# Patient Record
Sex: Male | Born: 1935 | Race: White | Hispanic: No | Marital: Married | Smoking: Former smoker
Health system: Southern US, Community
[De-identification: ages and names within clinical notes are randomized; demographics above are authoritative.]

## PROBLEM LIST (undated history)

## (undated) DIAGNOSIS — N200 Calculus of kidney: Secondary | ICD-10-CM

## (undated) DIAGNOSIS — Z950 Presence of cardiac pacemaker: Secondary | ICD-10-CM

## (undated) DIAGNOSIS — C61 Malignant neoplasm of prostate: Secondary | ICD-10-CM

## (undated) DIAGNOSIS — I2699 Other pulmonary embolism without acute cor pulmonale: Secondary | ICD-10-CM

## (undated) DIAGNOSIS — R000 Tachycardia, unspecified: Secondary | ICD-10-CM

## (undated) DIAGNOSIS — C419 Malignant neoplasm of bone and articular cartilage, unspecified: Secondary | ICD-10-CM

## (undated) DIAGNOSIS — I10 Essential (primary) hypertension: Secondary | ICD-10-CM

## (undated) DIAGNOSIS — K5790 Diverticulosis of intestine, part unspecified, without perforation or abscess without bleeding: Secondary | ICD-10-CM

## (undated) DIAGNOSIS — K2100 Gastro-esophageal reflux disease with esophagitis, without bleeding: Secondary | ICD-10-CM

## (undated) DIAGNOSIS — I4891 Unspecified atrial fibrillation: Secondary | ICD-10-CM

## (undated) DIAGNOSIS — I480 Paroxysmal atrial fibrillation: Secondary | ICD-10-CM

## (undated) DIAGNOSIS — J309 Allergic rhinitis, unspecified: Secondary | ICD-10-CM

## (undated) DIAGNOSIS — C4490 Unspecified malignant neoplasm of skin, unspecified: Secondary | ICD-10-CM

## (undated) DIAGNOSIS — J449 Chronic obstructive pulmonary disease, unspecified: Secondary | ICD-10-CM

## (undated) DIAGNOSIS — I5030 Unspecified diastolic (congestive) heart failure: Secondary | ICD-10-CM

## (undated) DIAGNOSIS — I714 Abdominal aortic aneurysm, without rupture, unspecified: Secondary | ICD-10-CM

## (undated) DIAGNOSIS — I495 Sick sinus syndrome: Secondary | ICD-10-CM

## (undated) DIAGNOSIS — N400 Enlarged prostate without lower urinary tract symptoms: Secondary | ICD-10-CM

## (undated) DIAGNOSIS — N183 Chronic kidney disease, stage 3 unspecified: Secondary | ICD-10-CM

## (undated) DIAGNOSIS — D131 Benign neoplasm of stomach: Secondary | ICD-10-CM

## (undated) DIAGNOSIS — I2510 Atherosclerotic heart disease of native coronary artery without angina pectoris: Secondary | ICD-10-CM

## (undated) DIAGNOSIS — R001 Bradycardia, unspecified: Secondary | ICD-10-CM

## (undated) DIAGNOSIS — K635 Polyp of colon: Secondary | ICD-10-CM

## (undated) DIAGNOSIS — G4730 Sleep apnea, unspecified: Secondary | ICD-10-CM

## (undated) DIAGNOSIS — K2970 Gastritis, unspecified, without bleeding: Secondary | ICD-10-CM

## (undated) DIAGNOSIS — E7800 Pure hypercholesterolemia, unspecified: Secondary | ICD-10-CM

## (undated) DIAGNOSIS — N189 Chronic kidney disease, unspecified: Secondary | ICD-10-CM

## (undated) DIAGNOSIS — G459 Transient cerebral ischemic attack, unspecified: Secondary | ICD-10-CM

## (undated) DIAGNOSIS — K2270 Barrett's esophagus without dysplasia: Secondary | ICD-10-CM

## (undated) DIAGNOSIS — K219 Gastro-esophageal reflux disease without esophagitis: Secondary | ICD-10-CM

## (undated) HISTORY — DX: Chronic kidney disease, stage 3 unspecified: N18.30

## (undated) HISTORY — DX: Bradycardia, unspecified: R00.1

## (undated) HISTORY — DX: Benign prostatic hyperplasia without lower urinary tract symptoms: N40.0

## (undated) HISTORY — DX: Presence of cardiac pacemaker: Z95.0

## (undated) HISTORY — DX: Abdominal aortic aneurysm, without rupture, unspecified: I71.40

## (undated) HISTORY — DX: Other pulmonary embolism without acute cor pulmonale: I26.99

## (undated) HISTORY — DX: Tachycardia, unspecified: R00.0

## (undated) HISTORY — DX: Paroxysmal atrial fibrillation: I48.0

## (undated) HISTORY — DX: Unspecified diastolic (congestive) heart failure: I50.30

## (undated) HISTORY — DX: Sick sinus syndrome: I49.5

## (undated) HISTORY — PX: CORONARY ANGIOPLASTY: SHX604

## (undated) HISTORY — PX: PROSTATE BIOPSY: SHX241

## (undated) HISTORY — PX: OTHER SURGICAL HISTORY: SHX169

## (undated) HISTORY — PX: CARDIAC CATHETERIZATION: SHX172

---

## 2001-03-21 ENCOUNTER — Inpatient Hospital Stay (HOSPITAL_COMMUNITY): Admission: AD | Admit: 2001-03-21 | Discharge: 2001-03-22 | Payer: Self-pay | Admitting: Cardiology

## 2005-05-18 ENCOUNTER — Other Ambulatory Visit: Payer: Self-pay

## 2005-05-18 ENCOUNTER — Observation Stay: Payer: Self-pay | Admitting: Cardiovascular Disease

## 2005-08-29 ENCOUNTER — Ambulatory Visit: Payer: Self-pay | Admitting: Gastroenterology

## 2006-07-02 ENCOUNTER — Ambulatory Visit: Payer: Self-pay | Admitting: Otolaryngology

## 2009-11-02 ENCOUNTER — Ambulatory Visit: Payer: Self-pay | Admitting: Family Medicine

## 2009-11-10 ENCOUNTER — Ambulatory Visit: Payer: Self-pay | Admitting: Cardiovascular Disease

## 2009-12-02 ENCOUNTER — Ambulatory Visit: Payer: Self-pay | Admitting: Cardiovascular Disease

## 2009-12-21 ENCOUNTER — Ambulatory Visit: Payer: Self-pay | Admitting: Surgery

## 2009-12-24 ENCOUNTER — Observation Stay: Payer: Self-pay | Admitting: Internal Medicine

## 2010-07-08 ENCOUNTER — Ambulatory Visit: Payer: Self-pay | Admitting: Internal Medicine

## 2011-03-07 HISTORY — PX: EYE SURGERY: SHX253

## 2011-05-17 ENCOUNTER — Ambulatory Visit: Payer: Self-pay | Admitting: Ophthalmology

## 2011-05-30 ENCOUNTER — Ambulatory Visit: Payer: Self-pay | Admitting: Ophthalmology

## 2011-07-04 ENCOUNTER — Ambulatory Visit: Payer: Self-pay | Admitting: Ophthalmology

## 2011-12-05 ENCOUNTER — Emergency Department: Payer: Self-pay | Admitting: Emergency Medicine

## 2011-12-05 LAB — COMPREHENSIVE METABOLIC PANEL
Albumin: 3.9 g/dL (ref 3.4–5.0)
Alkaline Phosphatase: 55 U/L (ref 50–136)
Anion Gap: 12 (ref 7–16)
Bilirubin,Total: 0.4 mg/dL (ref 0.2–1.0)
Calcium, Total: 9 mg/dL (ref 8.5–10.1)
Chloride: 108 mmol/L — ABNORMAL HIGH (ref 98–107)
Creatinine: 1.18 mg/dL (ref 0.60–1.30)
EGFR (African American): >60
EGFR (Non-African Amer.): >60
Glucose: 106 mg/dL — ABNORMAL HIGH (ref 65–99)
Osmolality: 285 (ref 275–301)
Potassium: 3.9 mmol/L (ref 3.5–5.1)
Sodium: 142 mmol/L (ref 136–145)

## 2011-12-05 LAB — URINALYSIS, COMPLETE
Bacteria: NONE SEEN
Glucose,UR: NEGATIVE mg/dL (ref 0–75)
Leukocyte Esterase: NEGATIVE
Nitrite: NEGATIVE
Protein: NEGATIVE
RBC,UR: 1 /HPF (ref 0–5)
Specific Gravity: 1.009 (ref 1.003–1.030)
WBC UR: NONE SEEN /HPF (ref 0–5)

## 2011-12-05 LAB — CBC
HCT: 39.3 % — ABNORMAL LOW (ref 40.0–52.0)
Platelet: 193 10*3/uL (ref 150–440)
RDW: 13.4 % (ref 11.5–14.5)
WBC: 11.2 10*3/uL — ABNORMAL HIGH (ref 3.8–10.6)

## 2012-07-10 ENCOUNTER — Ambulatory Visit: Payer: Self-pay | Admitting: Family Medicine

## 2013-09-02 DIAGNOSIS — Z85828 Personal history of other malignant neoplasm of skin: Secondary | ICD-10-CM | POA: Insufficient documentation

## 2013-09-15 ENCOUNTER — Inpatient Hospital Stay: Payer: Self-pay | Admitting: Family Medicine

## 2013-09-15 LAB — PRO B NATRIURETIC PEPTIDE: B-Type Natriuretic Peptide: 36 pg/mL (ref 0–450)

## 2013-09-15 LAB — COMPREHENSIVE METABOLIC PANEL
ALK PHOS: 44 U/L — AB
ANION GAP: 10 (ref 7–16)
Albumin: 3.5 g/dL (ref 3.4–5.0)
BUN: 18 mg/dL (ref 7–18)
Bilirubin,Total: 0.5 mg/dL (ref 0.2–1.0)
CALCIUM: 8.9 mg/dL (ref 8.5–10.1)
CHLORIDE: 108 mmol/L — AB (ref 98–107)
CO2: 23 mmol/L (ref 21–32)
Creatinine: 1.31 mg/dL — ABNORMAL HIGH (ref 0.60–1.30)
EGFR (African American): >60
EGFR (Non-African Amer.): 53 — ABNORMAL LOW
GLUCOSE: 136 mg/dL — AB (ref 65–99)
Osmolality: 285 (ref 275–301)
Potassium: 3.9 mmol/L (ref 3.5–5.1)
SGOT(AST): 37 U/L (ref 15–37)
SGPT (ALT): 28 U/L (ref 12–78)
Sodium: 141 mmol/L (ref 136–145)
TOTAL PROTEIN: 6.7 g/dL (ref 6.4–8.2)

## 2013-09-15 LAB — CBC
HCT: 39.3 % — ABNORMAL LOW (ref 40.0–52.0)
HGB: 13.2 g/dL (ref 13.0–18.0)
MCH: 29.4 pg (ref 26.0–34.0)
MCHC: 33.5 g/dL (ref 32.0–36.0)
MCV: 88 fL (ref 80–100)
PLATELETS: 173 10*3/uL (ref 150–440)
RBC: 4.48 10*6/uL (ref 4.40–5.90)
RDW: 13.6 % (ref 11.5–14.5)
WBC: 5.5 10*3/uL (ref 3.8–10.6)

## 2013-09-15 LAB — TROPONIN I
Troponin-I: <0.02 ng/mL
Troponin-I: <0.02 ng/mL
Troponin-I: <0.02 ng/mL

## 2013-09-15 LAB — APTT: Activated PTT: 35.4 secs (ref 23.6–35.9)

## 2013-09-15 LAB — LIPASE, BLOOD: Lipase: 241 U/L (ref 73–393)

## 2013-09-16 LAB — CBC WITH DIFFERENTIAL/PLATELET
Basophil #: 0 10*3/uL (ref 0.0–0.1)
Basophil %: 0.7 %
Eosinophil #: 0.2 10*3/uL (ref 0.0–0.7)
Eosinophil %: 2.9 %
HCT: 37 % — ABNORMAL LOW (ref 40.0–52.0)
HGB: 12.4 g/dL — ABNORMAL LOW (ref 13.0–18.0)
LYMPHS PCT: 22.6 %
Lymphocyte #: 1.2 10*3/uL (ref 1.0–3.6)
MCH: 29.5 pg (ref 26.0–34.0)
MCHC: 33.5 g/dL (ref 32.0–36.0)
MCV: 88 fL (ref 80–100)
Monocyte #: 0.6 x10 3/mm (ref 0.2–1.0)
Monocyte %: 11 %
NEUTROS ABS: 3.3 10*3/uL (ref 1.4–6.5)
NEUTROS PCT: 62.8 %
PLATELETS: 150 10*3/uL (ref 150–440)
RBC: 4.21 10*6/uL — AB (ref 4.40–5.90)
RDW: 13.8 % (ref 11.5–14.5)
WBC: 5.3 10*3/uL (ref 3.8–10.6)

## 2013-09-16 LAB — BASIC METABOLIC PANEL
Anion Gap: 7 (ref 7–16)
BUN: 20 mg/dL — ABNORMAL HIGH (ref 7–18)
CHLORIDE: 109 mmol/L — AB (ref 98–107)
Calcium, Total: 8.3 mg/dL — ABNORMAL LOW (ref 8.5–10.1)
Co2: 24 mmol/L (ref 21–32)
Creatinine: 1.28 mg/dL (ref 0.60–1.30)
EGFR (African American): >60
EGFR (Non-African Amer.): 54 — ABNORMAL LOW
GLUCOSE: 101 mg/dL — AB (ref 65–99)
Osmolality: 282 (ref 275–301)
Potassium: 4.2 mmol/L (ref 3.5–5.1)
Sodium: 140 mmol/L (ref 136–145)

## 2013-09-16 LAB — LIPID PANEL
Cholesterol: 103 mg/dL (ref 0–200)
HDL: 30 mg/dL — AB (ref 40–60)
Ldl Cholesterol, Calc: 54 mg/dL (ref 0–100)
Triglycerides: 95 mg/dL (ref 0–200)
VLDL CHOLESTEROL, CALC: 19 mg/dL (ref 5–40)

## 2013-10-02 DIAGNOSIS — Z955 Presence of coronary angioplasty implant and graft: Secondary | ICD-10-CM | POA: Insufficient documentation

## 2013-10-10 ENCOUNTER — Emergency Department: Payer: Self-pay | Admitting: Emergency Medicine

## 2013-10-21 ENCOUNTER — Ambulatory Visit: Payer: Self-pay | Admitting: Gastroenterology

## 2013-10-21 DIAGNOSIS — K2270 Barrett's esophagus without dysplasia: Secondary | ICD-10-CM

## 2013-10-21 HISTORY — DX: Barrett's esophagus without dysplasia: K22.70

## 2013-10-22 LAB — PATHOLOGY REPORT

## 2014-02-12 DIAGNOSIS — J00 Acute nasopharyngitis [common cold]: Secondary | ICD-10-CM | POA: Insufficient documentation

## 2014-06-18 DIAGNOSIS — Z7189 Other specified counseling: Secondary | ICD-10-CM | POA: Insufficient documentation

## 2014-06-27 NOTE — Consult Note (Signed)
PATIENT NAME:  Nathaniel Thompson, Nathaniel Thompson MR#:  [id number] DATE OF BIRTH:  1935/05/16  DATE OF CONSULTATION:  09/15/2013.  CONSULTING PHYSICIAN:  Dionisio David, MD  INDICATION FOR CONSULTATION: Chest pain.   HISTORY OF PRESENT ILLNESS: This is a 79 year old white male with a past medical history of PCI and stenting at [facility] in 2004.  He presented with all of a sudden severe pressure-type chest pain this morning associated with shortness of breath and diaphoresis. He was given IV nitroglycerin and sublingual nitroglycerin. The chest pain has resolved. He is feeling much better. He is currently on 5 mcg of IV nitroglycerin. He felt there was also some belching and some epigastric pain, along with feeling like he had gas, but all these symptoms have resolved right now. There was concern because he had had PCI and stenting in 2004. Currently looks quite stable.   PAST MEDICAL HISTORY: As mentioned, he has history of PCI and stenting, history of hypertension and history of hyperlipidemia and GI reflux.   SOCIAL HISTORY: Unremarkable.   FAMILY HISTORY: Positive for coronary artery disease.   MEDICATIONS: Simvastatin 20 mg, Protonix once a day, clopidogrel 75 mg once a day, aspirin 81 mg once a day.   PHYSICAL EXAMINATION:  GENERAL: He is alert, oriented x3, in no acute distress right now.  VITAL SIGNS: Blood pressure is 117/65, respirations 18, pulse 53, temperature 98.4.  HEENT: No carotid bruit. No significant JVD.  LUNGS: Good air entry. No rales or rhonchi.  HEART: Regular rate and rhythm, bradycardic. No audible murmur.  ABDOMEN: Soft, nontender, positive bowel sounds.  EXTREMITIES: No pedal edema.  NEUROLOGIC: The patient appears to be intact.   EKG shows sinus rhythm with sinus bradycardia, 56 beats per minute, nonspecific ST-T changes.   LABORATORY DATA: First troponin is 0.02. BUN 1.31, creatinine 1.31, BUN 18, potassium is normal.   ASSESSMENT AND PLAN: The patient has  atypical chest pain. He was given a GI cocktail along with Protonix, myocardial infarction has been ruled out with troponin being negative. EKG unremarkable. Advise discontinuation of IV nitroglycerin, transfer to telemetry, put him on 1 inch of Nitro Paste q.6 hours, cycle the cardiac enzymes 3 times.  Right now there is no indication to do cardiac catheterization. Advise changing Protonix to twice a day. We will see how he is doing in the morning. He may need an outpatient stress test.   Thank you very much for the referral    ____________________________ Dionisio David, MD sak:lt D: 09/15/2013 12:56:54 ET T: 09/15/2013 14:23:31 ET JOB#: [id number]  cc: Dionisio David, MD, <Dictator> Dionisio David MD ELECTRONICALLY SIGNED 09/17/2013 9:22

## 2014-06-27 NOTE — Discharge Summary (Signed)
PATIENT NAME:  Nathaniel Thompson, Nathaniel Thompson MR#:  [id number] DATE OF BIRTH:  23-Aug-1935  DATE OF ADMISSION:  09/15/2013 DATE OF DISCHARGE:  09/16/2013  DISCHARGE DIAGNOSES:  1.  Atypical chest pain.  2.  History of coronary artery disease.  3.  Hypertension.   DISCHARGE MEDICATIONS:  1.  Aspirin 81 mg p.o. daily.  2.  Vytorin 10/20 one tab p.o. daily.  3.  Amlodipine 5 mg p.o. daily.  4.  Benazepril 40 mg p.o. daily.  5.  Flonase 50 mcg 2 sprays each nostril daily as needed.  6.  Multivitamin 1 tab p.o. daily.  7.  Plavix 75 mg p.o. daily.  8.  Nitroglycerin 0.4 mg sublingual q. 5 minutes as needed for chest pain.  9.  Cephalexin 500 mg p.o. b.i.d. as directed. 10.  Pantoprazole 40 mg p.o. b.i.d.  11.  The patient may also take Zantac 150 mg p.o. p.r.n. daily as needed for breakthrough reflux.   CONSULTS:  Dr. Humphrey Rolls, Cardiology.   PROCEDURES: None.   PERTINENT LABS AND STUDIES PRIOR TO DISCHARGE: Sodium 140, potassium 4.2, creatinine 1.28, glucose of 101, LDL 54. Troponins were negative x 3. White blood cell count 5.3, hemoglobin 12.4, and platelets 150,000.    Chest x-ray showed no acute processes.   BRIEF HOSPITAL COURSE:  1.  Atypical chest pain: The patient initially came in complaining of chest discomfort with history of coronary artery disease and PCI done in 2004. His cardiac enzymes were negative. His EKG showed no acute changes. His symptoms improved with a GI cocktail and nitroglycerin.  This is less likely cardiac in nature, but more likely GI-related.  Possibly control reflux versus esophageal vasospasms.  I am going to recommend that he get established with Dr. Rayann Heman at [facility] per outpatient therapy and evaluation. He also has follow-up with Dr. Humphrey Rolls tomorrow a 8:30 for a Cardiolite stress test, given his history of coronary artery disease.   2.  Other chronic issues are stable at this time. Continue his home regimen. He is coming n.p.o. after midnight for the procedure tomorrow.    DISPOSITION: He is stable condition to be discharged to home. Follow-up with Dr. Humphrey Rolls tomorrow as planned, follow up with Dr. Netty Starring within 10 days, and will need a referral to Dr. Rayann Heman as an outpatient possibly later this week.    ____________________________ Dion Body, MD kl:ts D: 09/16/2013 10:28:14 ET T: 09/16/2013 11:17:29 ET JOB#: [id number]  cc: Dion Body, MD, <Dictator> Dion Body MD ELECTRONICALLY SIGNED 09/28/2013 8:06

## 2014-06-27 NOTE — H&P (Signed)
PATIENT NAME:  Nathaniel Thompson, Nathaniel Thompson MR#:  [id number] DATE OF BIRTH:  1935/07/21  DATE OF ADMISSION:  09/15/2013  PRIMARY CARE PHYSICIAN:  Dion Body, MD  CARDIOLOGIST: Dionisio David, MD  CHIEF COMPLAINT: Chest pain.   HISTORY OF PRESENT ILLNESS: This is a 79 year old man who woke up in the morning hurting in his chest. He did belch quite a bit. It got worse and worse, a pressure feeling, 7 out of 10 in intensity, radiating to his back. No radiation to the neck or arm. He was sweating and clammy, also short of breath and nauseous. EMS gave him aspirin and nitroglycerin. In the ER, he received morphine and nitroglycerin patch and then was started on a nitroglycerin drip which relieved his pain. Currently, he had no pain. Hospitalist services were contacted for further evaluation.   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   PAST SURGICAL HISTORY: Skin cancer on the nose and arm removed, cataract surgery.   ALLERGIES: MEDICATION FOR ALLERGIES.   MEDICATIONS: Include aspirin 81 mg daily, Norvasc 5 mg daily, benazepril 40 mg daily, cephalexin 500 mg twice a day, Plavix 75 mg daily, Flonase 50 mcg/inhalations 2 sprays nasally once a day, multivitamin 1 tablet daily, nitroglycerin p.r.n., Protonix 40 mg daily, Vytorin 10/40, he does use an albuterol inhaler.   SOCIAL HISTORY: No smoking. No alcohol. No drug use. 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   FAMILY HISTORY: Father died of multiple myeloma. Mother died of complications. She did have lung cancer, status post lobectomy and aneurysm surgery. Brother died of leukemia. A sister has CLL. Brother living with a tumor on the kidney, lung cancer and chronic obstructive pulmonary disease.   REVIEW OF SYSTEMS:  CONSTITUTIONAL: Positive for sweats. No fever. No chills.  Fatigues easily.  EYES: He wears reading glasses.  EARS, NOSE, MOUTH AND THROAT: Decreased hearing and wears hearing aids. No sore throat. No difficulty swallowing.  CARDIOVASCULAR: Positive for chest pain. No palpitations.  RESPIRATORY: Positive for shortness of breath. Positive for cough. No sputum. No hemoptysis.  GASTROINTESTINAL: Positive for nausea. No vomiting. No abdominal pain. No diarrhea. No constipation. No bright red blood per rectum. No melena.  GENITOURINARY: No burning on urination. No hematuria.  MUSCULOSKELETAL: Positive for joint pain.  INTEGUMENTARY: Positive for itching on the left shoulder blade occasionally.  PSYCHIATRIC: No anxiety or depression.  ENDOCRINE: No thyroid problems.  HEMATOLOGIC AND LYMPHATIC: No anemia, no easy bruising or bleeding.   PHYSICAL EXAMINATION: VITAL SIGNS: On presentation, temperature of 98.5, pulse 56, respirations 20, blood pressure 119/69, pulse oximetry 100% on room air.  GENERAL: No respiratory distress.  EYES: Conjunctivae and lids normal. Pupils equal, round and reactive to light. Extraocular muscles intact. No nystagmus.  EARS, NOSE, MOUTH AND THROAT: Tympanic membranes: No erythema. Nasal mucosa: No erythema. Throat: No erythema, no exudate seen. Lips and gums: No lesions.  NECK: No JVD. No bruits. No lymphadenopathy. No thyromegaly. No thyroid nodules palpated.  RESPIRATORY:  Lungs clear to auscultation. No use of accessory muscles to breathe. No rhonchi, rales or wheeze heard.  CARDIOVASCULAR: S1, S2 normal. No gallops, rubs or murmurs heard. Carotid upstroke 2+ bilaterally. No bruits. Dorsalis pedis pulses 2+ bilaterally. No edema of the lower extremity.  ABDOMEN: Soft, nontender. No organomegaly/splenomegaly. Normoactive bowel sounds. No masses felt.  LYMPHATIC: No lymph nodes in  the neck.  MUSCULOSKELETAL: No clubbing, edema or cyanosis.  SKIN: No rashes or ulcers seen.  NEUROLOGIC: Cranial nerves II through XII grossly intact.  Deep tendon reflexes 2+ bilateral lower extremities.  PSYCHIATRIC: The patient is oriented to person, place and time.   LABORATORY, DIAGNOSTIC AND RADIOLOGICAL DATA:  Troponin negative. White blood cell count 5.5, hemoglobin and hematocrit 13.2 and 39.3, platelet count 173. Glucose 136, BUN 18, creatinine 1.31, sodium 141, potassium 3.9, chloride 108, CO2 23, calcium 8.9. Liver function tests normal range. BNP 36, lipase 241. Chest x-ray no acute abnormalities.   ASSESSMENT AND PLAN: 1.  Unstable angina with chest pain relieved with nitroglycerin drip. Since the patient is on a nitroglycerin drip, I will have to admit to the Critical Care Unit. I will get serial cardiac enzymes to rule out myocardial infarction, put on telemetry. ER physician spoke with Dr. Humphrey Rolls for consideration for cardiac catheterization.  I did give the patient the gastrointestinal cocktail and continued Protonix just in case this pain is gastrointestinal in nature. No beta blocker with relative bradycardia. The patient is already on Vytorin, aspirin and Plavix.  2.  Hypertension. Blood pressure is stable on the nitroglycerin drip. Will hold Norvasc and benazepril at this point in time.  3.  Gastroesophageal reflux disease, on Protonix.  4.  Recent skin cancer surgery, on cephalexin, will continue.   TIME SPENT ON DISCHARGE: 55 minutes. The patient is a DO NOT RESUSCITATE.    ____________________________ Tana Conch. Leslye Peer, MD rjw:cs D: 09/15/2013 15:33:25 ET T: 09/15/2013 15:52:13 ET JOB#: [id number]  cc: Tana Conch. Leslye Peer, MD, <Dictator> Marisue Brooklyn MD ELECTRONICALLY SIGNED 09/18/2013 12:40

## 2014-06-28 NOTE — Op Note (Signed)
PATIENT NAME:  Nathaniel Thompson, Nathaniel Thompson MR#:  [id number] DATE OF BIRTH:  [birth year]  DATE OF PROCEDURE:  05/30/2011  PREOPERATIVE DIAGNOSIS: Visually significant cataract of the left eye.   POSTOPERATIVE DIAGNOSIS: Visually significant cataract of the left eye.   OPERATIVE PROCEDURE: Cataract extraction by phacoemulsification with implant of intraocular lens to left eye.   SURGEON: Birder Robson, MD.   ANESTHESIA:  1. Managed anesthesia care.  2. Topical tetracaine drops followed by 2% Xylocaine jelly applied in the preoperative holding area.   COMPLICATIONS: None.   TECHNIQUE:  Stop and chop.   DESCRIPTION OF PROCEDURE: The patient was examined and consented in the preoperative holding area where the aforementioned topical anesthesia was applied to the left eye and then brought back to the Operating Room where the left eye was prepped and draped in the usual sterile ophthalmic fashion and a lid speculum was placed. A paracentesis was created with the side port blade and the anterior chamber was filled with viscoelastic. A near clear corneal incision was performed with the steel keratome. A continuous curvilinear capsulorrhexis was performed with a cystotome followed by the capsulorrhexis forceps. Hydrodissection and hydrodelineation were carried out with BSS on a blunt cannula. The lens was removed in a stop and chop technique and the remaining cortical material was removed with the irrigation-aspiration handpiece. The capsular bag was inflated with viscoelastic and the Tecnis ZCB00 21.5-diopter lens, serial number [id number] was placed in the capsular bag without complication. The remaining viscoelastic was removed from the eye with the irrigation-aspiration handpiece. The wounds were hydrated. The anterior chamber was flushed with Miostat and the eye was inflated to physiologic pressure. The wounds were found to be water tight. The eye was dressed with Vigamox. The patient was given protective  glasses to wear throughout the day and a shield with which to sleep tonight. The patient was also given drops with which to begin a drop regimen today and will follow-up with me in one day.  ____________________________ Livingston Diones. Breionna Punt, MD wlp:slb D: 05/30/2011 12:25:45 ET T: 05/30/2011 13:11:34 ET JOB#: [id number]  cc: Irish Piech L. Sheyanne Munley, MD, <Dictator> Livingston Diones Lynix Bonine MD ELECTRONICALLY SIGNED 06/09/2011 20:56

## 2014-06-28 NOTE — Op Note (Signed)
PATIENT NAME:  Nathaniel Thompson, Nathaniel Thompson MR#:  [id number] DATE OF BIRTH:  Oct 09, 1935  DATE OF PROCEDURE:  07/04/2011  PREOPERATIVE DIAGNOSIS: Visually significant cataract of the right eye.   POSTOPERATIVE DIAGNOSIS: Visually significant cataract of the right eye.   OPERATIVE PROCEDURE: Cataract extraction by phacoemulsification with implant of intraocular lens to right eye.   SURGEON: Birder Robson, MD.   ANESTHESIA:  1. Managed anesthesia care.  2. Topical tetracaine drops followed by 2% Xylocaine jelly applied in the preoperative holding area.   COMPLICATIONS: None.   TECHNIQUE:  Stop and chop.   DESCRIPTION OF PROCEDURE: The patient was examined and consented in the preoperative holding area where the aforementioned topical anesthesia was applied to the right eye and then brought back to the Operating Room where the right eye was prepped and draped in the usual sterile ophthalmic fashion and a lid speculum was placed. A paracentesis was created with the side port blade and the anterior chamber was filled with viscoelastic. A near clear corneal incision was performed with the steel keratome. A continuous curvilinear capsulorrhexis was performed with a cystotome followed by the capsulorrhexis forceps. Hydrodissection and hydrodelineation were carried out with BSS on a blunt cannula. The lens was removed in a stop and chop technique and the remaining cortical material was removed with the irrigation-aspiration handpiece. The capsular bag was inflated with viscoelastic and the Technis ZCB00 22.5-diopter lens, serial number [id number] was placed in the capsular bag without complication. The remaining viscoelastic was removed from the eye with the irrigation-aspiration handpiece. The wounds were hydrated. The anterior chamber was flushed with Miostat and the eye was inflated to physiologic pressure. The wounds were found to be water tight. The eye was dressed with Vigamox. The patient was given protective  glasses to wear throughout the day and a shield with which to sleep tonight. The patient was also given drops with which to begin a drop regimen today and will follow-up with me in one day.   ____________________________ Livingston Diones. Jones Viviani, MD wlp:cms D: 07/04/2011 12:23:45 ET T: 07/04/2011 12:45:06 ET JOB#: [id number]  cc: Loyal Rudy L. Tagen Milby, MD, <Dictator> Livingston Diones Islah Eve MD ELECTRONICALLY SIGNED 07/11/2011 13:34

## 2015-03-11 DIAGNOSIS — I340 Nonrheumatic mitral (valve) insufficiency: Secondary | ICD-10-CM | POA: Diagnosis not present

## 2015-03-11 DIAGNOSIS — I2510 Atherosclerotic heart disease of native coronary artery without angina pectoris: Secondary | ICD-10-CM | POA: Diagnosis not present

## 2015-03-11 DIAGNOSIS — E785 Hyperlipidemia, unspecified: Secondary | ICD-10-CM | POA: Diagnosis not present

## 2015-03-11 DIAGNOSIS — G4730 Sleep apnea, unspecified: Secondary | ICD-10-CM | POA: Diagnosis not present

## 2015-03-11 DIAGNOSIS — I10 Essential (primary) hypertension: Secondary | ICD-10-CM | POA: Diagnosis not present

## 2015-04-14 DIAGNOSIS — G4733 Obstructive sleep apnea (adult) (pediatric): Secondary | ICD-10-CM | POA: Diagnosis not present

## 2015-04-26 DIAGNOSIS — H26492 Other secondary cataract, left eye: Secondary | ICD-10-CM | POA: Diagnosis not present

## 2015-05-13 DIAGNOSIS — H26491 Other secondary cataract, right eye: Secondary | ICD-10-CM | POA: Diagnosis not present

## 2015-06-15 DIAGNOSIS — I10 Essential (primary) hypertension: Secondary | ICD-10-CM | POA: Diagnosis not present

## 2015-06-15 DIAGNOSIS — Z125 Encounter for screening for malignant neoplasm of prostate: Secondary | ICD-10-CM | POA: Diagnosis not present

## 2015-06-15 DIAGNOSIS — E7800 Pure hypercholesterolemia, unspecified: Secondary | ICD-10-CM | POA: Diagnosis not present

## 2015-06-15 DIAGNOSIS — R7303 Prediabetes: Secondary | ICD-10-CM | POA: Diagnosis not present

## 2015-06-22 ENCOUNTER — Other Ambulatory Visit: Payer: Self-pay | Admitting: Family Medicine

## 2015-06-22 DIAGNOSIS — Z87891 Personal history of nicotine dependence: Secondary | ICD-10-CM | POA: Diagnosis not present

## 2015-06-22 DIAGNOSIS — Z23 Encounter for immunization: Secondary | ICD-10-CM | POA: Diagnosis not present

## 2015-06-22 DIAGNOSIS — Z0000 Encounter for general adult medical examination without abnormal findings: Secondary | ICD-10-CM | POA: Diagnosis not present

## 2015-06-22 DIAGNOSIS — E7800 Pure hypercholesterolemia, unspecified: Secondary | ICD-10-CM | POA: Diagnosis not present

## 2015-06-22 DIAGNOSIS — Z136 Encounter for screening for cardiovascular disorders: Secondary | ICD-10-CM

## 2015-06-24 ENCOUNTER — Ambulatory Visit
Admission: RE | Admit: 2015-06-24 | Discharge: 2015-06-24 | Disposition: A | Discharge status: Home / Self Care | Payer: Medicare HMO | Source: Ambulatory Visit | Attending: Family Medicine | Admitting: Family Medicine

## 2015-06-24 DIAGNOSIS — R1907 Generalized intra-abdominal and pelvic swelling, mass and lump: Secondary | ICD-10-CM | POA: Diagnosis not present

## 2015-06-24 DIAGNOSIS — Z136 Encounter for screening for cardiovascular disorders: Secondary | ICD-10-CM | POA: Diagnosis not present

## 2015-07-13 DIAGNOSIS — Z1211 Encounter for screening for malignant neoplasm of colon: Secondary | ICD-10-CM | POA: Diagnosis not present

## 2015-07-13 DIAGNOSIS — K2270 Barrett's esophagus without dysplasia: Secondary | ICD-10-CM | POA: Diagnosis not present

## 2015-07-21 DIAGNOSIS — L570 Actinic keratosis: Secondary | ICD-10-CM | POA: Diagnosis not present

## 2015-07-21 DIAGNOSIS — Z1283 Encounter for screening for malignant neoplasm of skin: Secondary | ICD-10-CM | POA: Diagnosis not present

## 2015-07-21 DIAGNOSIS — Z08 Encounter for follow-up examination after completed treatment for malignant neoplasm: Secondary | ICD-10-CM | POA: Diagnosis not present

## 2015-07-21 DIAGNOSIS — Z85828 Personal history of other malignant neoplasm of skin: Secondary | ICD-10-CM | POA: Diagnosis not present

## 2015-08-26 DIAGNOSIS — L03031 Cellulitis of right toe: Secondary | ICD-10-CM | POA: Diagnosis not present

## 2015-09-09 DIAGNOSIS — L03031 Cellulitis of right toe: Secondary | ICD-10-CM | POA: Diagnosis not present

## 2015-09-10 ENCOUNTER — Encounter: Payer: Self-pay | Admitting: *Deleted

## 2015-09-13 ENCOUNTER — Ambulatory Visit: Payer: Medicare HMO | Admitting: Anesthesiology

## 2015-09-13 ENCOUNTER — Encounter
Admission: RE | Disposition: A | Discharge status: Home / Self Care | Payer: Self-pay | Source: Ambulatory Visit | Attending: Gastroenterology

## 2015-09-13 ENCOUNTER — Ambulatory Visit
Admission: RE | Admit: 2015-09-13 | Discharge: 2015-09-13 | Disposition: A | Discharge status: Home / Self Care | Payer: Medicare HMO | Source: Ambulatory Visit | Attending: Gastroenterology | Admitting: Gastroenterology

## 2015-09-13 DIAGNOSIS — Z955 Presence of coronary angioplasty implant and graft: Secondary | ICD-10-CM | POA: Diagnosis not present

## 2015-09-13 DIAGNOSIS — Z1211 Encounter for screening for malignant neoplasm of colon: Secondary | ICD-10-CM | POA: Diagnosis not present

## 2015-09-13 DIAGNOSIS — I129 Hypertensive chronic kidney disease with stage 1 through stage 4 chronic kidney disease, or unspecified chronic kidney disease: Secondary | ICD-10-CM | POA: Diagnosis not present

## 2015-09-13 DIAGNOSIS — K5730 Diverticulosis of large intestine without perforation or abscess without bleeding: Secondary | ICD-10-CM | POA: Insufficient documentation

## 2015-09-13 DIAGNOSIS — K219 Gastro-esophageal reflux disease without esophagitis: Secondary | ICD-10-CM | POA: Diagnosis not present

## 2015-09-13 DIAGNOSIS — Z7902 Long term (current) use of antithrombotics/antiplatelets: Secondary | ICD-10-CM | POA: Insufficient documentation

## 2015-09-13 DIAGNOSIS — K635 Polyp of colon: Secondary | ICD-10-CM | POA: Insufficient documentation

## 2015-09-13 DIAGNOSIS — Z7982 Long term (current) use of aspirin: Secondary | ICD-10-CM | POA: Diagnosis not present

## 2015-09-13 DIAGNOSIS — E7800 Pure hypercholesterolemia, unspecified: Secondary | ICD-10-CM | POA: Insufficient documentation

## 2015-09-13 DIAGNOSIS — Z79899 Other long term (current) drug therapy: Secondary | ICD-10-CM | POA: Insufficient documentation

## 2015-09-13 DIAGNOSIS — N189 Chronic kidney disease, unspecified: Secondary | ICD-10-CM | POA: Insufficient documentation

## 2015-09-13 DIAGNOSIS — K2270 Barrett's esophagus without dysplasia: Secondary | ICD-10-CM | POA: Insufficient documentation

## 2015-09-13 DIAGNOSIS — Z8673 Personal history of transient ischemic attack (TIA), and cerebral infarction without residual deficits: Secondary | ICD-10-CM | POA: Insufficient documentation

## 2015-09-13 DIAGNOSIS — J449 Chronic obstructive pulmonary disease, unspecified: Secondary | ICD-10-CM | POA: Diagnosis not present

## 2015-09-13 DIAGNOSIS — I2510 Atherosclerotic heart disease of native coronary artery without angina pectoris: Secondary | ICD-10-CM | POA: Diagnosis not present

## 2015-09-13 DIAGNOSIS — Z7951 Long term (current) use of inhaled steroids: Secondary | ICD-10-CM | POA: Diagnosis not present

## 2015-09-13 DIAGNOSIS — E1122 Type 2 diabetes mellitus with diabetic chronic kidney disease: Secondary | ICD-10-CM | POA: Insufficient documentation

## 2015-09-13 DIAGNOSIS — K640 First degree hemorrhoids: Secondary | ICD-10-CM | POA: Insufficient documentation

## 2015-09-13 DIAGNOSIS — K5790 Diverticulosis of intestine, part unspecified, without perforation or abscess without bleeding: Secondary | ICD-10-CM | POA: Diagnosis not present

## 2015-09-13 DIAGNOSIS — G4730 Sleep apnea, unspecified: Secondary | ICD-10-CM | POA: Insufficient documentation

## 2015-09-13 DIAGNOSIS — K210 Gastro-esophageal reflux disease with esophagitis: Secondary | ICD-10-CM | POA: Diagnosis not present

## 2015-09-13 DIAGNOSIS — K449 Diaphragmatic hernia without obstruction or gangrene: Secondary | ICD-10-CM | POA: Diagnosis not present

## 2015-09-13 DIAGNOSIS — K648 Other hemorrhoids: Secondary | ICD-10-CM | POA: Diagnosis not present

## 2015-09-13 DIAGNOSIS — J309 Allergic rhinitis, unspecified: Secondary | ICD-10-CM | POA: Diagnosis not present

## 2015-09-13 DIAGNOSIS — I10 Essential (primary) hypertension: Secondary | ICD-10-CM | POA: Diagnosis not present

## 2015-09-13 DIAGNOSIS — Z85828 Personal history of other malignant neoplasm of skin: Secondary | ICD-10-CM | POA: Diagnosis not present

## 2015-09-13 DIAGNOSIS — D127 Benign neoplasm of rectosigmoid junction: Secondary | ICD-10-CM | POA: Diagnosis not present

## 2015-09-13 HISTORY — DX: Atherosclerotic heart disease of native coronary artery without angina pectoris: I25.10

## 2015-09-13 HISTORY — DX: Gastro-esophageal reflux disease without esophagitis: K21.9

## 2015-09-13 HISTORY — DX: Barrett's esophagus without dysplasia: K22.70

## 2015-09-13 HISTORY — DX: Sleep apnea, unspecified: G47.30

## 2015-09-13 HISTORY — DX: Allergic rhinitis, unspecified: J30.9

## 2015-09-13 HISTORY — DX: Transient cerebral ischemic attack, unspecified: G45.9

## 2015-09-13 HISTORY — PX: COLONOSCOPY WITH PROPOFOL: SHX5780

## 2015-09-13 HISTORY — PX: ESOPHAGOGASTRODUODENOSCOPY (EGD) WITH PROPOFOL: SHX5813

## 2015-09-13 HISTORY — DX: Pure hypercholesterolemia, unspecified: E78.00

## 2015-09-13 HISTORY — DX: Chronic obstructive pulmonary disease, unspecified: J44.9

## 2015-09-13 HISTORY — DX: Essential (primary) hypertension: I10

## 2015-09-13 HISTORY — DX: Chronic kidney disease, unspecified: N18.9

## 2015-09-13 SURGERY — COLONOSCOPY WITH PROPOFOL
Anesthesia: General

## 2015-09-13 MED ORDER — PROPOFOL 10 MG/ML IV BOLUS
INTRAVENOUS | Status: DC | PRN
  Administered 2015-09-13: 50 mg via INTRAVENOUS

## 2015-09-13 MED ORDER — KETAMINE HCL 50 MG/ML IJ SOLN
INTRAMUSCULAR | Status: DC | PRN
  Administered 2015-09-13: 15 mg via INTRAMUSCULAR

## 2015-09-13 MED ORDER — SODIUM CHLORIDE 0.9 % IV SOLN: INTRAVENOUS | Status: DC

## 2015-09-13 MED ORDER — PHENYLEPHRINE HCL 10 MG/ML IJ SOLN
INTRAMUSCULAR | Status: DC | PRN
  Administered 2015-09-13: 200 ug via INTRAVENOUS

## 2015-09-13 MED ORDER — PROPOFOL 500 MG/50ML IV EMUL
INTRAVENOUS | Status: DC | PRN
  Administered 2015-09-13: 140 ug/kg/min via INTRAVENOUS

## 2015-09-13 MED ORDER — GLYCOPYRROLATE 0.2 MG/ML IJ SOLN
INTRAMUSCULAR | Status: DC | PRN
  Administered 2015-09-13: .2 mg via INTRAVENOUS

## 2015-09-13 MED ORDER — EPHEDRINE SULFATE 50 MG/ML IJ SOLN
INTRAMUSCULAR | Status: DC | PRN
  Administered 2015-09-13: 10 mg via INTRAVENOUS

## 2015-09-13 MED ORDER — LIDOCAINE HCL (CARDIAC) 20 MG/ML IV SOLN
INTRAVENOUS | Status: DC | PRN
  Administered 2015-09-13: 60 mg via INTRAVENOUS

## 2015-09-13 MED ORDER — SODIUM CHLORIDE 0.9 % IV SOLN
INTRAVENOUS | Status: DC
  Administered 2015-09-13: 1000 mL via INTRAVENOUS

## 2015-09-13 MED ORDER — MIDAZOLAM HCL 2 MG/2ML IJ SOLN
INTRAMUSCULAR | Status: DC | PRN
  Administered 2015-09-13: 1 mg via INTRAVENOUS

## 2015-09-13 NOTE — H&P (Signed)
Outpatient short stay form Pre-procedure 09/13/2015 7:50 AM Lollie Sails MD  Primary Physician: Dr Dion Body  Reason for visit:  egd and colonoscopy  History of present illness:  Patient is a 80 yo male with a history of barretts esophagus, presenting for a egd and colonoscopy.  He tolerated his prep well, he has held plavix for 6 days and aspirin (81) for one day.       Current facility-administered medications:  .  0.9 %  sodium chloride infusion, , Intravenous, Continuous, Lollie Sails, MD, Last Rate: 20 mL/hr at 09/13/15 0701, 1,000 mL at 09/13/15 0701 .  0.9 %  sodium chloride infusion, , Intravenous, Continuous, Lollie Sails, MD  Facility-Administered Medications Ordered in Other Encounters:  .  glycopyrrolate (ROBINUL) injection, , Intravenous, Anesthesia Intra-op, Silvana Newness, CRNA, 0.2 mg at 09/13/15 [id number]  Prescriptions prior to admission  Medication Sig Dispense Refill Last Dose  . albuterol-ipratropium (COMBIVENT) 18-103 MCG/ACT inhaler Inhale 1 puff into the lungs every 4 (four) hours as needed for wheezing or shortness of breath.     [name] amLODipine (NORVASC) 5 MG tablet Take 5 mg by mouth daily.   09/13/2015 at 0600  . aspirin 81 MG tablet Take 81 mg by mouth daily.     . clopidogrel (PLAVIX) 75 MG tablet Take 75 mg by mouth daily.   09/07/2015 at Unknown time  . fluticasone (FLONASE) 50 MCG/ACT nasal spray Place 2 sprays into both nostrils daily.     . isosorbide mononitrate (IMDUR) 30 MG 24 hr tablet Take 30 mg by mouth daily.   09/13/2015 at 0600  . losartan (COZAAR) 50 MG tablet Take 50 mg by mouth daily.     [name] lovastatin (MEVACOR) 40 MG tablet Take 80 mg by mouth at bedtime.     . Multiple Vitamin (MULTIVITAMIN) tablet Take 1 tablet by mouth daily.     . nitroGLYCERIN (NITROSTAT) 0.4 MG SL tablet Place 0.4 mg under the tongue every 5 (five) minutes as needed for chest pain.     . pantoprazole (PROTONIX) 40 MG tablet Take 40 mg by mouth daily.         Allergies  Allergen Reactions  . Atorvastatin      Past Medical History  Diagnosis Date  . Allergic rhinitis   . Barrett's esophagus 10/21/13  . Diabetes mellitus without complication ([place])   . COPD (chronic obstructive pulmonary disease) ([place])   . Hypertension   . GERD (gastroesophageal reflux disease)   . Coronary artery disease   . Chronic kidney disease   . Hypercholesteremia   . Cancer ([place])     SKIN  . Sleep apnea   . TIA (transient ischemic attack)     Review of systems:      Physical Exam    Heart and lungs: rrr without rmg    HEENT: [place] at     Other:     Pertinant exam for procedure: soft, nontender, nondistended bowel sounds positive normoactive.     Planned proceedures: egd, colonoscopy and indicated proceedures. I have discussed the risks benefits and complications of procedures to include not limited to bleeding, infection, perforation and the risk of sedation and the patient wishes to proceed.  Lollie Sails, MD  7:53 AM 09/13/2015    Lollie Sails, MD Gastroenterology 09/13/2015  7:50 AM

## 2015-09-13 NOTE — Op Note (Signed)
[facility]astroenterology Patient Name: Nathaniel Thompson Procedure Date: 09/13/2015 7:36 AM MRN: [id number] Account #: [id number] Date of Birth: 01/25/1936 Admit Type: Outpatient Age: 80 Room: [facility] ENDO ROOM 1 Gender: Male Note Status: Finalized Procedure:            Colonoscopy Indications:          Screening for colorectal malignant neoplasm Providers:            Lollie Sails, MD Referring MD:         Dion Body (Referring MD) Medicines:            Monitored Anesthesia Care Complications:        No immediate complications. Procedure:            Pre-Anesthesia Assessment:                       - ASA Grade Assessment: III - A patient with severe                        systemic disease.                       After obtaining informed consent, the colonoscope was                        passed under direct vision. Throughout the procedure,                        the patient's blood pressure, pulse, and oxygen                        saturations were monitored continuously. The                        Colonoscope was introduced through the anus and                        advanced to the the cecum, identified by appendiceal                        orifice and ileocecal valve. The colonoscopy was                        performed with moderate difficulty. Successful                        completion of the procedure was aided by changing the                        patient to a prone position and using manual pressure.                        The patient tolerated the procedure well. The quality                        of the bowel preparation was good. Findings:      A 2 mm polyp was found in the recto-sigmoid colon. The polyp was       sessile. The polyp was removed with a cold biopsy forceps. Resection and       retrieval were complete.  Multiple medium-mouthed diverticula were found in the sigmoid colon and       descending colon.      Non-bleeding  internal hemorrhoids were found during retroflexion and       during anoscopy. The hemorrhoids were medium-sized and Grade I (internal       hemorrhoids that do not prolapse).      The exam was otherwise normal throughout the examined colon.      The digital rectal exam findings include non-thrombosed internal       hemorrhoids. Impression:           - One 2 mm polyp at the recto-sigmoid colon, removed                        with a cold biopsy forceps. Resected and retrieved.                       - Diverticulosis in the sigmoid colon and in the                        descending colon.                       - Non-bleeding internal hemorrhoids.                       - Non-thrombosed internal hemorrhoids found on digital                        rectal exam. Recommendation:       - Discharge patient to home.                       - Use Analpram HC Cream 2.5%: Apply externally TID for                        1 week. Procedure Code(s):    --- Professional ---                       [id number], Colonoscopy, flexible; with biopsy, single or                        multiple Diagnosis Code(s):    --- Professional ---                       Z12.11, Encounter for screening for malignant neoplasm                        of colon                       D12.7, Benign neoplasm of rectosigmoid junction                       K64.0, First degree hemorrhoids                       K57.30, Diverticulosis of large intestine without                        perforation or abscess without bleeding CPT copyright 2016 American Medical Association. All rights reserved. The codes documented in this report are preliminary  and upon coder review may  be revised to meet current compliance requirements. Lollie Sails, MD 09/13/2015 8:45:28 AM This report has been signed electronically. Number of Addenda: 0 Note Initiated On: 09/13/2015 7:36 AM Scope Withdrawal Time: 0 hours 7 minutes 7 seconds  Total Procedure Duration: 0  hours 24 minutes 51 seconds       [facility]

## 2015-09-13 NOTE — Anesthesia Preprocedure Evaluation (Signed)
Anesthesia Evaluation  Patient identified by MRN, date of birth, ID band Patient awake    Reviewed: Allergy & Precautions, H&P , NPO status , Patient's Chart, lab work & pertinent test results, reviewed documented beta blocker date and time   Airway Mallampati: II   Neck ROM: full    Dental  (+) Upper Dentures, Lower Dentures   Pulmonary neg pulmonary ROS, sleep apnea , COPD, former smoker,    Pulmonary exam normal        Cardiovascular Exercise Tolerance: Good hypertension, On Medications + CAD and + Cardiac Stents  negative cardio ROS Normal cardiovascular exam Rhythm:regular     Neuro/Psych TIAnegative neurological ROS  negative psych ROS   GI/Hepatic negative GI ROS, Neg liver ROS, GERD  Medicated,  Endo/Other  negative endocrine ROSdiabetes  Renal/GU Renal diseasenegative Renal ROS  negative genitourinary   Musculoskeletal   Abdominal   Peds  Hematology negative hematology ROS (+)   Anesthesia Other Findings Past Medical History:   Allergic rhinitis                                            Barrett's esophagus                             10/21/13      Diabetes mellitus without complication (HCC)                 COPD (chronic obstructive pulmonary disease) (*              Hypertension                                                 GERD (gastroesophageal reflux disease)                       Coronary artery disease                                      Chronic kidney disease                                       Hypercholesteremia                                           Cancer ([place])                                                   Comment:SKIN   Sleep apnea                                                  TIA (transient ischemic  attack)                            Past Surgical History:   CARDIAC CATHETERIZATION                                       EYE SURGERY                                      2013            Comment:CATARACT EXTRACTION BMI    Body Mass Index   30.13 kg/m 2     Reproductive/Obstetrics                             Anesthesia Physical Anesthesia Plan  ASA: III  Anesthesia Plan: General   Post-op Pain Management:    Induction:   Airway Management Planned:   Additional Equipment:   Intra-op Plan:   Post-operative Plan:   Informed Consent: I have reviewed the patients History and Physical, chart, labs and discussed the procedure including the risks, benefits and alternatives for the proposed anesthesia with the patient or authorized representative who has indicated his/her understanding and acceptance.   Dental Advisory Given  Plan Discussed with: CRNA  Anesthesia Plan Comments:         Anesthesia Quick Evaluation

## 2015-09-13 NOTE — Transfer of Care (Signed)
Immediate Anesthesia Transfer of Care Note  Patient: Nathaniel Thompson  Procedure(s) Performed: Procedure(s): COLONOSCOPY WITH PROPOFOL (N/A) ESOPHAGOGASTRODUODENOSCOPY (EGD) WITH PROPOFOL (N/A)  Patient Location: PACU  Anesthesia Type:General  Level of Consciousness: awake, alert , oriented and patient cooperative  Airway & Oxygen Therapy: Patient Spontanous Breathing and Patient connected to nasal cannula oxygen  Post-op Assessment: Report given to RN, Post -op Vital signs reviewed and stable and Patient moving all extremities X 4  Post vital signs: Reviewed and stable  Last Vitals:  Filed Vitals:   09/13/15 0652  BP: 140/57  Pulse: 57  Temp: 35.7 C  Resp: 20    Last Pain: There were no vitals filed for this visit.       Complications: No apparent anesthesia complications

## 2015-09-13 NOTE — Op Note (Signed)
[facility] Gastroenterology Patient Name: Nathaniel Thompson Procedure Date: 09/13/2015 7:37 AM MRN: [id number] Account #: [id number] Date of Birth: 12-30-1935 Admit Type: Outpatient Age: 80 Room: [facility] ENDO ROOM 1 Gender: Male Note Status: Finalized Procedure:            Upper GI endoscopy Indications:          Follow-up of Barrett's esophagus Providers:            Lollie Sails, MD Referring MD:         Dion Body (Referring MD) Medicines:            Monitored Anesthesia Care Complications:        No immediate complications. Procedure:            Pre-Anesthesia Assessment:                       - ASA Grade Assessment: III - A patient with severe                        systemic disease.                       After obtaining informed consent, the endoscope was                        passed under direct vision. Throughout the procedure,                        the patient's blood pressure, pulse, and oxygen                        saturations were monitored continuously. The Endoscope                        was introduced through the mouth, and advanced to the                        third part of duodenum. The upper GI endoscopy was                        accomplished without difficulty. The patient tolerated                        the procedure well. Findings:      There were esophageal mucosal changes consistent with long-segment       Barrett's esophagus present in the lower third of the esophagus. The       maximum longitudinal extent of these mucosal changes was 5 cm in length.       Mucosa was biopsied with a cold forceps for histology in 4 quadrants at       intervals of 2 cm at 34, 36 and 38 cm from the incisors.      The exam of the esophagus was otherwise normal.      The entire examined stomach was normal. There was difficulty maintaining       insufflation due to eructation and evaluation of the fundus was       difficult/minimal.      A small  hiatal hernia was found. The Z-line was a variable distance from       incisors; the hiatal hernia was sliding.  The examined duodenum was normal. Impression:           - Esophageal mucosal changes consistent with                        long-segment Barrett's esophagus. Biopsied. Recommendation:       - Perform a colonoscopy today.                       - Continue present medications. Procedure Code(s):    --- Professional ---                       [phone], Esophagogastroduodenoscopy, flexible, transoral;                        with biopsy, single or multiple Diagnosis Code(s):    --- Professional ---                       K22.70, Barrett's esophagus without dysplasia CPT copyright 2016 American Medical Association. All rights reserved. The codes documented in this report are preliminary and upon coder review may  be revised to meet current compliance requirements. Lollie Sails, MD 09/13/2015 8:15:00 AM This report has been signed electronically. Number of Addenda: 0 Note Initiated On: 09/13/2015 7:37 AM      [facility]

## 2015-09-14 ENCOUNTER — Encounter: Payer: Self-pay | Admitting: Gastroenterology

## 2015-09-14 NOTE — Progress Notes (Signed)
Pt. Had burning on urination . Pt instructed to call md. If it continued today.

## 2015-09-14 NOTE — Progress Notes (Signed)
Add[place], pt. Has no temp or other c/o.[name]

## 2015-09-15 LAB — SURGICAL PATHOLOGY

## 2015-09-16 DIAGNOSIS — K219 Gastro-esophageal reflux disease without esophagitis: Secondary | ICD-10-CM | POA: Diagnosis not present

## 2015-09-16 DIAGNOSIS — I739 Peripheral vascular disease, unspecified: Secondary | ICD-10-CM | POA: Diagnosis not present

## 2015-09-16 DIAGNOSIS — G4730 Sleep apnea, unspecified: Secondary | ICD-10-CM | POA: Diagnosis not present

## 2015-09-16 DIAGNOSIS — E785 Hyperlipidemia, unspecified: Secondary | ICD-10-CM | POA: Diagnosis not present

## 2015-09-16 DIAGNOSIS — I2510 Atherosclerotic heart disease of native coronary artery without angina pectoris: Secondary | ICD-10-CM | POA: Diagnosis not present

## 2015-09-16 DIAGNOSIS — R55 Syncope and collapse: Secondary | ICD-10-CM | POA: Diagnosis not present

## 2015-09-16 DIAGNOSIS — I10 Essential (primary) hypertension: Secondary | ICD-10-CM | POA: Diagnosis not present

## 2015-09-20 NOTE — Anesthesia Postprocedure Evaluation (Signed)
Anesthesia Post Note  Patient: GIULIANO BUCKMASTER  Procedure(s) Performed: Procedure(s) (LRB): COLONOSCOPY WITH PROPOFOL (N/A) ESOPHAGOGASTRODUODENOSCOPY (EGD) WITH PROPOFOL (N/A)  Patient location during evaluation: PACU Anesthesia Type: General Level of consciousness: awake and alert Pain management: pain level controlled Vital Signs Assessment: post-procedure vital signs reviewed and stable Respiratory status: spontaneous breathing, nonlabored ventilation, respiratory function stable and patient connected to nasal cannula oxygen Cardiovascular status: blood pressure returned to baseline and stable Postop Assessment: no signs of nausea or vomiting Anesthetic complications: no    Last Vitals:  Filed Vitals:   09/13/15 0913 09/13/15 0923  BP: 105/64 123/69  Pulse: 75 64  Temp:    Resp: 16 16    Last Pain:  Filed Vitals:   09/14/15 0750  PainSc: 0-No pain                 Molli Barrows

## 2015-09-21 DIAGNOSIS — I739 Peripheral vascular disease, unspecified: Secondary | ICD-10-CM | POA: Diagnosis not present

## 2015-09-24 DIAGNOSIS — K219 Gastro-esophageal reflux disease without esophagitis: Secondary | ICD-10-CM | POA: Diagnosis not present

## 2015-09-24 DIAGNOSIS — I2510 Atherosclerotic heart disease of native coronary artery without angina pectoris: Secondary | ICD-10-CM | POA: Diagnosis not present

## 2015-09-24 DIAGNOSIS — I10 Essential (primary) hypertension: Secondary | ICD-10-CM | POA: Diagnosis not present

## 2015-09-24 DIAGNOSIS — I7389 Other specified peripheral vascular diseases: Secondary | ICD-10-CM | POA: Diagnosis not present

## 2015-09-24 DIAGNOSIS — E785 Hyperlipidemia, unspecified: Secondary | ICD-10-CM | POA: Diagnosis not present

## 2015-09-24 DIAGNOSIS — G4730 Sleep apnea, unspecified: Secondary | ICD-10-CM | POA: Diagnosis not present

## 2015-09-28 DIAGNOSIS — R972 Elevated prostate specific antigen [PSA]: Secondary | ICD-10-CM | POA: Diagnosis not present

## 2015-09-28 DIAGNOSIS — N403 Nodular prostate with lower urinary tract symptoms: Secondary | ICD-10-CM | POA: Diagnosis not present

## 2015-09-28 DIAGNOSIS — N5201 Erectile dysfunction due to arterial insufficiency: Secondary | ICD-10-CM | POA: Diagnosis not present

## 2015-10-21 DIAGNOSIS — R972 Elevated prostate specific antigen [PSA]: Secondary | ICD-10-CM | POA: Diagnosis not present

## 2015-10-28 DIAGNOSIS — C61 Malignant neoplasm of prostate: Secondary | ICD-10-CM | POA: Diagnosis not present

## 2015-11-09 DIAGNOSIS — R972 Elevated prostate specific antigen [PSA]: Secondary | ICD-10-CM | POA: Diagnosis not present

## 2015-11-09 DIAGNOSIS — C61 Malignant neoplasm of prostate: Secondary | ICD-10-CM | POA: Diagnosis not present

## 2015-11-10 ENCOUNTER — Encounter: Payer: Self-pay | Admitting: Radiation Oncology

## 2015-11-10 NOTE — Progress Notes (Signed)
GU Location of Tumor / Histology: prostatic adenocarcinoma  If Prostate Cancer, Gleason Score is (4 + 3) and PSA is (7.85)  Nathaniel Thompson was referred by Dr. Morton Amy to Dr. Karsten Ro for evaluation of an elevated PSA  Biopsies of prostate (if applicable) revealed:   Past/Anticipated interventions by urology, if any: biopsy and referral to Dr. Tammi Klippel  Past/Anticipated interventions by medical oncology, if any: no  Weight changes, if any: no  Bowel/Bladder complaints, if any: Reports occasional dysuria. Reports infrequent urinary leakage. Reports an intermittent urine stream. Reports ED has been present for eight years (which he would like to resolved). Reports urinary frequency and urgency. Reports nocturia x 1.  Nausea/Vomiting, if any: no  Pain issues, if any:  no  SAFETY ISSUES:  Prior radiation? no  Pacemaker/ICD? no  Possible current pregnancy? no  Is the patient on methotrexate? no  Current Complaints / other details:  80 year old male. Prostate volume 74 cc. Strong family history of cancer. IPSS 15.

## 2015-11-15 ENCOUNTER — Ambulatory Visit
Admission: RE | Admit: 2015-11-15 | Discharge: 2015-11-15 | Disposition: A | Discharge status: Home / Self Care | Payer: Medicare HMO | Source: Ambulatory Visit | Attending: Radiation Oncology | Admitting: Radiation Oncology

## 2015-11-15 ENCOUNTER — Encounter: Payer: Self-pay | Admitting: Radiation Oncology

## 2015-11-15 VITALS — BP 122/67 | HR 53 | Resp 16 | Ht 70.0 in | Wt 211.1 lb

## 2015-11-15 DIAGNOSIS — E7800 Pure hypercholesterolemia, unspecified: Secondary | ICD-10-CM | POA: Insufficient documentation

## 2015-11-15 DIAGNOSIS — C61 Malignant neoplasm of prostate: Secondary | ICD-10-CM | POA: Insufficient documentation

## 2015-11-15 DIAGNOSIS — Z8051 Family history of malignant neoplasm of kidney: Secondary | ICD-10-CM | POA: Insufficient documentation

## 2015-11-15 DIAGNOSIS — N189 Chronic kidney disease, unspecified: Secondary | ICD-10-CM | POA: Diagnosis not present

## 2015-11-15 DIAGNOSIS — Z8673 Personal history of transient ischemic attack (TIA), and cerebral infarction without residual deficits: Secondary | ICD-10-CM | POA: Diagnosis not present

## 2015-11-15 DIAGNOSIS — I2510 Atherosclerotic heart disease of native coronary artery without angina pectoris: Secondary | ICD-10-CM | POA: Insufficient documentation

## 2015-11-15 DIAGNOSIS — J449 Chronic obstructive pulmonary disease, unspecified: Secondary | ICD-10-CM | POA: Diagnosis not present

## 2015-11-15 DIAGNOSIS — I129 Hypertensive chronic kidney disease with stage 1 through stage 4 chronic kidney disease, or unspecified chronic kidney disease: Secondary | ICD-10-CM | POA: Insufficient documentation

## 2015-11-15 DIAGNOSIS — K219 Gastro-esophageal reflux disease without esophagitis: Secondary | ICD-10-CM | POA: Insufficient documentation

## 2015-11-15 DIAGNOSIS — Z79899 Other long term (current) drug therapy: Secondary | ICD-10-CM | POA: Diagnosis not present

## 2015-11-15 DIAGNOSIS — Z806 Family history of leukemia: Secondary | ICD-10-CM | POA: Insufficient documentation

## 2015-11-15 DIAGNOSIS — Z800 Family history of malignant neoplasm of digestive organs: Secondary | ICD-10-CM | POA: Insufficient documentation

## 2015-11-15 DIAGNOSIS — Z888 Allergy status to other drugs, medicaments and biological substances status: Secondary | ICD-10-CM | POA: Insufficient documentation

## 2015-11-15 DIAGNOSIS — Z87891 Personal history of nicotine dependence: Secondary | ICD-10-CM | POA: Diagnosis not present

## 2015-11-15 DIAGNOSIS — Z807 Family history of other malignant neoplasms of lymphoid, hematopoietic and related tissues: Secondary | ICD-10-CM | POA: Diagnosis not present

## 2015-11-15 DIAGNOSIS — Z801 Family history of malignant neoplasm of trachea, bronchus and lung: Secondary | ICD-10-CM | POA: Diagnosis not present

## 2015-11-15 DIAGNOSIS — Z85828 Personal history of other malignant neoplasm of skin: Secondary | ICD-10-CM | POA: Insufficient documentation

## 2015-11-15 DIAGNOSIS — G4730 Sleep apnea, unspecified: Secondary | ICD-10-CM | POA: Insufficient documentation

## 2015-11-15 DIAGNOSIS — Z7902 Long term (current) use of antithrombotics/antiplatelets: Secondary | ICD-10-CM | POA: Diagnosis not present

## 2015-11-15 DIAGNOSIS — Z7951 Long term (current) use of inhaled steroids: Secondary | ICD-10-CM | POA: Diagnosis not present

## 2015-11-15 DIAGNOSIS — Z809 Family history of malignant neoplasm, unspecified: Secondary | ICD-10-CM | POA: Diagnosis not present

## 2015-11-15 DIAGNOSIS — Z7982 Long term (current) use of aspirin: Secondary | ICD-10-CM | POA: Diagnosis not present

## 2015-11-15 HISTORY — DX: Unspecified malignant neoplasm of skin, unspecified: C44.90

## 2015-11-15 HISTORY — DX: Malignant neoplasm of prostate: C61

## 2015-11-15 NOTE — Progress Notes (Signed)
See progress note under physician encounter. 

## 2015-11-15 NOTE — Progress Notes (Signed)
Radiation Oncology         [phone] ________________________________  Initial Outpatient Consultation  Name: Nathaniel Thompson MRN: [id number]  Date: 11/15/2015  DOB: [birth year]  [id number], Lucianne Muss, MD  Kathie Rhodes, MD   REFERRING PHYSICIAN: Kathie Rhodes, MD  DIAGNOSIS: The encounter diagnosis was Malignant neoplasm of prostate [facility]).    ICD-9-CM ICD-10-CM   1. Malignant neoplasm of prostate ([place]) 38 C61    80 y.o. gentleman with T2a adenocarcinoma of the prostate Gleason Score 4+3 and PSA 7.85  HISTORY OF PRESENT ILLNESS: Nathaniel Thompson is a 80 y.o. male seen at the request of Dr. Karsten Ro for a new diagnosis of prostate cancer. He was noted to have an elevated PSA of 4.94 in October 2016 by his primary care physician, Dr. Morton Amy.  Accordingly, he was referred for evaluation with Dr. Karsten Ro. His PSA has been followed and continued to elevate to 6.63 in April 2017, and to 7.85 in July 2017. On 09/28/15,  digital rectal examination was performed at that time revealing a 10 mm left apex prostate nodule.  The patient proceeded to transrectal ultrasound with 12 biopsies of the prostate on 10/21/15.  The prostate volume measured 78 cc.  Out of 12 core biopsies, 6 were positive.  The maximum Gleason score was 4+3, and this was seen in the left mid lateral and left apex lateral. Gleason score 3+4 was seen in the left mid, left apex, right apex, and right apex lateral.    The patient reviewed the biopsy results with his urologist and he has kindly been referred today for discussion of potential radiation treatment options. The patient's wife was also present during the encounter.   PREVIOUS RADIATION THERAPY: No  PAST MEDICAL HISTORY:  Past Medical History:  Diagnosis Date  . Allergic rhinitis   . Barrett's esophagus 10/21/13  . Chronic kidney disease    stones  . COPD (chronic obstructive pulmonary disease) (HCC)    mild COPD. former smoker  . Coronary artery disease   .  GERD (gastroesophageal reflux disease)   . Hypercholesteremia   . Hypertension   . Prostate cancer ([place])   . Skin cancer    left cheek/lesion excised  . Sleep apnea   . TIA (transient ischemic attack)       PAST SURGICAL HISTORY: Past Surgical History:  Procedure Laterality Date  . CARDIAC CATHETERIZATION    . COLONOSCOPY WITH PROPOFOL N/A 09/13/2015   Procedure: COLONOSCOPY WITH PROPOFOL;  Surgeon: Lollie Sails, MD;  Location: [facility] ENDOSCOPY;  Service: Endoscopy;  Laterality: N/A;  . ESOPHAGOGASTRODUODENOSCOPY (EGD) WITH PROPOFOL N/A 09/13/2015   Procedure: ESOPHAGOGASTRODUODENOSCOPY (EGD) WITH PROPOFOL;  Surgeon: Lollie Sails, MD;  Location: [facility] ENDOSCOPY;  Service: Endoscopy;  Laterality: N/A;  . EYE SURGERY  2013   CATARACT EXTRACTION  . PROSTATE BIOPSY      FAMILY HISTORY:  Family History  Problem Relation Age of Onset  . Cancer Mother     gastric and lung  . Cancer Father     multiple myeloma  . Stroke Father   . Cancer Sister     leukemia  . Cancer Brother     leukemia  . Cancer Brother     kidney    SOCIAL HISTORY:  Social History   Social History  . Marital status: Married    Spouse name: N/A  . Number of children: N/A  . Years of education: N/A   Occupational History  . Not on file.   Social  History Main Topics  . Smoking status: Former Smoker    Packs/day: 1.00    Years: 35.00    Types: Cigarettes    Quit date: 03/06/1986  . Smokeless tobacco: Former Systems developer    Types: Chew  . Alcohol use No  . Drug use: No  . Sexual activity: Not Currently   Other Topics Concern  . Not on file   Social History Narrative  . No narrative on file    ALLERGIES: Atorvastatin  MEDICATIONS:  Current Outpatient Prescriptions  Medication Sig Dispense Refill  . amLODipine (NORVASC) 5 MG tablet Take 5 mg by mouth daily.    [name] aspirin 81 MG tablet Take 81 mg by mouth daily.    . clopidogrel (PLAVIX) 75 MG tablet Take 75 mg by mouth daily.    .  fluticasone (FLONASE) 50 MCG/ACT nasal spray Place 2 sprays into both nostrils daily.    . Ipratropium-Albuterol (COMBIVENT) 20-100 MCG/ACT AERS respimat Inhale into the lungs.    . isosorbide mononitrate (IMDUR) 30 MG 24 hr tablet Take 30 mg by mouth daily.    [name] losartan (COZAAR) 50 MG tablet Take 50 mg by mouth daily.    [name] lovastatin (MEVACOR) 40 MG tablet Take 80 mg by mouth at bedtime.    . Multiple Vitamin (MULTI-VITAMINS) TABS Take by mouth.    . nitroGLYCERIN (NITROSTAT) 0.4 MG SL tablet Place 0.4 mg under the tongue every 5 (five) minutes as needed for chest pain.    . pantoprazole (PROTONIX) 40 MG tablet Take 40 mg by mouth daily.     No current facility-administered medications for this encounter.     REVIEW OF SYSTEMS:  On review of systems, the patient reports that he is doing well overall. He denies any chest pain, shortness of breath, cough, fevers, chills, night sweats, unintended weight changes. he denies any bowel disturbances, and denies abdominal pain, nausea or vomiting. IPSS survey was completed and the patient's score was 15, indicating moderate urinary symptoms. Symptoms include occasional dysuria, urinary leakage, intermittent urine stream, urinary frequency and urgency, nocturia, and erectile dysfunction. He denies any new musculoskeletal or joint aches or pains.  A complete review of systems is obtained and is otherwise negative.    PHYSICAL EXAM:  height is '5\' 10"'  (1.778 m) and weight is 211 lb 1.6 oz (95.8 kg). His blood pressure is 122/67 and his pulse is 53 (abnormal). His respiration is 16 and oxygen saturation is 100%.   Pain Scale 0/10 In general this is a well appearing Caucasian male in no acute distress. He is alert and oriented x4 and appropriate throughout the examination. HEENT reveals that the patient is normocephalic, atraumatic. EOMs are intact. PERRLA. Skin is intact without any evidence of gross lesions. Cardiovascular exam reveals a regular rate and  rhythm, no clicks rubs or murmurs are auscultated. Chest is clear to auscultation bilaterally. Lymphatic assessment is performed and does not reveal any adenopathy in the cervical, supraclavicular, axillary, or inguinal chains. Abdomen has active bowel sounds in all quadrants and is intact. The abdomen is soft, non tender, non distended. Lower extremities are negative for pretibial pitting edema, deep calf tenderness, cyanosis or clubbing.   KPS = 100  100 - Normal; no complaints; no evidence of disease. 90   - Able to carry on normal activity; minor signs or symptoms of disease. 80   - Normal activity with effort; some signs or symptoms of disease. 72   - Cares for self; unable to carry  on normal activity or to do active work. 60   - Requires occasional assistance, but is able to care for most of his personal needs. 50   - Requires considerable assistance and frequent medical care. 35   - Disabled; requires special care and assistance. 28   - Severely disabled; hospital admission is indicated although death not imminent. 72   - Very sick; hospital admission necessary; active supportive treatment necessary. 10   - Moribund; fatal processes progressing rapidly. 0     - Dead  Karnofsky DA, Abelmann [place], Craver LS and Burchenal [facility] [phone]) The use of the nitrogen mustards in the palliative treatment of carcinoma: with particular reference to bronchogenic carcinoma Cancer 1 634-56  LABORATORY DATA:  Lab Results  Component Value Date   WBC 5.3 09/16/2013   HGB 12.4 (L) 09/16/2013   HCT 37.0 (L) 09/16/2013   MCV 88 09/16/2013   PLT 150 09/16/2013   Lab Results  Component Value Date   NA 140 09/16/2013   K 4.2 09/16/2013   CL 109 (H) 09/16/2013   CO2 24 09/16/2013   Lab Results  Component Value Date   ALT 28 09/15/2013   AST 37 09/15/2013   ALKPHOS 44 (L) 09/15/2013   BILITOT 0.5 09/15/2013     RADIOGRAPHY: No results found.    IMPRESSION/PLAN: 1. 80 y.o. gentleman with  intermediate risk, T2a adenocarcinoma of the prostate with a Gleason's score of 4+3, and PSA of 7.85.Taking the stage, Gleason's score, and PSA into account, the chance of extraprostatic extension is approximately 55.1%. Accordingly he is eligible for a variety of potential treatment options including 8 weeks of IMRT vs. 5 weeks IMRT with seed boost depending on the size of the prostate. At his current size of 78cc, the patient would require some downsizing with androgen depravation therapy as well. Dr. Tammi Klippel also discussed the logistics and delivery of treatment and reviewed the short and long term effects of each option. The patient is interested in meeting with Dr. Colin Rhein as he resides in [place]. We will coordinate this referral. 2. Possible genetic predisposition to malignancy. The patient has a strong family history of malignancy, and we will refer him to genetic counseling to discuss testing.    The above documentation reflects my direct findings during this shared patient visit. Please see the separate note by Dr. Tammi Klippel on this date for the remainder of the patient's plan of care.   Carola Rhine, PAC  This document serves as a record of services personally performed by Shona Simpson, PA-C and Tyler Pita, MD. It was created on their behalf by Darcus Austin, a trained medical scribe. The creation of this record is based on the scribe's personal observations and the providers' statements to them. This document has been checked and approved by the attending provider.

## 2015-11-24 ENCOUNTER — Ambulatory Visit
Admission: RE | Admit: 2015-11-24 | Discharge: 2015-11-24 | Disposition: A | Discharge status: Home / Self Care | Payer: Medicare HMO | Source: Ambulatory Visit | Attending: Radiation Oncology | Admitting: Radiation Oncology

## 2015-11-24 ENCOUNTER — Encounter: Payer: Self-pay | Admitting: Radiation Oncology

## 2015-11-24 VITALS — BP 140/70 | HR 62 | Temp 97.6°F | Wt 214.0 lb

## 2015-11-24 DIAGNOSIS — K2270 Barrett's esophagus without dysplasia: Secondary | ICD-10-CM | POA: Insufficient documentation

## 2015-11-24 DIAGNOSIS — Z87891 Personal history of nicotine dependence: Secondary | ICD-10-CM | POA: Insufficient documentation

## 2015-11-24 DIAGNOSIS — N189 Chronic kidney disease, unspecified: Secondary | ICD-10-CM | POA: Insufficient documentation

## 2015-11-24 DIAGNOSIS — Z809 Family history of malignant neoplasm, unspecified: Secondary | ICD-10-CM | POA: Diagnosis not present

## 2015-11-24 DIAGNOSIS — R350 Frequency of micturition: Secondary | ICD-10-CM | POA: Insufficient documentation

## 2015-11-24 DIAGNOSIS — Z7982 Long term (current) use of aspirin: Secondary | ICD-10-CM | POA: Diagnosis not present

## 2015-11-24 DIAGNOSIS — E7800 Pure hypercholesterolemia, unspecified: Secondary | ICD-10-CM | POA: Insufficient documentation

## 2015-11-24 DIAGNOSIS — Z8673 Personal history of transient ischemic attack (TIA), and cerebral infarction without residual deficits: Secondary | ICD-10-CM | POA: Insufficient documentation

## 2015-11-24 DIAGNOSIS — G4730 Sleep apnea, unspecified: Secondary | ICD-10-CM | POA: Diagnosis not present

## 2015-11-24 DIAGNOSIS — Z85828 Personal history of other malignant neoplasm of skin: Secondary | ICD-10-CM | POA: Insufficient documentation

## 2015-11-24 DIAGNOSIS — Z79899 Other long term (current) drug therapy: Secondary | ICD-10-CM | POA: Diagnosis not present

## 2015-11-24 DIAGNOSIS — J449 Chronic obstructive pulmonary disease, unspecified: Secondary | ICD-10-CM | POA: Insufficient documentation

## 2015-11-24 DIAGNOSIS — C61 Malignant neoplasm of prostate: Secondary | ICD-10-CM

## 2015-11-24 DIAGNOSIS — R3915 Urgency of urination: Secondary | ICD-10-CM | POA: Insufficient documentation

## 2015-11-24 DIAGNOSIS — K219 Gastro-esophageal reflux disease without esophagitis: Secondary | ICD-10-CM | POA: Insufficient documentation

## 2015-11-24 DIAGNOSIS — I2510 Atherosclerotic heart disease of native coronary artery without angina pectoris: Secondary | ICD-10-CM | POA: Insufficient documentation

## 2015-11-24 DIAGNOSIS — I129 Hypertensive chronic kidney disease with stage 1 through stage 4 chronic kidney disease, or unspecified chronic kidney disease: Secondary | ICD-10-CM | POA: Insufficient documentation

## 2015-11-24 NOTE — Consult Note (Signed)
NEW PATIENT EVALUATION  Name: Nathaniel Thompson  MRN: [id number]  Date:   11/24/2015     DOB: [birth year]   This 80 y.o. 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  REFERRING PHYSICIAN: Dion Body, MD  CHIEF COMPLAINT:  Chief Complaint  Patient presents with  . Prostate Cancer    Initial evaluation for radiation treatments    DIAGNOSIS: The encounter diagnosis was Malignant neoplasm of prostate ([place]).   PREVIOUS INVESTIGATIONS:  Pathology reports reviewed CT scan of pelvis reviewed Clinical notes reviewed  HPI: Patient is a 80 year old male seen by urology when his PMD noticed a rising PSA from 5 in 2016 most recently 27.85. Digital rectal exam revealed asymmetry with a nodularity in the apex of the left lateral lobe. He was seen for transrectal ultrasound-guided biopsy showing 6 of 12 cores positive for adenocarcinoma mostly Gleason 7 (3+4) although 2 cores positive for 4+3). Prostate measured 78 cc in volume. He was seen by radiation oncology and recommendation for external beam treatment was made. Patient does have some urgency and frequency of urination nocturia 1. Patient is well known to our department having treated his wife some 20 years prior for lung cancer.  PLANNED TREATMENT REGIMEN: Image guided I MRT radiation therapy  PAST MEDICAL HISTORY:  has a past medical history of Allergic rhinitis; Barrett's esophagus (10/21/13); Chronic kidney disease; COPD (chronic obstructive pulmonary disease) ([place]); Coronary artery disease; GERD (gastroesophageal reflux disease); Hypercholesteremia; Hypertension; Prostate cancer ([place]); Skin cancer; Sleep apnea; and TIA (transient ischemic attack).    PAST SURGICAL HISTORY:  Past Surgical History:  Procedure Laterality Date  . CARDIAC CATHETERIZATION    . COLONOSCOPY WITH PROPOFOL N/A 09/13/2015   Procedure:  COLONOSCOPY WITH PROPOFOL;  Surgeon: Lollie Sails, MD;  Location: [facility] ENDOSCOPY;  Service: Endoscopy;  Laterality: N/A;  . ESOPHAGOGASTRODUODENOSCOPY (EGD) WITH PROPOFOL N/A 09/13/2015   Procedure: ESOPHAGOGASTRODUODENOSCOPY (EGD) WITH PROPOFOL;  Surgeon: Lollie Sails, MD;  Location: [facility] ENDOSCOPY;  Service: Endoscopy;  Laterality: N/A;  . EYE SURGERY  2013   CATARACT EXTRACTION  . PROSTATE BIOPSY      FAMILY HISTORY: family history includes Cancer in his brother, brother, daughter, father, mother, other, and sister; Stroke in his father.  SOCIAL HISTORY:  reports that he quit smoking about 29 years ago. His smoking use included Cigarettes. He has a 35.00 pack-year smoking history. He has quit using smokeless tobacco. His smokeless tobacco use included Chew. He reports that he does not drink alcohol or use drugs.  ALLERGIES: Atorvastatin  MEDICATIONS:  Current Outpatient Prescriptions  Medication Sig Dispense Refill  . amLODipine (NORVASC) 5 MG tablet Take 5 mg by mouth daily.    [name] aspirin 81 MG tablet Take 81 mg by mouth daily.    . clopidogrel (PLAVIX) 75 MG tablet Take 75 mg by mouth daily.    . fluticasone (FLONASE) 50 MCG/ACT nasal spray Place 2 sprays into both nostrils daily.    . Ipratropium-Albuterol (COMBIVENT) 20-100 MCG/ACT AERS respimat Inhale into the lungs.    . isosorbide mononitrate (IMDUR) 30 MG 24 hr tablet Take 30 mg by mouth daily.    [name] losartan (COZAAR) 50 MG tablet Take 50 mg by mouth daily.    [name] lovastatin (MEVACOR) 40 MG tablet Take 80 mg by mouth at bedtime.    . Multiple Vitamin (MULTI-VITAMINS) TABS Take by mouth.    [name]  nitroGLYCERIN (NITROSTAT) 0.4 MG SL tablet Place 0.4 mg under the tongue every 5 (five) minutes as needed for chest pain.    . pantoprazole (PROTONIX) 40 MG tablet Take 40 mg by mouth daily.     No current facility-administered medications for this encounter.     ECOG PERFORMANCE STATUS:  0 - Asymptomatic  REVIEW OF SYSTEMS:   Patient denies any weight loss, fatigue, weakness, fever, chills or night sweats. Patient denies any loss of vision, blurred vision. Patient denies any ringing  of the ears or hearing loss. No irregular heartbeat. Patient denies heart murmur or history of fainting. Patient denies any chest pain or pain radiating to her upper extremities. Patient denies any shortness of breath, difficulty breathing at night, cough or hemoptysis. Patient denies any swelling in the lower legs. Patient denies any nausea vomiting, vomiting of blood, or coffee ground material in the vomitus. Patient denies any stomach pain. Patient states has had normal bowel movements no significant constipation or diarrhea. Patient denies any dysuria, hematuria or significant nocturia. Patient denies any problems walking, swelling in the joints or loss of balance. Patient denies any skin changes, loss of hair or loss of weight. Patient denies any excessive worrying or anxiety or significant depression. Patient denies any problems with insomnia. Patient denies excessive thirst, polyuria, polydipsia. Patient denies any swollen glands, patient denies easy bruising or easy bleeding. Patient denies any recent infections, allergies or URI. Patient "s visual fields have not changed significantly in recent time.    PHYSICAL EXAM: BP 140/70   Pulse 62   Temp 97.6 F (36.4 C)   Wt 213 lb 15.3 oz (97 kg)   BMI 30.70 kg/m  On rectal exam rectal sphincter tone is good there is asymmetry of the prostate with a left lateral lobe having specific nodularity to it. Sulcus is preserved bilaterally. No other rectal abdomen is identified. Well-developed well-nourished patient in NAD. HEENT reveals PERLA, EOMI, discs not visualized.  Oral cavity is clear. No oral mucosal lesions are identified. Neck is clear without evidence of cervical or supraclavicular adenopathy. Lungs are clear to A&P. Cardiac examination is essentially unremarkable with regular rate and  rhythm without murmur rub or thrill. Abdomen is benign with no organomegaly or masses noted. Motor sensory and DTR levels are equal and symmetric in the upper and lower extremities. Cranial nerves II through XII are grossly intact. Proprioception is intact. No peripheral adenopathy or edema is identified. No motor or sensory levels are noted. Crude visual fields are within normal range.  LABORATORY DATA: Pathology reports reviewed    RADIOLOGY RESULTS: No current films for review. No bone scan indicated as PSA is below 10   IMPRESSION: Stage IIa adenocarcinoma the prostate in 80 year old male Gleason 7 (4+3) presenting the PSA of 7.8  PLAN: I have run the [facility] nomogram on the patient showing probability of extracapsular extension the 70% range and lymph node involvement and around the 15% range. Based on his overall age do not feel adding pelvic lymph node radiation prophylactically is advisable and would concentrate on his prostate and surrounding tissue treating up to 8000 cGy over 8 weeks using image guided I MRT radiation therapy. Also patient would not be a candidate for I-125 interstitial implant based on the volume of his prostate over 60 cc. Risks and benefits of external beam radiation including increased lower urinary tract symptoms, diarrhea, fatigue, alteration of blood counts and possibility of increasing erectile dysfunction all were discussed in detail with the  patient and his wife. I have asked urology to place fiduciary markers in the prostate for daily image guided treatment. I've personally scheduled the patient for simulation shortly after markers are placed.  I would like to take this opportunity to thank you for allowing me to participate in the care of your patient.Armstead Peaks., MD

## 2015-12-13 DIAGNOSIS — G4730 Sleep apnea, unspecified: Secondary | ICD-10-CM | POA: Diagnosis not present

## 2015-12-13 DIAGNOSIS — E785 Hyperlipidemia, unspecified: Secondary | ICD-10-CM | POA: Diagnosis not present

## 2015-12-13 DIAGNOSIS — I2510 Atherosclerotic heart disease of native coronary artery without angina pectoris: Secondary | ICD-10-CM | POA: Diagnosis not present

## 2015-12-13 DIAGNOSIS — I10 Essential (primary) hypertension: Secondary | ICD-10-CM | POA: Diagnosis not present

## 2015-12-13 DIAGNOSIS — K210 Gastro-esophageal reflux disease with esophagitis: Secondary | ICD-10-CM | POA: Diagnosis not present

## 2015-12-16 ENCOUNTER — Ambulatory Visit: Payer: Medicare HMO

## 2015-12-16 DIAGNOSIS — R079 Chest pain, unspecified: Secondary | ICD-10-CM | POA: Diagnosis not present

## 2015-12-20 DIAGNOSIS — E785 Hyperlipidemia, unspecified: Secondary | ICD-10-CM | POA: Diagnosis not present

## 2015-12-20 DIAGNOSIS — I2510 Atherosclerotic heart disease of native coronary artery without angina pectoris: Secondary | ICD-10-CM | POA: Diagnosis not present

## 2015-12-20 DIAGNOSIS — I10 Essential (primary) hypertension: Secondary | ICD-10-CM | POA: Diagnosis not present

## 2015-12-20 DIAGNOSIS — K219 Gastro-esophageal reflux disease without esophagitis: Secondary | ICD-10-CM | POA: Diagnosis not present

## 2015-12-22 DIAGNOSIS — C61 Malignant neoplasm of prostate: Secondary | ICD-10-CM | POA: Diagnosis not present

## 2015-12-23 DIAGNOSIS — K22710 Barrett's esophagus with low grade dysplasia: Secondary | ICD-10-CM | POA: Diagnosis not present

## 2015-12-23 DIAGNOSIS — G4733 Obstructive sleep apnea (adult) (pediatric): Secondary | ICD-10-CM | POA: Diagnosis not present

## 2015-12-27 ENCOUNTER — Encounter: Payer: Self-pay | Admitting: *Deleted

## 2015-12-27 ENCOUNTER — Ambulatory Visit
Admission: RE | Admit: 2015-12-27 | Discharge: 2015-12-27 | Disposition: A | Discharge status: Home / Self Care | Payer: Medicare HMO | Source: Ambulatory Visit | Attending: Radiation Oncology | Admitting: Radiation Oncology

## 2015-12-27 DIAGNOSIS — R350 Frequency of micturition: Secondary | ICD-10-CM | POA: Diagnosis not present

## 2015-12-27 DIAGNOSIS — K2270 Barrett's esophagus without dysplasia: Secondary | ICD-10-CM | POA: Diagnosis not present

## 2015-12-27 DIAGNOSIS — K219 Gastro-esophageal reflux disease without esophagitis: Secondary | ICD-10-CM | POA: Diagnosis not present

## 2015-12-27 DIAGNOSIS — E7800 Pure hypercholesterolemia, unspecified: Secondary | ICD-10-CM | POA: Diagnosis not present

## 2015-12-27 DIAGNOSIS — I129 Hypertensive chronic kidney disease with stage 1 through stage 4 chronic kidney disease, or unspecified chronic kidney disease: Secondary | ICD-10-CM | POA: Diagnosis not present

## 2015-12-27 DIAGNOSIS — C61 Malignant neoplasm of prostate: Secondary | ICD-10-CM | POA: Diagnosis not present

## 2015-12-27 DIAGNOSIS — J449 Chronic obstructive pulmonary disease, unspecified: Secondary | ICD-10-CM | POA: Diagnosis not present

## 2015-12-27 DIAGNOSIS — I2510 Atherosclerotic heart disease of native coronary artery without angina pectoris: Secondary | ICD-10-CM | POA: Diagnosis not present

## 2015-12-27 DIAGNOSIS — Z87891 Personal history of nicotine dependence: Secondary | ICD-10-CM | POA: Diagnosis not present

## 2015-12-27 DIAGNOSIS — R3915 Urgency of urination: Secondary | ICD-10-CM | POA: Diagnosis not present

## 2015-12-27 DIAGNOSIS — N189 Chronic kidney disease, unspecified: Secondary | ICD-10-CM | POA: Diagnosis not present

## 2015-12-28 ENCOUNTER — Ambulatory Visit: Payer: Medicare HMO | Admitting: Certified Registered"

## 2015-12-28 ENCOUNTER — Encounter: Payer: Self-pay | Admitting: *Deleted

## 2015-12-28 ENCOUNTER — Ambulatory Visit
Admission: RE | Admit: 2015-12-28 | Discharge: 2015-12-28 | Disposition: A | Discharge status: Home / Self Care | Payer: Medicare HMO | Source: Ambulatory Visit | Attending: Gastroenterology | Admitting: Gastroenterology

## 2015-12-28 ENCOUNTER — Encounter
Admission: RE | Disposition: A | Discharge status: Home / Self Care | Payer: Self-pay | Source: Ambulatory Visit | Attending: Gastroenterology

## 2015-12-28 DIAGNOSIS — Z7902 Long term (current) use of antithrombotics/antiplatelets: Secondary | ICD-10-CM | POA: Diagnosis not present

## 2015-12-28 DIAGNOSIS — K2950 Unspecified chronic gastritis without bleeding: Secondary | ICD-10-CM | POA: Insufficient documentation

## 2015-12-28 DIAGNOSIS — K22710 Barrett's esophagus with low grade dysplasia: Secondary | ICD-10-CM | POA: Diagnosis not present

## 2015-12-28 DIAGNOSIS — I10 Essential (primary) hypertension: Secondary | ICD-10-CM | POA: Diagnosis not present

## 2015-12-28 DIAGNOSIS — K219 Gastro-esophageal reflux disease without esophagitis: Secondary | ICD-10-CM | POA: Insufficient documentation

## 2015-12-28 DIAGNOSIS — K2270 Barrett's esophagus without dysplasia: Secondary | ICD-10-CM | POA: Insufficient documentation

## 2015-12-28 DIAGNOSIS — E7800 Pure hypercholesterolemia, unspecified: Secondary | ICD-10-CM | POA: Diagnosis not present

## 2015-12-28 DIAGNOSIS — K22719 Barrett's esophagus with dysplasia, unspecified: Secondary | ICD-10-CM | POA: Diagnosis not present

## 2015-12-28 DIAGNOSIS — Z8673 Personal history of transient ischemic attack (TIA), and cerebral infarction without residual deficits: Secondary | ICD-10-CM | POA: Insufficient documentation

## 2015-12-28 DIAGNOSIS — I2510 Atherosclerotic heart disease of native coronary artery without angina pectoris: Secondary | ICD-10-CM | POA: Diagnosis not present

## 2015-12-28 DIAGNOSIS — Z79899 Other long term (current) drug therapy: Secondary | ICD-10-CM | POA: Insufficient documentation

## 2015-12-28 DIAGNOSIS — K449 Diaphragmatic hernia without obstruction or gangrene: Secondary | ICD-10-CM | POA: Insufficient documentation

## 2015-12-28 DIAGNOSIS — E119 Type 2 diabetes mellitus without complications: Secondary | ICD-10-CM | POA: Insufficient documentation

## 2015-12-28 DIAGNOSIS — K3189 Other diseases of stomach and duodenum: Secondary | ICD-10-CM | POA: Diagnosis not present

## 2015-12-28 DIAGNOSIS — Z7982 Long term (current) use of aspirin: Secondary | ICD-10-CM | POA: Insufficient documentation

## 2015-12-28 DIAGNOSIS — Z87891 Personal history of nicotine dependence: Secondary | ICD-10-CM | POA: Diagnosis not present

## 2015-12-28 DIAGNOSIS — K2960 Other gastritis without bleeding: Secondary | ICD-10-CM | POA: Diagnosis not present

## 2015-12-28 DIAGNOSIS — J449 Chronic obstructive pulmonary disease, unspecified: Secondary | ICD-10-CM | POA: Diagnosis not present

## 2015-12-28 DIAGNOSIS — Z8546 Personal history of malignant neoplasm of prostate: Secondary | ICD-10-CM | POA: Diagnosis not present

## 2015-12-28 DIAGNOSIS — K317 Polyp of stomach and duodenum: Secondary | ICD-10-CM | POA: Diagnosis not present

## 2015-12-28 DIAGNOSIS — K2970 Gastritis, unspecified, without bleeding: Secondary | ICD-10-CM | POA: Diagnosis not present

## 2015-12-28 HISTORY — PX: ESOPHAGOGASTRODUODENOSCOPY (EGD) WITH PROPOFOL: SHX5813

## 2015-12-28 SURGERY — ESOPHAGOGASTRODUODENOSCOPY (EGD) WITH PROPOFOL
Anesthesia: General

## 2015-12-28 MED ORDER — SODIUM CHLORIDE 0.9 % IV SOLN
INTRAVENOUS | Status: DC
  Administered 2015-12-28: 09:00:00 via INTRAVENOUS

## 2015-12-28 MED ORDER — PROPOFOL 10 MG/ML IV BOLUS
INTRAVENOUS | Status: DC | PRN
  Administered 2015-12-28 (×2): 50 mg via INTRAVENOUS

## 2015-12-28 MED ORDER — GLYCOPYRROLATE 0.2 MG/ML IJ SOLN
INTRAMUSCULAR | Status: DC | PRN
  Administered 2015-12-28: 0.2 mg via INTRAVENOUS

## 2015-12-28 MED ORDER — LIDOCAINE 2% (20 MG/ML) 5 ML SYRINGE
INTRAMUSCULAR | Status: DC | PRN
  Administered 2015-12-28: 50 mg via INTRAVENOUS

## 2015-12-28 MED ORDER — PROPOFOL 500 MG/50ML IV EMUL
INTRAVENOUS | Status: DC | PRN
  Administered 2015-12-28: 100 ug/kg/min via INTRAVENOUS

## 2015-12-28 MED ORDER — ONDANSETRON HCL 4 MG/2ML IJ SOLN: 4.0000 mg | Freq: Once | INTRAMUSCULAR | Status: DC | PRN

## 2015-12-28 MED ORDER — SODIUM CHLORIDE 0.9 % IV SOLN: INTRAVENOUS | Status: DC

## 2015-12-28 MED ORDER — FENTANYL CITRATE (PF) 100 MCG/2ML IJ SOLN: 25.0000 ug | INTRAMUSCULAR | Status: DC | PRN

## 2015-12-28 NOTE — H&P (Signed)
Outpatient short stay form Pre-procedure 12/28/2015 9:36 AM  Nathaniel Sails MD  Primary Physician: Dr Meriel Flavors  Reason for visit:  EGD  History of present illness:  Patient is a 80 year old male presenting today for EGD to follow-up on Barrett's esophagus. His last EGD was about 5-6 months ago. At that time there was some evidence of low-grade dysplasia in biopsies. He is presenting today for repeat check. She does take Plavix which he has held for 5 days he also takes a mini dose 81 mg aspirin which she has held since his last dose yesterday.    Current Facility-Administered Medications:  .  0.9 %  sodium chloride infusion, , Intravenous, Continuous, Nathaniel Sails, MD, Last Rate: 20 mL/hr at 12/28/15 0919 .  0.9 %  sodium chloride infusion, , Intravenous, Continuous, Nathaniel Sails, MD .  fentaNYL (SUBLIMAZE) injection 25 mcg, 25 mcg, Intravenous, Q5 min PRN, Alvin Critchley, MD .  ondansetron [facility]) injection 4 mg, 4 mg, Intravenous, Once PRN, Alvin Critchley, MD  Facility-Administered Medications Ordered in Other Encounters:  .  glycopyrrolate (ROBINUL) injection, , Intravenous, Anesthesia Intra-op, Rolla Plate, CRNA, 0.2 mg at 12/28/15 0932  Prescriptions Prior to Admission  Medication Sig Dispense Refill Last Dose  . amLODipine (NORVASC) 5 MG tablet Take 5 mg by mouth daily.   12/27/2015 at Unknown time  . aspirin 81 MG tablet Take 81 mg by mouth daily.   12/27/2015 at Unknown time  . Ipratropium-Albuterol (COMBIVENT) 20-100 MCG/ACT AERS respimat Inhale into the lungs.   Past Week at Unknown time  . isosorbide mononitrate (IMDUR) 30 MG 24 hr tablet Take 30 mg by mouth daily.   12/27/2015 at Unknown time  . losartan (COZAAR) 50 MG tablet Take 50 mg by mouth daily.   12/27/2015 at Unknown time  . lovastatin (MEVACOR) 40 MG tablet Take 80 mg by mouth at bedtime.   12/27/2015 at Unknown time  . Multiple Vitamin (MULTI-VITAMINS) TABS Take by mouth.   Past Week at  Unknown time  . pantoprazole (PROTONIX) 40 MG tablet Take 40 mg by mouth daily.   12/28/2015 at 0550  . clopidogrel (PLAVIX) 75 MG tablet Take 75 mg by mouth daily.   12/23/2015  . fluticasone (FLONASE) 50 MCG/ACT nasal spray Place 2 sprays into both nostrils daily.   Taking  . nitroGLYCERIN (NITROSTAT) 0.4 MG SL tablet Place 0.4 mg under the tongue every 5 (five) minutes as needed for chest pain.   Taking     Allergies  Allergen Reactions  . Atorvastatin Other (See Comments)    Achy joints     Past Medical History:  Diagnosis Date  . Allergic rhinitis   . Barrett's esophagus 10/21/13  . Chronic kidney disease    stones  . COPD (chronic obstructive pulmonary disease) ([facility])    mild COPD. former smoker  . Coronary artery disease   . GERD (gastroesophageal reflux disease)   . Hypercholesteremia   . Hypertension   . Prostate cancer ([place])   . Skin cancer    left cheek/lesion excised  . Sleep apnea   . TIA (transient ischemic attack)     Review of systems:      Physical Exam    Heart and lungs: Regular rate and rhythm without rub or gallop, lungs are bilaterally clear.    HEENT: Normocephalic atraumatic eyes are anicteric    Other:     Pertinant exam for procedure: Soft nontender nondistended bowel sounds positive normoactive.    Planned  proceedures: EGD and indicated procedures. I have discussed the risks benefits and complications of procedures to include not limited to bleeding, infection, perforation and the risk of sedation and the patient wishes to proceed.    Nathaniel Sails, MD Gastroenterology 12/28/2015  9:36 AM

## 2015-12-28 NOTE — Anesthesia Postprocedure Evaluation (Signed)
Anesthesia Post Note  Patient: Nathaniel Thompson  Procedure(s) Performed: Procedure(s) (LRB): ESOPHAGOGASTRODUODENOSCOPY (EGD) WITH PROPOFOL (N/A)  Patient location during evaluation: PACU Anesthesia Type: General Level of consciousness: awake and alert and oriented Pain management: pain level controlled Vital Signs Assessment: post-procedure vital signs reviewed and stable Respiratory status: spontaneous breathing Cardiovascular status: blood pressure returned to baseline Anesthetic complications: no    Last Vitals:  Vitals:   12/28/15 1030 12/28/15 1040  BP: 139/75 (!) 122/109  Pulse: 61 (!) 56  Resp: 18 16  Temp:      Last Pain:  Vitals:   12/28/15 1010  TempSrc: Tympanic                 Adeola Dennen

## 2015-12-28 NOTE — Op Note (Signed)
[facility] Gastroenterology Patient Name: Nathaniel Thompson Procedure Date: 12/28/2015 9:32 AM MRN: [id number] Account #: [id number] Date of Birth: [birth year] Admit Type: Outpatient Age: 80 Room: [facility] ENDO ROOM 3 Gender: Male Note Status: Finalized Procedure:            Upper GI endoscopy Indications:          Barrett's low grade dysplasia Providers:            Lollie Sails, MD Referring MD:         Dion Body (Referring MD) Medicines:            Monitored Anesthesia Care Complications:        No immediate complications. Procedure:            Pre-Anesthesia Assessment:                       - ASA Grade Assessment: III - A patient with severe                        systemic disease.                       After obtaining informed consent, the endoscope was                        passed under direct vision. Throughout the procedure,                        the patient's blood pressure, pulse, and oxygen                        saturations were monitored continuously. The Endoscope                        was introduced through the mouth, and advanced to the                        third part of duodenum. The upper GI endoscopy was                        accomplished without difficulty. The patient tolerated                        the procedure well. Findings:      There were esophageal mucosal changes secondary to established       long-segment Barrett's disease present in the lower third of the       esophagus. The maximum longitudinal extent of these mucosal changes was       5 cm in length. Mucosa was biopsied with a cold forceps for histology in       4 quadrants at 33, 34, 35 and 37 cm from the incisors. A total of 4       specimen bottles were sent to pathology.      A small hiatal hernia was present.      The exam of the esophagus was otherwise normal.      Localized mild inflammation characterized by erythema and granularity       was found in the  prepyloric region of the stomach. Biopsies were taken       with a cold forceps for histology.  Biopsies were taken with a cold forceps in the gastric body and in the       gastric antrum for histology.      A single 3 mm sessile polyp with no bleeding and no stigmata of recent       bleeding was found on the greater curvature of the gastric body.       Biopsies were taken with a cold forceps for histology.      The examined duodenum was normal. Impression:           - Esophageal mucosal changes secondary to established                        long-segment Barrett's disease. Biopsied.                       - Small hiatal hernia. Recommendation:       - Discharge patient to home.                       - Await pathology results.                       - Telephone GI clinic for pathology results in 1 week. Procedure Code(s):    --- Professional ---                       [phone], Esophagogastroduodenoscopy, flexible, transoral;                        with biopsy, single or multiple Diagnosis Code(s):    --- Professional ---                       K44.9, Diaphragmatic hernia without obstruction or                        gangrene                       K22.710, Barrett's esophagus with low grade dysplasia CPT copyright 2016 American Medical Association. All rights reserved. The codes documented in this report are preliminary and upon coder review may  be revised to meet current compliance requirements. Lollie Sails, MD 12/28/2015 10:13:50 AM This report has been signed electronically. Number of Addenda: 0 Note Initiated On: 12/28/2015 9:32 AM      [facility]

## 2015-12-28 NOTE — Transfer of Care (Signed)
Immediate Anesthesia Transfer of Care Note  Patient: Nathaniel Thompson  Procedure(s) Performed: Procedure(s): ESOPHAGOGASTRODUODENOSCOPY (EGD) WITH PROPOFOL (N/A)  Patient Location: Endoscopy Unit  Anesthesia Type:General  Level of Consciousness: awake  Airway & Oxygen Therapy: Patient Spontanous Breathing and Patient connected to nasal cannula oxygen  Post-op Assessment: Report given to RN and Post -op Vital signs reviewed and stable  Post vital signs: Reviewed  Last Vitals:  Vitals:   12/28/15 0907 12/28/15 1010  BP: (!) 181/81 114/63  Pulse: (!) 55 (!) 57  Resp: 16 16  Temp: 36.6 C (!) 35.9 C    Last Pain:  Vitals:   12/28/15 0907  TempSrc: Oral         Complications: No apparent anesthesia complications

## 2015-12-28 NOTE — Anesthesia Preprocedure Evaluation (Signed)
Anesthesia Evaluation  Patient identified by MRN, date of birth, ID band Patient awake    Reviewed: Allergy & Precautions, H&P , NPO status , Patient's Chart, lab work & pertinent test results, reviewed documented beta blocker date and time   Airway Mallampati: II   Neck ROM: full    Dental  (+) Upper Dentures, Lower Dentures   Pulmonary neg pulmonary ROS, sleep apnea , COPD, former smoker,    Pulmonary exam normal        Cardiovascular Exercise Tolerance: Good hypertension, On Medications + CAD and + Cardiac Stents  negative cardio ROS Normal cardiovascular exam Rhythm:regular     Neuro/Psych TIAnegative neurological ROS  negative psych ROS   GI/Hepatic negative GI ROS, Neg liver ROS, GERD  Medicated,  Endo/Other  negative endocrine ROSdiabetes, Well Controlled, Type 2  Renal/GU Renal InsufficiencyRenal diseasenegative Renal ROS  negative genitourinary   Musculoskeletal negative musculoskeletal ROS (+)   Abdominal   Peds negative pediatric ROS (+)  Hematology negative hematology ROS (+)   Anesthesia Other Findings Past Medical History:   Allergic rhinitis                                            Barrett's esophagus                             10/21/13      Diabetes mellitus without complication (H[facility])                 COPD (chronic obstructive pulmonary disease) (*              Hypertension                                                 GERD (gastroesophageal reflux disease)                       Coronary artery disease                                      Chronic kidney disease                                       Hypercholesteremia                                           Cancer ([place])                                                   Comment:SKIN   Sleep apnea  TIA (transient ischemic attack)                            Past Surgical History:   CARDIAC  CATHETERIZATION                                       EYE SURGERY                                      2013           Comment:CATARACT EXTRACTION BMI    Body Mass Index   30.13 kg/m 2     Reproductive/Obstetrics                             Anesthesia Physical  Anesthesia Plan  ASA: III  Anesthesia Plan: General   Post-op Pain Management:    Induction:   Airway Management Planned: Nasal Cannula  Additional Equipment:   Intra-op Plan:   Post-operative Plan:   Informed Consent: I have reviewed the patients History and Physical, chart, labs and discussed the procedure including the risks, benefits and alternatives for the proposed anesthesia with the patient or authorized representative who has indicated his/her understanding and acceptance.   Dental Advisory Given and Dental advisory given  Plan Discussed with: CRNA and Surgeon  Anesthesia Plan Comments:         Anesthesia Quick Evaluation

## 2015-12-29 ENCOUNTER — Encounter: Payer: Self-pay | Admitting: Gastroenterology

## 2015-12-30 ENCOUNTER — Other Ambulatory Visit: Payer: Self-pay | Admitting: *Deleted

## 2015-12-30 DIAGNOSIS — C61 Malignant neoplasm of prostate: Secondary | ICD-10-CM

## 2015-12-31 LAB — SURGICAL PATHOLOGY

## 2016-01-03 DIAGNOSIS — R3915 Urgency of urination: Secondary | ICD-10-CM | POA: Diagnosis not present

## 2016-01-03 DIAGNOSIS — R350 Frequency of micturition: Secondary | ICD-10-CM | POA: Diagnosis not present

## 2016-01-03 DIAGNOSIS — I129 Hypertensive chronic kidney disease with stage 1 through stage 4 chronic kidney disease, or unspecified chronic kidney disease: Secondary | ICD-10-CM | POA: Diagnosis not present

## 2016-01-03 DIAGNOSIS — Z87891 Personal history of nicotine dependence: Secondary | ICD-10-CM | POA: Diagnosis not present

## 2016-01-03 DIAGNOSIS — K219 Gastro-esophageal reflux disease without esophagitis: Secondary | ICD-10-CM | POA: Diagnosis not present

## 2016-01-03 DIAGNOSIS — C61 Malignant neoplasm of prostate: Secondary | ICD-10-CM | POA: Diagnosis not present

## 2016-01-03 DIAGNOSIS — E7800 Pure hypercholesterolemia, unspecified: Secondary | ICD-10-CM | POA: Diagnosis not present

## 2016-01-03 DIAGNOSIS — I2510 Atherosclerotic heart disease of native coronary artery without angina pectoris: Secondary | ICD-10-CM | POA: Diagnosis not present

## 2016-01-03 DIAGNOSIS — J449 Chronic obstructive pulmonary disease, unspecified: Secondary | ICD-10-CM | POA: Diagnosis not present

## 2016-01-03 DIAGNOSIS — N189 Chronic kidney disease, unspecified: Secondary | ICD-10-CM | POA: Diagnosis not present

## 2016-01-03 DIAGNOSIS — K2270 Barrett's esophagus without dysplasia: Secondary | ICD-10-CM | POA: Diagnosis not present

## 2016-01-04 DIAGNOSIS — I129 Hypertensive chronic kidney disease with stage 1 through stage 4 chronic kidney disease, or unspecified chronic kidney disease: Secondary | ICD-10-CM | POA: Diagnosis not present

## 2016-01-04 DIAGNOSIS — K219 Gastro-esophageal reflux disease without esophagitis: Secondary | ICD-10-CM | POA: Diagnosis not present

## 2016-01-04 DIAGNOSIS — J449 Chronic obstructive pulmonary disease, unspecified: Secondary | ICD-10-CM | POA: Diagnosis not present

## 2016-01-04 DIAGNOSIS — Z87891 Personal history of nicotine dependence: Secondary | ICD-10-CM | POA: Diagnosis not present

## 2016-01-04 DIAGNOSIS — E7800 Pure hypercholesterolemia, unspecified: Secondary | ICD-10-CM | POA: Diagnosis not present

## 2016-01-04 DIAGNOSIS — N189 Chronic kidney disease, unspecified: Secondary | ICD-10-CM | POA: Diagnosis not present

## 2016-01-04 DIAGNOSIS — K2270 Barrett's esophagus without dysplasia: Secondary | ICD-10-CM | POA: Diagnosis not present

## 2016-01-04 DIAGNOSIS — R350 Frequency of micturition: Secondary | ICD-10-CM | POA: Diagnosis not present

## 2016-01-04 DIAGNOSIS — R3915 Urgency of urination: Secondary | ICD-10-CM | POA: Diagnosis not present

## 2016-01-04 DIAGNOSIS — I2510 Atherosclerotic heart disease of native coronary artery without angina pectoris: Secondary | ICD-10-CM | POA: Diagnosis not present

## 2016-01-04 DIAGNOSIS — C61 Malignant neoplasm of prostate: Secondary | ICD-10-CM | POA: Diagnosis not present

## 2016-01-05 ENCOUNTER — Ambulatory Visit
Admission: RE | Admit: 2016-01-05 | Discharge: 2016-01-05 | Disposition: A | Discharge status: Home / Self Care | Payer: Medicare HMO | Source: Ambulatory Visit | Attending: Radiation Oncology | Admitting: Radiation Oncology

## 2016-01-05 ENCOUNTER — Encounter: Payer: Self-pay | Admitting: Medical Oncology

## 2016-01-06 ENCOUNTER — Ambulatory Visit: Payer: Medicare HMO

## 2016-01-06 ENCOUNTER — Ambulatory Visit
Admission: RE | Admit: 2016-01-06 | Discharge: 2016-01-06 | Disposition: A | Discharge status: Home / Self Care | Payer: Medicare HMO | Source: Ambulatory Visit | Attending: Radiation Oncology | Admitting: Radiation Oncology

## 2016-01-06 DIAGNOSIS — C61 Malignant neoplasm of prostate: Secondary | ICD-10-CM | POA: Diagnosis not present

## 2016-01-06 DIAGNOSIS — E7800 Pure hypercholesterolemia, unspecified: Secondary | ICD-10-CM | POA: Diagnosis not present

## 2016-01-06 DIAGNOSIS — K219 Gastro-esophageal reflux disease without esophagitis: Secondary | ICD-10-CM | POA: Diagnosis not present

## 2016-01-06 DIAGNOSIS — R3915 Urgency of urination: Secondary | ICD-10-CM | POA: Diagnosis not present

## 2016-01-06 DIAGNOSIS — N189 Chronic kidney disease, unspecified: Secondary | ICD-10-CM | POA: Diagnosis not present

## 2016-01-06 DIAGNOSIS — I129 Hypertensive chronic kidney disease with stage 1 through stage 4 chronic kidney disease, or unspecified chronic kidney disease: Secondary | ICD-10-CM | POA: Diagnosis not present

## 2016-01-06 DIAGNOSIS — J449 Chronic obstructive pulmonary disease, unspecified: Secondary | ICD-10-CM | POA: Diagnosis not present

## 2016-01-06 DIAGNOSIS — R350 Frequency of micturition: Secondary | ICD-10-CM | POA: Diagnosis not present

## 2016-01-06 DIAGNOSIS — I2510 Atherosclerotic heart disease of native coronary artery without angina pectoris: Secondary | ICD-10-CM | POA: Diagnosis not present

## 2016-01-06 DIAGNOSIS — K2270 Barrett's esophagus without dysplasia: Secondary | ICD-10-CM | POA: Diagnosis not present

## 2016-01-07 ENCOUNTER — Ambulatory Visit
Admission: RE | Admit: 2016-01-07 | Discharge: 2016-01-07 | Disposition: A | Discharge status: Home / Self Care | Payer: Medicare HMO | Source: Ambulatory Visit | Attending: Radiation Oncology | Admitting: Radiation Oncology

## 2016-01-07 DIAGNOSIS — R350 Frequency of micturition: Secondary | ICD-10-CM | POA: Diagnosis not present

## 2016-01-07 DIAGNOSIS — C61 Malignant neoplasm of prostate: Secondary | ICD-10-CM | POA: Diagnosis not present

## 2016-01-07 DIAGNOSIS — N189 Chronic kidney disease, unspecified: Secondary | ICD-10-CM | POA: Diagnosis not present

## 2016-01-07 DIAGNOSIS — E7800 Pure hypercholesterolemia, unspecified: Secondary | ICD-10-CM | POA: Diagnosis not present

## 2016-01-07 DIAGNOSIS — R3915 Urgency of urination: Secondary | ICD-10-CM | POA: Diagnosis not present

## 2016-01-07 DIAGNOSIS — I2510 Atherosclerotic heart disease of native coronary artery without angina pectoris: Secondary | ICD-10-CM | POA: Diagnosis not present

## 2016-01-07 DIAGNOSIS — I129 Hypertensive chronic kidney disease with stage 1 through stage 4 chronic kidney disease, or unspecified chronic kidney disease: Secondary | ICD-10-CM | POA: Diagnosis not present

## 2016-01-07 DIAGNOSIS — J449 Chronic obstructive pulmonary disease, unspecified: Secondary | ICD-10-CM | POA: Diagnosis not present

## 2016-01-07 DIAGNOSIS — K219 Gastro-esophageal reflux disease without esophagitis: Secondary | ICD-10-CM | POA: Diagnosis not present

## 2016-01-07 DIAGNOSIS — K2270 Barrett's esophagus without dysplasia: Secondary | ICD-10-CM | POA: Diagnosis not present

## 2016-01-10 ENCOUNTER — Ambulatory Visit
Admission: RE | Admit: 2016-01-10 | Discharge: 2016-01-10 | Disposition: A | Discharge status: Home / Self Care | Payer: Medicare HMO | Source: Ambulatory Visit | Attending: Radiation Oncology | Admitting: Radiation Oncology

## 2016-01-10 DIAGNOSIS — E7800 Pure hypercholesterolemia, unspecified: Secondary | ICD-10-CM | POA: Diagnosis not present

## 2016-01-10 DIAGNOSIS — K2270 Barrett's esophagus without dysplasia: Secondary | ICD-10-CM | POA: Diagnosis not present

## 2016-01-10 DIAGNOSIS — J449 Chronic obstructive pulmonary disease, unspecified: Secondary | ICD-10-CM | POA: Diagnosis not present

## 2016-01-10 DIAGNOSIS — I129 Hypertensive chronic kidney disease with stage 1 through stage 4 chronic kidney disease, or unspecified chronic kidney disease: Secondary | ICD-10-CM | POA: Diagnosis not present

## 2016-01-10 DIAGNOSIS — I2510 Atherosclerotic heart disease of native coronary artery without angina pectoris: Secondary | ICD-10-CM | POA: Diagnosis not present

## 2016-01-10 DIAGNOSIS — R3915 Urgency of urination: Secondary | ICD-10-CM | POA: Diagnosis not present

## 2016-01-10 DIAGNOSIS — C61 Malignant neoplasm of prostate: Secondary | ICD-10-CM | POA: Diagnosis not present

## 2016-01-10 DIAGNOSIS — K219 Gastro-esophageal reflux disease without esophagitis: Secondary | ICD-10-CM | POA: Diagnosis not present

## 2016-01-10 DIAGNOSIS — N189 Chronic kidney disease, unspecified: Secondary | ICD-10-CM | POA: Diagnosis not present

## 2016-01-10 DIAGNOSIS — R350 Frequency of micturition: Secondary | ICD-10-CM | POA: Diagnosis not present

## 2016-01-11 ENCOUNTER — Ambulatory Visit
Admission: RE | Admit: 2016-01-11 | Discharge: 2016-01-11 | Disposition: A | Discharge status: Home / Self Care | Payer: Medicare HMO | Source: Ambulatory Visit | Attending: Radiation Oncology | Admitting: Radiation Oncology

## 2016-01-11 DIAGNOSIS — K219 Gastro-esophageal reflux disease without esophagitis: Secondary | ICD-10-CM | POA: Diagnosis not present

## 2016-01-11 DIAGNOSIS — R350 Frequency of micturition: Secondary | ICD-10-CM | POA: Diagnosis not present

## 2016-01-11 DIAGNOSIS — J449 Chronic obstructive pulmonary disease, unspecified: Secondary | ICD-10-CM | POA: Diagnosis not present

## 2016-01-11 DIAGNOSIS — I129 Hypertensive chronic kidney disease with stage 1 through stage 4 chronic kidney disease, or unspecified chronic kidney disease: Secondary | ICD-10-CM | POA: Diagnosis not present

## 2016-01-11 DIAGNOSIS — E7800 Pure hypercholesterolemia, unspecified: Secondary | ICD-10-CM | POA: Diagnosis not present

## 2016-01-11 DIAGNOSIS — C61 Malignant neoplasm of prostate: Secondary | ICD-10-CM | POA: Diagnosis not present

## 2016-01-11 DIAGNOSIS — N189 Chronic kidney disease, unspecified: Secondary | ICD-10-CM | POA: Diagnosis not present

## 2016-01-11 DIAGNOSIS — K2270 Barrett's esophagus without dysplasia: Secondary | ICD-10-CM | POA: Diagnosis not present

## 2016-01-11 DIAGNOSIS — I2510 Atherosclerotic heart disease of native coronary artery without angina pectoris: Secondary | ICD-10-CM | POA: Diagnosis not present

## 2016-01-11 DIAGNOSIS — R3915 Urgency of urination: Secondary | ICD-10-CM | POA: Diagnosis not present

## 2016-01-11 NOTE — Progress Notes (Signed)
Nathaniel Thompson saw Dr. Tammi Klippel 9/11 but due to residing in [place] he was consulted with Dr. Donella Stade and is now receiving treatments at [facility].

## 2016-01-12 ENCOUNTER — Ambulatory Visit
Admission: RE | Admit: 2016-01-12 | Discharge: 2016-01-12 | Disposition: A | Discharge status: Home / Self Care | Payer: Medicare HMO | Source: Ambulatory Visit | Attending: Radiation Oncology | Admitting: Radiation Oncology

## 2016-01-12 DIAGNOSIS — K219 Gastro-esophageal reflux disease without esophagitis: Secondary | ICD-10-CM | POA: Diagnosis not present

## 2016-01-12 DIAGNOSIS — R3915 Urgency of urination: Secondary | ICD-10-CM | POA: Diagnosis not present

## 2016-01-12 DIAGNOSIS — N189 Chronic kidney disease, unspecified: Secondary | ICD-10-CM | POA: Diagnosis not present

## 2016-01-12 DIAGNOSIS — Z87891 Personal history of nicotine dependence: Secondary | ICD-10-CM | POA: Diagnosis not present

## 2016-01-12 DIAGNOSIS — I2510 Atherosclerotic heart disease of native coronary artery without angina pectoris: Secondary | ICD-10-CM | POA: Diagnosis not present

## 2016-01-12 DIAGNOSIS — J449 Chronic obstructive pulmonary disease, unspecified: Secondary | ICD-10-CM | POA: Diagnosis not present

## 2016-01-12 DIAGNOSIS — R350 Frequency of micturition: Secondary | ICD-10-CM | POA: Diagnosis not present

## 2016-01-12 DIAGNOSIS — C61 Malignant neoplasm of prostate: Secondary | ICD-10-CM | POA: Diagnosis not present

## 2016-01-12 DIAGNOSIS — E7800 Pure hypercholesterolemia, unspecified: Secondary | ICD-10-CM | POA: Diagnosis not present

## 2016-01-12 DIAGNOSIS — I129 Hypertensive chronic kidney disease with stage 1 through stage 4 chronic kidney disease, or unspecified chronic kidney disease: Secondary | ICD-10-CM | POA: Diagnosis not present

## 2016-01-12 DIAGNOSIS — K2270 Barrett's esophagus without dysplasia: Secondary | ICD-10-CM | POA: Diagnosis not present

## 2016-01-13 ENCOUNTER — Ambulatory Visit
Admission: RE | Admit: 2016-01-13 | Discharge: 2016-01-13 | Disposition: A | Discharge status: Home / Self Care | Payer: Medicare HMO | Source: Ambulatory Visit | Attending: Radiation Oncology | Admitting: Radiation Oncology

## 2016-01-13 DIAGNOSIS — C61 Malignant neoplasm of prostate: Secondary | ICD-10-CM | POA: Diagnosis not present

## 2016-01-13 DIAGNOSIS — Z87891 Personal history of nicotine dependence: Secondary | ICD-10-CM | POA: Diagnosis not present

## 2016-01-13 DIAGNOSIS — K2270 Barrett's esophagus without dysplasia: Secondary | ICD-10-CM | POA: Diagnosis not present

## 2016-01-13 DIAGNOSIS — J449 Chronic obstructive pulmonary disease, unspecified: Secondary | ICD-10-CM | POA: Diagnosis not present

## 2016-01-13 DIAGNOSIS — R3915 Urgency of urination: Secondary | ICD-10-CM | POA: Diagnosis not present

## 2016-01-13 DIAGNOSIS — R350 Frequency of micturition: Secondary | ICD-10-CM | POA: Diagnosis not present

## 2016-01-13 DIAGNOSIS — K219 Gastro-esophageal reflux disease without esophagitis: Secondary | ICD-10-CM | POA: Diagnosis not present

## 2016-01-13 DIAGNOSIS — N189 Chronic kidney disease, unspecified: Secondary | ICD-10-CM | POA: Diagnosis not present

## 2016-01-13 DIAGNOSIS — I129 Hypertensive chronic kidney disease with stage 1 through stage 4 chronic kidney disease, or unspecified chronic kidney disease: Secondary | ICD-10-CM | POA: Diagnosis not present

## 2016-01-13 DIAGNOSIS — I2510 Atherosclerotic heart disease of native coronary artery without angina pectoris: Secondary | ICD-10-CM | POA: Diagnosis not present

## 2016-01-13 DIAGNOSIS — E7800 Pure hypercholesterolemia, unspecified: Secondary | ICD-10-CM | POA: Diagnosis not present

## 2016-01-14 ENCOUNTER — Ambulatory Visit: Payer: Medicare HMO

## 2016-01-17 ENCOUNTER — Ambulatory Visit
Admission: RE | Admit: 2016-01-17 | Discharge: 2016-01-17 | Disposition: A | Discharge status: Home / Self Care | Payer: Medicare HMO | Source: Ambulatory Visit | Attending: Radiation Oncology | Admitting: Radiation Oncology

## 2016-01-17 DIAGNOSIS — I129 Hypertensive chronic kidney disease with stage 1 through stage 4 chronic kidney disease, or unspecified chronic kidney disease: Secondary | ICD-10-CM | POA: Diagnosis not present

## 2016-01-17 DIAGNOSIS — I2510 Atherosclerotic heart disease of native coronary artery without angina pectoris: Secondary | ICD-10-CM | POA: Diagnosis not present

## 2016-01-17 DIAGNOSIS — R3915 Urgency of urination: Secondary | ICD-10-CM | POA: Diagnosis not present

## 2016-01-17 DIAGNOSIS — J449 Chronic obstructive pulmonary disease, unspecified: Secondary | ICD-10-CM | POA: Diagnosis not present

## 2016-01-17 DIAGNOSIS — K2270 Barrett's esophagus without dysplasia: Secondary | ICD-10-CM | POA: Diagnosis not present

## 2016-01-17 DIAGNOSIS — C61 Malignant neoplasm of prostate: Secondary | ICD-10-CM | POA: Diagnosis not present

## 2016-01-17 DIAGNOSIS — I10 Essential (primary) hypertension: Secondary | ICD-10-CM | POA: Diagnosis not present

## 2016-01-17 DIAGNOSIS — R350 Frequency of micturition: Secondary | ICD-10-CM | POA: Diagnosis not present

## 2016-01-17 DIAGNOSIS — N189 Chronic kidney disease, unspecified: Secondary | ICD-10-CM | POA: Diagnosis not present

## 2016-01-17 DIAGNOSIS — K219 Gastro-esophageal reflux disease without esophagitis: Secondary | ICD-10-CM | POA: Diagnosis not present

## 2016-01-17 DIAGNOSIS — E7800 Pure hypercholesterolemia, unspecified: Secondary | ICD-10-CM | POA: Diagnosis not present

## 2016-01-18 ENCOUNTER — Ambulatory Visit
Admission: RE | Admit: 2016-01-18 | Discharge: 2016-01-18 | Disposition: A | Discharge status: Home / Self Care | Payer: Medicare HMO | Source: Ambulatory Visit | Attending: Radiation Oncology | Admitting: Radiation Oncology

## 2016-01-18 DIAGNOSIS — I2510 Atherosclerotic heart disease of native coronary artery without angina pectoris: Secondary | ICD-10-CM | POA: Diagnosis not present

## 2016-01-18 DIAGNOSIS — C61 Malignant neoplasm of prostate: Secondary | ICD-10-CM | POA: Diagnosis not present

## 2016-01-18 DIAGNOSIS — R3915 Urgency of urination: Secondary | ICD-10-CM | POA: Diagnosis not present

## 2016-01-18 DIAGNOSIS — I129 Hypertensive chronic kidney disease with stage 1 through stage 4 chronic kidney disease, or unspecified chronic kidney disease: Secondary | ICD-10-CM | POA: Diagnosis not present

## 2016-01-18 DIAGNOSIS — E7800 Pure hypercholesterolemia, unspecified: Secondary | ICD-10-CM | POA: Diagnosis not present

## 2016-01-18 DIAGNOSIS — R350 Frequency of micturition: Secondary | ICD-10-CM | POA: Diagnosis not present

## 2016-01-18 DIAGNOSIS — K2270 Barrett's esophagus without dysplasia: Secondary | ICD-10-CM | POA: Diagnosis not present

## 2016-01-18 DIAGNOSIS — N189 Chronic kidney disease, unspecified: Secondary | ICD-10-CM | POA: Diagnosis not present

## 2016-01-18 DIAGNOSIS — J449 Chronic obstructive pulmonary disease, unspecified: Secondary | ICD-10-CM | POA: Diagnosis not present

## 2016-01-18 DIAGNOSIS — K219 Gastro-esophageal reflux disease without esophagitis: Secondary | ICD-10-CM | POA: Diagnosis not present

## 2016-01-19 ENCOUNTER — Ambulatory Visit
Admission: RE | Admit: 2016-01-19 | Discharge: 2016-01-19 | Disposition: A | Discharge status: Home / Self Care | Payer: Medicare HMO | Source: Ambulatory Visit | Attending: Radiation Oncology | Admitting: Radiation Oncology

## 2016-01-19 DIAGNOSIS — K219 Gastro-esophageal reflux disease without esophagitis: Secondary | ICD-10-CM | POA: Diagnosis not present

## 2016-01-19 DIAGNOSIS — I129 Hypertensive chronic kidney disease with stage 1 through stage 4 chronic kidney disease, or unspecified chronic kidney disease: Secondary | ICD-10-CM | POA: Diagnosis not present

## 2016-01-19 DIAGNOSIS — E7800 Pure hypercholesterolemia, unspecified: Secondary | ICD-10-CM | POA: Diagnosis not present

## 2016-01-19 DIAGNOSIS — Z87891 Personal history of nicotine dependence: Secondary | ICD-10-CM | POA: Diagnosis not present

## 2016-01-19 DIAGNOSIS — R350 Frequency of micturition: Secondary | ICD-10-CM | POA: Diagnosis not present

## 2016-01-19 DIAGNOSIS — N189 Chronic kidney disease, unspecified: Secondary | ICD-10-CM | POA: Diagnosis not present

## 2016-01-19 DIAGNOSIS — I2510 Atherosclerotic heart disease of native coronary artery without angina pectoris: Secondary | ICD-10-CM | POA: Diagnosis not present

## 2016-01-19 DIAGNOSIS — R3915 Urgency of urination: Secondary | ICD-10-CM | POA: Diagnosis not present

## 2016-01-19 DIAGNOSIS — K2270 Barrett's esophagus without dysplasia: Secondary | ICD-10-CM | POA: Diagnosis not present

## 2016-01-19 DIAGNOSIS — J449 Chronic obstructive pulmonary disease, unspecified: Secondary | ICD-10-CM | POA: Diagnosis not present

## 2016-01-19 DIAGNOSIS — C61 Malignant neoplasm of prostate: Secondary | ICD-10-CM | POA: Diagnosis not present

## 2016-01-20 ENCOUNTER — Ambulatory Visit
Admission: RE | Admit: 2016-01-20 | Discharge: 2016-01-20 | Disposition: A | Discharge status: Home / Self Care | Payer: Medicare HMO | Source: Ambulatory Visit | Attending: Radiation Oncology | Admitting: Radiation Oncology

## 2016-01-20 ENCOUNTER — Inpatient Hospital Stay: Payer: Medicare HMO | Attending: Radiation Oncology

## 2016-01-20 DIAGNOSIS — K219 Gastro-esophageal reflux disease without esophagitis: Secondary | ICD-10-CM | POA: Diagnosis not present

## 2016-01-20 DIAGNOSIS — N189 Chronic kidney disease, unspecified: Secondary | ICD-10-CM | POA: Diagnosis not present

## 2016-01-20 DIAGNOSIS — E7800 Pure hypercholesterolemia, unspecified: Secondary | ICD-10-CM | POA: Diagnosis not present

## 2016-01-20 DIAGNOSIS — J449 Chronic obstructive pulmonary disease, unspecified: Secondary | ICD-10-CM | POA: Diagnosis not present

## 2016-01-20 DIAGNOSIS — I2510 Atherosclerotic heart disease of native coronary artery without angina pectoris: Secondary | ICD-10-CM | POA: Diagnosis not present

## 2016-01-20 DIAGNOSIS — I129 Hypertensive chronic kidney disease with stage 1 through stage 4 chronic kidney disease, or unspecified chronic kidney disease: Secondary | ICD-10-CM | POA: Diagnosis not present

## 2016-01-20 DIAGNOSIS — R350 Frequency of micturition: Secondary | ICD-10-CM | POA: Diagnosis not present

## 2016-01-20 DIAGNOSIS — C61 Malignant neoplasm of prostate: Secondary | ICD-10-CM | POA: Insufficient documentation

## 2016-01-20 DIAGNOSIS — K2270 Barrett's esophagus without dysplasia: Secondary | ICD-10-CM | POA: Diagnosis not present

## 2016-01-20 DIAGNOSIS — R3915 Urgency of urination: Secondary | ICD-10-CM | POA: Diagnosis not present

## 2016-01-20 DIAGNOSIS — Z87891 Personal history of nicotine dependence: Secondary | ICD-10-CM | POA: Diagnosis not present

## 2016-01-20 LAB — CBC
HCT: 39.7 % — ABNORMAL LOW (ref 40.0–52.0)
HEMOGLOBIN: 13.4 g/dL (ref 13.0–18.0)
MCH: 29.3 pg (ref 26.0–34.0)
MCHC: 33.9 g/dL (ref 32.0–36.0)
MCV: 86.5 fL (ref 80.0–100.0)
PLATELETS: 154 10*3/uL (ref 150–440)
RBC: 4.58 MIL/uL (ref 4.40–5.90)
RDW: 13.6 % (ref 11.5–14.5)
WBC: 4.7 10*3/uL (ref 3.8–10.6)

## 2016-01-21 ENCOUNTER — Ambulatory Visit
Admission: RE | Admit: 2016-01-21 | Discharge: 2016-01-21 | Disposition: A | Discharge status: Home / Self Care | Payer: Medicare HMO | Source: Ambulatory Visit | Attending: Radiation Oncology | Admitting: Radiation Oncology

## 2016-01-21 DIAGNOSIS — I2510 Atherosclerotic heart disease of native coronary artery without angina pectoris: Secondary | ICD-10-CM | POA: Diagnosis not present

## 2016-01-21 DIAGNOSIS — C61 Malignant neoplasm of prostate: Secondary | ICD-10-CM | POA: Diagnosis not present

## 2016-01-21 DIAGNOSIS — Z87891 Personal history of nicotine dependence: Secondary | ICD-10-CM | POA: Diagnosis not present

## 2016-01-21 DIAGNOSIS — K2270 Barrett's esophagus without dysplasia: Secondary | ICD-10-CM | POA: Diagnosis not present

## 2016-01-21 DIAGNOSIS — K219 Gastro-esophageal reflux disease without esophagitis: Secondary | ICD-10-CM | POA: Diagnosis not present

## 2016-01-21 DIAGNOSIS — J449 Chronic obstructive pulmonary disease, unspecified: Secondary | ICD-10-CM | POA: Diagnosis not present

## 2016-01-21 DIAGNOSIS — I129 Hypertensive chronic kidney disease with stage 1 through stage 4 chronic kidney disease, or unspecified chronic kidney disease: Secondary | ICD-10-CM | POA: Diagnosis not present

## 2016-01-21 DIAGNOSIS — R3915 Urgency of urination: Secondary | ICD-10-CM | POA: Diagnosis not present

## 2016-01-21 DIAGNOSIS — E7800 Pure hypercholesterolemia, unspecified: Secondary | ICD-10-CM | POA: Diagnosis not present

## 2016-01-21 DIAGNOSIS — N189 Chronic kidney disease, unspecified: Secondary | ICD-10-CM | POA: Diagnosis not present

## 2016-01-21 DIAGNOSIS — R350 Frequency of micturition: Secondary | ICD-10-CM | POA: Diagnosis not present

## 2016-01-24 ENCOUNTER — Ambulatory Visit
Admission: RE | Admit: 2016-01-24 | Discharge: 2016-01-24 | Disposition: A | Discharge status: Home / Self Care | Payer: Medicare HMO | Source: Ambulatory Visit | Attending: Radiation Oncology | Admitting: Radiation Oncology

## 2016-01-24 DIAGNOSIS — I129 Hypertensive chronic kidney disease with stage 1 through stage 4 chronic kidney disease, or unspecified chronic kidney disease: Secondary | ICD-10-CM | POA: Diagnosis not present

## 2016-01-24 DIAGNOSIS — E7800 Pure hypercholesterolemia, unspecified: Secondary | ICD-10-CM | POA: Diagnosis not present

## 2016-01-24 DIAGNOSIS — K2270 Barrett's esophagus without dysplasia: Secondary | ICD-10-CM | POA: Diagnosis not present

## 2016-01-24 DIAGNOSIS — N189 Chronic kidney disease, unspecified: Secondary | ICD-10-CM | POA: Diagnosis not present

## 2016-01-24 DIAGNOSIS — C61 Malignant neoplasm of prostate: Secondary | ICD-10-CM | POA: Diagnosis not present

## 2016-01-24 DIAGNOSIS — K219 Gastro-esophageal reflux disease without esophagitis: Secondary | ICD-10-CM | POA: Diagnosis not present

## 2016-01-24 DIAGNOSIS — I2510 Atherosclerotic heart disease of native coronary artery without angina pectoris: Secondary | ICD-10-CM | POA: Diagnosis not present

## 2016-01-24 DIAGNOSIS — R350 Frequency of micturition: Secondary | ICD-10-CM | POA: Diagnosis not present

## 2016-01-24 DIAGNOSIS — J449 Chronic obstructive pulmonary disease, unspecified: Secondary | ICD-10-CM | POA: Diagnosis not present

## 2016-01-24 DIAGNOSIS — R3915 Urgency of urination: Secondary | ICD-10-CM | POA: Diagnosis not present

## 2016-01-25 ENCOUNTER — Ambulatory Visit
Admission: RE | Admit: 2016-01-25 | Discharge: 2016-01-25 | Disposition: A | Discharge status: Home / Self Care | Payer: Medicare HMO | Source: Ambulatory Visit | Attending: Radiation Oncology | Admitting: Radiation Oncology

## 2016-01-25 DIAGNOSIS — E7800 Pure hypercholesterolemia, unspecified: Secondary | ICD-10-CM | POA: Diagnosis not present

## 2016-01-25 DIAGNOSIS — J449 Chronic obstructive pulmonary disease, unspecified: Secondary | ICD-10-CM | POA: Diagnosis not present

## 2016-01-25 DIAGNOSIS — K2270 Barrett's esophagus without dysplasia: Secondary | ICD-10-CM | POA: Diagnosis not present

## 2016-01-25 DIAGNOSIS — N189 Chronic kidney disease, unspecified: Secondary | ICD-10-CM | POA: Diagnosis not present

## 2016-01-25 DIAGNOSIS — R350 Frequency of micturition: Secondary | ICD-10-CM | POA: Diagnosis not present

## 2016-01-25 DIAGNOSIS — I2510 Atherosclerotic heart disease of native coronary artery without angina pectoris: Secondary | ICD-10-CM | POA: Diagnosis not present

## 2016-01-25 DIAGNOSIS — C61 Malignant neoplasm of prostate: Secondary | ICD-10-CM | POA: Diagnosis not present

## 2016-01-25 DIAGNOSIS — K219 Gastro-esophageal reflux disease without esophagitis: Secondary | ICD-10-CM | POA: Diagnosis not present

## 2016-01-25 DIAGNOSIS — I129 Hypertensive chronic kidney disease with stage 1 through stage 4 chronic kidney disease, or unspecified chronic kidney disease: Secondary | ICD-10-CM | POA: Diagnosis not present

## 2016-01-25 DIAGNOSIS — R3915 Urgency of urination: Secondary | ICD-10-CM | POA: Diagnosis not present

## 2016-01-26 ENCOUNTER — Ambulatory Visit
Admission: RE | Admit: 2016-01-26 | Discharge: 2016-01-26 | Disposition: A | Discharge status: Home / Self Care | Payer: Medicare HMO | Source: Ambulatory Visit | Attending: Radiation Oncology | Admitting: Radiation Oncology

## 2016-01-26 DIAGNOSIS — K2270 Barrett's esophagus without dysplasia: Secondary | ICD-10-CM | POA: Diagnosis not present

## 2016-01-26 DIAGNOSIS — I129 Hypertensive chronic kidney disease with stage 1 through stage 4 chronic kidney disease, or unspecified chronic kidney disease: Secondary | ICD-10-CM | POA: Diagnosis not present

## 2016-01-26 DIAGNOSIS — R350 Frequency of micturition: Secondary | ICD-10-CM | POA: Diagnosis not present

## 2016-01-26 DIAGNOSIS — R3915 Urgency of urination: Secondary | ICD-10-CM | POA: Diagnosis not present

## 2016-01-26 DIAGNOSIS — I2510 Atherosclerotic heart disease of native coronary artery without angina pectoris: Secondary | ICD-10-CM | POA: Diagnosis not present

## 2016-01-26 DIAGNOSIS — J449 Chronic obstructive pulmonary disease, unspecified: Secondary | ICD-10-CM | POA: Diagnosis not present

## 2016-01-26 DIAGNOSIS — E7800 Pure hypercholesterolemia, unspecified: Secondary | ICD-10-CM | POA: Diagnosis not present

## 2016-01-26 DIAGNOSIS — K219 Gastro-esophageal reflux disease without esophagitis: Secondary | ICD-10-CM | POA: Diagnosis not present

## 2016-01-26 DIAGNOSIS — C61 Malignant neoplasm of prostate: Secondary | ICD-10-CM | POA: Diagnosis not present

## 2016-01-26 DIAGNOSIS — N189 Chronic kidney disease, unspecified: Secondary | ICD-10-CM | POA: Diagnosis not present

## 2016-01-31 ENCOUNTER — Ambulatory Visit
Admission: RE | Admit: 2016-01-31 | Discharge: 2016-01-31 | Disposition: A | Discharge status: Home / Self Care | Payer: Medicare HMO | Source: Ambulatory Visit | Attending: Radiation Oncology | Admitting: Radiation Oncology

## 2016-01-31 DIAGNOSIS — K219 Gastro-esophageal reflux disease without esophagitis: Secondary | ICD-10-CM | POA: Diagnosis not present

## 2016-01-31 DIAGNOSIS — I2510 Atherosclerotic heart disease of native coronary artery without angina pectoris: Secondary | ICD-10-CM | POA: Diagnosis not present

## 2016-01-31 DIAGNOSIS — Z87891 Personal history of nicotine dependence: Secondary | ICD-10-CM | POA: Diagnosis not present

## 2016-01-31 DIAGNOSIS — J449 Chronic obstructive pulmonary disease, unspecified: Secondary | ICD-10-CM | POA: Diagnosis not present

## 2016-01-31 DIAGNOSIS — R350 Frequency of micturition: Secondary | ICD-10-CM | POA: Diagnosis not present

## 2016-01-31 DIAGNOSIS — E7800 Pure hypercholesterolemia, unspecified: Secondary | ICD-10-CM | POA: Diagnosis not present

## 2016-01-31 DIAGNOSIS — I129 Hypertensive chronic kidney disease with stage 1 through stage 4 chronic kidney disease, or unspecified chronic kidney disease: Secondary | ICD-10-CM | POA: Diagnosis not present

## 2016-01-31 DIAGNOSIS — N189 Chronic kidney disease, unspecified: Secondary | ICD-10-CM | POA: Diagnosis not present

## 2016-01-31 DIAGNOSIS — C61 Malignant neoplasm of prostate: Secondary | ICD-10-CM | POA: Diagnosis not present

## 2016-01-31 DIAGNOSIS — K2270 Barrett's esophagus without dysplasia: Secondary | ICD-10-CM | POA: Diagnosis not present

## 2016-01-31 DIAGNOSIS — R3915 Urgency of urination: Secondary | ICD-10-CM | POA: Diagnosis not present

## 2016-02-01 ENCOUNTER — Ambulatory Visit
Admission: RE | Admit: 2016-02-01 | Discharge: 2016-02-01 | Disposition: A | Discharge status: Home / Self Care | Payer: Medicare HMO | Source: Ambulatory Visit | Attending: Radiation Oncology | Admitting: Radiation Oncology

## 2016-02-01 DIAGNOSIS — C61 Malignant neoplasm of prostate: Secondary | ICD-10-CM | POA: Diagnosis not present

## 2016-02-01 DIAGNOSIS — K219 Gastro-esophageal reflux disease without esophagitis: Secondary | ICD-10-CM | POA: Diagnosis not present

## 2016-02-01 DIAGNOSIS — J449 Chronic obstructive pulmonary disease, unspecified: Secondary | ICD-10-CM | POA: Diagnosis not present

## 2016-02-01 DIAGNOSIS — R350 Frequency of micturition: Secondary | ICD-10-CM | POA: Diagnosis not present

## 2016-02-01 DIAGNOSIS — I129 Hypertensive chronic kidney disease with stage 1 through stage 4 chronic kidney disease, or unspecified chronic kidney disease: Secondary | ICD-10-CM | POA: Diagnosis not present

## 2016-02-01 DIAGNOSIS — K2270 Barrett's esophagus without dysplasia: Secondary | ICD-10-CM | POA: Diagnosis not present

## 2016-02-01 DIAGNOSIS — N189 Chronic kidney disease, unspecified: Secondary | ICD-10-CM | POA: Diagnosis not present

## 2016-02-01 DIAGNOSIS — E7800 Pure hypercholesterolemia, unspecified: Secondary | ICD-10-CM | POA: Diagnosis not present

## 2016-02-01 DIAGNOSIS — Z87891 Personal history of nicotine dependence: Secondary | ICD-10-CM | POA: Diagnosis not present

## 2016-02-01 DIAGNOSIS — I2510 Atherosclerotic heart disease of native coronary artery without angina pectoris: Secondary | ICD-10-CM | POA: Diagnosis not present

## 2016-02-01 DIAGNOSIS — R3915 Urgency of urination: Secondary | ICD-10-CM | POA: Diagnosis not present

## 2016-02-02 ENCOUNTER — Ambulatory Visit
Admission: RE | Admit: 2016-02-02 | Discharge: 2016-02-02 | Disposition: A | Discharge status: Home / Self Care | Payer: Medicare HMO | Source: Ambulatory Visit | Attending: Radiation Oncology | Admitting: Radiation Oncology

## 2016-02-02 DIAGNOSIS — C61 Malignant neoplasm of prostate: Secondary | ICD-10-CM | POA: Diagnosis not present

## 2016-02-02 DIAGNOSIS — K219 Gastro-esophageal reflux disease without esophagitis: Secondary | ICD-10-CM | POA: Diagnosis not present

## 2016-02-02 DIAGNOSIS — Z87891 Personal history of nicotine dependence: Secondary | ICD-10-CM | POA: Diagnosis not present

## 2016-02-02 DIAGNOSIS — R350 Frequency of micturition: Secondary | ICD-10-CM | POA: Diagnosis not present

## 2016-02-02 DIAGNOSIS — N189 Chronic kidney disease, unspecified: Secondary | ICD-10-CM | POA: Diagnosis not present

## 2016-02-02 DIAGNOSIS — J449 Chronic obstructive pulmonary disease, unspecified: Secondary | ICD-10-CM | POA: Diagnosis not present

## 2016-02-02 DIAGNOSIS — I129 Hypertensive chronic kidney disease with stage 1 through stage 4 chronic kidney disease, or unspecified chronic kidney disease: Secondary | ICD-10-CM | POA: Diagnosis not present

## 2016-02-02 DIAGNOSIS — E7800 Pure hypercholesterolemia, unspecified: Secondary | ICD-10-CM | POA: Diagnosis not present

## 2016-02-02 DIAGNOSIS — K2270 Barrett's esophagus without dysplasia: Secondary | ICD-10-CM | POA: Diagnosis not present

## 2016-02-02 DIAGNOSIS — I2510 Atherosclerotic heart disease of native coronary artery without angina pectoris: Secondary | ICD-10-CM | POA: Diagnosis not present

## 2016-02-02 DIAGNOSIS — R3915 Urgency of urination: Secondary | ICD-10-CM | POA: Diagnosis not present

## 2016-02-03 ENCOUNTER — Inpatient Hospital Stay: Payer: Medicare HMO

## 2016-02-03 ENCOUNTER — Ambulatory Visit
Admission: RE | Admit: 2016-02-03 | Discharge: 2016-02-03 | Disposition: A | Discharge status: Home / Self Care | Payer: Medicare HMO | Source: Ambulatory Visit | Attending: Radiation Oncology | Admitting: Radiation Oncology

## 2016-02-03 DIAGNOSIS — I129 Hypertensive chronic kidney disease with stage 1 through stage 4 chronic kidney disease, or unspecified chronic kidney disease: Secondary | ICD-10-CM | POA: Diagnosis not present

## 2016-02-03 DIAGNOSIS — R350 Frequency of micturition: Secondary | ICD-10-CM | POA: Diagnosis not present

## 2016-02-03 DIAGNOSIS — C61 Malignant neoplasm of prostate: Secondary | ICD-10-CM | POA: Diagnosis not present

## 2016-02-03 DIAGNOSIS — K2270 Barrett's esophagus without dysplasia: Secondary | ICD-10-CM | POA: Diagnosis not present

## 2016-02-03 DIAGNOSIS — I2510 Atherosclerotic heart disease of native coronary artery without angina pectoris: Secondary | ICD-10-CM | POA: Diagnosis not present

## 2016-02-03 DIAGNOSIS — K219 Gastro-esophageal reflux disease without esophagitis: Secondary | ICD-10-CM | POA: Diagnosis not present

## 2016-02-03 DIAGNOSIS — Z87891 Personal history of nicotine dependence: Secondary | ICD-10-CM | POA: Diagnosis not present

## 2016-02-03 DIAGNOSIS — J449 Chronic obstructive pulmonary disease, unspecified: Secondary | ICD-10-CM | POA: Diagnosis not present

## 2016-02-03 DIAGNOSIS — E7800 Pure hypercholesterolemia, unspecified: Secondary | ICD-10-CM | POA: Diagnosis not present

## 2016-02-03 DIAGNOSIS — R3915 Urgency of urination: Secondary | ICD-10-CM | POA: Diagnosis not present

## 2016-02-03 DIAGNOSIS — N189 Chronic kidney disease, unspecified: Secondary | ICD-10-CM | POA: Diagnosis not present

## 2016-02-03 LAB — CBC
HEMATOCRIT: 39.4 % — AB (ref 40.0–52.0)
HEMOGLOBIN: 13.5 g/dL (ref 13.0–18.0)
MCH: 29.6 pg (ref 26.0–34.0)
MCHC: 34.3 g/dL (ref 32.0–36.0)
MCV: 86.3 fL (ref 80.0–100.0)
Platelets: 159 10*3/uL (ref 150–440)
RBC: 4.57 MIL/uL (ref 4.40–5.90)
RDW: 13.7 % (ref 11.5–14.5)
WBC: 4.2 10*3/uL (ref 3.8–10.6)

## 2016-02-04 ENCOUNTER — Ambulatory Visit
Admission: RE | Admit: 2016-02-04 | Discharge: 2016-02-04 | Disposition: A | Discharge status: Home / Self Care | Payer: Medicare HMO | Source: Ambulatory Visit | Attending: Radiation Oncology | Admitting: Radiation Oncology

## 2016-02-04 DIAGNOSIS — R3915 Urgency of urination: Secondary | ICD-10-CM | POA: Diagnosis not present

## 2016-02-04 DIAGNOSIS — I2510 Atherosclerotic heart disease of native coronary artery without angina pectoris: Secondary | ICD-10-CM | POA: Diagnosis not present

## 2016-02-04 DIAGNOSIS — K2270 Barrett's esophagus without dysplasia: Secondary | ICD-10-CM | POA: Diagnosis not present

## 2016-02-04 DIAGNOSIS — R350 Frequency of micturition: Secondary | ICD-10-CM | POA: Diagnosis not present

## 2016-02-04 DIAGNOSIS — E7800 Pure hypercholesterolemia, unspecified: Secondary | ICD-10-CM | POA: Diagnosis not present

## 2016-02-04 DIAGNOSIS — C61 Malignant neoplasm of prostate: Secondary | ICD-10-CM | POA: Diagnosis not present

## 2016-02-04 DIAGNOSIS — Z87891 Personal history of nicotine dependence: Secondary | ICD-10-CM | POA: Diagnosis not present

## 2016-02-04 DIAGNOSIS — K219 Gastro-esophageal reflux disease without esophagitis: Secondary | ICD-10-CM | POA: Diagnosis not present

## 2016-02-04 DIAGNOSIS — I129 Hypertensive chronic kidney disease with stage 1 through stage 4 chronic kidney disease, or unspecified chronic kidney disease: Secondary | ICD-10-CM | POA: Diagnosis not present

## 2016-02-04 DIAGNOSIS — J449 Chronic obstructive pulmonary disease, unspecified: Secondary | ICD-10-CM | POA: Diagnosis not present

## 2016-02-04 DIAGNOSIS — N189 Chronic kidney disease, unspecified: Secondary | ICD-10-CM | POA: Diagnosis not present

## 2016-02-07 ENCOUNTER — Ambulatory Visit
Admission: RE | Admit: 2016-02-07 | Discharge: 2016-02-07 | Disposition: A | Discharge status: Home / Self Care | Payer: Medicare HMO | Source: Ambulatory Visit | Attending: Radiation Oncology | Admitting: Radiation Oncology

## 2016-02-07 DIAGNOSIS — I129 Hypertensive chronic kidney disease with stage 1 through stage 4 chronic kidney disease, or unspecified chronic kidney disease: Secondary | ICD-10-CM | POA: Diagnosis not present

## 2016-02-07 DIAGNOSIS — J449 Chronic obstructive pulmonary disease, unspecified: Secondary | ICD-10-CM | POA: Diagnosis not present

## 2016-02-07 DIAGNOSIS — K2270 Barrett's esophagus without dysplasia: Secondary | ICD-10-CM | POA: Diagnosis not present

## 2016-02-07 DIAGNOSIS — K219 Gastro-esophageal reflux disease without esophagitis: Secondary | ICD-10-CM | POA: Diagnosis not present

## 2016-02-07 DIAGNOSIS — C61 Malignant neoplasm of prostate: Secondary | ICD-10-CM | POA: Diagnosis not present

## 2016-02-07 DIAGNOSIS — R350 Frequency of micturition: Secondary | ICD-10-CM | POA: Diagnosis not present

## 2016-02-07 DIAGNOSIS — I2510 Atherosclerotic heart disease of native coronary artery without angina pectoris: Secondary | ICD-10-CM | POA: Diagnosis not present

## 2016-02-07 DIAGNOSIS — R3915 Urgency of urination: Secondary | ICD-10-CM | POA: Diagnosis not present

## 2016-02-07 DIAGNOSIS — N189 Chronic kidney disease, unspecified: Secondary | ICD-10-CM | POA: Diagnosis not present

## 2016-02-07 DIAGNOSIS — Z87891 Personal history of nicotine dependence: Secondary | ICD-10-CM | POA: Diagnosis not present

## 2016-02-07 DIAGNOSIS — E7800 Pure hypercholesterolemia, unspecified: Secondary | ICD-10-CM | POA: Diagnosis not present

## 2016-02-08 ENCOUNTER — Ambulatory Visit
Admission: RE | Admit: 2016-02-08 | Discharge: 2016-02-08 | Disposition: A | Discharge status: Home / Self Care | Payer: Medicare HMO | Source: Ambulatory Visit | Attending: Radiation Oncology | Admitting: Radiation Oncology

## 2016-02-08 DIAGNOSIS — J449 Chronic obstructive pulmonary disease, unspecified: Secondary | ICD-10-CM | POA: Diagnosis not present

## 2016-02-08 DIAGNOSIS — I2510 Atherosclerotic heart disease of native coronary artery without angina pectoris: Secondary | ICD-10-CM | POA: Diagnosis not present

## 2016-02-08 DIAGNOSIS — K2270 Barrett's esophagus without dysplasia: Secondary | ICD-10-CM | POA: Diagnosis not present

## 2016-02-08 DIAGNOSIS — C61 Malignant neoplasm of prostate: Secondary | ICD-10-CM | POA: Diagnosis not present

## 2016-02-08 DIAGNOSIS — N189 Chronic kidney disease, unspecified: Secondary | ICD-10-CM | POA: Diagnosis not present

## 2016-02-08 DIAGNOSIS — E7800 Pure hypercholesterolemia, unspecified: Secondary | ICD-10-CM | POA: Diagnosis not present

## 2016-02-08 DIAGNOSIS — R350 Frequency of micturition: Secondary | ICD-10-CM | POA: Diagnosis not present

## 2016-02-08 DIAGNOSIS — Z87891 Personal history of nicotine dependence: Secondary | ICD-10-CM | POA: Diagnosis not present

## 2016-02-08 DIAGNOSIS — R3915 Urgency of urination: Secondary | ICD-10-CM | POA: Diagnosis not present

## 2016-02-08 DIAGNOSIS — K219 Gastro-esophageal reflux disease without esophagitis: Secondary | ICD-10-CM | POA: Diagnosis not present

## 2016-02-08 DIAGNOSIS — I129 Hypertensive chronic kidney disease with stage 1 through stage 4 chronic kidney disease, or unspecified chronic kidney disease: Secondary | ICD-10-CM | POA: Diagnosis not present

## 2016-02-09 ENCOUNTER — Ambulatory Visit
Admission: RE | Admit: 2016-02-09 | Discharge: 2016-02-09 | Disposition: A | Discharge status: Home / Self Care | Payer: Medicare HMO | Source: Ambulatory Visit | Attending: Radiation Oncology | Admitting: Radiation Oncology

## 2016-02-09 DIAGNOSIS — I2510 Atherosclerotic heart disease of native coronary artery without angina pectoris: Secondary | ICD-10-CM | POA: Diagnosis not present

## 2016-02-09 DIAGNOSIS — C61 Malignant neoplasm of prostate: Secondary | ICD-10-CM | POA: Diagnosis not present

## 2016-02-09 DIAGNOSIS — I129 Hypertensive chronic kidney disease with stage 1 through stage 4 chronic kidney disease, or unspecified chronic kidney disease: Secondary | ICD-10-CM | POA: Diagnosis not present

## 2016-02-09 DIAGNOSIS — N189 Chronic kidney disease, unspecified: Secondary | ICD-10-CM | POA: Diagnosis not present

## 2016-02-09 DIAGNOSIS — R350 Frequency of micturition: Secondary | ICD-10-CM | POA: Diagnosis not present

## 2016-02-09 DIAGNOSIS — K2270 Barrett's esophagus without dysplasia: Secondary | ICD-10-CM | POA: Diagnosis not present

## 2016-02-09 DIAGNOSIS — E7800 Pure hypercholesterolemia, unspecified: Secondary | ICD-10-CM | POA: Diagnosis not present

## 2016-02-09 DIAGNOSIS — R3915 Urgency of urination: Secondary | ICD-10-CM | POA: Diagnosis not present

## 2016-02-09 DIAGNOSIS — K219 Gastro-esophageal reflux disease without esophagitis: Secondary | ICD-10-CM | POA: Diagnosis not present

## 2016-02-09 DIAGNOSIS — J449 Chronic obstructive pulmonary disease, unspecified: Secondary | ICD-10-CM | POA: Diagnosis not present

## 2016-02-09 DIAGNOSIS — Z87891 Personal history of nicotine dependence: Secondary | ICD-10-CM | POA: Diagnosis not present

## 2016-02-10 ENCOUNTER — Ambulatory Visit
Admission: RE | Admit: 2016-02-10 | Discharge: 2016-02-10 | Disposition: A | Discharge status: Home / Self Care | Payer: Medicare HMO | Source: Ambulatory Visit | Attending: Radiation Oncology | Admitting: Radiation Oncology

## 2016-02-10 DIAGNOSIS — I129 Hypertensive chronic kidney disease with stage 1 through stage 4 chronic kidney disease, or unspecified chronic kidney disease: Secondary | ICD-10-CM | POA: Diagnosis not present

## 2016-02-10 DIAGNOSIS — N189 Chronic kidney disease, unspecified: Secondary | ICD-10-CM | POA: Diagnosis not present

## 2016-02-10 DIAGNOSIS — E7800 Pure hypercholesterolemia, unspecified: Secondary | ICD-10-CM | POA: Diagnosis not present

## 2016-02-10 DIAGNOSIS — I2510 Atherosclerotic heart disease of native coronary artery without angina pectoris: Secondary | ICD-10-CM | POA: Diagnosis not present

## 2016-02-10 DIAGNOSIS — K2270 Barrett's esophagus without dysplasia: Secondary | ICD-10-CM | POA: Diagnosis not present

## 2016-02-10 DIAGNOSIS — Z87891 Personal history of nicotine dependence: Secondary | ICD-10-CM | POA: Diagnosis not present

## 2016-02-10 DIAGNOSIS — K219 Gastro-esophageal reflux disease without esophagitis: Secondary | ICD-10-CM | POA: Diagnosis not present

## 2016-02-10 DIAGNOSIS — J449 Chronic obstructive pulmonary disease, unspecified: Secondary | ICD-10-CM | POA: Diagnosis not present

## 2016-02-10 DIAGNOSIS — R350 Frequency of micturition: Secondary | ICD-10-CM | POA: Diagnosis not present

## 2016-02-10 DIAGNOSIS — C61 Malignant neoplasm of prostate: Secondary | ICD-10-CM | POA: Diagnosis not present

## 2016-02-10 DIAGNOSIS — R3915 Urgency of urination: Secondary | ICD-10-CM | POA: Diagnosis not present

## 2016-02-11 ENCOUNTER — Ambulatory Visit
Admission: RE | Admit: 2016-02-11 | Discharge: 2016-02-11 | Disposition: A | Discharge status: Home / Self Care | Payer: Medicare HMO | Source: Ambulatory Visit | Attending: Radiation Oncology | Admitting: Radiation Oncology

## 2016-02-11 DIAGNOSIS — Z87891 Personal history of nicotine dependence: Secondary | ICD-10-CM | POA: Diagnosis not present

## 2016-02-11 DIAGNOSIS — R3915 Urgency of urination: Secondary | ICD-10-CM | POA: Diagnosis not present

## 2016-02-11 DIAGNOSIS — K219 Gastro-esophageal reflux disease without esophagitis: Secondary | ICD-10-CM | POA: Diagnosis not present

## 2016-02-11 DIAGNOSIS — K2270 Barrett's esophagus without dysplasia: Secondary | ICD-10-CM | POA: Diagnosis not present

## 2016-02-11 DIAGNOSIS — R350 Frequency of micturition: Secondary | ICD-10-CM | POA: Diagnosis not present

## 2016-02-11 DIAGNOSIS — N189 Chronic kidney disease, unspecified: Secondary | ICD-10-CM | POA: Diagnosis not present

## 2016-02-11 DIAGNOSIS — I2510 Atherosclerotic heart disease of native coronary artery without angina pectoris: Secondary | ICD-10-CM | POA: Diagnosis not present

## 2016-02-11 DIAGNOSIS — C61 Malignant neoplasm of prostate: Secondary | ICD-10-CM | POA: Diagnosis not present

## 2016-02-11 DIAGNOSIS — J449 Chronic obstructive pulmonary disease, unspecified: Secondary | ICD-10-CM | POA: Diagnosis not present

## 2016-02-11 DIAGNOSIS — I129 Hypertensive chronic kidney disease with stage 1 through stage 4 chronic kidney disease, or unspecified chronic kidney disease: Secondary | ICD-10-CM | POA: Diagnosis not present

## 2016-02-11 DIAGNOSIS — E7800 Pure hypercholesterolemia, unspecified: Secondary | ICD-10-CM | POA: Diagnosis not present

## 2016-02-14 ENCOUNTER — Ambulatory Visit
Admission: RE | Admit: 2016-02-14 | Discharge: 2016-02-14 | Disposition: A | Discharge status: Home / Self Care | Payer: Medicare HMO | Source: Ambulatory Visit | Attending: Radiation Oncology | Admitting: Radiation Oncology

## 2016-02-14 DIAGNOSIS — J449 Chronic obstructive pulmonary disease, unspecified: Secondary | ICD-10-CM | POA: Diagnosis not present

## 2016-02-14 DIAGNOSIS — K219 Gastro-esophageal reflux disease without esophagitis: Secondary | ICD-10-CM | POA: Diagnosis not present

## 2016-02-14 DIAGNOSIS — E7800 Pure hypercholesterolemia, unspecified: Secondary | ICD-10-CM | POA: Diagnosis not present

## 2016-02-14 DIAGNOSIS — R350 Frequency of micturition: Secondary | ICD-10-CM | POA: Diagnosis not present

## 2016-02-14 DIAGNOSIS — K2270 Barrett's esophagus without dysplasia: Secondary | ICD-10-CM | POA: Diagnosis not present

## 2016-02-14 DIAGNOSIS — I2510 Atherosclerotic heart disease of native coronary artery without angina pectoris: Secondary | ICD-10-CM | POA: Diagnosis not present

## 2016-02-14 DIAGNOSIS — N189 Chronic kidney disease, unspecified: Secondary | ICD-10-CM | POA: Diagnosis not present

## 2016-02-14 DIAGNOSIS — Z87891 Personal history of nicotine dependence: Secondary | ICD-10-CM | POA: Diagnosis not present

## 2016-02-14 DIAGNOSIS — I129 Hypertensive chronic kidney disease with stage 1 through stage 4 chronic kidney disease, or unspecified chronic kidney disease: Secondary | ICD-10-CM | POA: Diagnosis not present

## 2016-02-14 DIAGNOSIS — R3915 Urgency of urination: Secondary | ICD-10-CM | POA: Diagnosis not present

## 2016-02-14 DIAGNOSIS — C61 Malignant neoplasm of prostate: Secondary | ICD-10-CM | POA: Diagnosis not present

## 2016-02-15 ENCOUNTER — Ambulatory Visit
Admission: RE | Admit: 2016-02-15 | Discharge: 2016-02-15 | Disposition: A | Discharge status: Home / Self Care | Payer: Medicare HMO | Source: Ambulatory Visit | Attending: Radiation Oncology | Admitting: Radiation Oncology

## 2016-02-15 DIAGNOSIS — I2510 Atherosclerotic heart disease of native coronary artery without angina pectoris: Secondary | ICD-10-CM | POA: Diagnosis not present

## 2016-02-15 DIAGNOSIS — J449 Chronic obstructive pulmonary disease, unspecified: Secondary | ICD-10-CM | POA: Diagnosis not present

## 2016-02-15 DIAGNOSIS — N189 Chronic kidney disease, unspecified: Secondary | ICD-10-CM | POA: Diagnosis not present

## 2016-02-15 DIAGNOSIS — R350 Frequency of micturition: Secondary | ICD-10-CM | POA: Diagnosis not present

## 2016-02-15 DIAGNOSIS — K219 Gastro-esophageal reflux disease without esophagitis: Secondary | ICD-10-CM | POA: Diagnosis not present

## 2016-02-15 DIAGNOSIS — I129 Hypertensive chronic kidney disease with stage 1 through stage 4 chronic kidney disease, or unspecified chronic kidney disease: Secondary | ICD-10-CM | POA: Diagnosis not present

## 2016-02-15 DIAGNOSIS — R3915 Urgency of urination: Secondary | ICD-10-CM | POA: Diagnosis not present

## 2016-02-15 DIAGNOSIS — K2270 Barrett's esophagus without dysplasia: Secondary | ICD-10-CM | POA: Diagnosis not present

## 2016-02-15 DIAGNOSIS — Z87891 Personal history of nicotine dependence: Secondary | ICD-10-CM | POA: Diagnosis not present

## 2016-02-15 DIAGNOSIS — C61 Malignant neoplasm of prostate: Secondary | ICD-10-CM | POA: Diagnosis not present

## 2016-02-15 DIAGNOSIS — E7800 Pure hypercholesterolemia, unspecified: Secondary | ICD-10-CM | POA: Diagnosis not present

## 2016-02-16 ENCOUNTER — Ambulatory Visit
Admission: RE | Admit: 2016-02-16 | Discharge: 2016-02-16 | Disposition: A | Discharge status: Home / Self Care | Payer: Medicare HMO | Source: Ambulatory Visit | Attending: Radiation Oncology | Admitting: Radiation Oncology

## 2016-02-16 DIAGNOSIS — K2270 Barrett's esophagus without dysplasia: Secondary | ICD-10-CM | POA: Diagnosis not present

## 2016-02-16 DIAGNOSIS — Z87891 Personal history of nicotine dependence: Secondary | ICD-10-CM | POA: Diagnosis not present

## 2016-02-16 DIAGNOSIS — E7800 Pure hypercholesterolemia, unspecified: Secondary | ICD-10-CM | POA: Diagnosis not present

## 2016-02-16 DIAGNOSIS — I2510 Atherosclerotic heart disease of native coronary artery without angina pectoris: Secondary | ICD-10-CM | POA: Diagnosis not present

## 2016-02-16 DIAGNOSIS — N189 Chronic kidney disease, unspecified: Secondary | ICD-10-CM | POA: Diagnosis not present

## 2016-02-16 DIAGNOSIS — R3915 Urgency of urination: Secondary | ICD-10-CM | POA: Diagnosis not present

## 2016-02-16 DIAGNOSIS — C61 Malignant neoplasm of prostate: Secondary | ICD-10-CM | POA: Diagnosis not present

## 2016-02-16 DIAGNOSIS — I129 Hypertensive chronic kidney disease with stage 1 through stage 4 chronic kidney disease, or unspecified chronic kidney disease: Secondary | ICD-10-CM | POA: Diagnosis not present

## 2016-02-16 DIAGNOSIS — R350 Frequency of micturition: Secondary | ICD-10-CM | POA: Diagnosis not present

## 2016-02-16 DIAGNOSIS — K219 Gastro-esophageal reflux disease without esophagitis: Secondary | ICD-10-CM | POA: Diagnosis not present

## 2016-02-16 DIAGNOSIS — J449 Chronic obstructive pulmonary disease, unspecified: Secondary | ICD-10-CM | POA: Diagnosis not present

## 2016-02-17 ENCOUNTER — Inpatient Hospital Stay: Payer: Medicare HMO | Attending: Radiation Oncology

## 2016-02-17 ENCOUNTER — Ambulatory Visit
Admission: RE | Admit: 2016-02-17 | Discharge: 2016-02-17 | Disposition: A | Discharge status: Home / Self Care | Payer: Medicare HMO | Source: Ambulatory Visit | Attending: Radiation Oncology | Admitting: Radiation Oncology

## 2016-02-17 DIAGNOSIS — I2510 Atherosclerotic heart disease of native coronary artery without angina pectoris: Secondary | ICD-10-CM | POA: Diagnosis not present

## 2016-02-17 DIAGNOSIS — Z510 Encounter for antineoplastic radiation therapy: Secondary | ICD-10-CM | POA: Diagnosis not present

## 2016-02-17 DIAGNOSIS — K219 Gastro-esophageal reflux disease without esophagitis: Secondary | ICD-10-CM | POA: Diagnosis not present

## 2016-02-17 DIAGNOSIS — R3915 Urgency of urination: Secondary | ICD-10-CM | POA: Diagnosis not present

## 2016-02-17 DIAGNOSIS — N189 Chronic kidney disease, unspecified: Secondary | ICD-10-CM | POA: Diagnosis not present

## 2016-02-17 DIAGNOSIS — J449 Chronic obstructive pulmonary disease, unspecified: Secondary | ICD-10-CM | POA: Diagnosis not present

## 2016-02-17 DIAGNOSIS — K2270 Barrett's esophagus without dysplasia: Secondary | ICD-10-CM | POA: Diagnosis not present

## 2016-02-17 DIAGNOSIS — Z87891 Personal history of nicotine dependence: Secondary | ICD-10-CM | POA: Diagnosis not present

## 2016-02-17 DIAGNOSIS — C61 Malignant neoplasm of prostate: Secondary | ICD-10-CM | POA: Diagnosis not present

## 2016-02-17 DIAGNOSIS — R350 Frequency of micturition: Secondary | ICD-10-CM | POA: Diagnosis not present

## 2016-02-17 DIAGNOSIS — I129 Hypertensive chronic kidney disease with stage 1 through stage 4 chronic kidney disease, or unspecified chronic kidney disease: Secondary | ICD-10-CM | POA: Diagnosis not present

## 2016-02-17 DIAGNOSIS — E7800 Pure hypercholesterolemia, unspecified: Secondary | ICD-10-CM | POA: Diagnosis not present

## 2016-02-17 LAB — CBC
HEMATOCRIT: 38.9 % — AB (ref 40.0–52.0)
HEMOGLOBIN: 13.2 g/dL (ref 13.0–18.0)
MCH: 29.4 pg (ref 26.0–34.0)
MCHC: 33.9 g/dL (ref 32.0–36.0)
MCV: 86.7 fL (ref 80.0–100.0)
Platelets: 156 10*3/uL (ref 150–440)
RBC: 4.49 MIL/uL (ref 4.40–5.90)
RDW: 14 % (ref 11.5–14.5)
WBC: 4.2 10*3/uL (ref 3.8–10.6)

## 2016-02-18 ENCOUNTER — Ambulatory Visit
Admission: RE | Admit: 2016-02-18 | Discharge: 2016-02-18 | Disposition: A | Discharge status: Home / Self Care | Payer: Medicare HMO | Source: Ambulatory Visit | Attending: Radiation Oncology | Admitting: Radiation Oncology

## 2016-02-18 DIAGNOSIS — E7800 Pure hypercholesterolemia, unspecified: Secondary | ICD-10-CM | POA: Diagnosis not present

## 2016-02-18 DIAGNOSIS — Z87891 Personal history of nicotine dependence: Secondary | ICD-10-CM | POA: Diagnosis not present

## 2016-02-18 DIAGNOSIS — K2270 Barrett's esophagus without dysplasia: Secondary | ICD-10-CM | POA: Diagnosis not present

## 2016-02-18 DIAGNOSIS — I129 Hypertensive chronic kidney disease with stage 1 through stage 4 chronic kidney disease, or unspecified chronic kidney disease: Secondary | ICD-10-CM | POA: Diagnosis not present

## 2016-02-18 DIAGNOSIS — I2510 Atherosclerotic heart disease of native coronary artery without angina pectoris: Secondary | ICD-10-CM | POA: Diagnosis not present

## 2016-02-18 DIAGNOSIS — R3915 Urgency of urination: Secondary | ICD-10-CM | POA: Diagnosis not present

## 2016-02-18 DIAGNOSIS — J449 Chronic obstructive pulmonary disease, unspecified: Secondary | ICD-10-CM | POA: Diagnosis not present

## 2016-02-18 DIAGNOSIS — C61 Malignant neoplasm of prostate: Secondary | ICD-10-CM | POA: Diagnosis not present

## 2016-02-18 DIAGNOSIS — K219 Gastro-esophageal reflux disease without esophagitis: Secondary | ICD-10-CM | POA: Diagnosis not present

## 2016-02-18 DIAGNOSIS — R350 Frequency of micturition: Secondary | ICD-10-CM | POA: Diagnosis not present

## 2016-02-18 DIAGNOSIS — N189 Chronic kidney disease, unspecified: Secondary | ICD-10-CM | POA: Diagnosis not present

## 2016-02-21 ENCOUNTER — Ambulatory Visit
Admission: RE | Admit: 2016-02-21 | Discharge: 2016-02-21 | Disposition: A | Discharge status: Home / Self Care | Payer: Medicare HMO | Source: Ambulatory Visit | Attending: Radiation Oncology | Admitting: Radiation Oncology

## 2016-02-21 DIAGNOSIS — Z87891 Personal history of nicotine dependence: Secondary | ICD-10-CM | POA: Diagnosis not present

## 2016-02-21 DIAGNOSIS — R3915 Urgency of urination: Secondary | ICD-10-CM | POA: Diagnosis not present

## 2016-02-21 DIAGNOSIS — I2510 Atherosclerotic heart disease of native coronary artery without angina pectoris: Secondary | ICD-10-CM | POA: Diagnosis not present

## 2016-02-21 DIAGNOSIS — J449 Chronic obstructive pulmonary disease, unspecified: Secondary | ICD-10-CM | POA: Diagnosis not present

## 2016-02-21 DIAGNOSIS — R350 Frequency of micturition: Secondary | ICD-10-CM | POA: Diagnosis not present

## 2016-02-21 DIAGNOSIS — K2270 Barrett's esophagus without dysplasia: Secondary | ICD-10-CM | POA: Diagnosis not present

## 2016-02-21 DIAGNOSIS — N189 Chronic kidney disease, unspecified: Secondary | ICD-10-CM | POA: Diagnosis not present

## 2016-02-21 DIAGNOSIS — C61 Malignant neoplasm of prostate: Secondary | ICD-10-CM | POA: Diagnosis not present

## 2016-02-21 DIAGNOSIS — I129 Hypertensive chronic kidney disease with stage 1 through stage 4 chronic kidney disease, or unspecified chronic kidney disease: Secondary | ICD-10-CM | POA: Diagnosis not present

## 2016-02-21 DIAGNOSIS — K219 Gastro-esophageal reflux disease without esophagitis: Secondary | ICD-10-CM | POA: Diagnosis not present

## 2016-02-21 DIAGNOSIS — E7800 Pure hypercholesterolemia, unspecified: Secondary | ICD-10-CM | POA: Diagnosis not present

## 2016-02-22 ENCOUNTER — Ambulatory Visit
Admission: RE | Admit: 2016-02-22 | Discharge: 2016-02-22 | Disposition: A | Discharge status: Home / Self Care | Payer: Medicare HMO | Source: Ambulatory Visit | Attending: Radiation Oncology | Admitting: Radiation Oncology

## 2016-02-22 DIAGNOSIS — Z510 Encounter for antineoplastic radiation therapy: Secondary | ICD-10-CM | POA: Diagnosis not present

## 2016-02-22 DIAGNOSIS — C61 Malignant neoplasm of prostate: Secondary | ICD-10-CM | POA: Diagnosis not present

## 2016-02-22 DIAGNOSIS — Z87891 Personal history of nicotine dependence: Secondary | ICD-10-CM | POA: Diagnosis not present

## 2016-02-23 ENCOUNTER — Ambulatory Visit
Admission: RE | Admit: 2016-02-23 | Discharge: 2016-02-23 | Disposition: A | Discharge status: Home / Self Care | Payer: Medicare HMO | Source: Ambulatory Visit | Attending: Radiation Oncology | Admitting: Radiation Oncology

## 2016-02-23 DIAGNOSIS — C61 Malignant neoplasm of prostate: Secondary | ICD-10-CM | POA: Diagnosis not present

## 2016-02-23 DIAGNOSIS — Z87891 Personal history of nicotine dependence: Secondary | ICD-10-CM | POA: Diagnosis not present

## 2016-02-23 DIAGNOSIS — Z510 Encounter for antineoplastic radiation therapy: Secondary | ICD-10-CM | POA: Diagnosis not present

## 2016-02-24 ENCOUNTER — Ambulatory Visit
Admission: RE | Admit: 2016-02-24 | Discharge: 2016-02-24 | Disposition: A | Discharge status: Home / Self Care | Payer: Medicare HMO | Source: Ambulatory Visit | Attending: Radiation Oncology | Admitting: Radiation Oncology

## 2016-02-24 DIAGNOSIS — Z87891 Personal history of nicotine dependence: Secondary | ICD-10-CM | POA: Diagnosis not present

## 2016-02-24 DIAGNOSIS — Z510 Encounter for antineoplastic radiation therapy: Secondary | ICD-10-CM | POA: Diagnosis not present

## 2016-02-24 DIAGNOSIS — C61 Malignant neoplasm of prostate: Secondary | ICD-10-CM | POA: Diagnosis not present

## 2016-02-25 ENCOUNTER — Ambulatory Visit
Admission: RE | Admit: 2016-02-25 | Discharge: 2016-02-25 | Disposition: A | Discharge status: Home / Self Care | Payer: Medicare HMO | Source: Ambulatory Visit | Attending: Radiation Oncology | Admitting: Radiation Oncology

## 2016-02-25 DIAGNOSIS — Z510 Encounter for antineoplastic radiation therapy: Secondary | ICD-10-CM | POA: Diagnosis not present

## 2016-02-25 DIAGNOSIS — Z87891 Personal history of nicotine dependence: Secondary | ICD-10-CM | POA: Diagnosis not present

## 2016-02-25 DIAGNOSIS — C61 Malignant neoplasm of prostate: Secondary | ICD-10-CM | POA: Diagnosis not present

## 2016-02-29 ENCOUNTER — Ambulatory Visit
Admission: RE | Admit: 2016-02-29 | Discharge: 2016-02-29 | Disposition: A | Discharge status: Home / Self Care | Payer: Medicare HMO | Source: Ambulatory Visit | Attending: Radiation Oncology | Admitting: Radiation Oncology

## 2016-02-29 DIAGNOSIS — Z510 Encounter for antineoplastic radiation therapy: Secondary | ICD-10-CM | POA: Diagnosis not present

## 2016-02-29 DIAGNOSIS — Z87891 Personal history of nicotine dependence: Secondary | ICD-10-CM | POA: Diagnosis not present

## 2016-02-29 DIAGNOSIS — C61 Malignant neoplasm of prostate: Secondary | ICD-10-CM | POA: Diagnosis not present

## 2016-03-01 ENCOUNTER — Ambulatory Visit
Admission: RE | Admit: 2016-03-01 | Discharge: 2016-03-01 | Disposition: A | Discharge status: Home / Self Care | Payer: Medicare HMO | Source: Ambulatory Visit | Attending: Radiation Oncology | Admitting: Radiation Oncology

## 2016-03-01 DIAGNOSIS — Z510 Encounter for antineoplastic radiation therapy: Secondary | ICD-10-CM | POA: Diagnosis not present

## 2016-03-01 DIAGNOSIS — C61 Malignant neoplasm of prostate: Secondary | ICD-10-CM | POA: Diagnosis not present

## 2016-03-01 DIAGNOSIS — Z87891 Personal history of nicotine dependence: Secondary | ICD-10-CM | POA: Diagnosis not present

## 2016-03-02 ENCOUNTER — Ambulatory Visit
Admission: RE | Admit: 2016-03-02 | Discharge: 2016-03-02 | Disposition: A | Discharge status: Home / Self Care | Payer: Medicare HMO | Source: Ambulatory Visit | Attending: Radiation Oncology | Admitting: Radiation Oncology

## 2016-03-02 DIAGNOSIS — C61 Malignant neoplasm of prostate: Secondary | ICD-10-CM | POA: Diagnosis not present

## 2016-03-02 DIAGNOSIS — Z87891 Personal history of nicotine dependence: Secondary | ICD-10-CM | POA: Diagnosis not present

## 2016-03-02 DIAGNOSIS — Z510 Encounter for antineoplastic radiation therapy: Secondary | ICD-10-CM | POA: Diagnosis not present

## 2016-03-03 ENCOUNTER — Ambulatory Visit
Admission: RE | Admit: 2016-03-03 | Discharge: 2016-03-03 | Disposition: A | Discharge status: Home / Self Care | Payer: Medicare HMO | Source: Ambulatory Visit | Attending: Radiation Oncology | Admitting: Radiation Oncology

## 2016-03-03 DIAGNOSIS — Z87891 Personal history of nicotine dependence: Secondary | ICD-10-CM | POA: Diagnosis not present

## 2016-03-03 DIAGNOSIS — C61 Malignant neoplasm of prostate: Secondary | ICD-10-CM | POA: Diagnosis not present

## 2016-03-03 DIAGNOSIS — Z510 Encounter for antineoplastic radiation therapy: Secondary | ICD-10-CM | POA: Diagnosis not present

## 2016-03-07 ENCOUNTER — Ambulatory Visit: Payer: Medicare HMO

## 2016-03-07 DIAGNOSIS — C61 Malignant neoplasm of prostate: Secondary | ICD-10-CM | POA: Diagnosis not present

## 2016-03-07 DIAGNOSIS — Z87891 Personal history of nicotine dependence: Secondary | ICD-10-CM | POA: Diagnosis not present

## 2016-03-08 ENCOUNTER — Ambulatory Visit: Payer: Medicare HMO

## 2016-03-08 DIAGNOSIS — Z87891 Personal history of nicotine dependence: Secondary | ICD-10-CM | POA: Diagnosis not present

## 2016-03-08 DIAGNOSIS — C61 Malignant neoplasm of prostate: Secondary | ICD-10-CM | POA: Diagnosis not present

## 2016-03-10 DIAGNOSIS — C61 Malignant neoplasm of prostate: Secondary | ICD-10-CM | POA: Diagnosis not present

## 2016-03-16 DIAGNOSIS — R3915 Urgency of urination: Secondary | ICD-10-CM | POA: Diagnosis not present

## 2016-03-16 DIAGNOSIS — C61 Malignant neoplasm of prostate: Secondary | ICD-10-CM | POA: Diagnosis not present

## 2016-04-05 DIAGNOSIS — K22719 Barrett's esophagus with dysplasia, unspecified: Secondary | ICD-10-CM | POA: Diagnosis not present

## 2016-04-06 DIAGNOSIS — L03032 Cellulitis of left toe: Secondary | ICD-10-CM | POA: Diagnosis not present

## 2016-04-07 DIAGNOSIS — Z8546 Personal history of malignant neoplasm of prostate: Secondary | ICD-10-CM | POA: Diagnosis not present

## 2016-04-07 DIAGNOSIS — G4730 Sleep apnea, unspecified: Secondary | ICD-10-CM | POA: Diagnosis not present

## 2016-04-07 DIAGNOSIS — I10 Essential (primary) hypertension: Secondary | ICD-10-CM | POA: Diagnosis not present

## 2016-04-07 DIAGNOSIS — Z6830 Body mass index (BMI) 30.0-30.9, adult: Secondary | ICD-10-CM | POA: Diagnosis not present

## 2016-04-07 DIAGNOSIS — E785 Hyperlipidemia, unspecified: Secondary | ICD-10-CM | POA: Diagnosis not present

## 2016-04-07 DIAGNOSIS — K219 Gastro-esophageal reflux disease without esophagitis: Secondary | ICD-10-CM | POA: Diagnosis not present

## 2016-04-07 DIAGNOSIS — J449 Chronic obstructive pulmonary disease, unspecified: Secondary | ICD-10-CM | POA: Diagnosis not present

## 2016-04-07 DIAGNOSIS — E669 Obesity, unspecified: Secondary | ICD-10-CM | POA: Diagnosis not present

## 2016-04-07 DIAGNOSIS — Z955 Presence of coronary angioplasty implant and graft: Secondary | ICD-10-CM | POA: Diagnosis not present

## 2016-04-07 DIAGNOSIS — K22719 Barrett's esophagus with dysplasia, unspecified: Secondary | ICD-10-CM | POA: Diagnosis not present

## 2016-04-07 DIAGNOSIS — Z87891 Personal history of nicotine dependence: Secondary | ICD-10-CM | POA: Diagnosis not present

## 2016-04-07 DIAGNOSIS — Z0000 Encounter for general adult medical examination without abnormal findings: Secondary | ICD-10-CM | POA: Diagnosis not present

## 2016-04-07 DIAGNOSIS — I2510 Atherosclerotic heart disease of native coronary artery without angina pectoris: Secondary | ICD-10-CM | POA: Diagnosis not present

## 2016-04-10 ENCOUNTER — Ambulatory Visit: Payer: Medicare HMO | Admitting: Radiation Oncology

## 2016-04-13 ENCOUNTER — Encounter: Payer: Self-pay | Admitting: Radiation Oncology

## 2016-04-13 ENCOUNTER — Ambulatory Visit
Admission: RE | Admit: 2016-04-13 | Discharge: 2016-04-13 | Disposition: A | Discharge status: Home / Self Care | Payer: Medicare HMO | Source: Ambulatory Visit | Attending: Radiation Oncology | Admitting: Radiation Oncology

## 2016-04-13 ENCOUNTER — Other Ambulatory Visit: Payer: Self-pay | Admitting: *Deleted

## 2016-04-13 VITALS — BP 150/83 | HR 57 | Temp 95.0°F | Resp 20 | Wt 209.9 lb

## 2016-04-13 DIAGNOSIS — C61 Malignant neoplasm of prostate: Secondary | ICD-10-CM

## 2016-04-13 DIAGNOSIS — Z923 Personal history of irradiation: Secondary | ICD-10-CM | POA: Insufficient documentation

## 2016-04-13 NOTE — Progress Notes (Signed)
Radiation Oncology Follow up Note  Name: Nathaniel Thompson   Date:   04/13/2016 MRN:  [id number] DOB: 13-Dec-1935    This 81 y.o. male presents to the clinic today for one-month follow-up status post I MRT radiation therapy for stage IIa adenocarcinoma the prostate.  REFERRING PROVIDER: Dion Body, MD  HPI: Patient is a 81 year old male now out 1 month having completed IM RT radiation therapy for stage IIa adenocarcinoma the prostate Gleason 7 (4+3) presenting the PSA of 7.8. He is seen today in routine follow-up is doing well. He specifically denies diarrhea dysuria or any other GI/GU complaints..  COMPLICATIONS OF TREATMENT: none  FOLLOW UP COMPLIANCE: keeps appointments   PHYSICAL EXAM:  BP (!) 150/83   Pulse (!) 57   Temp (!) 95 F (35 C)   Resp 20   Wt 209 lb 14.1 oz (95.2 kg)   BMI 30.11 kg/m  On rectal exam rectal sphincter tone is good. Prostate is smooth contracted without evidence of nodularity or mass. Sulcus is preserved bilaterally. No discrete nodularity is identified. No other rectal abnormalities are noted. Well-developed well-nourished patient in NAD. HEENT reveals PERLA, EOMI, discs not visualized.  Oral cavity is clear. No oral mucosal lesions are identified. Neck is clear without evidence of cervical or supraclavicular adenopathy. Lungs are clear to A&P. Cardiac examination is essentially unremarkable with regular rate and rhythm without murmur rub or thrill. Abdomen is benign with no organomegaly or masses noted. Motor sensory and DTR levels are equal and symmetric in the upper and lower extremities. Cranial nerves II through XII are grossly intact. Proprioception is intact. No peripheral adenopathy or edema is identified. No motor or sensory levels are noted. Crude visual fields are within normal range.  RADIOLOGY RESULTS: No current films for review  PLAN: At the present time he is doing well 1 month out with very little side effects or complaints. I've asked  to see him back in 4 months for follow-up at which time we'll obtain a PSA if not already performed. Patient comprehend my treatment plan well at his follow-up schedule. Patient knows to call with any concerns.  I would like to take this opportunity to thank you for allowing me to participate in the care of your patient.Armstead Peaks., MD

## 2016-04-20 DIAGNOSIS — L03032 Cellulitis of left toe: Secondary | ICD-10-CM | POA: Diagnosis not present

## 2016-05-09 DIAGNOSIS — I2510 Atherosclerotic heart disease of native coronary artery without angina pectoris: Secondary | ICD-10-CM | POA: Diagnosis not present

## 2016-05-09 DIAGNOSIS — E785 Hyperlipidemia, unspecified: Secondary | ICD-10-CM | POA: Diagnosis not present

## 2016-05-09 DIAGNOSIS — I10 Essential (primary) hypertension: Secondary | ICD-10-CM | POA: Diagnosis not present

## 2016-05-09 DIAGNOSIS — K210 Gastro-esophageal reflux disease with esophagitis: Secondary | ICD-10-CM | POA: Diagnosis not present

## 2016-05-09 DIAGNOSIS — Z9861 Coronary angioplasty status: Secondary | ICD-10-CM | POA: Diagnosis not present

## 2016-05-09 DIAGNOSIS — G4730 Sleep apnea, unspecified: Secondary | ICD-10-CM | POA: Diagnosis not present

## 2016-06-15 ENCOUNTER — Encounter: Payer: Self-pay | Admitting: *Deleted

## 2016-06-16 ENCOUNTER — Encounter
Admission: RE | Disposition: A | Discharge status: Home / Self Care | Payer: Self-pay | Source: Ambulatory Visit | Attending: Gastroenterology

## 2016-06-16 ENCOUNTER — Ambulatory Visit: Payer: Medicare HMO | Admitting: Anesthesiology

## 2016-06-16 ENCOUNTER — Ambulatory Visit
Admission: RE | Admit: 2016-06-16 | Discharge: 2016-06-16 | Disposition: A | Discharge status: Home / Self Care | Payer: Medicare HMO | Source: Ambulatory Visit | Attending: Gastroenterology | Admitting: Gastroenterology

## 2016-06-16 ENCOUNTER — Encounter: Payer: Self-pay | Admitting: *Deleted

## 2016-06-16 DIAGNOSIS — I110 Hypertensive heart disease with heart failure: Secondary | ICD-10-CM | POA: Diagnosis not present

## 2016-06-16 DIAGNOSIS — Z8673 Personal history of transient ischemic attack (TIA), and cerebral infarction without residual deficits: Secondary | ICD-10-CM | POA: Insufficient documentation

## 2016-06-16 DIAGNOSIS — Z87891 Personal history of nicotine dependence: Secondary | ICD-10-CM | POA: Diagnosis not present

## 2016-06-16 DIAGNOSIS — J449 Chronic obstructive pulmonary disease, unspecified: Secondary | ICD-10-CM | POA: Diagnosis not present

## 2016-06-16 DIAGNOSIS — K219 Gastro-esophageal reflux disease without esophagitis: Secondary | ICD-10-CM | POA: Diagnosis not present

## 2016-06-16 DIAGNOSIS — Z7982 Long term (current) use of aspirin: Secondary | ICD-10-CM | POA: Insufficient documentation

## 2016-06-16 DIAGNOSIS — Z8546 Personal history of malignant neoplasm of prostate: Secondary | ICD-10-CM | POA: Diagnosis not present

## 2016-06-16 DIAGNOSIS — Z792 Long term (current) use of antibiotics: Secondary | ICD-10-CM | POA: Insufficient documentation

## 2016-06-16 DIAGNOSIS — Z955 Presence of coronary angioplasty implant and graft: Secondary | ICD-10-CM | POA: Diagnosis not present

## 2016-06-16 DIAGNOSIS — K449 Diaphragmatic hernia without obstruction or gangrene: Secondary | ICD-10-CM | POA: Diagnosis not present

## 2016-06-16 DIAGNOSIS — I509 Heart failure, unspecified: Secondary | ICD-10-CM | POA: Diagnosis not present

## 2016-06-16 DIAGNOSIS — E7800 Pure hypercholesterolemia, unspecified: Secondary | ICD-10-CM | POA: Insufficient documentation

## 2016-06-16 DIAGNOSIS — I2510 Atherosclerotic heart disease of native coronary artery without angina pectoris: Secondary | ICD-10-CM | POA: Diagnosis not present

## 2016-06-16 DIAGNOSIS — K2970 Gastritis, unspecified, without bleeding: Secondary | ICD-10-CM | POA: Diagnosis not present

## 2016-06-16 DIAGNOSIS — Z85828 Personal history of other malignant neoplasm of skin: Secondary | ICD-10-CM | POA: Insufficient documentation

## 2016-06-16 DIAGNOSIS — Z7902 Long term (current) use of antithrombotics/antiplatelets: Secondary | ICD-10-CM | POA: Insufficient documentation

## 2016-06-16 DIAGNOSIS — K22710 Barrett's esophagus with low grade dysplasia: Secondary | ICD-10-CM | POA: Diagnosis present

## 2016-06-16 DIAGNOSIS — Z79899 Other long term (current) drug therapy: Secondary | ICD-10-CM | POA: Diagnosis not present

## 2016-06-16 DIAGNOSIS — K2270 Barrett's esophagus without dysplasia: Secondary | ICD-10-CM | POA: Diagnosis not present

## 2016-06-16 DIAGNOSIS — E119 Type 2 diabetes mellitus without complications: Secondary | ICD-10-CM | POA: Diagnosis not present

## 2016-06-16 HISTORY — PX: ESOPHAGOGASTRODUODENOSCOPY (EGD) WITH PROPOFOL: SHX5813

## 2016-06-16 HISTORY — DX: Diverticulosis of intestine, part unspecified, without perforation or abscess without bleeding: K57.90

## 2016-06-16 HISTORY — DX: Gastritis, unspecified, without bleeding: K29.70

## 2016-06-16 SURGERY — ESOPHAGOGASTRODUODENOSCOPY (EGD) WITH PROPOFOL
Anesthesia: General

## 2016-06-16 MED ORDER — MIDAZOLAM HCL 2 MG/2ML IJ SOLN
INTRAMUSCULAR | Status: AC
  Filled 2016-06-16: qty 2

## 2016-06-16 MED ORDER — EPHEDRINE SULFATE 50 MG/ML IJ SOLN
INTRAMUSCULAR | Status: DC | PRN
  Administered 2016-06-16: 15 mg via INTRAVENOUS

## 2016-06-16 MED ORDER — SODIUM CHLORIDE 0.9 % IV SOLN
INTRAVENOUS | Status: DC
  Administered 2016-06-16: 1000 mL via INTRAVENOUS

## 2016-06-16 MED ORDER — FENTANYL CITRATE (PF) 100 MCG/2ML IJ SOLN
INTRAMUSCULAR | Status: AC
  Filled 2016-06-16: qty 2

## 2016-06-16 MED ORDER — IPRATROPIUM-ALBUTEROL 0.5-2.5 (3) MG/3ML IN SOLN
3.0000 mL | Freq: Once | RESPIRATORY_TRACT | Status: AC
  Administered 2016-06-16: 3 mL via RESPIRATORY_TRACT

## 2016-06-16 MED ORDER — IPRATROPIUM-ALBUTEROL 0.5-2.5 (3) MG/3ML IN SOLN
RESPIRATORY_TRACT | Status: AC
  Filled 2016-06-16: qty 3

## 2016-06-16 MED ORDER — PROPOFOL 500 MG/50ML IV EMUL
INTRAVENOUS | Status: AC
  Filled 2016-06-16: qty 50

## 2016-06-16 MED ORDER — PROPOFOL 500 MG/50ML IV EMUL
INTRAVENOUS | Status: DC | PRN
  Administered 2016-06-16: 150 ug/kg/min via INTRAVENOUS

## 2016-06-16 MED ORDER — PROPOFOL 10 MG/ML IV BOLUS
INTRAVENOUS | Status: DC | PRN
  Administered 2016-06-16: 40 mg via INTRAVENOUS

## 2016-06-16 MED ORDER — SODIUM CHLORIDE 0.9 % IV SOLN: INTRAVENOUS | Status: DC

## 2016-06-16 MED ORDER — FENTANYL CITRATE (PF) 100 MCG/2ML IJ SOLN
INTRAMUSCULAR | Status: DC | PRN
  Administered 2016-06-16: 50 ug via INTRAVENOUS

## 2016-06-16 NOTE — Anesthesia Post-op Follow-up Note (Cosign Needed)
Anesthesia QCDR form completed.        

## 2016-06-16 NOTE — Transfer of Care (Signed)
Immediate Anesthesia Transfer of Care Note  Patient: Nathaniel Thompson  Procedure(s) Performed: Procedure(s): ESOPHAGOGASTRODUODENOSCOPY (EGD) WITH PROPOFOL (N/A)  Patient Location: PACU  Anesthesia Type:General  Level of Consciousness: sedated  Airway & Oxygen Therapy: Patient Spontanous Breathing and Patient connected to nasal cannula oxygen  Post-op Assessment: Report given to RN and Post -op Vital signs reviewed and stable  Post vital signs: Reviewed and stable  Last Vitals:  Vitals:   06/16/16 0857 06/16/16 1016  BP: 132/69 (!) 103/57  Pulse: (!) 49 (!) 55  Resp: 18 18  Temp: (!) 36 C (!) 36 C    Last Pain:  Vitals:   06/16/16 1016  TempSrc: Tympanic         Complications: No apparent anesthesia complications

## 2016-06-16 NOTE — H&P (Signed)
Outpatient short stay form Pre-procedure 06/16/2016 9:23 AM Lollie Sails MD  Primary Physician: Dr Dion Body  Reason for visit:  EGD  History of present illness:  Patient is a 81 year old male presenting today for follow-up on a history of Barrett's esophagus. He has had 2 sets of biopsies showing indeterminate for low grade dysplasia, negative for high-grade dysplasia. He is been placed on higher dose acid suppression and is returning today for follow-up.    Current Facility-Administered Medications:  .  0.9 %  sodium chloride infusion, , Intravenous, Continuous, Lollie Sails, MD, Last Rate: 20 mL/hr at 06/16/16 0913, 1,000 mL at 06/16/16 0913 .  0.9 %  sodium chloride infusion, , Intravenous, Continuous, Lollie Sails, MD .  ipratropium-albuterol (DUONEB) 0.5-2.5 (3) MG/3ML nebulizer solution, , , ,   Prescriptions Prior to Admission  Medication Sig Dispense Refill Last Dose  . clopidogrel (PLAVIX) 75 MG tablet Take 75 mg by mouth daily.   06/09/2016 at Unknown time  . amLODipine (NORVASC) 5 MG tablet Take 5 mg by mouth daily.   12/27/2015 at Unknown time  . aspirin 81 MG tablet Take 81 mg by mouth daily.   Taking  . cephALEXin (KEFLEX) 500 MG capsule    Taking  . fluticasone (FLONASE) 50 MCG/ACT nasal spray Place 2 sprays into both nostrils daily.   Taking  . Ipratropium-Albuterol (COMBIVENT) 20-100 MCG/ACT AERS respimat Inhale into the lungs.   Taking  . isosorbide mononitrate (IMDUR) 30 MG 24 hr tablet Take 30 mg by mouth daily.   Taking  . losartan (COZAAR) 50 MG tablet Take 50 mg by mouth daily.   Taking  . lovastatin (MEVACOR) 40 MG tablet Take 80 mg by mouth at bedtime.   Taking  . Multiple Vitamin (MULTI-VITAMINS) TABS Take by mouth.   Taking  . nitroGLYCERIN (NITROSTAT) 0.4 MG SL tablet Place 0.4 mg under the tongue every 5 (five) minutes as needed for chest pain.   Taking  . pantoprazole (PROTONIX) 40 MG tablet Take 40 mg by mouth daily.   Taking  .  ranitidine (ZANTAC) 150 MG tablet    Taking     Allergies  Allergen Reactions  . Atorvastatin Other (See Comments)    Achy joints     Past Medical History:  Diagnosis Date  . Allergic rhinitis   . Barrett's esophagus 10/21/13  . Chronic kidney disease    stones  . COPD (chronic obstructive pulmonary disease) (HCC)    mild COPD. former smoker  . Coronary artery disease   . Diabetes mellitus without complication ([place])   . Diverticulosis   . Gastritis   . GERD (gastroesophageal reflux disease)   . Hypercholesteremia   . Hypertension   . Prostate cancer ([place])   . Skin cancer    left cheek/lesion excised  . Sleep apnea   . TIA (transient ischemic attack)   . TIA (transient ischemic attack)     Review of systems:      Physical Exam    Heart and lungs:     HEENT: Normocephalic atraumatic eyes are anicteric    Other:     Pertinant exam for procedure: Soft nontender nondistended bowel sounds positive normoactive    Planned proceedures: EGD and indicated procedures. Patient also takes Plavix and has held that for a week. I have discussed the risks benefits and complications of procedures to include not limited to bleeding, infection, perforation and the risk of sedation and the patient wishes to proceed.  Lollie Sails, MD Gastroenterology 06/16/2016  9:23 AM

## 2016-06-16 NOTE — Anesthesia Preprocedure Evaluation (Signed)
Anesthesia Evaluation  Patient identified by MRN, date of birth, ID band Patient awake    Reviewed: Allergy & Precautions, H&P , NPO status , Patient's Chart, lab work & pertinent test results  History of Anesthesia Complications Negative for: history of anesthetic complications  Airway Mallampati: III  TM Distance: >3 FB Neck ROM: full    Dental  (+) Poor Dentition, Upper Dentures, Missing, Caps, Chipped   Pulmonary neg shortness of breath, sleep apnea , COPD, former smoker,    Pulmonary exam normal breath sounds clear to auscultation       Cardiovascular Exercise Tolerance: Good hypertension, (-) angina+ CAD, + Cardiac Stents and +CHF  (-) Past MI and (-) DOE Normal cardiovascular exam Rhythm:regular Rate:Normal     Neuro/Psych TIAnegative psych ROS   GI/Hepatic Neg liver ROS, GERD  ,  Endo/Other  diabetes, Type 2  Renal/GU Renal disease  negative genitourinary   Musculoskeletal   Abdominal   Peds  Hematology negative hematology ROS (+)   Anesthesia Other Findings Past Medical History: No date: Allergic rhinitis 10/21/13: Barrett's esophagus No date: Chronic kidney disease     Comment: stones No date: COPD (chronic obstructive pulmonary disease) (*     Comment: mild COPD. former smoker No date: Coronary artery disease No date: Diabetes mellitus without complication (HCC) No date: Diverticulosis No date: Gastritis No date: GERD (gastroesophageal reflux disease) No date: Hypercholesteremia No date: Hypertension No date: Prostate cancer ([place]) No date: Skin cancer     Comment: left cheek/lesion excised No date: Sleep apnea No date: TIA (transient ischemic attack) No date: TIA (transient ischemic attack)  Past Surgical History: No date: CARDIAC CATHETERIZATION 09/13/2015: COLONOSCOPY WITH PROPOFOL N/A     Comment: Procedure: COLONOSCOPY WITH PROPOFOL;                Surgeon: Lollie Sails, MD;   Location: [facility]              ENDOSCOPY;  Service: Endoscopy;  Laterality:               N/A; No date: CORONARY ANGIOPLASTY 09/13/2015: ESOPHAGOGASTRODUODENOSCOPY (EGD) WITH PROPOFOL N/A     Comment: Procedure: ESOPHAGOGASTRODUODENOSCOPY (EGD)               WITH PROPOFOL;  Surgeon: Lollie Sails, MD;              Location: [facility] ENDOSCOPY;  Service: Endoscopy;               Laterality: N/A; 12/28/2015: ESOPHAGOGASTRODUODENOSCOPY (EGD) WITH PROPOFOL N/A     Comment: Procedure: ESOPHAGOGASTRODUODENOSCOPY (EGD)               WITH PROPOFOL;  Surgeon: Lollie Sails, MD;              Location: [facility] ENDOSCOPY;  Service: Endoscopy;               Laterality: N/A; 2013: EYE SURGERY     Comment: CATARACT EXTRACTION No date: heart cath stent No date: PROSTATE BIOPSY No date: stents     Comment: multiple  BMI    Body Mass Index:  29.84 kg/m      Reproductive/Obstetrics negative OB ROS                             Anesthesia Physical Anesthesia Plan  ASA: III  Anesthesia Plan: General   Post-op Pain Management:    Induction:  Airway Management Planned:   Additional Equipment:   Intra-op Plan:   Post-operative Plan:   Informed Consent: I have reviewed the patients History and Physical, chart, labs and discussed the procedure including the risks, benefits and alternatives for the proposed anesthesia with the patient or authorized representative who has indicated his/her understanding and acceptance.   Dental Advisory Given  Plan Discussed with: Anesthesiologist, CRNA and Surgeon  Anesthesia Plan Comments:         Anesthesia Quick Evaluation

## 2016-06-16 NOTE — Anesthesia Postprocedure Evaluation (Signed)
Anesthesia Post Note  Patient: RACIEL CAFFREY  Procedure(s) Performed: Procedure(s) (LRB): ESOPHAGOGASTRODUODENOSCOPY (EGD) WITH PROPOFOL (N/A)  Patient location during evaluation: Endoscopy Anesthesia Type: General Level of consciousness: awake and alert Pain management: pain level controlled Vital Signs Assessment: post-procedure vital signs reviewed and stable Respiratory status: spontaneous breathing, nonlabored ventilation, respiratory function stable and patient connected to nasal cannula oxygen Cardiovascular status: blood pressure returned to baseline and stable Postop Assessment: no signs of nausea or vomiting Anesthetic complications: no     Last Vitals:  Vitals:   06/16/16 1036 06/16/16 1046  BP: (!) 91/57 129/74  Pulse: (!) 57 (!) 53  Resp: 18 (!) 21  Temp:      Last Pain:  Vitals:   06/16/16 1016  TempSrc: Tympanic                 Precious Haws Ryosuke Ericksen

## 2016-06-16 NOTE — Op Note (Signed)
[facility] Gastroenterology Patient Name: Nathaniel Thompson Procedure Date: 06/16/2016 9:37 AM MRN: [id number] Account #: [id number] Date of Birth: 04-Dec-1935 Admit Type: Outpatient Age: 81 Room: [facility] ENDO ROOM 1 Gender: Male Note Status: Finalized Procedure:            Upper GI endoscopy Indications:          Follow-up of Barrett's esophagus, Barrett's low grade                        dysplasia Providers:            Lollie Sails, MD Referring MD:         Dion Body (Referring MD) Medicines:            Monitored Anesthesia Care Complications:        No immediate complications. Procedure:            Pre-Anesthesia Assessment:                       - ASA Grade Assessment: III - A patient with severe                        systemic disease.                       After obtaining informed consent, the endoscope was                        passed under direct vision. Throughout the procedure,                        the patient's blood pressure, pulse, and oxygen                        saturations were monitored continuously. The Endoscope                        was introduced through the mouth, and advanced to the                        third part of duodenum. The upper GI endoscopy was                        accomplished without difficulty. The patient tolerated                        the procedure well. Findings:      There were esophageal mucosal changes secondary to established       long-segment Barrett's disease present in the lower third of the       esophagus. The maximum longitudinal extent of these mucosal changes was       5 cm in length. Mucosa was biopsied with a cold forceps for histology in       4 quadrants at intervals of 2 cm at 33, 35 and 37 cm from the incisors.       A total of 3 specimen bottles were sent to pathology. No other       abnormality seen.      Patchy minimal inflammation characterized by erythema was found in the   gastric antrum.      The cardia and gastric  fundus were normal on retroflexion.      A small hiatal hernia was found. The Z-line was a variable distance from       incisors; the hiatal hernia was sliding.      The examined duodenum was normal. Impression:           - Esophageal mucosal changes secondary to established                        long-segment Barrett's disease. Biopsied.                       - Gastritis.                       - Small hiatal hernia.                       - Normal examined duodenum. Recommendation:       - Await pathology results.                       - Continue present medications. Procedure Code(s):    --- Professional ---                       [phone], Esophagogastroduodenoscopy, flexible, transoral;                        with biopsy, single or multiple Diagnosis Code(s):    --- Professional ---                       K29.70, Gastritis, unspecified, without bleeding                       K44.9, Diaphragmatic hernia without obstruction or                        gangrene                       K22.710, Barrett's esophagus with low grade dysplasia CPT copyright 2016 American Medical Association. All rights reserved. The codes documented in this report are preliminary and upon coder review may  be revised to meet current compliance requirements. Lollie Sails, MD 06/16/2016 10:10:05 AM This report has been signed electronically. Number of Addenda: 0 Note Initiated On: 06/16/2016 9:37 AM      [facility]

## 2016-06-19 ENCOUNTER — Encounter: Payer: Self-pay | Admitting: Gastroenterology

## 2016-06-19 LAB — SURGICAL PATHOLOGY

## 2016-07-05 DIAGNOSIS — K2270 Barrett's esophagus without dysplasia: Secondary | ICD-10-CM | POA: Diagnosis not present

## 2016-07-12 ENCOUNTER — Ambulatory Visit (INDEPENDENT_AMBULATORY_CARE_PROVIDER_SITE_OTHER): Payer: Medicare HMO | Admitting: Internal Medicine

## 2016-07-12 ENCOUNTER — Telehealth: Payer: Self-pay | Admitting: *Deleted

## 2016-07-12 ENCOUNTER — Encounter: Payer: Self-pay | Admitting: Internal Medicine

## 2016-07-12 VITALS — BP 138/72 | HR 59 | Temp 97.4°F | Ht 68.5 in | Wt 212.4 lb

## 2016-07-12 DIAGNOSIS — E7800 Pure hypercholesterolemia, unspecified: Secondary | ICD-10-CM

## 2016-07-12 DIAGNOSIS — I2510 Atherosclerotic heart disease of native coronary artery without angina pectoris: Secondary | ICD-10-CM

## 2016-07-12 DIAGNOSIS — R739 Hyperglycemia, unspecified: Secondary | ICD-10-CM | POA: Diagnosis not present

## 2016-07-12 DIAGNOSIS — I10 Essential (primary) hypertension: Secondary | ICD-10-CM

## 2016-07-12 DIAGNOSIS — G4730 Sleep apnea, unspecified: Secondary | ICD-10-CM | POA: Diagnosis not present

## 2016-07-12 DIAGNOSIS — I25119 Atherosclerotic heart disease of native coronary artery with unspecified angina pectoris: Secondary | ICD-10-CM | POA: Insufficient documentation

## 2016-07-12 DIAGNOSIS — J449 Chronic obstructive pulmonary disease, unspecified: Secondary | ICD-10-CM

## 2016-07-12 DIAGNOSIS — Z8673 Personal history of transient ischemic attack (TIA), and cerebral infarction without residual deficits: Secondary | ICD-10-CM

## 2016-07-12 DIAGNOSIS — K2270 Barrett's esophagus without dysplasia: Secondary | ICD-10-CM | POA: Diagnosis not present

## 2016-07-12 DIAGNOSIS — C61 Malignant neoplasm of prostate: Secondary | ICD-10-CM

## 2016-07-12 DIAGNOSIS — N189 Chronic kidney disease, unspecified: Secondary | ICD-10-CM

## 2016-07-12 DIAGNOSIS — Z23 Encounter for immunization: Secondary | ICD-10-CM | POA: Diagnosis not present

## 2016-07-12 NOTE — Telephone Encounter (Signed)
Orders placed for labs

## 2016-07-12 NOTE — Telephone Encounter (Signed)
Pt has lab appt tomorrow. Please place "future" orders

## 2016-07-12 NOTE — Progress Notes (Signed)
Patient ID: Nathaniel Thompson, male   DOB: 09-Mar-1935, 81 y.o.   MRN: [id number]   Subjective:    Patient ID: Nathaniel Thompson, male    DOB: 1936-03-03, 81 y.o.   MRN: [id number]  HPI  Patient here to establish care.  He is accompanied by his wife.  History obtained from both of them.  He has been seeing Dr Richarda Overlie at [facility] and followed at [facility]. Has a history of TIA.  No residual problems.  Has known sleep apnea.  Uses CPAP regularly.  Diagnosed with prostate cancer.  Completed XRT 03/2016.  Followed by urology.  Sees Dr Gustavo Lah for history of Barrett's and GERD.  Has occasional heartburn.  On protonix.  Has f/u 01/2017.  Carries a diagnosis of COPD.  Uses combivent prn.  Breathing stable. Has CKD and history of kidney stones.  Has not had issues with this for a good while.  Has known CAD.  Had two stents placed 03/2001.  States developed a clot in his stent in 2004.  Followed by Dr Chancy Milroy q 6 months.  Doing well.  Last stress test 2 years ago.  No chest pain.  No sob.  States had recent echo and carotid ultrasound - ok.  Unable to take lipitor.  Caused aching.  States has borderline diabetes.  Discussed diet and exercise.     Past Medical History:  Diagnosis Date  . Allergic rhinitis   . Barrett's esophagus 10/21/13  . Chronic kidney disease    stones  . COPD (chronic obstructive pulmonary disease) (HCC)    mild COPD. former smoker  . Coronary artery disease   . Diabetes mellitus without complication ([place])   . Diverticulosis   . Gastritis   . GERD (gastroesophageal reflux disease)   . Hypercholesteremia   . Hypertension   . Prostate cancer ([place])   . Skin cancer    left cheek/lesion excised  . Sleep apnea   . TIA (transient ischemic attack)   . TIA (transient ischemic attack)    Past Surgical History:  Procedure Laterality Date  . CARDIAC CATHETERIZATION    . COLONOSCOPY WITH PROPOFOL N/A 09/13/2015   Procedure: COLONOSCOPY WITH PROPOFOL;  Surgeon: Lollie Sails, MD;  Location[facility] ENDOSCOPY;  Service: Endoscopy;  Laterality: N/A;  . CORONARY ANGIOPLASTY    . ESOPHAGOGASTRODUODENOSCOPY (EGD) WITH PROPOFOL N/A 09/13/2015   Procedure: ESOPHAGOGASTRODUODENOSCOPY (EGD) WITH PROPOFOL;  Surgeon: Lollie Sails, MD;  Location: [facility] ENDOSCOPY;  Service: Endoscopy;  Laterality: N/A;  . ESOPHAGOGASTRODUODENOSCOPY (EGD) WITH PROPOFOL N/A 12/28/2015   Procedure: ESOPHAGOGASTRODUODENOSCOPY (EGD) WITH PROPOFOL;  Surgeon: Lollie Sails, MD;  Location: [facility] ENDOSCOPY;  Service: Endoscopy;  Laterality: N/A;  . ESOPHAGOGASTRODUODENOSCOPY (EGD) WITH PROPOFOL N/A 06/16/2016   Procedure: ESOPHAGOGASTRODUODENOSCOPY (EGD) WITH PROPOFOL;  Surgeon: Lollie Sails, MD;  Location: [facility] ENDOSCOPY;  Service: Endoscopy;  Laterality: N/A;  . EYE SURGERY  2013   CATARACT EXTRACTION  . heart cath stent    . PROSTATE BIOPSY    . stents     multiple   Family History  Problem Relation Age of Onset  . Cancer Mother        gastric and lung  . Cancer Father        multiple myeloma  . Stroke Father   . Cancer Sister        leukemia  . Cancer Brother        leukemia  . Cancer Brother        kidney  .  Cancer Daughter        Uterine  . Cancer Other        [place] (Sister's Son): Prostate   Social History   Social History  . Marital status: Married    Spouse name: N/A  . Number of children: N/A  . Years of education: N/A   Social History Main Topics  . Smoking status: Former Smoker    Packs/day: 1.00    Years: 35.00    Types: Cigarettes    Quit date: 03/06/1986  . Smokeless tobacco: Former Systems developer    Types: Chew  . Alcohol use No  . Drug use: No  . Sexual activity: Not Currently   Other Topics Concern  . None   Social History Narrative  . None    Outpatient Encounter Prescriptions as of 07/12/2016  Medication Sig  . aspirin 81 MG tablet Take 81 mg by mouth daily.  . clopidogrel (PLAVIX) 75 MG tablet Take 75 mg by mouth daily.  . fluticasone (FLONASE) 50 MCG/ACT nasal  spray Place 2 sprays into both nostrils daily.  . Ipratropium-Albuterol (COMBIVENT) 20-100 MCG/ACT AERS respimat Inhale into the lungs.  . isosorbide mononitrate (IMDUR) 30 MG 24 hr tablet Take 30 mg by mouth daily.  [name] losartan (COZAAR) 50 MG tablet Take 50 mg by mouth daily.  [name] lovastatin (MEVACOR) 40 MG tablet Take 80 mg by mouth at bedtime.  . Multiple Vitamin (MULTI-VITAMINS) TABS Take by mouth.  . nitroGLYCERIN (NITROSTAT) 0.4 MG SL tablet Place 0.4 mg under the tongue every 5 (five) minutes as needed for chest pain.  . pantoprazole (PROTONIX) 40 MG tablet Take 40 mg by mouth 2 (two) times daily.   . ranitidine (ZANTAC) 150 MG tablet   . [DISCONTINUED] amLODipine (NORVASC) 5 MG tablet Take 5 mg by mouth daily.  . [DISCONTINUED] cephALEXin (KEFLEX) 500 MG capsule    No facility-administered encounter medications on file as of 07/12/2016.     Review of Systems  Constitutional: Negative for appetite change and unexpected weight change.  HENT: Negative for congestion and sinus pressure.   Eyes: Negative for pain and discharge.  Respiratory: Negative for cough, chest tightness and shortness of breath.   Cardiovascular: Negative for chest pain, palpitations and leg swelling.  Gastrointestinal: Negative for abdominal pain, diarrhea, nausea and vomiting.  Genitourinary: Negative for difficulty urinating and dysuria.  Musculoskeletal: Negative for back pain and joint swelling.  Skin: Negative for color change and rash.  Neurological: Negative for dizziness, light-headedness and headaches.  Psychiatric/Behavioral: Negative for agitation and dysphoric mood.       Objective:    Physical Exam  Constitutional: He appears well-developed and well-nourished. No distress.  HENT:  Nose: Nose normal.  Mouth/Throat: Oropharynx is clear and moist.  Eyes: Conjunctivae are normal. Right eye exhibits no discharge. Left eye exhibits no discharge.  Neck: Neck supple. No thyromegaly present.    Cardiovascular: Normal rate and regular rhythm.   Pulmonary/Chest: Effort normal and breath sounds normal. No respiratory distress.  Abdominal: Soft. Bowel sounds are normal. There is no tenderness.  Musculoskeletal: He exhibits no edema or tenderness.  Lymphadenopathy:    He has no cervical adenopathy.  Skin: No rash noted. No erythema.  Psychiatric: He has a normal mood and affect. His behavior is normal.    BP 138/72   Pulse (!) 59   Temp 97.4 F (36.3 C) (Oral)   Ht 5' 8.5" (1.74 m)   Wt 212 lb 6.4 oz (96.3 kg)   SpO2  97%   BMI 31.83 kg/m  Wt Readings from Last 3 Encounters:  07/12/16 212 lb 6.4 oz (96.3 kg)  06/16/16 208 lb (94.3 kg)  04/13/16 209 lb 14.1 oz (95.2 kg)     Lab Results  Component Value Date   WBC 4.2 02/17/2016   HGB 13.2 02/17/2016   HCT 38.9 (L) 02/17/2016   PLT 156 02/17/2016   GLUCOSE 106 (H) 07/13/2016   CHOL 114 07/13/2016   TRIG 94.0 07/13/2016   HDL 32.60 (L) 07/13/2016   LDLCALC 63 07/13/2016   ALT 11 07/13/2016   AST 16 07/13/2016   NA 140 07/13/2016   K 4.4 07/13/2016   CL 107 07/13/2016   CREATININE 1.27 07/13/2016   BUN 17 07/13/2016   CO2 28 07/13/2016   TSH 1.76 07/13/2016       Assessment & Plan:   Problem List Items Addressed This Visit    Barrett's esophagus    Followed by Dr Gustavo Lah. Occasional heartburn.  On protonix.        CAD (coronary artery disease)    Has known CAD s/p stent placement as outlined.  Followed by Dr Chancy Milroy.  Currently doing well.  Continue risk factor modification.        CKD (chronic kidney disease)    Stable.  Follow renal function.        COPD (chronic obstructive pulmonary disease) (HCC)    Uses combivent prn.  Breathing stable.        History of TIA (transient ischemic attack)    On aspirin and plavix.  Doing well.  Continue risk factor modification.        Hypercholesterolemia    On lovastatin.  lipitor caused aching.  Low cholesterol diet and exercise.  Follow lipid panel and  liver function tests.        Hyperglycemia    Low carb diet and exercise.  Follow met b and a1c.        Hypertension, essential    Blood pressure as outlined.  States has been under good control.  Same medication regimen.  Follow pressures.  Follow metabolic panel.        Malignant neoplasm of prostate ([place])    Just completed XRT 03/2016.  Followed by Dr Karsten Ro - with [facility].        Sleep apnea    Uses CPAP regularly.         Other Visit Diagnoses    Need for hepatitis vaccination    -  Primary   Need for vaccination       Relevant Orders   Pneumococcal conjugate vaccine 13-valent (Completed)      I spent 45 minutes with the patient and more than 50% of the time was spent in consultation regarding the above.  Time spent obtaining information about his past history and current follow up with outside physicians.  Also time spent obtaining information regarding any current clinical concerns and plan for f/u with treatment.      Einar Pheasant, MD

## 2016-07-13 ENCOUNTER — Other Ambulatory Visit (INDEPENDENT_AMBULATORY_CARE_PROVIDER_SITE_OTHER): Payer: Medicare HMO

## 2016-07-13 DIAGNOSIS — I10 Essential (primary) hypertension: Secondary | ICD-10-CM | POA: Diagnosis not present

## 2016-07-13 DIAGNOSIS — E7800 Pure hypercholesterolemia, unspecified: Secondary | ICD-10-CM | POA: Diagnosis not present

## 2016-07-13 LAB — HEPATIC FUNCTION PANEL
ALBUMIN: 3.9 g/dL (ref 3.5–5.2)
ALT: 11 U/L (ref 0–53)
AST: 16 U/L (ref 0–37)
Alkaline Phosphatase: 48 U/L (ref 39–117)
BILIRUBIN TOTAL: 0.8 mg/dL (ref 0.2–1.2)
Bilirubin, Direct: 0.2 mg/dL (ref 0.0–0.3)
Total Protein: 6.4 g/dL (ref 6.0–8.3)

## 2016-07-13 LAB — BASIC METABOLIC PANEL
BUN: 17 mg/dL (ref 6–23)
CHLORIDE: 107 meq/L (ref 96–112)
CO2: 28 meq/L (ref 19–32)
CREATININE: 1.27 mg/dL (ref 0.40–1.50)
Calcium: 9.3 mg/dL (ref 8.4–10.5)
GFR: 58.2 mL/min — ABNORMAL LOW (ref >60.00)
Glucose, Bld: 106 mg/dL — ABNORMAL HIGH (ref 70–99)
POTASSIUM: 4.4 meq/L (ref 3.5–5.1)
SODIUM: 140 meq/L (ref 135–145)

## 2016-07-13 LAB — LIPID PANEL
Cholesterol: 114 mg/dL (ref 0–200)
HDL: 32.6 mg/dL — ABNORMAL LOW (ref >39.00)
LDL Cholesterol: 63 mg/dL (ref 0–99)
NONHDL: 81.83
Total CHOL/HDL Ratio: 4
Triglycerides: 94 mg/dL (ref 0.0–149.0)
VLDL: 18.8 mg/dL (ref 0.0–40.0)

## 2016-07-13 LAB — TSH: TSH: 1.76 u[IU]/mL (ref 0.35–4.50)

## 2016-07-17 ENCOUNTER — Telehealth: Payer: Self-pay | Admitting: *Deleted

## 2016-07-17 NOTE — Telephone Encounter (Signed)
Patient requested lab results  Pt contact [phone]

## 2016-07-17 NOTE — Telephone Encounter (Signed)
Patient informed. 

## 2016-07-19 ENCOUNTER — Telehealth: Payer: Self-pay

## 2016-07-19 MED ORDER — AMLODIPINE BESYLATE 5 MG PO TABS
5.0000 mg | ORAL_TABLET | Freq: Every day | ORAL | 1 refills | Status: DC
Start: 2016-07-19 — End: 2016-09-19

## 2016-07-19 NOTE — Telephone Encounter (Signed)
ok'd rx for amlodipine #90 with one refill.

## 2016-07-19 NOTE — Telephone Encounter (Signed)
Fax received for refill on amlodipine 5 mg #90.  Never given script by you established care on 07-12-16.  Has F/u 01/10/17 Labs 07-13-16

## 2016-07-24 ENCOUNTER — Encounter: Payer: Self-pay | Admitting: Internal Medicine

## 2016-07-24 DIAGNOSIS — Z8673 Personal history of transient ischemic attack (TIA), and cerebral infarction without residual deficits: Secondary | ICD-10-CM | POA: Insufficient documentation

## 2016-07-24 DIAGNOSIS — J449 Chronic obstructive pulmonary disease, unspecified: Secondary | ICD-10-CM | POA: Insufficient documentation

## 2016-07-24 DIAGNOSIS — K2270 Barrett's esophagus without dysplasia: Secondary | ICD-10-CM | POA: Insufficient documentation

## 2016-07-24 DIAGNOSIS — R739 Hyperglycemia, unspecified: Secondary | ICD-10-CM | POA: Insufficient documentation

## 2016-07-24 DIAGNOSIS — G4730 Sleep apnea, unspecified: Secondary | ICD-10-CM | POA: Insufficient documentation

## 2016-07-24 DIAGNOSIS — N183 Chronic kidney disease, stage 3 unspecified: Secondary | ICD-10-CM | POA: Insufficient documentation

## 2016-07-24 DIAGNOSIS — N189 Chronic kidney disease, unspecified: Secondary | ICD-10-CM | POA: Insufficient documentation

## 2016-07-24 NOTE — Assessment & Plan Note (Signed)
On lovastatin.  lipitor caused aching.  Low cholesterol diet and exercise.  Follow lipid panel and liver function tests.   

## 2016-07-24 NOTE — Assessment & Plan Note (Signed)
Blood pressure as outlined.  States has been under good control.  Same medication regimen.  Follow pressures.  Follow metabolic panel.

## 2016-07-24 NOTE — Assessment & Plan Note (Signed)
Followed by Dr Gustavo Lah. Occasional heartburn.  On protonix.

## 2016-07-24 NOTE — Assessment & Plan Note (Signed)
On aspirin and plavix.  Doing well.  Continue risk factor modification.   

## 2016-07-24 NOTE — Assessment & Plan Note (Signed)
Uses CPAP regularly °

## 2016-07-24 NOTE — Assessment & Plan Note (Signed)
Just completed XRT 03/2016.  Followed by Dr Karsten Ro - with [facility].

## 2016-07-24 NOTE — Assessment & Plan Note (Signed)
Uses combivent prn.  Breathing stable.

## 2016-07-24 NOTE — Assessment & Plan Note (Signed)
Stable.  Follow renal function.

## 2016-07-24 NOTE — Assessment & Plan Note (Signed)
Has known CAD s/p stent placement as outlined.  Followed by Dr Chancy Milroy.  Currently doing well.  Continue risk factor modification.

## 2016-07-24 NOTE — Assessment & Plan Note (Signed)
Low carb diet and exercise.  Follow met b and a1c.   

## 2016-07-25 DIAGNOSIS — C44319 Basal cell carcinoma of skin of other parts of face: Secondary | ICD-10-CM | POA: Diagnosis not present

## 2016-07-25 DIAGNOSIS — D485 Neoplasm of uncertain behavior of skin: Secondary | ICD-10-CM | POA: Diagnosis not present

## 2016-07-25 DIAGNOSIS — Z872 Personal history of diseases of the skin and subcutaneous tissue: Secondary | ICD-10-CM | POA: Diagnosis not present

## 2016-07-25 DIAGNOSIS — L570 Actinic keratosis: Secondary | ICD-10-CM | POA: Diagnosis not present

## 2016-07-25 DIAGNOSIS — L821 Other seborrheic keratosis: Secondary | ICD-10-CM | POA: Diagnosis not present

## 2016-07-25 DIAGNOSIS — Z85828 Personal history of other malignant neoplasm of skin: Secondary | ICD-10-CM | POA: Diagnosis not present

## 2016-07-26 ENCOUNTER — Ambulatory Visit: Payer: Medicare HMO | Admitting: Internal Medicine

## 2016-08-03 ENCOUNTER — Inpatient Hospital Stay: Payer: Medicare HMO | Attending: Radiation Oncology

## 2016-08-03 DIAGNOSIS — Z923 Personal history of irradiation: Secondary | ICD-10-CM | POA: Insufficient documentation

## 2016-08-03 DIAGNOSIS — C61 Malignant neoplasm of prostate: Secondary | ICD-10-CM | POA: Diagnosis not present

## 2016-08-03 LAB — PSA: PSA: 5.47 ng/mL — AB (ref 0.00–4.00)

## 2016-08-04 ENCOUNTER — Telehealth: Payer: Self-pay | Admitting: Internal Medicine

## 2016-08-04 MED ORDER — CLOPIDOGREL BISULFATE 75 MG PO TABS: 75.0000 mg | ORAL_TABLET | Freq: Every day | ORAL | 0 refills | Status: DC

## 2016-08-04 MED ORDER — ISOSORBIDE MONONITRATE ER 30 MG PO TB24: 30.0000 mg | ORAL_TABLET | Freq: Every day | ORAL | 0 refills | Status: DC

## 2016-08-04 NOTE — Telephone Encounter (Signed)
Looks like patent established care is this medication that you will start to write for him?

## 2016-08-04 NOTE — Telephone Encounter (Signed)
Pt called requesting refills on his isosorbide mononitrate (IMDUR) 30 MG 24 hr tablet, and clopidogrel (PLAVIX) 75 MG tablet, . He needs a 90 day supply. Please advise, thank you!  [place], [place].  Call pt @ [phone]

## 2016-08-04 NOTE — Telephone Encounter (Signed)
I do not mind refilling the medications, but he sees cardiology regularly.  Have they been refilling the medication?  If so, then have them continue.  If not, ok to refill x 3

## 2016-08-04 NOTE — Telephone Encounter (Signed)
I have called patient states old pcp would give 90 day supply. I have called in both with #90 no refills.

## 2016-08-06 ENCOUNTER — Encounter: Payer: Self-pay | Admitting: Internal Medicine

## 2016-08-06 DIAGNOSIS — I779 Disorder of arteries and arterioles, unspecified: Secondary | ICD-10-CM | POA: Insufficient documentation

## 2016-08-06 DIAGNOSIS — I739 Peripheral vascular disease, unspecified: Secondary | ICD-10-CM

## 2016-08-07 ENCOUNTER — Ambulatory Visit
Admission: RE | Admit: 2016-08-07 | Discharge: 2016-08-07 | Disposition: A | Discharge status: Home / Self Care | Payer: Medicare HMO | Source: Ambulatory Visit | Attending: Radiation Oncology | Admitting: Radiation Oncology

## 2016-08-07 ENCOUNTER — Encounter: Payer: Self-pay | Admitting: Radiation Oncology

## 2016-08-07 VITALS — BP 142/74 | HR 58 | Temp 94.9°F | Resp 18 | Wt 206.6 lb

## 2016-08-07 DIAGNOSIS — Z923 Personal history of irradiation: Secondary | ICD-10-CM | POA: Insufficient documentation

## 2016-08-07 DIAGNOSIS — C61 Malignant neoplasm of prostate: Secondary | ICD-10-CM

## 2016-08-07 DIAGNOSIS — Z87891 Personal history of nicotine dependence: Secondary | ICD-10-CM | POA: Diagnosis not present

## 2016-08-07 NOTE — Progress Notes (Signed)
Radiation Oncology Follow up Note  Name: Nathaniel Thompson   Date:   08/07/2016 MRN:  [id number] DOB: [birth year]    This 80 y.o. male presents to the clinic today for 5 month follow-up status post I MRT radiation therapy for stage IIa adenocarcinoma the prostate.  REFERRING PROVIDER: Dion Body, MD  HPI: Patient is a 81 year old male now out 5 months having completed IM RT radiation therapy to his prostate and pelvic nodes for Gleason 7 (4+3) adenocarcinoma the prostate presenting the PSA of 7.8. He is seen today in routine follow-up is doing well specifically denies diarrhea dysuria or any other GI/GU complaints.. His most recent PSA was 5.47 performed at the end of May.  COMPLICATIONS OF TREATMENT: none  FOLLOW UP COMPLIANCE: keeps appointments   PHYSICAL EXAM:  BP (!) 142/74   Pulse (!) 58   Temp (!) 94.9 F (34.9 C)   Resp 18   Wt 206 lb 9.1 oz (93.7 kg)   BMI 30.95 kg/m  On rectal exam rectal sphincter tone is good. Prostate is smooth contracted without evidence of nodularity or mass. Sulcus is preserved bilaterally. No discrete nodularity is identified. No other rectal abnormalities are noted. Well-developed well-nourished patient in NAD. HEENT reveals PERLA, EOMI, discs not visualized.  Oral cavity is clear. No oral mucosal lesions are identified. Neck is clear without evidence of cervical or supraclavicular adenopathy. Lungs are clear to A&P. Cardiac examination is essentially unremarkable with regular rate and rhythm without murmur rub or thrill. Abdomen is benign with no organomegaly or masses noted. Motor sensory and DTR levels are equal and symmetric in the upper and lower extremities. Cranial nerves II through XII are grossly intact. Proprioception is intact. No peripheral adenopathy or edema is identified. No motor or sensory levels are noted. Crude visual fields are within normal range.  RADIOLOGY RESULTS: No current films for review  PLAN: Present time I am not that  pleased with his PSA response. It has decreased although as above 5. I will repeat his PSA in 6 months and then may start pulse Lupron therapy. I believe I did not start him on Lupron therapy initially since he is has had cardiac catheterization in the past and was expecting to see an overall improvement in his PSA level. Patient will return in 6 months and have a TSH test prior to that. At that time may make an option to start Lupron therapy.  I would like to take this opportunity to thank you for allowing me to participate in the care of your patient.Armstead Peaks., MD

## 2016-08-10 ENCOUNTER — Ambulatory Visit: Payer: Medicare HMO | Admitting: Radiation Oncology

## 2016-08-16 DIAGNOSIS — C44319 Basal cell carcinoma of skin of other parts of face: Secondary | ICD-10-CM | POA: Diagnosis not present

## 2016-09-07 DIAGNOSIS — M19012 Primary osteoarthritis, left shoulder: Secondary | ICD-10-CM | POA: Diagnosis not present

## 2016-09-07 DIAGNOSIS — M542 Cervicalgia: Secondary | ICD-10-CM | POA: Diagnosis not present

## 2016-09-07 DIAGNOSIS — M25512 Pain in left shoulder: Secondary | ICD-10-CM | POA: Diagnosis not present

## 2016-09-12 DIAGNOSIS — C61 Malignant neoplasm of prostate: Secondary | ICD-10-CM | POA: Diagnosis not present

## 2016-09-19 ENCOUNTER — Telehealth: Payer: Self-pay | Admitting: Internal Medicine

## 2016-09-19 MED ORDER — AMLODIPINE BESYLATE 5 MG PO TABS: 5.0000 mg | ORAL_TABLET | Freq: Every day | ORAL | 1 refills | Status: DC

## 2016-09-19 MED ORDER — LOVASTATIN 40 MG PO TABS: 80.0000 mg | ORAL_TABLET | Freq: Every day | ORAL | 1 refills | Status: DC

## 2016-09-19 NOTE — Telephone Encounter (Signed)
Medications sent to [facility].

## 2016-09-19 NOTE — Telephone Encounter (Signed)
Refill called into [facility]    lovastatin (MEVACOR) 40 MG tablet /90 day supply  amLODipine (NORVASC) 5 MG tablet/90 day supply

## 2016-09-20 DIAGNOSIS — Z8546 Personal history of malignant neoplasm of prostate: Secondary | ICD-10-CM | POA: Diagnosis not present

## 2016-10-12 DIAGNOSIS — Z9861 Coronary angioplasty status: Secondary | ICD-10-CM | POA: Diagnosis not present

## 2016-10-12 DIAGNOSIS — I2510 Atherosclerotic heart disease of native coronary artery without angina pectoris: Secondary | ICD-10-CM | POA: Diagnosis not present

## 2016-10-12 DIAGNOSIS — G4730 Sleep apnea, unspecified: Secondary | ICD-10-CM | POA: Diagnosis not present

## 2016-10-12 DIAGNOSIS — E785 Hyperlipidemia, unspecified: Secondary | ICD-10-CM | POA: Diagnosis not present

## 2016-10-12 DIAGNOSIS — K210 Gastro-esophageal reflux disease with esophagitis: Secondary | ICD-10-CM | POA: Diagnosis not present

## 2016-10-12 DIAGNOSIS — I10 Essential (primary) hypertension: Secondary | ICD-10-CM | POA: Diagnosis not present

## 2016-10-30 ENCOUNTER — Other Ambulatory Visit: Payer: Self-pay | Admitting: Internal Medicine

## 2016-11-20 DIAGNOSIS — H903 Sensorineural hearing loss, bilateral: Secondary | ICD-10-CM | POA: Diagnosis not present

## 2016-11-21 ENCOUNTER — Other Ambulatory Visit
Admission: RE | Admit: 2016-11-21 | Discharge: 2016-11-21 | Disposition: A | Discharge status: Home / Self Care | Payer: Medicare HMO | Source: Ambulatory Visit | Attending: Podiatry | Admitting: Podiatry

## 2016-11-21 DIAGNOSIS — M6740 Ganglion, unspecified site: Secondary | ICD-10-CM | POA: Diagnosis not present

## 2016-11-21 DIAGNOSIS — M19071 Primary osteoarthritis, right ankle and foot: Secondary | ICD-10-CM | POA: Diagnosis not present

## 2016-11-21 DIAGNOSIS — M67471 Ganglion, right ankle and foot: Secondary | ICD-10-CM | POA: Diagnosis not present

## 2016-11-21 DIAGNOSIS — M25571 Pain in right ankle and joints of right foot: Secondary | ICD-10-CM | POA: Diagnosis not present

## 2016-11-21 LAB — SYNOVIAL CELL COUNT + DIFF, W/ CRYSTALS
Crystals, Fluid: NONE SEEN
Eosinophils-Synovial: 0 %
Lymphocytes-Synovial Fld: 57 %
Monocyte-Macrophage-Synovial Fluid: 40 %
Neutrophil, Synovial: 3 %
Other Cells-SYN: 0
WBC, Synovial: 196 /mm3 (ref 0–200)

## 2016-11-22 ENCOUNTER — Telehealth: Payer: Self-pay | Admitting: Radiation Oncology

## 2016-11-22 ENCOUNTER — Telehealth: Payer: Self-pay | Admitting: Internal Medicine

## 2016-11-22 NOTE — Telephone Encounter (Signed)
Per patient, DO NOT SCHEDULE ANY FURTHER APPTS here at [facility] or Rad/Onc.  Patient stated that his Urologist told him that he didn't need to return to [facility] for dollow up care and that his Urologist will follow him up with care. Lab appt for 01/29/17 cancelled per patient request. Patient transferred to Rad/Onc to cancel Rad/Onc appt. 11/22/16 MF

## 2016-11-22 NOTE — Telephone Encounter (Signed)
What his wife told me was that his insurance stated he did not need a new sleep study.  Need to clarify if just needs rx for machine.  If so, then I need a copy of his last sleep study from Dr Chancy Milroy.  If needs new sleep study, can order.

## 2016-11-22 NOTE — Telephone Encounter (Signed)
Patient's sleep study was done in 2012 . Patient needing referral for sleep study to obtain new CPAP machine,.

## 2016-11-22 NOTE — Telephone Encounter (Signed)
Patient has not had sleep study done in over 5 yrs. In order to get new cpap he will need updated sleep study.

## 2016-11-22 NOTE — Telephone Encounter (Signed)
Left message to return call to our office.  

## 2016-11-22 NOTE — Telephone Encounter (Signed)
Called patient states that was told he only needs sleep study every 66yrs. He will bring copy of report for you to review to the office tomorrow.

## 2016-11-23 ENCOUNTER — Telehealth: Payer: Self-pay | Admitting: Internal Medicine

## 2016-11-23 NOTE — Telephone Encounter (Signed)
Pt came in to drop off his sleep study results. It's in Yellow folder up front. Thank you!

## 2016-11-23 NOTE — Telephone Encounter (Signed)
Put in yellow folder.

## 2016-11-24 NOTE — Telephone Encounter (Signed)
Reviewed sleep study results.  The sleep study I reviewed recommended CPAP titration.   Need CPAP titration results as well.  Need cpap titration to be able to write the prescription for new CPAP machine.  Need proof of settings currently on.  Was performed through Dr Marella Bile office (Dr Early Chars).  Will probably need to call his office for information.

## 2016-11-24 NOTE — Telephone Encounter (Signed)
New message started

## 2016-11-26 LAB — BODY FLUID CULTURE: Culture: NO GROWTH

## 2016-11-28 ENCOUNTER — Ambulatory Visit (INDEPENDENT_AMBULATORY_CARE_PROVIDER_SITE_OTHER): Payer: Medicare HMO

## 2016-11-28 DIAGNOSIS — Z23 Encounter for immunization: Secondary | ICD-10-CM | POA: Diagnosis not present

## 2016-11-28 NOTE — Progress Notes (Signed)
Patient received flu shot, tolerated well.  

## 2016-11-28 NOTE — Telephone Encounter (Signed)
Called l/m at office to get call back so I can get notes.

## 2016-11-29 NOTE — Telephone Encounter (Signed)
Called office told to fax cover sheet with information on what was needed to office at [phone]. I have done and holding with other notes in my yellow folder.

## 2016-12-11 NOTE — Telephone Encounter (Signed)
Pt lvm on referral line requesting update for his sleep study.pt cb [phone]

## 2016-12-12 DIAGNOSIS — M6740 Ganglion, unspecified site: Secondary | ICD-10-CM | POA: Diagnosis not present

## 2016-12-12 DIAGNOSIS — M19071 Primary osteoarthritis, right ankle and foot: Secondary | ICD-10-CM | POA: Diagnosis not present

## 2016-12-12 NOTE — Telephone Encounter (Signed)
Patient called back states he had done at [facility]. We will need to call and see if we can get results from them.

## 2016-12-13 NOTE — Telephone Encounter (Signed)
Results in blue folder for review.

## 2016-12-14 NOTE — Telephone Encounter (Signed)
rx written for CPAP - 13 cm H20.  rx placed in box along with copy of last sleep study.  Per previous message, needed rx for new machine.

## 2016-12-14 NOTE — Telephone Encounter (Signed)
Spoke to patient would like orders and script [facility] to [facility].

## 2016-12-18 NOTE — Telephone Encounter (Signed)
[facility] faxed to [facility]

## 2016-12-21 ENCOUNTER — Other Ambulatory Visit: Payer: Self-pay | Admitting: Internal Medicine

## 2016-12-24 ENCOUNTER — Emergency Department
Admission: EM | Admit: 2016-12-24 | Discharge: 2016-12-24 | Disposition: A | Discharge status: Home / Self Care | Payer: Medicare HMO | Attending: Emergency Medicine | Admitting: Emergency Medicine

## 2016-12-24 ENCOUNTER — Encounter: Payer: Self-pay | Admitting: *Deleted

## 2016-12-24 DIAGNOSIS — I259 Chronic ischemic heart disease, unspecified: Secondary | ICD-10-CM | POA: Diagnosis not present

## 2016-12-24 DIAGNOSIS — Z85828 Personal history of other malignant neoplasm of skin: Secondary | ICD-10-CM | POA: Insufficient documentation

## 2016-12-24 DIAGNOSIS — K649 Unspecified hemorrhoids: Secondary | ICD-10-CM | POA: Diagnosis not present

## 2016-12-24 DIAGNOSIS — N189 Chronic kidney disease, unspecified: Secondary | ICD-10-CM | POA: Insufficient documentation

## 2016-12-24 DIAGNOSIS — Z7982 Long term (current) use of aspirin: Secondary | ICD-10-CM | POA: Diagnosis not present

## 2016-12-24 DIAGNOSIS — Z9861 Coronary angioplasty status: Secondary | ICD-10-CM | POA: Insufficient documentation

## 2016-12-24 DIAGNOSIS — Z87891 Personal history of nicotine dependence: Secondary | ICD-10-CM | POA: Insufficient documentation

## 2016-12-24 DIAGNOSIS — K644 Residual hemorrhoidal skin tags: Secondary | ICD-10-CM

## 2016-12-24 DIAGNOSIS — Z79899 Other long term (current) drug therapy: Secondary | ICD-10-CM | POA: Insufficient documentation

## 2016-12-24 DIAGNOSIS — E119 Type 2 diabetes mellitus without complications: Secondary | ICD-10-CM | POA: Insufficient documentation

## 2016-12-24 DIAGNOSIS — K645 Perianal venous thrombosis: Secondary | ICD-10-CM | POA: Insufficient documentation

## 2016-12-24 DIAGNOSIS — K625 Hemorrhage of anus and rectum: Secondary | ICD-10-CM | POA: Diagnosis present

## 2016-12-24 DIAGNOSIS — Z7902 Long term (current) use of antithrombotics/antiplatelets: Secondary | ICD-10-CM | POA: Insufficient documentation

## 2016-12-24 DIAGNOSIS — I129 Hypertensive chronic kidney disease with stage 1 through stage 4 chronic kidney disease, or unspecified chronic kidney disease: Secondary | ICD-10-CM | POA: Diagnosis not present

## 2016-12-24 LAB — COMPREHENSIVE METABOLIC PANEL
ALT: 20 U/L (ref 17–63)
AST: 34 U/L (ref 15–41)
Albumin: 4 g/dL (ref 3.5–5.0)
Alkaline Phosphatase: 48 U/L (ref 38–126)
Anion gap: 9 (ref 5–15)
BUN: 12 mg/dL (ref 6–20)
CHLORIDE: 107 mmol/L (ref 101–111)
CO2: 23 mmol/L (ref 22–32)
CREATININE: 1.23 mg/dL (ref 0.61–1.24)
Calcium: 9.2 mg/dL (ref 8.9–10.3)
GFR calc Af Amer: >60 mL/min (ref >60)
GFR calc non Af Amer: 54 mL/min — ABNORMAL LOW (ref >60)
GLUCOSE: 130 mg/dL — AB (ref 65–99)
POTASSIUM: 3.9 mmol/L (ref 3.5–5.1)
Sodium: 139 mmol/L (ref 135–145)
Total Bilirubin: 0.8 mg/dL (ref 0.3–1.2)
Total Protein: 7.1 g/dL (ref 6.5–8.1)

## 2016-12-24 LAB — CBC
HEMATOCRIT: 42.9 % (ref 40.0–52.0)
Hemoglobin: 14.4 g/dL (ref 13.0–18.0)
MCH: 30.2 pg (ref 26.0–34.0)
MCHC: 33.6 g/dL (ref 32.0–36.0)
MCV: 89.9 fL (ref 80.0–100.0)
Platelets: 193 10*3/uL (ref 150–440)
RBC: 4.78 MIL/uL (ref 4.40–5.90)
RDW: 13.7 % (ref 11.5–14.5)
WBC: 6.2 10*3/uL (ref 3.8–10.6)

## 2016-12-24 MED ORDER — LIDOCAINE HCL 2 % EX GEL: 1.0000 "application " | Freq: Once | CUTANEOUS | Status: DC

## 2016-12-24 MED ORDER — LIDOCAINE HCL 2 % EX GEL
CUTANEOUS | Status: AC
  Filled 2016-12-24: qty 10

## 2016-12-24 MED ORDER — LIDOCAINE 5 % EX OINT: 1.0000 "application " | TOPICAL_OINTMENT | Freq: Three times a day (TID) | CUTANEOUS | 0 refills | Status: DC | PRN

## 2016-12-24 MED ORDER — LIDOCAINE HCL 2 % EX GEL
1.0000 "application " | Freq: Once | CUTANEOUS | Status: AC
  Administered 2016-12-24: 1 via TOPICAL

## 2016-12-24 NOTE — ED Provider Notes (Signed)
[facility] Emergency Department Provider Note   ____________________________________________   First MD Initiated Contact with Patient 12/24/16 1746     (approximate)  I have reviewed the triage vital signs and the nursing notes.   HISTORY  Chief Complaint Hemorrhoids    HPI Nathaniel Thompson is a 81 y.o. male U for evaluation of bleeding hemorrhoids  Patient reports since Thursday he has been experiencing small amount of drainage and seeing small amounts of blood in his underpants and having discomfort around his rectum where he has had hemorrhoids.  He reports they continue to be tender, is worse when he walks, also discomfort when he sits.  He seen small amounts of bleeding, reports it was more drops of blood in the bleeding was worse day or 2 ago.  He comes for evaluation hoping that he has some type of treatment to relieve his discomfort.  No fevers or chills.  Takes aspirin and Plavix  Past Medical History:  Diagnosis Date  . Allergic rhinitis   . Barrett's esophagus 10/21/13  . Chronic kidney disease    stones  . COPD (chronic obstructive pulmonary disease) (HCC)    mild COPD. former smoker  . Coronary artery disease   . Diabetes mellitus without complication ([place])   . Diverticulosis   . Gastritis   . GERD (gastroesophageal reflux disease)   . Hypercholesteremia   . Hypertension   . Prostate cancer ([place])   . Skin cancer    left cheek/lesion excised  . Sleep apnea   . TIA (transient ischemic attack)   . TIA (transient ischemic attack)     Patient Active Problem List   Diagnosis Date Noted  . Carotid artery disease ([place]) 08/06/2016  . History of TIA (transient ischemic attack) 07/24/2016  . Sleep apnea 07/24/2016  . COPD (chronic obstructive pulmonary disease) ([place]) 07/24/2016  . CKD (chronic kidney disease) 07/24/2016  . Barrett's esophagus 07/24/2016  . Hyperglycemia 07/24/2016  . Hypercholesterolemia 07/12/2016  . Hypertension,  essential 07/12/2016  . CAD (coronary artery disease) 07/12/2016  . Malignant neoplasm of prostate ([place]) 11/15/2015  . Family history of cancer 11/15/2015    Past Surgical History:  Procedure Laterality Date  . CARDIAC CATHETERIZATION    . COLONOSCOPY WITH PROPOFOL N/A 09/13/2015   Procedure: COLONOSCOPY WITH PROPOFOL;  Surgeon: Lollie Sails, MD;  Location: [facility] ENDOSCOPY;  Service: Endoscopy;  Laterality: N/A;  . CORONARY ANGIOPLASTY    . ESOPHAGOGASTRODUODENOSCOPY (EGD) WITH PROPOFOL N/A 09/13/2015   Procedure: ESOPHAGOGASTRODUODENOSCOPY (EGD) WITH PROPOFOL;  Surgeon: Lollie Sails, MD;  Location: [facility] ENDOSCOPY;  Service: Endoscopy;  Laterality: N/A;  . ESOPHAGOGASTRODUODENOSCOPY (EGD) WITH PROPOFOL N/A 12/28/2015   Procedure: ESOPHAGOGASTRODUODENOSCOPY (EGD) WITH PROPOFOL;  Surgeon: Lollie Sails, MD;  Location: [facility] ENDOSCOPY;  Service: Endoscopy;  Laterality: N/A;  . ESOPHAGOGASTRODUODENOSCOPY (EGD) WITH PROPOFOL N/A 06/16/2016   Procedure: ESOPHAGOGASTRODUODENOSCOPY (EGD) WITH PROPOFOL;  Surgeon: Lollie Sails, MD;  Location: [facility] ENDOSCOPY;  Service: Endoscopy;  Laterality: N/A;  . EYE SURGERY  2013   CATARACT EXTRACTION  . heart cath stent    . PROSTATE BIOPSY    . stents     multiple    Prior to Admission medications   Medication Sig Start Date End Date Taking? Authorizing Provider  amLODipine (NORVASC) 5 MG tablet Take 1 tablet (5 mg total) by mouth daily. 09/19/16   Einar Pheasant, MD  aspirin 81 MG tablet Take 81 mg by mouth daily.    [provider]  clopidogrel (PLAVIX) 75 MG tablet Take 1 tablet (75 mg total) by mouth daily. 08/04/16   Einar Pheasant, MD  fluticasone (FLONASE) 50 MCG/ACT nasal spray Place 2 sprays into both nostrils daily.    [provider]  Ipratropium-Albuterol (COMBIVENT) 20-100 MCG/ACT AERS respimat Inhale into the lungs. 06/22/15   [provider]  isosorbide mononitrate (IMDUR) 30 MG 24 hr tablet TAKE 1  TABLET BY MOUTH ONCE DAILY 10/30/16   Einar Pheasant, MD  lidocaine (XYLOCAINE) 5 % ointment Apply 1 application topically 3 (three) times daily as needed. Apply a small amount to hemorrhoids up to 3 times daily for discomfort 12/24/16   Delman Kitten, MD  losartan (COZAAR) 50 MG tablet Take 50 mg by mouth daily.    [provider]  lovastatin (MEVACOR) 40 MG tablet TAKE 2 TABLETS BY MOUTH AT BEDTIME 12/21/16   Einar Pheasant, MD  Multiple Vitamin (MULTI-VITAMINS) TABS Take by mouth.    [provider]  nitroGLYCERIN (NITROSTAT) 0.4 MG SL tablet Place 0.4 mg under the tongue every 5 (five) minutes as needed for chest pain.    [provider]  pantoprazole (PROTONIX) 40 MG tablet Take 40 mg by mouth 2 (two) times daily.     [provider]  ranitidine (ZANTAC) 150 MG tablet  04/05/16   [provider]    Allergies Atorvastatin  Family History  Problem Relation Age of Onset  . Cancer Mother        gastric and lung  . Cancer Father        multiple myeloma  . Stroke Father   . Cancer Sister        leukemia  . Cancer Brother        leukemia  . Cancer Brother        kidney  . Cancer Daughter        Uterine  . Cancer Other        [place] (Sister's Son): Prostate    Social History Social History  Substance Use Topics  . Smoking status: Former Smoker    Packs/day: 1.00    Years: 35.00    Types: Cigarettes    Quit date: 03/06/1986  . Smokeless tobacco: Former Systems developer    Types: Chew  . Alcohol use No    Review of Systems Constitutional: No fever/chills Cardiovascular: Denies chest pain. Respiratory: Denies shortness of breath. Gastrointestinal: No abdominal pain.  No nausea, no vomiting.  No diarrhea.  Occasionally constipated.  See HPI.  Denies any rash or pain along the buttock except right around the rectum where he has "hemorrhoids".  He has not had any black or bloody stools but does note some blood around the bottom after  wiping. Genitourinary: Negative for dysuria. Musculoskeletal: Negative for back pain. Skin: Negative for rash. Neurological: Negative for headaches.    ____________________________________________   PHYSICAL EXAM:  VITAL SIGNS: ED Triage Vitals  Enc Vitals Group     BP 12/24/16 1450 138/78     Pulse Rate 12/24/16 1450 86     Resp 12/24/16 1450 18     Temp 12/24/16 1450 (!) 97.5 F (36.4 C)     Temp Source 12/24/16 1450 Oral     SpO2 12/24/16 1450 98 %     Weight 12/24/16 1451 207 lb (93.9 kg)     Height 12/24/16 1451 '5\' 10"'  (1.778 m)     Head Circumference --      Peak Flow --      Pain  Score 12/24/16 1449 2     Pain Loc --      Pain Edu? --      Excl. in [place]? --     Constitutional: Alert and oriented. Well appearing and in no acute distress. Eyes: Conjunctivae are normal. Head: Atraumatic. Nose: No congestion/rhinnorhea. Mouth/Throat: Mucous membranes are moist. Neck: No stridor.   Cardiovascular: Normal rate, regular rhythm. Respiratory: Normal respiratory effort.  Gastrointestinal: Soft and nontender. No distention. Rectal exam: External exam demonstrates left side with a notable relatively nontender hemorrhoid about 2 cm in size, the right side of the anus demonstrates a thrombosed external hemorrhoid that is tender, has a small amount of adherent clot upon it and no active bleeding at present.  It is quite tender to touch and about 1/2 cm in size.  There is no purulent drainage or surrounding erythema or evidence of induration or abscess about the area.  The perineum is normal. Musculoskeletal: No lower extremity tenderness nor edema. Neurologic:  Normal speech and language. No gross focal neurologic deficits are appreciated.  Skin:  Skin is warm, dry and intact. No rash noted. Psychiatric: Mood and affect are normal. Speech and behavior are normal.  The patient is wife both very pleasant.  ____________________________________________   LABS (all labs ordered  are listed, but only abnormal results are displayed)  Labs Reviewed  COMPREHENSIVE METABOLIC PANEL - Abnormal; Notable for the following:       Result Value   Glucose, Bld 130 (*)    GFR calc non Af Amer 54 (*)    All other components within normal limits  CBC  POC OCCULT BLOOD, ED   ____________________________________________  EKG   ____________________________________________  RADIOLOGY   ____________________________________________   PROCEDURES  Procedure(s) performed: None  Procedures  Critical Care performed: No  ____________________________________________   INITIAL IMPRESSION / ASSESSMENT AND PLAN / ED COURSE  Pertinent labs & imaging results that were available during my care of the patient were reviewed by me and considered in my medical decision making (see chart for details).  Hemoglobin normal.  He is on aspirin and Plavix, apparent hemorrhoid, likely the one on the right causing his problems as it has some clot on it is no longer bleeding.  Discussed with Dr. Adora Fridge of general surgery, as the patient is on aspirin Plavix he recommends against any active procedure or treatment at this time, but does advised application of lidocaine jelly as needed and sitz baths.  Dr. Adora Fridge would be happy to see the patient and advised that he may be seen in the clinic on Tuesday or Wednesday of this week.  Patient is stable.  No signs or symptoms to suggest GI bleeding, apparent hemorrhoid has stopped bleeding at this time.  Return precautions and treatment recommendations and follow-up discussed with the patient who is agreeable with the plan.       ____________________________________________   FINAL CLINICAL IMPRESSION(S) / ED DIAGNOSES  Final diagnoses:  External hemorrhoid, bleeding      NEW MEDICATIONS STARTED DURING THIS VISIT:  Discharge Medication List as of 12/24/2016  6:16 PM    START taking these medications   Details  lidocaine (XYLOCAINE)  5 % ointment Apply 1 application topically 3 (three) times daily as needed. Apply a small amount to hemorrhoids up to 3 times daily for discomfort, Starting Sun 12/24/2016, Print         Note:  This document was prepared using Dragon voice recognition software and may include unintentional dictation errors.  Delman Kitten, MD 12/24/16 [phone]

## 2016-12-24 NOTE — Discharge Instructions (Signed)
°  Please return to the emergency room right away if you are to develop heavy or steady bleeding, a fever, severe nausea, begin vomiting any dark or bloody fluid, you develop any dark or bloody stools, feel dehydrated, or other new concerns or symptoms arise.

## 2016-12-24 NOTE — ED Triage Notes (Signed)
Patient report having bleeding hemorrhoids for two days.  Patient states hemorrhoids are bleeding freely and has "pus". Patient reports being on Plavix and aspirin due to having cardiac stents and a TIA.

## 2016-12-26 DIAGNOSIS — K645 Perianal venous thrombosis: Secondary | ICD-10-CM | POA: Diagnosis not present

## 2017-01-03 DIAGNOSIS — K648 Other hemorrhoids: Secondary | ICD-10-CM | POA: Diagnosis not present

## 2017-01-03 DIAGNOSIS — K645 Perianal venous thrombosis: Secondary | ICD-10-CM | POA: Diagnosis not present

## 2017-01-10 ENCOUNTER — Encounter: Payer: Self-pay | Admitting: Internal Medicine

## 2017-01-10 ENCOUNTER — Ambulatory Visit: Payer: Medicare HMO | Admitting: Internal Medicine

## 2017-01-10 VITALS — BP 130/64 | HR 60 | Temp 98.6°F | Resp 14 | Wt 207.8 lb

## 2017-01-10 DIAGNOSIS — E7800 Pure hypercholesterolemia, unspecified: Secondary | ICD-10-CM | POA: Diagnosis not present

## 2017-01-10 DIAGNOSIS — N189 Chronic kidney disease, unspecified: Secondary | ICD-10-CM

## 2017-01-10 DIAGNOSIS — I2510 Atherosclerotic heart disease of native coronary artery without angina pectoris: Secondary | ICD-10-CM | POA: Diagnosis not present

## 2017-01-10 DIAGNOSIS — R059 Cough, unspecified: Secondary | ICD-10-CM

## 2017-01-10 DIAGNOSIS — R05 Cough: Secondary | ICD-10-CM

## 2017-01-10 DIAGNOSIS — C61 Malignant neoplasm of prostate: Secondary | ICD-10-CM

## 2017-01-10 DIAGNOSIS — R739 Hyperglycemia, unspecified: Secondary | ICD-10-CM | POA: Diagnosis not present

## 2017-01-10 DIAGNOSIS — I10 Essential (primary) hypertension: Secondary | ICD-10-CM | POA: Diagnosis not present

## 2017-01-10 DIAGNOSIS — K649 Unspecified hemorrhoids: Secondary | ICD-10-CM | POA: Diagnosis not present

## 2017-01-10 DIAGNOSIS — K2270 Barrett's esophagus without dysplasia: Secondary | ICD-10-CM

## 2017-01-10 DIAGNOSIS — K22719 Barrett's esophagus with dysplasia, unspecified: Secondary | ICD-10-CM | POA: Diagnosis not present

## 2017-01-10 DIAGNOSIS — G4730 Sleep apnea, unspecified: Secondary | ICD-10-CM

## 2017-01-10 DIAGNOSIS — Z8673 Personal history of transient ischemic attack (TIA), and cerebral infarction without residual deficits: Secondary | ICD-10-CM | POA: Diagnosis not present

## 2017-01-10 DIAGNOSIS — J449 Chronic obstructive pulmonary disease, unspecified: Secondary | ICD-10-CM

## 2017-01-10 MED ORDER — IPRATROPIUM-ALBUTEROL 20-100 MCG/ACT IN AERS: INHALATION_SPRAY | RESPIRATORY_TRACT | 3 refills | Status: DC

## 2017-01-10 NOTE — Progress Notes (Signed)
Patient ID: Nathaniel Thompson, male   DOB: March 27, 1935, 81 y.o.   MRN: [id number]   Subjective:    Patient ID: Nathaniel Thompson, male    DOB: 1935-09-24, 81 y.o.   MRN: [id number]  HPI  Patient here for a scheduled follow up. States he has been doing relatively well.  Does report some increased congestion over the last few weeks.  Is better.  Still with some drainage.  No sinus pressure.  Some cough.  Occasionally productive.  No sob.  No chest pain or tightness.  No acid reflux.  No abdominal pain.  Bowels moving.  He recently saw Dr Tamala Julian for hemorrhoid - thrombosed.  Better now.  Has f/u planned over the next month.     Past Medical History:  Diagnosis Date  . Allergic rhinitis   . Barrett's esophagus 10/21/13  . Chronic kidney disease    stones  . COPD (chronic obstructive pulmonary disease) (HCC)    mild COPD. former smoker  . Coronary artery disease   . Diabetes mellitus without complication ([place])   . Diverticulosis   . Gastritis   . GERD (gastroesophageal reflux disease)   . Hypercholesteremia   . Hypertension   . Prostate cancer ([place])   . Skin cancer    left cheek/lesion excised  . Sleep apnea   . TIA (transient ischemic attack)   . TIA (transient ischemic attack)    Past Surgical History:  Procedure Laterality Date  . CARDIAC CATHETERIZATION    . CORONARY ANGIOPLASTY    . EYE SURGERY  2013   CATARACT EXTRACTION  . heart cath stent    . PROSTATE BIOPSY    . stents     multiple   Family History  Problem Relation Age of Onset  . Cancer Mother        gastric and lung  . Cancer Father        multiple myeloma  . Stroke Father   . Cancer Sister        leukemia  . Cancer Brother        leukemia  . Cancer Brother        kidney  . Cancer Daughter        Uterine  . Cancer Other        [place]ephes (Sister's Son): Prostate   Social History   Socioeconomic History  . Marital status: Married    Spouse name: None  . Number of children: None  . Years of education: None    . Highest education level: None  Social Needs  . Financial resource strain: None  . Food insecurity - worry: None  . Food insecurity - inability: None  . Transportation needs - medical: None  . Transportation needs - non-medical: None  Occupational History  . None  Tobacco Use  . Smoking status: Former Smoker    Packs/day: 1.00    Years: 35.00    Pack years: 35.00    Types: Cigarettes    Last attempt to quit: 03/06/1986    Years since quitting: 30.8  . Smokeless tobacco: Former Systems developer    Types: Chew  Substance and Sexual Activity  . Alcohol use: No  . Drug use: No  . Sexual activity: Not Currently  Other Topics Concern  . None  Social History Narrative  . None    Outpatient Encounter Medications as of 01/10/2017  Medication Sig  . aspirin 81 MG tablet Take 81 mg by mouth daily.  . clopidogrel (PLAVIX)  75 MG tablet Take 1 tablet (75 mg total) by mouth daily.  . fluticasone (FLONASE) 50 MCG/ACT nasal spray Place 2 sprays into both nostrils daily.  . Ipratropium-Albuterol (COMBIVENT) 20-100 MCG/ACT AERS respimat 2 puffs qid prn  . isosorbide mononitrate (IMDUR) 30 MG 24 hr tablet TAKE 1 TABLET BY MOUTH ONCE DAILY  . losartan (COZAAR) 50 MG tablet Take 50 mg by mouth daily.  [name] lovastatin (MEVACOR) 40 MG tablet TAKE 2 TABLETS BY MOUTH AT BEDTIME  . Multiple Vitamin (MULTI-VITAMINS) TABS Take by mouth.  . nitroGLYCERIN (NITROSTAT) 0.4 MG SL tablet Place 0.4 mg under the tongue every 5 (five) minutes as needed for chest pain.  . pantoprazole (PROTONIX) 40 MG tablet Take 40 mg by mouth 2 (two) times daily.   . ranitidine (ZANTAC) 150 MG tablet   . [DISCONTINUED] Ipratropium-Albuterol (COMBIVENT) 20-100 MCG/ACT AERS respimat Inhale into the lungs.  . [DISCONTINUED] amLODipine (NORVASC) 5 MG tablet Take 1 tablet (5 mg total) by mouth daily.  . [DISCONTINUED] lidocaine (XYLOCAINE) 5 % ointment Apply 1 application topically 3 (three) times daily as needed. Apply a small amount to  hemorrhoids up to 3 times daily for discomfort   No facility-administered encounter medications on file as of 01/10/2017.     Review of Systems  Constitutional: Negative for appetite change and unexpected weight change.  HENT: Positive for congestion and postnasal drip. Negative for sinus pressure.   Respiratory: Positive for cough. Negative for chest tightness and shortness of breath.   Cardiovascular: Negative for chest pain, palpitations and leg swelling.  Gastrointestinal: Negative for abdominal pain, diarrhea, nausea and vomiting.  Genitourinary: Negative for difficulty urinating and dysuria.  Musculoskeletal: Negative for joint swelling and myalgias.  Skin: Negative for color change and rash.  Neurological: Negative for dizziness, light-headedness and headaches.  Psychiatric/Behavioral: Negative for agitation and dysphoric mood.       Objective:    Physical Exam  Constitutional: He appears well-developed and well-nourished. No distress.  HENT:  Nose: Nose normal.  Mouth/Throat: Oropharynx is clear and moist.  Neck: Neck supple. No thyromegaly present.  Cardiovascular: Normal rate and regular rhythm.  Pulmonary/Chest: Effort normal and breath sounds normal. No respiratory distress.  Abdominal: Soft. Bowel sounds are normal. There is no tenderness.  Musculoskeletal: He exhibits no edema or tenderness.  Lymphadenopathy:    He has no cervical adenopathy.  Skin: No rash noted. No erythema.  Psychiatric: He has a normal mood and affect. His behavior is normal.    BP 130/64 (BP Location: Left Arm, Patient Position: Sitting, Cuff Size: Normal)   Pulse 60   Temp 98.6 F (37 C) (Oral)   Resp 14   Wt 207 lb 12.8 oz (94.3 kg)   SpO2 98%   BMI 29.82 kg/m  Wt Readings from Last 3 Encounters:  01/10/17 207 lb 12.8 oz (94.3 kg)  12/24/16 207 lb (93.9 kg)  08/07/16 206 lb 9.1 oz (93.7 kg)     Lab Results  Component Value Date   WBC 6.2 12/24/2016   HGB 14.4 12/24/2016    HCT 42.9 12/24/2016   PLT 193 12/24/2016   GLUCOSE 102 (H) 01/12/2017   CHOL 141 01/12/2017   TRIG 77.0 01/12/2017   HDL 36.30 (L) 01/12/2017   LDLCALC 89 01/12/2017   ALT 14 01/12/2017   AST 19 01/12/2017   NA 139 01/12/2017   K 4.6 01/12/2017   CL 105 01/12/2017   CREATININE 1.23 01/12/2017   BUN 18 01/12/2017   CO2 28  01/12/2017   TSH 1.76 07/13/2016   PSA 5.47 (H) 08/03/2016   HGBA1C 5.6 01/12/2017       Assessment & Plan:   Problem List Items Addressed This Visit    Barrett's esophagus    On protonix.  Followed by GI - Dr Gustavo Lah.        CAD (coronary artery disease)    Has known CAD.  S/p stent placement.  Followed by Dr Chancy Milroy.  Continue risk factor modification.       CKD (chronic kidney disease)    Stable.  Avoid antiinflammatories.  Follow metabolic panel.        COPD (chronic obstructive pulmonary disease) (HCC)    Breathing stable.  Uses combivent prn.        Relevant Medications   Ipratropium-Albuterol (COMBIVENT) 20-100 MCG/ACT AERS respimat   History of TIA (transient ischemic attack)    On aspirin and plavix.  Doing well.  Continue risk factor modification.        Hypercholesterolemia    On lovastatin.  lipitor caused aching.  Low cholesterol diet and exercise.  Follow lipid panel and liver function tests.        Hyperglycemia    Low carb diet and exercise.  Follow met b and a1c.        Hypertension, essential    Blood pressure under good control.  Continue same medication regimen.  Follow pressures.  Follow metabolic panel.        Malignant neoplasm of prostate ([place])    Completed XRT 03/2016.  Followed by Dr Karsten Ro - with [facility].        Sleep apnea    Uses CPAP regularly.         Other Visit Diagnoses    Hemorrhoids, unspecified hemorrhoid type    -  Primary   saw Dr Tamala Julian.  better.  has f/u planned within the month.     Cough       Increased drainage and cough.  Hold abx.  Robitussin DM as directed.  Saline nasal spray and  steroid nasal spray as directed.  Follow.         Einar Pheasant, MD

## 2017-01-10 NOTE — Patient Instructions (Signed)
Robitussin DM twice a day as needed for cough and congestion  Saline nasal spray - flush nose at least 2-3x/day  flonase nasal spray - 2 sprays each nostril one time per day.  Do this in the evening.

## 2017-01-11 ENCOUNTER — Telehealth: Payer: Self-pay | Admitting: Radiology

## 2017-01-11 ENCOUNTER — Other Ambulatory Visit: Payer: Self-pay | Admitting: Internal Medicine

## 2017-01-11 DIAGNOSIS — I10 Essential (primary) hypertension: Secondary | ICD-10-CM

## 2017-01-11 DIAGNOSIS — R739 Hyperglycemia, unspecified: Secondary | ICD-10-CM

## 2017-01-11 DIAGNOSIS — E7800 Pure hypercholesterolemia, unspecified: Secondary | ICD-10-CM

## 2017-01-11 NOTE — Progress Notes (Signed)
Orders placed for labs

## 2017-01-11 NOTE — Telephone Encounter (Signed)
Pt coming in for labs tomorrow, please place future orders. Thank you.  

## 2017-01-11 NOTE — Telephone Encounter (Signed)
Orders placed for labs

## 2017-01-12 ENCOUNTER — Other Ambulatory Visit (INDEPENDENT_AMBULATORY_CARE_PROVIDER_SITE_OTHER): Payer: Medicare HMO

## 2017-01-12 DIAGNOSIS — I10 Essential (primary) hypertension: Secondary | ICD-10-CM | POA: Diagnosis not present

## 2017-01-12 DIAGNOSIS — E7800 Pure hypercholesterolemia, unspecified: Secondary | ICD-10-CM | POA: Diagnosis not present

## 2017-01-12 DIAGNOSIS — R739 Hyperglycemia, unspecified: Secondary | ICD-10-CM | POA: Diagnosis not present

## 2017-01-12 LAB — LIPID PANEL
CHOL/HDL RATIO: 4
Cholesterol: 141 mg/dL (ref 0–200)
HDL: 36.3 mg/dL — ABNORMAL LOW (ref >39.00)
LDL Cholesterol: 89 mg/dL (ref 0–99)
NONHDL: 104.56
TRIGLYCERIDES: 77 mg/dL (ref 0.0–149.0)
VLDL: 15.4 mg/dL (ref 0.0–40.0)

## 2017-01-12 LAB — BASIC METABOLIC PANEL
BUN: 18 mg/dL (ref 6–23)
CALCIUM: 9.8 mg/dL (ref 8.4–10.5)
CO2: 28 meq/L (ref 19–32)
CREATININE: 1.23 mg/dL (ref 0.40–1.50)
Chloride: 105 mEq/L (ref 96–112)
GFR: 60.31 mL/min (ref >60.00)
Glucose, Bld: 102 mg/dL — ABNORMAL HIGH (ref 70–99)
Potassium: 4.6 mEq/L (ref 3.5–5.1)
Sodium: 139 mEq/L (ref 135–145)

## 2017-01-12 LAB — HEPATIC FUNCTION PANEL
ALK PHOS: 53 U/L (ref 39–117)
ALT: 14 U/L (ref 0–53)
AST: 19 U/L (ref 0–37)
Albumin: 4.1 g/dL (ref 3.5–5.2)
BILIRUBIN DIRECT: 0.2 mg/dL (ref 0.0–0.3)
TOTAL PROTEIN: 7 g/dL (ref 6.0–8.3)
Total Bilirubin: 0.9 mg/dL (ref 0.2–1.2)

## 2017-01-12 LAB — HEMOGLOBIN A1C: Hgb A1c MFr Bld: 5.6 % (ref 4.6–6.5)

## 2017-01-13 ENCOUNTER — Encounter: Payer: Self-pay | Admitting: Internal Medicine

## 2017-01-13 NOTE — Assessment & Plan Note (Signed)
Low carb diet and exercise.  Follow met b and a1c.

## 2017-01-13 NOTE — Assessment & Plan Note (Signed)
Breathing stable.  Uses combivent prn.

## 2017-01-13 NOTE — Assessment & Plan Note (Signed)
On protonix.  Followed by GI - Dr Gustavo Lah.

## 2017-01-13 NOTE — Assessment & Plan Note (Signed)
On aspirin and plavix.  Doing well.  Continue risk factor modification.

## 2017-01-13 NOTE — Assessment & Plan Note (Signed)
Blood pressure under good control.  Continue same medication regimen.  Follow pressures.  Follow metabolic panel.   

## 2017-01-13 NOTE — Assessment & Plan Note (Signed)
Stable.  Avoid antiinflammatories.  Follow metabolic panel.

## 2017-01-13 NOTE — Assessment & Plan Note (Signed)
On lovastatin.  lipitor caused aching.  Low cholesterol diet and exercise.  Follow lipid panel and liver function tests.

## 2017-01-13 NOTE — Assessment & Plan Note (Signed)
Has known CAD.  S/p stent placement.  Followed by Dr Chancy Milroy.  Continue risk factor modification.

## 2017-01-13 NOTE — Assessment & Plan Note (Signed)
Completed XRT 03/2016.  Followed by Dr Karsten Ro - with [facility].

## 2017-01-13 NOTE — Assessment & Plan Note (Signed)
Uses CPAP regularly °

## 2017-01-16 ENCOUNTER — Ambulatory Visit: Payer: Medicare HMO | Admitting: Internal Medicine

## 2017-01-29 ENCOUNTER — Other Ambulatory Visit: Payer: Medicare HMO

## 2017-02-01 ENCOUNTER — Other Ambulatory Visit: Payer: Self-pay | Admitting: Internal Medicine

## 2017-02-02 NOTE — Telephone Encounter (Signed)
Sam with [facility] states pt is out of the medication

## 2017-02-05 ENCOUNTER — Ambulatory Visit: Payer: Medicare HMO | Admitting: Radiation Oncology

## 2017-02-05 DIAGNOSIS — K648 Other hemorrhoids: Secondary | ICD-10-CM | POA: Diagnosis not present

## 2017-02-28 DIAGNOSIS — C61 Malignant neoplasm of prostate: Secondary | ICD-10-CM | POA: Diagnosis not present

## 2017-03-07 ENCOUNTER — Other Ambulatory Visit (HOSPITAL_COMMUNITY): Payer: Self-pay | Admitting: Urology

## 2017-03-07 DIAGNOSIS — C61 Malignant neoplasm of prostate: Secondary | ICD-10-CM | POA: Diagnosis not present

## 2017-03-07 DIAGNOSIS — R9721 Rising PSA following treatment for malignant neoplasm of prostate: Secondary | ICD-10-CM | POA: Diagnosis not present

## 2017-03-13 DIAGNOSIS — Z872 Personal history of diseases of the skin and subcutaneous tissue: Secondary | ICD-10-CM | POA: Diagnosis not present

## 2017-03-13 DIAGNOSIS — Z85828 Personal history of other malignant neoplasm of skin: Secondary | ICD-10-CM | POA: Diagnosis not present

## 2017-03-13 DIAGNOSIS — L578 Other skin changes due to chronic exposure to nonionizing radiation: Secondary | ICD-10-CM | POA: Diagnosis not present

## 2017-03-13 DIAGNOSIS — L570 Actinic keratosis: Secondary | ICD-10-CM | POA: Diagnosis not present

## 2017-03-13 DIAGNOSIS — Z1283 Encounter for screening for malignant neoplasm of skin: Secondary | ICD-10-CM | POA: Diagnosis not present

## 2017-03-19 ENCOUNTER — Encounter (HOSPITAL_COMMUNITY): Payer: Medicare HMO

## 2017-03-22 ENCOUNTER — Encounter (HOSPITAL_COMMUNITY)
Admission: RE | Admit: 2017-03-22 | Discharge: 2017-03-22 | Disposition: A | Discharge status: Home / Self Care | Payer: Medicare HMO | Source: Ambulatory Visit | Attending: Urology | Admitting: Urology

## 2017-03-22 DIAGNOSIS — C61 Malignant neoplasm of prostate: Secondary | ICD-10-CM

## 2017-03-22 MED ORDER — TECHNETIUM TC 99M MEDRONATE IV KIT
21.7000 | PACK | Freq: Once | INTRAVENOUS | Status: AC | PRN
  Administered 2017-03-22: 21.7 via INTRAVENOUS

## 2017-03-27 DIAGNOSIS — N281 Cyst of kidney, acquired: Secondary | ICD-10-CM | POA: Diagnosis not present

## 2017-03-27 DIAGNOSIS — R9721 Rising PSA following treatment for malignant neoplasm of prostate: Secondary | ICD-10-CM | POA: Diagnosis not present

## 2017-03-27 DIAGNOSIS — C7951 Secondary malignant neoplasm of bone: Secondary | ICD-10-CM | POA: Diagnosis not present

## 2017-03-27 DIAGNOSIS — C61 Malignant neoplasm of prostate: Secondary | ICD-10-CM | POA: Diagnosis not present

## 2017-03-27 DIAGNOSIS — N200 Calculus of kidney: Secondary | ICD-10-CM | POA: Diagnosis not present

## 2017-03-30 DIAGNOSIS — D492 Neoplasm of unspecified behavior of bone, soft tissue, and skin: Secondary | ICD-10-CM | POA: Diagnosis not present

## 2017-03-30 DIAGNOSIS — M67471 Ganglion, right ankle and foot: Secondary | ICD-10-CM | POA: Diagnosis not present

## 2017-04-04 ENCOUNTER — Other Ambulatory Visit: Payer: Self-pay | Admitting: Family Medicine

## 2017-04-04 DIAGNOSIS — C61 Malignant neoplasm of prostate: Secondary | ICD-10-CM

## 2017-04-09 ENCOUNTER — Ambulatory Visit: Payer: Medicare HMO | Admitting: Family Medicine

## 2017-04-09 ENCOUNTER — Encounter: Payer: Self-pay | Admitting: Family Medicine

## 2017-04-09 DIAGNOSIS — L539 Erythematous condition, unspecified: Secondary | ICD-10-CM

## 2017-04-09 MED ORDER — AMOXICILLIN-POT CLAVULANATE 875-125 MG PO TABS: 1.0000 | ORAL_TABLET | Freq: Two times a day (BID) | ORAL | 0 refills | Status: DC

## 2017-04-09 NOTE — Progress Notes (Signed)
Sx started about 2 days ago.  He has a knot in the R lateral nostril.  Sore to the touch.  No fevers.  No vomiting, no diarrhea. No ear pain. No ST.  Rare cough.  Still using inhaler as needed, occ use recently.  No L sided nasal pain.  No bleeding.  Some facial pressure noted.    Meds, vitals, and allergies reviewed.   ROS: Per HPI unless specifically indicated in ROS section   GEN: nad, alert and oriented HEENT: mucous membranes moist, tm w/o erythema, nasal exam w/o erythema on the L but R nostril with lateral erythema w/o abscess, is ttp locally externally w/o external erythema, clear discharge noted,  OP with cobblestoning NECK: supple w/o LA CV: rrr.   PULM: ctab, no inc wob EXT: no edema

## 2017-04-09 NOTE — Patient Instructions (Signed)
Start augmentin and update me as needed. Blow your nose only gently.  Take care.  Glad to see you.

## 2017-04-10 DIAGNOSIS — L539 Erythematous condition, unspecified: Secondary | ICD-10-CM | POA: Insufficient documentation

## 2017-04-10 NOTE — Assessment & Plan Note (Signed)
No abscess seen.  Given exam, would start augmentin and update me as needed. Blow nose only gently. Okay for outpatient f/u.

## 2017-04-11 ENCOUNTER — Inpatient Hospital Stay: Payer: Medicare HMO | Attending: Internal Medicine | Admitting: Internal Medicine

## 2017-04-11 VITALS — BP 154/73 | HR 62 | Temp 97.5°F | Resp 16 | Wt 210.6 lb

## 2017-04-11 DIAGNOSIS — R3915 Urgency of urination: Secondary | ICD-10-CM | POA: Diagnosis not present

## 2017-04-11 DIAGNOSIS — C7951 Secondary malignant neoplasm of bone: Secondary | ICD-10-CM

## 2017-04-11 DIAGNOSIS — C61 Malignant neoplasm of prostate: Secondary | ICD-10-CM | POA: Diagnosis not present

## 2017-04-11 DIAGNOSIS — R3912 Poor urinary stream: Secondary | ICD-10-CM

## 2017-04-11 MED ORDER — SILODOSIN 4 MG PO CAPS: 4.0000 mg | ORAL_CAPSULE | Freq: Every day | ORAL | 3 refills | Status: DC

## 2017-04-11 NOTE — Progress Notes (Signed)
[name] CONSULT NOTE  Patient Care Team: Einar Pheasant, MD as PCP - General (Internal Medicine)  CHIEF COMPLAINTS/PURPOSE OF CONSULTATION:  Prostate cancer  #  Oncology History   Nov 2017- completed IM RT radiation therapy to his prostate and pelvic nodes for Gleason 7 (4+3) adenocarcinoma the prostate presenting the PSA of 7.8.  # JAN 2019- METASTATIC PROSTATE CA to Bone [low volume met on bone scan; CT- NED]; PSA ~50  # [name], [place]]     Malignant neoplasm of prostate ([place])     HISTORY OF PRESENTING ILLNESS:  Nathaniel Thompson 82 y.o.  male originally diagnosed with prostate cancer Gleason 4+3-status post radiation November 2017-is here for opinion regarding his elevated PSA/new found metastases to the bone.   Patient states that after radiation which he finished November 2017-sometime in May 2018 noted to have elevated PSA.  However further follow-up of PSA-showed elevation up to 50.  Patient appropriately got bone scan-that showed multiple lesions in the pelvis/vertebral bodies.  CT of the abdomen pelvis did not show any visceral disease.  Patient has been started on ADT.  He is here to discuss further treatment options/accompanied by his daughter.  Patient denies having any bone pain.  Denies any weight loss or loss of appetite.  Appetite is good.  Denies any headaches.  No chest pain or shortness of breath or cough.  Denies any hot flashes.  Patient does complain of weak stream; urgency in urination.  And having to wake up at night for urination.  Denies any burning pain.  Denies any back pain or fevers.  ROS: A complete 10 point review of system is done which is negative except mentioned above in history of present illness  MEDICAL HISTORY:  Past Medical History:  Diagnosis Date  . Allergic rhinitis   . Barrett's esophagus 10/21/13  . Chronic kidney disease    stones  . COPD (chronic obstructive pulmonary disease) (HCC)    mild COPD. former  smoker  . Coronary artery disease   . Diabetes mellitus without complication ([place])   . Diverticulosis   . Gastritis   . GERD (gastroesophageal reflux disease)   . Hypercholesteremia   . Hypertension   . Prostate cancer ([place])   . Prostatism   . Skin cancer    left cheek/lesion excised  . Sleep apnea   . TIA (transient ischemic attack)   . TIA (transient ischemic attack)     SURGICAL HISTORY: Past Surgical History:  Procedure Laterality Date  . CARDIAC CATHETERIZATION    . COLONOSCOPY WITH PROPOFOL N/A 09/13/2015   Procedure: COLONOSCOPY WITH PROPOFOL;  Surgeon: Lollie Sails, MD;  Location: [facility] ENDOSCOPY;  Service: Endoscopy;  Laterality: N/A;  . CORONARY ANGIOPLASTY    . ESOPHAGOGASTRODUODENOSCOPY (EGD) WITH PROPOFOL N/A 09/13/2015   Procedure: ESOPHAGOGASTRODUODENOSCOPY (EGD) WITH PROPOFOL;  Surgeon: Lollie Sails, MD;  Location: [facility] ENDOSCOPY;  Service: Endoscopy;  Laterality: N/A;  . ESOPHAGOGASTRODUODENOSCOPY (EGD) WITH PROPOFOL N/A 12/28/2015   Procedure: ESOPHAGOGASTRODUODENOSCOPY (EGD) WITH PROPOFOL;  Surgeon: Lollie Sails, MD;  Location[facility] ENDOSCOPY;  Service: Endoscopy;  Laterality: N/A;  . ESOPHAGOGASTRODUODENOSCOPY (EGD) WITH PROPOFOL N/A 06/16/2016   Procedure: ESOPHAGOGASTRODUODENOSCOPY (EGD) WITH PROPOFOL;  Surgeon: Lollie Sails, MD;  Location: [facility] ENDOSCOPY;  Service: Endoscopy;  Laterality: N/A;  . EYE SURGERY  2013   CATARACT EXTRACTION  . heart cath stent    . PROSTATE BIOPSY    . stents     multiple    SOCIAL  HISTORY: [place]; [place];  Used to smoke;  quit 80s; no alcohol.  2 biological girls.  Social History   Socioeconomic History  . Marital status: Married    Spouse name: Not on file  . Number of children: Not on file  . Years of education: Not on file  . Highest education level: Not on file  Social Needs  . Financial resource strain: Not on file  . Food insecurity - worry: Not on file  . Food  insecurity - inability: Not on file  . Transportation needs - medical: Not on file  . Transportation needs - non-medical: Not on file  Occupational History  . Not on file  Tobacco Use  . Smoking status: Former Smoker    Packs/day: 1.00    Years: 35.00    Pack years: 35.00    Types: Cigarettes    Last attempt to quit: 03/06/1986    Years since quitting: 31.1  . Smokeless tobacco: Former Systems developer    Types: Chew  Substance and Sexual Activity  . Alcohol use: No  . Drug use: No  . Sexual activity: Not Currently  Other Topics Concern  . Not on file  Social History Narrative  . Not on file    FAMILY HISTORY: dad- MM; mother- lung cancer; oldest bro-th- kidney ca; brother- leukemia; sister- leukemia/ mild.  Family History  Problem Relation Age of Onset  . Cancer Mother        gastric and lung  . Cancer Father        multiple myeloma  . Stroke Father   . Cancer Sister        leukemia  . Cancer Brother        leukemia  . Cancer Brother        kidney  . Cancer Daughter        Uterine  . Cancer Other        [place] Son): Prostate    ALLERGIES:  is allergic to atorvastatin.  MEDICATIONS:  Current Outpatient Medications  Medication Sig Dispense Refill  . amoxicillin-clavulanate (AUGMENTIN) 875-125 MG tablet Take 1 tablet by mouth 2 (two) times daily. 20 tablet 0  . aspirin 81 MG tablet Take 81 mg by mouth daily.    . calcium citrate (CALCITRATE - DOSED IN MG ELEMENTAL CALCIUM) 950 MG tablet Take 200 mg of elemental calcium by mouth daily.    . cholecalciferol (VITAMIN D) 1000 units tablet Take 2,000 Units by mouth daily.    . clopidogrel (PLAVIX) 75 MG tablet Take 1 tablet (75 mg total) by mouth daily. 90 tablet 0  . fexofenadine (ALLEGRA) 180 MG tablet Take 180 mg by mouth daily as needed for allergies or rhinitis.    . fluticasone (FLONASE) 50 MCG/ACT nasal spray Place 2 sprays into both nostrils daily.    . Ipratropium-Albuterol (COMBIVENT) 20-100 MCG/ACT AERS  respimat 2 puffs qid prn 1 Inhaler 3  . isosorbide mononitrate (IMDUR) 30 MG 24 hr tablet TAKE 1 TABLET BY MOUTH ONCE DAILY 90 tablet 2  . losartan (COZAAR) 50 MG tablet Take 50 mg by mouth daily.    [name] lovastatin (MEVACOR) 40 MG tablet TAKE 2 TABLETS BY MOUTH AT BEDTIME 180 tablet 1  . Multiple Vitamin (MULTI-VITAMINS) TABS Take by mouth.    . nitroGLYCERIN (NITROSTAT) 0.4 MG SL tablet Place 0.4 mg under the tongue every 5 (five) minutes as needed for chest pain.    . pantoprazole (PROTONIX) 40 MG tablet Take  40 mg by mouth 2 (two) times daily.     . ranitidine (ZANTAC) 150 MG tablet     . silodosin (RAPAFLO) 4 MG CAPS capsule Take 1 capsule (4 mg total) by mouth daily with breakfast. 30 capsule 3   No current facility-administered medications for this visit.       [name]  PHYSICAL EXAMINATION: ECOG PERFORMANCE STATUS: 0 - Asymptomatic  Vitals:   04/11/17 1502  BP: (!) 154/73  Pulse: 62  Resp: 16  Temp: (!) 97.5 F (36.4 C)   Filed Weights   04/11/17 1502  Weight: 210 lb 9.6 oz (95.5 kg)    GENERAL: Well-nourished well-developed; Alert, no distress and comfortable.   With his wife/family.  EYES: no pallor or icterus OROPHARYNX: no thrush or ulceration; good dentition  NECK: supple, no masses felt LYMPH:  no palpable lymphadenopathy in the cervical, axillary or inguinal regions LUNGS: clear to auscultation and  No wheeze or crackles HEART/CVS: regular rate & rhythm and no murmurs; No lower extremity edema ABDOMEN: abdomen soft, non-tender and normal bowel sounds Musculoskeletal:no cyanosis of digits and no clubbing  PSYCH: alert & oriented x 3 with fluent speech NEURO: no focal motor/sensory deficits SKIN:  no rashes or significant lesions  LABORATORY DATA:  I have reviewed the data as listed Lab Results  Component Value Date   WBC 6.2 12/24/2016   HGB 14.4 12/24/2016   HCT 42.9 12/24/2016   MCV 89.9 12/24/2016   PLT 193 12/24/2016   Recent Labs    07/13/16 0802  12/24/16 1520 01/12/17 1020  NA 140 139 139  K 4.4 3.9 4.6  CL 107 107 105  CO2 _0 GLUCOSE 106* 130* 102*  BUN _1 CREATININE 1.27 1.23 1.23  CALCIUM 9.3 9.2 9.8  GFRNONAA  --  54*  --   GFRAA  --  >60  --   PROT 6.4 7.1 7.0  ALBUMIN 3.9 4.0 4.1  AST 16 34 19  ALT _2 ALKPHOS 48 48 53  BILITOT 0.8 0.8 0.9  BILIDIR 0.2  --  0.2    RADIOGRAPHIC STUDIES: I have personally reviewed the radiological images as listed and agreed with the findings in the report. Nm Bone Scan Whole Body  Result Date: 03/22/2017 CLINICAL DATA:  82 year old male with prostate cancer. EXAM: NUCLEAR MEDICINE WHOLE BODY BONE SCAN TECHNIQUE: Whole body anterior and posterior images were obtained approximately 3 hours after intravenous injection of radiopharmaceutical. RADIOPHARMACEUTICALS:  21.7 mCi Technetium-76mMDP IV COMPARISON:  CT Abdomen and Pelvis 03/07/2017 FINDINGS: Expected radiotracer activity in both kidneys and the urinary bladder. Asymmetric increased radiotracer activity about the left shoulder, the left knee, both elbows, the right wrist, and both feet (worse on the right) appears to be degenerative in nature. Rounded foci of abnormal radiotracer activity in the bilateral sacrum, the left acetabulum, and in a lower lumbar level (approximately L3) are highly suspicious for osseous metastatic disease. A linear level of radiotracer activity in the midthoracic spine, approximately the T7 vertebra, is indeterminate and might be related to an osteoporotic compression fracture. The remaining axial skeleton activity is within normal limits. IMPRESSION: 1. Abnormal rounded foci of radiotracer activity in the lumbar spine, the bilateral sacrum, and the left acetabulum are highly suspicious for metastatic prostate cancer. 2. Indeterminate focus of radiotracer activity in a midthoracic vertebra, probably T7. 3. Superimposed degenerative radiotracer activity in the appendicular skeleton.  Electronically Signed   By: HHerminio HeadsD.  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  I reviewed the staging and pathology with the patient and family in detail.  Stage IV; unfortunately not curable.  # I agree with current ADT therapy-Firmagon /followed by Lupron as per urology.  I had a long discussion with the patient and family regarding the natural history of prostate cancer with eventual progression including castrate resistant phase.  # I discussed that patient might benefit from addition of Zytiga to the ADT in an slowing down the progression-based on 2 clinical trials-stampede and latitude. Given the low volume metastatic disease in the bone/absence of visible disease on the CT scan-I would not recommend systemic therapy with docetaxel.   #Prostatism symptoms-recommend Rapaflo 4 mg a day at nighttime.  The potential side effects of orthostatic hypotension was discussed  #Follow-up with me the first week of April/after visit with Dr. Karsten Ro.   # I reviewed the blood work- with the patient in detail; also reviewed the imaging independently [as summarized above]; and with the patient in detail.   Thank you Dr.Otlin for allowing me to participate in the care of your pleasant patient. Please do not hesitate to contact me with questions or concerns in the interim.  Cc; Dr.Ottlin; Scott.   All questions were answered. The patient knows to call the clinic with any problems, questions or concerns.    Cammie Sickle, MD 04/17/2017 1:47 PM

## 2017-04-11 NOTE — Assessment & Plan Note (Addendum)
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  I reviewed the staging and pathology with the patient and family in detail.  Stage IV; unfortunately not curable.  # I agree with current ADT therapy-Firmagon /followed by Lupron as per urology.  I had a long discussion with the patient and family regarding the natural history of prostate cancer with eventual progression including castrate resistant phase.  # I discussed that patient might benefit from addition of Zytiga to the ADT in an slowing down the progression-based on 2 clinical trials-stampede and latitude. Given the low volume metastatic disease in the bone/absence of visible disease on the CT scan-I would not recommend systemic therapy with docetaxel.   #Prostatism symptoms-recommend Rapaflo 4 mg a day at nighttime.  The potential side effects of orthostatic hypotension was discussed  #Follow-up with me the first week of April/after visit with Dr. Karsten Ro.   # I reviewed the blood work- with the patient in detail; also reviewed the imaging independently [as summarized above]; and with the patient in detail.   Thank you Dr.Otlin for allowing me to participate in the care of your pleasant patient. Please do not hesitate to contact me with questions or concerns in the interim.  Cc; Dr.Ottlin; Scott.

## 2017-04-12 ENCOUNTER — Encounter: Payer: Self-pay | Admitting: *Deleted

## 2017-04-13 DIAGNOSIS — G4730 Sleep apnea, unspecified: Secondary | ICD-10-CM | POA: Diagnosis not present

## 2017-04-13 DIAGNOSIS — I10 Essential (primary) hypertension: Secondary | ICD-10-CM | POA: Diagnosis not present

## 2017-04-13 DIAGNOSIS — I340 Nonrheumatic mitral (valve) insufficiency: Secondary | ICD-10-CM | POA: Diagnosis not present

## 2017-04-13 DIAGNOSIS — E785 Hyperlipidemia, unspecified: Secondary | ICD-10-CM | POA: Diagnosis not present

## 2017-04-13 DIAGNOSIS — I2510 Atherosclerotic heart disease of native coronary artery without angina pectoris: Secondary | ICD-10-CM | POA: Diagnosis not present

## 2017-04-20 ENCOUNTER — Encounter: Payer: Self-pay | Admitting: Student

## 2017-04-23 ENCOUNTER — Encounter: Admission: RE | Payer: Self-pay | Source: Ambulatory Visit

## 2017-04-23 ENCOUNTER — Ambulatory Visit: Admission: RE | Admit: 2017-04-23 | Payer: Medicare HMO | Source: Ambulatory Visit | Admitting: Gastroenterology

## 2017-04-23 HISTORY — DX: Calculus of kidney: N20.0

## 2017-04-23 HISTORY — DX: Gastro-esophageal reflux disease with esophagitis, without bleeding: K21.00

## 2017-04-23 HISTORY — DX: Gastro-esophageal reflux disease with esophagitis: K21.0

## 2017-04-23 HISTORY — DX: Polyp of colon: K63.5

## 2017-04-23 HISTORY — DX: Benign neoplasm of stomach: D13.1

## 2017-04-23 SURGERY — ESOPHAGOGASTRODUODENOSCOPY (EGD) WITH PROPOFOL
Anesthesia: General

## 2017-04-26 DIAGNOSIS — Z5111 Encounter for antineoplastic chemotherapy: Secondary | ICD-10-CM | POA: Diagnosis not present

## 2017-04-26 DIAGNOSIS — C61 Malignant neoplasm of prostate: Secondary | ICD-10-CM | POA: Diagnosis not present

## 2017-05-09 DIAGNOSIS — K22719 Barrett's esophagus with dysplasia, unspecified: Secondary | ICD-10-CM | POA: Diagnosis not present

## 2017-05-10 DIAGNOSIS — M6740 Ganglion, unspecified site: Secondary | ICD-10-CM | POA: Diagnosis not present

## 2017-05-11 DIAGNOSIS — K210 Gastro-esophageal reflux disease with esophagitis: Secondary | ICD-10-CM | POA: Diagnosis not present

## 2017-05-11 DIAGNOSIS — I2510 Atherosclerotic heart disease of native coronary artery without angina pectoris: Secondary | ICD-10-CM | POA: Diagnosis not present

## 2017-05-11 DIAGNOSIS — E785 Hyperlipidemia, unspecified: Secondary | ICD-10-CM | POA: Diagnosis not present

## 2017-05-11 DIAGNOSIS — G4730 Sleep apnea, unspecified: Secondary | ICD-10-CM | POA: Diagnosis not present

## 2017-05-11 DIAGNOSIS — Z9861 Coronary angioplasty status: Secondary | ICD-10-CM | POA: Diagnosis not present

## 2017-05-11 DIAGNOSIS — I10 Essential (primary) hypertension: Secondary | ICD-10-CM | POA: Diagnosis not present

## 2017-05-14 ENCOUNTER — Other Ambulatory Visit: Payer: Self-pay | Admitting: Podiatry

## 2017-05-14 ENCOUNTER — Encounter
Admission: RE | Admit: 2017-05-14 | Discharge: 2017-05-14 | Disposition: A | Discharge status: Home / Self Care | Payer: Medicare HMO | Source: Ambulatory Visit | Attending: Podiatry | Admitting: Podiatry

## 2017-05-14 ENCOUNTER — Other Ambulatory Visit: Payer: Self-pay

## 2017-05-14 DIAGNOSIS — Z01818 Encounter for other preprocedural examination: Secondary | ICD-10-CM | POA: Diagnosis not present

## 2017-05-14 DIAGNOSIS — J449 Chronic obstructive pulmonary disease, unspecified: Secondary | ICD-10-CM | POA: Insufficient documentation

## 2017-05-14 HISTORY — DX: Malignant neoplasm of bone and articular cartilage, unspecified: C41.9

## 2017-05-14 LAB — BASIC METABOLIC PANEL
Anion gap: 9 (ref 5–15)
BUN: 20 mg/dL (ref 6–20)
CHLORIDE: 106 mmol/L (ref 101–111)
CO2: 25 mmol/L (ref 22–32)
CREATININE: 1.3 mg/dL — AB (ref 0.61–1.24)
Calcium: 8.9 mg/dL (ref 8.9–10.3)
GFR calc Af Amer: 59 mL/min — ABNORMAL LOW (ref >60)
GFR calc non Af Amer: 51 mL/min — ABNORMAL LOW (ref >60)
Glucose, Bld: 103 mg/dL — ABNORMAL HIGH (ref 65–99)
POTASSIUM: 3.6 mmol/L (ref 3.5–5.1)
Sodium: 140 mmol/L (ref 135–145)

## 2017-05-14 LAB — CBC
HEMATOCRIT: 39 % — AB (ref 40.0–52.0)
Hemoglobin: 13.4 g/dL (ref 13.0–18.0)
MCH: 29.8 pg (ref 26.0–34.0)
MCHC: 34.3 g/dL (ref 32.0–36.0)
MCV: 87.1 fL (ref 80.0–100.0)
PLATELETS: 178 10*3/uL (ref 150–440)
RBC: 4.48 MIL/uL (ref 4.40–5.90)
RDW: 13.9 % (ref 11.5–14.5)
WBC: 5 10*3/uL (ref 3.8–10.6)

## 2017-05-14 NOTE — Patient Instructions (Signed)
Your procedure is scheduled on: Friday 05/18/17 Report to [place]. To find out your arrival time please call [phone] between 1PM - 3PM on Thursday 05/17/17.  Remember: Instructions that are not followed completely may result in serious medical risk, up to and including death, or upon the discretion of your surgeon and anesthesiologist your surgery may need to be rescheduled.     _X__ 1. Do not eat food after midnight the night before your procedure.                 No gum chewing or hard candies. You may drink clear liquids up to 2 hours                 before you are scheduled to arrive for your surgery- DO not drink clear                 liquids within 2 hours of the start of your surgery.                 Clear Liquids include:  water, apple juice without pulp, clear carbohydrate                 drink such as Clearfast or Gatorade, Black Coffee or Tea (Do not add                 anything to coffee or tea).  __X__2.  On the morning of surgery brush your teeth with toothpaste and water, you                 may rinse your mouth with mouthwash if you wish.  Do not swallow any              toothpaste of mouthwash.     _X__ 3.  No Alcohol for 24 hours before or after surgery.   _X__ 4.  Do Not Smoke or use e-cigarettes For 24 Hours Prior to Your Surgery.                 Do not use any chewable tobacco products for at least 6 hours prior to                 surgery.  ____  5.  Bring all medications with you on the day of surgery if instructed.   __X__  6.  Notify your doctor if there is any change in your medical condition      (cold, fever, infections).     Do not wear jewelry, make-up, hairpins, clips or nail polish. Do not wear lotions, powders, or perfumes.  Do not shave 48 hours prior to surgery. Men may shave face and neck. Do not bring valuables to the hospital.    [facility] is not responsible for any belongings or  valuables.  Contacts, dentures/partials or body piercings may not be worn into surgery. Bring a case for your contacts, glasses or hearing aids, a denture cup will be supplied. Leave your suitcase in the car. After surgery it may be brought to your room. For patients admitted to the hospital, discharge time is determined by your treatment team.   Patients discharged the day of surgery will not be allowed to drive home.   Please read over the following fact sheets that you were given:   MRSA Information  __X__ Take these medicines the morning of surgery with A SIP OF WATER:  1. Isosorbide mononitrate (imdur)  2. Pantoprazole (protonix)  3.   4.  5.  6.  ____ Fleet Enema (as directed)   __X__ Use CHG Soap/SAGE wipes as directed  __x__ Use inhalers on the day of surgery and bring to hospital  ____ Stop metformin/Janumet/Farxiga 2 days prior to surgery    ____ Take 1/2 of usual insulin dose the night before surgery. No insulin the morning          of surgery.   __X__ Stop Blood Thinners Coumadin/Plavix/Xarelto/Pleta/Pradaxa/Eliquis/Effient/Aspirin  (AS INSTRUCTED BY CARDIOLOGIST)  Or contact your Surgeon, Cardiologist or Medical Doctor regarding  ability to stop your blood thinners  __X__ Stop Anti-inflammatories 7 days before surgery such as Advil, Ibuprofen, Motrin,  BC or Goodies Powder, Naprosyn, Naproxen, Aleve, Aspirin    __X__ Stopall herbal supplements, fish oil or vitamin E until after surgery.  (OK TO CONTINUE CALCIUM VITAMIN D AND MULTIVITAMIN)  ____ Bring C-Pap to the hospital.

## 2017-05-17 MED ORDER — CEFAZOLIN SODIUM-DEXTROSE 2-4 GM/100ML-% IV SOLN
2.0000 g | INTRAVENOUS | Status: AC
  Administered 2017-05-18: 2 g via INTRAVENOUS

## 2017-05-18 ENCOUNTER — Ambulatory Visit: Payer: Medicare HMO | Admitting: Certified Registered"

## 2017-05-18 ENCOUNTER — Ambulatory Visit
Admission: RE | Admit: 2017-05-18 | Discharge: 2017-05-18 | Disposition: A | Discharge status: Home / Self Care | Payer: Medicare HMO | Source: Ambulatory Visit | Attending: Podiatry | Admitting: Podiatry

## 2017-05-18 ENCOUNTER — Other Ambulatory Visit: Payer: Self-pay

## 2017-05-18 ENCOUNTER — Encounter: Payer: Self-pay | Admitting: *Deleted

## 2017-05-18 ENCOUNTER — Encounter
Admission: RE | Disposition: A | Discharge status: Home / Self Care | Payer: Self-pay | Source: Ambulatory Visit | Attending: Podiatry

## 2017-05-18 DIAGNOSIS — Z955 Presence of coronary angioplasty implant and graft: Secondary | ICD-10-CM | POA: Insufficient documentation

## 2017-05-18 DIAGNOSIS — I10 Essential (primary) hypertension: Secondary | ICD-10-CM | POA: Insufficient documentation

## 2017-05-18 DIAGNOSIS — E119 Type 2 diabetes mellitus without complications: Secondary | ICD-10-CM | POA: Diagnosis not present

## 2017-05-18 DIAGNOSIS — Z87891 Personal history of nicotine dependence: Secondary | ICD-10-CM | POA: Diagnosis not present

## 2017-05-18 DIAGNOSIS — J449 Chronic obstructive pulmonary disease, unspecified: Secondary | ICD-10-CM | POA: Insufficient documentation

## 2017-05-18 DIAGNOSIS — Z8673 Personal history of transient ischemic attack (TIA), and cerebral infarction without residual deficits: Secondary | ICD-10-CM | POA: Diagnosis not present

## 2017-05-18 DIAGNOSIS — M67471 Ganglion, right ankle and foot: Secondary | ICD-10-CM | POA: Diagnosis not present

## 2017-05-18 DIAGNOSIS — I129 Hypertensive chronic kidney disease with stage 1 through stage 4 chronic kidney disease, or unspecified chronic kidney disease: Secondary | ICD-10-CM | POA: Diagnosis not present

## 2017-05-18 DIAGNOSIS — Z7982 Long term (current) use of aspirin: Secondary | ICD-10-CM | POA: Insufficient documentation

## 2017-05-18 DIAGNOSIS — Z888 Allergy status to other drugs, medicaments and biological substances status: Secondary | ICD-10-CM | POA: Insufficient documentation

## 2017-05-18 DIAGNOSIS — E7800 Pure hypercholesterolemia, unspecified: Secondary | ICD-10-CM | POA: Insufficient documentation

## 2017-05-18 DIAGNOSIS — Z85830 Personal history of malignant neoplasm of bone: Secondary | ICD-10-CM | POA: Insufficient documentation

## 2017-05-18 DIAGNOSIS — I2510 Atherosclerotic heart disease of native coronary artery without angina pectoris: Secondary | ICD-10-CM | POA: Insufficient documentation

## 2017-05-18 DIAGNOSIS — Z7902 Long term (current) use of antithrombotics/antiplatelets: Secondary | ICD-10-CM | POA: Insufficient documentation

## 2017-05-18 DIAGNOSIS — Z79899 Other long term (current) drug therapy: Secondary | ICD-10-CM | POA: Diagnosis not present

## 2017-05-18 DIAGNOSIS — Z8546 Personal history of malignant neoplasm of prostate: Secondary | ICD-10-CM | POA: Insufficient documentation

## 2017-05-18 DIAGNOSIS — K219 Gastro-esophageal reflux disease without esophagitis: Secondary | ICD-10-CM | POA: Insufficient documentation

## 2017-05-18 DIAGNOSIS — Z86010 Personal history of colonic polyps: Secondary | ICD-10-CM | POA: Insufficient documentation

## 2017-05-18 DIAGNOSIS — M6740 Ganglion, unspecified site: Secondary | ICD-10-CM

## 2017-05-18 DIAGNOSIS — Z8249 Family history of ischemic heart disease and other diseases of the circulatory system: Secondary | ICD-10-CM | POA: Insufficient documentation

## 2017-05-18 DIAGNOSIS — E1122 Type 2 diabetes mellitus with diabetic chronic kidney disease: Secondary | ICD-10-CM | POA: Diagnosis not present

## 2017-05-18 DIAGNOSIS — N189 Chronic kidney disease, unspecified: Secondary | ICD-10-CM | POA: Diagnosis not present

## 2017-05-18 DIAGNOSIS — Z85828 Personal history of other malignant neoplasm of skin: Secondary | ICD-10-CM | POA: Diagnosis not present

## 2017-05-18 HISTORY — PX: GANGLION CYST EXCISION: SHX1691

## 2017-05-18 LAB — GLUCOSE, CAPILLARY: Glucose-Capillary: 125 mg/dL — ABNORMAL HIGH (ref 65–99)

## 2017-05-18 SURGERY — EXCISION, GANGLION CYST, FOOT
Anesthesia: General | Site: Foot | Laterality: Right | Wound class: Clean

## 2017-05-18 MED ORDER — DEXAMETHASONE SODIUM PHOSPHATE 10 MG/ML IJ SOLN
INTRAMUSCULAR | Status: DC | PRN
  Administered 2017-05-18: 10 mg via INTRAVENOUS

## 2017-05-18 MED ORDER — SEVOFLURANE IN SOLN
RESPIRATORY_TRACT | Status: AC
  Filled 2017-05-18: qty 250

## 2017-05-18 MED ORDER — PROPOFOL 10 MG/ML IV BOLUS
INTRAVENOUS | Status: AC
  Filled 2017-05-18: qty 20

## 2017-05-18 MED ORDER — HYDROCODONE-ACETAMINOPHEN 5-325 MG PO TABS: 1.0000 | ORAL_TABLET | Freq: Four times a day (QID) | ORAL | 0 refills | Status: DC | PRN

## 2017-05-18 MED ORDER — LACTATED RINGERS IV SOLN
INTRAVENOUS | Status: DC | PRN
  Administered 2017-05-18: 07:00:00 via INTRAVENOUS

## 2017-05-18 MED ORDER — LACTATED RINGERS IV SOLN: INTRAVENOUS | Status: DC

## 2017-05-18 MED ORDER — GLYCOPYRROLATE 0.2 MG/ML IJ SOLN
INTRAMUSCULAR | Status: AC
  Filled 2017-05-18: qty 1

## 2017-05-18 MED ORDER — BUPIVACAINE HCL (PF) 0.5 % IJ SOLN
INTRAMUSCULAR | Status: DC | PRN
  Administered 2017-05-18: 10 mL

## 2017-05-18 MED ORDER — POVIDONE-IODINE 7.5 % EX SOLN
Freq: Once | CUTANEOUS | Status: DC
  Filled 2017-05-18: qty 118

## 2017-05-18 MED ORDER — LIDOCAINE HCL (CARDIAC) 20 MG/ML IV SOLN
INTRAVENOUS | Status: DC | PRN
  Administered 2017-05-18: 60 mg via INTRAVENOUS

## 2017-05-18 MED ORDER — HYDROMORPHONE HCL 1 MG/ML IJ SOLN
INTRAMUSCULAR | Status: DC | PRN
  Administered 2017-05-18 (×2): 0.5 mg via INTRAVENOUS
  Administered 2017-05-18: 1 mg via INTRAVENOUS

## 2017-05-18 MED ORDER — GLYCOPYRROLATE 0.2 MG/ML IJ SOLN
INTRAMUSCULAR | Status: DC | PRN
  Administered 2017-05-18: 0.1 mg via INTRAVENOUS

## 2017-05-18 MED ORDER — ONDANSETRON HCL 4 MG/2ML IJ SOLN
INTRAMUSCULAR | Status: DC | PRN
  Administered 2017-05-18: 4 mg via INTRAVENOUS

## 2017-05-18 MED ORDER — FENTANYL CITRATE (PF) 100 MCG/2ML IJ SOLN: 25.0000 ug | INTRAMUSCULAR | Status: DC | PRN

## 2017-05-18 MED ORDER — CEFAZOLIN SODIUM-DEXTROSE 2-4 GM/100ML-% IV SOLN
INTRAVENOUS | Status: AC
  Filled 2017-05-18: qty 100

## 2017-05-18 MED ORDER — HYDROMORPHONE HCL 1 MG/ML IJ SOLN
INTRAMUSCULAR | Status: AC
  Filled 2017-05-18: qty 1

## 2017-05-18 MED ORDER — PHENYLEPHRINE HCL 10 MG/ML IJ SOLN
INTRAMUSCULAR | Status: DC | PRN
  Administered 2017-05-18 (×5): 100 ug via INTRAVENOUS

## 2017-05-18 MED ORDER — ONDANSETRON HCL 4 MG/2ML IJ SOLN
INTRAMUSCULAR | Status: AC
  Filled 2017-05-18: qty 2

## 2017-05-18 MED ORDER — LIDOCAINE-EPINEPHRINE 1 %-1:100000 IJ SOLN
INTRAMUSCULAR | Status: AC
Start: 2017-05-18 — End: ?
  Filled 2017-05-18: qty 1

## 2017-05-18 MED ORDER — OXYCODONE HCL 5 MG PO TABS: 5.0000 mg | ORAL_TABLET | Freq: Once | ORAL | Status: DC | PRN

## 2017-05-18 MED ORDER — ACETAMINOPHEN 10 MG/ML IV SOLN
INTRAVENOUS | Status: DC | PRN
  Administered 2017-05-18: 1000 mg via INTRAVENOUS

## 2017-05-18 MED ORDER — MEPERIDINE HCL 50 MG/ML IJ SOLN: 6.2500 mg | INTRAMUSCULAR | Status: DC | PRN

## 2017-05-18 MED ORDER — EPHEDRINE SULFATE 50 MG/ML IJ SOLN
INTRAMUSCULAR | Status: DC | PRN
  Administered 2017-05-18: 5 mg via INTRAVENOUS
  Administered 2017-05-18 (×3): 10 mg via INTRAVENOUS
  Administered 2017-05-18: 5 mg via INTRAVENOUS

## 2017-05-18 MED ORDER — LIDOCAINE-EPINEPHRINE 1 %-1:100000 IJ SOLN
INTRAMUSCULAR | Status: DC | PRN
Start: 2017-05-18 — End: 2017-05-18
  Administered 2017-05-18: 10 mL

## 2017-05-18 MED ORDER — LIDOCAINE HCL (PF) 2 % IJ SOLN
INTRAMUSCULAR | Status: AC
  Filled 2017-05-18: qty 10

## 2017-05-18 MED ORDER — PROMETHAZINE HCL 25 MG/ML IJ SOLN: 6.2500 mg | INTRAMUSCULAR | Status: DC | PRN

## 2017-05-18 MED ORDER — DEXAMETHASONE SODIUM PHOSPHATE 10 MG/ML IJ SOLN
INTRAMUSCULAR | Status: AC
  Filled 2017-05-18: qty 1

## 2017-05-18 MED ORDER — EPHEDRINE SULFATE 50 MG/ML IJ SOLN
INTRAMUSCULAR | Status: AC
  Filled 2017-05-18: qty 1

## 2017-05-18 MED ORDER — BUPIVACAINE HCL (PF) 0.5 % IJ SOLN
INTRAMUSCULAR | Status: AC
  Filled 2017-05-18: qty 30

## 2017-05-18 MED ORDER — PROPOFOL 10 MG/ML IV BOLUS
INTRAVENOUS | Status: AC
  Filled 2017-05-18: qty 40

## 2017-05-18 MED ORDER — ACETAMINOPHEN NICU IV SYRINGE 10 MG/ML
INTRAVENOUS | Status: AC
Start: 2017-05-18 — End: ?
  Filled 2017-05-18: qty 1

## 2017-05-18 MED ORDER — PROPOFOL 10 MG/ML IV BOLUS
INTRAVENOUS | Status: DC | PRN
  Administered 2017-05-18: 120 mg via INTRAVENOUS

## 2017-05-18 MED ORDER — OXYCODONE HCL 5 MG/5ML PO SOLN: 5.0000 mg | Freq: Once | ORAL | Status: DC | PRN

## 2017-05-18 SURGICAL SUPPLY — 41 items
BAG COUNTER SPONGE EZ (MISCELLANEOUS) ×2 IMPLANT
BAG SPNG 4X4 CLR HAZ (MISCELLANEOUS) ×1
BANDAGE ELASTIC 4 LF NS (GAUZE/BANDAGES/DRESSINGS) ×2 IMPLANT
BLADE SURG SZ20 CARB STEEL (BLADE) ×1 IMPLANT
BNDG CMPR 75X21 PLY HI ABS (MISCELLANEOUS) ×1
BNDG CMPR MED 5X4 ELC HKLP NS (GAUZE/BANDAGES/DRESSINGS) ×1
BNDG COHESIVE 4X5 TAN STRL (GAUZE/BANDAGES/DRESSINGS) ×1 IMPLANT
BNDG CONFORM 3 STRL LF (GAUZE/BANDAGES/DRESSINGS) ×2 IMPLANT
BNDG ESMARK 4X12 TAN STRL LF (GAUZE/BANDAGES/DRESSINGS) ×2 IMPLANT
BNDG GAUZE 4.5X4.1 6PLY STRL (MISCELLANEOUS) ×2 IMPLANT
CANISTER SUCT 1200ML W/VALVE (MISCELLANEOUS) ×2 IMPLANT
CUFF TOURN 30 STER DUAL PORT (MISCELLANEOUS) ×1 IMPLANT
DURAPREP 26ML APPLICATOR (WOUND CARE) ×2 IMPLANT
ELECT REM PT RETURN 9FT ADLT (ELECTROSURGICAL) ×2
ELECTRODE REM PT RTRN 9FT ADLT (ELECTROSURGICAL) ×1 IMPLANT
GAUZE PETRO XEROFOAM 1X8 (MISCELLANEOUS) ×2 IMPLANT
GAUZE SPONGE 4X4 12PLY STRL (GAUZE/BANDAGES/DRESSINGS) ×2 IMPLANT
GAUZE STRETCH 2X75IN STRL (MISCELLANEOUS) ×2 IMPLANT
GLOVE BIO SURGEON STRL SZ7.5 (GLOVE) ×5 IMPLANT
GLOVE INDICATOR 8.0 STRL GRN (GLOVE) ×5 IMPLANT
GOWN STRL REUS W/ TWL LRG LVL3 (GOWN DISPOSABLE) ×2 IMPLANT
GOWN STRL REUS W/TWL LRG LVL3 (GOWN DISPOSABLE) ×4
LABEL OR SOLS (LABEL) ×2 IMPLANT
NDL FILTER BLUNT 18X1 1/2 (NEEDLE) ×1 IMPLANT
NDL HYPO 25X1 1.5 SAFETY (NEEDLE) ×2 IMPLANT
NEEDLE FILTER BLUNT 18X 1/2SAF (NEEDLE)
NEEDLE FILTER BLUNT 18X1 1/2 (NEEDLE) IMPLANT
NEEDLE HYPO 25X1 1.5 SAFETY (NEEDLE) IMPLANT
NS IRRIG 500ML POUR BTL (IV SOLUTION) ×2 IMPLANT
PACK EXTREMITY ARMC (MISCELLANEOUS) ×2 IMPLANT
PENCIL ELECTRO HAND CTR (MISCELLANEOUS) ×1 IMPLANT
SPONGE KITTNER 5P (MISCELLANEOUS) ×1 IMPLANT
STOCKINETTE M/LG 89821 (MISCELLANEOUS) ×2 IMPLANT
STRIP CLOSURE SKIN 1/4X4 (GAUZE/BANDAGES/DRESSINGS) ×2 IMPLANT
SUT MNCRL+ 5-0 UNDYED PC-3 (SUTURE) ×1 IMPLANT
SUT MONOCRYL 5-0 (SUTURE) ×1
SUT VIC AB 4-0 FS2 27 (SUTURE) ×2 IMPLANT
SUT VIC AB 4-0 SH 27 (SUTURE) ×2
SUT VIC AB 4-0 SH 27XANBCTRL (SUTURE) IMPLANT
SWABSTK COMLB BENZOIN TINCTURE (MISCELLANEOUS) ×2 IMPLANT
SYR 10ML LL (SYRINGE) ×2 IMPLANT

## 2017-05-18 NOTE — Progress Notes (Signed)
Ready for discharge  States no more vomiting

## 2017-05-18 NOTE — Discharge Instructions (Signed)
[facility] °[facility] ° °POST OPERATIVE INSTRUCTIONS FOR DR. TROXLER AND DR. Scott Vanderveer °[facility] ° ° °1. Take your medication as prescribed.  Pain medication should be taken only as needed. ° °2. Keep the dressing clean, dry and intact. ° °3. Keep your foot elevated above the heart level for the first 48 hours. ° °4. Walking to the bathroom and brief periods of walking are acceptable, unless we have instructed you to be non-weight bearing. ° °5. Always wear your post-op shoe when walking.  Always use your crutches if you are to be non-weight bearing. ° °6. Do not take a shower. Baths are permissible as long as the foot is kept out of the water.  ° °7. Every hour you are awake:  °- Bend your knee 15 times. °- Flex foot 15 times °- Massage calf 15 times ° °8. Call [facility] ([phone]) if any of the following problems occur: °- You develop a temperature or fever. °- The bandage becomes saturated with blood. °- Medication does not stop your pain. °- Injury of the foot occurs. °- Any symptoms of infection including redness, odor, or red streaks running from wound. °-  ° °

## 2017-05-18 NOTE — Anesthesia Post-op Follow-up Note (Signed)
Anesthesia QCDR form completed.        

## 2017-05-18 NOTE — Progress Notes (Signed)
Up to chair   Right foot elevated on pillows   Circulation positive to right foot   Can wiggle toes

## 2017-05-18 NOTE — H&P (Signed)
HISTORY AND PHYSICAL INTERVAL NOTE:  05/18/2017  7:32 AM  Nathaniel Thompson  has presented today for surgery, with the diagnosis of Ganglion cyst - Right ankle.  The various methods of treatment have been discussed with the patient.  No guarantees were given.  After consideration of risks, benefits and other options for treatment, the patient has consented to surgery.  I have reviewed the patients' chart and labs.    Patient Vitals for the past 24 hrs:  BP Temp Temp src Pulse Resp SpO2 Height Weight  05/18/17 0611 (!) 156/93 97.7 F (36.5 C) Tympanic 70 (!) 98 98 % 5\' 9"  (1.753 m) 92.5 kg (204 lb)    A history and physical examination was performed in my office.  The patient was reexamined.  There have been no changes to this history and physical examination.  Samara Deist A

## 2017-05-18 NOTE — Progress Notes (Signed)
Vomited x1  Clear liquid

## 2017-05-18 NOTE — Anesthesia Postprocedure Evaluation (Signed)
Anesthesia Post Note  Patient: Nathaniel Thompson  Procedure(s) Performed: REMOVAL GANGLION CYST ANKLE (Right Foot)  Patient location during evaluation: PACU Anesthesia Type: General Level of consciousness: awake and alert and oriented Pain management: pain level controlled Vital Signs Assessment: post-procedure vital signs reviewed and stable Respiratory status: spontaneous breathing, nonlabored ventilation and respiratory function stable Cardiovascular status: blood pressure returned to baseline and stable Postop Assessment: no signs of nausea or vomiting Anesthetic complications: no     Last Vitals:  Vitals:   05/18/17 1016 05/18/17 1027  BP: 134/85 123/75  Pulse: 77 71  Resp: 15 18  Temp:    SpO2: (!) 86% 96%    Last Pain:  Vitals:   05/18/17 0611  TempSrc: Tympanic                 Lavere Shinsky

## 2017-05-18 NOTE — Progress Notes (Signed)
Remains very sleepy   No new orders from dr penwarden

## 2017-05-18 NOTE — Anesthesia Procedure Notes (Signed)
Procedure Name: LMA Insertion Date/Time: 05/18/2017 7:39 AM Performed by: Nile Riggs, CRNA Pre-anesthesia Checklist: Patient identified, Emergency Drugs available, Suction available, Patient being monitored and Timeout performed Patient Re-evaluated:Patient Re-evaluated prior to induction Oxygen Delivery Method: Circle system utilized Preoxygenation: Pre-oxygenation with 100% oxygen Induction Type: IV induction Ventilation: Mask ventilation without difficulty LMA: LMA inserted LMA Size: 4.5 Tube type: Oral Number of attempts: 1 Placement Confirmation: positive ETCO2,  CO2 detector and breath sounds checked- equal and bilateral Tube secured with: Tape Dental Injury: Teeth and Oropharynx as per pre-operative assessment

## 2017-05-18 NOTE — Anesthesia Preprocedure Evaluation (Signed)
Anesthesia Evaluation  Patient identified by MRN, date of birth, ID band Patient awake    Reviewed: Allergy & Precautions, NPO status , Patient's Chart, lab work & pertinent test results  History of Anesthesia Complications Negative for: history of anesthetic complications  Airway Mallampati: II  TM Distance: >3 FB Neck ROM: Full    Dental  (+) Upper Dentures   Pulmonary neg sleep apnea, COPD,  COPD inhaler, former smoker,    breath sounds clear to auscultation- rhonchi (-) wheezing      Cardiovascular hypertension, Pt. on medications + CAD and + Cardiac Stents (last stent 2004, PTCA OM1, RCA stents 2003 and 2004)   Rhythm:Regular Rate:Normal - Systolic murmurs and - Diastolic murmurs    Neuro/Psych TIAnegative psych ROS   GI/Hepatic Neg liver ROS, GERD  ,  Endo/Other  diabetes  Renal/GU Renal InsufficiencyRenal disease (hx of nephrolithiasis)     Musculoskeletal negative musculoskeletal ROS (+)   Abdominal (+) + obese,   Peds  Hematology negative hematology ROS (+)   Anesthesia Other Findings Past Medical History: No date: Allergic rhinitis 10/21/13: Barrett's esophagus No date: Bone cancer ([place]) No date: Chronic kidney disease     Comment:  stones No date: Colon polyp, hyperplastic No date: COPD (chronic obstructive pulmonary disease) (HCC)     Comment:  mild COPD. former smoker No date: Coronary artery disease No date: Diabetes mellitus without complication (HCC) No date: Diverticulosis No date: Fundic gland polyps of stomach, benign No date: Gastritis No date: GERD (gastroesophageal reflux disease) No date: Hypercholesteremia No date: Hypertension No date: Prostate cancer ([place]) No date: Prostatism No date: Reflux esophagitis No date: Renal stones No date: Skin cancer     Comment:  left cheek/lesion excised No date: Skin cancer No date: Sleep apnea No date: TIA (transient ischemic attack) No  date: TIA (transient ischemic attack)   Reproductive/Obstetrics                            Anesthesia Physical Anesthesia Plan  ASA: III  Anesthesia Plan: General   Post-op Pain Management:    Induction: Intravenous  PONV Risk Score and Plan: 1 and Ondansetron  Airway Management Planned: LMA  Additional Equipment:   Intra-op Plan:   Post-operative Plan:   Informed Consent: I have reviewed the patients History and Physical, chart, labs and discussed the procedure including the risks, benefits and alternatives for the proposed anesthesia with the patient or authorized representative who has indicated his/her understanding and acceptance.   Dental advisory given  Plan Discussed with: CRNA and Anesthesiologist  Anesthesia Plan Comments:         Anesthesia Quick Evaluation

## 2017-05-18 NOTE — Transfer of Care (Signed)
Immediate Anesthesia Transfer of Care Note  Patient: Nathaniel Thompson  Procedure(s) Performed: REMOVAL GANGLION CYST ANKLE (Right Foot)  Patient Location: PACU  Anesthesia Type:General  Level of Consciousness: awake, alert , oriented and patient cooperative  Airway & Oxygen Therapy: Patient Spontanous Breathing and Patient connected to face mask oxygen  Post-op Assessment: Report given to RN, Post -op Vital signs reviewed and stable and Patient moving all extremities X 4  Post vital signs: Reviewed and stable  Last Vitals:  Vitals:   05/18/17 0611  BP: (!) 156/93  Pulse: 70  Resp: (!) 98  Temp: 36.5 C  SpO2: 98%    Last Pain:  Vitals:   05/18/17 0611  TempSrc: Tympanic         Complications: No apparent anesthesia complications

## 2017-05-18 NOTE — Op Note (Signed)
Operative note   Surgeon:Melyssa Signor Lawyer: None    Preop diagnosis: Anterior right ankle ganglionic cyst    Postop diagnosis: Same    Procedure: Excision cyst anterior right ankle    EBL: Minimal    Anesthesia:local and general    Hemostasis: Thigh tourniquet inflated to 250 mmHg for approximately 90 minutes    Specimen: Cyst wall right anterior ankle    Complications: None    Operative indications:Nathaniel Thompson is an 82 y.o. that presents today for surgical intervention.  The risks/benefits/alternatives/complications have been discussed and consent has been given.    Procedure:  Patient was brought into the OR and placed on the operating table in thesupine position. After anesthesia was obtained theright lower extremity was prepped and draped in usual sterile fashion.  After inflation of the tourniquet and sterile prep and drape attention was directed to the anterior aspect of the ankle where the large cystic lesion was noted overlying the anterolateral ankle.  A approximate 8 cm incision was then performed from above the ankle just lateral to the midline to the lateral aspect of the distal fibula.  Sharp and blunt dissection carried down through the subcu tissue.  At this time the cyst was noted.  The peroneal nerve was branching directly over top of the cystic lesion.  This was bluntly dissected and retracted throughout the procedure.  Blunt dissection carried down to the anterior ankle joint and lateral aspect overlying the fibula.  This was taken back proximal.  The cystic lesion was coming from within the tendon sheath of the extensor tendons.  At this point the cyst wall was then excised off of the tendon sheath.  This was taken down to the distal aspect of the ankle.  A small stump was noted on the very distal aspect and this was transected.  This will be sent for pathologic examination.  The cyst wall itself was noted to be approximately 7 cm x 4 cm.  Area was then  flushed with copious amounts of irrigation.  Bleeders were Bovie cauterized as appropriate.  The tendon sheath had been excised and was repaired with a 4-0 Vicryl.  Subcutaneous tissue was repaired with 4-0 Vicryl and the skin repaired with a 5-0 Monocryl undyed.  A 50-50 mixture of 0.5% bupivacaine and 1% lidocaine with epinephrine was on Friday along the incision site.  A bulky sterile dressing was applied.    Patient tolerated the procedure and anesthesia well.  Was transported from the OR to the PACU with all vital signs stable and vascular status intact. To be discharged per routine protocol.  Will follow up in approximately 1 week in the outpatient clinic.

## 2017-05-21 LAB — SURGICAL PATHOLOGY

## 2017-05-24 ENCOUNTER — Ambulatory Visit: Payer: Self-pay | Admitting: *Deleted

## 2017-05-24 ENCOUNTER — Telehealth: Payer: Self-pay | Admitting: Internal Medicine

## 2017-05-24 NOTE — Telephone Encounter (Signed)
Called in asking if it was ok to take Robaxin with Tylenol.   (Having muscle spasms in his neck that he has had problems with before).  Per [employer] they are compatible.   I advised the pt if his muscles spasms in his neck were not better in a day or two to call us back.   If one tablet does not help he can take another one per the prescription label on the bottle he read to me.  He verbalized understanding of these instructions.

## 2017-05-28 DIAGNOSIS — C61 Malignant neoplasm of prostate: Secondary | ICD-10-CM | POA: Diagnosis not present

## 2017-05-28 DIAGNOSIS — Z5111 Encounter for antineoplastic chemotherapy: Secondary | ICD-10-CM | POA: Diagnosis not present

## 2017-06-06 ENCOUNTER — Other Ambulatory Visit: Payer: Self-pay

## 2017-06-06 ENCOUNTER — Inpatient Hospital Stay: Payer: Medicare HMO

## 2017-06-06 ENCOUNTER — Encounter: Payer: Self-pay | Admitting: Internal Medicine

## 2017-06-06 ENCOUNTER — Inpatient Hospital Stay: Payer: Medicare HMO | Attending: Internal Medicine | Admitting: Internal Medicine

## 2017-06-06 VITALS — BP 126/75 | HR 71 | Temp 97.5°F | Resp 20 | Ht 69.0 in | Wt 206.4 lb

## 2017-06-06 DIAGNOSIS — Z806 Family history of leukemia: Secondary | ICD-10-CM | POA: Diagnosis not present

## 2017-06-06 DIAGNOSIS — C7951 Secondary malignant neoplasm of bone: Secondary | ICD-10-CM | POA: Diagnosis not present

## 2017-06-06 DIAGNOSIS — Z87891 Personal history of nicotine dependence: Secondary | ICD-10-CM | POA: Diagnosis not present

## 2017-06-06 DIAGNOSIS — Z801 Family history of malignant neoplasm of trachea, bronchus and lung: Secondary | ICD-10-CM | POA: Diagnosis not present

## 2017-06-06 DIAGNOSIS — E119 Type 2 diabetes mellitus without complications: Secondary | ICD-10-CM | POA: Insufficient documentation

## 2017-06-06 DIAGNOSIS — C61 Malignant neoplasm of prostate: Secondary | ICD-10-CM | POA: Diagnosis not present

## 2017-06-06 DIAGNOSIS — Z8546 Personal history of malignant neoplasm of prostate: Secondary | ICD-10-CM | POA: Diagnosis not present

## 2017-06-06 DIAGNOSIS — Z8049 Family history of malignant neoplasm of other genital organs: Secondary | ICD-10-CM | POA: Insufficient documentation

## 2017-06-06 DIAGNOSIS — Z8051 Family history of malignant neoplasm of kidney: Secondary | ICD-10-CM | POA: Insufficient documentation

## 2017-06-06 DIAGNOSIS — R42 Dizziness and giddiness: Secondary | ICD-10-CM | POA: Insufficient documentation

## 2017-06-06 DIAGNOSIS — I10 Essential (primary) hypertension: Secondary | ICD-10-CM | POA: Diagnosis not present

## 2017-06-06 DIAGNOSIS — R55 Syncope and collapse: Secondary | ICD-10-CM | POA: Diagnosis not present

## 2017-06-06 DIAGNOSIS — R232 Flushing: Secondary | ICD-10-CM | POA: Diagnosis not present

## 2017-06-06 LAB — CBC WITH DIFFERENTIAL/PLATELET
BASOS PCT: 1 %
Basophils Absolute: 0 10*3/uL (ref 0–0.1)
Eosinophils Absolute: 0.2 10*3/uL (ref 0–0.7)
Eosinophils Relative: 4 %
HEMATOCRIT: 38 % — AB (ref 40.0–52.0)
HEMOGLOBIN: 13.2 g/dL (ref 13.0–18.0)
LYMPHS ABS: 1.4 10*3/uL (ref 1.0–3.6)
Lymphocytes Relative: 25 %
MCH: 30.3 pg (ref 26.0–34.0)
MCHC: 34.7 g/dL (ref 32.0–36.0)
MCV: 87.5 fL (ref 80.0–100.0)
MONOS PCT: 12 %
Monocytes Absolute: 0.7 10*3/uL (ref 0.2–1.0)
NEUTROS ABS: 3.3 10*3/uL (ref 1.4–6.5)
NEUTROS PCT: 58 %
Platelets: 213 10*3/uL (ref 150–440)
RBC: 4.35 MIL/uL — ABNORMAL LOW (ref 4.40–5.90)
RDW: 14 % (ref 11.5–14.5)
WBC: 5.7 10*3/uL (ref 3.8–10.6)

## 2017-06-06 LAB — PSA: Prostatic Specific Antigen: 1.03 ng/mL (ref 0.00–4.00)

## 2017-06-06 MED ORDER — PREDNISONE 5 MG PO TABS: 5.0000 mg | ORAL_TABLET | Freq: Two times a day (BID) | ORAL | 3 refills | Status: DC

## 2017-06-06 MED ORDER — ABIRATERONE ACETATE 250 MG PO TABS
1000.0000 mg | ORAL_TABLET | Freq: Every day | ORAL | 4 refills | Status: DC
Start: 2017-06-06 — End: 2017-06-13

## 2017-06-06 NOTE — Progress Notes (Signed)
[place] CONSULT NOTE  Patient Care Team: Einar Pheasant, MD as PCP - General (Internal Medicine)  CHIEF COMPLAINTS/PURPOSE OF CONSULTATION:  Prostate cancer  #  Oncology History   Nov 2017- completed IM RT radiation therapy to his prostate and pelvic nodes for Gleason 7 (4+3) adenocarcinoma the prostate presenting the PSA of 7.8.  # JAN 2019- METASTATIC PROSTATE CA to Bone [low volume met on bone scan; CT- NED]; PSA ~50; March 25th 2019- Lupron 45 mg IM [Urology q 19M]  # [name], [place]]     Malignant neoplasm of prostate ([place])     HISTORY OF PRESENTING ILLNESS:  Nathaniel Thompson 82 y.o.  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  Patient denies having any bone pain.  Denies any weight loss or loss of appetite.  Appetite is good.  Denies any headaches.  No chest pain or shortness of breath or cough.  Denies any hot flashes.  Denies any back pain or fevers.  ROS: A complete 10 point review of system is done which is negative except mentioned above in history of present illness  MEDICAL HISTORY:  Past Medical History:  Diagnosis Date  . Allergic rhinitis   . Barrett's esophagus 10/21/13  . Bone cancer ([place])   . Chronic kidney disease    stones  . Colon polyp, hyperplastic   . COPD (chronic obstructive pulmonary disease) (H[facility])    mild COPD. former smoker  . Coronary artery disease   . Diabetes mellitus without complication ([place])   . Diverticulosis   . Fundic gland polyps of stomach, benign   . Gastritis   . GERD (gastroesophageal reflux disease)   . Hypercholesteremia   . Hypertension   . Prostate cancer ([place])   . Prostatism   . Reflux esophagitis   . Renal stones   . Skin cancer    left cheek/lesion excised  . Skin cancer   . Sleep apnea   . TIA (transient ischemic attack)   . TIA (transient ischemic attack)     SURGICAL HISTORY: Past Surgical History:  Procedure Laterality Date  .  CARDIAC CATHETERIZATION    . COLONOSCOPY WITH PROPOFOL N/A 09/13/2015   Procedure: COLONOSCOPY WITH PROPOFOL;  Surgeon: Lollie Sails, MD;  Location[facility] ENDOSCOPY;  Service: Endoscopy;  Laterality: N/A;  . CORONARY ANGIOPLASTY    . ESOPHAGOGASTRODUODENOSCOPY (EGD) WITH PROPOFOL N/A 09/13/2015   Procedure: ESOPHAGOGASTRODUODENOSCOPY (EGD) WITH PROPOFOL;  Surgeon: Lollie Sails, MD;  Location: [facility] ENDOSCOPY;  Service: Endoscopy;  Laterality: N/A;  . ESOPHAGOGASTRODUODENOSCOPY (EGD) WITH PROPOFOL N/A 12/28/2015   Procedure: ESOPHAGOGASTRODUODENOSCOPY (EGD) WITH PROPOFOL;  Surgeon: Lollie Sails, MD;  Location: [facility] ENDOSCOPY;  Service: Endoscopy;  Laterality: N/A;  . ESOPHAGOGASTRODUODENOSCOPY (EGD) WITH PROPOFOL N/A 06/16/2016   Procedure: ESOPHAGOGASTRODUODENOSCOPY (EGD) WITH PROPOFOL;  Surgeon: Lollie Sails, MD;  Location: [facility] ENDOSCOPY;  Service: Endoscopy;  Laterality: N/A;  . EYE SURGERY  2013   CATARACT EXTRACTION  . GANGLION CYST EXCISION Right 05/18/2017   Procedure: REMOVAL GANGLION CYST ANKLE;  Surgeon: Samara Deist, DPM;  Location: [facility] ORS;  Service: Podiatry;  Laterality: Right;  . heart cath stent    . PROSTATE BIOPSY    . stents     multiple    SOCIAL HISTORY: [place]; ;  Used to smoke;  quit 80s; no alcohol.  2 biological girls.  Social History   Socioeconomic History  . Marital status: Married    Spouse  name: Not on file  . Number of children: Not on file  . Years of education: Not on file  . Highest education level: Not on file  Occupational History  . Not on file  Social Needs  . Financial resource strain: Not on file  . Food insecurity:    Worry: Not on file    Inability: Not on file  . Transportation needs:    Medical: Not on file    Non-medical: Not on file  Tobacco Use  . Smoking status: Former Smoker    Packs/day: 1.00    Years: 35.00    Pack years: 35.00    Types: Cigarettes    Last attempt to  quit: 03/06/1986    Years since quitting: 31.2  . Smokeless tobacco: Former Systems developer    Types: Chew  Substance and Sexual Activity  . Alcohol use: No  . Drug use: No  . Sexual activity: Not Currently  Lifestyle  . Physical activity:    Days per week: Not on file    Minutes per session: Not on file  . Stress: Not on file  Relationships  . Social connections:    Talks on phone: Not on file    Gets together: Not on file    Attends religious service: Not on file    Active member of club or organization: Not on file    Attends meetings of clubs or organizations: Not on file    Relationship status: Not on file  . Intimate partner violence:    Fear of current or ex partner: Not on file    Emotionally abused: Not on file    Physically abused: Not on file    Forced sexual activity: Not on file  Other Topics Concern  . Not on file  Social History Narrative  . Not on file    FAMILY HISTORY: dad- MM; mother- lung cancer; oldest bro-th- kidney ca; brother- leukemia; sister- leukemia/ mild.  Family History  Problem Relation Age of Onset  . Cancer Mother        gastric and lung  . Cancer Father        multiple myeloma  . Stroke Father   . Cancer Sister        leukemia  . Cancer Brother        leukemia  . Cancer Brother        kidney  . Cancer Daughter        Uterine  . Cancer Other        [place] Son): Prostate    ALLERGIES:  is allergic to atorvastatin.  MEDICATIONS:  Current Outpatient Medications  Medication Sig Dispense Refill  . aspirin 81 MG tablet Take 81 mg by mouth daily.    . cephALEXin (KEFLEX) 500 MG capsule Take 1 capsule by mouth 3 (three) times daily.    [name] abiraterone acetate (ZYTIGA) 250 MG tablet Take 4 tablets (1,000 mg total) by mouth daily. Take on an empty stomach 1 hour before or 2 hours after a meal 120 tablet 4  . calcium citrate-vitamin D (CITRACAL+D) 315-200 MG-UNIT tablet Take 1 tablet by mouth daily.    . cholecalciferol (VITAMIN D) 1000  units tablet Take 1,000 Units by mouth daily.     . clopidogrel (PLAVIX) 75 MG tablet Take 1 tablet (75 mg total) by mouth daily. 90 tablet 0  . Degarelix Acetate (FIRMAGON [place]) Inject into the skin.    . fexofenadine (ALLEGRA) 180 MG tablet Take 180 mg  by mouth daily as needed for allergies or rhinitis.    . fluticasone (FLONASE) 50 MCG/ACT nasal spray Place 2 sprays into both nostrils daily as needed.     [name] HYDROcodone-acetaminophen (NORCO) 5-325 MG tablet Take 1 tablet by mouth every 6 (six) hours as needed for moderate pain. 30 tablet 0  . Ipratropium-Albuterol (COMBIVENT) 20-100 MCG/ACT AERS respimat 2 puffs qid prn 1 Inhaler 3  . isosorbide mononitrate (IMDUR) 30 MG 24 hr tablet TAKE 1 TABLET BY MOUTH ONCE DAILY 90 tablet 2  . losartan (COZAAR) 50 MG tablet Take 50 mg by mouth daily.    [name] lovastatin (MEVACOR) 40 MG tablet TAKE 2 TABLETS BY MOUTH AT BEDTIME 180 tablet 1  . Multiple Vitamin (MULTI-VITAMINS) TABS Take 1 tablet by mouth daily.     . nitroGLYCERIN (NITROSTAT) 0.4 MG SL tablet Place 0.4 mg under the tongue every 5 (five) minutes as needed for chest pain.    . pantoprazole (PROTONIX) 40 MG tablet Take 40 mg by mouth 2 (two) times daily.     . predniSONE (DELTASONE) 5 MG tablet Take 1 tablet (5 mg total) by mouth 2 (two) times daily with a meal. 60 tablet 3  . ranitidine (ZANTAC) 150 MG tablet Take 150 mg by mouth daily.     . silodosin (RAPAFLO) 4 MG CAPS capsule Take 1 capsule (4 mg total) by mouth daily with breakfast. (Patient not taking: Reported on 05/11/2017) 30 capsule 3   No current facility-administered medications for this visit.       [name]  PHYSICAL EXAMINATION: ECOG PERFORMANCE STATUS: 0 - Asymptomatic  Vitals:   06/06/17 1430  BP: 126/75  Pulse: 71  Resp: 20  Temp: (!) 97.5 F (36.4 C)   Filed Weights   06/06/17 1439  Weight: 206 lb 6.4 oz (93.6 kg)    GENERAL: Well-nourished well-developed; Alert, no distress and comfortable.   With his wife EYES: no  pallor or icterus OROPHARYNX: no thrush or ulceration; good dentition  NECK: supple, no masses felt LYMPH:  no palpable lymphadenopathy in the cervical, axillary or inguinal regions LUNGS: clear to auscultation and  No wheeze or crackles HEART/CVS: regular rate & rhythm and no murmurs; No lower extremity edema ABDOMEN: abdomen soft, non-tender and normal bowel sounds Musculoskeletal:no cyanosis of digits and no clubbing  PSYCH: alert & oriented x 3 with fluent speech NEURO: no focal motor/sensory deficits SKIN:  no rashes or significant lesions  LABORATORY DATA:  I have reviewed the data as listed Lab Results  Component Value Date   WBC 5.7 06/06/2017   HGB 13.2 06/06/2017   HCT 38.0 (L) 06/06/2017   MCV 87.5 06/06/2017   PLT 213 06/06/2017   Recent Labs    07/13/16 0802 12/24/16 1520 01/12/17 1020 05/14/17 1458  NA 140 139 139 140  K 4.4 3.9 4.6 3.6  CL 107 107 105 106  CO2 _0 GLUCOSE 106* 130* 102* 103*  BUN _1 CREATININE 1.27 1.23 1.23 1.30*  CALCIUM 9.3 9.2 9.8 8.9  GFRNONAA  --  54*  --  51*  GFRAA  --  >60  --  59*  PROT 6.4 7.1 7.0  --   ALBUMIN 3.9 4.0 4.1  --   AST 16 34 19  --   ALT _2 --   ALKPHOS 48 48 53  --   BILITOT 0.8 0.8 0.9  --   BILIDIR 0.2  --  0.2  --  RADIOGRAPHIC STUDIES: I have personally reviewed the radiological images as listed and agreed with the findings in the report. No results found.  ASSESSMENT & PLAN:   Malignant neoplasm of prostate ([place]) #Castrate sensitive-metastatic prostate cancer to the bone. Stage IV; currently on ADT [urology].   # I again discussed patient might benefit from addition of Zytiga to the ADT in an slowing down the progression-based on 2 clinical trials-stampede and latitude. Given the low volume metastatic disease in the bone/absence of visible disease on the CT scan-I would not recommend systemic therapy with docetaxel.   #I again reviewed the the potential side effects of  Zytiga including but not limited to fluid retention/hypokalemia/and also need to use steroids.  Patient seemed reluctant; I left a message for patient's urologist to discuss my recommendations  # Hot flashes- sec to Lupron; discussed regarding use of Ditropan patient declines.  # Discussed with oral pharmacist-we will initiate a prescription.  # follow up in 2 months/labs/ PSA; zytiga. Call- [phone] cell   Addendum patient's PSA- 1.03.  Patient informed of the results.   Cc; Dr.Ottlin; [place].   All questions were answered. The patient knows to call the clinic with any problems, questions or concerns.    Cammie Sickle, MD 06/14/2017 8:49 AM

## 2017-06-06 NOTE — Progress Notes (Signed)
Patient reports receiving a lupron injection approx. a week ago at [facility].

## 2017-06-06 NOTE — Assessment & Plan Note (Addendum)
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 Stage IV; currently on ADT [urology].   # I again discussed patient might benefit from addition of Zytiga to the ADT in an slowing down the progression-based on 2 clinical trials-[place] and [place]. Given the low volume metastatic disease in the bone/absence of visible disease on the CT scan-I would not recommend systemic therapy with docetaxel.   #I again reviewed the the potential side effects of Zytiga including but not limited to fluid retention/hypokalemia/and also need to use steroids.  Patient seemed reluctant; I left a message for patient's urologist to discuss my recommendations  # Hot flashes- sec to Lupron; discussed regarding use of Ditropan patient declines.  # Discussed with oral pharmacist-we will initiate a prescription.  # follow up in 2 months/labs/ PSA; zytiga. Call- [phone] cell   Addendum patient's PSA- 1.03.  Patient informed of the results.   Cc; Dr.Ottlin; [place].

## 2017-06-07 ENCOUNTER — Telehealth: Payer: Self-pay | Admitting: Pharmacy Technician

## 2017-06-07 NOTE — Telephone Encounter (Signed)
Oral Oncology Patient Advocate Encounter  Received notification from [facility] that prior authorization for Nathaniel Thompson is required.  PA submitted on CoverMyMeds Key [id number]   Status is pending  Oral Oncology Clinic will continue to follow.  Nathaniel Thompson. [name], Keller Patient [place] [phone] 06/07/2017 10:30 AM

## 2017-06-13 ENCOUNTER — Telehealth: Payer: Self-pay | Admitting: Pharmacist

## 2017-06-13 DIAGNOSIS — R0609 Other forms of dyspnea: Secondary | ICD-10-CM | POA: Diagnosis not present

## 2017-06-13 DIAGNOSIS — E785 Hyperlipidemia, unspecified: Secondary | ICD-10-CM | POA: Diagnosis not present

## 2017-06-13 DIAGNOSIS — R079 Chest pain, unspecified: Secondary | ICD-10-CM | POA: Diagnosis not present

## 2017-06-13 DIAGNOSIS — R55 Syncope and collapse: Secondary | ICD-10-CM | POA: Diagnosis not present

## 2017-06-13 DIAGNOSIS — C61 Malignant neoplasm of prostate: Secondary | ICD-10-CM

## 2017-06-13 DIAGNOSIS — Z87891 Personal history of nicotine dependence: Secondary | ICD-10-CM | POA: Diagnosis not present

## 2017-06-13 DIAGNOSIS — R0789 Other chest pain: Secondary | ICD-10-CM | POA: Diagnosis not present

## 2017-06-13 DIAGNOSIS — I2510 Atherosclerotic heart disease of native coronary artery without angina pectoris: Secondary | ICD-10-CM | POA: Diagnosis not present

## 2017-06-13 DIAGNOSIS — I10 Essential (primary) hypertension: Secondary | ICD-10-CM | POA: Diagnosis not present

## 2017-06-13 DIAGNOSIS — Z955 Presence of coronary angioplasty implant and graft: Secondary | ICD-10-CM | POA: Diagnosis not present

## 2017-06-13 DIAGNOSIS — R001 Bradycardia, unspecified: Secondary | ICD-10-CM | POA: Diagnosis not present

## 2017-06-13 DIAGNOSIS — E041 Nontoxic single thyroid nodule: Secondary | ICD-10-CM | POA: Diagnosis not present

## 2017-06-13 DIAGNOSIS — R0602 Shortness of breath: Secondary | ICD-10-CM | POA: Diagnosis not present

## 2017-06-13 MED ORDER — ABIRATERONE ACETATE 250 MG PO TABS: 1000.0000 mg | ORAL_TABLET | Freq: Every day | ORAL | 4 refills | Status: DC

## 2017-06-13 NOTE — Telephone Encounter (Signed)
Oral Oncology Pharmacist Encounter  Received new prescription for Zytiga (abiraterone) for the treatment of metastatic prostate cancer castration sensitive in conjunction with prednisone, planned duration until disease progression or unacceptable drug toxicity.  BP from 06/06/17 assessed, BP wnl. CMP is ordered to be checked at Mr. Timson next office visit. Prescription dose and frequency assessed.   Current medication list in Epic reviewed, no DDIs with Zytiga identified.  Prescription has been e-scribed to the [facility] for benefits analysis and approval.  Oral Oncology Clinic will continue to follow for insurance authorization, copayment issues, initial counseling and start date.  Darl Pikes, PharmD, BCPS Hematology/Oncology Clinical Pharmacist [facility] [phone]  06/13/2017 6:01 PM

## 2017-06-14 DIAGNOSIS — I10 Essential (primary) hypertension: Secondary | ICD-10-CM | POA: Diagnosis not present

## 2017-06-14 DIAGNOSIS — C61 Malignant neoplasm of prostate: Secondary | ICD-10-CM | POA: Diagnosis not present

## 2017-06-14 DIAGNOSIS — R001 Bradycardia, unspecified: Secondary | ICD-10-CM | POA: Diagnosis not present

## 2017-06-14 DIAGNOSIS — R55 Syncope and collapse: Secondary | ICD-10-CM | POA: Diagnosis not present

## 2017-06-14 DIAGNOSIS — I371 Nonrheumatic pulmonary valve insufficiency: Secondary | ICD-10-CM | POA: Diagnosis not present

## 2017-06-14 DIAGNOSIS — R0602 Shortness of breath: Secondary | ICD-10-CM | POA: Diagnosis not present

## 2017-06-14 DIAGNOSIS — R079 Chest pain, unspecified: Secondary | ICD-10-CM | POA: Diagnosis not present

## 2017-06-14 DIAGNOSIS — E785 Hyperlipidemia, unspecified: Secondary | ICD-10-CM | POA: Diagnosis not present

## 2017-06-14 DIAGNOSIS — I519 Heart disease, unspecified: Secondary | ICD-10-CM | POA: Diagnosis not present

## 2017-06-14 DIAGNOSIS — E041 Nontoxic single thyroid nodule: Secondary | ICD-10-CM | POA: Diagnosis not present

## 2017-06-14 DIAGNOSIS — R0609 Other forms of dyspnea: Secondary | ICD-10-CM | POA: Diagnosis not present

## 2017-06-14 DIAGNOSIS — I2510 Atherosclerotic heart disease of native coronary artery without angina pectoris: Secondary | ICD-10-CM | POA: Diagnosis not present

## 2017-06-14 DIAGNOSIS — Z87891 Personal history of nicotine dependence: Secondary | ICD-10-CM | POA: Diagnosis not present

## 2017-06-14 DIAGNOSIS — Z955 Presence of coronary angioplasty implant and graft: Secondary | ICD-10-CM | POA: Diagnosis not present

## 2017-06-14 MED ORDER — PREDNISONE 5 MG PO TABS: 5.0000 mg | ORAL_TABLET | Freq: Every day | ORAL | 3 refills | Status: DC

## 2017-06-14 NOTE — Telephone Encounter (Signed)
Oral Chemotherapy Pharmacist Encounter   Zytiga PA approved 03/04/2017 -03/05/2018  Referral number: [id number]  I will place a copy of the approval letter to be scanned into patient's chart.   Darl Pikes, PharmD, BCPS Hematology/Oncology Clinical Pharmacist [facility] [phone]  06/14/2017 11:30 AM

## 2017-06-14 NOTE — Telephone Encounter (Signed)
Oral Chemotherapy Pharmacist Encounter   Called patient to inquire about financial information for a grant application. Spoke to his wife who gave me the needed information. During the time frame the prostate grant closed and I could not long apply for the grant.  Nathaniel Thompson also informed me during the call that the patient that Nathaniel Thompson was in the hospital in [place]. He passed out when they were out of town visiting friends and they doctors in [place] currently think it was heart related. Nathaniel Thompson wanted me to share that information with Dr. Rogue Bussing.   Darl Pikes, PharmD, BCPS Hematology/Oncology Clinical Pharmacist [facility] [phone]  06/14/2017 11:12 AM

## 2017-06-15 DIAGNOSIS — R0602 Shortness of breath: Secondary | ICD-10-CM | POA: Diagnosis not present

## 2017-06-15 DIAGNOSIS — I2510 Atherosclerotic heart disease of native coronary artery without angina pectoris: Secondary | ICD-10-CM | POA: Diagnosis not present

## 2017-06-15 DIAGNOSIS — E785 Hyperlipidemia, unspecified: Secondary | ICD-10-CM | POA: Diagnosis not present

## 2017-06-15 DIAGNOSIS — I10 Essential (primary) hypertension: Secondary | ICD-10-CM | POA: Diagnosis not present

## 2017-06-15 DIAGNOSIS — R55 Syncope and collapse: Secondary | ICD-10-CM | POA: Diagnosis not present

## 2017-06-15 DIAGNOSIS — R072 Precordial pain: Secondary | ICD-10-CM | POA: Diagnosis not present

## 2017-06-15 DIAGNOSIS — G4730 Sleep apnea, unspecified: Secondary | ICD-10-CM | POA: Diagnosis not present

## 2017-06-15 DIAGNOSIS — K210 Gastro-esophageal reflux disease with esophagitis: Secondary | ICD-10-CM | POA: Diagnosis not present

## 2017-06-18 DIAGNOSIS — R072 Precordial pain: Secondary | ICD-10-CM | POA: Diagnosis not present

## 2017-06-18 DIAGNOSIS — K210 Gastro-esophageal reflux disease with esophagitis: Secondary | ICD-10-CM | POA: Diagnosis not present

## 2017-06-18 DIAGNOSIS — I2510 Atherosclerotic heart disease of native coronary artery without angina pectoris: Secondary | ICD-10-CM | POA: Diagnosis not present

## 2017-06-18 DIAGNOSIS — R0602 Shortness of breath: Secondary | ICD-10-CM | POA: Diagnosis not present

## 2017-06-18 DIAGNOSIS — E785 Hyperlipidemia, unspecified: Secondary | ICD-10-CM | POA: Diagnosis not present

## 2017-06-18 DIAGNOSIS — R55 Syncope and collapse: Secondary | ICD-10-CM | POA: Diagnosis not present

## 2017-06-18 DIAGNOSIS — I10 Essential (primary) hypertension: Secondary | ICD-10-CM | POA: Diagnosis not present

## 2017-06-18 DIAGNOSIS — G4730 Sleep apnea, unspecified: Secondary | ICD-10-CM | POA: Diagnosis not present

## 2017-06-18 MED ORDER — PREDNISONE 5 MG PO TABS: 5.0000 mg | ORAL_TABLET | Freq: Every day | ORAL | 3 refills | Status: DC

## 2017-06-18 MED FILL — ZYTIGA 250 MG TABLET: 250 | 30 days supply | Qty: 120 | Fill #0

## 2017-06-18 NOTE — Telephone Encounter (Signed)
Oral Chemotherapy Pharmacist Encounter  Successfully enrolled patient for copayment assistance funds from [place] from the prostate cancer fund.  Award amount: $4,250 Effective dates: 06/18/17 - 06/19/18 ID: [id number] Patient Responsibility: $0.00 Rx BIN: [id number] Rx PCN: [id number] Group #: [id number]  Billing information will be shared with [place]. I will place a copy of the award letter to be scanned into patient's chart.  Darl Pikes, PharmD, BCPS Hematology/Oncology Clinical Pharmacist [facility] [phone]  06/18/2017 10:44 AM

## 2017-06-18 NOTE — Telephone Encounter (Signed)
Oral Chemotherapy Pharmacist Encounter   Spoke with patient's wife and informed her of the foundation assistance through [place] for his Zytiga. We are going to proceed with filling the Zytiga through [facility], but they know not to start until they are seen in clinic by Dr. Rogue Bussing.   His wife stated that they are home from [place] now and Mr. Gopaul has a lot of follow-up scheduled with his cardiologist this week.   Additionally, Mrs. Karasik prefers to pick up the prednisone Rx locally so the prescription was rerouted to [facility].   Darl Pikes, PharmD, BCPS Hematology/Oncology Clinical Pharmacist [facility] [phone]  06/18/2017 12:45 PM

## 2017-06-18 NOTE — Telephone Encounter (Signed)
Oral Chemotherapy Pharmacist Encounter   Attempted to call patient to inform him of copay foundation award and to set him up for Zytiga/prednisone shipment. No answer, LVM.   Darl Pikes, PharmD, BCPS Hematology/Oncology Clinical Pharmacist [facility] [phone]  06/18/2017 10:53 AM

## 2017-06-19 ENCOUNTER — Telehealth: Payer: Self-pay | Admitting: Pharmacist

## 2017-06-19 NOTE — Telephone Encounter (Signed)
Oral Chemotherapy Pharmacist Encounter  Successfully enrolled patient for copayment assistance funds from [facility] [place] ([place]) from the metastatic prostate fund.  Award amount: $6,500 Effective dates: 12/21/16 - 06/20/18  Cardholder [id number] BIN: [id number] PCN: [id number] Group: [id number]  Billing information will be shared with [place]. I will place a copy of the award letter to be scanned into patient's chart.  Darl Pikes, PharmD, BCPS Hematology/Oncology Clinical Pharmacist [facility] [phone]  06/20/2017 8:32 AM

## 2017-06-20 DIAGNOSIS — E0510 Thyrotoxicosis with toxic single thyroid nodule without thyrotoxic crisis or storm: Secondary | ICD-10-CM | POA: Diagnosis not present

## 2017-06-20 DIAGNOSIS — E0511 Thyrotoxicosis with toxic single thyroid nodule with thyrotoxic crisis or storm: Secondary | ICD-10-CM | POA: Diagnosis not present

## 2017-06-22 NOTE — Telephone Encounter (Signed)
Oral Chemotherapy Pharmacist Encounter   Physician Diagnosis Verification Form received and approved by PAF.    Darl Pikes, PharmD, BCPS Hematology/Oncology Clinical Pharmacist [facility] [phone]  06/22/2017 1:47 PM

## 2017-06-25 DIAGNOSIS — I739 Peripheral vascular disease, unspecified: Secondary | ICD-10-CM | POA: Diagnosis not present

## 2017-06-25 DIAGNOSIS — Z87891 Personal history of nicotine dependence: Secondary | ICD-10-CM | POA: Diagnosis not present

## 2017-06-25 DIAGNOSIS — I10 Essential (primary) hypertension: Secondary | ICD-10-CM | POA: Diagnosis not present

## 2017-06-25 DIAGNOSIS — G4730 Sleep apnea, unspecified: Secondary | ICD-10-CM | POA: Diagnosis not present

## 2017-06-25 DIAGNOSIS — I2510 Atherosclerotic heart disease of native coronary artery without angina pectoris: Secondary | ICD-10-CM | POA: Diagnosis not present

## 2017-06-25 DIAGNOSIS — K219 Gastro-esophageal reflux disease without esophagitis: Secondary | ICD-10-CM | POA: Diagnosis not present

## 2017-06-25 DIAGNOSIS — E785 Hyperlipidemia, unspecified: Secondary | ICD-10-CM | POA: Diagnosis not present

## 2017-06-26 DIAGNOSIS — I739 Peripheral vascular disease, unspecified: Secondary | ICD-10-CM | POA: Diagnosis not present

## 2017-06-26 DIAGNOSIS — E785 Hyperlipidemia, unspecified: Secondary | ICD-10-CM | POA: Diagnosis not present

## 2017-06-26 DIAGNOSIS — Z87891 Personal history of nicotine dependence: Secondary | ICD-10-CM | POA: Diagnosis not present

## 2017-06-26 DIAGNOSIS — G4730 Sleep apnea, unspecified: Secondary | ICD-10-CM | POA: Diagnosis not present

## 2017-06-26 DIAGNOSIS — K219 Gastro-esophageal reflux disease without esophagitis: Secondary | ICD-10-CM | POA: Diagnosis not present

## 2017-06-26 DIAGNOSIS — I10 Essential (primary) hypertension: Secondary | ICD-10-CM | POA: Diagnosis not present

## 2017-06-26 DIAGNOSIS — R55 Syncope and collapse: Secondary | ICD-10-CM | POA: Diagnosis not present

## 2017-06-26 DIAGNOSIS — I2510 Atherosclerotic heart disease of native coronary artery without angina pectoris: Secondary | ICD-10-CM | POA: Diagnosis not present

## 2017-06-28 ENCOUNTER — Inpatient Hospital Stay (HOSPITAL_BASED_OUTPATIENT_CLINIC_OR_DEPARTMENT_OTHER): Payer: Medicare HMO | Admitting: Internal Medicine

## 2017-06-28 ENCOUNTER — Other Ambulatory Visit: Payer: Self-pay

## 2017-06-28 VITALS — BP 178/91 | HR 63 | Temp 97.1°F | Resp 20 | Ht 69.0 in | Wt 205.0 lb

## 2017-06-28 DIAGNOSIS — R55 Syncope and collapse: Secondary | ICD-10-CM | POA: Diagnosis not present

## 2017-06-28 DIAGNOSIS — C7951 Secondary malignant neoplasm of bone: Secondary | ICD-10-CM | POA: Diagnosis not present

## 2017-06-28 DIAGNOSIS — Z87891 Personal history of nicotine dependence: Secondary | ICD-10-CM | POA: Diagnosis not present

## 2017-06-28 DIAGNOSIS — C61 Malignant neoplasm of prostate: Secondary | ICD-10-CM | POA: Diagnosis not present

## 2017-06-28 DIAGNOSIS — E119 Type 2 diabetes mellitus without complications: Secondary | ICD-10-CM | POA: Diagnosis not present

## 2017-06-28 DIAGNOSIS — Z8049 Family history of malignant neoplasm of other genital organs: Secondary | ICD-10-CM

## 2017-06-28 DIAGNOSIS — Z806 Family history of leukemia: Secondary | ICD-10-CM

## 2017-06-28 DIAGNOSIS — Z801 Family history of malignant neoplasm of trachea, bronchus and lung: Secondary | ICD-10-CM | POA: Diagnosis not present

## 2017-06-28 DIAGNOSIS — I10 Essential (primary) hypertension: Secondary | ICD-10-CM

## 2017-06-28 DIAGNOSIS — Z85828 Personal history of other malignant neoplasm of skin: Secondary | ICD-10-CM

## 2017-06-28 DIAGNOSIS — Z8546 Personal history of malignant neoplasm of prostate: Secondary | ICD-10-CM

## 2017-06-28 DIAGNOSIS — Z8051 Family history of malignant neoplasm of kidney: Secondary | ICD-10-CM | POA: Diagnosis not present

## 2017-06-28 DIAGNOSIS — R232 Flushing: Secondary | ICD-10-CM | POA: Diagnosis not present

## 2017-06-28 DIAGNOSIS — R42 Dizziness and giddiness: Secondary | ICD-10-CM | POA: Diagnosis not present

## 2017-06-28 NOTE — Progress Notes (Signed)
Approximately 2 weeks ago from today - patient had a syncope episode when he was out of state on vacation. 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 His sytsolic bp in [place] was over 200 at the time of syncope per pt's wife. Patient denies any chest pain or shortness of breath. Patient also reports an high calcium level in the hospital. He is on calcium/vitd and would like to know if this supplement should be held.

## 2017-06-28 NOTE — Progress Notes (Signed)
[facility] CONSULT NOTE  Patient Care Team: Einar Pheasant, MD as PCP - General (Internal Medicine)  CHIEF COMPLAINTS/PURPOSE OF CONSULTATION:  Prostate cancer  #  Oncology History   Nov 2017- completed IM RT radiation therapy to his prostate and pelvic nodes for Gleason 7 (4+3) adenocarcinoma the prostate presenting the PSA of 7.8.  # JAN 2019- METASTATIC PROSTATE CA to Bone [low volume met on bone scan; CT- NED]; PSA ~50; March 25th 2019- Lupron 45 mg IM [Urology q 34M]  # [name], [place]]     Malignant neoplasm of prostate ([place])     HISTORY OF PRESENTING ILLNESS:  Nathaniel Thompson 82 y.o.  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  In the interim patient was admitted to hospital in [place] for lightheadedness/presyncope.  Work-up that has been negative.  He has been evaluated cardiology here awaiting further work-up.  He continues to have intermittent hot flashes.  Not any worse.  ROS: A complete 10 point review of system is done which is negative except mentioned above in history of present illness  MEDICAL HISTORY:  Past Medical History:  Diagnosis Date  . Allergic rhinitis   . Barrett's esophagus 10/21/13  . Bone cancer ([place])   . Chronic kidney disease    stones  . Colon polyp, hyperplastic   . COPD (chronic obstructive pulmonary disease) (HCC)    mild COPD. former smoker  . Coronary artery disease   . Diabetes mellitus without complication ([place])   . Diverticulosis   . Fundic gland polyps of stomach, benign   . Gastritis   . GERD (gastroesophageal reflux disease)   . Hypercholesteremia   . Hypertension   . Prostate cancer ([place])   . Prostatism   . Reflux esophagitis   . Renal stones   . Skin cancer    left cheek/lesion excised  . Skin cancer   . Sleep apnea   . TIA (transient ischemic attack)   . TIA (transient ischemic attack)     SURGICAL HISTORY: Past Surgical History:   Procedure Laterality Date  . CARDIAC CATHETERIZATION    . COLONOSCOPY WITH PROPOFOL N/A 09/13/2015   Procedure: COLONOSCOPY WITH PROPOFOL;  Surgeon: Lollie Sails, MD;  Location: [facility] ENDOSCOPY;  Service: Endoscopy;  Laterality: N/A;  . CORONARY ANGIOPLASTY    . ESOPHAGOGASTRODUODENOSCOPY (EGD) WITH PROPOFOL N/A 09/13/2015   Procedure: ESOPHAGOGASTRODUODENOSCOPY (EGD) WITH PROPOFOL;  Surgeon: Lollie Sails, MD;  Location: [facility] ENDOSCOPY;  Service: Endoscopy;  Laterality: N/A;  . ESOPHAGOGASTRODUODENOSCOPY (EGD) WITH PROPOFOL N/A 12/28/2015   Procedure: ESOPHAGOGASTRODUODENOSCOPY (EGD) WITH PROPOFOL;  Surgeon: Lollie Sails, MD;  Location[facility] ENDOSCOPY;  Service: Endoscopy;  Laterality: N/A;  . ESOPHAGOGASTRODUODENOSCOPY (EGD) WITH PROPOFOL N/A 06/16/2016   Procedure: ESOPHAGOGASTRODUODENOSCOPY (EGD) WITH PROPOFOL;  Surgeon: Lollie Sails, MD;  Location: [facility] ENDOSCOPY;  Service: Endoscopy;  Laterality: N/A;  . EYE SURGERY  2013   CATARACT EXTRACTION  . GANGLION CYST EXCISION Right 05/18/2017   Procedure: REMOVAL GANGLION CYST ANKLE;  Surgeon: Samara Deist, DPM;  Location: [facility] ORS;  Service: Podiatry;  Laterality: Right;  . heart cath stent    . PROSTATE BIOPSY    . stents     multiple    SOCIAL HISTORY: [place]; [place];  Used to smoke;  quit 80s; no alcohol.  2 biological girls.  Social History   Socioeconomic History  . Marital status: Married    Spouse name: Not on file  .  Number of children: Not on file  . Years of education: Not on file  . Highest education level: Not on file  Occupational History  . Not on file  Social Needs  . Financial resource strain: Not hard at all  . Food insecurity:    Worry: Never true    Inability: Never true  . Transportation needs:    Medical: No    Non-medical: No  Tobacco Use  . Smoking status: Former Smoker    Packs/day: 1.00    Years: 35.00    Pack years: 35.00    Types: Cigarettes     Last attempt to quit: 03/06/1986    Years since quitting: 31.3  . Smokeless tobacco: Former Systems developer    Types: Chew  Substance and Sexual Activity  . Alcohol use: No  . Drug use: No  . Sexual activity: Not Currently  Lifestyle  . Physical activity:    Days per week: Not on file    Minutes per session: Not on file  . Stress: Not on file  Relationships  . Social connections:    Talks on phone: Not on file    Gets together: Not on file    Attends religious service: Not on file    Active member of club or organization: Not on file    Attends meetings of clubs or organizations: Not on file    Relationship status: Not on file  . Intimate partner violence:    Fear of current or ex partner: No    Emotionally abused: No    Physically abused: No    Forced sexual activity: No  Other Topics Concern  . Not on file  Social History Narrative  . Not on file    FAMILY HISTORY: dad- MM; mother- lung cancer; oldest bro-th- kidney ca; brother- leukemia; sister- leukemia/ mild.  Family History  Problem Relation Age of Onset  . Cancer Mother        gastric and lung  . Cancer Father        multiple myeloma  . Stroke Father   . Cancer Sister        leukemia  . Cancer Brother        leukemia  . Cancer Brother        kidney  . Cancer Daughter        Uterine  . Cancer Other        [place] Son): Prostate    ALLERGIES:  is allergic to atorvastatin.  MEDICATIONS:  Current Outpatient Medications  Medication Sig Dispense Refill  . aspirin 81 MG tablet Take 81 mg by mouth daily.    . calcium citrate-vitamin D (CITRACAL+D) 315-200 MG-UNIT tablet Take 1 tablet by mouth daily.    . cholecalciferol (VITAMIN D) 1000 units tablet Take 1,000 Units by mouth daily.     . clopidogrel (PLAVIX) 75 MG tablet Take 1 tablet (75 mg total) by mouth daily. 90 tablet 0  . Degarelix Acetate (FIRMAGON [place]) Inject into the skin.    . fexofenadine (ALLEGRA) 180 MG tablet Take 180 mg by mouth daily as needed  for allergies or rhinitis.    . fluticasone (FLONASE) 50 MCG/ACT nasal spray Place 2 sprays into both nostrils daily.    . Ipratropium-Albuterol (COMBIVENT) 20-100 MCG/ACT AERS respimat 2 puffs qid prn 1 Inhaler 3  . isosorbide mononitrate (IMDUR) 30 MG 24 hr tablet TAKE 1 TABLET BY MOUTH ONCE DAILY 90 tablet 2  . losartan (COZAAR) 50 MG tablet Take  50 mg by mouth daily.    [name] lovastatin (MEVACOR) 40 MG tablet TAKE 2 TABLETS BY MOUTH AT BEDTIME 180 tablet 1  . Multiple Vitamin (MULTI-VITAMINS) TABS Take 1 tablet by mouth daily.     . pantoprazole (PROTONIX) 40 MG tablet Take 40 mg by mouth 2 (two) times daily.     . ranitidine (ZANTAC) 150 MG tablet Take 150 mg by mouth daily.     [name] abiraterone acetate (ZYTIGA) 250 MG tablet Take 4 tablets (1,000 mg total) by mouth daily. Take on an empty stomach 1 hour before or 2 hours after a meal (Patient not taking: Reported on 06/28/2017) 120 tablet 4  . metoprolol succinate (TOPROL-XL) 25 MG 24 hr tablet Take 25 mg by mouth daily.    . nitroGLYCERIN (NITROSTAT) 0.4 MG SL tablet Place 0.4 mg under the tongue every 5 (five) minutes as needed for chest pain.    . Polyethylene Glycol 3350 (MIRALAX PO) Take 1 packet by mouth.     No current facility-administered medications for this visit.       [name]  PHYSICAL EXAMINATION: ECOG PERFORMANCE STATUS: 0 - Asymptomatic  Vitals:   06/28/17 0915  BP: (!) 178/91  Pulse: 63  Resp: 20  Temp: (!) 97.1 F (36.2 C)   Filed Weights   06/28/17 0952  Weight: 205 lb (93 kg)    GENERAL: Well-nourished well-developed; Alert, no distress and comfortable.   With his wife EYES: no pallor or icterus OROPHARYNX: no thrush or ulceration; good dentition  NECK: supple, no masses felt LYMPH:  no palpable lymphadenopathy in the cervical, axillary or inguinal regions LUNGS: clear to auscultation and  No wheeze or crackles HEART/CVS: regular rate & rhythm and no murmurs; No lower extremity edema ABDOMEN: abdomen soft,  non-tender and normal bowel sounds Musculoskeletal:no cyanosis of digits and no clubbing  PSYCH: alert & oriented x 3 with fluent speech NEURO: no focal motor/sensory deficits SKIN:  no rashes or significant lesions  LABORATORY DATA:  I have reviewed the data as listed Lab Results  Component Value Date   WBC 5.7 06/06/2017   HGB 13.2 06/06/2017   HCT 38.0 (L) 06/06/2017   MCV 87.5 06/06/2017   PLT 213 06/06/2017   Recent Labs    12/24/16 1520 01/12/17 1020 05/14/17 1458 07/09/17 0801 07/10/17 0819  NA 139 139 140  --  142  K 3.9 4.6 3.6  --  4.4  CL 107 105 106  --  108  CO2 '23 28 25  ' --  21  GLUCOSE 130* 102* 103*  --  97  BUN '12 18 20  ' --  19  CREATININE 1.23 1.23 1.30*  --  1.17  CALCIUM 9.2 9.8 8.9  --  9.2  GFRNONAA 54*  --  51*  --   --   GFRAA >60  --  59*  --   --   PROT 7.1 7.0  --  6.4  --   ALBUMIN 4.0 4.1  --  3.7  --   AST 34 19  --  19  --   ALT 20 14  --  15  --   ALKPHOS 48 53  --  42  --   BILITOT 0.8 0.9  --  0.5  --   BILIDIR  --  0.2  --  0.1  --     RADIOGRAPHIC STUDIES: I have personally reviewed the radiological images as listed and agreed with the findings in the report. No results  found.  ASSESSMENT & PLAN:   Malignant neoplasm of prostate ([place]) #Castrate sensitive-metastatic prostate cancer to the bone. Stage IV; currently on ADT [urology].   #Again discussed the role of adding Zytiga to ADT-with improvement of progression free survival.  Patient reluctant to given his recent health concerns/cardiac issues [see discussion below].  Wants to hold off Zytiga at this time.  #Syncope lightheadedness-awaiting work-up with cardiology Dr. Yancey Flemings.  #Intermittent hot flashes-on Lupron stable; declines any pharmacotherapy  #Elevated blood pressures-currently improved.   # follow up in 1 m/hold Zytiga at this time-given patient's reluctance   Cc; Dr.Ottlin; [place].   All questions were answered. The patient knows to call the clinic with any  problems, questions or concerns.    Cammie Sickle, MD 07/15/2017 8:04 PM

## 2017-06-28 NOTE — Assessment & Plan Note (Addendum)
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 Stage IV; currently on ADT [urology].   #Again discussed the role of adding Zytiga to ADT-with improvement of progression free survival.  Patient reluctant to given his recent health concerns/cardiac issues [see discussion below].  Wants to hold off Zytiga at this time.  #Syncope lightheadedness-awaiting work-up with cardiology Dr. Yancey Flemings.  #Intermittent hot flashes-on Lupron stable; declines any pharmacotherapy  #Elevated blood pressures-currently improved.   # follow up in 1 m/hold Zytiga at this time-given patient's reluctance   Cc; Dr.Ottlin; [place].

## 2017-06-28 NOTE — Patient Instructions (Signed)
Please continue to hold the zytiga given current health concerns.

## 2017-06-29 DIAGNOSIS — R55 Syncope and collapse: Secondary | ICD-10-CM | POA: Diagnosis not present

## 2017-07-03 ENCOUNTER — Other Ambulatory Visit: Payer: Self-pay | Admitting: Internal Medicine

## 2017-07-03 ENCOUNTER — Telehealth: Payer: Self-pay | Admitting: Radiology

## 2017-07-03 DIAGNOSIS — E7800 Pure hypercholesterolemia, unspecified: Secondary | ICD-10-CM

## 2017-07-03 DIAGNOSIS — R739 Hyperglycemia, unspecified: Secondary | ICD-10-CM

## 2017-07-03 NOTE — Telephone Encounter (Signed)
Pt coming in for labs next Monday, please place future orders. Thank you.  

## 2017-07-03 NOTE — Telephone Encounter (Signed)
Order placed for f/u labs.  

## 2017-07-03 NOTE — Progress Notes (Signed)
Order placed for f/u labs.  

## 2017-07-05 DIAGNOSIS — E785 Hyperlipidemia, unspecified: Secondary | ICD-10-CM | POA: Diagnosis not present

## 2017-07-05 DIAGNOSIS — I10 Essential (primary) hypertension: Secondary | ICD-10-CM | POA: Diagnosis not present

## 2017-07-05 DIAGNOSIS — G4730 Sleep apnea, unspecified: Secondary | ICD-10-CM | POA: Diagnosis not present

## 2017-07-05 DIAGNOSIS — I2510 Atherosclerotic heart disease of native coronary artery without angina pectoris: Secondary | ICD-10-CM | POA: Diagnosis not present

## 2017-07-06 DIAGNOSIS — R55 Syncope and collapse: Secondary | ICD-10-CM | POA: Diagnosis not present

## 2017-07-09 ENCOUNTER — Other Ambulatory Visit (INDEPENDENT_AMBULATORY_CARE_PROVIDER_SITE_OTHER): Payer: Medicare HMO

## 2017-07-09 ENCOUNTER — Encounter: Payer: Self-pay | Admitting: Internal Medicine

## 2017-07-09 ENCOUNTER — Other Ambulatory Visit: Payer: Self-pay | Admitting: Internal Medicine

## 2017-07-09 DIAGNOSIS — E7800 Pure hypercholesterolemia, unspecified: Secondary | ICD-10-CM | POA: Diagnosis not present

## 2017-07-09 DIAGNOSIS — N189 Chronic kidney disease, unspecified: Secondary | ICD-10-CM

## 2017-07-09 DIAGNOSIS — R739 Hyperglycemia, unspecified: Secondary | ICD-10-CM

## 2017-07-09 LAB — HEMOGLOBIN A1C: HEMOGLOBIN A1C: 5.7 % (ref 4.6–6.5)

## 2017-07-09 LAB — LIPID PANEL
CHOL/HDL RATIO: 4
Cholesterol: 137 mg/dL (ref 0–200)
HDL: 33.3 mg/dL — ABNORMAL LOW (ref >39.00)
LDL Cholesterol: 85 mg/dL (ref 0–99)
NONHDL: 103.21
Triglycerides: 92 mg/dL (ref 0.0–149.0)
VLDL: 18.4 mg/dL (ref 0.0–40.0)

## 2017-07-09 LAB — HEPATIC FUNCTION PANEL
ALBUMIN: 3.7 g/dL (ref 3.5–5.2)
ALT: 15 U/L (ref 0–53)
AST: 19 U/L (ref 0–37)
Alkaline Phosphatase: 42 U/L (ref 39–117)
BILIRUBIN TOTAL: 0.5 mg/dL (ref 0.2–1.2)
Bilirubin, Direct: 0.1 mg/dL (ref 0.0–0.3)
Total Protein: 6.4 g/dL (ref 6.0–8.3)

## 2017-07-09 NOTE — Progress Notes (Signed)
Order placed for add on - met b.

## 2017-07-10 ENCOUNTER — Other Ambulatory Visit (INDEPENDENT_AMBULATORY_CARE_PROVIDER_SITE_OTHER): Payer: Medicare HMO

## 2017-07-10 ENCOUNTER — Encounter: Payer: Self-pay | Admitting: Internal Medicine

## 2017-07-10 DIAGNOSIS — N189 Chronic kidney disease, unspecified: Secondary | ICD-10-CM | POA: Diagnosis not present

## 2017-07-10 LAB — BASIC METABOLIC PANEL
BUN: 19 mg/dL (ref 6–23)
CALCIUM: 9.2 mg/dL (ref 8.4–10.5)
CO2: 21 mEq/L (ref 19–32)
Chloride: 108 mEq/L (ref 96–112)
Creatinine, Ser: 1.17 mg/dL (ref 0.40–1.50)
GFR: 63.82 mL/min (ref >60.00)
GLUCOSE: 97 mg/dL (ref 70–99)
POTASSIUM: 4.4 meq/L (ref 3.5–5.1)
SODIUM: 142 meq/L (ref 135–145)

## 2017-07-11 ENCOUNTER — Ambulatory Visit (INDEPENDENT_AMBULATORY_CARE_PROVIDER_SITE_OTHER): Payer: Medicare HMO

## 2017-07-11 ENCOUNTER — Encounter: Payer: Self-pay | Admitting: Internal Medicine

## 2017-07-11 ENCOUNTER — Ambulatory Visit (INDEPENDENT_AMBULATORY_CARE_PROVIDER_SITE_OTHER): Payer: Medicare HMO | Admitting: Internal Medicine

## 2017-07-11 VITALS — BP 148/70 | HR 62 | Temp 98.4°F | Resp 15 | Ht 67.5 in | Wt 210.4 lb

## 2017-07-11 DIAGNOSIS — N189 Chronic kidney disease, unspecified: Secondary | ICD-10-CM | POA: Diagnosis not present

## 2017-07-11 DIAGNOSIS — Z0000 Encounter for general adult medical examination without abnormal findings: Secondary | ICD-10-CM

## 2017-07-11 DIAGNOSIS — R739 Hyperglycemia, unspecified: Secondary | ICD-10-CM | POA: Diagnosis not present

## 2017-07-11 DIAGNOSIS — I779 Disorder of arteries and arterioles, unspecified: Secondary | ICD-10-CM

## 2017-07-11 DIAGNOSIS — J449 Chronic obstructive pulmonary disease, unspecified: Secondary | ICD-10-CM

## 2017-07-11 DIAGNOSIS — E7800 Pure hypercholesterolemia, unspecified: Secondary | ICD-10-CM | POA: Diagnosis not present

## 2017-07-11 DIAGNOSIS — I10 Essential (primary) hypertension: Secondary | ICD-10-CM | POA: Diagnosis not present

## 2017-07-11 DIAGNOSIS — I2510 Atherosclerotic heart disease of native coronary artery without angina pectoris: Secondary | ICD-10-CM | POA: Diagnosis not present

## 2017-07-11 DIAGNOSIS — I739 Peripheral vascular disease, unspecified: Secondary | ICD-10-CM

## 2017-07-11 NOTE — Patient Instructions (Addendum)
  Nathaniel Thompson , Thank you for taking time to come for your Medicare Wellness Visit. I appreciate your ongoing commitment to your health goals. Please review the following plan we discussed and let me know if I can assist you in the future.   These are the goals we discussed: Goals    . Healthy Lifestyle     Low sodium diet; healthy diet Exercise; stationary bike or walk  Stay hydrated       This is a list of the screening recommended for you and due dates:  Health Maintenance  Topic Date Due  . Tetanus Vaccine  12/03/1954  . Flu Shot  10/04/2017  . Pneumonia vaccines  Completed

## 2017-07-11 NOTE — Progress Notes (Addendum)
Subjective:   Nathaniel Thompson is a 82 y.o. male who presents for Medicare Annual/Initialpreventive examination.  Review of Systems:  No ROS.  Medicare Wellness Visit. Additional risk factors are reflected in the social history.  Cardiac Risk Factors include: advanced age (>64mn, >>32women);male gender;hypertension     Objective:    Vitals: BP (!) 148/70 (BP Location: Left Arm, Patient Position: Sitting, Cuff Size: Normal)   Pulse 62   Temp 98.4 F (36.9 C) (Oral)   Resp 15   Ht 5' 7.5" (1.715 m)   Wt 210 lb 6.4 oz (95.4 kg)   SpO2 96%   BMI 32.47 kg/m   Body mass index is 32.47 kg/m.  Advanced Directives 07/11/2017 06/28/2017 06/06/2017 05/18/2017 05/14/2017 04/11/2017 12/24/2016  Does Patient Have a Medical Advance Directive? Yes Yes Yes Yes Yes Yes Yes  Type of AParamedicof AJeff[place]Living will Living will;Healthcare Power of Attorney Living will;Healthcare Power of [place]Living will HA[place] VillageLiving will Living will;Healthcare Power of [place] Park[place] will  Does patient want to make changes to medical advance directive? No - Patient declined No - Patient declined No - Patient declined No - Patient declined No - Patient declined No - Patient declined -  Copy of HHertfordin Chart? No - copy requested No - copy requested No - copy requested No - copy requested No - copy requested No - copy requested -  Would patient like information on creating a medical advance directive? - - - - - - -    Tobacco Social History   Tobacco Use  Smoking Status Former Smoker  . Packs/day: 1.00  . Years: 35.00  . Pack years: 35.00  . Types: Cigarettes  . Last attempt to quit: 03/06/1986  . Years since quitting: 31.3  Smokeless Tobacco Former USystems developer . Types: Chew     Counseling given: Not Answered   Clinical Intake:  Pre-visit preparation completed: Yes  Pain :  No/denies pain     Nutritional Status: BMI > 30  Obese Diabetes: Yes(Followed by PCP)  How often do you need to have someone help you when you read instructions, pamphlets, or other written materials from your doctor or pharmacy?: 1 - Never  Interpreter Needed?: No     Past Medical History:  Diagnosis Date  . Allergic rhinitis   . Barrett's esophagus 10/21/13  . Bone cancer ([place]   . Chronic kidney disease    stones  . Colon polyp, hyperplastic   . COPD (chronic obstructive pulmonary disease) (HCC)    mild COPD. former smoker  . Coronary artery disease   . Diabetes mellitus without complication ([place]   . Diverticulosis   . Fundic gland polyps of stomach, benign   . Gastritis   . GERD (gastroesophageal reflux disease)   . Hypercholesteremia   . Hypertension   . Prostate cancer ([place]   . Prostatism   . Reflux esophagitis   . Renal stones   . Skin cancer    left cheek/lesion excised  . Skin cancer   . Sleep apnea   . TIA (transient ischemic attack)   . TIA (transient ischemic attack)    Past Surgical History:  Procedure Laterality Date  . CARDIAC CATHETERIZATION    . COLONOSCOPY WITH PROPOFOL N/A 09/13/2015   Procedure: COLONOSCOPY WITH PROPOFOL;  Surgeon: MLollie Sails MD;  [facility];  Service: Endoscopy;  Laterality: N/A;  . CORONARY  ANGIOPLASTY    . ESOPHAGOGASTRODUODENOSCOPY (EGD) WITH PROPOFOL N/A 09/13/2015   Procedure: ESOPHAGOGASTRODUODENOSCOPY (EGD) WITH PROPOFOL;  Surgeon: Lollie Sails, MD;  Location: [facility] ENDOSCOPY;  Service: Endoscopy;  Laterality: N/A;  . ESOPHAGOGASTRODUODENOSCOPY (EGD) WITH PROPOFOL N/A 12/28/2015   Procedure: ESOPHAGOGASTRODUODENOSCOPY (EGD) WITH PROPOFOL;  Surgeon: Lollie Sails, MD;  Location: [facility] ENDOSCOPY;  Service: Endoscopy;  Laterality: N/A;  . ESOPHAGOGASTRODUODENOSCOPY (EGD) WITH PROPOFOL N/A 06/16/2016   Procedure: ESOPHAGOGASTRODUODENOSCOPY (EGD) WITH PROPOFOL;  Surgeon: Lollie Sails, MD;   Location: [facility] ENDOSCOPY;  Service: Endoscopy;  Laterality: N/A;  . EYE SURGERY  2013   CATARACT EXTRACTION  . GANGLION CYST EXCISION Right 05/18/2017   Procedure: REMOVAL GANGLION CYST ANKLE;  Surgeon: Samara Deist, DPM;  Location: [facility] ORS;  Service: Podiatry;  Laterality: Right;  . heart cath stent    . PROSTATE BIOPSY    . stents     multiple   Family History  Problem Relation Age of Onset  . Cancer Mother        gastric and lung  . Cancer Father        multiple myeloma  . Stroke Father   . Cancer Sister        leukemia  . Cancer Brother        leukemia  . Cancer Brother        kidney  . Cancer Daughter        Uterine  . Cancer Other        [place] (Sister's Son): Prostate   Social History   Socioeconomic History  . Marital status: Married    Spouse name: Not on file  . Number of children: Not on file  . Years of education: Not on file  . Highest education level: Not on file  Occupational History  . Not on file  Social Needs  . Financial resource strain: Not hard at all  . Food insecurity:    Worry: Never true    Inability: Never true  . Transportation needs:    Medical: No    Non-medical: No  Tobacco Use  . Smoking status: Former Smoker    Packs/day: 1.00    Years: 35.00    Pack years: 35.00    Types: Cigarettes    Last attempt to quit: 03/06/1986    Years since quitting: 31.3  . Smokeless tobacco: Former Systems developer    Types: Chew  Substance and Sexual Activity  . Alcohol use: No  . Drug use: No  . Sexual activity: Not Currently  Lifestyle  . Physical activity:    Days per week: Not on file    Minutes per session: Not on file  . Stress: Not on file  Relationships  . Social connections:    Talks on phone: Not on file    Gets together: Not on file    Attends religious service: Not on file    Active member of club or organization: Not on file    Attends meetings of clubs or organizations: Not on file    Relationship status: Not on file  Other Topics  Concern  . Not on file  Social History Narrative  . Not on file    Outpatient Encounter Medications as of 07/11/2017  Medication Sig  . aspirin 81 MG tablet Take 81 mg by mouth daily.  . calcium citrate-vitamin D (CITRACAL+D) 315-200 MG-UNIT tablet Take 1 tablet by mouth daily.  . cholecalciferol (VITAMIN D) 1000 units tablet Take 1,000 Units by mouth daily.   [name]  clopidogrel (PLAVIX) 75 MG tablet Take 1 tablet (75 mg total) by mouth daily.  . Degarelix Acetate (FIRMAGON [place]) Inject into the skin.  . fexofenadine (ALLEGRA) 180 MG tablet Take 180 mg by mouth daily as needed for allergies or rhinitis.  . fluticasone (FLONASE) 50 MCG/ACT nasal spray Place 2 sprays into both nostrils daily.  . Ipratropium-Albuterol (COMBIVENT) 20-100 MCG/ACT AERS respimat 2 puffs qid prn  . isosorbide mononitrate (IMDUR) 30 MG 24 hr tablet TAKE 1 TABLET BY MOUTH ONCE DAILY  . losartan (COZAAR) 50 MG tablet Take 50 mg by mouth daily.  [name] lovastatin (MEVACOR) 40 MG tablet TAKE 2 TABLETS BY MOUTH AT BEDTIME  . metoprolol succinate (TOPROL-XL) 25 MG 24 hr tablet Take 25 mg by mouth daily.  . Multiple Vitamin (MULTI-VITAMINS) TABS Take 1 tablet by mouth daily.   . nitroGLYCERIN (NITROSTAT) 0.4 MG SL tablet Place 0.4 mg under the tongue every 5 (five) minutes as needed for chest pain.  . pantoprazole (PROTONIX) 40 MG tablet Take 40 mg by mouth 2 (two) times daily.   . Polyethylene Glycol 3350 (MIRALAX PO) Take 1 packet by mouth.  . ranitidine (ZANTAC) 150 MG tablet Take 150 mg by mouth daily.   [name] abiraterone acetate (ZYTIGA) 250 MG tablet Take 4 tablets (1,000 mg total) by mouth daily. Take on an empty stomach 1 hour before or 2 hours after a meal (Patient not taking: Reported on 06/28/2017)  . [DISCONTINUED] predniSONE (DELTASONE) 5 MG tablet Take 1 tablet (5 mg total) by mouth daily with breakfast. (Patient not taking: Reported on 06/28/2017)   No facility-administered encounter medications on file as of 07/11/2017.      Activities of Daily Living In your present state of health, do you have any difficulty performing the following activities: 07/11/2017 05/14/2017  Hearing? Y -  Comment Hearing aids -  Vision? N -  Difficulty concentrating or making decisions? N -  Walking or climbing stairs? Y N  Comment SOB on exertion -  Dressing or bathing? N -  Doing errands, shopping? N N  Preparing Food and eating ? N -  Using the Toilet? N -  In the past six months, have you accidently leaked urine? N -  Do you have problems with loss of bowel control? N -  Managing your Medications? N -  Managing your Finances? N -  Housekeeping or managing your Housekeeping? N -  Some recent data might be hidden    Patient Care Team: Einar Pheasant, MD as PCP - General (Internal Medicine)   Assessment:   This is a routine wellness examination for [place].  The goal of the wellness visit is to assist the patient how to close the gaps in care and create a preventative care plan for the patient.   The roster of all physicians providing medical care to patient is listed in the Snapshot section of the chart.  Taking calcium VIT D as appropriate/Osteoporosis risk reviewed.    Safety issues reviewed; Smoke and carbon monoxide detectors in the home. No firearms in the home. Wears seatbelts when driving or riding with others. No violence in the home.  They do not have excessive sun exposure.  Discussed the need for sun protection: hats, long sleeves and the use of sunscreen if there is significant sun exposure.  Patient is alert, normal appearance, oriented to person/place/and time.  Correctly identified the president of the [place] and recalls of 3/3 words. Performs simple calculations and can read correct time from watch  face. Displays appropriate judgement.  No new identified risk were noted.  No failures at ADL's or IADL's.    BMI- discussed the importance of a healthy diet, water intake and the benefits of aerobic  exercise. Educational material provided.   24 hour diet recall: Regular diet  Dental- upper dental plate.  Visits as needed.   Eye- Visual acuity not assessed per patient preference since they have regular follow up with the ophthalmologist.  Wears corrective lenses.  TDAP vaccine deferred per patient preference.  Follow up with insurance.  Educational material provided.  Exercise Activities and Dietary recommendations Current Exercise Habits: Home exercise routine, Type of exercise: walking;calisthenics(stationary bike), Time (Minutes): 10, Frequency (Times/Week): 3, Weekly Exercise (Minutes/Week): 30, Intensity: Mild  Goals    . Healthy Lifestyle     Low sodium diet; healthy diet Exercise; stationary bike or walk  Stay hydrated       Fall Risk Fall Risk  07/11/2017 08/07/2016 04/13/2016 11/15/2015  Falls in the past year? Yes No No No  Number falls in past yr: 1 - - -  Injury with Fall? No - - -  Comment Minor bruising. Observation stay at the hospital. - - -  Follow up Education provided - - -   Depression Screen PHQ 2/9 Scores 07/11/2017 08/07/2016 04/13/2016 11/15/2015  PHQ - 2 Score 0 0 0 0    Cognitive Function     6CIT Screen 07/11/2017  What Year? 0 points  What month? 0 points  What time? 0 points  Count back from 20 0 points  Months in reverse 0 points  Repeat phrase 0 points  Total Score 0    Immunization History  Administered Date(s) Administered  . Influenza, High Dose Seasonal PF 11/28/2016  . Influenza-Unspecified 12/05/2011, 12/05/2015  . Pneumococcal Conjugate-13 07/12/2016  . Pneumococcal Polysaccharide-23 03/06/2006   Screening Tests Health Maintenance  Topic Date Due  . TETANUS/TDAP  12/03/1954  . INFLUENZA VACCINE  10/04/2017  . PNA vac Low Risk Adult  Completed      Plan:    End of life planning; Advance aging; Advanced directives discussed. Copy of current HCPOA/Living Will requested.    I have personally reviewed and noted the following in  the patient's chart:   . Medical and social history . Use of alcohol, tobacco or illicit drugs  . Current medications and supplements . Functional ability and status . Nutritional status . Physical activity . Advanced directives . List of other physicians . Hospitalizations, surgeries, and ER visits in previous 12 months . Vitals . Screenings to include cognitive, depression, and falls . Referrals and appointments  In addition, I have reviewed and discussed with patient certain preventive protocols, quality metrics, and best practice recommendations. A written personalized care plan for preventive services as well as general preventive health recommendations were provided to patient.     Varney Biles, LPN  4/0/8144   Reviewed above information.  Pt was seen after this visit to address concerns regarding sob, etc.  Agree with assessment and plan.    Dr Nicki Reaper

## 2017-07-11 NOTE — Progress Notes (Signed)
Patient ID: Nathaniel Thompson, male   DOB: [birth year], 82 y.o.   MRN: [id number]   Subjective:    Patient ID: Nathaniel Thompson, male    DOB: 04/14/1935, 82 y.o.   MRN: [id number]  HPI  Patient here for a scheduled physical.  Given his multiple concerns and issues, we decided not to do his physical today.  He repors that approximately one month ago, he was loading the car and noticed he was more sob.  Had to sit down.  When he stood up, states he walked about 10 feet and started feeling light headed.  Went out "momentarily".  Blood pressure 200/104.  To ER at [facility].  Was admitted.  Had stress test and echo that he reports was ok.  Was discharged and drove back to [place].  After home, had two episodes of swimmy headed feeling.  Saw Dr Chancy Milroy.  Had CT brain, heart and carotid test.  Wore heart monitor.  Unsure of results of monitor, but states the remainder of the test were ok.  States last week, had two episodes.  Blood pressure ok.  Some previous congestion.  On allegra and using nasal srpay.  Has appt with ENT tomorrow.  No headache.  No chest pain.  Still reports some sob with exertion.  No acid reflux.  No abdominal pain.  Bowels moving.    Past Medical History:  Diagnosis Date  . Allergic rhinitis   . Barrett's esophagus 10/21/13  . Bone cancer ([place])   . Chronic kidney disease    stones  . Colon polyp, hyperplastic   . COPD (chronic obstructive pulmonary disease) (HCC)    mild COPD. former smoker  . Coronary artery disease   . Diabetes mellitus without complication ([place])   . Diverticulosis   . Fundic gland polyps of stomach, benign   . Gastritis   . GERD (gastroesophageal reflux disease)   . Hypercholesteremia   . Hypertension   . Prostate cancer ([place])   . Prostatism   . Reflux esophagitis   . Renal stones   . Skin cancer    left cheek/lesion excised  . Skin cancer   . Sleep apnea   . TIA (transient ischemic attack)   . TIA (transient ischemic attack)    Past  Surgical History:  Procedure Laterality Date  . CARDIAC CATHETERIZATION    . COLONOSCOPY WITH PROPOFOL N/A 09/13/2015   Procedure: COLONOSCOPY WITH PROPOFOL;  Surgeon: Lollie Sails, MD;  Location: [facility] ENDOSCOPY;  Service: Endoscopy;  Laterality: N/A;  . CORONARY ANGIOPLASTY    . ESOPHAGOGASTRODUODENOSCOPY (EGD) WITH PROPOFOL N/A 09/13/2015   Procedure: ESOPHAGOGASTRODUODENOSCOPY (EGD) WITH PROPOFOL;  Surgeon: Lollie Sails, MD;  Location: [facility] ENDOSCOPY;  Service: Endoscopy;  Laterality: N/A;  . ESOPHAGOGASTRODUODENOSCOPY (EGD) WITH PROPOFOL N/A 12/28/2015   Procedure: ESOPHAGOGASTRODUODENOSCOPY (EGD) WITH PROPOFOL;  Surgeon: Lollie Sails, MD;  Location: [facility] ENDOSCOPY;  Service: Endoscopy;  Laterality: N/A;  . ESOPHAGOGASTRODUODENOSCOPY (EGD) WITH PROPOFOL N/A 06/16/2016   Procedure: ESOPHAGOGASTRODUODENOSCOPY (EGD) WITH PROPOFOL;  Surgeon: Lollie Sails, MD;  Location: [facility] ENDOSCOPY;  Service: Endoscopy;  Laterality: N/A;  . EYE SURGERY  2013   CATARACT EXTRACTION  . GANGLION CYST EXCISION Right 05/18/2017   Procedure: REMOVAL GANGLION CYST ANKLE;  Surgeon: Samara Deist, DPM;  Location: [facility] ORS;  Service: Podiatry;  Laterality: Right;  . heart cath stent    . PROSTATE BIOPSY    . stents     multiple   Family History  Problem Relation  Age of Onset  . Cancer Mother        gastric and lung  . Cancer Father        multiple myeloma  . Stroke Father   . Cancer Sister        leukemia  . Cancer Brother        leukemia  . Cancer Brother        kidney  . Cancer Daughter        Uterine  . Cancer Other        Nephes (Sister's Son): Prostate   Social History   Socioeconomic History  . Marital status: Married    Spouse name: Not on file  . Number of children: Not on file  . Years of education: Not on file  . Highest education level: Not on file  Occupational History  . Not on file  Social Needs  . Financial resource strain: Not hard at all  . Food insecurity:     Worry: Never true    Inability: Never true  . Transportation needs:    Medical: No    Non-medical: No  Tobacco Use  . Smoking status: Former Smoker    Packs/day: 1.00    Years: 35.00    Pack years: 35.00    Types: Cigarettes    Last attempt to quit: 03/06/1986    Years since quitting: 31.3  . Smokeless tobacco: Former Systems developer    Types: Chew  Substance and Sexual Activity  . Alcohol use: No  . Drug use: No  . Sexual activity: Not Currently  Lifestyle  . Physical activity:    Days per week: Not on file    Minutes per session: Not on file  . Stress: Not on file  Relationships  . Social connections:    Talks on phone: Not on file    Gets together: Not on file    Attends religious service: Not on file    Active member of club or organization: Not on file    Attends meetings of clubs or organizations: Not on file    Relationship status: Not on file  Other Topics Concern  . Not on file  Social History Narrative  . Not on file    Outpatient Encounter Medications as of 07/11/2017  Medication Sig  . abiraterone acetate (ZYTIGA) 250 MG tablet Take 4 tablets (1,000 mg total) by mouth daily. Take on an empty stomach 1 hour before or 2 hours after a meal (Patient not taking: Reported on 06/28/2017)  . aspirin 81 MG tablet Take 81 mg by mouth daily.  . calcium citrate-vitamin D (CITRACAL+D) 315-200 MG-UNIT tablet Take 1 tablet by mouth daily.  . cholecalciferol (VITAMIN D) 1000 units tablet Take 1,000 Units by mouth daily.   . clopidogrel (PLAVIX) 75 MG tablet Take 1 tablet (75 mg total) by mouth daily.  . Degarelix Acetate (FIRMAGON [place]) Inject into the skin.  . fexofenadine (ALLEGRA) 180 MG tablet Take 180 mg by mouth daily as needed for allergies or rhinitis.  . fluticasone (FLONASE) 50 MCG/ACT nasal spray Place 2 sprays into both nostrils daily.  . Ipratropium-Albuterol (COMBIVENT) 20-100 MCG/ACT AERS respimat 2 puffs qid prn  . isosorbide mononitrate (IMDUR) 30 MG 24 hr tablet TAKE  1 TABLET BY MOUTH ONCE DAILY  . losartan (COZAAR) 50 MG tablet Take 50 mg by mouth daily.  [name] lovastatin (MEVACOR) 40 MG tablet TAKE 2 TABLETS BY MOUTH AT BEDTIME  . metoprolol succinate (TOPROL-XL) 25 MG 24 hr  tablet Take 25 mg by mouth daily.  . Multiple Vitamin (MULTI-VITAMINS) TABS Take 1 tablet by mouth daily.   . nitroGLYCERIN (NITROSTAT) 0.4 MG SL tablet Place 0.4 mg under the tongue every 5 (five) minutes as needed for chest pain.  . pantoprazole (PROTONIX) 40 MG tablet Take 40 mg by mouth 2 (two) times daily.   . ranitidine (ZANTAC) 150 MG tablet Take 150 mg by mouth daily.    No facility-administered encounter medications on file as of 07/11/2017.     Review of Systems  Constitutional: Negative for appetite change and unexpected weight change.  HENT: Positive for congestion. Negative for sinus pressure.   Respiratory: Positive for shortness of breath. Negative for cough and chest tightness.   Cardiovascular: Negative for chest pain, palpitations and leg swelling.  Gastrointestinal: Negative for abdominal pain, diarrhea, nausea and vomiting.  Genitourinary: Negative for difficulty urinating and dysuria.  Musculoskeletal: Negative for joint swelling and myalgias.  Skin: Negative for color change and rash.  Neurological: Negative for dizziness, light-headedness and headaches.  Psychiatric/Behavioral: Negative for agitation and dysphoric mood.       Objective:     Blood pressure rechecked by me:  136/78  Physical Exam  Constitutional: He appears well-developed and well-nourished. No distress.  HENT:  Nose: Nose normal.  Mouth/Throat: Oropharynx is clear and moist.  Neck: Neck supple.  Cardiovascular: Normal rate and regular rhythm.  Pulmonary/Chest: Effort normal and breath sounds normal. No respiratory distress.  Abdominal: Soft. Bowel sounds are normal. There is no tenderness.  Musculoskeletal: He exhibits no edema or tenderness.  Lymphadenopathy:    He has no cervical  adenopathy.  Skin: No rash noted. No erythema.  Psychiatric: He has a normal mood and affect. His behavior is normal.    BP 136/78   Pulse 62   Temp 98.4 F (36.9 C) (Oral)   Resp 18   Wt 210 lb 6.4 oz (95.4 kg)   SpO2 96%   BMI 31.07 kg/m  Wt Readings from Last 3 Encounters:  07/11/17 210 lb 6.4 oz (95.4 kg)  07/11/17 210 lb 6.4 oz (95.4 kg)  06/28/17 205 lb (93 kg)     Lab Results  Component Value Date   WBC 5.7 06/06/2017   HGB 13.2 06/06/2017   HCT 38.0 (L) 06/06/2017   PLT 213 06/06/2017   GLUCOSE 97 07/10/2017   CHOL 137 07/09/2017   TRIG 92.0 07/09/2017   HDL 33.30 (L) 07/09/2017   LDLCALC 85 07/09/2017   ALT 15 07/09/2017   AST 19 07/09/2017   NA 142 07/10/2017   K 4.4 07/10/2017   CL 108 07/10/2017   CREATININE 1.17 07/10/2017   BUN 19 07/10/2017   CO2 21 07/10/2017   TSH 1.76 07/13/2016   PSA 5.47 (H) 08/03/2016   HGBA1C 5.7 07/09/2017       Assessment & Plan:   Problem List Items Addressed This Visit    CAD (coronary artery disease)    Has known CAD.  S/p stent placement.  Followed by Dr Chancy Milroy.  Just had stress test, echo, CT heart, etc.  Needs results.  Still with sob and light headedness.  Just wore monitor.  Waiting for results.  Has appt with ENT tomorrow.        Carotid artery disease ([place])    States just had carotid testing.  Obtain results.  Reports ok.        CKD (chronic kidney disease)    Follow metabolic panel.  Avoid antiinflammatories.  COPD (chronic obstructive pulmonary disease) (HCC)    With sob with exertion.  W/up in progress as outlined.  Obtain results.  May need pulmonary reevaluation.        Hypercholesterolemia    On lovastatin.  Low cholesterol diet and exercise.  Follow lipid panel and liver function tests.        Hyperglycemia    Low carb diet and exercise.  Follow met b and a1c.        Hypertension, essential    Blood pressure on recheck ok.  Same medication regimen.  Follow pressures.  Follow  metabolic panel.            Einar Pheasant, MD

## 2017-07-12 DIAGNOSIS — I6350 Cerebral infarction due to unspecified occlusion or stenosis of unspecified cerebral artery: Secondary | ICD-10-CM | POA: Diagnosis not present

## 2017-07-12 DIAGNOSIS — R42 Dizziness and giddiness: Secondary | ICD-10-CM | POA: Diagnosis not present

## 2017-07-12 DIAGNOSIS — I10 Essential (primary) hypertension: Secondary | ICD-10-CM | POA: Diagnosis not present

## 2017-07-12 DIAGNOSIS — H903 Sensorineural hearing loss, bilateral: Secondary | ICD-10-CM | POA: Diagnosis not present

## 2017-07-14 ENCOUNTER — Encounter: Payer: Self-pay | Admitting: Internal Medicine

## 2017-07-14 NOTE — Assessment & Plan Note (Signed)
With sob with exertion.  W/up in progress as outlined.  Obtain results.  May need pulmonary reevaluation.

## 2017-07-14 NOTE — Assessment & Plan Note (Signed)
Follow metabolic panel.  Avoid antiinflammatories.   

## 2017-07-14 NOTE — Assessment & Plan Note (Signed)
Has known CAD.  S/p stent placement.  Followed by Dr Chancy Milroy.  Just had stress test, echo, CT heart, etc.  Needs results.  Still with sob and light headedness.  Just wore monitor.  Waiting for results.  Has appt with ENT tomorrow.

## 2017-07-14 NOTE — Assessment & Plan Note (Signed)
Blood pressure on recheck ok.  Same medication regimen.  Follow pressures.  Follow metabolic panel.

## 2017-07-14 NOTE — Assessment & Plan Note (Signed)
States just had carotid testing.  Obtain results.  Reports ok.

## 2017-07-14 NOTE — Assessment & Plan Note (Signed)
On lovastatin.  Low cholesterol diet and exercise.  Follow lipid panel and liver function tests.   

## 2017-07-14 NOTE — Assessment & Plan Note (Signed)
Low carb diet and exercise.  Follow met b and a1c.   

## 2017-07-19 DIAGNOSIS — G4730 Sleep apnea, unspecified: Secondary | ICD-10-CM | POA: Diagnosis not present

## 2017-07-19 DIAGNOSIS — E785 Hyperlipidemia, unspecified: Secondary | ICD-10-CM | POA: Diagnosis not present

## 2017-07-19 DIAGNOSIS — I10 Essential (primary) hypertension: Secondary | ICD-10-CM | POA: Diagnosis not present

## 2017-07-19 DIAGNOSIS — K210 Gastro-esophageal reflux disease with esophagitis: Secondary | ICD-10-CM | POA: Diagnosis not present

## 2017-07-19 DIAGNOSIS — R55 Syncope and collapse: Secondary | ICD-10-CM | POA: Diagnosis not present

## 2017-07-19 DIAGNOSIS — I2510 Atherosclerotic heart disease of native coronary artery without angina pectoris: Secondary | ICD-10-CM | POA: Diagnosis not present

## 2017-07-26 ENCOUNTER — Encounter: Payer: Self-pay | Admitting: Internal Medicine

## 2017-07-26 ENCOUNTER — Inpatient Hospital Stay: Payer: Medicare HMO | Attending: Internal Medicine

## 2017-07-26 ENCOUNTER — Inpatient Hospital Stay (HOSPITAL_BASED_OUTPATIENT_CLINIC_OR_DEPARTMENT_OTHER): Payer: Medicare HMO | Admitting: Internal Medicine

## 2017-07-26 VITALS — BP 145/72 | HR 53 | Temp 97.8°F | Resp 18 | Wt 209.6 lb

## 2017-07-26 DIAGNOSIS — Z800 Family history of malignant neoplasm of digestive organs: Secondary | ICD-10-CM | POA: Insufficient documentation

## 2017-07-26 DIAGNOSIS — R55 Syncope and collapse: Secondary | ICD-10-CM

## 2017-07-26 DIAGNOSIS — Z806 Family history of leukemia: Secondary | ICD-10-CM

## 2017-07-26 DIAGNOSIS — I10 Essential (primary) hypertension: Secondary | ICD-10-CM

## 2017-07-26 DIAGNOSIS — Z87891 Personal history of nicotine dependence: Secondary | ICD-10-CM | POA: Insufficient documentation

## 2017-07-26 DIAGNOSIS — Z807 Family history of other malignant neoplasms of lymphoid, hematopoietic and related tissues: Secondary | ICD-10-CM

## 2017-07-26 DIAGNOSIS — Z8051 Family history of malignant neoplasm of kidney: Secondary | ICD-10-CM | POA: Insufficient documentation

## 2017-07-26 DIAGNOSIS — C7951 Secondary malignant neoplasm of bone: Secondary | ICD-10-CM | POA: Diagnosis not present

## 2017-07-26 DIAGNOSIS — C61 Malignant neoplasm of prostate: Secondary | ICD-10-CM | POA: Diagnosis not present

## 2017-07-26 DIAGNOSIS — Z801 Family history of malignant neoplasm of trachea, bronchus and lung: Secondary | ICD-10-CM | POA: Diagnosis not present

## 2017-07-26 LAB — CBC WITH DIFFERENTIAL/PLATELET
BASOS PCT: 1 %
Basophils Absolute: 0 10*3/uL (ref 0–0.1)
Eosinophils Absolute: 0.2 10*3/uL (ref 0–0.7)
Eosinophils Relative: 5 %
HEMATOCRIT: 38.1 % — AB (ref 40.0–52.0)
HEMOGLOBIN: 13.2 g/dL (ref 13.0–18.0)
LYMPHS ABS: 1.1 10*3/uL (ref 1.0–3.6)
LYMPHS PCT: 27 %
MCH: 30.6 pg (ref 26.0–34.0)
MCHC: 34.6 g/dL (ref 32.0–36.0)
MCV: 88.6 fL (ref 80.0–100.0)
MONO ABS: 0.5 10*3/uL (ref 0.2–1.0)
MONOS PCT: 11 %
NEUTROS ABS: 2.4 10*3/uL (ref 1.4–6.5)
NEUTROS PCT: 56 %
Platelets: 175 10*3/uL (ref 150–440)
RBC: 4.3 MIL/uL — ABNORMAL LOW (ref 4.40–5.90)
RDW: 13.2 % (ref 11.5–14.5)
WBC: 4.2 10*3/uL (ref 3.8–10.6)

## 2017-07-26 LAB — BASIC METABOLIC PANEL
ANION GAP: 8 (ref 5–15)
BUN: 24 mg/dL — AB (ref 6–20)
CHLORIDE: 108 mmol/L (ref 101–111)
CO2: 23 mmol/L (ref 22–32)
Calcium: 9.8 mg/dL (ref 8.9–10.3)
Creatinine, Ser: 1.34 mg/dL — ABNORMAL HIGH (ref 0.61–1.24)
GFR calc Af Amer: 56 mL/min — ABNORMAL LOW (ref >60)
GFR calc non Af Amer: 49 mL/min — ABNORMAL LOW (ref >60)
GLUCOSE: 96 mg/dL (ref 65–99)
POTASSIUM: 4.6 mmol/L (ref 3.5–5.1)
Sodium: 139 mmol/L (ref 135–145)

## 2017-07-26 LAB — PSA: Prostatic Specific Antigen: 0.11 ng/mL (ref 0.00–4.00)

## 2017-07-26 NOTE — Progress Notes (Signed)
No new changes noted today 

## 2017-07-26 NOTE — Patient Instructions (Signed)
#   Take Zytiga [one pill] 250 mg with a low-fat meal/breakfast.

## 2017-07-26 NOTE — Progress Notes (Signed)
[facility] OFFICE PROGRESS NOTE  Patient Care Team: Einar Pheasant, MD as PCP - General (Internal Medicine)  Cancer Staging No matching staging information was found for the patient.   Oncology History   Nov 2017- completed IM RT radiation therapy to his prostate and pelvic nodes for Gleason 7 (4+3) adenocarcinoma the prostate presenting the PSA of 7.8.  # JAN 2019- METASTATIC PROSTATE CA to Bone [low volume met on bone scan; CT- NED]; PSA ~50; March 25th 2019- Lupron 45 mg IM [Urology q 7M];  # May 23rd Zytiga 268m/day; no prednisone   # OBenita Stabile [place]]  -------------------------------------------------------------------------  DIAGNOSIS: [ JAN 2019]- Met- PROSTATE CANCER  STAGE:  IV       ;GOALS: PALLIATIVE  CURRENT/MOST RECENT THERAPY [ march 2019]- Lupron; May 23rd 2019-Zytiga      Malignant neoplasm of prostate ([place]      INTERVAL HISTORY:  Nathaniel HIRSCHMAN89y.o.  male pleasant patient above history of metastatic castrate sensitive prostate cancer is here for follow-up.  In the interim he has been evaluated by cardiology/Dr. KYancey Flemingsfor syncope.  Currently denies any dizziness.  Denies any falls.  No further syncopal episodes.  Denies any nausea vomiting.  Review of Systems  Constitutional: Negative for chills, diaphoresis, fever, malaise/fatigue and weight loss.  HENT: Negative for nosebleeds and sore throat.   Eyes: Negative for double vision.  Respiratory: Negative for cough, hemoptysis, sputum production, shortness of breath and wheezing.   Cardiovascular: Negative for chest pain, palpitations, orthopnea and leg swelling.  Gastrointestinal: Negative for abdominal pain, blood in stool, constipation, diarrhea, heartburn, melena, nausea and vomiting.  Genitourinary: Negative for dysuria, frequency and urgency.  Musculoskeletal: Negative for back pain and joint pain.  Skin: Negative.  Negative for itching and rash.  Neurological: Negative  for dizziness, tingling, focal weakness, weakness and headaches.  Endo/Heme/Allergies: Does not bruise/bleed easily.  Psychiatric/Behavioral: Negative for depression. The patient is nervous/anxious. The patient does not have insomnia.       PAST MEDICAL HISTORY :  Past Medical History:  Diagnosis Date  . Allergic rhinitis   . Barrett's esophagus 10/21/13  . [place]   . Chronic kidney disease    stones  . Colon polyp, hyperplastic   . COPD (chronic obstructive pulmonary disease) (HCC)    mild COPD. former smoker  . Coronary artery disease   . Diabetes mellitus without complication ([place]   . Diverticulosis   . Fundic gland polyps of stomach, benign   . Gastritis   . GERD (gastroesophageal reflux disease)   . Hypercholesteremia   . Hypertension   . [place]   . Prostatism   . Reflux esophagitis   . Renal stones   . Skin cancer    left cheek/lesion excised  . Skin cancer   . Sleep apnea   . TIA (transient ischemic attack)   . TIA (transient ischemic attack)     PAST SURGICAL HISTORY :   Past Surgical History:  Procedure Laterality Date  . CARDIAC CATHETERIZATION    . COLONOSCOPY WITH PROPOFOL N/A 09/13/2015   Procedure: COLONOSCOPY WITH PROPOFOL;  Surgeon: MLollie Sails MD;  [facility]  Service: Endoscopy;  Laterality: N/A;  . CORONARY ANGIOPLASTY    . ESOPHAGOGASTRODUODENOSCOPY (EGD) WITH PROPOFOL N/A 09/13/2015   Procedure: ESOPHAGOGASTRODUODENOSCOPY (EGD) WITH PROPOFOL;  Surgeon: MLollie Sails MD;  [facility];  Service: Endoscopy;  Laterality: N/A;  . ESOPHAGOGASTRODUODENOSCOPY (EGD) WITH PROPOFOL N/A 12/28/2015  Procedure: ESOPHAGOGASTRODUODENOSCOPY (EGD) WITH PROPOFOL;  Surgeon: Lollie Sails, MD;  Location: [facility] ENDOSCOPY;  Service: Endoscopy;  Laterality: N/A;  . ESOPHAGOGASTRODUODENOSCOPY (EGD) WITH PROPOFOL N/A 06/16/2016   Procedure: ESOPHAGOGASTRODUODENOSCOPY (EGD) WITH PROPOFOL;  Surgeon: Lollie Sails, MD;  [facility];  Service: Endoscopy;  Laterality: N/A;  . EYE SURGERY  2013   CATARACT EXTRACTION  . GANGLION CYST EXCISION Right 05/18/2017   Procedure: REMOVAL GANGLION CYST ANKLE;  Surgeon: Samara Deist, DPM;  Location: [facility] ORS;  Service: Podiatry;  Laterality: Right;  . heart cath stent    . PROSTATE BIOPSY    . stents     multiple    FAMILY HISTORY :   Family History  Problem Relation Age of Onset  . Cancer Mother        gastric and lung  . Cancer Father        multiple myeloma  . Stroke Father   . Cancer Sister        leukemia  . Cancer Brother        leukemia  . Cancer Brother        kidney  . Cancer Daughter        Uterine  . Cancer Other        Nephes (Sister's Son): Prostate    SOCIAL HISTORY:   Social History   Tobacco Use  . Smoking status: Former Smoker    Packs/day: 1.00    Years: 35.00    Pack years: 35.00    Types: Cigarettes    Last attempt to quit: 03/06/1986    Years since quitting: 31.4  . Smokeless tobacco: Former Systems developer    Types: Chew  Substance Use Topics  . Alcohol use: No  . Drug use: No    ALLERGIES:  is allergic to atorvastatin.  MEDICATIONS:  Current Outpatient Medications  Medication Sig Dispense Refill  . aspirin 81 MG tablet Take 81 mg by mouth daily.    . calcium citrate-vitamin D (CITRACAL+D) 315-200 MG-UNIT tablet Take 1 tablet by mouth daily.    . cholecalciferol (VITAMIN D) 1000 units tablet Take 1,000 Units by mouth daily.     . clopidogrel (PLAVIX) 75 MG tablet Take 1 tablet (75 mg total) by mouth daily. 90 tablet 0  . Degarelix Acetate (FIRMAGON [place]) Inject into the skin.    [name] losartan (COZAAR) 50 MG tablet Take 50 mg by mouth daily.    [name] lovastatin (MEVACOR) 40 MG tablet TAKE 2 TABLETS BY MOUTH AT BEDTIME 180 tablet 1  . metoprolol succinate (TOPROL-XL) 25 MG 24 hr tablet Take 25 mg by mouth daily.    . Multiple Vitamin (MULTI-VITAMINS) TABS Take 1 tablet by mouth daily.     . pantoprazole  (PROTONIX) 40 MG tablet Take 40 mg by mouth 2 (two) times daily.     . ranitidine (ZANTAC) 150 MG tablet Take 150 mg by mouth daily.     [name] abiraterone acetate (ZYTIGA) 250 MG tablet Take 4 tablets (1,000 mg total) by mouth daily. Take on an empty stomach 1 hour before or 2 hours after a meal (Patient not taking: Reported on 07/26/2017) 120 tablet 4  . fexofenadine (ALLEGRA) 180 MG tablet Take 180 mg by mouth daily as needed for allergies or rhinitis.    . fluticasone (FLONASE) 50 MCG/ACT nasal spray Place 2 sprays into both nostrils daily.    . Ipratropium-Albuterol (COMBIVENT) 20-100 MCG/ACT AERS respimat 2 puffs qid prn (Patient not taking: Reported on 07/26/2017)  1 Inhaler 3  . isosorbide mononitrate (IMDUR) 30 MG 24 hr tablet TAKE 1 TABLET BY MOUTH ONCE DAILY (Patient not taking: Reported on 07/26/2017) 90 tablet 2  . nitroGLYCERIN (NITROSTAT) 0.4 MG SL tablet Place 0.4 mg under the tongue every 5 (five) minutes as needed for chest pain.    . Polyethylene Glycol 3350 (MIRALAX PO) Take 1 packet by mouth.     No current facility-administered medications for this visit.     PHYSICAL EXAMINATION: ECOG PERFORMANCE STATUS: 0 - Asymptomatic  BP (!) 145/72 (BP Location: Left Arm, Patient Position: Sitting)   Pulse (!) 53   Temp 97.8 F (36.6 C) (Oral)   Resp 18   Wt 209 lb 9.6 oz (95.1 kg)   SpO2 100%   BMI 32.34 kg/m   Filed Weights   07/26/17 1022  Weight: 209 lb 9.6 oz (95.1 kg)    GENERAL: Well-nourished well-developed; Alert, no distress and comfortable.  Alone.  EYES: no pallor or icterus OROPHARYNX: no thrush or ulceration; NECK: supple; no lymph nodes felt. LYMPH:  no palpable lymphadenopathy in the axillary or inguinal regions LUNGS: Decreased breath sounds auscultation bilaterally. No wheeze or crackles HEART/CVS: regular rate & rhythm and no murmurs; No lower extremity edema ABDOMEN:abdomen soft, non-tender and normal bowel sounds. No hepatomegaly or splenomegaly.   Musculoskeletal:no cyanosis of digits and no clubbing  PSYCH: alert & oriented x 3 with fluent speech NEURO: no focal motor/sensory deficits SKIN:  no rashes or significant lesions    LABORATORY DATA:  I have reviewed the data as listed    Component Value Date/Time   NA 139 07/26/2017 0934   NA 140 09/16/2013 0438   K 4.6 07/26/2017 0934   K 4.2 09/16/2013 0438   CL 108 07/26/2017 0934   CL 109 (H) 09/16/2013 0438   CO2 23 07/26/2017 0934   CO2 24 09/16/2013 0438   GLUCOSE 96 07/26/2017 0934   GLUCOSE 101 (H) 09/16/2013 0438   BUN 24 (H) 07/26/2017 0934   BUN 20 (H) 09/16/2013 0438   CREATININE 1.34 (H) 07/26/2017 0934   CREATININE 1.28 09/16/2013 0438   CALCIUM 9.8 07/26/2017 0934   CALCIUM 8.3 (L) 09/16/2013 0438   PROT 6.4 07/09/2017 0801   PROT 6.7 09/15/2013 0755   ALBUMIN 3.7 07/09/2017 0801   ALBUMIN 3.5 09/15/2013 0755   AST 19 07/09/2017 0801   AST 37 09/15/2013 0755   ALT 15 07/09/2017 0801   ALT 28 09/15/2013 0755   ALKPHOS 42 07/09/2017 0801   ALKPHOS 44 (L) 09/15/2013 0755   BILITOT 0.5 07/09/2017 0801   BILITOT 0.5 09/15/2013 0755   GFRNONAA 49 (L) 07/26/2017 0934   GFRNONAA 54 (L) 09/16/2013 0438   GFRAA 56 (L) 07/26/2017 0934   GFRAA >60 09/16/2013 0438    No results found for: SPEP, UPEP  Lab Results  Component Value Date   WBC 4.2 07/26/2017   NEUTROABS 2.4 07/26/2017   HGB 13.2 07/26/2017   HCT 38.1 (L) 07/26/2017   MCV 88.6 07/26/2017   PLT 175 07/26/2017      Chemistry      Component Value Date/Time   NA 139 07/26/2017 0934   NA 140 09/16/2013 0438   K 4.6 07/26/2017 0934   K 4.2 09/16/2013 0438   CL 108 07/26/2017 0934   CL 109 (H) 09/16/2013 0438   CO2 23 07/26/2017 0934   CO2 24 09/16/2013 0438   BUN 24 (H) 07/26/2017 0934   BUN 20 (H) 09/16/2013  0315   CREATININE 1.34 (H) 07/26/2017 0934   CREATININE 1.28 09/16/2013 0438      Component Value Date/Time   CALCIUM 9.8 07/26/2017 0934   CALCIUM 8.3 (L) 09/16/2013 0438    ALKPHOS 42 07/09/2017 0801   ALKPHOS 44 (L) 09/15/2013 0755   AST 19 07/09/2017 0801   AST 37 09/15/2013 0755   ALT 15 07/09/2017 0801   ALT 28 09/15/2013 0755   BILITOT 0.5 07/09/2017 0801   BILITOT 0.5 09/15/2013 0755       RADIOGRAPHIC STUDIES: I have personally reviewed the radiological images as listed and agreed with the findings in the report. No results found.   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 Stage IV-on ADT; improving PSA.  #After lengthy discussion patient finally agrees to start Zytiga; however very concerned about the side effects.  Recommend starting Zytiga 1 pill/with low-fat meal once a day [based on a phase 2 data that showed equivalent therapeutic efficacy.].  Okay to avoid prednisone because of patient's preference.  #Syncope episode-work-up negative so far.  Stable.  #Hypertension stable; recommend close monitoring while on Zytiga.  This should be less likely as patient will be on a single pill.   # follow up on Juje 27th weeks/MD/ call in bet ween.  Recommend discussing with urology regarding continuation of Lupron injections.  Cc; Dr.Ottlin; [place].    Orders Placed This Encounter  Procedures  . CBC with Differential    Standing Status:   Future    Standing Expiration Date:   07/27/2018  . Comprehensive metabolic panel    Standing Status:   Future    Standing Expiration Date:   07/27/2018  . PSA    Standing Status:   Future    Standing Expiration Date:   07/27/2018   All questions were answered. The patient knows to call the clinic with any problems, questions or concerns.      Cammie Sickle, MD 07/31/2017 8:37 PM

## 2017-07-26 NOTE — Assessment & Plan Note (Addendum)
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 Stage IV-on ADT; improving PSA.  #After lengthy discussion patient finally agrees to start Zytiga; however very concerned about the side effects.  Recommend starting Zytiga 1 pill/with low-fat meal once a day [based on a phase 2 data that showed equivalent therapeutic efficacy.].  Okay to avoid prednisone because of patient's preference.  #Syncope episode-work-up negative so far.  Stable.  #Hypertension stable; recommend close monitoring while on Zytiga.  This should be less likely as patient will be on a single pill.   # follow up on Juje 27th weeks/MD/ call in bet ween.  Recommend discussing with urology regarding continuation of Lupron injections.  Cc; Dr.Ottlin; [place].

## 2017-08-07 ENCOUNTER — Encounter: Payer: Self-pay | Admitting: Internal Medicine

## 2017-08-07 ENCOUNTER — Ambulatory Visit: Payer: Medicare HMO | Admitting: Internal Medicine

## 2017-08-07 DIAGNOSIS — K2270 Barrett's esophagus without dysplasia: Secondary | ICD-10-CM

## 2017-08-07 DIAGNOSIS — J449 Chronic obstructive pulmonary disease, unspecified: Secondary | ICD-10-CM

## 2017-08-07 DIAGNOSIS — G4730 Sleep apnea, unspecified: Secondary | ICD-10-CM

## 2017-08-07 DIAGNOSIS — I2510 Atherosclerotic heart disease of native coronary artery without angina pectoris: Secondary | ICD-10-CM

## 2017-08-07 DIAGNOSIS — I10 Essential (primary) hypertension: Secondary | ICD-10-CM | POA: Diagnosis not present

## 2017-08-07 DIAGNOSIS — R42 Dizziness and giddiness: Secondary | ICD-10-CM

## 2017-08-07 DIAGNOSIS — E7800 Pure hypercholesterolemia, unspecified: Secondary | ICD-10-CM | POA: Diagnosis not present

## 2017-08-07 DIAGNOSIS — N189 Chronic kidney disease, unspecified: Secondary | ICD-10-CM

## 2017-08-07 DIAGNOSIS — C61 Malignant neoplasm of prostate: Secondary | ICD-10-CM

## 2017-08-07 DIAGNOSIS — I739 Peripheral vascular disease, unspecified: Secondary | ICD-10-CM

## 2017-08-07 DIAGNOSIS — R739 Hyperglycemia, unspecified: Secondary | ICD-10-CM

## 2017-08-07 DIAGNOSIS — I779 Disorder of arteries and arterioles, unspecified: Secondary | ICD-10-CM | POA: Diagnosis not present

## 2017-08-07 DIAGNOSIS — Z8673 Personal history of transient ischemic attack (TIA), and cerebral infarction without residual deficits: Secondary | ICD-10-CM

## 2017-08-07 NOTE — Progress Notes (Signed)
Patient ID: Nathaniel Thompson, male   DOB: Jul 14, 1935, 82 y.o.   MRN: [id number]   Subjective:    Patient ID: Nathaniel Thompson, male    DOB: 06-Sep-1935, 82 y.o.   MRN: [id number]  HPI  Patient here for a scheduled follow up.  He is accompanied by his wife.  History obtained from both of them.  He has a history of CAD s/p stent placement. Is undergoing w/up for dizziness and sob.  CTA carotids showed no hemodynamically significant stenosis.  Not orthostatic.  Small increase in calcium score.  Also had holter, unrevealing.  Was started on metoprolol and meclizine.  Also referred to ENT.  Saw ENT - felt unlikely a peripheral vestibulopathy.  He just had f/u wth cardiology 07/19/17.  Felt dizziness better.  No changes made.  He comes in today stating that he has had a few episodes over the last few weeks.  States had an episode after sitting in chair.  Got up to go to the bathroom.  Noticed a "prickly" sensation running through his body.  Lasted for a brief period and resolved without intervention.  Second episode, he felt the same sensation, but went down to his shoulders.  The third episode was similar.  He noticed tingling in his arm to his fingers.  Could move arm.  No weakness.  Still having some "light headed" sensation when up for a long period.  States just feels the episodes are not as severe and do no last as long.  No chest pain.  No acid reflux.  No abdominal pain or cramping.  Bowels moving.    Past Medical History:  Diagnosis Date  . Allergic rhinitis   . Barrett's esophagus 10/21/13  . Bone cancer ([place])   . Chronic kidney disease    stones  . Colon polyp, hyperplastic   . COPD (chronic obstructive pulmonary disease) (HCC)    mild COPD. former smoker  . Coronary artery disease   . Diabetes mellitus without complication ([place])   . Diverticulosis   . Fundic gland polyps of stomach, benign   . Gastritis   . GERD (gastroesophageal reflux disease)   . Hypercholesteremia   . Hypertension   .  Prostate cancer ([place])   . Prostatism   . Reflux esophagitis   . Renal stones   . Skin cancer    left cheek/lesion excised  . Skin cancer   . Sleep apnea   . TIA (transient ischemic attack)   . TIA (transient ischemic attack)    Past Surgical History:  Procedure Laterality Date  . CARDIAC CATHETERIZATION    . COLONOSCOPY WITH PROPOFOL N/A 09/13/2015   Procedure: COLONOSCOPY WITH PROPOFOL;  Surgeon: Lollie Sails, MD;  Location: [facility] ENDOSCOPY;  Service: Endoscopy;  Laterality: N/A;  . CORONARY ANGIOPLASTY    . ESOPHAGOGASTRODUODENOSCOPY (EGD) WITH PROPOFOL N/A 09/13/2015   Procedure: ESOPHAGOGASTRODUODENOSCOPY (EGD) WITH PROPOFOL;  Surgeon: Lollie Sails, MD;  Location: [facility] ENDOSCOPY;  Service: Endoscopy;  Laterality: N/A;  . ESOPHAGOGASTRODUODENOSCOPY (EGD) WITH PROPOFOL N/A 12/28/2015   Procedure: ESOPHAGOGASTRODUODENOSCOPY (EGD) WITH PROPOFOL;  Surgeon: Lollie Sails, MD;  Location: [facility] ENDOSCOPY;  Service: Endoscopy;  Laterality: N/A;  . ESOPHAGOGASTRODUODENOSCOPY (EGD) WITH PROPOFOL N/A 06/16/2016   Procedure: ESOPHAGOGASTRODUODENOSCOPY (EGD) WITH PROPOFOL;  Surgeon: Lollie Sails, MD;  Location: [facility] ENDOSCOPY;  Service: Endoscopy;  Laterality: N/A;  . EYE SURGERY  2013   CATARACT EXTRACTION  . GANGLION CYST EXCISION Right 05/18/2017   Procedure: REMOVAL GANGLION CYST ANKLE;  Surgeon: Samara Deist, DPM;  Location: [facility] ORS;  Service: Podiatry;  Laterality: Right;  . heart cath stent    . PROSTATE BIOPSY    . stents     multiple   Family History  Problem Relation Age of Onset  . Cancer Mother        gastric and lung  . Cancer Father        multiple myeloma  . Stroke Father   . Cancer Sister        leukemia  . Cancer Brother        leukemia  . Cancer Brother        kidney  . Cancer Daughter        Uterine  . Cancer Other        [place] (Sister's Son): Prostate   Social History   Socioeconomic History  . Marital status: Married    Spouse name:  Not on file  . Number of children: Not on file  . Years of education: Not on file  . Highest education level: Not on file  Occupational History  . Not on file  Social Needs  . Financial resource strain: Not hard at all  . Food insecurity:    Worry: Never true    Inability: Never true  . Transportation needs:    Medical: No    Non-medical: No  Tobacco Use  . Smoking status: Former Smoker    Packs/day: 1.00    Years: 35.00    Pack years: 35.00    Types: Cigarettes    Last attempt to quit: 03/06/1986    Years since quitting: 31.4  . Smokeless tobacco: Former Systems developer    Types: Chew  Substance and Sexual Activity  . Alcohol use: No  . Drug use: No  . Sexual activity: Not Currently  Lifestyle  . Physical activity:    Days per week: Not on file    Minutes per session: Not on file  . Stress: Not on file  Relationships  . Social connections:    Talks on phone: Not on file    Gets together: Not on file    Attends religious service: Not on file    Active member of club or organization: Not on file    Attends meetings of clubs or organizations: Not on file    Relationship status: Not on file  Other Topics Concern  . Not on file  Social History Narrative  . Not on file    Outpatient Encounter Medications as of 08/07/2017  Medication Sig  . abiraterone acetate (ZYTIGA) 250 MG tablet Take 250 mg by mouth daily.  [name] aspirin 81 MG tablet Take 81 mg by mouth daily.  . calcium citrate-vitamin D (CITRACAL+D) 315-200 MG-UNIT tablet Take 1 tablet by mouth daily.  . cholecalciferol (VITAMIN D) 1000 units tablet Take 1,000 Units by mouth daily.   . clopidogrel (PLAVIX) 75 MG tablet Take 1 tablet (75 mg total) by mouth daily.  . Degarelix Acetate (FIRMAGON Susan Moore) Inject into the skin.  . fexofenadine (ALLEGRA) 180 MG tablet Take 180 mg by mouth daily as needed for allergies or rhinitis.  . fluticasone (FLONASE) 50 MCG/ACT nasal spray Place 2 sprays into both nostrils daily.  .  Ipratropium-Albuterol (COMBIVENT) 20-100 MCG/ACT AERS respimat 2 puffs qid prn (Patient not taking: Reported on 07/26/2017)  . isosorbide mononitrate (IMDUR) 30 MG 24 hr tablet TAKE 1 TABLET BY MOUTH ONCE DAILY (Patient not taking: Reported on 07/26/2017)  . losartan (COZAAR) 50  MG tablet Take 50 mg by mouth daily.  [name] lovastatin (MEVACOR) 40 MG tablet TAKE 2 TABLETS BY MOUTH AT BEDTIME  . metoprolol succinate (TOPROL-XL) 25 MG 24 hr tablet Take 25 mg by mouth daily.  . Multiple Vitamin (MULTI-VITAMINS) TABS Take 1 tablet by mouth daily.   . nitroGLYCERIN (NITROSTAT) 0.4 MG SL tablet Place 0.4 mg under the tongue every 5 (five) minutes as needed for chest pain.  . pantoprazole (PROTONIX) 40 MG tablet Take 40 mg by mouth 2 (two) times daily.   . Polyethylene Glycol 3350 (MIRALAX PO) Take 1 packet by mouth.  . ranitidine (ZANTAC) 150 MG tablet Take 150 mg by mouth daily.   . [DISCONTINUED] abiraterone acetate (ZYTIGA) 250 MG tablet Take 4 tablets (1,000 mg total) by mouth daily. Take on an empty stomach 1 hour before or 2 hours after a meal (Patient not taking: Reported on 07/26/2017)   No facility-administered encounter medications on file as of 08/07/2017.     Review of Systems  Constitutional: Negative for appetite change and unexpected weight change.  HENT: Negative for congestion and sinus pressure.   Respiratory: Negative for cough, chest tightness and shortness of breath.   Cardiovascular: Negative for chest pain, palpitations and leg swelling.  Gastrointestinal: Negative for abdominal pain, diarrhea, nausea and vomiting.  Genitourinary: Negative for difficulty urinating and dysuria.  Musculoskeletal: Negative for joint swelling and myalgias.  Skin: Negative for color change and rash.  Neurological: Positive for dizziness and light-headedness. Negative for headaches.       Describes the prickling sensation and tingling as outlined.  Intermittent.    Psychiatric/Behavioral: Negative for  agitation and dysphoric mood.       Objective:    Physical Exam  Constitutional: He appears well-developed and well-nourished. No distress.  HENT:  Nose: Nose normal.  Mouth/Throat: Oropharynx is clear and moist.  Neck: Neck supple. No thyromegaly present.  Cardiovascular: Normal rate and regular rhythm.  Pulmonary/Chest: Effort normal and breath sounds normal. No respiratory distress.  Abdominal: Soft. Bowel sounds are normal. There is no tenderness.  Musculoskeletal: He exhibits no edema or tenderness.  Lymphadenopathy:    He has no cervical adenopathy.  Neurological:  No motor weakness noted.  No focal neurological deficits noted.    Skin: No rash noted. No erythema.  Psychiatric: He has a normal mood and affect. His behavior is normal.    BP 134/78 (BP Location: Right Arm, Patient Position: Sitting, Cuff Size: Normal)   Pulse 60   Temp 97.9 F (36.6 C) (Oral)   Resp 18   Wt 210 lb 9.6 oz (95.5 kg)   SpO2 97%   BMI 32.50 kg/m  Wt Readings from Last 3 Encounters:  08/07/17 210 lb 9.6 oz (95.5 kg)  07/26/17 209 lb 9.6 oz (95.1 kg)  07/11/17 210 lb 6.4 oz (95.4 kg)     Lab Results  Component Value Date   WBC 4.2 07/26/2017   HGB 13.2 07/26/2017   HCT 38.1 (L) 07/26/2017   PLT 175 07/26/2017   GLUCOSE 96 07/26/2017   CHOL 137 07/09/2017   TRIG 92.0 07/09/2017   HDL 33.30 (L) 07/09/2017   LDLCALC 85 07/09/2017   ALT 15 07/09/2017   AST 19 07/09/2017   NA 139 07/26/2017   K 4.6 07/26/2017   CL 108 07/26/2017   CREATININE 1.34 (H) 07/26/2017   BUN 24 (H) 07/26/2017   CO2 23 07/26/2017   TSH 1.76 07/13/2016   PSA 5.47 (H) 08/03/2016  HGBA1C 5.7 07/09/2017       Assessment & Plan:   Problem List Items Addressed This Visit    Barrett's esophagus    Followed by GI - Dr Gustavo Lah. On protonix.        CAD (coronary artery disease)    Has known CAD.  S/p stent placement.  Followed by Dr Chancy Milroy.  Has had extensive w/up as outlined.  No clear reason for the  dizziness.  Breathing appears to be overall stable.  Continue risk factor modification.        Carotid artery disease ([place])    Had recent CTA as outlined.  No hemodynamically significant stenosis.  Follow.        CKD (chronic kidney disease)    Avoid antiinflammatories.  Follow metabolic panel.        COPD (chronic obstructive pulmonary disease) ([place])    Some sob recently as outlined.  Cardiac w/up as outlined.  Follow.  May need formal pulmonary evlauation.        Dizziness    Presented with dizziness and sob initially.  Outside hospital w/up unrevealing.  CT head unrevealing.  Stress test - unrevealing.  W/up through Dr Chancy Milroy as outlined.  Add metoprolol and meclizine. Not taking meclizine - made too groggy.  Saw ENT.  Did not feel related to peripheral vestibulopathy.  Persistent symptoms as outlined.  Unclear etiology.  No known triggers.  Not orthostatic on exam.  Will have neurology evaluate.        Relevant Orders   Ambulatory referral to Neurology   History of TIA (transient ischemic attack)    On aspirin and plavix.  Had CT head when symptoms started (at outside hospital).  Unrevealing. Discussed possible need for MRI.  Will have neurology evaluate.  On aspirin and plavix.        Hypercholesterolemia    On lovastatin.  Low cholesterol diet and exercise.  Follow lipid panel and liver function tests.        Hyperglycemia    Low carb diet and exercise.  Follow met b and a1c.        Hypertension, essential    Blood pressure under good control.  Continue same medication regimen.  Follow pressures.  Follow metabolic panel.        Malignant neoplasm of prostate [facility])    Being followed by oncology.  On zytiga.        Relevant Medications   abiraterone acetate (ZYTIGA) 250 MG tablet   Sleep apnea    Uses cpap regularly.            Einar Pheasant, MD

## 2017-08-08 ENCOUNTER — Other Ambulatory Visit: Payer: Medicare HMO

## 2017-08-08 ENCOUNTER — Ambulatory Visit: Payer: Medicare HMO | Admitting: Internal Medicine

## 2017-08-12 ENCOUNTER — Encounter: Payer: Self-pay | Admitting: Internal Medicine

## 2017-08-12 DIAGNOSIS — R42 Dizziness and giddiness: Secondary | ICD-10-CM | POA: Insufficient documentation

## 2017-08-12 NOTE — Assessment & Plan Note (Signed)
Avoid antiinflammatories.  Follow metabolic panel.  

## 2017-08-12 NOTE — Assessment & Plan Note (Signed)
Had recent CTA as outlined.  No hemodynamically significant stenosis.  Follow.

## 2017-08-12 NOTE — Assessment & Plan Note (Signed)
On aspirin and plavix.  Had CT head when symptoms started (at outside hospital).  Unrevealing. Discussed possible need for MRI.  Will have neurology evaluate.  On aspirin and plavix.

## 2017-08-12 NOTE — Assessment & Plan Note (Signed)
Some sob recently as outlined.  Cardiac w/up as outlined.  Follow.  May need formal pulmonary evlauation.

## 2017-08-12 NOTE — Assessment & Plan Note (Signed)
Uses cpap regularly.   

## 2017-08-12 NOTE — Assessment & Plan Note (Signed)
On lovastatin.  Low cholesterol diet and exercise.  Follow lipid panel and liver function tests.   

## 2017-08-12 NOTE — Assessment & Plan Note (Signed)
Being followed by oncology.  On zytiga.

## 2017-08-12 NOTE — Assessment & Plan Note (Signed)
Low carb diet and exercise.  Follow met b and a1c.   

## 2017-08-12 NOTE — Assessment & Plan Note (Signed)
Followed by GI - Dr Gustavo Lah. On protonix.

## 2017-08-12 NOTE — Assessment & Plan Note (Signed)
Presented with dizziness and sob initially.  Outside hospital w/up unrevealing.  CT head unrevealing.  Stress test - unrevealing.  W/up through Dr Chancy Milroy as outlined.  Add metoprolol and meclizine. Not taking meclizine - made too groggy.  Saw ENT.  Did not feel related to peripheral vestibulopathy.  Persistent symptoms as outlined.  Unclear etiology.  No known triggers.  Not orthostatic on exam.  Will have neurology evaluate.

## 2017-08-12 NOTE — Assessment & Plan Note (Signed)
Blood pressure under good control.  Continue same medication regimen.  Follow pressures.  Follow metabolic panel.   

## 2017-08-12 NOTE — Assessment & Plan Note (Signed)
Has known CAD.  S/p stent placement.  Followed by Dr Chancy Milroy.  Has had extensive w/up as outlined.  No clear reason for the dizziness.  Breathing appears to be overall stable.  Continue risk factor modification.

## 2017-08-23 DIAGNOSIS — K22719 Barrett's esophagus with dysplasia, unspecified: Secondary | ICD-10-CM | POA: Diagnosis not present

## 2017-08-30 ENCOUNTER — Other Ambulatory Visit: Payer: Self-pay

## 2017-08-30 ENCOUNTER — Telehealth: Payer: Self-pay | Admitting: Pharmacist

## 2017-08-30 ENCOUNTER — Inpatient Hospital Stay: Payer: Medicare HMO | Admitting: Internal Medicine

## 2017-08-30 ENCOUNTER — Inpatient Hospital Stay: Payer: Medicare HMO | Attending: Internal Medicine

## 2017-08-30 VITALS — BP 171/88 | HR 52 | Temp 97.9°F | Resp 18 | Ht 67.5 in | Wt 205.0 lb

## 2017-08-30 DIAGNOSIS — Z8051 Family history of malignant neoplasm of kidney: Secondary | ICD-10-CM | POA: Insufficient documentation

## 2017-08-30 DIAGNOSIS — R42 Dizziness and giddiness: Secondary | ICD-10-CM

## 2017-08-30 DIAGNOSIS — Z801 Family history of malignant neoplasm of trachea, bronchus and lung: Secondary | ICD-10-CM

## 2017-08-30 DIAGNOSIS — Z806 Family history of leukemia: Secondary | ICD-10-CM | POA: Insufficient documentation

## 2017-08-30 DIAGNOSIS — I10 Essential (primary) hypertension: Secondary | ICD-10-CM | POA: Insufficient documentation

## 2017-08-30 DIAGNOSIS — Z807 Family history of other malignant neoplasms of lymphoid, hematopoietic and related tissues: Secondary | ICD-10-CM | POA: Insufficient documentation

## 2017-08-30 DIAGNOSIS — C7951 Secondary malignant neoplasm of bone: Secondary | ICD-10-CM | POA: Insufficient documentation

## 2017-08-30 DIAGNOSIS — Z87891 Personal history of nicotine dependence: Secondary | ICD-10-CM | POA: Insufficient documentation

## 2017-08-30 DIAGNOSIS — C61 Malignant neoplasm of prostate: Secondary | ICD-10-CM

## 2017-08-30 DIAGNOSIS — E119 Type 2 diabetes mellitus without complications: Secondary | ICD-10-CM | POA: Insufficient documentation

## 2017-08-30 DIAGNOSIS — Z800 Family history of malignant neoplasm of digestive organs: Secondary | ICD-10-CM

## 2017-08-30 LAB — CBC WITH DIFFERENTIAL/PLATELET
BASOS PCT: 1 %
Basophils Absolute: 0 10*3/uL (ref 0–0.1)
EOS ABS: 0.2 10*3/uL (ref 0–0.7)
EOS PCT: 5 %
HCT: 36.8 % — ABNORMAL LOW (ref 40.0–52.0)
HEMOGLOBIN: 12.8 g/dL — AB (ref 13.0–18.0)
Lymphocytes Relative: 26 %
Lymphs Abs: 1.3 10*3/uL (ref 1.0–3.6)
MCH: 30.5 pg (ref 26.0–34.0)
MCHC: 34.7 g/dL (ref 32.0–36.0)
MCV: 88 fL (ref 80.0–100.0)
Monocytes Absolute: 0.6 10*3/uL (ref 0.2–1.0)
Monocytes Relative: 12 %
NEUTROS PCT: 56 %
Neutro Abs: 2.8 10*3/uL (ref 1.4–6.5)
PLATELETS: 173 10*3/uL (ref 150–440)
RBC: 4.18 MIL/uL — AB (ref 4.40–5.90)
RDW: 13.2 % (ref 11.5–14.5)
WBC: 4.9 10*3/uL (ref 3.8–10.6)

## 2017-08-30 LAB — COMPREHENSIVE METABOLIC PANEL
ALK PHOS: 48 U/L (ref 38–126)
ALT: 19 U/L (ref 0–44)
AST: 29 U/L (ref 15–41)
Albumin: 3.7 g/dL (ref 3.5–5.0)
Anion gap: 7 (ref 5–15)
BILIRUBIN TOTAL: 0.8 mg/dL (ref 0.3–1.2)
BUN: 14 mg/dL (ref 8–23)
CALCIUM: 9.2 mg/dL (ref 8.9–10.3)
CO2: 23 mmol/L (ref 22–32)
CREATININE: 0.99 mg/dL (ref 0.61–1.24)
Chloride: 109 mmol/L (ref 98–111)
GFR calc Af Amer: >60 mL/min (ref >60)
Glucose, Bld: 108 mg/dL — ABNORMAL HIGH (ref 70–99)
Potassium: 4 mmol/L (ref 3.5–5.1)
SODIUM: 139 mmol/L (ref 135–145)
TOTAL PROTEIN: 6.7 g/dL (ref 6.5–8.1)

## 2017-08-30 MED ORDER — HYDROCHLOROTHIAZIDE 12.5 MG PO TABS: 12.5000 mg | ORAL_TABLET | Freq: Every day | ORAL | 1 refills | Status: DC

## 2017-08-30 NOTE — Progress Notes (Signed)
[place] OFFICE PROGRESS NOTE  Patient Care Team: Einar Pheasant, MD as PCP - General (Internal Medicine)  Cancer Staging No matching staging information was found for the patient.   Oncology History   Nov 2017- completed IM RT radiation therapy to his prostate and pelvic nodes for Gleason 7 (4+3) adenocarcinoma the prostate presenting the PSA of 7.8.  # JAN 2019- METASTATIC PROSTATE CA to Bone [low volume met on bone scan; CT- NED]; PSA ~50; March 25th 2019- Lupron 45 mg IM [Urology q 82M];  # May 23rd Zytiga 23m/day; no prednisone   # OBenita Stabile [place]]  -------------------------------------------------------------------------  DIAGNOSIS: [ JAN 2019]- Met- PROSTATE CANCER  STAGE:  IV       ;GOALS: PALLIATIVE  CURRENT/MOST RECENT THERAPY [ march 2019]- Lupron; May 23rd 2019- Zytiga 2563mday      [place]     INTERVAL HISTORY:  Nathaniel BOUSE89.[id number].  male pleasant patient above history of metastatic prostate cancer on ADT/Zytiga is here for follow-up.  Patient overall feels good.  However his intermittent episodes of dizziness without any syncopal episodes.  He is awaiting evaluation with neurology.  Denies any swelling in the legs.  Denies any nausea vomiting.  Review of Systems  Constitutional: Negative for chills, diaphoresis, fever, malaise/fatigue and weight loss.  HENT: Negative for nosebleeds and sore throat.   Eyes: Negative for double vision.  Respiratory: Negative for cough, hemoptysis, sputum production, shortness of breath and wheezing.   Cardiovascular: Negative for chest pain, palpitations, orthopnea and leg swelling.  Gastrointestinal: Negative for abdominal pain, blood in stool, constipation, diarrhea, heartburn, melena, nausea and vomiting.  Genitourinary: Negative for dysuria, frequency and urgency.  Musculoskeletal: Negative for back pain and joint pain.  Skin: Negative.  Negative for itching  and rash.  Neurological: Positive for dizziness. Negative for tingling, focal weakness, weakness and headaches.  Endo/Heme/Allergies: Does not bruise/bleed easily.  Psychiatric/Behavioral: Negative for depression. The patient is not nervous/anxious and does not have insomnia.       PAST MEDICAL HISTORY :  Past Medical History:  Diagnosis Date  . Allergic rhinitis   . Barrett's esophagus 10/21/13  . Bone cancer ([facility]  . Chronic kidney disease    stones  . Colon polyp, hyperplastic   . COPD (chronic obstructive pulmonary disease) (HCC)    mild COPD. former smoker  . Coronary artery disease   . Diabetes mellitus without complication ([place]  . Diverticulosis   . Fundic gland polyps of stomach, benign   . Gastritis   . GERD (gastroesophageal reflux disease)   . Hypercholesteremia   . Hypertension   . Prostate cancer ([place]  . Prostatism   . Reflux esophagitis   . Renal stones   . Skin cancer    left cheek/lesion excised  . Skin cancer   . Sleep apnea   . TIA (transient ischemic attack)   . TIA (transient ischemic attack)     PAST SURGICAL HISTORY :   Past Surgical History:  Procedure Laterality Date  . CARDIAC CATHETERIZATION    . COLONOSCOPY WITH PROPOFOL N/A 09/13/2015   Procedure: COLONOSCOPY WITH PROPOFOL;  Surgeon: MaLollie SailsMD;  [facility];  Service: Endoscopy;  Laterality: N/A;  . CORONARY ANGIOPLASTY    . ESOPHAGOGASTRODUODENOSCOPY (EGD) WITH PROPOFOL N/A 09/13/2015   Procedure: ESOPHAGOGASTRODUODENOSCOPY (EGD) WITH PROPOFOL;  Surgeon: MaLollie SailsMD;  [facility];  Service: Endoscopy;  Laterality: N/A;  .  ESOPHAGOGASTRODUODENOSCOPY (EGD) WITH PROPOFOL N/A 12/28/2015   Procedure: ESOPHAGOGASTRODUODENOSCOPY (EGD) WITH PROPOFOL;  Surgeon: Lollie Sails, MD;  Location: [facility] ENDOSCOPY;  Service: Endoscopy;  Laterality: N/A;  . ESOPHAGOGASTRODUODENOSCOPY (EGD) WITH PROPOFOL N/A 06/16/2016   Procedure:  ESOPHAGOGASTRODUODENOSCOPY (EGD) WITH PROPOFOL;  Surgeon: Lollie Sails, MD;  [facility];  Service: Endoscopy;  Laterality: N/A;  . EYE SURGERY  2013   CATARACT EXTRACTION  . GANGLION CYST EXCISION Right 05/18/2017   Procedure: REMOVAL GANGLION CYST ANKLE;  Surgeon: Samara Deist, DPM;  Location: [facility] ORS;  Service: Podiatry;  Laterality: Right;  . heart cath stent    . PROSTATE BIOPSY    . stents     multiple    FAMILY HISTORY :   Family History  Problem Relation Age of Onset  . Cancer Mother        gastric and lung  . Cancer Father        multiple myeloma  . Stroke Father   . Cancer Sister        leukemia  . Cancer Brother        leukemia  . Cancer Brother        kidney  . Cancer Daughter        Uterine  . Cancer Other        [place] (Sister's Son): Prostate    SOCIAL HISTORY:   Social History   Tobacco Use  . Smoking status: Former Smoker    Packs/day: 1.00    Years: 35.00    Pack years: 35.00    Types: Cigarettes    Last attempt to quit: 03/06/1986    Years since quitting: 31.5  . Smokeless tobacco: Former Systems developer    Types: Chew  Substance Use Topics  . Alcohol use: No  . Drug use: No    ALLERGIES:  is allergic to atorvastatin.  MEDICATIONS:  Current Outpatient Medications  Medication Sig Dispense Refill  . abiraterone acetate (ZYTIGA) 250 MG tablet Take 250 mg by mouth daily.    [name] aspirin 81 MG tablet Take 81 mg by mouth daily.    . calcium citrate-vitamin D (CITRACAL+D) 315-200 MG-UNIT tablet Take 1 tablet by mouth daily.    . cholecalciferol (VITAMIN D) 1000 units tablet Take 1,000 Units by mouth daily.     . clopidogrel (PLAVIX) 75 MG tablet Take 1 tablet (75 mg total) by mouth daily. 90 tablet 0  . Degarelix Acetate (FIRMAGON Lillie) Inject into the skin.    . fexofenadine (ALLEGRA) 180 MG tablet Take 180 mg by mouth daily as needed for allergies or rhinitis.    . fluticasone (FLONASE) 50 MCG/ACT nasal spray Place 2 sprays into both  nostrils daily.    . isosorbide mononitrate (IMDUR) 30 MG 24 hr tablet TAKE 1 TABLET BY MOUTH ONCE DAILY 90 tablet 2  . losartan (COZAAR) 50 MG tablet Take 50 mg by mouth daily.    [name] lovastatin (MEVACOR) 40 MG tablet TAKE 2 TABLETS BY MOUTH AT BEDTIME 180 tablet 1  . metoprolol succinate (TOPROL-XL) 25 MG 24 hr tablet Take 25 mg by mouth daily.    . Multiple Vitamin (MULTI-VITAMINS) TABS Take 1 tablet by mouth daily.     . pantoprazole (PROTONIX) 40 MG tablet Take 40 mg by mouth 2 (two) times daily.     . Polyethylene Glycol 3350 (MIRALAX PO) Take 1 packet by mouth.    . ranitidine (ZANTAC) 150 MG tablet Take 150 mg by mouth daily.     [name]  hydrochlorothiazide (HYDRODIURIL) 12.5 MG tablet Take 1 tablet (12.5 mg total) by mouth daily. In AM. 90 tablet 1  . Ipratropium-Albuterol (COMBIVENT) 20-100 MCG/ACT AERS respimat 2 puffs qid prn (Patient not taking: Reported on 07/26/2017) 1 Inhaler 3  . nitroGLYCERIN (NITROSTAT) 0.4 MG SL tablet Place 0.4 mg under the tongue every 5 (five) minutes as needed for chest pain.     No current facility-administered medications for this visit.     PHYSICAL EXAMINATION: ECOG PERFORMANCE STATUS: 0 - Asymptomatic  BP (!) 171/88   Pulse (!) 52   Temp 97.9 F (36.6 C) (Tympanic)   Resp 18   Ht 5' 7.5" (1.715 m)   Wt 205 lb (93 kg)   BMI 31.63 kg/m   Filed Weights   08/30/17 1038  Weight: 205 lb (93 kg)    GENERAL: Well-nourished well-developed; Alert, no distress and comfortable.  Accompanied by family.  EYES: no pallor or icterus OROPHARYNX: no thrush or ulceration; NECK: supple; no lymph nodes felt. LYMPH:  no palpable lymphadenopathy in the axillary or inguinal regions LUNGS: Decreased breath sounds auscultation bilaterally. No wheeze or crackles HEART/CVS: regular rate & rhythm and no murmurs; No lower extremity edema ABDOMEN:abdomen soft, non-tender and normal bowel sounds. No hepatomegaly or splenomegaly.  Musculoskeletal:no cyanosis of digits  and no clubbing  PSYCH: alert & oriented x 3 with fluent speech NEURO: no focal motor/sensory deficits SKIN:  no rashes or significant lesions    LABORATORY DATA:  I have reviewed the data as listed    Component Value Date/Time   NA 139 08/30/2017 1017   NA 140 09/16/2013 0438   K 4.0 08/30/2017 1017   K 4.2 09/16/2013 0438   CL 109 08/30/2017 1017   CL 109 (H) 09/16/2013 0438   CO2 23 08/30/2017 1017   CO2 24 09/16/2013 0438   GLUCOSE 108 (H) 08/30/2017 1017   GLUCOSE 101 (H) 09/16/2013 0438   BUN 14 08/30/2017 1017   BUN 20 (H) 09/16/2013 0438   CREATININE 0.99 08/30/2017 1017   CREATININE 1.28 09/16/2013 0438   CALCIUM 9.2 08/30/2017 1017   CALCIUM 8.3 (L) 09/16/2013 0438   PROT 6.7 08/30/2017 1017   PROT 6.7 09/15/2013 0755   ALBUMIN 3.7 08/30/2017 1017   ALBUMIN 3.5 09/15/2013 0755   AST 29 08/30/2017 1017   AST 37 09/15/2013 0755   ALT 19 08/30/2017 1017   ALT 28 09/15/2013 0755   ALKPHOS 48 08/30/2017 1017   ALKPHOS 44 (L) 09/15/2013 0755   BILITOT 0.8 08/30/2017 1017   BILITOT 0.5 09/15/2013 0755   GFRNONAA >60 08/30/2017 1017   GFRNONAA 54 (L) 09/16/2013 0438   GFRAA >60 08/30/2017 1017   GFRAA >60 09/16/2013 0438    No results found for: SPEP, UPEP  Lab Results  Component Value Date   WBC 4.9 08/30/2017   NEUTROABS 2.8 08/30/2017   HGB 12.8 (L) 08/30/2017   HCT 36.8 (L) 08/30/2017   MCV 88.0 08/30/2017   PLT 173 08/30/2017      Chemistry      Component Value Date/Time   NA 139 08/30/2017 1017   NA 140 09/16/2013 0438   K 4.0 08/30/2017 1017   K 4.2 09/16/2013 0438   CL 109 08/30/2017 1017   CL 109 (H) 09/16/2013 0438   CO2 23 08/30/2017 1017   CO2 24 09/16/2013 0438   BUN 14 08/30/2017 1017   BUN 20 (H) 09/16/2013 0438   CREATININE 0.99 08/30/2017 1017   CREATININE 1.28 09/16/2013  8251      Component Value Date/Time   CALCIUM 9.2 08/30/2017 1017   CALCIUM 8.3 (L) 09/16/2013 0438   ALKPHOS 48 08/30/2017 1017   ALKPHOS 44 (L)  09/15/2013 0755   AST 29 08/30/2017 1017   AST 37 09/15/2013 0755   ALT 19 08/30/2017 1017   ALT 28 09/15/2013 0755   BILITOT 0.8 08/30/2017 1017   BILITOT 0.5 09/15/2013 0755       RADIOGRAPHIC STUDIES: I have personally reviewed the radiological images as listed and agreed with the findings in the report. No results found.   ASSESSMENT & PLAN:  Malignant neoplasm of prostate ([place]) #Castrate sensitive-metastatic prostate cancer to the bone. Stage IV-on ADT/Zytiga low-dose. improving PSA.  Clinically improving.  #Continue Zytiga to 50 mg once a day with food.  #Elevated blood pressure-systolic blood pressure 898/42.  Worsen.  Recommend adding low-dose of hydrochlorothiazide.  #Intermittent dizzy spells-worsened-awaiting evaluation with Dr. Manuella Ghazi from neurology.  Previous syncopal work-up negative.   # follow up in 2 months/ labs-PSA [preference]; recommend talking to urology regarding continuation of Lupron shots with [place] at the cancer center.   Cc; Dr.Scott/Ottlin   Orders Placed This Encounter  Procedures  . CBC with Differential    Standing Status:   Future    Standing Expiration Date:   08/31/2018  . Comprehensive metabolic panel    Standing Status:   Future    Standing Expiration Date:   08/31/2018  . PSA    Standing Status:   Future    Standing Expiration Date:   08/31/2018   All questions were answered. The patient knows to call the clinic with any problems, questions or concerns.      Cammie Sickle, MD 09/02/2017 1:11 PM

## 2017-08-30 NOTE — Telephone Encounter (Signed)
Oral Chemotherapy Pharmacist Encounter  Follow-Up Form  Called patient today to follow up regarding patient's oral chemotherapy medication: Zytiga (abiraterone)  Original Start date of oral chemotherapy: 07/2017  Pt reports 0 tablets/doses of Zytiga missed in the last month.   Pt reports the following side effects: none reported  New medications?: None reported  Other Issues: None reported  Patient knows to call the office with questions or concerns. Oral Oncology Clinic will continue to follow.  Darl Pikes, PharmD, BCPS, [facility] Hematology/Oncology Clinical Pharmacist [facility] [phone]  08/30/2017 3:15 PM

## 2017-08-30 NOTE — Assessment & Plan Note (Addendum)
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 Stage IV-on ADT/Zytiga low-dose. improving PSA.  Clinically improving.  #Continue Zytiga to 50 mg once a day with food.  #Elevated blood pressure-systolic blood pressure 263/33.  Worsen.  Recommend adding low-dose of hydrochlorothiazide.  #Intermittent dizzy spells-worsened-awaiting evaluation with Dr. Manuella Ghazi from neurology.  Previous syncopal work-up negative.   # follow up in 2 months/ labs-PSA [preference]; recommend talking to urology regarding continuation of Lupron shots with [place] at the cancer center.   Cc; Dr.Scott/Ottlin

## 2017-08-31 DIAGNOSIS — R42 Dizziness and giddiness: Secondary | ICD-10-CM | POA: Diagnosis not present

## 2017-08-31 DIAGNOSIS — G459 Transient cerebral ischemic attack, unspecified: Secondary | ICD-10-CM | POA: Diagnosis not present

## 2017-08-31 DIAGNOSIS — R55 Syncope and collapse: Secondary | ICD-10-CM | POA: Diagnosis not present

## 2017-08-31 LAB — PSA: PROSTATIC SPECIFIC ANTIGEN: 0.02 ng/mL (ref 0.00–4.00)

## 2017-09-04 ENCOUNTER — Other Ambulatory Visit: Payer: Self-pay | Admitting: Nurse Practitioner

## 2017-09-04 DIAGNOSIS — R42 Dizziness and giddiness: Secondary | ICD-10-CM

## 2017-09-04 DIAGNOSIS — R55 Syncope and collapse: Secondary | ICD-10-CM

## 2017-09-10 MED FILL — ZYTIGA 250 MG TABLET: 250 | 30 days supply | Qty: 120 | Fill #1

## 2017-09-11 DIAGNOSIS — C61 Malignant neoplasm of prostate: Secondary | ICD-10-CM | POA: Diagnosis not present

## 2017-09-11 DIAGNOSIS — C7951 Secondary malignant neoplasm of bone: Secondary | ICD-10-CM | POA: Diagnosis not present

## 2017-09-19 ENCOUNTER — Ambulatory Visit
Admission: RE | Admit: 2017-09-19 | Discharge: 2017-09-19 | Disposition: A | Discharge status: Home / Self Care | Payer: Medicare HMO | Source: Ambulatory Visit | Attending: Nurse Practitioner | Admitting: Nurse Practitioner

## 2017-09-19 DIAGNOSIS — R42 Dizziness and giddiness: Secondary | ICD-10-CM | POA: Diagnosis not present

## 2017-09-19 DIAGNOSIS — R55 Syncope and collapse: Secondary | ICD-10-CM | POA: Diagnosis not present

## 2017-09-19 DIAGNOSIS — G9389 Other specified disorders of brain: Secondary | ICD-10-CM | POA: Insufficient documentation

## 2017-09-20 DIAGNOSIS — R55 Syncope and collapse: Secondary | ICD-10-CM | POA: Diagnosis not present

## 2017-09-21 DIAGNOSIS — G4730 Sleep apnea, unspecified: Secondary | ICD-10-CM | POA: Diagnosis not present

## 2017-09-21 DIAGNOSIS — E785 Hyperlipidemia, unspecified: Secondary | ICD-10-CM | POA: Diagnosis not present

## 2017-09-21 DIAGNOSIS — I2510 Atherosclerotic heart disease of native coronary artery without angina pectoris: Secondary | ICD-10-CM | POA: Diagnosis not present

## 2017-09-21 DIAGNOSIS — I10 Essential (primary) hypertension: Secondary | ICD-10-CM | POA: Diagnosis not present

## 2017-09-27 ENCOUNTER — Other Ambulatory Visit: Payer: Self-pay | Admitting: Internal Medicine

## 2017-10-02 DIAGNOSIS — R2689 Other abnormalities of gait and mobility: Secondary | ICD-10-CM | POA: Diagnosis not present

## 2017-10-02 DIAGNOSIS — R4189 Other symptoms and signs involving cognitive functions and awareness: Secondary | ICD-10-CM | POA: Diagnosis not present

## 2017-10-02 DIAGNOSIS — R55 Syncope and collapse: Secondary | ICD-10-CM | POA: Diagnosis not present

## 2017-10-02 DIAGNOSIS — R42 Dizziness and giddiness: Secondary | ICD-10-CM | POA: Diagnosis not present

## 2017-10-08 DIAGNOSIS — K22710 Barrett's esophagus with low grade dysplasia: Secondary | ICD-10-CM | POA: Diagnosis not present

## 2017-10-11 ENCOUNTER — Ambulatory Visit: Payer: Medicare HMO | Attending: Neurology

## 2017-10-11 VITALS — BP 126/77 | HR 70

## 2017-10-11 DIAGNOSIS — R262 Difficulty in walking, not elsewhere classified: Secondary | ICD-10-CM | POA: Diagnosis not present

## 2017-10-11 DIAGNOSIS — M6281 Muscle weakness (generalized): Secondary | ICD-10-CM

## 2017-10-11 NOTE — Patient Instructions (Signed)
Access Code: [email or web address]  URL: [email or web address]  Date: 10/11/2017  Prepared by: Roxana Hires   Exercises  Sit to Stand without Arm Support - 10 reps - 2 sets - 1x daily - 7x weekly  Standing Romberg to 1/2 Tandem Stance - 3 reps - 30 seconds with each foot forward hold - 2x daily - 7x weekly

## 2017-10-11 NOTE — Therapy (Signed)
[place] MAIN [facility] SERVICES [address] [place], [place], [address] Phone: [phone]   Fax:  [phone]  Physical Therapy Evaluation  Patient Details  Name: Nathaniel Thompson MRN: [id number] Date of Birth: Jul 12, 1935 Referring Provider: Dr. Manuella Ghazi   Encounter Date: 10/11/2017  PT End of Session - 10/11/17 1024    Visit Number  1    Number of Visits  17    Date for PT Re-Evaluation  12/06/17    Authorization Type  progress note 1/10    PT Start Time  1015    PT Stop Time  1115    PT Time Calculation (min)  60 min    Equipment Utilized During Treatment  Gait belt    Activity Tolerance  Patient tolerated treatment well    Behavior During Therapy  [facility] for tasks assessed/performed       Past Medical History:  Diagnosis Date  . Allergic rhinitis   . Barrett's esophagus 10/21/13  . Bone cancer ([place])   . Chronic kidney disease    stones  . Colon polyp, hyperplastic   . COPD (chronic obstructive pulmonary disease) (HCC)    mild COPD. former smoker  . Coronary artery disease   . Diabetes mellitus without complication ([place])   . Diverticulosis   . Fundic gland polyps of stomach, benign   . Gastritis   . GERD (gastroesophageal reflux disease)   . Hypercholesteremia   . Hypertension   . Prostate cancer ([place])   . Prostatism   . Reflux esophagitis   . Renal stones   . Skin cancer    left cheek/lesion excised  . Skin cancer   . Sleep apnea   . TIA (transient ischemic attack)   . TIA (transient ischemic attack)     Past Surgical History:  Procedure Laterality Date  . CARDIAC CATHETERIZATION    . COLONOSCOPY WITH PROPOFOL N/A 09/13/2015   Procedure: COLONOSCOPY WITH PROPOFOL;  Surgeon: Lollie Sails, MD;  Location: [facility] ENDOSCOPY;  Service: Endoscopy;  Laterality: N/A;  . CORONARY ANGIOPLASTY    . ESOPHAGOGASTRODUODENOSCOPY (EGD) WITH PROPOFOL N/A 09/13/2015   Procedure: ESOPHAGOGASTRODUODENOSCOPY (EGD) WITH PROPOFOL;  Surgeon:  Lollie Sails, MD;  Location: [facility] ENDOSCOPY;  Service: Endoscopy;  Laterality: N/A;  . ESOPHAGOGASTRODUODENOSCOPY (EGD) WITH PROPOFOL N/A 12/28/2015   Procedure: ESOPHAGOGASTRODUODENOSCOPY (EGD) WITH PROPOFOL;  Surgeon: Lollie Sails, MD;  Location: [facility] ENDOSCOPY;  Service: Endoscopy;  Laterality: N/A;  . ESOPHAGOGASTRODUODENOSCOPY (EGD) WITH PROPOFOL N/A 06/16/2016   Procedure: ESOPHAGOGASTRODUODENOSCOPY (EGD) WITH PROPOFOL;  Surgeon: Lollie Sails, MD;  [facility] ENDOSCOPY;  Service: Endoscopy;  Laterality: N/A;  . EYE SURGERY  2013   CATARACT EXTRACTION  . GANGLION CYST EXCISION Right 05/18/2017   Procedure: REMOVAL GANGLION CYST ANKLE;  Surgeon: Samara Deist, DPM;  Location: [facility] ORS;  Service: Podiatry;  Laterality: Right;  . heart cath stent    . PROSTATE BIOPSY    . stents     multiple    Vitals:   10/11/17 1021  BP: 126/77  Pulse: 70  SpO2: 99%     Subjective Assessment - 10/11/17 1014    Subjective  Balance issues    Patient is accompained by:  Family member   Wife   Pertinent History  Patient referred by neurology due to complains of unsteadiness and imbalance. Pt reports that he blacked out on April 10th, 2019 which is when his recent symptoms started. There is some indication in the medical notes that pt  complaining of dizziness however pt reports that his concerns are just related to imbalance. He did see ENT and according to patient's report he was advised that his symptoms were not related to his inner ear. He was referred to cardiology and the workup which was normal. Work up up to this point includes ECHO, Holter monitoring , CT head, MRI brain, and ultrasound of carotids which were essentially unremarkable. MRI brain did show a small area of right inferior frontal gyrus encephalomalacia has developed since 2013 and is nonspecific. Top differential considerations include the sequelae of prior trauma or small prior infarct. Pt has a history of metastatic  prostate cancer.Pt learned in January that the cancer had spread to his bones. He has metastatic lesions in his spine, hip, and pelvis but no pain. He has been treated for his prostate cancer with radiation as well as the medications Firmagon and Lupron.   Pt fell one week before Sunday. Pt reports approximately 2 falls in the last 12 months. Pt was advised to use a cane by Dr. Manuella Ghazi. He started using the cane intermittently.     Patient Stated Goals  Improve balance    Currently in Pain?  No/denies        [facility] PT Assessment - 10/11/17 1039      Assessment   Medical Diagnosis  Imbalance    Referring Provider  Dr. Katharine Look Dominance  Right    Next MD Visit  None scheduled    Prior Therapy  None for balance      Precautions   Precautions  Fall      Restrictions   Weight Bearing Restrictions  No      Balance Screen   Has the patient fallen in the past 6 months  Yes    How many times?  2    Has the patient had a decrease in activity level because of a fear of falling?   Yes    Is the patient reluctant to leave their home because of a fear of falling?   No      Home Film/video editor residence      Prior Function   Level of Independence  Independent      Cognition   Overall Cognitive Status  Within Functional Limits for tasks assessed      Observation/Other Assessments   Other Surveys   Other Surveys    Activities of Balance Confidence Scale (ABC Scale)   72.5%      Sensation   Additional Comments  Intact to light touch UE/LE      ROM / Strength   AROM / PROM / Strength  --      Strength   Overall Strength Comments  No gross deficits identified with UE/LE strength testing      Palpation   Palpation comment  Deferred      Ambulation/Gait   Gait Comments  Mildly antalgic gait. Speed [facility] for community mobility      Standardized Balance Assessment   Standardized Balance Assessment  Berg Balance Test;Dynamic Gait Index;Timed Up and Go  Test;Five Times Sit to Stand;10 meter walk test    Five times sit to stand comments   14.4s    10 Meter Walk  self-selected: 9.6=1.04 m/s, fastest: 7.4s=1.35 m/s      Berg Balance Test   Sit to Stand  Able to stand without using hands and stabilize independently    Standing Unsupported  Able to stand safely 2 minutes    Sitting with Back Unsupported but Feet Supported on Floor or Stool  Able to sit safely and securely 2 minutes    Stand to Sit  Sits safely with minimal use of hands    Transfers  Able to transfer safely, minor use of hands    Standing Unsupported with Eyes Closed  Able to stand 10 seconds with supervision    Standing Ubsupported with Feet Together  Able to place feet together independently and stand 1 minute safely    From Standing, Reach Forward with Outstretched Arm  Can reach forward >12 cm safely (5")    From Standing Position, Pick up Object from Foley to pick up shoe safely and easily    From Standing Position, Turn to Look Behind Over each Shoulder  Looks behind from both sides and weight shifts well    Turn 360 Degrees  Able to turn 360 degrees safely in 4 seconds or less    Standing Unsupported, Alternately Place Feet on Step/Stool  Able to stand independently and safely and complete 8 steps in 20 seconds    Standing Unsupported, One Foot in Front  Able to plae foot ahead of the other independently and hold 30 seconds    Standing on One Leg  Tries to lift leg/unable to hold 3 seconds but remains standing independently    Total Score  50      Dynamic Gait Index   Level Surface  Normal    Change in Gait Speed  Normal    Gait with Horizontal Head Turns  Mild Impairment    Gait with Vertical Head Turns  Normal    Gait and Pivot Turn  Normal    Step Over Obstacle  Normal    Step Around Obstacles  Normal    Steps  Mild Impairment    Total Score  22      Timed Up and Go Test   TUG  Normal TUG    Normal TUG (seconds)  9.6        Outcome measures 5TSTS:  14.4s 70m gait speed: self-selected: 9.6=1.04 m/s, fastest: 7.4s=1.35 m/s TUG: 9.6s BERG: 50/56 DGI: 22/24 MCTSIB: 30s in conditions 1-4, 2+ sway in condition 4 Vestibular testing: Saccadic smooth pursuits, hypometric saccades, VOR and thrust negative. With fixation suppression no midrange or end range nystagmus. Headshake nystagmus negative;        Objective measurements completed on examination: See above findings.        TREATMENT  Neuromuscular Re-education  Sit to stand 2 x 10 without UE support; Semitandem progressing to almost full tandem static balance alternating LE forward x 30s each; Pt issued written HEP with education regarding how to perform exercises safely;          PT Education - 10/11/17 1024    Education Details  Plan of care and HEP    Person(s) Educated  Patient    Methods  Explanation    Comprehension  Verbalized understanding       PT Short Term Goals - 10/11/17 1251      PT SHORT TERM GOAL #1   Title  Pt will be independent with HEP in order to improve strength and balance in order to decrease fall risk and improve function at home and work.     Time  8    Period  Weeks    Status  New    Target Date  12/06/17  PT Long Term Goals - 10/11/17 1252      PT LONG TERM GOAL #1   Title  Pt will decrease 5TSTS by at least 3 seconds in order to demonstrate clinically significant improvement in LE strength    Baseline  10/11/17: 14.4s    Time  8    Period  Weeks    Status  New    Target Date  12/06/17      PT LONG TERM GOAL #2   Title  Pt will improve BERG by at least 3 points in order to demonstrate clinically significant improvement in balance.     Baseline  10/11/17: 50/56    Time  8    Period  Weeks    Status  New    Target Date  12/06/17             Plan - 10/11/17 1247    Clinical Impression Statement  Pt is a pleasant 82 yo male referred by neurology due to complains of unsteadiness and imbalance. Pt reports that  he blacked out on April 10th, 2019 which is when his recent symptoms started. There is some indication in the medical notes that pt complaining of dizziness however pt reports that his concerns are just related to imbalance. PT evaluation reveals higher level balance deficits as identified by BERG of 50/56. Pt also with some LE weakness with Five Time Sit to Stand test of 14.4s. His walking speed is [facility] for full community mobility. Dynamic balance is good with DGI score of 22/24 and mCTSIB is greater than 30s in all conditions. Limited vestibular testing points to central causes for symptoms and not peripheral deficits. Pt will benefit from PT services to address deficits in strength, balance, and mobility in order to return to full function at home.     History and Personal Factors relevant to plan of care:  1 personal factors/comorbidities, 2 body systems/activity limitations/participation restrictions      Clinical Presentation  Evolving    Clinical Presentation due to:  Improving    Clinical Decision Making  Low    Rehab Potential  Good    PT Frequency  2x / week    PT Duration  8 weeks    PT Treatment/Interventions  Aquatic Therapy;ADLs/Self Care Home Management;Canalith Repostioning;Cryotherapy;Electrical Stimulation;Iontophoresis 4mg /ml Dexamethasone;Moist Heat;Traction;Ultrasound;Gait training;Stair training;Functional mobility training;Therapeutic activities;Therapeutic exercise;Balance training;Neuromuscular re-education;Patient/family education;Manual techniques;Vestibular    PT Next Visit Plan  sensation testing, Dix-Hallpike    PT Home Exercise Plan  sit to stand, semitandem/tandem balance    Consulted and Agree with Plan of Care  Patient;Family member/caregiver    Family Member Consulted  Wife       Patient will benefit from skilled therapeutic intervention in order to improve the following deficits and impairments:  Abnormal gait, Decreased strength, Decreased balance  Visit  Diagnosis: Difficulty in walking, not elsewhere classified  Muscle weakness (generalized)     Problem List Patient Active Problem List   Diagnosis Date Noted  . Dizziness 08/12/2017  . Nasal erythema 04/10/2017  . Carotid artery disease ([place]) 08/06/2016  . History of TIA (transient ischemic attack) 07/24/2016  . Sleep apnea 07/24/2016  . COPD (chronic obstructive pulmonary disease) ([place]) 07/24/2016  . CKD (chronic kidney disease) 07/24/2016  . Barrett's esophagus 07/24/2016  . Hyperglycemia 07/24/2016  . Hypercholesterolemia 07/12/2016  . Hypertension, essential 07/12/2016  . CAD (coronary artery disease) 07/12/2016  . Malignant neoplasm of prostate ([place]) 11/15/2015  . Family history of cancer 11/15/2015   Corene Cornea  D Huprich PT, DPT, GCS  Huprich,Jason 10/12/2017, 1:19 PM  [place] MAIN [facility] SERVICES [address] [place], [place], [address] Phone: [phone]   Fax:  [phone]  Name: KEYONDRE HEPBURN MRN: [id number] Date of Birth: 1935/04/21

## 2017-10-12 MED FILL — ZYTIGA 250 MG TABLET: 250 | 30 days supply | Qty: 120 | Fill #2

## 2017-10-14 ENCOUNTER — Other Ambulatory Visit: Payer: Self-pay | Admitting: Internal Medicine

## 2017-10-15 ENCOUNTER — Ambulatory Visit: Payer: Medicare HMO

## 2017-10-15 DIAGNOSIS — R262 Difficulty in walking, not elsewhere classified: Secondary | ICD-10-CM | POA: Diagnosis not present

## 2017-10-15 DIAGNOSIS — M6281 Muscle weakness (generalized): Secondary | ICD-10-CM

## 2017-10-15 NOTE — Therapy (Signed)
[place] MAIN [facility] SERVICES [address] [place], [place], [address] Phone: [phone]   Fax:  [phone]  Physical Therapy Treatment  Patient Details  Name: Nathaniel Thompson MRN: [id number] Date of Birth: May 10, 1935 Referring Provider: Dr. Manuella Ghazi   Encounter Date: 10/15/2017  PT End of Session - 10/15/17 0759    Visit Number  2    Number of Visits  17    Date for PT Re-Evaluation  12/06/17    Authorization Type  progress note 2/10    PT Start Time  0800    PT Stop Time  0844    PT Time Calculation (min)  44 min    Equipment Utilized During Treatment  Gait belt    Activity Tolerance  Patient tolerated treatment well    Behavior During Therapy  [facility] for tasks assessed/performed       Past Medical History:  Diagnosis Date  . Allergic rhinitis   . Barrett's esophagus 10/21/13  . Bone cancer ([place])   . Chronic kidney disease    stones  . Colon polyp, hyperplastic   . COPD (chronic obstructive pulmonary disease) (HCC)    mild COPD. former smoker  . Coronary artery disease   . Diabetes mellitus without complication ([place])   . Diverticulosis   . Fundic gland polyps of stomach, benign   . Gastritis   . GERD (gastroesophageal reflux disease)   . Hypercholesteremia   . Hypertension   . Prostate cancer ([place])   . Prostatism   . Reflux esophagitis   . Renal stones   . Skin cancer    left cheek/lesion excised  . Skin cancer   . Sleep apnea   . TIA (transient ischemic attack)   . TIA (transient ischemic attack)     Past Surgical History:  Procedure Laterality Date  . CARDIAC CATHETERIZATION    . COLONOSCOPY WITH PROPOFOL N/A 09/13/2015   Procedure: COLONOSCOPY WITH PROPOFOL;  Surgeon: Lollie Sails, MD;  Location: [facility] ENDOSCOPY;  Service: Endoscopy;  Laterality: N/A;  . CORONARY ANGIOPLASTY    . ESOPHAGOGASTRODUODENOSCOPY (EGD) WITH PROPOFOL N/A 09/13/2015   Procedure: ESOPHAGOGASTRODUODENOSCOPY (EGD) WITH PROPOFOL;  Surgeon:  Lollie Sails, MD;  Location: [facility] ENDOSCOPY;  Service: Endoscopy;  Laterality: N/A;  . ESOPHAGOGASTRODUODENOSCOPY (EGD) WITH PROPOFOL N/A 12/28/2015   Procedure: ESOPHAGOGASTRODUODENOSCOPY (EGD) WITH PROPOFOL;  Surgeon: Lollie Sails, MD;  Location: [facility] ENDOSCOPY;  Service: Endoscopy;  Laterality: N/A;  . ESOPHAGOGASTRODUODENOSCOPY (EGD) WITH PROPOFOL N/A 06/16/2016   Procedure: ESOPHAGOGASTRODUODENOSCOPY (EGD) WITH PROPOFOL;  Surgeon: Lollie Sails, MD;  Location: [facility] ENDOSCOPY;  Service: Endoscopy;  Laterality: N/A;  . EYE SURGERY  2013   CATARACT EXTRACTION  . GANGLION CYST EXCISION Right 05/18/2017   Procedure: REMOVAL GANGLION CYST ANKLE;  Surgeon: Samara Deist, DPM;  Location: [facility] ORS;  Service: Podiatry;  Laterality: Right;  . heart cath stent    . PROSTATE BIOPSY    . stents     multiple    There were no vitals filed for this visit.  Subjective Assessment - 10/15/17 0805    Subjective  Patient reports some dizziness with going through spinning doors today. Has been compliant with HEP, no falls since last session.     Patient is accompained by:  Family member   Wife   Pertinent History  Patient referred by neurology due to complains of unsteadiness and imbalance. Pt reports that he blacked out on April 10th, 2019 which is when his recent symptoms started.  There is some indication in the medical notes that pt complaining of dizziness however pt reports that his concerns are just related to imbalance. He did see ENT and according to patient's report he was advised that his symptoms were not related to his inner ear. He was referred to cardiology and the workup which was normal. Work up up to this point includes ECHO, Holter monitoring , CT head, MRI brain, and ultrasound of carotids which were essentially unremarkable. MRI brain did show a small area of right inferior frontal gyrus encephalomalacia has developed since 2013 and is nonspecific. Top differential considerations  include the sequelae of prior trauma or small prior infarct. Pt has a history of metastatic prostate cancer.Pt learned in January that the cancer had spread to his bones. He has metastatic lesions in his spine, hip, and pelvis but no pain. He has been treated for his prostate cancer with radiation as well as the medications Firmagon and Lupron.   Pt fell one week before Sunday. Pt reports approximately 2 falls in the last 12 months. Pt was advised to use a cane by Dr. Shah. He started using the cane intermittently.     Patient Stated Goals  Improve balance    Currently in Pain?  No/denies     18 5/83 pulse 58  162/80 pulse 61  Patients first real tx Review HEP: Semitandem progressing to almost full tandem static balance alternating LE forward x 30s each;2x each leg  Treat: Airex pad: eyes closed 3x 30 seconds posterior and left LOB Airex pad: horizontal and vertical head turns 30 seconds each attempt; vertical more challenging than horizontal  TrA activation 10x 3 second holds  Airex pad 6" step toe taps 10x no UE support occasional tripping .  BP after toe taps: 150/74 pulse 61  6" step side toe taps airex pad: 10x each leg.    Orange hurdle 10x each leg SUE support step over and back.   Side step orange hurdle 10x each leg, SUE support                      PT Education - 10/15/17 0758    Education Details  exercise technique and stability     Person(s) Educated  Patient    Methods  Explanation;Demonstration;Verbal cues    Comprehension  Verbalized understanding;Returned demonstration       PT Short Term Goals - 10/11/17 1251      PT SHORT TERM GOAL #1   Title  Pt will be independent with HEP in order to improve strength and balance in order to decrease fall risk and improve function at home and work.     Time  8    Period  Weeks    Status  New    Target Date  12/06/17        PT Long Term Goals - 10/11/17 1252      PT LONG TERM GOAL #1   Title  Pt  will decrease 5TSTS by at least 3 seconds in order to demonstrate clinically significant improvement in LE strength    Baseline  10/11/17: 14.4s    Time  8    Period  Weeks    Status  New    Target Date  12/06/17      PT LONG TERM GOAL #2   Title  Pt will improve BERG by at least 3 points in order to demonstrate clinically significant improvement in balance.     Baseline  10/11/17: 50/56    Time  8    Period  Weeks    Status  New    Target Date  12/06/17            Plan - 10/15/17 1009    Clinical Impression Statement  Patient initially challenged with hip flexion and coordination of LE's however improved with repetition. Patient's COM is challenged when vision is reduced, eyes closed, resulting in posterior and lateral LOB. Pt will benefit from PT services to address deficits in strength, balance, and mobility in order to return to full function at home.    Rehab Potential  Good    PT Frequency  2x / week    PT Duration  8 weeks    PT Treatment/Interventions  Aquatic Therapy;ADLs/Self Care Home Management;Canalith Repostioning;Cryotherapy;Electrical Stimulation;Iontophoresis 4mg /ml Dexamethasone;Moist Heat;Traction;Ultrasound;Gait training;Stair training;Functional mobility training;Therapeutic activities;Therapeutic exercise;Balance training;Neuromuscular re-education;Patient/family education;Manual techniques;Vestibular    PT Next Visit Plan  sensation testing, Dix-Hallpike    PT Home Exercise Plan  sit to stand, semitandem/tandem balance    Consulted and Agree with Plan of Care  Patient;Family member/caregiver    Family Member Consulted  Wife       Patient will benefit from skilled therapeutic intervention in order to improve the following deficits and impairments:  Abnormal gait, Decreased strength, Decreased balance  Visit Diagnosis: Difficulty in walking, not elsewhere classified  Muscle weakness (generalized)     Problem List Patient Active Problem List   Diagnosis  Date Noted  . Dizziness 08/12/2017  . Nasal erythema 04/10/2017  . Carotid artery disease ([place]) 08/06/2016  . History of TIA (transient ischemic attack) 07/24/2016  . Sleep apnea 07/24/2016  . COPD (chronic obstructive pulmonary disease) ([place]) 07/24/2016  . CKD (chronic kidney disease) 07/24/2016  . Barrett's esophagus 07/24/2016  . Hyperglycemia 07/24/2016  . Hypercholesterolemia 07/12/2016  . Hypertension, essential 07/12/2016  . CAD (coronary artery disease) 07/12/2016  . Malignant neoplasm of prostate ([place]) 11/15/2015  . Family history of cancer 11/15/2015   Janna Arch, PT, DPT   10/15/2017, 10:10 AM  [place] MAIN [facility] SERVICES [address] [place], [place], [address] Phone: [phone]   Fax:  [phone]  Name: Nathaniel Thompson MRN: [id number] Date of Birth: [birth year]

## 2017-10-17 ENCOUNTER — Ambulatory Visit: Payer: Medicare HMO

## 2017-10-17 DIAGNOSIS — M6281 Muscle weakness (generalized): Secondary | ICD-10-CM

## 2017-10-17 DIAGNOSIS — R262 Difficulty in walking, not elsewhere classified: Secondary | ICD-10-CM | POA: Diagnosis not present

## 2017-10-17 NOTE — Therapy (Signed)
[place] MAIN [facility] SERVICES [address] [place], [place], [address] Phone: [phone]   Fax:  [phone]  Physical Therapy Treatment  Patient Details  Name: Nathaniel Thompson MRN: [id number] Date of Birth: 1936-01-22 Referring Provider: Dr. Manuella Ghazi   Encounter Date: 10/17/2017  PT End of Session - 10/17/17 0805    Visit Number  3    Number of Visits  17    Date for PT Re-Evaluation  12/06/17    Authorization Type  progress note 3/10    PT Start Time  0759    PT Stop Time  0844    PT Time Calculation (min)  45 min    Equipment Utilized During Treatment  Gait belt    Activity Tolerance  Patient tolerated treatment well    Behavior During Therapy  [facility] for tasks assessed/performed       Past Medical History:  Diagnosis Date  . Allergic rhinitis   . Barrett's esophagus 10/21/13  . Bone cancer ([place])   . Chronic kidney disease    stones  . Colon polyp, hyperplastic   . COPD (chronic obstructive pulmonary disease) (HCC)    mild COPD. former smoker  . Coronary artery disease   . Diabetes mellitus without complication ([place])   . Diverticulosis   . Fundic gland polyps of stomach, benign   . Gastritis   . GERD (gastroesophageal reflux disease)   . Hypercholesteremia   . Hypertension   . Prostate cancer ([place])   . Prostatism   . Reflux esophagitis   . Renal stones   . Skin cancer    left cheek/lesion excised  . Skin cancer   . Sleep apnea   . TIA (transient ischemic attack)   . TIA (transient ischemic attack)     Past Surgical History:  Procedure Laterality Date  . CARDIAC CATHETERIZATION    . COLONOSCOPY WITH PROPOFOL N/A 09/13/2015   Procedure: COLONOSCOPY WITH PROPOFOL;  Surgeon: Lollie Sails, MD;  Location: [facility] ENDOSCOPY;  Service: Endoscopy;  Laterality: N/A;  . CORONARY ANGIOPLASTY    . ESOPHAGOGASTRODUODENOSCOPY (EGD) WITH PROPOFOL N/A 09/13/2015   Procedure: ESOPHAGOGASTRODUODENOSCOPY (EGD) WITH PROPOFOL;  Surgeon:  Lollie Sails, MD;  Location: [facility] ENDOSCOPY;  Service: Endoscopy;  Laterality: N/A;  . ESOPHAGOGASTRODUODENOSCOPY (EGD) WITH PROPOFOL N/A 12/28/2015   Procedure: ESOPHAGOGASTRODUODENOSCOPY (EGD) WITH PROPOFOL;  Surgeon: Lollie Sails, MD;  Location: [facility] ENDOSCOPY;  Service: Endoscopy;  Laterality: N/A;  . ESOPHAGOGASTRODUODENOSCOPY (EGD) WITH PROPOFOL N/A 06/16/2016   Procedure: ESOPHAGOGASTRODUODENOSCOPY (EGD) WITH PROPOFOL;  Surgeon: Lollie Sails, MD;  Location: [facility] ENDOSCOPY;  Service: Endoscopy;  Laterality: N/A;  . EYE SURGERY  2013   CATARACT EXTRACTION  . GANGLION CYST EXCISION Right 05/18/2017   Procedure: REMOVAL GANGLION CYST ANKLE;  Surgeon: Samara Deist, DPM;  Location: [facility] ORS;  Service: Podiatry;  Laterality: Right;  . heart cath stent    . PROSTATE BIOPSY    . stents     multiple    There were no vitals filed for this visit.  Subjective Assessment - 10/17/17 0802    Subjective  Patient reports having two very bad dizziness spells, one time was in the bathroom second time last night at [place]. Reports BP was 156/60.  Reports not falling but had to touch cane/wall to feel better.     Patient is accompained by:  Family member   Wife   Pertinent History  Patient referred by neurology due to complains of unsteadiness and imbalance. Pt  reports that he blacked out on April 10th, 2019 which is when his recent symptoms started. There is some indication in the medical notes that pt complaining of dizziness however pt reports that his concerns are just related to imbalance. He did see ENT and according to patient's report he was advised that his symptoms were not related to his inner ear. He was referred to cardiology and the workup which was normal. Work up up to this point includes ECHO, Holter monitoring , CT head, MRI brain, and ultrasound of carotids which were essentially unremarkable. MRI brain did show a small area of right inferior frontal gyrus encephalomalacia  has developed since 2013 and is nonspecific. Top differential considerations include the sequelae of prior trauma or small prior infarct. Pt has a history of metastatic prostate cancer.Pt learned in January that the cancer had spread to his bones. He has metastatic lesions in his spine, hip, and pelvis but no pain. He has been treated for his prostate cancer with radiation as well as the medications Firmagon and Lupron.   Pt fell one week before Sunday. Pt reports approximately 2 falls in the last 12 months. Pt was advised to use a cane by Dr. Manuella Ghazi. He started using the cane intermittently.     Patient Stated Goals  Improve balance    Currently in Pain?  No/denies       Vitals: SP02 100 pulse 56; BP: 161/78    Treat: Airex pad: eyes closed 3x 30 seconds no trunk LOB  Ambulate in hallway with horizontal head turns 2x86 ft  TrA activation 10x 3 second holds   Airex pad 6" step toe taps 10x no UE support occasional tripping .  BP after toe taps: 150/74 pulse 61   6" step side toe taps airex pad: 10x each leg.   Half foam roller; flat side up: static stand 60 seconds  Ambulate with speed ladder in // bars to cues for step length and hip flexion. 6x length of // bars.    Orange hurdle 10x each leg SUE support step over and back.    Side step orange hurdle 10x each leg, SUE support   Airex pad: balloon taps 3 minutes  Knubby bosu ball taps with PT directing color and leg; 2 minutes  Modified single limb stance with opp leg on dyna disc (green) 60 seconds each leg                         PT Education - 10/17/17 0804    Education Details  exercise technique, stability, monitoring BP     Person(s) Educated  Patient    Methods  Explanation;Demonstration;Verbal cues    Comprehension  Verbalized understanding;Returned demonstration       PT Short Term Goals - 10/11/17 1251      PT SHORT TERM GOAL #1   Title  Pt will be independent with HEP in order to improve  strength and balance in order to decrease fall risk and improve function at home and work.     Time  8    Period  Weeks    Status  New    Target Date  12/06/17        PT Long Term Goals - 10/11/17 1252      PT LONG TERM GOAL #1   Title  Pt will decrease 5TSTS by at least 3 seconds in order to demonstrate clinically significant improvement in LE strength  Baseline  10/11/17: 14.4s    Time  8    Period  Weeks    Status  New    Target Date  12/06/17      PT LONG TERM GOAL #2   Title  Pt will improve BERG by at least 3 points in order to demonstrate clinically significant improvement in balance.     Baseline  10/11/17: 50/56    Time  8    Period  Weeks    Status  New    Target Date  12/06/17            Plan - 10/17/17 0833    Clinical Impression Statement  Patient demonstrates improved stability with eyes closed with decreased posterior LOB. Patient continues to have difficulty with lifting knee for foot clearance. Pt will benefit from PT services to address deficits in strength, balance, and mobility in order to return to full function at home.    Rehab Potential  Good    PT Frequency  2x / week    PT Duration  8 weeks    PT Treatment/Interventions  Aquatic Therapy;ADLs/Self Care Home Management;Canalith Repostioning;Cryotherapy;Electrical Stimulation;Iontophoresis 4mg /ml Dexamethasone;Moist Heat;Traction;Ultrasound;Gait training;Stair training;Functional mobility training;Therapeutic activities;Therapeutic exercise;Balance training;Neuromuscular re-education;Patient/family education;Manual techniques;Vestibular    PT Next Visit Plan  sensation testing, Dix-Hallpike    PT Home Exercise Plan  sit to stand, semitandem/tandem balance    Consulted and Agree with Plan of Care  Patient;Family member/caregiver    Family Member Consulted  Wife       Patient will benefit from skilled therapeutic intervention in order to improve the following deficits and impairments:  Abnormal gait,  Decreased strength, Decreased balance  Visit Diagnosis: Difficulty in walking, not elsewhere classified  Muscle weakness (generalized)     Problem List Patient Active Problem List   Diagnosis Date Noted  . Dizziness 08/12/2017  . Nasal erythema 04/10/2017  . Carotid artery disease ([place]) 08/06/2016  . History of TIA (transient ischemic attack) 07/24/2016  . Sleep apnea 07/24/2016  . COPD (chronic obstructive pulmonary disease) ([place]) 07/24/2016  . CKD (chronic kidney disease) 07/24/2016  . Barrett's esophagus 07/24/2016  . Hyperglycemia 07/24/2016  . Hypercholesterolemia 07/12/2016  . Hypertension, essential 07/12/2016  . CAD (coronary artery disease) 07/12/2016  . Malignant neoplasm of prostate ([place]) 11/15/2015  . Family history of cancer 11/15/2015   Janna Arch, PT, DPT   10/17/2017, 8:46 AM  [place] MAIN [facility] SERVICES [address] [place], [place], [address] Phone: [phone]   Fax:  [phone]  Name: Nathaniel Thompson MRN: [id number] Date of Birth: 30-Nov-1935

## 2017-10-22 ENCOUNTER — Ambulatory Visit: Payer: Medicare HMO

## 2017-10-22 DIAGNOSIS — M6281 Muscle weakness (generalized): Secondary | ICD-10-CM | POA: Diagnosis not present

## 2017-10-22 DIAGNOSIS — R262 Difficulty in walking, not elsewhere classified: Secondary | ICD-10-CM

## 2017-10-22 NOTE — Therapy (Signed)
[place] MAIN [facility] SERVICES [address] [place], [place], [address] Phone: [phone]   Fax:  [phone]  Physical Therapy Treatment  Patient Details  Name: Nathaniel Thompson MRN: [id number] Date of Birth: October 18, 1935 Referring Provider: Dr. Manuella Ghazi   Encounter Date: 10/22/2017  PT End of Session - 10/22/17 0805    Visit Number  4    Number of Visits  17    Date for PT Re-Evaluation  12/06/17    Authorization Type  progress note 4/10    PT Start Time  0800    PT Stop Time  0845    PT Time Calculation (min)  45 min    Equipment Utilized During Treatment  Gait belt    Activity Tolerance  Patient tolerated treatment well    Behavior During Therapy  [facility] for tasks assessed/performed       Past Medical History:  Diagnosis Date  . Allergic rhinitis   . Barrett's esophagus 10/21/13  . Bone cancer ([place])   . Chronic kidney disease    stones  . Colon polyp, hyperplastic   . COPD (chronic obstructive pulmonary disease) (HCC)    mild COPD. former smoker  . Coronary artery disease   . Diabetes mellitus without complication ([place])   . Diverticulosis   . Fundic gland polyps of stomach, benign   . Gastritis   . GERD (gastroesophageal reflux disease)   . Hypercholesteremia   . Hypertension   . Prostate cancer ([place])   . Prostatism   . Reflux esophagitis   . Renal stones   . Skin cancer    left cheek/lesion excised  . Skin cancer   . Sleep apnea   . TIA (transient ischemic attack)   . TIA (transient ischemic attack)     Past Surgical History:  Procedure Laterality Date  . CARDIAC CATHETERIZATION    . COLONOSCOPY WITH PROPOFOL N/A 09/13/2015   Procedure: COLONOSCOPY WITH PROPOFOL;  Surgeon: Lollie Sails, MD;  Location: [facility] ENDOSCOPY;  Service: Endoscopy;  Laterality: N/A;  . CORONARY ANGIOPLASTY    . ESOPHAGOGASTRODUODENOSCOPY (EGD) WITH PROPOFOL N/A 09/13/2015   Procedure: ESOPHAGOGASTRODUODENOSCOPY (EGD) WITH PROPOFOL;  Surgeon:  Lollie Sails, MD;  Location: [facility] ENDOSCOPY;  Service: Endoscopy;  Laterality: N/A;  . ESOPHAGOGASTRODUODENOSCOPY (EGD) WITH PROPOFOL N/A 12/28/2015   Procedure: ESOPHAGOGASTRODUODENOSCOPY (EGD) WITH PROPOFOL;  Surgeon: Lollie Sails, MD;  Location: [facility] ENDOSCOPY;  Service: Endoscopy;  Laterality: N/A;  . ESOPHAGOGASTRODUODENOSCOPY (EGD) WITH PROPOFOL N/A 06/16/2016   Procedure: ESOPHAGOGASTRODUODENOSCOPY (EGD) WITH PROPOFOL;  Surgeon: Lollie Sails, MD;  Location: [facility] ENDOSCOPY;  Service: Endoscopy;  Laterality: N/A;  . EYE SURGERY  2013   CATARACT EXTRACTION  . GANGLION CYST EXCISION Right 05/18/2017   Procedure: REMOVAL GANGLION CYST ANKLE;  Surgeon: Samara Deist, DPM;  Location: [facility] ORS;  Service: Podiatry;  Laterality: Right;  . heart cath stent    . PROSTATE BIOPSY    . stents     multiple    There were no vitals filed for this visit.  Subjective Assessment - 10/22/17 0802    Subjective  Patietn ran into wasps on Friday and fell when trying to run away fell onto gravel. Has bruises and scrapes everywhere on L side of body on face and arm, bruises and scrapes on bilateral legs. Reports no pain now.     Patient is accompained by:  Family member   Wife   Pertinent History  Patient referred by neurology due to complains of  unsteadiness and imbalance. Pt reports that he blacked out on April 10th, 2019 which is when his recent symptoms started. There is some indication in the medical notes that pt complaining of dizziness however pt reports that his concerns are just related to imbalance. He did see ENT and according to patient's report he was advised that his symptoms were not related to his inner ear. He was referred to cardiology and the workup which was normal. Work up up to this point includes ECHO, Holter monitoring , CT head, MRI brain, and ultrasound of carotids which were essentially unremarkable. MRI brain did show a small area of right inferior frontal gyrus  encephalomalacia has developed since 2013 and is nonspecific. Top differential considerations include the sequelae of prior trauma or small prior infarct. Pt has a history of metastatic prostate cancer.Pt learned in January that the cancer had spread to his bones. He has metastatic lesions in his spine, hip, and pelvis but no pain. He has been treated for his prostate cancer with radiation as well as the medications Firmagon and Lupron.   Pt fell one week before Sunday. Pt reports approximately 2 falls in the last 12 months. Pt was advised to use a cane by Dr. Manuella Ghazi. He started using the cane intermittently.     Patient Stated Goals  Improve balance    Currently in Pain?  No/denies      BP: 177/60 pulse 57   Orange hurdle 10x each leg SUE support step over and back.    Side step orange hurdle 10x each leg, SUE support   Modified single limb stance with opp leg on dyna disc (green) 60 seconds each leg   Airex pad: balloon taps 3 minutes  Airex pad: mini squats 10x: needs Min A for body mechanics  Airex pad: one foot on each pad in modified tandem stance 3x30 seconds each foot in front  5lb ankle weights: Standing marches 10x each leg Hamstring curls 10x each leg BLE Seated: LAQ 10x each leg 3 second holds   Negotiate 5 cones weaving between with CGA x4 trials : CGA cues for upright posture   Standing heel raises 20x with UE assist.   Stand on half foam roller flat side down 60 seconds                        PT Education - 10/22/17 0804    Education Details  exercise technique, stability     Person(s) Educated  Patient    Methods  Explanation;Demonstration;Verbal cues    Comprehension  Verbalized understanding;Returned demonstration       PT Short Term Goals - 10/11/17 1251      PT SHORT TERM GOAL #1   Title  Pt will be independent with HEP in order to improve strength and balance in order to decrease fall risk and improve function at home and work.      Time  8    Period  Weeks    Status  New    Target Date  12/06/17        PT Long Term Goals - 10/11/17 1252      PT LONG TERM GOAL #1   Title  Pt will decrease 5TSTS by at least 3 seconds in order to demonstrate clinically significant improvement in LE strength    Baseline  10/11/17: 14.4s    Time  8    Period  Weeks    Status  New  Target Date  12/06/17      PT LONG TERM GOAL #2   Title  Pt will improve BERG by at least 3 points in order to demonstrate clinically significant improvement in balance.     Baseline  10/11/17: 50/56    Time  8    Period  Weeks    Status  New    Target Date  12/06/17            Plan - 10/22/17 0820    Clinical Impression Statement  Patient demonstrated improved stability on single limb stance with decreased need for assistance to retain COM. Modified squats challenging to patient to perform with difficulty understanding body mechanics. Pt will benefit from PT services to address deficits in strength, balance, and mobility in order to return to full function at home    Rehab Potential  Good    PT Frequency  2x / week    PT Duration  8 weeks    PT Treatment/Interventions  Aquatic Therapy;ADLs/Self Care Home Management;Canalith Repostioning;Cryotherapy;Electrical Stimulation;Iontophoresis 4mg /ml Dexamethasone;Moist Heat;Traction;Ultrasound;Gait training;Stair training;Functional mobility training;Therapeutic activities;Therapeutic exercise;Balance training;Neuromuscular re-education;Patient/family education;Manual techniques;Vestibular    PT Next Visit Plan  sensation testing, Dix-Hallpike    PT Home Exercise Plan  sit to stand, semitandem/tandem balance    Consulted and Agree with Plan of Care  Patient;Family member/caregiver    Family Member Consulted  Wife       Patient will benefit from skilled therapeutic intervention in order to improve the following deficits and impairments:  Abnormal gait, Decreased strength, Decreased balance  Visit  Diagnosis: Difficulty in walking, not elsewhere classified  Muscle weakness (generalized)     Problem List Patient Active Problem List   Diagnosis Date Noted  . Dizziness 08/12/2017  . Nasal erythema 04/10/2017  . Carotid artery disease ([place]) 08/06/2016  . History of TIA (transient ischemic attack) 07/24/2016  . Sleep apnea 07/24/2016  . COPD (chronic obstructive pulmonary disease) ([place]) 07/24/2016  . CKD (chronic kidney disease) 07/24/2016  . Barrett's esophagus 07/24/2016  . Hyperglycemia 07/24/2016  . Hypercholesterolemia 07/12/2016  . Hypertension, essential 07/12/2016  . CAD (coronary artery disease) 07/12/2016  . Malignant neoplasm of prostate ([place]) 11/15/2015  . Family history of cancer 11/15/2015   Janna Arch, PT, DPT   10/22/2017, 8:45 AM  [place] MAIN [facility] SERVICES [address] [place], [place], [address] Phone: [phone]   Fax:  [phone]  Name: Nathaniel Thompson MRN: [id number] Date of Birth: 1935/06/11

## 2017-10-24 ENCOUNTER — Ambulatory Visit: Payer: Medicare HMO

## 2017-10-24 DIAGNOSIS — M6281 Muscle weakness (generalized): Secondary | ICD-10-CM

## 2017-10-24 DIAGNOSIS — R262 Difficulty in walking, not elsewhere classified: Secondary | ICD-10-CM | POA: Diagnosis not present

## 2017-10-24 NOTE — Therapy (Signed)
[place] MAIN [facility] SERVICES [address] [place], [place], [address] Phone: [phone]   Fax:  [phone]  Physical Therapy Treatment  Patient Details  Name: Nathaniel Thompson MRN: [id number] Date of Birth: 1936/01/25 Referring Provider: Dr. Manuella Ghazi   Encounter Date: 10/24/2017  PT End of Session - 10/24/17 0758    Visit Number  5    Number of Visits  17    Date for PT Re-Evaluation  12/06/17    Authorization Type  progress note 5/10    PT Start Time  0800    PT Stop Time  0845    PT Time Calculation (min)  45 min    Equipment Utilized During Treatment  Gait belt    Activity Tolerance  Patient tolerated treatment well    Behavior During Therapy  [facility] for tasks assessed/performed       Past Medical History:  Diagnosis Date  . Allergic rhinitis   . Barrett's esophagus 10/21/13  . Bone cancer ([place])   . Chronic kidney disease    stones  . Colon polyp, hyperplastic   . COPD (chronic obstructive pulmonary disease) (HCC)    mild COPD. former smoker  . Coronary artery disease   . Diabetes mellitus without complication ([place])   . Diverticulosis   . Fundic gland polyps of stomach, benign   . Gastritis   . GERD (gastroesophageal reflux disease)   . Hypercholesteremia   . Hypertension   . Prostate cancer ([place])   . Prostatism   . Reflux esophagitis   . Renal stones   . Skin cancer    left cheek/lesion excised  . Skin cancer   . Sleep apnea   . TIA (transient ischemic attack)   . TIA (transient ischemic attack)     Past Surgical History:  Procedure Laterality Date  . CARDIAC CATHETERIZATION    . COLONOSCOPY WITH PROPOFOL N/A 09/13/2015   Procedure: COLONOSCOPY WITH PROPOFOL;  Surgeon: Lollie Sails, MD;  Location: [facility] ENDOSCOPY;  Service: Endoscopy;  Laterality: N/A;  . CORONARY ANGIOPLASTY    . ESOPHAGOGASTRODUODENOSCOPY (EGD) WITH PROPOFOL N/A 09/13/2015   Procedure: ESOPHAGOGASTRODUODENOSCOPY (EGD) WITH PROPOFOL;  Surgeon:  Lollie Sails, MD;  Location: [facility] ENDOSCOPY;  Service: Endoscopy;  Laterality: N/A;  . ESOPHAGOGASTRODUODENOSCOPY (EGD) WITH PROPOFOL N/A 12/28/2015   Procedure: ESOPHAGOGASTRODUODENOSCOPY (EGD) WITH PROPOFOL;  Surgeon: Lollie Sails, MD;  Location: [facility] ENDOSCOPY;  Service: Endoscopy;  Laterality: N/A;  . ESOPHAGOGASTRODUODENOSCOPY (EGD) WITH PROPOFOL N/A 06/16/2016   Procedure: ESOPHAGOGASTRODUODENOSCOPY (EGD) WITH PROPOFOL;  Surgeon: Lollie Sails, MD;  Location: [facility] ENDOSCOPY;  Service: Endoscopy;  Laterality: N/A;  . EYE SURGERY  2013   CATARACT EXTRACTION  . GANGLION CYST EXCISION Right 05/18/2017   Procedure: REMOVAL GANGLION CYST ANKLE;  Surgeon: Samara Deist, DPM;  Location: [facility] ORS;  Service: Podiatry;  Laterality: Right;  . heart cath stent    . PROSTATE BIOPSY    . stents     multiple    There were no vitals filed for this visit.  Subjective Assessment - 10/24/17 0802    Subjective  Patient reports soreness in calves after last session but no pain. Reports feeling tired today, hasn't "woken up fully yet".     Patient is accompained by:  Family member   Wife   Pertinent History  Patient referred by neurology due to complains of unsteadiness and imbalance. Pt reports that he blacked out on April 10th, 2019 which is when his recent symptoms started.  There is some indication in the medical notes that pt complaining of dizziness however pt reports that his concerns are just related to imbalance. He did see ENT and according to patient's report he was advised that his symptoms were not related to his inner ear. He was referred to cardiology and the workup which was normal. Work up up to this point includes ECHO, Holter monitoring , CT head, MRI brain, and ultrasound of carotids which were essentially unremarkable. MRI brain did show a small area of right inferior frontal gyrus encephalomalacia has developed since 2013 and is nonspecific. Top differential considerations  include the sequelae of prior trauma or small prior infarct. Pt has a history of metastatic prostate cancer.Pt learned in January that the cancer had spread to his bones. He has metastatic lesions in his spine, hip, and pelvis but no pain. He has been treated for his prostate cancer with radiation as well as the medications Firmagon and Lupron.   Pt fell one week before Sunday. Pt reports approximately 2 falls in the last 12 months. Pt was advised to use a cane by Dr. Manuella Ghazi. He started using the cane intermittently.     Patient Stated Goals  Improve balance    Currently in Pain?  No/denies       Vitals: 179/99 pulse 63   L arm: 181 /87 pulse 57   After seated marches: 159/ 87 pulse 58  Orange hurdle 10x each leg SUE support step over and back.    Side step orange hurdle 10x each leg, SUE support    Modified single limb stance with opp leg on dyna disc (green) 60 seconds each leg   Airex pad: balloon taps 3 minutes   Airex pad 6" step toe taps   Airex pad: eyes closed 30 seconds x 3 trials    5lb ankle weights: Standing marches 10x each leg Abduction 10x, BLE finger tip support Extension 10x each leg; finger tip support; keep upright posture cueing.  Seated: LAQ 10x each leg 3 second holds    Seated hamstring stretch 2x 60 seconds each leg     Stand on half foam roller flat side down 60 seconds                       PT Education - 10/24/17 0758    Education Details  exercise technique, stability     Person(s) Educated  Patient    Methods  Explanation;Demonstration;Verbal cues    Comprehension  Verbalized understanding;Returned demonstration       PT Short Term Goals - 10/11/17 1251      PT SHORT TERM GOAL #1   Title  Pt will be independent with HEP in order to improve strength and balance in order to decrease fall risk and improve function at home and work.     Time  8    Period  Weeks    Status  New    Target Date  12/06/17        PT Long Term  Goals - 10/11/17 1252      PT LONG TERM GOAL #1   Title  Pt will decrease 5TSTS by at least 3 seconds in order to demonstrate clinically significant improvement in LE strength    Baseline  10/11/17: 14.4s    Time  8    Period  Weeks    Status  New    Target Date  12/06/17      PT LONG TERM  GOAL #2   Title  Pt will improve BERG by at least 3 points in order to demonstrate clinically significant improvement in balance.     Baseline  10/11/17: 50/56    Time  8    Period  Weeks    Status  New    Target Date  12/06/17            Plan - 10/24/17 0829    Clinical Impression Statement  Patient demonstrates improved stability with prolonged standing on stable and unstable surfaces. Decrease episodes of LOB. Patient tolerated seated hamstring stretch well with good body mechanics and holding. Pt will benefit from PT services to address deficits in strength, balance, and mobility in order to return to full function at home    Rehab Potential  Good    PT Frequency  2x / week    PT Duration  8 weeks    PT Treatment/Interventions  Aquatic Therapy;ADLs/Self Care Home Management;Canalith Repostioning;Cryotherapy;Electrical Stimulation;Iontophoresis 4mg /ml Dexamethasone;Moist Heat;Traction;Ultrasound;Gait training;Stair training;Functional mobility training;Therapeutic activities;Therapeutic exercise;Balance training;Neuromuscular re-education;Patient/family education;Manual techniques;Vestibular    PT Next Visit Plan  sensation testing, Dix-Hallpike    PT Home Exercise Plan  sit to stand, semitandem/tandem balance    Consulted and Agree with Plan of Care  Patient;Family member/caregiver    Family Member Consulted  Wife       Patient will benefit from skilled therapeutic intervention in order to improve the following deficits and impairments:  Abnormal gait, Decreased strength, Decreased balance  Visit Diagnosis: Difficulty in walking, not elsewhere classified  Muscle weakness  (generalized)     Problem List Patient Active Problem List   Diagnosis Date Noted  . Dizziness 08/12/2017  . Nasal erythema 04/10/2017  . Carotid artery disease ([place]) 08/06/2016  . History of TIA (transient ischemic attack) 07/24/2016  . Sleep apnea 07/24/2016  . COPD (chronic obstructive pulmonary disease) ([place]) 07/24/2016  . CKD (chronic kidney disease) 07/24/2016  . Barrett's esophagus 07/24/2016  . Hyperglycemia 07/24/2016  . Hypercholesterolemia 07/12/2016  . Hypertension, essential 07/12/2016  . CAD (coronary artery disease) 07/12/2016  . Malignant neoplasm of prostate ([place]) 11/15/2015  . Family history of cancer 11/15/2015   Janna Arch, PT, DPT \ 10/24/2017, 8:45 AM  [place] MAIN [facility] SERVICES [address] [place], [place], [address] Phone: [phone]   Fax:  [phone]  Name: Nathaniel Thompson MRN: [id number] Date of Birth: 1935-07-14

## 2017-10-29 ENCOUNTER — Ambulatory Visit: Payer: Medicare HMO

## 2017-10-31 ENCOUNTER — Ambulatory Visit: Payer: Medicare HMO

## 2017-11-01 ENCOUNTER — Other Ambulatory Visit: Payer: Medicare HMO

## 2017-11-01 ENCOUNTER — Ambulatory Visit: Payer: Medicare HMO | Admitting: Internal Medicine

## 2017-11-07 ENCOUNTER — Ambulatory Visit: Payer: Medicare HMO | Attending: Neurology

## 2017-11-07 DIAGNOSIS — M6281 Muscle weakness (generalized): Secondary | ICD-10-CM | POA: Diagnosis not present

## 2017-11-07 DIAGNOSIS — R262 Difficulty in walking, not elsewhere classified: Secondary | ICD-10-CM

## 2017-11-07 NOTE — Therapy (Signed)
[place] MAIN [facility] SERVICES [address] [place], [place], [address] Phone: [phone]   Fax:  [phone]  Physical Therapy Treatment  Patient Details  Name: Nathaniel Thompson MRN: [id number] Date of Birth: June 04, 1935 Referring Provider: Dr. Manuella Ghazi   Encounter Date: 11/07/2017  PT End of Session - 11/07/17 0753    Visit Number  6    Number of Visits  17    Date for PT Re-Evaluation  12/06/17    Authorization Type  progress note 6/10    PT Start Time  0800    PT Stop Time  0845    PT Time Calculation (min)  45 min    Equipment Utilized During Treatment  Gait belt    Activity Tolerance  Patient tolerated treatment well    Behavior During Therapy  [facility] for tasks assessed/performed       Past Medical History:  Diagnosis Date  . Allergic rhinitis   . Barrett's esophagus 10/21/13  . Bone cancer ([place])   . Chronic kidney disease    stones  . Colon polyp, hyperplastic   . COPD (chronic obstructive pulmonary disease) (HCC)    mild COPD. former smoker  . Coronary artery disease   . Diabetes mellitus without complication ([place])   . Diverticulosis   . Fundic gland polyps of stomach, benign   . Gastritis   . GERD (gastroesophageal reflux disease)   . Hypercholesteremia   . Hypertension   . Prostate cancer ([place])   . Prostatism   . Reflux esophagitis   . Renal stones   . Skin cancer    left cheek/lesion excised  . Skin cancer   . Sleep apnea   . TIA (transient ischemic attack)   . TIA (transient ischemic attack)     Past Surgical History:  Procedure Laterality Date  . CARDIAC CATHETERIZATION    . COLONOSCOPY WITH PROPOFOL N/A 09/13/2015   Procedure: COLONOSCOPY WITH PROPOFOL;  Surgeon: Lollie Sails, MD;  Location: [facility] ENDOSCOPY;  Service: Endoscopy;  Laterality: N/A;  . CORONARY ANGIOPLASTY    . ESOPHAGOGASTRODUODENOSCOPY (EGD) WITH PROPOFOL N/A 09/13/2015   Procedure: ESOPHAGOGASTRODUODENOSCOPY (EGD) WITH PROPOFOL;  Surgeon:  Lollie Sails, MD;  Location: [facility] ENDOSCOPY;  Service: Endoscopy;  Laterality: N/A;  . ESOPHAGOGASTRODUODENOSCOPY (EGD) WITH PROPOFOL N/A 12/28/2015   Procedure: ESOPHAGOGASTRODUODENOSCOPY (EGD) WITH PROPOFOL;  Surgeon: Lollie Sails, MD;  Location: [facility] ENDOSCOPY;  Service: Endoscopy;  Laterality: N/A;  . ESOPHAGOGASTRODUODENOSCOPY (EGD) WITH PROPOFOL N/A 06/16/2016   Procedure: ESOPHAGOGASTRODUODENOSCOPY (EGD) WITH PROPOFOL;  Surgeon: Lollie Sails, MD;  Location: [facility] ENDOSCOPY;  Service: Endoscopy;  Laterality: N/A;  . EYE SURGERY  2013   CATARACT EXTRACTION  . GANGLION CYST EXCISION Right 05/18/2017   Procedure: REMOVAL GANGLION CYST ANKLE;  Surgeon: Samara Deist, DPM;  Location: [facility] ORS;  Service: Podiatry;  Laterality: Right;  . heart cath stent    . PROSTATE BIOPSY    . stents     multiple    There were no vitals filed for this visit.  Subjective Assessment - 11/07/17 0752    Subjective  Patient has returned from cruise. Reports that he went to the gym on the bike while on the cruise. Did a lot of yard and repair work yesterday with grandson and is a little sore/stiff from it.     Patient is accompained by:  Family member   Wife   Pertinent History  Patient referred by neurology due to complains of unsteadiness and imbalance.  Pt reports that he blacked out on April 10th, 2019 which is when his recent symptoms started. There is some indication in the medical notes that pt complaining of dizziness however pt reports that his concerns are just related to imbalance. He did see ENT and according to patient's report he was advised that his symptoms were not related to his inner ear. He was referred to cardiology and the workup which was normal. Work up up to this point includes ECHO, Holter monitoring , CT head, MRI brain, and ultrasound of carotids which were essentially unremarkable. MRI brain did show a small area of right inferior frontal gyrus encephalomalacia has developed  since 2013 and is nonspecific. Top differential considerations include the sequelae of prior trauma or small prior infarct. Pt has a history of metastatic prostate cancer.Pt learned in January that the cancer had spread to his bones. He has metastatic lesions in his spine, hip, and pelvis but no pain. He has been treated for his prostate cancer with radiation as well as the medications Firmagon and Lupron.   Pt fell one week before Sunday. Pt reports approximately 2 falls in the last 12 months. Pt was advised to use a cane by Dr. Manuella Ghazi. He started using the cane intermittently.     Patient Stated Goals  Improve balance    Currently in Pain?  No/denies        Vitals: 167/71 pulse 61   Orange hurdle 10x each leg no UE support step over and back.    Side step orange hurdle 10x each leg, no UE support    Modified single limb stance with opp leg on dyna disc (green) 60 seconds each leg   Airex pad: balloon taps 3 minutes   Airex pad: 6" step toe taps 10x each leg    Airex pad: one foot on each pad in modified tandem stance 3x30 seconds each foot in front   5lb ankle weights: Standing marches 10x each leg Hamstring curls 10x each leg BLE Standing hip abduction 10x SUE support cues for keeping toes in line.  Seated: LAQ 10x each leg 3 second holds   Ambulate in hallway with horizontal head turns reading cards 2x86 ft ; CGA no LOB    Standing heel raises 20x with UE assist.    Stand on half foam roller flat side down 3 minutes occasional posterior tilt  eccentric step down 4" step BUE support 10x each leg. Patient requires cueing for widening BOS and neutral alignment of LLE.    Stair negotiation: ascend: reciprocal without UE support, descend: with step to pattern and lunging down with external rotation of LLE. Patient requires cueing for widening BOS and neutral alignment of LLE.                        PT Education - 11/07/17 0752    Education Details  exercise  technique, stability     Person(s) Educated  Patient    Methods  Explanation;Demonstration;Verbal cues    Comprehension  Verbalized understanding;Returned demonstration       PT Short Term Goals - 10/11/17 1251      PT SHORT TERM GOAL #1   Title  Pt will be independent with HEP in order to improve strength and balance in order to decrease fall risk and improve function at home and work.     Time  8    Period  Weeks    Status  New  Target Date  12/06/17        PT Long Term Goals - 10/11/17 1252      PT LONG TERM GOAL #1   Title  Pt will decrease 5TSTS by at least 3 seconds in order to demonstrate clinically significant improvement in LE strength    Baseline  10/11/17: 14.4s    Time  8    Period  Weeks    Status  New    Target Date  12/06/17      PT LONG TERM GOAL #2   Title  Pt will improve BERG by at least 3 points in order to demonstrate clinically significant improvement in balance.     Baseline  10/11/17: 50/56    Time  8    Period  Weeks    Status  New    Target Date  12/06/17            Plan - 11/07/17 9937    Clinical Impression Statement  Patient demonstrating improved stability with single limb stance and decreased need for UE support while performing static and dynamic tasks. Patient fatigues quickly with interventions that require cardiovascular challenge. Pt will benefit from PT services to address deficits in strength, balance, and mobility in order to return to full function at home    Rehab Potential  Good    PT Frequency  2x / week    PT Duration  8 weeks    PT Treatment/Interventions  Aquatic Therapy;ADLs/Self Care Home Management;Canalith Repostioning;Cryotherapy;Electrical Stimulation;Iontophoresis 4mg /ml Dexamethasone;Moist Heat;Traction;Ultrasound;Gait training;Stair training;Functional mobility training;Therapeutic activities;Therapeutic exercise;Balance training;Neuromuscular re-education;Patient/family education;Manual techniques;Vestibular    PT  Next Visit Plan  sensation testing, Dix-Hallpike    PT Home Exercise Plan  sit to stand, semitandem/tandem balance    Consulted and Agree with Plan of Care  Patient;Family member/caregiver    Family Member Consulted  Wife       Patient will benefit from skilled therapeutic intervention in order to improve the following deficits and impairments:  Abnormal gait, Decreased strength, Decreased balance  Visit Diagnosis: Difficulty in walking, not elsewhere classified  Muscle weakness (generalized)     Problem List Patient Active Problem List   Diagnosis Date Noted  . Dizziness 08/12/2017  . Nasal erythema 04/10/2017  . Carotid artery disease ([place]) 08/06/2016  . History of TIA (transient ischemic attack) 07/24/2016  . Sleep apnea 07/24/2016  . COPD (chronic obstructive pulmonary disease) ([place]) 07/24/2016  . CKD (chronic kidney disease) 07/24/2016  . Barrett's esophagus 07/24/2016  . Hyperglycemia 07/24/2016  . Hypercholesterolemia 07/12/2016  . Hypertension, essential 07/12/2016  . CAD (coronary artery disease) 07/12/2016  . Malignant neoplasm of prostate ([place]) 11/15/2015  . Family history of cancer 11/15/2015   Janna Arch, PT, DPT   11/07/2017, 8:56 AM  [place] MAIN [facility] SERVICES [address] [place], [place], [address] Phone: [phone]   Fax:  [phone]  Name: Nathaniel Thompson MRN: [id number] Date of Birth: [birth year]

## 2017-11-08 ENCOUNTER — Inpatient Hospital Stay: Payer: Medicare HMO | Attending: Internal Medicine

## 2017-11-08 ENCOUNTER — Other Ambulatory Visit: Payer: Self-pay

## 2017-11-08 ENCOUNTER — Inpatient Hospital Stay (HOSPITAL_BASED_OUTPATIENT_CLINIC_OR_DEPARTMENT_OTHER): Payer: Medicare HMO | Admitting: Internal Medicine

## 2017-11-08 ENCOUNTER — Encounter: Payer: Self-pay | Admitting: Internal Medicine

## 2017-11-08 VITALS — BP 167/92 | HR 60 | Temp 97.6°F | Resp 20 | Ht 67.5 in | Wt 201.0 lb

## 2017-11-08 DIAGNOSIS — C61 Malignant neoplasm of prostate: Secondary | ICD-10-CM

## 2017-11-08 DIAGNOSIS — Z8051 Family history of malignant neoplasm of kidney: Secondary | ICD-10-CM | POA: Insufficient documentation

## 2017-11-08 DIAGNOSIS — Z800 Family history of malignant neoplasm of digestive organs: Secondary | ICD-10-CM

## 2017-11-08 DIAGNOSIS — Z87891 Personal history of nicotine dependence: Secondary | ICD-10-CM | POA: Diagnosis not present

## 2017-11-08 DIAGNOSIS — C7951 Secondary malignant neoplasm of bone: Secondary | ICD-10-CM

## 2017-11-08 DIAGNOSIS — Z806 Family history of leukemia: Secondary | ICD-10-CM

## 2017-11-08 DIAGNOSIS — Z8049 Family history of malignant neoplasm of other genital organs: Secondary | ICD-10-CM

## 2017-11-08 DIAGNOSIS — R42 Dizziness and giddiness: Secondary | ICD-10-CM

## 2017-11-08 DIAGNOSIS — Z801 Family history of malignant neoplasm of trachea, bronchus and lung: Secondary | ICD-10-CM | POA: Insufficient documentation

## 2017-11-08 DIAGNOSIS — Z5111 Encounter for antineoplastic chemotherapy: Secondary | ICD-10-CM | POA: Diagnosis not present

## 2017-11-08 DIAGNOSIS — I10 Essential (primary) hypertension: Secondary | ICD-10-CM | POA: Diagnosis not present

## 2017-11-08 LAB — CBC WITH DIFFERENTIAL/PLATELET
Basophils Absolute: 0 10*3/uL (ref 0–0.1)
Basophils Relative: 1 %
Eosinophils Absolute: 0.3 10*3/uL (ref 0–0.7)
Eosinophils Relative: 6 %
HEMATOCRIT: 37.9 % — AB (ref 40.0–52.0)
HEMOGLOBIN: 13.1 g/dL (ref 13.0–18.0)
LYMPHS ABS: 1.1 10*3/uL (ref 1.0–3.6)
LYMPHS PCT: 26 %
MCH: 29.9 pg (ref 26.0–34.0)
MCHC: 34.6 g/dL (ref 32.0–36.0)
MCV: 86.5 fL (ref 80.0–100.0)
Monocytes Absolute: 0.5 10*3/uL (ref 0.2–1.0)
Monocytes Relative: 12 %
NEUTROS ABS: 2.5 10*3/uL (ref 1.4–6.5)
NEUTROS PCT: 55 %
Platelets: 193 10*3/uL (ref 150–440)
RBC: 4.38 MIL/uL — AB (ref 4.40–5.90)
RDW: 13.8 % (ref 11.5–14.5)
WBC: 4.4 10*3/uL (ref 3.8–10.6)

## 2017-11-08 LAB — COMPREHENSIVE METABOLIC PANEL
ALT: 17 U/L (ref 0–44)
AST: 26 U/L (ref 15–41)
Albumin: 3.6 g/dL (ref 3.5–5.0)
Alkaline Phosphatase: 55 U/L (ref 38–126)
Anion gap: 5 (ref 5–15)
BUN: 20 mg/dL (ref 8–23)
CHLORIDE: 108 mmol/L (ref 98–111)
CO2: 26 mmol/L (ref 22–32)
Calcium: 9.3 mg/dL (ref 8.9–10.3)
Creatinine, Ser: 1.12 mg/dL (ref 0.61–1.24)
GFR calc Af Amer: >60 mL/min (ref >60)
Glucose, Bld: 114 mg/dL — ABNORMAL HIGH (ref 70–99)
POTASSIUM: 3.7 mmol/L (ref 3.5–5.1)
Sodium: 139 mmol/L (ref 135–145)
Total Bilirubin: 0.8 mg/dL (ref 0.3–1.2)
Total Protein: 6.6 g/dL (ref 6.5–8.1)

## 2017-11-08 LAB — PSA: PROSTATIC SPECIFIC ANTIGEN: 0.01 ng/mL (ref 0.00–4.00)

## 2017-11-08 NOTE — Progress Notes (Signed)
Nathaniel Thompson OFFICE PROGRESS NOTE  Patient Care Team: Einar Pheasant, MD as PCP - General (Internal Medicine)  Cancer Staging No matching staging information was found for the patient.   Oncology History   Nov 2017- completed IM RT radiation therapy to his prostate and pelvic nodes for Gleason 7 (4+3) adenocarcinoma the prostate presenting the PSA of 7.8.  # JAN 2019- METASTATIC PROSTATE CA to Bone [low volume met on bone scan; CT- NED]; PSA ~50; March 25th 2019- Lupron 45 mg IM [Urology q 11M];  # May 23rd Zytiga 24m/day; no prednisone   # OBenita Thompson [place]]  -------------------------------------------------------------------------  DIAGNOSIS: [ JAN 2019]- Met- PROSTATE CANCER  STAGE:  IV       ;GOALS: PALLIATIVE  CURRENT/MOST RECENT THERAPY [ march 2019]- Lupron; May 23rd 2019- Zytiga 2528mday      Malignant neoplasm of prostate ([place]     INTERVAL HISTORY:  [name]89.o.  male pleasant patient above history of metastatic prostate cancer on ADT/Zytiga is here for follow-up.  Patient continues to have intermittent dizzy spells.  He has been evaluated by neurology.  Is currently undergoing physical therapy.  Denies any swelling in the legs.  Denies any nausea vomiting.  Review of Systems  Constitutional: Negative for chills, diaphoresis, fever, malaise/fatigue and weight loss.  HENT: Negative for nosebleeds and sore throat.   Eyes: Negative for double vision.  Respiratory: Negative for cough, hemoptysis, sputum production, shortness of breath and wheezing.   Cardiovascular: Negative for chest pain, palpitations, orthopnea and leg swelling.  Gastrointestinal: Negative for abdominal pain, blood in stool, constipation, diarrhea, heartburn, melena, nausea and vomiting.  Genitourinary: Negative for dysuria, frequency and urgency.  Musculoskeletal: Negative for back pain and joint pain.  Skin: Negative.  Negative for itching and rash.   Neurological: Positive for dizziness. Negative for tingling, focal weakness, weakness and headaches.  Endo/Heme/Allergies: Does not bruise/bleed easily.  Psychiatric/Behavioral: Negative for depression. The patient is not nervous/anxious and does not have insomnia.       PAST MEDICAL HISTORY :  Past Medical History:  Diagnosis Date  . Allergic rhinitis   . Barrett's esophagus 10/21/13  . Bone cancer ([place]  . Chronic kidney disease    stones  . Colon polyp, hyperplastic   . COPD (chronic obstructive pulmonary disease) (HCC)    mild COPD. former smoker  . Coronary artery disease   . Diabetes mellitus without complication ([place]  . Diverticulosis   . Fundic gland polyps of stomach, benign   . Gastritis   . GERD (gastroesophageal reflux disease)   . Hypercholesteremia   . Hypertension   . Prostate cancer ([facility]  . Prostatism   . Reflux esophagitis   . Renal stones   . Skin cancer    left cheek/lesion excised  . Skin cancer   . Sleep apnea   . TIA (transient ischemic attack)   . TIA (transient ischemic attack)     PAST SURGICAL HISTORY :   Past Surgical History:  Procedure Laterality Date  . CARDIAC CATHETERIZATION    . COLONOSCOPY WITH PROPOFOL N/A 09/13/2015   Procedure: COLONOSCOPY WITH PROPOFOL;  Surgeon: MaLollie SailsMD;  [facility];  Service: Endoscopy;  Laterality: N/A;  . CORONARY ANGIOPLASTY    . ESOPHAGOGASTRODUODENOSCOPY (EGD) WITH PROPOFOL N/A 09/13/2015   Procedure: ESOPHAGOGASTRODUODENOSCOPY (EGD) WITH PROPOFOL;  Surgeon: MaLollie SailsMD;  [facility];  Service: Endoscopy;  Laterality: N/A;  . ESOPHAGOGASTRODUODENOSCOPY (EGD)  WITH PROPOFOL N/A 12/28/2015   Procedure: ESOPHAGOGASTRODUODENOSCOPY (EGD) WITH PROPOFOL;  Surgeon: Lollie Sails, MD;  Location[facility] ENDOSCOPY;  Service: Endoscopy;  Laterality: N/A;  . ESOPHAGOGASTRODUODENOSCOPY (EGD) WITH PROPOFOL N/A 06/16/2016   Procedure: ESOPHAGOGASTRODUODENOSCOPY (EGD)  WITH PROPOFOL;  Surgeon: Lollie Sails, MD;  Location: [facility] ENDOSCOPY;  Service: Endoscopy;  Laterality: N/A;  . EYE SURGERY  2013   CATARACT EXTRACTION  . GANGLION CYST EXCISION Right 05/18/2017   Procedure: REMOVAL GANGLION CYST ANKLE;  Surgeon: Samara Deist, DPM;  Location: [facility] ORS;  Service: Podiatry;  Laterality: Right;  . heart cath stent    . PROSTATE BIOPSY    . stents     multiple    FAMILY HISTORY :   Family History  Problem Relation Age of Onset  . Cancer Mother        gastric and lung  . Cancer Father        multiple myeloma  . Stroke Father   . Cancer Sister        leukemia  . Cancer Brother        leukemia  . Cancer Brother        kidney  . Cancer Daughter        Uterine  . Cancer Other        [place] (Sister's Son): Prostate    SOCIAL HISTORY:   Social History   Tobacco Use  . Smoking status: Former Smoker    Packs/day: 1.00    Years: 35.00    Pack years: 35.00    Types: Cigarettes    Last attempt to quit: 03/06/1986    Years since quitting: 31.7  . Smokeless tobacco: Former Systems developer    Types: Chew  Substance Use Topics  . Alcohol use: No  . Drug use: No    ALLERGIES:  is allergic to atorvastatin.  MEDICATIONS:  Current Outpatient Medications  Medication Sig Dispense Refill  . abiraterone acetate (ZYTIGA) 250 MG tablet Take 250 mg by mouth daily.    [name] aspirin 81 MG tablet Take 81 mg by mouth daily.    . calcium citrate-vitamin D (CITRACAL+D) 315-200 MG-UNIT tablet Take 1 tablet by mouth daily.    . cholecalciferol (VITAMIN D) 1000 units tablet Take 1,000 Units by mouth daily.     . clopidogrel (PLAVIX) 75 MG tablet Take 1 tablet (75 mg total) by mouth daily. 90 tablet 0  . fexofenadine (ALLEGRA) 180 MG tablet Take 180 mg by mouth daily as needed for allergies or rhinitis.    . fluticasone (FLONASE) 50 MCG/ACT nasal spray Place 2 sprays into both nostrils daily.    . hydrochlorothiazide (HYDRODIURIL) 12.5 MG tablet Take 1 tablet (12.5 mg  total) by mouth daily. In AM. 90 tablet 1  . isosorbide mononitrate (IMDUR) 30 MG 24 hr tablet TAKE 1 TABLET BY MOUTH ONCE DAILY 90 tablet 2  . losartan (COZAAR) 50 MG tablet Take 50 mg by mouth daily.    [name] lovastatin (MEVACOR) 40 MG tablet TAKE 2 TABLETS BY MOUTH AT BEDTIME 180 tablet 1  . metoprolol succinate (TOPROL-XL) 25 MG 24 hr tablet Take 25 mg by mouth daily.    . Multiple Vitamin (MULTI-VITAMINS) TABS Take 1 tablet by mouth daily.     . pantoprazole (PROTONIX) 40 MG tablet Take 40 mg by mouth 2 (two) times daily.     . Polyethylene Glycol 3350 (MIRALAX PO) Take 1 packet by mouth.    . ranitidine (ZANTAC) 150 MG tablet Take 150  mg by mouth daily.     . Ipratropium-Albuterol (COMBIVENT) 20-100 MCG/ACT AERS respimat 2 puffs qid prn (Patient not taking: Reported on 07/26/2017) 1 Inhaler 3  . nitroGLYCERIN (NITROSTAT) 0.4 MG SL tablet Place 0.4 mg under the tongue every 5 (five) minutes as needed for chest pain.     No current facility-administered medications for this visit.     PHYSICAL EXAMINATION: ECOG PERFORMANCE STATUS: 0 - Asymptomatic  BP (!) 167/92 (Patient Position: Sitting)   Pulse 60   Temp 97.6 F (36.4 C) (Tympanic)   Resp 20   Ht 5' 7.5" (1.715 m)   Wt 201 lb (91.2 kg)   BMI 31.02 kg/m   Filed Weights   11/08/17 0955  Weight: 201 lb (91.2 kg)    Physical Exam  Constitutional: He is oriented to person, place, and time and well-developed, well-nourished, and in no distress.  He is accompanied by his wife.  Is walking himself.  HENT:  Head: Normocephalic and atraumatic.  Mouth/Throat: Oropharynx is clear and moist. No oropharyngeal exudate.  Eyes: Pupils are equal, round, and reactive to light.  Neck: Normal range of motion. Neck supple.  Cardiovascular: Normal rate and regular rhythm.  Pulmonary/Chest: No respiratory distress. He has no wheezes.  Abdominal: Soft. Bowel sounds are normal. He exhibits no distension and no mass. There is no tenderness. There  is no rebound and no guarding.  Musculoskeletal: Normal range of motion. He exhibits no edema or tenderness.  Neurological: He is alert and oriented to person, place, and time.  Skin: Skin is warm.  Psychiatric: Affect normal.      LABORATORY DATA:  I have reviewed the data as listed    Component Value Date/Time   NA 139 11/08/2017 0932   NA 140 09/16/2013 0438   K 3.7 11/08/2017 0932   K 4.2 09/16/2013 0438   CL 108 11/08/2017 0932   CL 109 (H) 09/16/2013 0438   CO2 26 11/08/2017 0932   CO2 24 09/16/2013 0438   GLUCOSE 114 (H) 11/08/2017 0932   GLUCOSE 101 (H) 09/16/2013 0438   BUN 20 11/08/2017 0932   BUN 20 (H) 09/16/2013 0438   CREATININE 1.12 11/08/2017 0932   CREATININE 1.28 09/16/2013 0438   CALCIUM 9.3 11/08/2017 0932   CALCIUM 8.3 (L) 09/16/2013 0438   PROT 6.6 11/08/2017 0932   PROT 6.7 09/15/2013 0755   ALBUMIN 3.6 11/08/2017 0932   ALBUMIN 3.5 09/15/2013 0755   AST 26 11/08/2017 0932   AST 37 09/15/2013 0755   ALT 17 11/08/2017 0932   ALT 28 09/15/2013 0755   ALKPHOS 55 11/08/2017 0932   ALKPHOS 44 (L) 09/15/2013 0755   BILITOT 0.8 11/08/2017 0932   BILITOT 0.5 09/15/2013 0755   GFRNONAA >60 11/08/2017 0932   GFRNONAA 54 (L) 09/16/2013 0438   GFRAA >60 11/08/2017 0932   GFRAA >60 09/16/2013 0438    No results found for: SPEP, UPEP  Lab Results  Component Value Date   WBC 4.4 11/08/2017   NEUTROABS 2.5 11/08/2017   HGB 13.1 11/08/2017   HCT 37.9 (L) 11/08/2017   MCV 86.5 11/08/2017   PLT 193 11/08/2017      Chemistry      Component Value Date/Time   NA 139 11/08/2017 0932   NA 140 09/16/2013 0438   K 3.7 11/08/2017 0932   K 4.2 09/16/2013 0438   CL 108 11/08/2017 0932   CL 109 (H) 09/16/2013 0438   CO2 26 11/08/2017 0932   CO2  24 09/16/2013 0438   BUN 20 11/08/2017 0932   BUN 20 (H) 09/16/2013 0438   CREATININE 1.12 11/08/2017 0932   CREATININE 1.28 09/16/2013 0438      Component Value Date/Time   CALCIUM 9.3 11/08/2017 0932    CALCIUM 8.3 (L) 09/16/2013 0438   ALKPHOS 55 11/08/2017 0932   ALKPHOS 44 (L) 09/15/2013 0755   AST 26 11/08/2017 0932   AST 37 09/15/2013 0755   ALT 17 11/08/2017 0932   ALT 28 09/15/2013 0755   BILITOT 0.8 11/08/2017 0932   BILITOT 0.5 09/15/2013 0755       RADIOGRAPHIC STUDIES: I have personally reviewed the radiological images as listed and agreed with the findings in the report. No results found.   ASSESSMENT & PLAN:  Malignant neoplasm of prostate ([place]) #Castrate sensitive-metastatic prostate cancer to the bone. Stage IV-on ADT/Zytiga low-dose. improving PSA.  STABLE.  Lupron every 6 months [cancer center]  #Continue Zytiga 250 mg once a day with food.  # Elevated blood pressure-systolic blood pressure 682/57; STABLE. Discontinue HCTZ. Stop taking your hydrochlorothiazide; keep a log of your blood pressures on a daily basis; and take it to your PCPs appointment.  #Intermittent dizzy spells-STABLE.s/p evaluation by Dr.Shah/Physical therapy.    # follow up in 2 months/ labs-PSA [preference]; Lupron on sep 23rd [early afternoon].   Cc; Dr.Scott.    Orders Placed This Encounter  Procedures  . CBC with Differential    Standing Status:   Future    Standing Expiration Date:   11/09/2018  . Comprehensive metabolic panel    Standing Status:   Future    Standing Expiration Date:   11/09/2018  . PSA    Standing Status:   Future    Standing Expiration Date:   11/09/2018   All questions were answered. The patient knows to call the clinic with any problems, questions or concerns.      Cammie Sickle, MD 11/16/2017 10:04 AM

## 2017-11-08 NOTE — Patient Instructions (Signed)
#  Stop taking your hydrochlorothiazide; keep a log of your blood pressures on a daily basis; and take it to your PCPs appointment.

## 2017-11-08 NOTE — Assessment & Plan Note (Addendum)
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 Stage IV-on ADT/Zytiga low-dose. improving PSA.  STABLE.  Lupron every 6 months [cancer center]  #Continue Zytiga 250 mg once a day with food.  # Elevated blood pressure-systolic blood pressure 623/76; STABLE. Discontinue HCTZ. Stop taking your hydrochlorothiazide; keep a log of your blood pressures on a daily basis; and take it to your PCPs appointment.  #Intermittent dizzy spells-STABLE.s/p evaluation by Dr.Shah/Physical therapy.    # follow up in 2 months/ labs-PSA [preference]; Lupron on sep 23rd [early afternoon].   Cc; Dr.Scott.

## 2017-11-12 ENCOUNTER — Ambulatory Visit: Payer: Medicare HMO

## 2017-11-12 DIAGNOSIS — M6281 Muscle weakness (generalized): Secondary | ICD-10-CM

## 2017-11-12 DIAGNOSIS — R262 Difficulty in walking, not elsewhere classified: Secondary | ICD-10-CM | POA: Diagnosis not present

## 2017-11-12 NOTE — Therapy (Addendum)
[place] MAIN [facility] SERVICES [address] [place], [place], [address] Phone: [phone]   Fax:  [phone]  Physical Therapy Treatment/Discharge  Patient Details  Name: Nathaniel Thompson MRN: [id number] Date of Birth: [birth year] Referring Provider: Dr. Manuella Ghazi   Encounter Date: 11/12/2017  PT End of Session - 11/12/17 1335    Visit Number  7    Number of Visits  17    Date for PT Re-Evaluation  12/06/17    Authorization Type  progress note 7/10    PT Start Time  0800    PT Stop Time  0842    PT Time Calculation (min)  42 min    Equipment Utilized During Treatment  Gait belt    Activity Tolerance  Patient tolerated treatment well    Behavior During Therapy  [facility] for tasks assessed/performed       Past Medical History:  Diagnosis Date  . Allergic rhinitis   . Barrett's esophagus 10/21/13  . Bone cancer ([place])   . Chronic kidney disease    stones  . Colon polyp, hyperplastic   . COPD (chronic obstructive pulmonary disease) (HCC)    mild COPD. former smoker  . Coronary artery disease   . Diabetes mellitus without complication ([place])   . Diverticulosis   . Fundic gland polyps of stomach, benign   . Gastritis   . GERD (gastroesophageal reflux disease)   . Hypercholesteremia   . Hypertension   . Prostate cancer ([place])   . Prostatism   . Reflux esophagitis   . Renal stones   . Skin cancer    left cheek/lesion excised  . Skin cancer   . Sleep apnea   . TIA (transient ischemic attack)   . TIA (transient ischemic attack)     Past Surgical History:  Procedure Laterality Date  . CARDIAC CATHETERIZATION    . COLONOSCOPY WITH PROPOFOL N/A 09/13/2015   Procedure: COLONOSCOPY WITH PROPOFOL;  Surgeon: Lollie Sails, MD;  Location[facility] ENDOSCOPY;  Service: Endoscopy;  Laterality: N/A;  . CORONARY ANGIOPLASTY    . ESOPHAGOGASTRODUODENOSCOPY (EGD) WITH PROPOFOL N/A 09/13/2015   Procedure: ESOPHAGOGASTRODUODENOSCOPY (EGD) WITH PROPOFOL;   Surgeon: Lollie Sails, MD;  Location: [facility] ENDOSCOPY;  Service: Endoscopy;  Laterality: N/A;  . ESOPHAGOGASTRODUODENOSCOPY (EGD) WITH PROPOFOL N/A 12/28/2015   Procedure: ESOPHAGOGASTRODUODENOSCOPY (EGD) WITH PROPOFOL;  Surgeon: Lollie Sails, MD;  Location[facility] ENDOSCOPY;  Service: Endoscopy;  Laterality: N/A;  . ESOPHAGOGASTRODUODENOSCOPY (EGD) WITH PROPOFOL N/A 06/16/2016   Procedure: ESOPHAGOGASTRODUODENOSCOPY (EGD) WITH PROPOFOL;  Surgeon: Lollie Sails, MD;  Location: [facility] ENDOSCOPY;  Service: Endoscopy;  Laterality: N/A;  . EYE SURGERY  2013   CATARACT EXTRACTION  . GANGLION CYST EXCISION Right 05/18/2017   Procedure: REMOVAL GANGLION CYST ANKLE;  Surgeon: Samara Deist, DPM;  Location: [facility] ORS;  Service: Podiatry;  Laterality: Right;  . heart cath stent    . PROSTATE BIOPSY    . stents     multiple    There were no vitals filed for this visit.  Subjective Assessment - 11/12/17 0835    Subjective  Patient reports he has been able to close his eyes in the shower. No LOB or falls since last session. Did not sleep well last night due to urinary frequency. Patient reports he feels much more steady, only stairs are challenging.     Patient is accompained by:  Family member   Wife   Pertinent History  Patient referred by neurology due to complains  of unsteadiness and imbalance. Pt reports that he blacked out on April 10th, 2019 which is when his recent symptoms started. There is some indication in the medical notes that pt complaining of dizziness however pt reports that his concerns are just related to imbalance. He did see ENT and according to patient's report he was advised that his symptoms were not related to his inner ear. He was referred to cardiology and the workup which was normal. Work up up to this point includes ECHO, Holter monitoring , CT head, MRI brain, and ultrasound of carotids which were essentially unremarkable. MRI brain did show a small area of right inferior  frontal gyrus encephalomalacia has developed since 2013 and is nonspecific. Top differential considerations include the sequelae of prior trauma or small prior infarct. Pt has a history of metastatic prostate cancer.Pt learned in January that the cancer had spread to his bones. He has metastatic lesions in his spine, hip, and pelvis but no pain. He has been treated for his prostate cancer with radiation as well as the medications Firmagon and Lupron.   Pt fell one week before Sunday. Pt reports approximately 2 falls in the last 12 months. Pt was advised to use a cane by Dr. Manuella Ghazi. He started using the cane intermittently.     Patient Stated Goals  Improve balance    Currently in Pain?  No/denies       Vitals: 180/85 but laughed during taking vitals:  Vitals not laughing: 164/73 pulse 55  5x STS: 9 seconds BERG= 55/56   Stair negotiation: ascend: reciprocal without UE support, descend: with step to pattern and lunging down with external rotation of LLE. Patient requires cueing for widening BOS and neutral alignment of LLE.   Eccentric step down 4" step BUE support cues for heel strike and alignment of foot. 10x each leg.  [facility] PT Assessment - 11/12/17 0001      Berg Balance Test   Sit to Stand  Able to stand without using hands and stabilize independently    Standing Unsupported  Able to stand safely 2 minutes    Sitting with Back Unsupported but Feet Supported on Floor or Stool  Able to sit safely and securely 2 minutes    Stand to Sit  Sits safely with minimal use of hands    Transfers  Able to transfer safely, minor use of hands    Standing Unsupported with Eyes Closed  Able to stand 10 seconds safely    Standing Ubsupported with Feet Together  Able to place feet together independently and stand 1 minute safely    From Standing, Reach Forward with Outstretched Arm  Can reach confidently >25 cm (10")    From Standing Position, Pick up Object from Floor  Able to pick up shoe safely and easily     From Standing Position, Turn to Look Behind Over each Shoulder  Looks behind from both sides and weight shifts well    Turn 360 Degrees  Able to turn 360 degrees safely in 4 seconds or less    Standing Unsupported, Alternately Place Feet on Step/Stool  Able to stand independently and safely and complete 8 steps in 20 seconds    Standing Unsupported, One Foot in Front  Able to place foot tandem independently and hold 30 seconds    Standing on One Leg  Able to lift leg independently and hold 5-10 seconds    Total Score  55       Ambulate in hallway  with horizontal head turns reading cards 2x86 ft ; CGA no LOB   ambulate in hallway with cues for horizontal and vertical 2x 86 ft. CGA, no LOB   Orange hurdle 15x each leg no UE support step over and back.    Side step orange hurdle 15x each leg, no UE support   Balloon taps on airex pad: 3 minutes: reaching inside and outside BOS   airex pad eyes closed: 3x 30 second holds, no LOB                   PT Education - 11/12/17 0836    Education Details  exercise technique , d/c    Person(s) Educated  Patient    Methods  Explanation    Comprehension  Verbalized understanding       PT Short Term Goals - 11/12/17 1337      PT SHORT TERM GOAL #1   Title  Pt will be independent with HEP in order to improve strength and balance in order to decrease fall risk and improve function at home and work.     Baseline  HEP compliant    Time  8    Period  Weeks    Status  Achieved        PT Long Term Goals - 11/12/17 0786      PT LONG TERM GOAL #1   Title  Pt will decrease 5TSTS by at least 3 seconds in order to demonstrate clinically significant improvement in LE strength    Baseline  10/11/17: 14.4s 9/9: 9 seconds    Time  8    Period  Weeks    Status  Achieved      PT LONG TERM GOAL #2   Title  Pt will improve BERG by at least 3 points in order to demonstrate clinically significant improvement in balance.     Baseline   10/11/17: 50/56 9/9: 55/56     Time  8    Period  Weeks    Status  Achieved            Plan - 11/12/17 1337    Clinical Impression Statement  Patient has met all goals at this time with BERG increasing to 55/56 and 5x STS performed in 9 seconds. Patient reports only having difficulty going down stairs at this time, upon which we reviewed safe negotiation of stairs. I will be happy to see patient again in the future as needed.     Rehab Potential  Good    PT Frequency  2x / week    PT Duration  8 weeks    PT Treatment/Interventions  Aquatic Therapy;ADLs/Self Care Home Management;Canalith Repostioning;Cryotherapy;Electrical Stimulation;Iontophoresis 79m/ml Dexamethasone;Moist Heat;Traction;Ultrasound;Gait training;Stair training;Functional mobility training;Therapeutic activities;Therapeutic exercise;Balance training;Neuromuscular re-education;Patient/family education;Manual techniques;Vestibular    PT Next Visit Plan  sensation testing, [place]pike    PT Home Exercise Plan  sit to stand, semitandem/tandem balance    Consulted and Agree with Plan of Care  Patient;Family member/caregiver    Family Member Consulted  Wife       Patient will benefit from skilled therapeutic intervention in order to improve the following deficits and impairments:  Abnormal gait, Decreased strength, Decreased balance  Visit Diagnosis: Difficulty in walking, not elsewhere classified  Muscle weakness (generalized)     Problem List Patient Active Problem List   Diagnosis Date Noted  . Dizziness 08/12/2017  . Nasal erythema 04/10/2017  . Carotid artery disease ([place] 08/06/2016  . History of TIA (transient  ischemic attack) 07/24/2016  . Sleep apnea 07/24/2016  . COPD (chronic obstructive pulmonary disease) ([place]) 07/24/2016  . CKD (chronic kidney disease) 07/24/2016  . Barrett's esophagus 07/24/2016  . Hyperglycemia 07/24/2016  . Hypercholesterolemia 07/12/2016  . Hypertension, essential 07/12/2016   . CAD (coronary artery disease) 07/12/2016  . Malignant neoplasm of prostate ([place]) 11/15/2015  . Family history of cancer 11/15/2015   Janna Arch, PT, DPT   11/12/2017, 1:38 PM  [place] MAIN [facility] SERVICES [address] [place], [place], [address] Phone: [phone]   Fax:  [phone]  Name: Nathaniel Thompson MRN: [id number] Date of Birth: May 11, 1935

## 2017-11-14 ENCOUNTER — Ambulatory Visit: Payer: Medicare HMO

## 2017-11-19 ENCOUNTER — Ambulatory Visit: Payer: Medicare HMO

## 2017-11-21 ENCOUNTER — Ambulatory Visit: Payer: Medicare HMO

## 2017-11-22 ENCOUNTER — Telehealth: Payer: Self-pay | Admitting: Pharmacist

## 2017-11-22 NOTE — Telephone Encounter (Signed)
Oral Chemotherapy Pharmacist Encounter  Follow-Up Form  Called patient today to follow up regarding patient's oral chemotherapy medication: Zytiga  Original Start date of oral chemotherapy: 07/2017  Pt reports 0 tablets/doses of Zytiga missed in the last month.   Pt reports the following side effects: None reported  Recent labs reviewed: PSA from 11/08/2017  New medications?: None reported  Other Issues: Patient reports discontinuing hydrochlorothiazide per Dr. Aletha Halim instruction on 11/08/17.  Patient knows to call the office with questions or concerns. Oral Oncology Clinic will continue to follow.  Darl Pikes, PharmD, BCPS, [facility] Hematology/Oncology Clinical Pharmacist [facility] [place] [facility] [phone]  11/22/2017 4:08 PM

## 2017-11-26 ENCOUNTER — Inpatient Hospital Stay: Payer: Medicare HMO

## 2017-11-26 ENCOUNTER — Ambulatory Visit: Payer: Medicare HMO

## 2017-11-26 ENCOUNTER — Ambulatory Visit: Payer: Medicare HMO | Admitting: Internal Medicine

## 2017-11-26 DIAGNOSIS — Z801 Family history of malignant neoplasm of trachea, bronchus and lung: Secondary | ICD-10-CM | POA: Diagnosis not present

## 2017-11-26 DIAGNOSIS — I4891 Unspecified atrial fibrillation: Secondary | ICD-10-CM | POA: Diagnosis not present

## 2017-11-26 DIAGNOSIS — Z800 Family history of malignant neoplasm of digestive organs: Secondary | ICD-10-CM | POA: Diagnosis not present

## 2017-11-26 DIAGNOSIS — Z806 Family history of leukemia: Secondary | ICD-10-CM | POA: Diagnosis not present

## 2017-11-26 DIAGNOSIS — R42 Dizziness and giddiness: Secondary | ICD-10-CM | POA: Diagnosis not present

## 2017-11-26 DIAGNOSIS — Z8051 Family history of malignant neoplasm of kidney: Secondary | ICD-10-CM | POA: Diagnosis not present

## 2017-11-26 DIAGNOSIS — R079 Chest pain, unspecified: Secondary | ICD-10-CM | POA: Diagnosis not present

## 2017-11-26 DIAGNOSIS — Z5111 Encounter for antineoplastic chemotherapy: Secondary | ICD-10-CM | POA: Diagnosis not present

## 2017-11-26 DIAGNOSIS — C7951 Secondary malignant neoplasm of bone: Secondary | ICD-10-CM | POA: Diagnosis not present

## 2017-11-26 DIAGNOSIS — C61 Malignant neoplasm of prostate: Secondary | ICD-10-CM

## 2017-11-26 DIAGNOSIS — I10 Essential (primary) hypertension: Secondary | ICD-10-CM | POA: Diagnosis not present

## 2017-11-26 DIAGNOSIS — Z87891 Personal history of nicotine dependence: Secondary | ICD-10-CM | POA: Diagnosis not present

## 2017-11-26 MED ORDER — LEUPROLIDE ACETATE (6 MONTH) 45 MG IM KIT
45.0000 mg | PACK | Freq: Once | INTRAMUSCULAR | Status: AC
  Administered 2017-11-26: 45 mg via INTRAMUSCULAR
  Filled 2017-11-26: qty 45

## 2017-11-27 ENCOUNTER — Emergency Department: Payer: Medicare HMO

## 2017-11-27 ENCOUNTER — Other Ambulatory Visit: Payer: Self-pay

## 2017-11-27 ENCOUNTER — Observation Stay
Admission: EM | Admit: 2017-11-27 | Discharge: 2017-11-28 | Disposition: A | Discharge status: Home / Self Care | Payer: Medicare HMO | Attending: Specialist | Admitting: Specialist

## 2017-11-27 ENCOUNTER — Observation Stay
Admit: 2017-11-27 | Discharge: 2017-11-27 | Disposition: A | Discharge status: Home / Self Care | Payer: Medicare HMO | Attending: Internal Medicine | Admitting: Internal Medicine

## 2017-11-27 ENCOUNTER — Ambulatory Visit: Payer: Medicare HMO | Admitting: Internal Medicine

## 2017-11-27 ENCOUNTER — Telehealth: Payer: Self-pay

## 2017-11-27 DIAGNOSIS — Z85828 Personal history of other malignant neoplasm of skin: Secondary | ICD-10-CM | POA: Insufficient documentation

## 2017-11-27 DIAGNOSIS — K219 Gastro-esophageal reflux disease without esophagitis: Secondary | ICD-10-CM | POA: Diagnosis not present

## 2017-11-27 DIAGNOSIS — E785 Hyperlipidemia, unspecified: Secondary | ICD-10-CM | POA: Diagnosis not present

## 2017-11-27 DIAGNOSIS — J449 Chronic obstructive pulmonary disease, unspecified: Secondary | ICD-10-CM | POA: Insufficient documentation

## 2017-11-27 DIAGNOSIS — Z7982 Long term (current) use of aspirin: Secondary | ICD-10-CM | POA: Diagnosis not present

## 2017-11-27 DIAGNOSIS — Z7902 Long term (current) use of antithrombotics/antiplatelets: Secondary | ICD-10-CM | POA: Insufficient documentation

## 2017-11-27 DIAGNOSIS — Z79899 Other long term (current) drug therapy: Secondary | ICD-10-CM | POA: Diagnosis not present

## 2017-11-27 DIAGNOSIS — Z8546 Personal history of malignant neoplasm of prostate: Secondary | ICD-10-CM | POA: Diagnosis not present

## 2017-11-27 DIAGNOSIS — R079 Chest pain, unspecified: Secondary | ICD-10-CM | POA: Diagnosis not present

## 2017-11-27 DIAGNOSIS — E7800 Pure hypercholesterolemia, unspecified: Secondary | ICD-10-CM | POA: Diagnosis not present

## 2017-11-27 DIAGNOSIS — J309 Allergic rhinitis, unspecified: Secondary | ICD-10-CM | POA: Diagnosis not present

## 2017-11-27 DIAGNOSIS — Z7951 Long term (current) use of inhaled steroids: Secondary | ICD-10-CM | POA: Diagnosis not present

## 2017-11-27 DIAGNOSIS — G4730 Sleep apnea, unspecified: Secondary | ICD-10-CM | POA: Insufficient documentation

## 2017-11-27 DIAGNOSIS — Z8673 Personal history of transient ischemic attack (TIA), and cerebral infarction without residual deficits: Secondary | ICD-10-CM | POA: Insufficient documentation

## 2017-11-27 DIAGNOSIS — E119 Type 2 diabetes mellitus without complications: Secondary | ICD-10-CM | POA: Diagnosis not present

## 2017-11-27 DIAGNOSIS — Z955 Presence of coronary angioplasty implant and graft: Secondary | ICD-10-CM | POA: Diagnosis not present

## 2017-11-27 DIAGNOSIS — Z87891 Personal history of nicotine dependence: Secondary | ICD-10-CM | POA: Insufficient documentation

## 2017-11-27 DIAGNOSIS — I2510 Atherosclerotic heart disease of native coronary artery without angina pectoris: Secondary | ICD-10-CM | POA: Insufficient documentation

## 2017-11-27 DIAGNOSIS — I4891 Unspecified atrial fibrillation: Secondary | ICD-10-CM | POA: Diagnosis not present

## 2017-11-27 DIAGNOSIS — I10 Essential (primary) hypertension: Secondary | ICD-10-CM | POA: Insufficient documentation

## 2017-11-27 DIAGNOSIS — Z85830 Personal history of malignant neoplasm of bone: Secondary | ICD-10-CM | POA: Insufficient documentation

## 2017-11-27 LAB — BASIC METABOLIC PANEL
ANION GAP: 6 (ref 5–15)
BUN: 19 mg/dL (ref 8–23)
CALCIUM: 9.2 mg/dL (ref 8.9–10.3)
CO2: 23 mmol/L (ref 22–32)
CREATININE: 1.01 mg/dL (ref 0.61–1.24)
Chloride: 111 mmol/L (ref 98–111)
Glucose, Bld: 123 mg/dL — ABNORMAL HIGH (ref 70–99)
Potassium: 3.4 mmol/L — ABNORMAL LOW (ref 3.5–5.1)
SODIUM: 140 mmol/L (ref 135–145)

## 2017-11-27 LAB — TROPONIN I: Troponin I: <0.03 ng/mL (ref <0.03)

## 2017-11-27 LAB — CBC
HCT: 39.7 % — ABNORMAL LOW (ref 40.0–52.0)
HEMOGLOBIN: 13.7 g/dL (ref 13.0–18.0)
MCH: 29.9 pg (ref 26.0–34.0)
MCHC: 34.5 g/dL (ref 32.0–36.0)
MCV: 86.5 fL (ref 80.0–100.0)
PLATELETS: 182 10*3/uL (ref 150–440)
RBC: 4.59 MIL/uL (ref 4.40–5.90)
RDW: 14 % (ref 11.5–14.5)
WBC: 5.4 10*3/uL (ref 3.8–10.6)

## 2017-11-27 LAB — ECHOCARDIOGRAM COMPLETE
Height: 69.5 in
Weight: 3200 oz

## 2017-11-27 LAB — TSH: TSH: 2.864 u[IU]/mL (ref 0.350–4.500)

## 2017-11-27 LAB — MAGNESIUM: MAGNESIUM: 1.7 mg/dL (ref 1.7–2.4)

## 2017-11-27 MED ORDER — ONDANSETRON HCL 4 MG PO TABS: 4.0000 mg | ORAL_TABLET | Freq: Four times a day (QID) | ORAL | Status: DC | PRN

## 2017-11-27 MED ORDER — INFLUENZA VAC SPLIT HIGH-DOSE 0.5 ML IM SUSY
0.5000 mL | PREFILLED_SYRINGE | INTRAMUSCULAR | Status: DC
  Filled 2017-11-27: qty 0.5

## 2017-11-27 MED ORDER — FLUTICASONE PROPIONATE 50 MCG/ACT NA SUSP
2.0000 | Freq: Every day | NASAL | Status: DC
  Administered 2017-11-27 – 2017-11-28 (×2): 2 via NASAL
  Filled 2017-11-27: qty 16

## 2017-11-27 MED ORDER — ISOSORBIDE MONONITRATE ER 30 MG PO TB24
30.0000 mg | ORAL_TABLET | Freq: Every day | ORAL | Status: DC
  Administered 2017-11-27 – 2017-11-28 (×2): 30 mg via ORAL
  Filled 2017-11-27 (×2): qty 1

## 2017-11-27 MED ORDER — NITROGLYCERIN 0.4 MG SL SUBL: 0.4000 mg | SUBLINGUAL_TABLET | SUBLINGUAL | Status: DC | PRN

## 2017-11-27 MED ORDER — ONDANSETRON HCL 4 MG/2ML IJ SOLN: 4.0000 mg | Freq: Four times a day (QID) | INTRAMUSCULAR | Status: DC | PRN

## 2017-11-27 MED ORDER — AMIODARONE HCL 200 MG PO TABS
200.0000 mg | ORAL_TABLET | Freq: Every day | ORAL | Status: DC
  Administered 2017-11-27: 200 mg via ORAL
  Filled 2017-11-27: qty 1

## 2017-11-27 MED ORDER — ACETAMINOPHEN 325 MG PO TABS
650.0000 mg | ORAL_TABLET | Freq: Four times a day (QID) | ORAL | Status: DC | PRN
  Administered 2017-11-27: 650 mg via ORAL
  Filled 2017-11-27: qty 2

## 2017-11-27 MED ORDER — ADULT MULTIVITAMIN W/MINERALS CH
1.0000 | ORAL_TABLET | Freq: Every day | ORAL | Status: DC
  Administered 2017-11-27 – 2017-11-28 (×2): 1 via ORAL
  Filled 2017-11-27 (×2): qty 1

## 2017-11-27 MED ORDER — AMIODARONE HCL 200 MG PO TABS
400.0000 mg | ORAL_TABLET | Freq: Every day | ORAL | Status: DC
  Administered 2017-11-28: 400 mg via ORAL
  Filled 2017-11-27: qty 2

## 2017-11-27 MED ORDER — HYDROCHLOROTHIAZIDE 25 MG PO TABS
12.5000 mg | ORAL_TABLET | Freq: Every day | ORAL | Status: DC
  Administered 2017-11-27 – 2017-11-28 (×2): 12.5 mg via ORAL
  Filled 2017-11-27 (×2): qty 1

## 2017-11-27 MED ORDER — ACETAMINOPHEN 650 MG RE SUPP: 650.0000 mg | Freq: Four times a day (QID) | RECTAL | Status: DC | PRN

## 2017-11-27 MED ORDER — CALCIUM CITRATE-VITAMIN D 315-200 MG-UNIT PO TABS: 1.0000 | ORAL_TABLET | Freq: Every day | ORAL | Status: DC

## 2017-11-27 MED ORDER — ASPIRIN EC 81 MG PO TBEC
81.0000 mg | DELAYED_RELEASE_TABLET | Freq: Every day | ORAL | Status: DC
  Administered 2017-11-27: 81 mg via ORAL
  Filled 2017-11-27: qty 1

## 2017-11-27 MED ORDER — METOPROLOL SUCCINATE ER 25 MG PO TB24
25.0000 mg | ORAL_TABLET | Freq: Every day | ORAL | Status: DC
  Administered 2017-11-27: 25 mg via ORAL
  Filled 2017-11-27: qty 1

## 2017-11-27 MED ORDER — LORATADINE 10 MG PO TABS
10.0000 mg | ORAL_TABLET | Freq: Every day | ORAL | Status: DC
  Administered 2017-11-27: 10 mg via ORAL
  Filled 2017-11-27 (×2): qty 1

## 2017-11-27 MED ORDER — ASPIRIN 81 MG PO CHEW
324.0000 mg | CHEWABLE_TABLET | Freq: Once | ORAL | Status: AC
  Administered 2017-11-27: 324 mg via ORAL
  Filled 2017-11-27: qty 4

## 2017-11-27 MED ORDER — PRAVASTATIN SODIUM 40 MG PO TABS
80.0000 mg | ORAL_TABLET | Freq: Every day | ORAL | Status: DC
  Administered 2017-11-27: 80 mg via ORAL
  Filled 2017-11-27: qty 2

## 2017-11-27 MED ORDER — POTASSIUM CHLORIDE CRYS ER 20 MEQ PO TBCR
40.0000 meq | EXTENDED_RELEASE_TABLET | ORAL | Status: AC
  Administered 2017-11-27: 40 meq via ORAL
  Filled 2017-11-27: qty 2

## 2017-11-27 MED ORDER — LOSARTAN POTASSIUM 50 MG PO TABS
50.0000 mg | ORAL_TABLET | Freq: Every day | ORAL | Status: DC
  Administered 2017-11-27 – 2017-11-28 (×2): 50 mg via ORAL
  Filled 2017-11-27 (×2): qty 1

## 2017-11-27 MED ORDER — PANTOPRAZOLE SODIUM 40 MG PO TBEC: 40.0000 mg | DELAYED_RELEASE_TABLET | Freq: Two times a day (BID) | ORAL | Status: DC

## 2017-11-27 MED ORDER — CLOPIDOGREL BISULFATE 75 MG PO TABS
75.0000 mg | ORAL_TABLET | Freq: Every day | ORAL | Status: DC
  Administered 2017-11-27 – 2017-11-28 (×2): 75 mg via ORAL
  Filled 2017-11-27 (×2): qty 1

## 2017-11-27 MED ORDER — VITAMIN D3 25 MCG (1000 UNIT) PO TABS
1000.0000 [IU] | ORAL_TABLET | Freq: Every day | ORAL | Status: DC
  Administered 2017-11-27 – 2017-11-28 (×2): 1000 [IU] via ORAL
  Filled 2017-11-27 (×2): qty 1

## 2017-11-27 MED ORDER — PANTOPRAZOLE SODIUM 40 MG PO TBEC
40.0000 mg | DELAYED_RELEASE_TABLET | Freq: Two times a day (BID) | ORAL | Status: DC
  Administered 2017-11-27 – 2017-11-28 (×3): 40 mg via ORAL
  Filled 2017-11-27 (×3): qty 1

## 2017-11-27 MED ORDER — ABIRATERONE ACETATE 250 MG PO TABS: 250.0000 mg | ORAL_TABLET | Freq: Every day | ORAL | Status: DC

## 2017-11-27 MED ORDER — FAMOTIDINE 20 MG PO TABS
20.0000 mg | ORAL_TABLET | Freq: Every day | ORAL | Status: DC
  Administered 2017-11-27: 20 mg via ORAL
  Filled 2017-11-27: qty 1

## 2017-11-27 MED ORDER — CALCIUM CARBONATE-VITAMIN D 500-200 MG-UNIT PO TABS
1.0000 | ORAL_TABLET | Freq: Every day | ORAL | Status: DC
  Administered 2017-11-27: 1 via ORAL
  Filled 2017-11-27: qty 1

## 2017-11-27 MED ORDER — DOCUSATE SODIUM 100 MG PO CAPS
100.0000 mg | ORAL_CAPSULE | Freq: Two times a day (BID) | ORAL | Status: DC
  Administered 2017-11-27 (×2): 100 mg via ORAL
  Filled 2017-11-27 (×3): qty 1

## 2017-11-27 MED ORDER — APIXABAN 5 MG PO TABS
5.0000 mg | ORAL_TABLET | Freq: Two times a day (BID) | ORAL | Status: DC
  Administered 2017-11-27 – 2017-11-28 (×4): 5 mg via ORAL
  Filled 2017-11-27 (×4): qty 1

## 2017-11-27 NOTE — H&P (Signed)
Nathaniel Thompson is an 81 y.o. male.   Chief Complaint: Chest pain HPI: The patient with past medical history of diabetes, hypertension, GERD, COPD and coronary artery disease presents to the emergency department complaining of lightheadedness and chest pain.  The patient was found to be in atrial fibrillation with rapid ventricular rate but he spontaneously converted to normal sinus rhythm.  He continued to feel slight tightness in his chest but troponin was normal.  EKG also showed no signs of ischemia.  Further questioning revealed that the patient had an episode of syncope earlier this year following similar symptoms.  He also was found to have a stroke the following month indicating that perhaps his atrial fibrillation is paroxysmal and unnoticed.  For this reason the emergency department staff call hospitalist service for further work-up and management  Past Medical History:  Diagnosis Date  . Allergic rhinitis   . Barrett's esophagus 10/21/13  . Bone cancer (HCC)   . Chronic kidney disease    stones  . Colon polyp, hyperplastic   . COPD (chronic obstructive pulmonary disease) (HCC)    mild COPD. former smoker  . Coronary artery disease   . Diabetes mellitus without complication (HCC)   . Diverticulosis   . Fundic gland polyps of stomach, benign   . Gastritis   . GERD (gastroesophageal reflux disease)   . Hypercholesteremia   . Hypertension   . Prostate cancer (HCC)   . Prostatism   . Reflux esophagitis   . Renal stones   . Skin cancer    left cheek/lesion excised  . Skin cancer   . Sleep apnea   . TIA (transient ischemic attack)   . TIA (transient ischemic attack)     Past Surgical History:  Procedure Laterality Date  . CARDIAC CATHETERIZATION    . COLONOSCOPY WITH PROPOFOL N/A 09/13/2015   Procedure: COLONOSCOPY WITH PROPOFOL;  Surgeon: Martin U Skulskie, MD;  Location: [facility] ENDOSCOPY;  Service: Endoscopy;  Laterality: N/A;  . CORONARY ANGIOPLASTY    .  ESOPHAGOGASTRODUODENOSCOPY (EGD) WITH PROPOFOL N/A 09/13/2015   Procedure: ESOPHAGOGASTRODUODENOSCOPY (EGD) WITH PROPOFOL;  Surgeon: Martin U Skulskie, MD;  Location: [facility] ENDOSCOPY;  Service: Endoscopy;  Laterality: N/A;  . ESOPHAGOGASTRODUODENOSCOPY (EGD) WITH PROPOFOL N/A 12/28/2015   Procedure: ESOPHAGOGASTRODUODENOSCOPY (EGD) WITH PROPOFOL;  Surgeon: Martin U Skulskie, MD;  Location: [facility] ENDOSCOPY;  Service: Endoscopy;  Laterality: N/A;  . ESOPHAGOGASTRODUODENOSCOPY (EGD) WITH PROPOFOL N/A 06/16/2016   Procedure: ESOPHAGOGASTRODUODENOSCOPY (EGD) WITH PROPOFOL;  Surgeon: Martin U Skulskie, MD;  Location: [facility] ENDOSCOPY;  Service: Endoscopy;  Laterality: N/A;  . EYE SURGERY  2013   CATARACT EXTRACTION  . GANGLION CYST EXCISION Right 05/18/2017   Procedure: REMOVAL GANGLION CYST ANKLE;  Surgeon: Fowler, Justin, DPM;  Location: [facility] ORS;  Service: Podiatry;  Laterality: Right;  . heart cath stent    . PROSTATE BIOPSY    . stents     multiple    Family History  Problem Relation Age of Onset  . Cancer Mother        gastric and lung  . Cancer Father        multiple myeloma  . Stroke Father   . Cancer Sister        leukemia  . Cancer Brother        leukemia  . Cancer Brother        kidney  . Cancer Daughter        Uterine  . Cancer Other          Nephes (Sister's Son): Prostate   Social History:  reports that he quit smoking about 31 years ago. His smoking use included cigarettes. He has a 35.00 pack-year smoking history. He has quit using smokeless tobacco.  His smokeless tobacco use included chew. He reports that he does not drink alcohol or use drugs.  Allergies:  Allergies  Allergen Reactions  . Atorvastatin Other (See Comments)    Achy joints    Medications Prior to Admission  Medication Sig Dispense Refill  . abiraterone acetate (ZYTIGA) 250 MG tablet Take 250 mg by mouth daily.    [name] aspirin 81 MG tablet Take 81 mg by mouth daily.    . calcium citrate-vitamin D  (CITRACAL+D) 315-200 MG-UNIT tablet Take 1 tablet by mouth daily.    . cholecalciferol (VITAMIN D) 1000 units tablet Take 1,000 Units by mouth daily.     . clopidogrel (PLAVIX) 75 MG tablet Take 1 tablet (75 mg total) by mouth daily. 90 tablet 0  . fexofenadine (ALLEGRA) 180 MG tablet Take 180 mg by mouth daily as needed for allergies or rhinitis.    . fluticasone (FLONASE) 50 MCG/ACT nasal spray Place 2 sprays into both nostrils daily as needed for allergies.     . hydrochlorothiazide (HYDRODIURIL) 12.5 MG tablet Take 1 tablet (12.5 mg total) by mouth daily. In AM. 90 tablet 1  . Ipratropium-Albuterol (COMBIVENT) 20-100 MCG/ACT AERS respimat 2 puffs qid prn (Patient taking differently: Inhale 2 puffs into the lungs every 6 (six) hours as needed for wheezing. ) 1 Inhaler 3  . isosorbide mononitrate (IMDUR) 30 MG 24 hr tablet TAKE 1 TABLET BY MOUTH ONCE DAILY 90 tablet 2  . Leuprolide Acetate, 6 Month, (LUPRON) 45 MG injection Inject 45 mg into the muscle every 6 (six) months.    [name] losartan (COZAAR) 50 MG tablet Take 50 mg by mouth daily.    [name] lovastatin (MEVACOR) 40 MG tablet TAKE 2 TABLETS BY MOUTH AT BEDTIME 180 tablet 1  . metoprolol succinate (TOPROL-XL) 25 MG 24 hr tablet Take 25 mg by mouth daily.    . Multiple Vitamin (MULTI-VITAMINS) TABS Take 1 tablet by mouth daily.     . nitroGLYCERIN (NITROSTAT) 0.4 MG SL tablet Place 0.4 mg under the tongue every 5 (five) minutes as needed for chest pain.    . pantoprazole (PROTONIX) 40 MG tablet Take 40 mg by mouth 2 (two) times daily.     . Polyethylene Glycol 3350 (MIRALAX PO) Take 1 packet by mouth daily as needed (constipation).     . ranitidine (ZANTAC) 150 MG tablet Take 150 mg by mouth daily.       Results for orders placed or performed during the hospital encounter of 11/27/17 (from the past 48 hour(s))  TSH     Status: None   Collection Time: 11/26/17  9:35 PM  Result Value Ref Range   TSH 2.864 0.350 - 4.500 uIU/mL    Comment:  Performed by a 3rd Generation assay with a functional sensitivity of <=0.01 uIU/mL. Performed at [facility], [address] [place], [place] [address]   Basic metabolic panel     Status: Abnormal   Collection Time: 11/27/17  1:36 AM  Result Value Ref Range   Sodium 140 135 - 145 mmol/L   Potassium 3.4 (L) 3.5 - 5.1 mmol/L   Chloride 111 98 - 111 mmol/L   CO2 23 22 - 32 mmol/L   Glucose, Bld 123 (H) 70 - 99 mg/dL   BUN  19 8 - 23 mg/dL   Creatinine, Ser 1.01 0.61 - 1.24 mg/dL   Calcium 9.2 8.9 - 10.3 mg/dL   GFR calc non Af Amer >60 >60 mL/min   GFR calc Af Amer >60 >60 mL/min    Comment: (NOTE) The eGFR has been calculated using the CKD EPI equation. This calculation has not been validated in all clinical situations. eGFR's persistently <60 mL/min signify possible Chronic Kidney Disease.    Anion gap 6 5 - 15    Comment: Performed at [facility], [address] [place], [place] [address]  CBC     Status: Abnormal   Collection Time: 11/27/17  1:36 AM  Result Value Ref Range   WBC 5.4 3.8 - 10.6 K/uL   RBC 4.59 4.40 - 5.90 MIL/uL   Hemoglobin 13.7 13.0 - 18.0 g/dL   HCT 39.7 (L) 40.0 - 52.0 %   MCV 86.5 80.0 - 100.0 fL   MCH 29.9 26.0 - 34.0 pg   MCHC 34.5 32.0 - 36.0 g/dL   RDW 14.0 11.5 - 14.5 %   Platelets 182 150 - 440 K/uL    Comment: Performed at [facility], [address] [place], [place] [address]  Troponin I     Status: None   Collection Time: 11/27/17  1:36 AM  Result Value Ref Range   Troponin I <0.03 <0.03 ng/mL    Comment: Performed at [facility], [address] [place], [place] [address]  Magnesium     Status: None   Collection Time: 11/27/17  1:36 AM  Result Value Ref Range   Magnesium 1.7 1.7 - 2.4 mg/dL    Comment: Performed at [facility], [address] [place], [place] [address]   Dg Chest Port 1 View  Result Date: 11/27/2017 CLINICAL DATA:  81-year-old male with chest pain. History of  prostate cancer. EXAM: PORTABLE CHEST 1 VIEW COMPARISON:  None. FINDINGS: A 7 mm nodular density in the right upper lobe overlying the right posterior fourth rib may represent a calcified granuloma or a sclerotic bone lesion. The lungs are otherwise clear. There is no pleural effusion or pneumothorax. Mild cardiomegaly. No acute osseous pathology. IMPRESSION: No active disease. Electronically Signed   By: Arash  Radparvar M.D.   On: 11/27/2017 02:40    Review of Systems  Constitutional: Negative for chills and fever.  HENT: Negative for sore throat and tinnitus.   Eyes: Negative for blurred vision and redness.  Respiratory: Negative for cough and shortness of breath.   Cardiovascular: Positive for chest pain. Negative for palpitations, orthopnea and PND.  Gastrointestinal: Negative for abdominal pain, diarrhea, nausea and vomiting.  Genitourinary: Negative for dysuria, frequency and urgency.  Musculoskeletal: Negative for joint pain and myalgias.  Skin: Negative for rash.       No lesions  Neurological: Negative for speech change, focal weakness and weakness.  Endo/Heme/Allergies: Does not bruise/bleed easily.       No temperature intolerance  Psychiatric/Behavioral: Negative for depression and suicidal ideas.    Blood pressure (!) 175/87, pulse 61, temperature (!) 97.5 F (36.4 C), temperature source Oral, resp. rate 18, height 5' 9.5" (1.765 m), weight 90.7 kg, SpO2 99 %. Physical Exam  Vitals reviewed. Constitutional: He is oriented to person, place, and time. He appears well-developed and well-nourished. No distress.  HENT:  Head: Normocephalic and atraumatic.  Mouth/Throat: Oropharynx is clear and moist.  Eyes: Pupils are equal, round, and reactive to light. Conjunctivae and EOM are   normal. No scleral icterus.  Neck: Normal range of motion. Neck supple. No JVD present. No tracheal deviation present. No thyromegaly present.  Cardiovascular: Normal rate, regular rhythm and normal  heart sounds. Exam reveals no gallop and no friction rub.  No murmur heard. Respiratory: Effort normal and breath sounds normal. No respiratory distress.  GI: Soft. Bowel sounds are normal. He exhibits no distension. There is no tenderness.  Genitourinary:  Genitourinary Comments: Deferred  Musculoskeletal: Normal range of motion. He exhibits no edema.  Lymphadenopathy:    He has no cervical adenopathy.  Neurological: He is alert and oriented to person, place, and time. No cranial nerve deficit. Coordination normal.  Skin: Skin is warm and dry. No rash noted. No erythema.  Psychiatric: He has a normal mood and affect. His behavior is normal. Judgment and thought content normal.     Assessment/Plan This is a 82 year old male admitted for atrial fibrillation with rapid ventricular rate. 1.  A. fib: With RVR; resolved.  Obtain echocardiogram as well as cardiology consult for rhythm control and possible management of thrombus. 2.  Diabetes mellitus type 2: Hold oral hypoglycemic agents.  Sliding scale insulin while hospitalized 3.  Coronary artery disease: Negative cardiac biomarkers.  Continue to monitor telemetry.  Continue aspirin and Plavix. Imdur per home regimen.   4.  Hypertension: Uncontrolled; continue hydrochlorothiazide, metoprolol and losartan.  Labetalol as needed 5.  Hyperlipidemia: Continue statin therapy 6.  DVT prophylaxis: I started the patient on Eliquis 7.  GI prophylaxis: Pantoprazole per home regimen The patient is a full code.  Time spent on admission orders and patient care approximately 45 minutes  Harrie Foreman, MD 11/27/2017, 7:57 AM

## 2017-11-27 NOTE — Progress Notes (Signed)
[facility] NAME: Nathaniel Thompson    MR#:  [id number]  DATE OF BIRTH:  May 07, 1935  SUBJECTIVE:   Patient presented to the hospital due to chest pain/pressure and noted to be in A. fib with RVR.  Now has converted to normal sinus rhythm.  Somewhat bradycardic and asymptomatic.  Seen by cardiology and started on oral amiodarone and Eliquis.  REVIEW OF SYSTEMS:    Review of Systems  Constitutional: Negative for chills and fever.  HENT: Negative for congestion and tinnitus.   Eyes: Negative for blurred vision and double vision.  Respiratory: Negative for cough, shortness of breath and wheezing.   Cardiovascular: Negative for chest pain, orthopnea and PND.  Gastrointestinal: Negative for abdominal pain, diarrhea, nausea and vomiting.  Genitourinary: Negative for dysuria and hematuria.  Neurological: Negative for dizziness, sensory change and focal weakness.  All other systems reviewed and are negative.   Nutrition: Heart healthy Tolerating Diet: Yes Tolerating PT: Ambulatory  DRUG ALLERGIES:   Allergies  Allergen Reactions  . Atorvastatin Other (See Comments)    Achy joints    VITALS:  Blood pressure 121/77, pulse 64, temperature 99.4 F (37.4 C), temperature source Oral, resp. rate 18, height 5' 9.5" (1.765 m), weight 90.7 kg, SpO2 98 %.  PHYSICAL EXAMINATION:   Physical Exam  GENERAL:  82 y.o.-year-old patient lying in bed in no acute distress.  EYES: Pupils equal, round, reactive to light and accommodation. No scleral icterus. Extraocular muscles intact.  HEENT: Head atraumatic, normocephalic. Oropharynx and nasopharynx clear.  NECK:  Supple, no jugular venous distention. No thyroid enlargement, no tenderness.  LUNGS: Normal breath sounds bilaterally, no wheezing, rales, rhonchi. No use of accessory muscles of respiration.  CARDIOVASCULAR: S1, S2 normal. No murmurs, rubs, or gallops.  ABDOMEN: Soft, nontender,  nondistended. Bowel sounds present. No organomegaly or mass.  EXTREMITIES: No cyanosis, clubbing or edema b/l.    NEUROLOGIC: Cranial nerves II through XII are intact. No focal Motor or sensory deficits b/l.   PSYCHIATRIC: The patient is alert and oriented x 3.  SKIN: No obvious rash, lesion, or ulcer.    LABORATORY PANEL:   CBC Recent Labs  Lab 11/27/17 0136  WBC 5.4  HGB 13.7  HCT 39.7*  PLT 182   ------------------------------------------------------------------------------------------------------------------  Chemistries  Recent Labs  Lab 11/27/17 0136  NA 140  K 3.4*  CL 111  CO2 23  GLUCOSE 123*  BUN 19  CREATININE 1.01  CALCIUM 9.2  MG 1.7   ------------------------------------------------------------------------------------------------------------------  Cardiac Enzymes Recent Labs  Lab 11/27/17 0136  TROPONINI <0.03   ------------------------------------------------------------------------------------------------------------------  RADIOLOGY:  Dg Chest Port 1 View  Result Date: 11/27/2017 CLINICAL DATA:  82 year old male with chest pain. History of prostate cancer. EXAM: PORTABLE CHEST 1 VIEW COMPARISON:  None. FINDINGS: A 7 mm nodular density in the right upper lobe overlying the right posterior fourth rib may represent a calcified granuloma or a sclerotic bone lesion. The lungs are otherwise clear. There is no pleural effusion or pneumothorax. Mild cardiomegaly. No acute osseous pathology. IMPRESSION: No active disease. Electronically Signed   By: Anner Crete M.D.   On: 11/27/2017 02:40     ASSESSMENT AND PLAN:   82 year old male with past medical history of coronary artery disease with previous stent placement, previous TIA, hypertension, hyperlipidemia, history of prostate cancer, GERD, COPD who presented to the hospital due to chest pain and noted to be in atrial fibrillation with rapid ventricular response.  1.  New onset atrial fibrillation  with rapid ventricle response- this was seen in the emergency room and also by EMS but now has converted to normal sinus rhythm. -Seen by cardiology and started on oral amiodarone.  Also has been started on oral Eliquis for anticoagulation. -Await echocardiogram results and further input from cardiology.  2.  Chest pain-suspect to be secondary to the new onset A. fib.  Now resolved. - Observe on telemetry, cycle markers, await echocardiogram results. -Continue Plavix, Imdur, losartan  3.  Essential hypertension-continue hydrochlorothiazide, losartan, Imdur.  4.  Hyperlipidemia-continue Pravachol.  5.  GERD-continue Protonix.    All the records are reviewed and case discussed with Care Management/Social Worker. Management plans discussed with the patient, family and they are in agreement.  CODE STATUS: Full code  DVT Prophylaxis: Eliquis  TOTAL TIME TAKING CARE OF THIS PATIENT: 30 minutes.   POSSIBLE D/C IN 1-2 DAYS, DEPENDING ON CLINICAL CONDITION.   Henreitta Leber M.D on 11/27/2017 at 3:28 PM  Between 7am to 6pm - Pager - [phone]  After 6pm go to [email or web address] - Proofreader  [facility]  Office  [phone]  CC: Primary care physician; Einar Pheasant, MD

## 2017-11-27 NOTE — ED Notes (Signed)
Report was given to floor.  

## 2017-11-27 NOTE — Progress Notes (Signed)
*  PRELIMINARY RESULTS* Echocardiogram 2D Echocardiogram has been performed.  Nathaniel Thompson 11/27/2017, 10:15 AM

## 2017-11-27 NOTE — Consult Note (Signed)
Nathaniel Thompson is a 82 y.o. male  [id number]  Primary Cardiologist: Neoma Laming Reason for Consultation: Chest pain and palpitation and atrial fibrillation  HPI: This is a 82 year old white male with a past medical history of coronary artery disease status post PCI and stenting of the RCA and OM in 2011 presented to the hospital with chest pain palpitation and dizziness and was found to be in atrial fibrillation with rapid ventricular response rate.  Patient denies any chest pain or shortness of breath at this time.   Review of Systems: No orthopnea PND or leg swelling but did have some dizziness   Past Medical History:  Diagnosis Date  . Allergic rhinitis   . Barrett's esophagus 10/21/13  . Bone cancer ([place])   . Chronic kidney disease    stones  . Colon polyp, hyperplastic   . COPD (chronic obstructive pulmonary disease) (HCC)    mild COPD. former smoker  . Coronary artery disease   . Diabetes mellitus without complication ([place])   . Diverticulosis   . Fundic gland polyps of stomach, benign   . Gastritis   . GERD (gastroesophageal reflux disease)   . Hypercholesteremia   . Hypertension   . Prostate cancer ([place])   . Prostatism   . Reflux esophagitis   . Renal stones   . Skin cancer    left cheek/lesion excised  . Skin cancer   . Sleep apnea   . TIA (transient ischemic attack)   . TIA (transient ischemic attack)     Medications Prior to Admission  Medication Sig Dispense Refill  . abiraterone acetate (ZYTIGA) 250 MG tablet Take 250 mg by mouth daily.    [name] aspirin 81 MG tablet Take 81 mg by mouth daily.    . calcium citrate-vitamin D (CITRACAL+D) 315-200 MG-UNIT tablet Take 1 tablet by mouth daily.    . cholecalciferol (VITAMIN D) 1000 units tablet Take 1,000 Units by mouth daily.     . clopidogrel (PLAVIX) 75 MG tablet Take 1 tablet (75 mg total) by mouth daily. 90 tablet 0  . fexofenadine (ALLEGRA) 180 MG tablet Take 180 mg by mouth daily as needed for allergies  or rhinitis.    . fluticasone (FLONASE) 50 MCG/ACT nasal spray Place 2 sprays into both nostrils daily as needed for allergies.     . hydrochlorothiazide (HYDRODIURIL) 12.5 MG tablet Take 1 tablet (12.5 mg total) by mouth daily. In AM. 90 tablet 1  . Ipratropium-Albuterol (COMBIVENT) 20-100 MCG/ACT AERS respimat 2 puffs qid prn (Patient taking differently: Inhale 2 puffs into the lungs every 6 (six) hours as needed for wheezing. ) 1 Inhaler 3  . isosorbide mononitrate (IMDUR) 30 MG 24 hr tablet TAKE 1 TABLET BY MOUTH ONCE DAILY 90 tablet 2  . Leuprolide Acetate, 6 Month, (LUPRON) 45 MG injection Inject 45 mg into the muscle every 6 (six) months.    [name] losartan (COZAAR) 50 MG tablet Take 50 mg by mouth daily.    [name] lovastatin (MEVACOR) 40 MG tablet TAKE 2 TABLETS BY MOUTH AT BEDTIME 180 tablet 1  . metoprolol succinate (TOPROL-XL) 25 MG 24 hr tablet Take 25 mg by mouth daily.    . Multiple Vitamin (MULTI-VITAMINS) TABS Take 1 tablet by mouth daily.     . nitroGLYCERIN (NITROSTAT) 0.4 MG SL tablet Place 0.4 mg under the tongue every 5 (five) minutes as needed for chest pain.    . pantoprazole (PROTONIX) 40 MG tablet Take 40 mg by mouth 2 (  two) times daily.     . Polyethylene Glycol 3350 (MIRALAX PO) Take 1 packet by mouth daily as needed (constipation).     . ranitidine (ZANTAC) 150 MG tablet Take 150 mg by mouth daily.        [name] abiraterone acetate  250 mg Oral Daily  . apixaban  5 mg Oral BID  . aspirin EC  81 mg Oral Daily  . calcium-vitamin D  1 tablet Oral Q breakfast  . cholecalciferol  1,000 Units Oral Daily  . clopidogrel  75 mg Oral Daily  . docusate sodium  100 mg Oral BID  . famotidine  20 mg Oral Daily  . fluticasone  2 spray Each Nare Daily  . hydrochlorothiazide  12.5 mg Oral Daily  . [START ON 11/28/2017] Influenza vac split quadrivalent PF  0.5 mL Intramuscular Tomorrow-1000  . isosorbide mononitrate  30 mg Oral Daily  . loratadine  10 mg Oral Daily  . losartan  50 mg Oral  Daily  . metoprolol succinate  25 mg Oral Daily  . multivitamin with minerals  1 tablet Oral Daily  . pantoprazole  40 mg Oral BID  . pravastatin  80 mg Oral q1800    Infusions:   Allergies  Allergen Reactions  . Atorvastatin Other (See Comments)    Achy joints    Social History   Socioeconomic History  . Marital status: Married    Spouse name: Not on file  . Number of children: Not on file  . Years of education: Not on file  . Highest education level: Not on file  Occupational History  . Not on file  Social Needs  . Financial resource strain: Not hard at all  . Food insecurity:    Worry: Never true    Inability: Never true  . Transportation needs:    Medical: No    Non-medical: No  Tobacco Use  . Smoking status: Former Smoker    Packs/day: 1.00    Years: 35.00    Pack years: 35.00    Types: Cigarettes    Last attempt to quit: 03/06/1986    Years since quitting: 31.7  . Smokeless tobacco: Former Systems developer    Types: Chew  Substance and Sexual Activity  . Alcohol use: No  . Drug use: No  . Sexual activity: Not Currently  Lifestyle  . Physical activity:    Days per week: Not on file    Minutes per session: Not on file  . Stress: Not on file  Relationships  . Social connections:    Talks on phone: Not on file    Gets together: Not on file    Attends religious service: Not on file    Active member of club or organization: Not on file    Attends meetings of clubs or organizations: Not on file    Relationship status: Not on file  . Intimate partner violence:    Fear of current or ex partner: No    Emotionally abused: No    Physically abused: No    Forced sexual activity: No  Other Topics Concern  . Not on file  Social History Narrative  . Not on file    Family History  Problem Relation Age of Onset  . Cancer Mother        gastric and lung  . Cancer Father        multiple myeloma  . Stroke Father   . Cancer Sister  leukemia  . Cancer Brother         leukemia  . Cancer Brother        kidney  . Cancer Daughter        Uterine  . Cancer Other        [place] Son): Prostate    PHYSICAL EXAM: Vitals:   11/27/17 0357 11/27/17 0757  BP: (!) 175/87 (!) 166/83  Pulse: 61 63  Resp: 18   Temp: (!) 97.5 F (36.4 C) 98 F (36.7 C)  SpO2: 99% 100%    No intake or output data in the 24 hours ending 11/27/17 1017  General:  Well appearing. No respiratory difficulty HEENT: normal Neck: supple. no JVD. Carotids 2+ bilat; no bruits. No lymphadenopathy or thryomegaly appreciated. Cor: PMI nondisplaced. Regular rate & rhythm. No rubs, gallops or murmurs. Lungs: clear Abdomen: soft, nontender, nondistended. No hepatosplenomegaly. No bruits or masses. Good bowel sounds. Extremities: no cyanosis, clubbing, rash, edema Neuro: alert & oriented x 3, cranial nerves grossly intact. moves all 4 extremities w/o difficulty. Affect pleasant.  ECG: Normal sinus rhythm nonspecific ST-T changes  Results for orders placed or performed during the hospital encounter of 11/27/17 (from the past 24 hour(s))  TSH     Status: None   Collection Time: 11/26/17  9:35 PM  Result Value Ref Range   TSH 2.864 0.350 - 4.500 uIU/mL  Basic metabolic panel     Status: Abnormal   Collection Time: 11/27/17  1:36 AM  Result Value Ref Range   Sodium 140 135 - 145 mmol/L   Potassium 3.4 (L) 3.5 - 5.1 mmol/L   Chloride 111 98 - 111 mmol/L   CO2 23 22 - 32 mmol/L   Glucose, Bld 123 (H) 70 - 99 mg/dL   BUN 19 8 - 23 mg/dL   Creatinine, Ser 1.01 0.61 - 1.24 mg/dL   Calcium 9.2 8.9 - 10.3 mg/dL   GFR calc non Af Amer >60 >60 mL/min   GFR calc Af Amer >60 >60 mL/min   Anion gap 6 5 - 15  CBC     Status: Abnormal   Collection Time: 11/27/17  1:36 AM  Result Value Ref Range   WBC 5.4 3.8 - 10.6 K/uL   RBC 4.59 4.40 - 5.90 MIL/uL   Hemoglobin 13.7 13.0 - 18.0 g/dL   HCT 39.7 (L) 40.0 - 52.0 %   MCV 86.5 80.0 - 100.0 fL   MCH 29.9 26.0 - 34.0 pg   MCHC 34.5  32.0 - 36.0 g/dL   RDW 14.0 11.5 - 14.5 %   Platelets 182 150 - 440 K/uL  Troponin I     Status: None   Collection Time: 11/27/17  1:36 AM  Result Value Ref Range   Troponin I <0.03 <0.03 ng/mL  Magnesium     Status: None   Collection Time: 11/27/17  1:36 AM  Result Value Ref Range   Magnesium 1.7 1.7 - 2.4 mg/dL   Dg Chest Port 1 View  Result Date: 11/27/2017 CLINICAL DATA:  82 year old male with chest pain. History of prostate cancer. EXAM: PORTABLE CHEST 1 VIEW COMPARISON:  None. FINDINGS: A 7 mm nodular density in the right upper lobe overlying the right posterior fourth rib may represent a calcified granuloma or a sclerotic bone lesion. The lungs are otherwise clear. There is no pleural effusion or pneumothorax. Mild cardiomegaly. No acute osseous pathology. IMPRESSION: No active disease. Electronically Signed   By: Laren Everts.D.  On: 11/27/2017 02:40     ASSESSMENT AND PLAN: Atrial fibrillation with rapid ventricular response rate converted to sinus rhythm right now in sinus bradycardia about 59 bpm without any symptoms.  Patient had CTA coronaries done on 06/18/2017 at the office which showed calcium score 1759.8 and stents in mid RCA as well as obtuse marginal were patent without any significant restenosis and mid LAD had mild disease.  He has normally mild mitral regurgitation and trace aortic regurgitation with normal left particular systolic function.  Echocardiogram is pending.  Advise adding amiodarone to prevent atrial fibrillation and amiodarone will be given p.o. and agree with starting the patient on anticoagulants and will start the patient on Protonix 40 twice daily and DC aspirin continue Plavix along with blood thinners.  Will observe the patient overnight and will do outpatient work-up for coronary artery disease possible other etiology of atrial fibrillation.  Bennett Vanscyoc A

## 2017-11-27 NOTE — ED Notes (Signed)
Called to give report, nurse was unavailable due to another admission and stated that she would call back to ext [phone]

## 2017-11-27 NOTE — Telephone Encounter (Signed)
Copied from [place] [id number]. Topic: Appointment Scheduling - Scheduling Inquiry for Clinic >> Nov 27, 2017  4:16 PM Sheran Luz wrote: Reason for CRM: Pts wife called to reschedule her husbands missed appointment today (hospitalized) and pt would like to know if Dr. Nicki Reaper could work him in on the week of 09/30-10/4. Holley Raring is requesting a call back to schedule/discuss.   Cb# [phone]

## 2017-11-27 NOTE — Progress Notes (Signed)
ANTICOAGULATION CONSULT NOTE - Initial Consult  Pharmacy Consult for apixaban Indication: atrial fibrillation  Allergies  Allergen Reactions  . Atorvastatin Other (See Comments)    Achy joints    Patient Measurements: Height: 5' 9.5" (176.5 cm) Weight: 200 lb (90.7 kg) IBW/kg (Calculated) : 71.85  Vital Signs: Temp: 98.2 F (36.8 C) (09/24 0138) BP: 153/73 (09/24 0330) Pulse Rate: 59 (09/24 0330)  Labs: Recent Labs    11/27/17 0136  HGB 13.7  HCT 39.7*  PLT 182  CREATININE 1.01  TROPONINI <0.03    Estimated Creatinine Clearance: 66.6 mL/min (by C-G formula based on SCr of 1.01 mg/dL).   Medical History: Past Medical History:  Diagnosis Date  . Allergic rhinitis   . Barrett's esophagus 10/21/13  . Bone cancer ([place])   . Chronic kidney disease    stones  . Colon polyp, hyperplastic   . COPD (chronic obstructive pulmonary disease) (HCC)    mild COPD. former smoker  . Coronary artery disease   . Diabetes mellitus without complication ([place])   . Diverticulosis   . Fundic gland polyps of stomach, benign   . Gastritis   . GERD (gastroesophageal reflux disease)   . Hypercholesteremia   . Hypertension   . Prostate cancer ([place])   . Prostatism   . Reflux esophagitis   . Renal stones   . Skin cancer    left cheek/lesion excised  . Skin cancer   . Sleep apnea   . TIA (transient ischemic attack)   . TIA (transient ischemic attack)     Medications:  Scheduled:  . apixaban  5 mg Oral BID    Assessment: Patient admitted for CP and tightness was in AF up to 140 in EMS then converted to SR. EKG showing NSR, initial trop negative. Patient is being started on apixaban for anticoagulation of paroxysmal afib.  Goal of Therapy:  Monitor platelets by anticoagulation protocol: Yes   Plan:  Based on not meeting dose reduction criteria for non-valvular afib (Wt. < 60 kg, Scr > 1.5 mg/dL or age > 80 years) Will initiate patient on apixaban 5 mg twice daily. Baseline  CBC WNL. Will monitor for s/sx of bleeding.  Tobie Lords, PharmD, BCPS Clinical Pharmacist 11/27/2017

## 2017-11-27 NOTE — ED Provider Notes (Signed)
[facility] Emergency Department Provider Note    First MD Initiated Contact with Patient 11/27/17 [phone]     (approximate)  I have reviewed the triage vital signs and the nursing notes.   HISTORY  Chief Complaint Chest Pain    HPI Nathaniel Thompson is a 82 y.o. male with below list of chronic medical conditions presents to the emergency department via EMS with acute onset of rapid irregular heartbeat tonight on awakening after using the restroom.  Patient also admits to chest tightness".  EMS states on their arrival patient initial EKG revealed atrial fibrillation with rapid ventricular response with a heart rate of 140 and variable.  Patient states that he had a syncopal episode in April of this year with subsequent echo and stress test performed which were unremarkable.  Patient states that he was subsequently informed that he had a ischemic stroke which was discovered by Dr. Manuella Ghazi.  Of breath at this time.   Past Medical History:  Diagnosis Date  . Allergic rhinitis   . Barrett's esophagus 10/21/13  . Bone cancer ([place])   . Chronic kidney disease    stones  . Colon polyp, hyperplastic   . COPD (chronic obstructive pulmonary disease) (HCC)    mild COPD. former smoker  . Coronary artery disease   . Diabetes mellitus without complication ([place])   . Diverticulosis   . Fundic gland polyps of stomach, benign   . Gastritis   . GERD (gastroesophageal reflux disease)   . Hypercholesteremia   . Hypertension   . Prostate cancer ([place])   . Prostatism   . Reflux esophagitis   . Renal stones   . Skin cancer    left cheek/lesion excised  . Skin cancer   . Sleep apnea   . TIA (transient ischemic attack)   . TIA (transient ischemic attack)     Patient Active Problem List   Diagnosis Date Noted  . Atrial fibrillation, new onset ([place]) 11/27/2017  . Dizziness 08/12/2017  . Nasal erythema 04/10/2017  . Carotid artery disease ([place]) 08/06/2016  . History of TIA  (transient ischemic attack) 07/24/2016  . Sleep apnea 07/24/2016  . COPD (chronic obstructive pulmonary disease) ([place]) 07/24/2016  . CKD (chronic kidney disease) 07/24/2016  . Barrett's esophagus 07/24/2016  . Hyperglycemia 07/24/2016  . Hypercholesterolemia 07/12/2016  . Hypertension, essential 07/12/2016  . CAD (coronary artery disease) 07/12/2016  . Malignant neoplasm of prostate ([place]) 11/15/2015  . Family history of cancer 11/15/2015    Past Surgical History:  Procedure Laterality Date  . CARDIAC CATHETERIZATION    . COLONOSCOPY WITH PROPOFOL N/A 09/13/2015   Procedure: COLONOSCOPY WITH PROPOFOL;  Surgeon: Lollie Sails, MD;  Location: [facility] ENDOSCOPY;  Service: Endoscopy;  Laterality: N/A;  . CORONARY ANGIOPLASTY    . ESOPHAGOGASTRODUODENOSCOPY (EGD) WITH PROPOFOL N/A 09/13/2015   Procedure: ESOPHAGOGASTRODUODENOSCOPY (EGD) WITH PROPOFOL;  Surgeon: Lollie Sails, MD;  Location[facility] ENDOSCOPY;  Service: Endoscopy;  Laterality: N/A;  . ESOPHAGOGASTRODUODENOSCOPY (EGD) WITH PROPOFOL N/A 12/28/2015   Procedure: ESOPHAGOGASTRODUODENOSCOPY (EGD) WITH PROPOFOL;  Surgeon: Lollie Sails, MD;  Location: [facility] ENDOSCOPY;  Service: Endoscopy;  Laterality: N/A;  . ESOPHAGOGASTRODUODENOSCOPY (EGD) WITH PROPOFOL N/A 06/16/2016   Procedure: ESOPHAGOGASTRODUODENOSCOPY (EGD) WITH PROPOFOL;  Surgeon: Lollie Sails, MD;  Location: [facility] ENDOSCOPY;  Service: Endoscopy;  Laterality: N/A;  . EYE SURGERY  2013   CATARACT EXTRACTION  . GANGLION CYST EXCISION Right 05/18/2017   Procedure: REMOVAL GANGLION CYST ANKLE;  Surgeon: Samara Deist, DPM;  Location: [facility] ORS;  Service: Podiatry;  Laterality: Right;  . heart cath stent    . PROSTATE BIOPSY    . stents     multiple    Prior to Admission medications   Medication Sig Start Date End Date Taking? Authorizing Provider  abiraterone acetate (ZYTIGA) 250 MG tablet Take 250 mg by mouth daily.   Yes [provider]  aspirin 81 MG  tablet Take 81 mg by mouth daily.   Yes [provider]  calcium citrate-vitamin D (CITRACAL+D) 315-200 MG-UNIT tablet Take 1 tablet by mouth daily.   Yes [provider]  cholecalciferol (VITAMIN D) 1000 units tablet Take 1,000 Units by mouth daily.    Yes [provider]  clopidogrel (PLAVIX) 75 MG tablet Take 1 tablet (75 mg total) by mouth daily. 08/04/16  Yes Einar Pheasant, MD  fexofenadine (ALLEGRA) 180 MG tablet Take 180 mg by mouth daily as needed for allergies or rhinitis.   Yes [provider]  fluticasone (FLONASE) 50 MCG/ACT nasal spray Place 2 sprays into both nostrils daily as needed for allergies.    Yes [provider]  hydrochlorothiazide (HYDRODIURIL) 12.5 MG tablet Take 1 tablet (12.5 mg total) by mouth daily. In AM. 08/30/17  Yes Cammie Sickle, MD  Ipratropium-Albuterol (COMBIVENT) 20-100 MCG/ACT AERS respimat 2 puffs qid prn Patient taking differently: Inhale 2 puffs into the lungs every 6 (six) hours as needed for wheezing.  01/10/17  Yes Einar Pheasant, MD  isosorbide mononitrate (IMDUR) 30 MG 24 hr tablet TAKE 1 TABLET BY MOUTH ONCE DAILY 10/15/17  Yes Einar Pheasant, MD  Leuprolide Acetate, 6 Month, (LUPRON) 45 MG injection Inject 45 mg into the muscle every 6 (six) months.   Yes [provider]  losartan (COZAAR) 50 MG tablet Take 50 mg by mouth daily.   Yes [provider]  lovastatin (MEVACOR) 40 MG tablet TAKE 2 TABLETS BY MOUTH AT BEDTIME 09/27/17  Yes Einar Pheasant, MD  metoprolol succinate (TOPROL-XL) 25 MG 24 hr tablet Take 25 mg by mouth daily.   Yes [provider]  Multiple Vitamin (MULTI-VITAMINS) TABS Take 1 tablet by mouth daily.    Yes [provider]  nitroGLYCERIN (NITROSTAT) 0.4 MG SL tablet Place 0.4 mg under the tongue every 5 (five) minutes as needed for chest pain.   Yes [provider]  pantoprazole (PROTONIX) 40 MG tablet Take 40 mg by mouth 2 (two) times  daily.    Yes [provider]  Polyethylene Glycol 3350 (MIRALAX PO) Take 1 packet by mouth daily as needed (constipation).    Yes [provider]  ranitidine (ZANTAC) 150 MG tablet Take 150 mg by mouth daily.  04/05/16  Yes [provider]    Allergies Atorvastatin  Family History  Problem Relation Age of Onset  . Cancer Mother        gastric and lung  . Cancer Father        multiple myeloma  . Stroke Father   . Cancer Sister        leukemia  . Cancer Brother        leukemia  . Cancer Brother        kidney  . Cancer Daughter        Uterine  . Cancer Other        [place] (Sister's Son): Prostate    Social History Social History   Tobacco Use  . Smoking status: Former Smoker    Packs/day: 1.00  Years: 35.00    Pack years: 35.00    Types: Cigarettes    Last attempt to quit: 03/06/1986    Years since quitting: 31.7  . Smokeless tobacco: Former Systems developer    Types: Chew  Substance Use Topics  . Alcohol use: No  . Drug use: No    Review of Systems Constitutional: No fever/chills Eyes: No visual changes. ENT: No sore throat. Cardiovascular: Positive for chest tightness and fast irregular heartbeat. Respiratory: Denies shortness of breath. Gastrointestinal: No abdominal pain.  No nausea, no vomiting.  No diarrhea.  No constipation. Genitourinary: Negative for dysuria. Musculoskeletal: Negative for neck pain.  Negative for back pain. Integumentary: Negative for rash. Neurological: Negative for headaches, focal weakness or numbness.   ____________________________________________   PHYSICAL EXAM:  VITAL SIGNS: ED Triage Vitals  Enc Vitals Group     BP 11/27/17 0138 (!) 150/95     Pulse Rate 11/27/17 0138 62     Resp 11/27/17 0138 18     Temp 11/27/17 0138 98.2 F (36.8 C)     Temp src --      SpO2 11/27/17 0138 99 %     Weight 11/27/17 0139 90.7 kg (200 lb)     Height 11/27/17 0139 1.765 m (5' 9.5")     Head Circumference --       Peak Flow --      Pain Score 11/27/17 0138 0     Pain Loc --      Pain Edu? --      Excl. in [place]? --     Constitutional: Alert and oriented. Well appearing and in no acute distress. Eyes: Conjunctivae are normal.  PERRL. EOMI. Head: Atraumatic. Mouth/Throat: Mucous membranes are moist. Oropharynx non-erythematous. Neck: No stridor.  Cardiovascular: Normal rate, regular rhythm. Good peripheral circulation. Grossly normal heart sounds. Respiratory: Normal respiratory effort.  No retractions. Lungs CTAB. Gastrointestinal: Soft and nontender. No distention.  Musculoskeletal: No lower extremity tenderness nor edema. No gross deformities of extremities. Neurologic:  Normal speech and language. No gross focal neurologic deficits are appreciated.  Skin:  Skin is warm, dry and intact. No rash noted. Psychiatric: Mood and affect are normal. Speech and behavior are normal.  ____________________________________________   LABS (all labs ordered are listed, but only abnormal results are displayed)  Labs Reviewed  BASIC METABOLIC PANEL - Abnormal; Notable for the following components:      Result Value   Potassium 3.4 (*)    Glucose, Bld 123 (*)    All other components within normal limits  CBC - Abnormal; Notable for the following components:   HCT 39.7 (*)    All other components within normal limits  TROPONIN I  MAGNESIUM   ____________________________________________  EKG  ED ECG REPORT I, [place] N Darnice Comrie, the attending physician, personally viewed and interpreted this ECG.   Date: 11/27/2017  EKG Time: 1:34 AM  Rate: 71  Rhythm: Normal sinus rhythm  Axis: Normal  Intervals: Normal  ST&T Change: None  ____________________________________________  RADIOLOGY I, [place] N Glady Ouderkirk, personally viewed and evaluated these images (plain radiographs) as part of my medical decision making, as well as reviewing the written report by the radiologist.  ED MD interpretation: No acute  findings on chest x-ray.  Official radiology report(s): Dg Chest Port 1 View  Result Date: 11/27/2017 CLINICAL DATA:  82 year old male with chest pain. History of prostate cancer. EXAM: PORTABLE CHEST 1 VIEW COMPARISON:  None. FINDINGS: A 7 mm nodular density in the right  upper lobe overlying the right posterior fourth rib may represent a calcified granuloma or a sclerotic bone lesion. The lungs are otherwise clear. There is no pleural effusion or pneumothorax. Mild cardiomegaly. No acute osseous pathology. IMPRESSION: No active disease. Electronically Signed   By: Anner Crete M.D.   On: 11/27/2017 02:40    ______________________  Procedures   ____________________________________________   INITIAL IMPRESSION / ASSESSMENT AND PLAN / ED COURSE  As part of my medical decision making, I reviewed the following data within the electronic MEDICAL RECORD NUMBER   82 year old male presenting with above-stated history and physical exam of chest tightness with irregular heartbeat.  Review of the EMS EKG revealed atrial fibrillation with rapid ventricular response.  Patient spontaneously converted to a normal sinus rhythm and is main and department.  Laboratory data unremarkable.  Patient discussed with Dr. Jannifer Franklin for hospital admission for new onset atrial fibrillation. ____________________________________________  FINAL CLINICAL IMPRESSION(S) / ED DIAGNOSES  Final diagnoses:  New onset atrial fibrillation ([place])     MEDICATIONS GIVEN DURING THIS VISIT:  Medications  potassium chloride SA (K-DUR,KLOR-CON) CR tablet 40 mEq (has no administration in time range)  aspirin chewable tablet 324 mg (324 mg Oral Given 11/27/17 0226)     ED Discharge Orders    None       Note:  This document was prepared using Dragon voice recognition software and may include unintentional dictation errors.    Gregor Hams, MD 11/27/17 [phone]

## 2017-11-27 NOTE — Care Management Note (Signed)
Case Management Note  Patient Details  Name: Nathaniel Thompson MRN: [id number] Date of Birth: 13-Dec-1935  Subjective/Objective:       Patient from home with new onset of A-Fib.  Lives with wife and is independent in the home.  Has a cane as needed by denies using it lately.  Drives to appointments.  Denies difficulty affording medications.  No housing or transportation issues. Current with PCP.  No further needs at this time.                Action/Plan: He has Medicare part D.  Eliquis coupon given to patient.   Expected Discharge Date:                  Expected Discharge Plan:  [place]  In-House Referral:     Discharge planning Services  CM Consult  Post Acute Care Choice:    Choice offered to:     DME Arranged:    DME Agency:     HH Arranged:    [place] Agency:     Status of Service:  Completed, signed off  If discussed at [employer] of Stay Meetings, dates discussed:    Additional Comments:  Elza Rafter, RN 11/27/2017, 2:06 PM

## 2017-11-27 NOTE — Care Management Obs Status (Signed)
[place] NOTIFICATION   Patient Details  Name: Nathaniel Thompson MRN: [id number] Date of Birth: 1936-02-25   Medicare Observation Status Notification Given:  Yes    Elza Rafter, RN 11/27/2017, 3:16 PM

## 2017-11-27 NOTE — ED Triage Notes (Signed)
Chest tightness and pressire an not feeling well, pt stated that it stated while he was urinating. EMS stated that on arrival  AF 140 and then when he was in the ambulance it had SR in the 70's. Pt stated that now he does not have any chest discomfort. Pt for the past few days his BP has been elevated.

## 2017-11-28 ENCOUNTER — Ambulatory Visit: Payer: Medicare HMO

## 2017-11-28 DIAGNOSIS — I10 Essential (primary) hypertension: Secondary | ICD-10-CM | POA: Diagnosis not present

## 2017-11-28 DIAGNOSIS — I4891 Unspecified atrial fibrillation: Secondary | ICD-10-CM | POA: Diagnosis not present

## 2017-11-28 DIAGNOSIS — I2510 Atherosclerotic heart disease of native coronary artery without angina pectoris: Secondary | ICD-10-CM | POA: Diagnosis not present

## 2017-11-28 DIAGNOSIS — E119 Type 2 diabetes mellitus without complications: Secondary | ICD-10-CM | POA: Diagnosis not present

## 2017-11-28 LAB — MAGNESIUM: MAGNESIUM: 1.9 mg/dL (ref 1.7–2.4)

## 2017-11-28 LAB — POTASSIUM: Potassium: 3.5 mmol/L (ref 3.5–5.1)

## 2017-11-28 MED ORDER — APIXABAN 5 MG PO TABS: 5.0000 mg | ORAL_TABLET | Freq: Two times a day (BID) | ORAL | 0 refills | Status: DC

## 2017-11-28 MED ORDER — AMIODARONE HCL 400 MG PO TABS: 400.0000 mg | ORAL_TABLET | Freq: Every day | ORAL | 0 refills | Status: DC

## 2017-11-28 NOTE — Telephone Encounter (Signed)
This patient will need a TCM

## 2017-11-28 NOTE — Progress Notes (Signed)
SUBJECTIVE: Pt is feeling well. No chest pain, palpitations, or shortness of breath.    Vitals:   11/27/17 1524 11/27/17 2128 11/28/17 0330 11/28/17 0721  BP: 121/77 (!) 123/59 (!) 149/79 (!) 163/76  Pulse: 64 (!) 52 (!) 56 (!) 54  Resp:  18 18   Temp: 99.4 F (37.4 C) 98 F (36.7 C) 98 F (36.7 C) 97.8 F (36.6 C)  TempSrc: Oral Oral Oral Oral  SpO2: 98% 96% 95% 97%  Weight:   89.7 kg   Height:        Intake/Output Summary (Last 24 hours) at 11/28/2017 0821 Last data filed at 11/28/2017 2707 Gross per 24 hour  Intake -  Output 3125 ml  Net -3125 ml    LABS: Basic Metabolic Panel: Recent Labs    11/27/17 0136 11/28/17 0621  NA 140  --   K 3.4* 3.5  CL 111  --   CO2 23  --   GLUCOSE 123*  --   BUN 19  --   CREATININE 1.01  --   CALCIUM 9.2  --   MG 1.7 1.9   Liver Function Tests: No results for input(s): AST, ALT, ALKPHOS, BILITOT, PROT, ALBUMIN in the last 72 hours. No results for input(s): LIPASE, AMYLASE in the last 72 hours. CBC: Recent Labs    11/27/17 0136  WBC 5.4  HGB 13.7  HCT 39.7*  MCV 86.5  PLT 182   Cardiac Enzymes: Recent Labs    11/27/17 0136  TROPONINI <0.03   BNP: Invalid input(s): POCBNP D-Dimer: No results for input(s): DDIMER in the last 72 hours. Hemoglobin A1C: No results for input(s): HGBA1C in the last 72 hours. Fasting Lipid Panel: No results for input(s): CHOL, HDL, LDLCALC, TRIG, CHOLHDL, LDLDIRECT in the last 72 hours. Thyroid Function Tests: Recent Labs    11/26/17 2135  TSH 2.864   Anemia Panel: No results for input(s): VITAMINB12, FOLATE, FERRITIN, TIBC, IRON, RETICCTPCT in the last 72 hours.   PHYSICAL EXAM General: Well developed, well nourished, in no acute distress HEENT:  Normocephalic and atramatic Neck:  No JVD.  Lungs: Clear bilaterally to auscultation and percussion. Heart: HRRR . Normal S1 and S2 without gallops or murmurs.  Abdomen: Bowel sounds are positive, abdomen soft and non-tender  Msk:   Back normal, normal gait. Normal strength and tone for age. Extremities: No clubbing, cyanosis or edema.   Neuro: Alert and oriented X 3. Psych:  Good affect, responds appropriately  TELEMETRY: Sinus bradycardia 58bpm  ASSESSMENT AND PLAN: Atrial fibrillation with RVR now convereted to NSR and maintained on oral amiodarone and on Eliquis.  Echo: Normal LVEF, mild mR/TR and mild diastolic dysfunction, no thrombii seen in cardiac chambers  Pt is stable and advise discharge today. Continue amiodarone 400mg /day and Eliquis, with follow up with Dr. Humphrey Rolls Friday at 10:30am.   Active Problems:   Atrial fibrillation, new onset ([place])    [name], NP-C 11/28/2017 8:21 AM Cell: [phone]

## 2017-11-28 NOTE — Progress Notes (Signed)
Patient given discharge instructions with wife at bedside. IV taken out and tele monitor off. Prescriptions given and put in packet. Patient verbalized understanding with no further questions. Patient going home via family vehicle.

## 2017-11-28 NOTE — Plan of Care (Signed)

## 2017-11-29 NOTE — Discharge Summary (Signed)
[facility] [place] NAME: Nathaniel Thompson    MR#:  [id number]  DATE OF BIRTH:  [birth year]  DATE OF ADMISSION:  11/27/2017 ADMITTING PHYSICIAN: Harrie Foreman, MD  DATE OF DISCHARGE: 11/28/2017 11:23 AM  PRIMARY CARE PHYSICIAN: Einar Pheasant, MD    ADMISSION DIAGNOSIS:  New onset atrial fibrillation ([place]) [I48.91]  DISCHARGE DIAGNOSIS:  Active Problems:   Atrial fibrillation, new onset ([place])   SECONDARY DIAGNOSIS:   Past Medical History:  Diagnosis Date  . Allergic rhinitis   . Barrett's esophagus 10/21/13  . Bone cancer ([place])   . Chronic kidney disease    stones  . Colon polyp, hyperplastic   . COPD (chronic obstructive pulmonary disease) ([facility])    mild COPD. former smoker  . Coronary artery disease   . Diabetes mellitus without complication ([place])   . Diverticulosis   . Fundic gland polyps of stomach, benign   . Gastritis   . GERD (gastroesophageal reflux disease)   . Hypercholesteremia   . Hypertension   . Prostate cancer ([place])   . Prostatism   . Reflux esophagitis   . Renal stones   . Skin cancer    left cheek/lesion excised  . Skin cancer   . Sleep apnea   . TIA (transient ischemic attack)   . TIA (transient ischemic attack)     HOSPITAL COURSE:   82 year old male with past medical history of coronary artery disease with previous stent placement, previous TIA, hypertension, hyperlipidemia, history of prostate cancer, GERD, COPD who presented to the hospital due to chest pain and noted to be in atrial fibrillation with rapid ventricular response.  1.  New onset atrial fibrillation with rapid ventricle response- patient presented to the hospital with chest pain and dizziness and was noted to be in atrial fibrillation with rapid ventricular response.  This had resolved by the time EMS brought him to the hospital. - Patient remained in a normal sinus rhythm while in the hospital.  He was started on oral amiodarone.   He is now being discharged on oral amiodarone and Eliquis for anticoagulation.  Patient's echocardiogram showed normal ejection fraction with no acute abnormalities or any thrombus.  He is being discharged home with follow-up [facility] as an [facility].   2.  Chest pain-suspect to be secondary to the new onset A. fib.   -Patient was observed on telemetry, had cardiac markers cycled which remain negative.  His echocardiogram showed normal ejection fraction with no wall motion abnormalities. -Patient will continue his Plavix, Imdur, losartan.  3.  Essential hypertension- pt. Will continue hydrochlorothiazide, losartan, Imdur. - he was taken off the Metoprolol due to Bradycardia.  4.  Hyperlipidemia- pt. Will continue his lovastatin  5.  GERD- pt. Will continue Protonix.  DISCHARGE CONDITIONS:   Stable.   CONSULTS OBTAINED:  Treatment Team:  Dionisio David, MD  DRUG ALLERGIES:   Allergies  Allergen Reactions  . Atorvastatin Other (See Comments)    Achy joints    DISCHARGE MEDICATIONS:   Allergies as of 11/28/2017      Reactions   Atorvastatin Other (See Comments)   Achy joints      Medication List    STOP taking these medications   aspirin 81 MG tablet   metoprolol succinate 25 MG 24 hr tablet Commonly known as:  TOPROL-XL     TAKE these medications   amiodarone 400 MG tablet Commonly known as:  PACERONE Take 1 tablet (400  mg total) by mouth daily.   apixaban 5 MG Tabs tablet Commonly known as:  ELIQUIS Take 1 tablet (5 mg total) by mouth 2 (two) times daily.   calcium citrate-vitamin D 315-200 MG-UNIT tablet Commonly known as:  CITRACAL+D Take 1 tablet by mouth daily.   cholecalciferol 1000 units tablet Commonly known as:  VITAMIN D Take 1,000 Units by mouth daily.   clopidogrel 75 MG tablet Commonly known as:  PLAVIX Take 1 tablet (75 mg total) by mouth daily.   fexofenadine 180 MG tablet Commonly known as:  ALLEGRA Take 180 mg by  mouth daily as needed for allergies or rhinitis.   fluticasone 50 MCG/ACT nasal spray Commonly known as:  FLONASE Place 2 sprays into both nostrils daily as needed for allergies.   hydrochlorothiazide 12.5 MG tablet Commonly known as:  HYDRODIURIL Take 1 tablet (12.5 mg total) by mouth daily. In AM.   Ipratropium-Albuterol 20-100 MCG/ACT Aers respimat Commonly known as:  COMBIVENT 2 puffs qid prn What changed:    how much to take  how to take this  when to take this  reasons to take this  additional instructions   isosorbide mononitrate 30 MG 24 hr tablet Commonly known as:  IMDUR TAKE 1 TABLET BY MOUTH ONCE DAILY   Leuprolide Acetate (6 Month) 45 MG injection Commonly known as:  LUPRON Inject 45 mg into the muscle every 6 (six) months.   losartan 50 MG tablet Commonly known as:  COZAAR Take 50 mg by mouth daily.   lovastatin 40 MG tablet Commonly known as:  MEVACOR TAKE 2 TABLETS BY MOUTH AT BEDTIME   MIRALAX PO Take 1 packet by mouth daily as needed (constipation).   MULTI-VITAMINS Tabs Take 1 tablet by mouth daily.   nitroGLYCERIN 0.4 MG SL tablet Commonly known as:  NITROSTAT Place 0.4 mg under the tongue every 5 (five) minutes as needed for chest pain.   pantoprazole 40 MG tablet Commonly known as:  PROTONIX Take 40 mg by mouth 2 (two) times daily.   ranitidine 150 MG tablet Commonly known as:  ZANTAC Take 150 mg by mouth daily.   ZYTIGA 250 MG tablet Generic drug:  abiraterone acetate Take 250 mg by mouth daily.         DISCHARGE INSTRUCTIONS:   DIET:  Cardiac diet  DISCHARGE CONDITION:  Stable  ACTIVITY:  Activity as tolerated  OXYGEN:  Home Oxygen: No.   Oxygen Delivery: room air  DISCHARGE LOCATION:  home   If you experience worsening of your admission symptoms, develop shortness of breath, life threatening emergency, suicidal or homicidal thoughts you must seek medical attention immediately by calling 911 or calling your  MD immediately  if symptoms less severe.  You Must read complete instructions/literature along with all the possible adverse reactions/side effects for all the Medicines you take and that have been prescribed to you. Take any new Medicines after you have completely understood and accpet all the possible adverse reactions/side effects.   Please note  You were cared for by a hospitalist during your hospital stay. If you have any questions about your discharge medications or the care you received while you were in the hospital after you are discharged, you can call the unit and asked to speak with the hospitalist on call if the hospitalist that took care of you is not available. Once you are discharged, your primary care physician will handle any further medical issues. Please note that NO REFILLS for any discharge medications will  be authorized once you are discharged, as it is imperative that you return to your primary care physician (or establish a relationship with a primary care physician if you do not have one) for your aftercare needs so that they can reassess your need for medications and monitor your lab values.     Today   Remains in a normal sinus rhythm, no chest pain, no other acute events overnight.  Seen by cardiology and stable to be discharged from their standpoint.  VITAL SIGNS:  Blood pressure (!) 163/76, pulse (!) 54, temperature 97.8 F (36.6 C), temperature source Oral, resp. rate 18, height 5' 9.5" (1.765 m), weight 89.7 kg, SpO2 97 %.  I/O:  No intake or output data in the 24 hours ending 11/29/17 1643  PHYSICAL EXAMINATION:  GENERAL:  82 y.o.-year-old patient lying in the bed with no acute distress.  EYES: Pupils equal, round, reactive to light and accommodation. No scleral icterus. Extraocular muscles intact.  HEENT: Head atraumatic, normocephalic. Oropharynx and nasopharynx clear.  NECK:  Supple, no jugular venous distention. No thyroid enlargement, no tenderness.   LUNGS: Normal breath sounds bilaterally, no wheezing, rales,rhonchi. No use of accessory muscles of respiration.  CARDIOVASCULAR: S1, S2 normal. No murmurs, rubs, or gallops.  ABDOMEN: Soft, non-tender, non-distended. Bowel sounds present. No organomegaly or mass.  EXTREMITIES: No pedal edema, cyanosis, or clubbing.  NEUROLOGIC: Cranial nerves II through XII are intact. No focal motor or sensory defecits b/l.  PSYCHIATRIC: The patient is alert and oriented x 3.  SKIN: No obvious rash, lesion, or ulcer.   DATA REVIEW:   CBC Recent Labs  Lab 11/27/17 0136  WBC 5.4  HGB 13.7  HCT 39.7*  PLT 182    Chemistries  Recent Labs  Lab 11/27/17 0136 11/28/17 0621  NA 140  --   K 3.4* 3.5  CL 111  --   CO2 23  --   GLUCOSE 123*  --   BUN 19  --   CREATININE 1.01  --   CALCIUM 9.2  --   MG 1.7 1.9    Cardiac Enzymes Recent Labs  Lab 11/27/17 0136  TROPONINI <0.03    Microbiology Results  Results for orders placed or performed during the hospital encounter of 11/21/16  Body fluid culture     Status: None   Collection Time: 11/21/16  1:31 PM  Result Value Ref Range Status   Specimen Description ANKLE  Final   Special Requests NONE  Final   Gram Stain   Final    ABUNDANT WBC PRESENT, PREDOMINANTLY MONONUCLEAR NO ORGANISMS SEEN    Culture   Final    NO GROWTH 3 DAYS Performed at [facility], [place] [address] [place], [place] [address]    Report Status 11/26/2016 FINAL  Final    RADIOLOGY:  No results found.    Management plans discussed with the patient, family and they are in agreement.  CODE STATUS:  Code Status History    Date Active Date Inactive Code Status Order ID Comments User Context   11/27/2017 0346 11/28/2017 1437 Full Code [id number]  Harrie Foreman, MD Inpatient    Advance Directive Documentation     Most Recent Value  Type of Advance Directive  Living will  Pre-existing out of facility DNR order (yellow form or pink MOST form)  -   "MOST" Form in Place?  -      TOTAL TIME TAKING CARE OF THIS PATIENT: 40 minutes.  Henreitta Leber M.D on 11/29/2017 at 4:43 PM  Between 7am to 6pm - Pager - [phone]  After 6pm go to [email or web address] - Proofreader  [facility]  Office  [phone]  CC: Primary care physician; Einar Pheasant, MD

## 2017-11-29 NOTE — Telephone Encounter (Signed)
Pt has been scheduled with Dr. Nicki Reaper tomorrow and advised to return to the ER if sx worsen

## 2017-11-29 NOTE — Telephone Encounter (Signed)
Transition Care Management Follow-up Telephone Call  How have you been since you were released from the hospital? Per daughter patient has had several near syncope episodes since being home, patient daughter states this is simular to what she has observer to someone with low blood sugar . Says patient feels hot the sweatie and then says he feels he is going to faint. Advised he may need to return to ED patient does not want to go back to hospital.   Do you understand why you were in the hospital? yes   Do you understand the discharge instrcutions? yes  Items Reviewed:  Medications reviewed: yes  Allergies reviewed: yes  Dietary changes reviewed: yes  Referrals reviewed: yes   Functional Questionnaire:   Activities of Daily Living (ADLs):   He states they are independent in the following: ambulation, bathing and hygiene, feeding, continence, grooming, toileting and dressing States they require assistance with the following: Need s assistance when feeeling weak and dizzy.   Any transportation issues/concerns?: no   Any patient concerns? Yes, wanting to sooner appointment.   Confirmed importance and date/time of follow-up visits scheduled: no   Confirmed with patient if condition begins to worsen call PCP or go to the ER.  Patient was given the Call-a-Nurse line [phone]: no

## 2017-11-29 NOTE — Telephone Encounter (Signed)
Agree with need for evaluation given syncope, near syncope and dizziness.  Agree with ER evaluation today.  He is followed by Dr Chancy Milroy.  Was just admitted for new onset afib.  hospitalist fel this was contributing to his symptoms.  He sees Dr Chancy Milroy - his cardiologist.  I would recommend an appt with him - to follow up given persistent symptoms.  If agreeable, your or I can let Melissa know and see if they can schedule an appt with him.  This may save two trips.  If declines, I can see him tomorrow pm.  (3:30).

## 2017-11-30 ENCOUNTER — Ambulatory Visit: Payer: Medicare HMO | Admitting: Internal Medicine

## 2017-11-30 ENCOUNTER — Encounter: Payer: Self-pay | Admitting: Internal Medicine

## 2017-11-30 ENCOUNTER — Telehealth: Payer: Self-pay

## 2017-11-30 VITALS — BP 136/72 | HR 72 | Temp 97.4°F | Resp 18 | Wt 199.6 lb

## 2017-11-30 DIAGNOSIS — I739 Peripheral vascular disease, unspecified: Secondary | ICD-10-CM

## 2017-11-30 DIAGNOSIS — N189 Chronic kidney disease, unspecified: Secondary | ICD-10-CM | POA: Diagnosis not present

## 2017-11-30 DIAGNOSIS — K2270 Barrett's esophagus without dysplasia: Secondary | ICD-10-CM | POA: Diagnosis not present

## 2017-11-30 DIAGNOSIS — R079 Chest pain, unspecified: Secondary | ICD-10-CM | POA: Diagnosis not present

## 2017-11-30 DIAGNOSIS — R42 Dizziness and giddiness: Secondary | ICD-10-CM

## 2017-11-30 DIAGNOSIS — I10 Essential (primary) hypertension: Secondary | ICD-10-CM | POA: Diagnosis not present

## 2017-11-30 DIAGNOSIS — I4891 Unspecified atrial fibrillation: Secondary | ICD-10-CM

## 2017-11-30 DIAGNOSIS — I2510 Atherosclerotic heart disease of native coronary artery without angina pectoris: Secondary | ICD-10-CM

## 2017-11-30 DIAGNOSIS — I779 Disorder of arteries and arterioles, unspecified: Secondary | ICD-10-CM

## 2017-11-30 NOTE — Telephone Encounter (Signed)
Called patient and talked to him and his wife. Nathaniel Thompson is feeling better than Nathaniel Thompson has for a week. Nathaniel Thompson has an appt with Dr Chancy Milroy on Monday at 10:30 so Nathaniel Thompson cancelled his appt for today. Nathaniel Thompson did not think Nathaniel Thompson needed to come in today for OV. Advised that if sx return Nathaniel Thompson should be evaluated sooner.

## 2017-11-30 NOTE — Patient Instructions (Signed)
Stop eliquis  Restart aspriin  Continue isosorbide

## 2017-11-30 NOTE — Telephone Encounter (Signed)
Per Juliann Pulse, pt is coming in today for hospital follow up.  Was feeling better this am, but she talked with him this pm and he does prefer to keep appt.

## 2017-11-30 NOTE — Progress Notes (Signed)
Patient ID: Nathaniel Thompson, male   DOB: 29-Nov-1935, 82 y.o.   MRN: [id number]   Subjective:    Patient ID: Nathaniel Thompson, male    DOB: [birth year], 82 y.o.   MRN: [id number]  HPI  Patient here for hospital follow up.  He is accompanied by his wife and daughter.  History obtained from all three of them.  He was recently admitted with chest pain and found to have new onset of afib with RVR.  Was placed on amiodarone.  Remained in SR while in the hospital.  Metoprolol was stopped secondary to bradycardia.  Was discharged 11/28/17.  Pts wife and daughter report that after discharge, he was noted to have dizziness and felt like he was going to pass out.  The following day, he also had episode of dizziness, light headedness.  States he feels his insides are shaking at times.  Head does not feel right and reports some intermittent chest discomfort.  Came to the office today for hospital follow up.  While sitting on the exam table, he started having chest discomfort.  Felt funny.  Started feeling a little light headed.  After lying back, symptoms resolved quickly.   EKG - appeared to be regular rhythm, ventricular rate 68 with TWI in v4-v6.  Breathing stable.  No sob.  No reported acid reflux.  No abdominal pain.  Bowels moving.  No urine change.     Past Medical History:  Diagnosis Date  . Allergic rhinitis   . Barrett's esophagus 10/21/13  . Bone cancer ([place])   . Chronic kidney disease    stones  . Colon polyp, hyperplastic   . COPD (chronic obstructive pulmonary disease) (HCC)    mild COPD. former smoker  . Coronary artery disease   . Diabetes mellitus without complication ([place])   . Diverticulosis   . Fundic gland polyps of stomach, benign   . Gastritis   . GERD (gastroesophageal reflux disease)   . Hypercholesteremia   . Hypertension   . Prostate cancer ([place])   . Prostatism   . Reflux esophagitis   . Renal stones   . Skin cancer    left cheek/lesion excised  . Skin cancer   . Sleep apnea    . TIA (transient ischemic attack)   . TIA (transient ischemic attack)    Past Surgical History:  Procedure Laterality Date  . CARDIAC CATHETERIZATION    . COLONOSCOPY WITH PROPOFOL N/A 09/13/2015   Procedure: COLONOSCOPY WITH PROPOFOL;  Surgeon: Lollie Sails, MD;  Location: [facility] ENDOSCOPY;  Service: Endoscopy;  Laterality: N/A;  . CORONARY ANGIOPLASTY    . ESOPHAGOGASTRODUODENOSCOPY (EGD) WITH PROPOFOL N/A 09/13/2015   Procedure: ESOPHAGOGASTRODUODENOSCOPY (EGD) WITH PROPOFOL;  Surgeon: Lollie Sails, MD;  Location: [facility] ENDOSCOPY;  Service: Endoscopy;  Laterality: N/A;  . ESOPHAGOGASTRODUODENOSCOPY (EGD) WITH PROPOFOL N/A 12/28/2015   Procedure: ESOPHAGOGASTRODUODENOSCOPY (EGD) WITH PROPOFOL;  Surgeon: Lollie Sails, MD;  Location: [facility] ENDOSCOPY;  Service: Endoscopy;  Laterality: N/A;  . ESOPHAGOGASTRODUODENOSCOPY (EGD) WITH PROPOFOL N/A 06/16/2016   Procedure: ESOPHAGOGASTRODUODENOSCOPY (EGD) WITH PROPOFOL;  Surgeon: Lollie Sails, MD;  Location: [facility] ENDOSCOPY;  Service: Endoscopy;  Laterality: N/A;  . EYE SURGERY  2013   CATARACT EXTRACTION  . GANGLION CYST EXCISION Right 05/18/2017   Procedure: REMOVAL GANGLION CYST ANKLE;  Surgeon: Samara Deist, DPM;  Location: [facility] ORS;  Service: Podiatry;  Laterality: Right;  . heart cath stent    . PROSTATE BIOPSY    . stents  multiple   Family History  Problem Relation Age of Onset  . Cancer Mother        gastric and lung  . Cancer Father        multiple myeloma  . Stroke Father   . Cancer Sister        leukemia  . Cancer Brother        leukemia  . Cancer Brother        kidney  . Cancer Daughter        Uterine  . Cancer Other        [place] (Sister's Son): Prostate   Social History   Socioeconomic History  . Marital status: Married    Spouse name: Not on file  . Number of children: Not on file  . Years of education: Not on file  . Highest education level: Not on file  Occupational History  . Not on  file  Social Needs  . Financial resource strain: Not hard at all  . Food insecurity:    Worry: Never true    Inability: Never true  . Transportation needs:    Medical: No    Non-medical: No  Tobacco Use  . Smoking status: Former Smoker    Packs/day: 1.00    Years: 35.00    Pack years: 35.00    Types: Cigarettes    Last attempt to quit: 03/06/1986    Years since quitting: 31.7  . Smokeless tobacco: Former Systems developer    Types: Chew  Substance and Sexual Activity  . Alcohol use: No  . Drug use: No  . Sexual activity: Not Currently  Lifestyle  . Physical activity:    Days per week: Not on file    Minutes per session: Not on file  . Stress: Not on file  Relationships  . Social connections:    Talks on phone: Not on file    Gets together: Not on file    Attends religious service: Not on file    Active member of club or organization: Not on file    Attends meetings of clubs or organizations: Not on file    Relationship status: Not on file  Other Topics Concern  . Not on file  Social History Narrative  . Not on file    Outpatient Encounter Medications as of 11/30/2017  Medication Sig  . abiraterone acetate (ZYTIGA) 250 MG tablet Take 250 mg by mouth daily.  [name] amiodarone (PACERONE) 400 MG tablet Take 1 tablet (400 mg total) by mouth daily.  [name] apixaban (ELIQUIS) 5 MG TABS tablet Take 1 tablet (5 mg total) by mouth 2 (two) times daily.  . calcium citrate-vitamin D (CITRACAL+D) 315-200 MG-UNIT tablet Take 1 tablet by mouth daily.  . cholecalciferol (VITAMIN D) 1000 units tablet Take 1,000 Units by mouth daily.   . clopidogrel (PLAVIX) 75 MG tablet Take 1 tablet (75 mg total) by mouth daily.  . fexofenadine (ALLEGRA) 180 MG tablet Take 180 mg by mouth daily as needed for allergies or rhinitis.  . fluticasone (FLONASE) 50 MCG/ACT nasal spray Place 2 sprays into both nostrils daily as needed for allergies.   . hydrochlorothiazide (HYDRODIURIL) 12.5 MG tablet Take 1 tablet (12.5 mg total)  by mouth daily. In AM.  . Ipratropium-Albuterol (COMBIVENT) 20-100 MCG/ACT AERS respimat 2 puffs qid prn (Patient taking differently: Inhale 2 puffs into the lungs every 6 (six) hours as needed for wheezing. )  . isosorbide mononitrate (IMDUR) 30 MG 24 hr tablet TAKE 1 TABLET  BY MOUTH ONCE DAILY  . Leuprolide Acetate, 6 Month, (LUPRON) 45 MG injection Inject 45 mg into the muscle every 6 (six) months.  [name] losartan (COZAAR) 50 MG tablet Take 50 mg by mouth daily.  [name] lovastatin (MEVACOR) 40 MG tablet TAKE 2 TABLETS BY MOUTH AT BEDTIME  . Multiple Vitamin (MULTI-VITAMINS) TABS Take 1 tablet by mouth daily.   . nitroGLYCERIN (NITROSTAT) 0.4 MG SL tablet Place 0.4 mg under the tongue every 5 (five) minutes as needed for chest pain.  . pantoprazole (PROTONIX) 40 MG tablet Take 40 mg by mouth 2 (two) times daily.   . Polyethylene Glycol 3350 (MIRALAX PO) Take 1 packet by mouth daily as needed (constipation).   . ranitidine (ZANTAC) 150 MG tablet Take 150 mg by mouth daily.    No facility-administered encounter medications on file as of 11/30/2017.     Review of Systems  Constitutional: Positive for fatigue. Negative for appetite change.  HENT: Negative for congestion.   Respiratory: Positive for chest tightness. Negative for cough and shortness of breath.   Cardiovascular: Positive for chest pain. Negative for palpitations.       Swelling better with compression hose.    Gastrointestinal: Negative for abdominal pain, diarrhea, nausea and vomiting.  Genitourinary: Negative for difficulty urinating and dysuria.  Musculoskeletal: Negative for joint swelling and myalgias.  Skin: Negative for color change and rash.  Neurological: Positive for dizziness and light-headedness. Negative for headaches.  Psychiatric/Behavioral: Negative for agitation and dysphoric mood.       Objective:    Physical Exam  Constitutional: He appears well-developed and well-nourished. No distress.  HENT:  Mouth/Throat:  Oropharynx is clear and moist.  Neck: Neck supple.  Cardiovascular: Normal rate and regular rhythm.  Pulmonary/Chest: Effort normal and breath sounds normal. No respiratory distress.  Abdominal: Soft. Bowel sounds are normal. There is no tenderness.  Musculoskeletal: He exhibits no tenderness.  Lower extremity swelling improved with compression hose.    Lymphadenopathy:    He has no cervical adenopathy.  Skin: No rash noted. No erythema.  Psychiatric: He has a normal mood and affect. His behavior is normal.    BP 136/72 (BP Location: Left Arm, Patient Position: Sitting, Cuff Size: Normal)   Pulse 72   Temp (!) 97.4 F (36.3 C) (Oral)   Resp 18   Wt 199 lb 9.6 oz (90.5 kg)   SpO2 97%   BMI 29.05 kg/m  Wt Readings from Last 3 Encounters:  11/30/17 199 lb 9.6 oz (90.5 kg)  11/28/17 197 lb 11.2 oz (89.7 kg)  11/08/17 201 lb (91.2 kg)     Lab Results  Component Value Date   WBC 5.4 11/27/2017   HGB 13.7 11/27/2017   HCT 39.7 (L) 11/27/2017   PLT 182 11/27/2017   GLUCOSE 123 (H) 11/27/2017   CHOL 137 07/09/2017   TRIG 92.0 07/09/2017   HDL 33.30 (L) 07/09/2017   LDLCALC 85 07/09/2017   ALT 17 11/08/2017   AST 26 11/08/2017   NA 140 11/27/2017   K 3.5 11/28/2017   CL 111 11/27/2017   CREATININE 1.01 11/27/2017   BUN 19 11/27/2017   CO2 23 11/27/2017   TSH 2.864 11/26/2017   PSA 5.47 (H) 08/03/2016   HGBA1C 5.7 07/09/2017    Dg Chest Port 1 View  Result Date: 11/27/2017 CLINICAL DATA:  82 year old male with chest pain. History of prostate cancer. EXAM: PORTABLE CHEST 1 VIEW COMPARISON:  None. FINDINGS: A 7 mm nodular density in the right  upper lobe overlying the right posterior fourth rib may represent a calcified granuloma or a sclerotic bone lesion. The lungs are otherwise clear. There is no pleural effusion or pneumothorax. Mild cardiomegaly. No acute osseous pathology. IMPRESSION: No active disease. Electronically Signed   By: Anner Crete M.D.   On: 11/27/2017  02:40       Assessment & Plan:   Problem List Items Addressed This Visit    Atrial fibrillation, new onset [facility])    Admitted recently and diagnosed with afib.  On amiodarone.  Ventricular rate 60s today.  In SR.  On eliquis.  Discussed his current symptoms with his cardiologist (Dr Chancy Milroy).  Concern regarding intermittent chest discomfort with associated symptoms.  Will continue current amiodarone dose given ventricular rate 60s.  Hold eliquis, given plan for possible heart catherization.  Restart aspirin.       Barrett's esophagus    Followed by GI.  On protonix bid.  Follow.        CAD (coronary artery disease)    Has known CAD.  S/p previous stent placement.  With persistent intermittent chest discomfort and associated dizziness/light headedness.  Discussed with his cardiologist Dr Chancy Milroy.  On isosorbide.  Has f/u planned with Dr Chancy Milroy Monday 12/03/17.  Will stop eliquis.  Restart aspirin.  Continues on plavix. Keep f/u Monday with plans for possible heart catheterization.  Discussed at length with the patient and his family.  Currently without pain.  Will keep appt, but will go to ER if any recurring symptoms.  Desires no ER evaluation at this time.        Carotid artery disease ([place])    Had CTA.  No hemodynamically significant stenosis.        CKD (chronic kidney disease)    Follow metabolic panel.  Avoid antiinflammatories.        Dizziness    Had CT head - unrevealing.  Stress test - unrevealing.  W/up through Dr Chancy Milroy as outlined previously.  Saw ENT.  Did not feel related to peripheral vestibulopathy.  Has seen neurology.  Undergoing further w/up as outlined.  Question of need for event monitor.  Plan f/u with cardiology Monday.       Hypertension, essential    Blood pressure as outlined.  Continue same medication regimen.  Follow.         Other Visit Diagnoses    Chest pain, unspecified type    -  Primary   Relevant Orders   EKG 12-Lead (Completed)       Einar Pheasant,  MD

## 2017-12-01 NOTE — Assessment & Plan Note (Signed)
Had CTA.  No hemodynamically significant stenosis.   

## 2017-12-01 NOTE — Assessment & Plan Note (Signed)
Follow metabolic panel.  Avoid antiinflammatories.   

## 2017-12-01 NOTE — Assessment & Plan Note (Signed)
Had CT head - unrevealing.  Stress test - unrevealing.  W/up through Dr Chancy Milroy as outlined previously.  Saw ENT.  Did not feel related to peripheral vestibulopathy.  Has seen neurology.  Undergoing further w/up as outlined.  Question of need for event monitor.  Plan f/u with cardiology Monday.

## 2017-12-01 NOTE — Assessment & Plan Note (Signed)
Followed by GI.  On protonix bid.  Follow.

## 2017-12-01 NOTE — Assessment & Plan Note (Signed)
Admitted recently and diagnosed with afib.  On amiodarone.  Ventricular rate 60s today.  In SR.  On eliquis.  Discussed his current symptoms with his cardiologist (Dr Chancy Milroy).  Concern regarding intermittent chest discomfort with associated symptoms.  Will continue current amiodarone dose given ventricular rate 60s.  Hold eliquis, given plan for possible heart catherization.  Restart aspirin.

## 2017-12-01 NOTE — Assessment & Plan Note (Signed)
Blood pressure as outlined.  Continue same medication regimen.  Follow.

## 2017-12-01 NOTE — Assessment & Plan Note (Addendum)
Has known CAD.  S/p previous stent placement.  With persistent intermittent chest discomfort and associated dizziness/light headedness.  Discussed with his cardiologist Dr Chancy Milroy.  On isosorbide.  Has f/u planned with Dr Chancy Milroy Monday 12/03/17.  Will stop eliquis.  Restart aspirin.  Continues on plavix. Keep f/u Monday with plans for possible heart catheterization.  Discussed at length with the patient and his family.  Currently without pain.  Will keep appt, but will go to ER if any recurring symptoms.  Desires no ER evaluation at this time.

## 2017-12-02 ENCOUNTER — Ambulatory Visit: Payer: Self-pay | Admitting: Cardiovascular Disease

## 2017-12-03 ENCOUNTER — Encounter
Admission: RE | Disposition: A | Discharge status: Home / Self Care | Payer: Self-pay | Source: Ambulatory Visit | Attending: Cardiovascular Disease

## 2017-12-03 ENCOUNTER — Ambulatory Visit: Payer: Medicare HMO | Admitting: Certified Registered Nurse Anesthetist

## 2017-12-03 ENCOUNTER — Telehealth: Payer: Self-pay | Admitting: Internal Medicine

## 2017-12-03 ENCOUNTER — Ambulatory Visit
Admission: RE | Admit: 2017-12-03 | Discharge: 2017-12-03 | Disposition: A | Discharge status: Home / Self Care | Payer: Medicare HMO | Source: Ambulatory Visit | Attending: Cardiovascular Disease | Admitting: Cardiovascular Disease

## 2017-12-03 ENCOUNTER — Ambulatory Visit: Payer: Medicare HMO

## 2017-12-03 DIAGNOSIS — Z800 Family history of malignant neoplasm of digestive organs: Secondary | ICD-10-CM | POA: Diagnosis not present

## 2017-12-03 DIAGNOSIS — I129 Hypertensive chronic kidney disease with stage 1 through stage 4 chronic kidney disease, or unspecified chronic kidney disease: Secondary | ICD-10-CM | POA: Insufficient documentation

## 2017-12-03 DIAGNOSIS — G4730 Sleep apnea, unspecified: Secondary | ICD-10-CM | POA: Insufficient documentation

## 2017-12-03 DIAGNOSIS — Z87891 Personal history of nicotine dependence: Secondary | ICD-10-CM | POA: Insufficient documentation

## 2017-12-03 DIAGNOSIS — E7800 Pure hypercholesterolemia, unspecified: Secondary | ICD-10-CM | POA: Insufficient documentation

## 2017-12-03 DIAGNOSIS — Z79899 Other long term (current) drug therapy: Secondary | ICD-10-CM | POA: Diagnosis not present

## 2017-12-03 DIAGNOSIS — Z888 Allergy status to other drugs, medicaments and biological substances status: Secondary | ICD-10-CM | POA: Insufficient documentation

## 2017-12-03 DIAGNOSIS — Z85828 Personal history of other malignant neoplasm of skin: Secondary | ICD-10-CM | POA: Diagnosis not present

## 2017-12-03 DIAGNOSIS — K219 Gastro-esophageal reflux disease without esophagitis: Secondary | ICD-10-CM | POA: Diagnosis not present

## 2017-12-03 DIAGNOSIS — Z801 Family history of malignant neoplasm of trachea, bronchus and lung: Secondary | ICD-10-CM | POA: Diagnosis not present

## 2017-12-03 DIAGNOSIS — N189 Chronic kidney disease, unspecified: Secondary | ICD-10-CM | POA: Insufficient documentation

## 2017-12-03 DIAGNOSIS — K2270 Barrett's esophagus without dysplasia: Secondary | ICD-10-CM | POA: Diagnosis not present

## 2017-12-03 DIAGNOSIS — J449 Chronic obstructive pulmonary disease, unspecified: Secondary | ICD-10-CM | POA: Insufficient documentation

## 2017-12-03 DIAGNOSIS — Z87442 Personal history of urinary calculi: Secondary | ICD-10-CM | POA: Insufficient documentation

## 2017-12-03 DIAGNOSIS — Z955 Presence of coronary angioplasty implant and graft: Secondary | ICD-10-CM | POA: Diagnosis not present

## 2017-12-03 DIAGNOSIS — I4891 Unspecified atrial fibrillation: Secondary | ICD-10-CM | POA: Insufficient documentation

## 2017-12-03 DIAGNOSIS — Z823 Family history of stroke: Secondary | ICD-10-CM | POA: Insufficient documentation

## 2017-12-03 DIAGNOSIS — Z8546 Personal history of malignant neoplasm of prostate: Secondary | ICD-10-CM | POA: Diagnosis not present

## 2017-12-03 DIAGNOSIS — Z7901 Long term (current) use of anticoagulants: Secondary | ICD-10-CM | POA: Insufficient documentation

## 2017-12-03 DIAGNOSIS — Z8042 Family history of malignant neoplasm of prostate: Secondary | ICD-10-CM | POA: Insufficient documentation

## 2017-12-03 DIAGNOSIS — Z8049 Family history of malignant neoplasm of other genital organs: Secondary | ICD-10-CM | POA: Insufficient documentation

## 2017-12-03 DIAGNOSIS — Z85830 Personal history of malignant neoplasm of bone: Secondary | ICD-10-CM | POA: Diagnosis not present

## 2017-12-03 DIAGNOSIS — Z86010 Personal history of colonic polyps: Secondary | ICD-10-CM | POA: Diagnosis not present

## 2017-12-03 DIAGNOSIS — Z7982 Long term (current) use of aspirin: Secondary | ICD-10-CM | POA: Insufficient documentation

## 2017-12-03 DIAGNOSIS — Z8673 Personal history of transient ischemic attack (TIA), and cerebral infarction without residual deficits: Secondary | ICD-10-CM | POA: Insufficient documentation

## 2017-12-03 DIAGNOSIS — R079 Chest pain, unspecified: Secondary | ICD-10-CM | POA: Diagnosis not present

## 2017-12-03 DIAGNOSIS — Z8051 Family history of malignant neoplasm of kidney: Secondary | ICD-10-CM | POA: Insufficient documentation

## 2017-12-03 DIAGNOSIS — Z807 Family history of other malignant neoplasms of lymphoid, hematopoietic and related tissues: Secondary | ICD-10-CM | POA: Insufficient documentation

## 2017-12-03 DIAGNOSIS — I25110 Atherosclerotic heart disease of native coronary artery with unstable angina pectoris: Secondary | ICD-10-CM | POA: Diagnosis not present

## 2017-12-03 DIAGNOSIS — I200 Unstable angina: Secondary | ICD-10-CM | POA: Diagnosis not present

## 2017-12-03 DIAGNOSIS — I249 Acute ischemic heart disease, unspecified: Secondary | ICD-10-CM | POA: Diagnosis not present

## 2017-12-03 HISTORY — PX: LEFT HEART CATH AND CORONARY ANGIOGRAPHY: CATH118249

## 2017-12-03 SURGERY — LEFT HEART CATH AND CORONARY ANGIOGRAPHY
Anesthesia: Moderate Sedation | Laterality: Right

## 2017-12-03 MED ORDER — SODIUM CHLORIDE 0.9 % IV SOLN: 250.0000 mL | INTRAVENOUS | Status: DC | PRN

## 2017-12-03 MED ORDER — HYDRALAZINE HCL 20 MG/ML IJ SOLN
INTRAMUSCULAR | Status: DC | PRN
  Administered 2017-12-03: 10 mg via INTRAVENOUS

## 2017-12-03 MED ORDER — HYDRALAZINE HCL 20 MG/ML IJ SOLN
INTRAMUSCULAR | Status: AC
  Filled 2017-12-03: qty 1

## 2017-12-03 MED ORDER — SODIUM CHLORIDE 0.9 % WEIGHT BASED INFUSION: 3.0000 mL/kg/h | INTRAVENOUS | Status: DC

## 2017-12-03 MED ORDER — SODIUM CHLORIDE 0.9 % WEIGHT BASED INFUSION: 1.0000 mL/kg/h | INTRAVENOUS | Status: DC

## 2017-12-03 MED ORDER — IOPAMIDOL (ISOVUE-300) INJECTION 61%
INTRAVENOUS | Status: DC | PRN
  Administered 2017-12-03: 100 mL via INTRA_ARTERIAL

## 2017-12-03 MED ORDER — SODIUM CHLORIDE 0.9% FLUSH: 3.0000 mL | INTRAVENOUS | Status: DC | PRN

## 2017-12-03 MED ORDER — ASPIRIN 81 MG PO CHEW
CHEWABLE_TABLET | ORAL | Status: AC
  Filled 2017-12-03: qty 1

## 2017-12-03 MED ORDER — HEPARIN (PORCINE) IN NACL 1000-0.9 UT/500ML-% IV SOLN
INTRAVENOUS | Status: AC
  Filled 2017-12-03: qty 1000

## 2017-12-03 MED ORDER — ONDANSETRON HCL 4 MG/2ML IJ SOLN: 4.0000 mg | Freq: Four times a day (QID) | INTRAMUSCULAR | Status: DC | PRN

## 2017-12-03 MED ORDER — FENTANYL CITRATE (PF) 100 MCG/2ML IJ SOLN
INTRAMUSCULAR | Status: AC
  Filled 2017-12-03: qty 2

## 2017-12-03 MED ORDER — MIDAZOLAM HCL 2 MG/2ML IJ SOLN
INTRAMUSCULAR | Status: AC
  Filled 2017-12-03: qty 2

## 2017-12-03 MED ORDER — ACETAMINOPHEN 325 MG PO TABS: 650.0000 mg | ORAL_TABLET | ORAL | Status: DC | PRN

## 2017-12-03 MED ORDER — SODIUM CHLORIDE 0.9% FLUSH: 3.0000 mL | Freq: Two times a day (BID) | INTRAVENOUS | Status: DC

## 2017-12-03 MED ORDER — FENTANYL CITRATE (PF) 100 MCG/2ML IJ SOLN
INTRAMUSCULAR | Status: DC | PRN
  Administered 2017-12-03: 50 ug via INTRAVENOUS

## 2017-12-03 MED ORDER — ASPIRIN 81 MG PO CHEW
81.0000 mg | CHEWABLE_TABLET | ORAL | Status: AC
  Administered 2017-12-03: 81 mg via ORAL

## 2017-12-03 MED ORDER — MIDAZOLAM HCL 2 MG/2ML IJ SOLN
INTRAMUSCULAR | Status: DC | PRN
  Administered 2017-12-03: 1 mg via INTRAVENOUS

## 2017-12-03 SURGICAL SUPPLY — 13 items
CATH INFINITI 5 FR JL3.5 (CATHETERS) ×1 IMPLANT
CATH INFINITI 5FR AL1 (CATHETERS) ×1 IMPLANT
CATH INFINITI 5FR ANG PIGTAIL (CATHETERS) ×1 IMPLANT
CATH INFINITI 5FR JL4 (CATHETERS) ×1 IMPLANT
CATH INFINITI 5FR JL5 (CATHETERS) ×1 IMPLANT
CATH INFINITI JR4 5F (CATHETERS) ×1 IMPLANT
DEVICE CLOSURE MYNXGRIP 5F (Vascular Products) ×1 IMPLANT
KIT MANI 3VAL PERCEP (MISCELLANEOUS) ×2 IMPLANT
NDL PERC 18GX7CM (NEEDLE) IMPLANT
NEEDLE PERC 18GX7CM (NEEDLE) ×2 IMPLANT
PACK CARDIAC CATH (CUSTOM PROCEDURE TRAY) ×2 IMPLANT
SHEATH AVANTI 5FR X 11CM (SHEATH) ×1 IMPLANT
WIRE GUIDERIGHT .035X150 (WIRE) ×1 IMPLANT

## 2017-12-03 NOTE — Telephone Encounter (Signed)
Pt wanted to speak with Dr. Nicki Reaper about her husband. He just got out of the hospital today.

## 2017-12-03 NOTE — Consult Note (Signed)
Nathaniel Thompson is a 82 y.o. male  [id number]  Primary Cardiologist: Neoma Laming Reason for Consultation: Chest pain and unstable angina  HPI: This is a 82 year old white male with history of atrial fibrillation with rapid ventricular response rate presented to the hospital with chest pain palpitation which happened last Friday.  He was admitted on Tuesday up till Wednesday with similar symptoms chest pain and syncope.   Review of Systems: No orthopnea PND or leg swelling but feels dizzy   Past Medical History:  Diagnosis Date  . Allergic rhinitis   . Barrett's esophagus 10/21/13  . Bone cancer ([place])   . Chronic kidney disease    stones  . Colon polyp, hyperplastic   . COPD (chronic obstructive pulmonary disease) (HCC)    mild COPD. former smoker  . Coronary artery disease   . Diverticulosis   . Fundic gland polyps of stomach, benign   . Gastritis   . GERD (gastroesophageal reflux disease)   . Hypercholesteremia   . Hypertension   . Prostate cancer ([place])   . Prostatism   . Reflux esophagitis   . Renal stones   . Skin cancer    left cheek/lesion excised  . Skin cancer   . Sleep apnea   . TIA (transient ischemic attack)   . TIA (transient ischemic attack)     Medications Prior to Admission  Medication Sig Dispense Refill  . abiraterone acetate (ZYTIGA) 250 MG tablet Take 250 mg by mouth daily.    [name] amiodarone (PACERONE) 400 MG tablet Take 1 tablet (400 mg total) by mouth daily. 30 tablet 0  . apixaban (ELIQUIS) 5 MG TABS tablet Take 5 mg by mouth 2 (two) times daily.    [name] aspirin EC 81 MG tablet Take 81 mg by mouth daily.    . calcium citrate-vitamin D (CITRACAL+D) 315-200 MG-UNIT tablet Take 1 tablet by mouth daily.    . cholecalciferol (VITAMIN D) 1000 units tablet Take 1,000 Units by mouth daily.     . clopidogrel (PLAVIX) 75 MG tablet Take 1 tablet (75 mg total) by mouth daily. 90 tablet 0  . hydrochlorothiazide (HYDRODIURIL) 12.5 MG tablet Take 1 tablet (12.5  mg total) by mouth daily. In AM. 90 tablet 1  . Ipratropium-Albuterol (COMBIVENT) 20-100 MCG/ACT AERS respimat 2 puffs qid prn (Patient taking differently: Inhale 2 puffs into the lungs every 6 (six) hours as needed for wheezing. ) 1 Inhaler 3  . isosorbide mononitrate (IMDUR) 30 MG 24 hr tablet TAKE 1 TABLET BY MOUTH ONCE DAILY 90 tablet 2  . losartan (COZAAR) 50 MG tablet Take 50 mg by mouth daily.    [name] lovastatin (MEVACOR) 40 MG tablet TAKE 2 TABLETS BY MOUTH AT BEDTIME 180 tablet 1  . Multiple Vitamin (MULTI-VITAMINS) TABS Take 1 tablet by mouth daily.     . pantoprazole (PROTONIX) 40 MG tablet Take 40 mg by mouth 2 (two) times daily.     . ranitidine (ZANTAC) 150 MG tablet Take 150 mg by mouth daily.     . fexofenadine (ALLEGRA) 180 MG tablet Take 180 mg by mouth daily as needed for allergies or rhinitis.    . fluticasone (FLONASE) 50 MCG/ACT nasal spray Place 2 sprays into both nostrils daily as needed for allergies.     [name] Leuprolide Acetate, 6 Month, (LUPRON) 45 MG injection Inject 45 mg into the muscle every 6 (six) months.    . nitroGLYCERIN (NITROSTAT) 0.4 MG SL tablet Place 0.4 mg under the  tongue every 5 (five) minutes as needed for chest pain.    . Polyethylene Glycol 3350 (MIRALAX PO) Take 1 packet by mouth daily as needed (constipation).        Derrill Memo ON 12/04/2017] aspirin  81 mg Oral Pre-Cath  . sodium chloride flush  3 mL Intravenous Q12H    Infusions: . sodium chloride    . [START ON 12/04/2017] sodium chloride     Followed by  . [START ON 12/04/2017] sodium chloride      Allergies  Allergen Reactions  . Atorvastatin Other (See Comments)    Achy joints    Social History   Socioeconomic History  . Marital status: Married    Spouse name: Not on file  . Number of children: Not on file  . Years of education: Not on file  . Highest education level: Not on file  Occupational History  . Not on file  Social Needs  . Financial resource strain: Not hard at all  .  Food insecurity:    Worry: Never true    Inability: Never true  . Transportation needs:    Medical: No    Non-medical: No  Tobacco Use  . Smoking status: Former Smoker    Packs/day: 1.00    Years: 35.00    Pack years: 35.00    Types: Cigarettes    Last attempt to quit: 03/06/1986    Years since quitting: 31.7  . Smokeless tobacco: Former Systems developer    Types: Chew  Substance and Sexual Activity  . Alcohol use: No  . Drug use: No  . Sexual activity: Not Currently  Lifestyle  . Physical activity:    Days per week: Not on file    Minutes per session: Not on file  . Stress: Not on file  Relationships  . Social connections:    Talks on phone: Not on file    Gets together: Not on file    Attends religious service: Not on file    Active member of club or organization: Not on file    Attends meetings of clubs or organizations: Not on file    Relationship status: Not on file  . Intimate partner violence:    Fear of current or ex partner: No    Emotionally abused: No    Physically abused: No    Forced sexual activity: No  Other Topics Concern  . Not on file  Social History Narrative  . Not on file    Family History  Problem Relation Age of Onset  . Cancer Mother        gastric and lung  . Cancer Father        multiple myeloma  . Stroke Father   . Cancer Sister        leukemia  . Cancer Brother        leukemia  . Cancer Brother        kidney  . Cancer Daughter        Uterine  . Cancer Other        [place] Son): Prostate    PHYSICAL EXAM: Vitals:   12/03/17 0753  BP: (!) 183/102  Pulse: (!) 50  Resp: 12  Temp: 97.6 F (36.4 C)  SpO2: 100%    No intake or output data in the 24 hours ending 12/03/17 0810  General:  Well appearing. No respiratory difficulty HEENT: normal Neck: supple. no JVD. Carotids 2+ bilat; no bruits. No lymphadenopathy or thryomegaly appreciated. Cor:  PMI nondisplaced. Regular rate & rhythm. No rubs, gallops or murmurs. Lungs:  clear Abdomen: soft, nontender, nondistended. No hepatosplenomegaly. No bruits or masses. Good bowel sounds. Extremities: no cyanosis, clubbing, rash, edema Neuro: alert & oriented x 3, cranial nerves grossly intact. moves all 4 extremities w/o difficulty. Affect pleasant.  ECG: Normal sinus rhythm 68 bpm with occasional PVCs nonspecific ST-T changes  No results found for this or any previous visit (from the past 24 hour(s)). No results found.   ASSESSMENT AND PLAN: Status post atrial fibrillation with rapid ventricular response rate on amiodarone remains in sinus rhythm but has chest pain and syncope with history of PCI and stenting of the RCA and OM1.  Advise proceeding with cardiac catheterization.  Patient was explained the risk and benefits and patient has agreed to the procedure.  Jermari Tamargo A

## 2017-12-03 NOTE — Telephone Encounter (Signed)
Agree with need for cpap.  Needs to use.  Can affect heart rhythm, rate, etc.

## 2017-12-03 NOTE — Discharge Instructions (Signed)
PER DR. KHAN, PATIENT TO HOLD AMIODARONE FOR NEXT TWO DAYS. HE IS TO CONTINUE 81MG  ASPIRIN. HE WILL FOLLOW-UP WITH DR. KHAN TOMORROW.

## 2017-12-03 NOTE — Telephone Encounter (Signed)
Patients wife wanted to thank [place] for all of our help with everything that they have going on and also wanted to let you know that patient is not using his CPAP machine and has not used it this year. She was wondering if that could have something to do with the symptoms he is having with his heart. Advised that he should be using his machine and that I would send message to you.

## 2017-12-04 ENCOUNTER — Other Ambulatory Visit: Payer: Self-pay | Admitting: Internal Medicine

## 2017-12-04 DIAGNOSIS — I10 Essential (primary) hypertension: Secondary | ICD-10-CM | POA: Diagnosis not present

## 2017-12-04 DIAGNOSIS — R55 Syncope and collapse: Secondary | ICD-10-CM | POA: Diagnosis not present

## 2017-12-04 DIAGNOSIS — I2510 Atherosclerotic heart disease of native coronary artery without angina pectoris: Secondary | ICD-10-CM | POA: Diagnosis not present

## 2017-12-04 DIAGNOSIS — C61 Malignant neoplasm of prostate: Secondary | ICD-10-CM

## 2017-12-04 DIAGNOSIS — G4730 Sleep apnea, unspecified: Secondary | ICD-10-CM | POA: Diagnosis not present

## 2017-12-04 DIAGNOSIS — K210 Gastro-esophageal reflux disease with esophagitis: Secondary | ICD-10-CM | POA: Diagnosis not present

## 2017-12-04 DIAGNOSIS — R0602 Shortness of breath: Secondary | ICD-10-CM | POA: Diagnosis not present

## 2017-12-04 DIAGNOSIS — I739 Peripheral vascular disease, unspecified: Secondary | ICD-10-CM | POA: Diagnosis not present

## 2017-12-04 DIAGNOSIS — E785 Hyperlipidemia, unspecified: Secondary | ICD-10-CM | POA: Diagnosis not present

## 2017-12-04 MED ORDER — ABIRATERONE ACETATE 250 MG PO TABS: 250.0000 mg | ORAL_TABLET | Freq: Every day | ORAL | 4 refills | Status: DC

## 2017-12-05 ENCOUNTER — Inpatient Hospital Stay: Payer: Medicare HMO | Admitting: Family Medicine

## 2017-12-05 ENCOUNTER — Ambulatory Visit: Payer: Medicare HMO

## 2017-12-05 NOTE — Telephone Encounter (Signed)
LMTCB

## 2017-12-05 NOTE — Telephone Encounter (Signed)
Does need referral to pulmonary.  If agreeable, let me know and I will place the order for the referral.

## 2017-12-05 NOTE — Telephone Encounter (Signed)
Spoke with patients wife to make her aware that he should be using CPAP. She says that he will need new supplies and it has been awhile since he had a study so he may need new study. Do we need refer to pulmonary? I told her I would let her know either way what we needed to do.

## 2017-12-06 NOTE — Telephone Encounter (Signed)
Pt found old machine and supplies but is still agreeable to referral to pulmonary. Advised that someone would be calling to set up appt

## 2017-12-06 NOTE — Telephone Encounter (Signed)
Order has been placed for referral.  Agree with reassessment, to see what pressure, etc needs.  Someone should be contacting with appt date and time.

## 2017-12-10 ENCOUNTER — Encounter: Payer: Self-pay | Admitting: Internal Medicine

## 2017-12-10 ENCOUNTER — Ambulatory Visit: Payer: Medicare HMO | Admitting: Internal Medicine

## 2017-12-10 ENCOUNTER — Ambulatory Visit: Payer: Medicare HMO

## 2017-12-10 VITALS — BP 148/78 | HR 80 | Resp 16 | Ht 69.5 in | Wt 206.0 lb

## 2017-12-10 DIAGNOSIS — G4733 Obstructive sleep apnea (adult) (pediatric): Secondary | ICD-10-CM | POA: Diagnosis not present

## 2017-12-10 DIAGNOSIS — R55 Syncope and collapse: Secondary | ICD-10-CM | POA: Diagnosis not present

## 2017-12-10 NOTE — Addendum Note (Signed)
Addended by: Stephanie Coup on: 12/10/2017 09:41 AM   Modules accepted: Orders

## 2017-12-10 NOTE — Progress Notes (Signed)
[facility] Consultation      Assessment and Plan:  Obstructive sleep apnea. - Known history of obstructive sleep apnea, currently he has malfunctioning with his current machine.  Also complained of excessive dry mouth, which is making him intolerant. -We will order new CPAP AutoPap range 5-20.  Atrial fibrillation, transient ischemic attack, essential hypertension. - Above conditions can be contributed to by obstructive sleep apnea, therefore treatment of sleep apnea is important part of their management.  Return in about 3 months (around 03/12/2018).   Date: 12/10/2017  MRN# [id number] Nathaniel Thompson 01-16-1936    Nathaniel Thompson is a 82 y.o. old male seen in consultation for chief complaint of:    Chief Complaint  Patient presents with  . Consult    Referred by Dr. Nicki Reaper sleep apnea eval  . Sleep Apnea    pt was on cpap but stopped using for a year. He recently restarted but not nightly.    HPI:   The patient is an 82 year old male with history of obstructive sleep apnea, atrial fibrillation.  He had previously been on CPAP and was using it regularly but stopped using it about a year ago. He noted that he was getting dry mouth, also the CPAP was not turning off in the AM and he would have to unplug it.  He is sleepy during the day, he was diagnosed with Afib, he was hospitalized recently.  He tried using his old CPAP machine after the hospitalization, he is still waking up with dry mouth.   **Review of old records, previous sleep study 12/02/2009.  AHI of 20, consistent with moderate obstructive sleep apnea.  CPAP was recommended.   PMHX:   Past Medical History:  Diagnosis Date  . Allergic rhinitis   . Barrett's esophagus 10/21/13  . Bone cancer ([place])   . Chronic kidney disease    stones  . Colon polyp, hyperplastic   . COPD (chronic obstructive pulmonary disease) (HCC)    mild COPD. former smoker  . Coronary artery disease   . Diverticulosis   .  Fundic gland polyps of stomach, benign   . Gastritis   . GERD (gastroesophageal reflux disease)   . Hypercholesteremia   . Hypertension   . Prostate cancer ([place])   . Prostatism   . Reflux esophagitis   . Renal stones   . Skin cancer    left cheek/lesion excised  . Skin cancer   . Sleep apnea   . TIA (transient ischemic attack)   . TIA (transient ischemic attack)    Surgical Hx:  Past Surgical History:  Procedure Laterality Date  . CARDIAC CATHETERIZATION    . COLONOSCOPY WITH PROPOFOL N/A 09/13/2015   Procedure: COLONOSCOPY WITH PROPOFOL;  Surgeon: Lollie Sails, MD;  Location: [facility] ENDOSCOPY;  Service: Endoscopy;  Laterality: N/A;  . CORONARY ANGIOPLASTY    . ESOPHAGOGASTRODUODENOSCOPY (EGD) WITH PROPOFOL N/A 09/13/2015   Procedure: ESOPHAGOGASTRODUODENOSCOPY (EGD) WITH PROPOFOL;  Surgeon: Lollie Sails, MD;  Location: [facility] ENDOSCOPY;  Service: Endoscopy;  Laterality: N/A;  . ESOPHAGOGASTRODUODENOSCOPY (EGD) WITH PROPOFOL N/A 12/28/2015   Procedure: ESOPHAGOGASTRODUODENOSCOPY (EGD) WITH PROPOFOL;  Surgeon: Lollie Sails, MD;  Location: [facility] ENDOSCOPY;  Service: Endoscopy;  Laterality: N/A;  . ESOPHAGOGASTRODUODENOSCOPY (EGD) WITH PROPOFOL N/A 06/16/2016   Procedure: ESOPHAGOGASTRODUODENOSCOPY (EGD) WITH PROPOFOL;  Surgeon: Lollie Sails, MD;  Location: [facility] ENDOSCOPY;  Service: Endoscopy;  Laterality: N/A;  . EYE SURGERY  2013   CATARACT EXTRACTION  . GANGLION CYST EXCISION  Right 05/18/2017   Procedure: REMOVAL GANGLION CYST ANKLE;  Surgeon: Samara Deist, DPM;  Location: [facility] ORS;  Service: Podiatry;  Laterality: Right;  . heart cath stent    . LEFT HEART CATH AND CORONARY ANGIOGRAPHY Right 12/03/2017   Procedure: Left Heart Cath and Coronary Angiography with possible coronary intervention;  Surgeon: Dionisio David, MD;  Location: [place] CV LAB;  Service: Cardiovascular;  Laterality: Right;  . PROSTATE BIOPSY    . stents     multiple   Family Hx:  Family  History  Problem Relation Age of Onset  . Cancer Mother        gastric and lung  . Cancer Father        multiple myeloma  . Stroke Father   . Cancer Sister        leukemia  . Cancer Brother        leukemia  . Cancer Brother        kidney  . Cancer Daughter        Uterine  . Cancer Other        Nephes (Sister's Son): Prostate   Social Hx:   Social History   Tobacco Use  . Smoking status: Former Smoker    Packs/day: 1.00    Years: 35.00    Pack years: 35.00    Types: Cigarettes    Last attempt to quit: 03/06/1986    Years since quitting: 31.7  . Smokeless tobacco: Former Systems developer    Types: Chew  Substance Use Topics  . Alcohol use: No  . Drug use: No   Medication:    Current Outpatient Medications:  .  abiraterone acetate (ZYTIGA) 250 MG tablet, Take 1 tablet (250 mg total) by mouth daily., Disp: 30 tablet, Rfl: 4 .  amiodarone (PACERONE) 400 MG tablet, Take 1 tablet (400 mg total) by mouth daily., Disp: 30 tablet, Rfl: 0 .  aspirin EC 81 MG tablet, Take 81 mg by mouth daily., Disp: , Rfl:  .  calcium citrate-vitamin D (CITRACAL+D) 315-200 MG-UNIT tablet, Take 1 tablet by mouth daily., Disp: , Rfl:  .  cholecalciferol (VITAMIN D) 1000 units tablet, Take 1,000 Units by mouth daily. , Disp: , Rfl:  .  clopidogrel (PLAVIX) 75 MG tablet, Take 1 tablet (75 mg total) by mouth daily., Disp: 90 tablet, Rfl: 0 .  fexofenadine (ALLEGRA) 180 MG tablet, Take 180 mg by mouth daily as needed for allergies or rhinitis., Disp: , Rfl:  .  fluticasone (FLONASE) 50 MCG/ACT nasal spray, Place 2 sprays into both nostrils daily as needed for allergies. , Disp: , Rfl:  .  hydrochlorothiazide (HYDRODIURIL) 12.5 MG tablet, Take 1 tablet (12.5 mg total) by mouth daily. In AM., Disp: 90 tablet, Rfl: 1 .  Ipratropium-Albuterol (COMBIVENT) 20-100 MCG/ACT AERS respimat, 2 puffs qid prn (Patient taking differently: Inhale 2 puffs into the lungs every 6 (six) hours as needed for wheezing. ), Disp: 1 Inhaler,  Rfl: 3 .  isosorbide mononitrate (IMDUR) 30 MG 24 hr tablet, TAKE 1 TABLET BY MOUTH ONCE DAILY, Disp: 90 tablet, Rfl: 2 .  Leuprolide Acetate, 6 Month, (LUPRON) 45 MG injection, Inject 45 mg into the muscle every 6 (six) months., Disp: , Rfl:  .  losartan (COZAAR) 50 MG tablet, Take 50 mg by mouth daily., Disp: , Rfl:  .  lovastatin (MEVACOR) 40 MG tablet, TAKE 2 TABLETS BY MOUTH AT BEDTIME, Disp: 180 tablet, Rfl: 1 .  Multiple Vitamin (MULTI-VITAMINS) TABS, Take 1 tablet  by mouth daily. , Disp: , Rfl:  .  nitroGLYCERIN (NITROSTAT) 0.4 MG SL tablet, Place 0.4 mg under the tongue every 5 (five) minutes as needed for chest pain., Disp: , Rfl:  .  pantoprazole (PROTONIX) 40 MG tablet, Take 40 mg by mouth 2 (two) times daily. , Disp: , Rfl:  .  Polyethylene Glycol 3350 (MIRALAX PO), Take 1 packet by mouth daily as needed (constipation). , Disp: , Rfl:  .  ranitidine (ZANTAC) 150 MG tablet, Take 150 mg by mouth daily. , Disp: , Rfl:    Allergies:  Atorvastatin  Review of Systems: Gen:  Denies  fever, sweats, chills HEENT: Denies blurred vision, double vision. bleeds, sore throat Cvc:  No dizziness, chest pain. Resp:   Denies cough or sputum production, shortness of breath Gi: Denies swallowing difficulty, stomach pain. Gu:  Denies bladder incontinence, burning urine Ext:   No Joint pain, stiffness. Skin: No skin rash,  hives  Endoc:  No polyuria, polydipsia. Psych: No depression, insomnia. Other:  All other systems were reviewed with the patient and were negative other that what is mentioned in the HPI.   Physical Examination:   VS: BP (!) 148/78 (BP Location: Left Arm, Cuff Size: Large)   Pulse 80   Resp 16   Ht 5' 9.5" (1.765 m)   Wt 206 lb (93.4 kg)   SpO2 96%   BMI 29.98 kg/m   General Appearance: No distress  Neuro:without focal findings,  speech normal,  HEENT: PERRLA, EOM intact.   Pulmonary: normal breath sounds, No wheezing.  CardiovascularNormal S1,S2.  No m/r/g.     Abdomen: Benign, Soft, non-tender. Renal:  No costovertebral tenderness  GU:  No performed at this time. Endoc: No evident thyromegaly, no signs of acromegaly. Skin:   warm, no rashes, no ecchymosis  Extremities: normal, no cyanosis, clubbing.  Other findings:    LABORATORY PANEL:   CBC No results for input(s): WBC, HGB, HCT, PLT in the last 168 hours. ------------------------------------------------------------------------------------------------------------------  Chemistries  No results for input(s): NA, K, CL, CO2, GLUCOSE, BUN, CREATININE, CALCIUM, MG, AST, ALT, ALKPHOS, BILITOT in the last 168 hours.  Invalid input(s): GFRCGP ------------------------------------------------------------------------------------------------------------------  Cardiac Enzymes No results for input(s): TROPONINI in the last 168 hours. ------------------------------------------------------------  RADIOLOGY:  No results found.     Thank  you for the consultation and for allowing [facility] to assist in the care of your patient. Our recommendations are noted above.  Please contact us if we can be of further service.   Marda Stalker, M.D., F.C.C.P.  Board Certified in Internal Medicine, Pulmonary Medicine, [place], and Sleep Medicine.  [facility] Office Number: [phone]   12/10/2017

## 2017-12-10 NOTE — Patient Instructions (Addendum)
Will order a new CPAP, auto CPAP with pressure range 5-20 cm H2O.

## 2017-12-11 ENCOUNTER — Inpatient Hospital Stay: Payer: Medicare HMO | Admitting: Family Medicine

## 2017-12-12 ENCOUNTER — Ambulatory Visit: Payer: Medicare HMO

## 2017-12-12 DIAGNOSIS — I2510 Atherosclerotic heart disease of native coronary artery without angina pectoris: Secondary | ICD-10-CM | POA: Diagnosis not present

## 2017-12-12 DIAGNOSIS — I10 Essential (primary) hypertension: Secondary | ICD-10-CM | POA: Diagnosis not present

## 2017-12-12 DIAGNOSIS — G4730 Sleep apnea, unspecified: Secondary | ICD-10-CM | POA: Diagnosis not present

## 2017-12-12 DIAGNOSIS — I4891 Unspecified atrial fibrillation: Secondary | ICD-10-CM | POA: Diagnosis not present

## 2017-12-12 MED FILL — ABIRATERONE ACETATE 250 MG: 250 | 30 days supply | Qty: 30 | Fill #0

## 2017-12-13 DIAGNOSIS — G4733 Obstructive sleep apnea (adult) (pediatric): Secondary | ICD-10-CM | POA: Diagnosis not present

## 2017-12-18 ENCOUNTER — Other Ambulatory Visit: Payer: Self-pay | Admitting: Internal Medicine

## 2018-01-01 ENCOUNTER — Ambulatory Visit: Payer: Medicare HMO | Admitting: Internal Medicine

## 2018-01-01 ENCOUNTER — Telehealth: Payer: Self-pay | Admitting: Internal Medicine

## 2018-01-01 ENCOUNTER — Encounter: Payer: Self-pay | Admitting: Internal Medicine

## 2018-01-01 DIAGNOSIS — E7800 Pure hypercholesterolemia, unspecified: Secondary | ICD-10-CM | POA: Diagnosis not present

## 2018-01-01 DIAGNOSIS — K2270 Barrett's esophagus without dysplasia: Secondary | ICD-10-CM | POA: Diagnosis not present

## 2018-01-01 DIAGNOSIS — J449 Chronic obstructive pulmonary disease, unspecified: Secondary | ICD-10-CM

## 2018-01-01 DIAGNOSIS — N189 Chronic kidney disease, unspecified: Secondary | ICD-10-CM | POA: Diagnosis not present

## 2018-01-01 DIAGNOSIS — C61 Malignant neoplasm of prostate: Secondary | ICD-10-CM

## 2018-01-01 DIAGNOSIS — I10 Essential (primary) hypertension: Secondary | ICD-10-CM | POA: Diagnosis not present

## 2018-01-01 DIAGNOSIS — I2510 Atherosclerotic heart disease of native coronary artery without angina pectoris: Secondary | ICD-10-CM

## 2018-01-01 DIAGNOSIS — I4891 Unspecified atrial fibrillation: Secondary | ICD-10-CM | POA: Diagnosis not present

## 2018-01-01 DIAGNOSIS — G4730 Sleep apnea, unspecified: Secondary | ICD-10-CM

## 2018-01-01 DIAGNOSIS — R739 Hyperglycemia, unspecified: Secondary | ICD-10-CM | POA: Diagnosis not present

## 2018-01-01 NOTE — Progress Notes (Signed)
Patient ID: Nathaniel Thompson, male   DOB: [birth year], 82 y.o.   MRN: [id number]   Subjective:    Patient ID: Nathaniel Thompson, male    DOB: [birth year], 82 y.o.   MRN: [id number]  HPI  Patient here for a scheduled follow up.  He is accompanied by his wife.  History obtained from both of them.  He is doing better.  Feels better.  Energy better.  Saw Dr Ashby Dawes.  Has cpap now.  Using.  Getting adjusted.  No chest pain.  On amiodarone qod now.  Off eliquis.  Seeing cardiology.  Adjusting medications.  Had cath.  No significant CAD.  Breathing stable.  No acid reflux.  No abdominal pain.  Bowels moving.     Past Medical History:  Diagnosis Date  . Allergic rhinitis   . Barrett's esophagus 10/21/13  . Bone cancer ([place])   . Chronic kidney disease    stones  . Colon polyp, hyperplastic   . COPD (chronic obstructive pulmonary disease) (HCC)    mild COPD. former smoker  . Coronary artery disease   . Diverticulosis   . Fundic gland polyps of stomach, benign   . Gastritis   . GERD (gastroesophageal reflux disease)   . Hypercholesteremia   . Hypertension   . Prostate cancer ([place])   . Prostatism   . Reflux esophagitis   . Renal stones   . Skin cancer    left cheek/lesion excised  . Skin cancer   . Sleep apnea   . TIA (transient ischemic attack)   . TIA (transient ischemic attack)    Past Surgical History:  Procedure Laterality Date  . CARDIAC CATHETERIZATION    . COLONOSCOPY WITH PROPOFOL N/A 09/13/2015   Procedure: COLONOSCOPY WITH PROPOFOL;  Surgeon: Lollie Sails, MD;  Location: [facility] ENDOSCOPY;  Service: Endoscopy;  Laterality: N/A;  . CORONARY ANGIOPLASTY    . ESOPHAGOGASTRODUODENOSCOPY (EGD) WITH PROPOFOL N/A 09/13/2015   Procedure: ESOPHAGOGASTRODUODENOSCOPY (EGD) WITH PROPOFOL;  Surgeon: Lollie Sails, MD;  Location: [facility] ENDOSCOPY;  Service: Endoscopy;  Laterality: N/A;  . ESOPHAGOGASTRODUODENOSCOPY (EGD) WITH PROPOFOL N/A 12/28/2015   Procedure:  ESOPHAGOGASTRODUODENOSCOPY (EGD) WITH PROPOFOL;  Surgeon: Lollie Sails, MD;  Location: [facility] ENDOSCOPY;  Service: Endoscopy;  Laterality: N/A;  . ESOPHAGOGASTRODUODENOSCOPY (EGD) WITH PROPOFOL N/A 06/16/2016   Procedure: ESOPHAGOGASTRODUODENOSCOPY (EGD) WITH PROPOFOL;  Surgeon: Lollie Sails, MD;  Location: [facility] ENDOSCOPY;  Service: Endoscopy;  Laterality: N/A;  . EYE SURGERY  2013   CATARACT EXTRACTION  . GANGLION CYST EXCISION Right 05/18/2017   Procedure: REMOVAL GANGLION CYST ANKLE;  Surgeon: Samara Deist, DPM;  Location: [facility] ORS;  Service: Podiatry;  Laterality: Right;  . heart cath stent    . LEFT HEART CATH AND CORONARY ANGIOGRAPHY Right 12/03/2017   Procedure: Left Heart Cath and Coronary Angiography with possible coronary intervention;  Surgeon: Dionisio David, MD;  Location: [place] CV LAB;  Service: Cardiovascular;  Laterality: Right;  . PROSTATE BIOPSY    . stents     multiple   Family History  Problem Relation Age of Onset  . Cancer Mother        gastric and lung  . Cancer Father        multiple myeloma  . Stroke Father   . Cancer Sister        leukemia  . Cancer Brother        leukemia  . Cancer Brother        kidney  . Cancer  Daughter        Uterine  . Cancer Other        Nephes (Sister's Son): Prostate   Social History   Socioeconomic History  . Marital status: Married    Spouse name: Not on file  . Number of children: Not on file  . Years of education: Not on file  . Highest education level: Not on file  Occupational History  . Not on file  Social Needs  . Financial resource strain: Not hard at all  . Food insecurity:    Worry: Never true    Inability: Never true  . Transportation needs:    Medical: No    Non-medical: No  Tobacco Use  . Smoking status: Former Smoker    Packs/day: 1.00    Years: 35.00    Pack years: 35.00    Types: Cigarettes    Last attempt to quit: 03/06/1986    Years since quitting: 31.8  . Smokeless tobacco:  Former Systems developer    Types: Chew  Substance and Sexual Activity  . Alcohol use: No  . Drug use: No  . Sexual activity: Not Currently  Lifestyle  . Physical activity:    Days per week: Not on file    Minutes per session: Not on file  . Stress: Not on file  Relationships  . Social connections:    Talks on phone: Not on file    Gets together: Not on file    Attends religious service: Not on file    Active member of club or organization: Not on file    Attends meetings of clubs or organizations: Not on file    Relationship status: Not on file  Other Topics Concern  . Not on file  Social History Narrative  . Not on file    Outpatient Encounter Medications as of 01/01/2018  Medication Sig  . abiraterone acetate (ZYTIGA) 250 MG tablet Take 1 tablet (250 mg total) by mouth daily.  [name] aspirin EC 81 MG tablet Take 81 mg by mouth daily.  . calcium citrate-vitamin D (CITRACAL+D) 315-200 MG-UNIT tablet Take 1 tablet by mouth daily.  . cholecalciferol (VITAMIN D) 1000 units tablet Take 1,000 Units by mouth daily.   . clopidogrel (PLAVIX) 75 MG tablet Take 1 tablet (75 mg total) by mouth daily.  . fexofenadine (ALLEGRA) 180 MG tablet Take 180 mg by mouth daily as needed for allergies or rhinitis.  . fluticasone (FLONASE) 50 MCG/ACT nasal spray Place 2 sprays into both nostrils daily as needed for allergies.   . Ipratropium-Albuterol (COMBIVENT) 20-100 MCG/ACT AERS respimat 2 puffs qid prn (Patient taking differently: Inhale 2 puffs into the lungs every 6 (six) hours as needed for wheezing. )  . isosorbide mononitrate (IMDUR) 30 MG 24 hr tablet TAKE 1 TABLET BY MOUTH ONCE DAILY  . Leuprolide Acetate, 6 Month, (LUPRON) 45 MG injection Inject 45 mg into the muscle every 6 (six) months.  [name] losartan (COZAAR) 50 MG tablet Take 50 mg by mouth daily.  [name] lovastatin (MEVACOR) 40 MG tablet TAKE 2 TABLETS BY MOUTH AT BEDTIME  . Multiple Vitamin (MULTI-VITAMINS) TABS Take 1 tablet by mouth daily.   .  nitroGLYCERIN (NITROSTAT) 0.4 MG SL tablet Place 0.4 mg under the tongue every 5 (five) minutes as needed for chest pain.  . pantoprazole (PROTONIX) 40 MG tablet Take 40 mg by mouth 2 (two) times daily.   . ranitidine (ZANTAC) 150 MG tablet Take 150 mg by mouth daily.   [name]  amiodarone (PACERONE) 400 MG tablet Take 1 tablet (400 mg total) by mouth daily.  . [DISCONTINUED] hydrochlorothiazide (HYDRODIURIL) 12.5 MG tablet Take 1 tablet (12.5 mg total) by mouth daily. In AM.  . [DISCONTINUED] Polyethylene Glycol 3350 (MIRALAX PO) Take 1 packet by mouth daily as needed (constipation).    No facility-administered encounter medications on file as of 01/01/2018.     Review of Systems  Constitutional: Negative for appetite change and unexpected weight change.       Still with some fatigue.  Improved.    HENT: Negative for congestion and sinus pressure.   Respiratory: Negative for cough and chest tightness.        Breathing stable.    Cardiovascular: Negative for chest pain, palpitations and leg swelling.  Gastrointestinal: Negative for abdominal pain, diarrhea, nausea and vomiting.  Genitourinary: Negative for difficulty urinating and dysuria.  Musculoskeletal: Negative for joint swelling and myalgias.  Skin: Negative for color change and rash.  Neurological: Negative for dizziness, light-headedness and headaches.  Psychiatric/Behavioral: Negative for agitation and dysphoric mood.       Objective:    Physical Exam  Constitutional: He appears well-developed and well-nourished. No distress.  HENT:  Nose: Nose normal.  Mouth/Throat: Oropharynx is clear and moist.  Neck: Neck supple.  Cardiovascular: Normal rate and regular rhythm.  Pulmonary/Chest: Effort normal and breath sounds normal. No respiratory distress.  Abdominal: Soft. Bowel sounds are normal. There is no tenderness.  Musculoskeletal: He exhibits no edema or tenderness.  Lymphadenopathy:    He has no cervical adenopathy.  Skin:  No rash noted. No erythema.  Psychiatric: He has a normal mood and affect. His behavior is normal.    BP 122/70 (BP Location: Left Arm, Patient Position: Sitting, Cuff Size: Large)   Pulse 78   Temp 97.6 F (36.4 C) (Oral)   Resp 18   Wt 203 lb (92.1 kg)   SpO2 96%   BMI 29.55 kg/m  Wt Readings from Last 3 Encounters:  01/01/18 203 lb (92.1 kg)  12/10/17 206 lb (93.4 kg)  12/03/17 198 lb (89.8 kg)     Lab Results  Component Value Date   WBC 5.4 11/27/2017   HGB 13.7 11/27/2017   HCT 39.7 (L) 11/27/2017   PLT 182 11/27/2017   GLUCOSE 123 (H) 11/27/2017   CHOL 137 07/09/2017   TRIG 92.0 07/09/2017   HDL 33.30 (L) 07/09/2017   LDLCALC 85 07/09/2017   ALT 17 11/08/2017   AST 26 11/08/2017   NA 140 11/27/2017   K 3.5 11/28/2017   CL 111 11/27/2017   CREATININE 1.01 11/27/2017   BUN 19 11/27/2017   CO2 23 11/27/2017   TSH 2.864 11/26/2017   PSA 5.47 (H) 08/03/2016   HGBA1C 5.7 07/09/2017       Assessment & Plan:   Problem List Items Addressed This Visit    Atrial fibrillation, new onset ([place])    Admitted and recently diagnosed with afib.  On amiodarone.  Dose decreased recently.  Feeling better.  Seeing cardiology.  Adjusting medications.  Follow.        Barrett's esophagus    On protonix.  Followed by GI.  Upper symptoms controlled.  Change to pepcid.        CAD (coronary artery disease)    Recent cath - no significant CAD.  S/p previous stent placement.  Off eliquis.  Amiodarone dose decreased recently.  Continue f/u with cardiology.        CKD (chronic kidney disease)  Avoid antiinflammatories.  Follow metabolic panel.        COPD (chronic obstructive pulmonary disease) ([place])    Just evaluated by Dr Ashby Dawes.  Breathing stable.  cpap now.        Hypercholesterolemia    On lovastatin. Low cholesterol diet and exercise.  Follow lipid panel and liver function tests.        Relevant Orders   Hepatic function panel   Lipid panel   Hyperglycemia     Low carb diet and exercise.  Follow met b and a1c.        Hypertension, essential    Blood pressure under good control.  Continue same medication regimen.  Follow pressures.  Follow metabolic panel.        Relevant Orders   Basic metabolic panel   Malignant neoplasm of prostate ([place])    Receiving lupron injections.  Was questioning if some of his issues were related.  Plans to discuss with oncology.        Sleep apnea    Using cpap now.            Einar Pheasant, MD

## 2018-01-01 NOTE — Telephone Encounter (Signed)
Copied from [place] [id number]. Topic: Quick Communication - See Telephone Encounter >> Jan 01, 2018  2:23 PM Vernona Rieger wrote: CRM for notification. See Telephone encounter for: 01/01/18.  Patient's wife went to [facility] to get  " Pepcid AC " , and they saw one that was 165 mg. There was a regular one and a Pepcid complete. She wants to know which one does she need to get him?

## 2018-01-01 NOTE — Telephone Encounter (Signed)
If agreeable, we can send in rx for pepcid 20mg  q day #30 with 3 refills.

## 2018-01-02 ENCOUNTER — Other Ambulatory Visit: Payer: Self-pay

## 2018-01-02 MED ORDER — FAMOTIDINE 20 MG PO TABS: 20.0000 mg | ORAL_TABLET | Freq: Two times a day (BID) | ORAL | 3 refills | Status: DC

## 2018-01-02 NOTE — Telephone Encounter (Signed)
Wife is okay to send this in. Rx sent to pharmacy

## 2018-01-06 ENCOUNTER — Encounter: Payer: Self-pay | Admitting: Internal Medicine

## 2018-01-06 NOTE — Assessment & Plan Note (Signed)
Receiving lupron injections.  Was questioning if some of his issues were related.  Plans to discuss with oncology.

## 2018-01-06 NOTE — Assessment & Plan Note (Signed)
On lovastatin.  Low cholesterol diet and exercise.  Follow lipid panel and liver function tests.   

## 2018-01-06 NOTE — Assessment & Plan Note (Signed)
Low carb diet and exercise.  Follow met b and a1c.   

## 2018-01-06 NOTE — Assessment & Plan Note (Signed)
Admitted and recently diagnosed with afib.  On amiodarone.  Dose decreased recently.  Feeling better.  Seeing cardiology.  Adjusting medications.  Follow.

## 2018-01-06 NOTE — Assessment & Plan Note (Signed)
Just evaluated by Dr Ashby Dawes.  Breathing stable.  cpap now.

## 2018-01-06 NOTE — Assessment & Plan Note (Signed)
Avoid antiinflammatories.  Follow metabolic panel.  

## 2018-01-06 NOTE — Assessment & Plan Note (Signed)
On protonix.  Followed by GI.  Upper symptoms controlled.  Change to pepcid.

## 2018-01-06 NOTE — Assessment & Plan Note (Signed)
Using cpap now.

## 2018-01-06 NOTE — Assessment & Plan Note (Signed)
Recent cath - no significant CAD.  S/p previous stent placement.  Off eliquis.  Amiodarone dose decreased recently.  Continue f/u with cardiology.

## 2018-01-06 NOTE — Assessment & Plan Note (Signed)
Blood pressure under good control.  Continue same medication regimen.  Follow pressures.  Follow metabolic panel.   

## 2018-01-07 MED FILL — ABIRATERONE ACETATE 250 MG: 250 | 30 days supply | Qty: 30 | Fill #1

## 2018-01-10 ENCOUNTER — Inpatient Hospital Stay: Payer: Medicare HMO | Attending: Internal Medicine

## 2018-01-10 ENCOUNTER — Inpatient Hospital Stay: Payer: Medicare HMO | Admitting: Internal Medicine

## 2018-01-10 VITALS — BP 193/98 | HR 56 | Resp 16 | Wt 203.0 lb

## 2018-01-10 DIAGNOSIS — I480 Paroxysmal atrial fibrillation: Secondary | ICD-10-CM | POA: Insufficient documentation

## 2018-01-10 DIAGNOSIS — Z7901 Long term (current) use of anticoagulants: Secondary | ICD-10-CM | POA: Insufficient documentation

## 2018-01-10 DIAGNOSIS — R42 Dizziness and giddiness: Secondary | ICD-10-CM | POA: Diagnosis not present

## 2018-01-10 DIAGNOSIS — R0789 Other chest pain: Secondary | ICD-10-CM | POA: Diagnosis not present

## 2018-01-10 DIAGNOSIS — C61 Malignant neoplasm of prostate: Secondary | ICD-10-CM | POA: Insufficient documentation

## 2018-01-10 DIAGNOSIS — Z7982 Long term (current) use of aspirin: Secondary | ICD-10-CM | POA: Diagnosis not present

## 2018-01-10 DIAGNOSIS — Z79899 Other long term (current) drug therapy: Secondary | ICD-10-CM

## 2018-01-10 DIAGNOSIS — C7951 Secondary malignant neoplasm of bone: Secondary | ICD-10-CM | POA: Diagnosis not present

## 2018-01-10 LAB — COMPREHENSIVE METABOLIC PANEL
ALBUMIN: 3.6 g/dL (ref 3.5–5.0)
ALT: 15 U/L (ref 0–44)
ANION GAP: 5 (ref 5–15)
AST: 25 U/L (ref 15–41)
Alkaline Phosphatase: 53 U/L (ref 38–126)
BUN: 15 mg/dL (ref 8–23)
CO2: 23 mmol/L (ref 22–32)
Calcium: 8.9 mg/dL (ref 8.9–10.3)
Chloride: 111 mmol/L (ref 98–111)
Creatinine, Ser: 0.96 mg/dL (ref 0.61–1.24)
GFR calc Af Amer: >60 mL/min (ref >60)
GFR calc non Af Amer: >60 mL/min (ref >60)
GLUCOSE: 121 mg/dL — AB (ref 70–99)
Potassium: 3.2 mmol/L — ABNORMAL LOW (ref 3.5–5.1)
SODIUM: 139 mmol/L (ref 135–145)
TOTAL PROTEIN: 6.6 g/dL (ref 6.5–8.1)
Total Bilirubin: 0.8 mg/dL (ref 0.3–1.2)

## 2018-01-10 LAB — CBC WITH DIFFERENTIAL/PLATELET
ABS IMMATURE GRANULOCYTES: 0.01 10*3/uL (ref 0.00–0.07)
BASOS PCT: 1 %
Basophils Absolute: 0 10*3/uL (ref 0.0–0.1)
Eosinophils Absolute: 0.3 10*3/uL (ref 0.0–0.5)
Eosinophils Relative: 5 %
HCT: 33.7 % — ABNORMAL LOW (ref 39.0–52.0)
Hemoglobin: 11.3 g/dL — ABNORMAL LOW (ref 13.0–17.0)
IMMATURE GRANULOCYTES: 0 %
LYMPHS ABS: 1.2 10*3/uL (ref 0.7–4.0)
Lymphocytes Relative: 23 %
MCH: 29.1 pg (ref 26.0–34.0)
MCHC: 33.5 g/dL (ref 30.0–36.0)
MCV: 86.9 fL (ref 80.0–100.0)
MONOS PCT: 13 %
Monocytes Absolute: 0.7 10*3/uL (ref 0.1–1.0)
NEUTROS PCT: 58 %
Neutro Abs: 3.1 10*3/uL (ref 1.7–7.7)
PLATELETS: 174 10*3/uL (ref 150–400)
RBC: 3.88 MIL/uL — ABNORMAL LOW (ref 4.22–5.81)
RDW: 13.7 % (ref 11.5–15.5)
WBC: 5.3 10*3/uL (ref 4.0–10.5)
nRBC: 0 % (ref 0.0–0.2)

## 2018-01-10 LAB — PSA: Prostatic Specific Antigen: 0.01 ng/mL (ref 0.00–4.00)

## 2018-01-10 NOTE — Progress Notes (Signed)
[place] OFFICE PROGRESS NOTE  Patient Care Team: Einar Pheasant, MD as PCP - General (Internal Medicine)  Cancer Staging No matching staging information was found for the patient.   Oncology History   Nov 2017- completed IM RT radiation therapy to his prostate and pelvic nodes for Gleason 7 (4+3) adenocarcinoma the prostate presenting the PSA of 7.8.  # JAN 2019- METASTATIC PROSTATE CA to Bone [low volume met on bone scan; CT- NED]; PSA ~50; March 25th 2019- Lupron 45 mg IM [Urology q 21M];  # May 23rd Zytiga 290m/day; no prednisone   # OBenita Stabile [place]]; Oct 2019- A.fib/not on eliquis; Dr.Khan/[facility].   -------------------------------------------------------------------------  DIAGNOSIS: [ JAN 2019]- Met- PROSTATE CANCER  STAGE:  IV       ;GOALS: PALLIATIVE  CURRENT/MOST RECENT THERAPY [ march 2019]- Lupron; May 23rd 2019- Zytiga 2564mday      [place]     INTERVAL HISTORY:  JaDENZAL MEIR30.o.  male pleasant patient above history of metastatic prostate cancer on ADT/Zytiga is here for follow-up.  In the interim patient was diagnosed with A. Fib; admit to the hospital.  He also had a cardiac cath.  Patient had a recent fall hit his left side of the body.  Patient complains of mild pain of his right rib cage.  Denies any trauma there.  No skin rash.  He is concerned if Lupron is causing his A. fib.  Intermittent hot flashes.  Review of Systems  Constitutional: Negative for chills, diaphoresis, fever, malaise/fatigue and weight loss.  HENT: Negative for nosebleeds and sore throat.   Eyes: Negative for double vision.  Respiratory: Negative for cough, hemoptysis, sputum production, shortness of breath and wheezing.   Cardiovascular: Negative for chest pain, palpitations, orthopnea and leg swelling.  Gastrointestinal: Negative for abdominal pain, blood in stool, constipation, diarrhea, heartburn, melena,  nausea and vomiting.  Genitourinary: Negative for dysuria, frequency and urgency.  Musculoskeletal: Negative for back pain and joint pain.  Skin: Negative.  Negative for itching and rash.  Neurological: Positive for dizziness. Negative for tingling, focal weakness, weakness and headaches.  Endo/Heme/Allergies: Does not bruise/bleed easily.  Psychiatric/Behavioral: Negative for depression. The patient is not nervous/anxious and does not have insomnia.       PAST MEDICAL HISTORY :  Past Medical History:  Diagnosis Date  . Allergic rhinitis   . Barrett's esophagus 10/21/13  . Bone cancer ([facility]  . Chronic kidney disease    stones  . Colon polyp, hyperplastic   . COPD (chronic obstructive pulmonary disease) (HCC)    mild COPD. former smoker  . Coronary artery disease   . Diverticulosis   . Fundic gland polyps of stomach, benign   . Gastritis   . GERD (gastroesophageal reflux disease)   . Hypercholesteremia   . Hypertension   . Prostate cancer ([facility]  . Prostatism   . Reflux esophagitis   . Renal stones   . Skin cancer    left cheek/lesion excised  . Skin cancer   . Sleep apnea   . TIA (transient ischemic attack)   . TIA (transient ischemic attack)     PAST SURGICAL HISTORY :   Past Surgical History:  Procedure Laterality Date  . CARDIAC CATHETERIZATION    . COLONOSCOPY WITH PROPOFOL N/A 09/13/2015   Procedure: COLONOSCOPY WITH PROPOFOL;  Surgeon: MaLollie SailsMD;  [facility];  Service: Endoscopy;  Laterality: N/A;  . CORONARY ANGIOPLASTY    .  ESOPHAGOGASTRODUODENOSCOPY (EGD) WITH PROPOFOL N/A 09/13/2015   Procedure: ESOPHAGOGASTRODUODENOSCOPY (EGD) WITH PROPOFOL;  Surgeon: Lollie Sails, MD;  Location: [facility] ENDOSCOPY;  Service: Endoscopy;  Laterality: N/A;  . ESOPHAGOGASTRODUODENOSCOPY (EGD) WITH PROPOFOL N/A 12/28/2015   Procedure: ESOPHAGOGASTRODUODENOSCOPY (EGD) WITH PROPOFOL;  Surgeon: Lollie Sails, MD;  Location: [facility] ENDOSCOPY;  Service:  Endoscopy;  Laterality: N/A;  . ESOPHAGOGASTRODUODENOSCOPY (EGD) WITH PROPOFOL N/A 06/16/2016   Procedure: ESOPHAGOGASTRODUODENOSCOPY (EGD) WITH PROPOFOL;  Surgeon: Lollie Sails, MD;  Location: [facility] ENDOSCOPY;  Service: Endoscopy;  Laterality: N/A;  . EYE SURGERY  2013   CATARACT EXTRACTION  . GANGLION CYST EXCISION Right 05/18/2017   Procedure: REMOVAL GANGLION CYST ANKLE;  Surgeon: Samara Deist, DPM;  Location: [facility] ORS;  Service: Podiatry;  Laterality: Right;  . heart cath stent    . LEFT HEART CATH AND CORONARY ANGIOGRAPHY Right 12/03/2017   Procedure: Left Heart Cath and Coronary Angiography with possible coronary intervention;  Surgeon: Dionisio David, MD;  Location: [place] CV LAB;  Service: Cardiovascular;  Laterality: Right;  . PROSTATE BIOPSY    . stents     multiple    FAMILY HISTORY :   Family History  Problem Relation Age of Onset  . Cancer Mother        gastric and lung  . Cancer Father        multiple myeloma  . Stroke Father   . Cancer Sister        leukemia  . Cancer Brother        leukemia  . Cancer Brother        kidney  . Cancer Daughter        Uterine  . Cancer Other        [place] (Sister's Son): Prostate    SOCIAL HISTORY:   Social History   Tobacco Use  . Smoking status: Former Smoker    Packs/day: 1.00    Years: 35.00    Pack years: 35.00    Types: Cigarettes    Last attempt to quit: 03/06/1986    Years since quitting: 31.8  . Smokeless tobacco: Former Systems developer    Types: Chew  Substance Use Topics  . Alcohol use: No  . Drug use: No    ALLERGIES:  is allergic to atorvastatin.  MEDICATIONS:  Current Outpatient Medications  Medication Sig Dispense Refill  . abiraterone acetate (ZYTIGA) 250 MG tablet Take 1 tablet (250 mg total) by mouth daily. 30 tablet 4  . amiodarone (PACERONE) 400 MG tablet Take 1 tablet (400 mg total) by mouth daily. 30 tablet 0  . aspirin EC 81 MG tablet Take 81 mg by mouth daily.    . calcium citrate-vitamin  D (CITRACAL+D) 315-200 MG-UNIT tablet Take 1 tablet by mouth daily.    . cholecalciferol (VITAMIN D) 1000 units tablet Take 1,000 Units by mouth daily.     . clopidogrel (PLAVIX) 75 MG tablet Take 1 tablet (75 mg total) by mouth daily. 90 tablet 0  . famotidine (PEPCID) 20 MG tablet Take 1 tablet (20 mg total) by mouth 2 (two) times daily. 30 tablet 3  . fexofenadine (ALLEGRA) 180 MG tablet Take 180 mg by mouth daily as needed for allergies or rhinitis.    . fluticasone (FLONASE) 50 MCG/ACT nasal spray Place 2 sprays into both nostrils daily as needed for allergies.     . Ipratropium-Albuterol (COMBIVENT) 20-100 MCG/ACT AERS respimat 2 puffs qid prn (Patient taking differently: Inhale 2 puffs into the lungs every 6 (  six) hours as needed for wheezing. ) 1 Inhaler 3  . isosorbide mononitrate (IMDUR) 30 MG 24 hr tablet TAKE 1 TABLET BY MOUTH ONCE DAILY 90 tablet 2  . Leuprolide Acetate, 6 Month, (LUPRON) 45 MG injection Inject 45 mg into the muscle every 6 (six) months.    [name] losartan (COZAAR) 50 MG tablet Take 50 mg by mouth daily.    [name] lovastatin (MEVACOR) 40 MG tablet TAKE 2 TABLETS BY MOUTH AT BEDTIME 180 tablet 1  . Multiple Vitamin (MULTI-VITAMINS) TABS Take 1 tablet by mouth daily.     . nitroGLYCERIN (NITROSTAT) 0.4 MG SL tablet Place 0.4 mg under the tongue every 5 (five) minutes as needed for chest pain.    . pantoprazole (PROTONIX) 40 MG tablet Take 40 mg by mouth 2 (two) times daily.     . ranitidine (ZANTAC) 150 MG tablet Take 150 mg by mouth daily.      No current facility-administered medications for this visit.     PHYSICAL EXAMINATION: ECOG PERFORMANCE STATUS: 0 - Asymptomatic  BP (!) 193/98 Comment: Re-check  Pulse (!) 56   Resp 16   Wt 203 lb (92.1 kg)   BMI 29.55 kg/m   Filed Weights   01/10/18 1145  Weight: 203 lb (92.1 kg)    Physical Exam  Constitutional: He is oriented to person, place, and time and well-developed, well-nourished, and in no distress.  He is  accompanied by his wife.  Is walking himself.  HENT:  Head: Normocephalic and atraumatic.  Mouth/Throat: Oropharynx is clear and moist. No oropharyngeal exudate.  Eyes: Pupils are equal, round, and reactive to light.  Neck: Normal range of motion. Neck supple.  Cardiovascular: Normal rate and regular rhythm.  Pulmonary/Chest: No respiratory distress. He has no wheezes.  Abdominal: Soft. Bowel sounds are normal. He exhibits no distension and no mass. There is no tenderness. There is no rebound and no guarding.  Musculoskeletal: Normal range of motion. He exhibits no edema or tenderness.  Neurological: He is alert and oriented to person, place, and time.  Skin: Skin is warm.  Psychiatric: Affect normal.      LABORATORY DATA:  I have reviewed the data as listed    Component Value Date/Time   NA 139 01/10/2018 1040   NA 140 09/16/2013 0438   K 3.2 (L) 01/10/2018 1040   K 4.2 09/16/2013 0438   CL 111 01/10/2018 1040   CL 109 (H) 09/16/2013 0438   CO2 23 01/10/2018 1040   CO2 24 09/16/2013 0438   GLUCOSE 121 (H) 01/10/2018 1040   GLUCOSE 101 (H) 09/16/2013 0438   BUN 15 01/10/2018 1040   BUN 20 (H) 09/16/2013 0438   CREATININE 0.96 01/10/2018 1040   CREATININE 1.28 09/16/2013 0438   CALCIUM 8.9 01/10/2018 1040   CALCIUM 8.3 (L) 09/16/2013 0438   PROT 6.6 01/10/2018 1040   PROT 6.7 09/15/2013 0755   ALBUMIN 3.6 01/10/2018 1040   ALBUMIN 3.5 09/15/2013 0755   AST 25 01/10/2018 1040   AST 37 09/15/2013 0755   ALT 15 01/10/2018 1040   ALT 28 09/15/2013 0755   ALKPHOS 53 01/10/2018 1040   ALKPHOS 44 (L) 09/15/2013 0755   BILITOT 0.8 01/10/2018 1040   BILITOT 0.5 09/15/2013 0755   GFRNONAA >60 01/10/2018 1040   GFRNONAA 54 (L) 09/16/2013 0438   GFRAA >60 01/10/2018 1040   GFRAA >60 09/16/2013 0438    No results found for: SPEP, UPEP  Lab Results  Component Value Date  WBC 5.3 01/10/2018   NEUTROABS 3.1 01/10/2018   HGB 11.3 (L) 01/10/2018   HCT 33.7 (L) 01/10/2018    MCV 86.9 01/10/2018   PLT 174 01/10/2018      Chemistry      Component Value Date/Time   NA 139 01/10/2018 1040   NA 140 09/16/2013 0438   K 3.2 (L) 01/10/2018 1040   K 4.2 09/16/2013 0438   CL 111 01/10/2018 1040   CL 109 (H) 09/16/2013 0438   CO2 23 01/10/2018 1040   CO2 24 09/16/2013 0438   BUN 15 01/10/2018 1040   BUN 20 (H) 09/16/2013 0438   CREATININE 0.96 01/10/2018 1040   CREATININE 1.28 09/16/2013 0438      Component Value Date/Time   CALCIUM 8.9 01/10/2018 1040   CALCIUM 8.3 (L) 09/16/2013 0438   ALKPHOS 53 01/10/2018 1040   ALKPHOS 44 (L) 09/15/2013 0755   AST 25 01/10/2018 1040   AST 37 09/15/2013 0755   ALT 15 01/10/2018 1040   ALT 28 09/15/2013 0755   BILITOT 0.8 01/10/2018 1040   BILITOT 0.5 09/15/2013 0755       RADIOGRAPHIC STUDIES: I have personally reviewed the radiological images as listed and agreed with the findings in the report. No results found.   ASSESSMENT & PLAN:  Malignant neoplasm of prostate ([place]) # Castrate sensitive-metastatic prostate cancer to the bone. Stage IV-on ADT/Zytiga low-dose. Lupron every 6 months [cancer center]. PSA -improving.  Stable.  No clinical evidence of progression.   #Continue Zytiga 250 mg once a day with food.  #Labile blood pressures-recommend checking blood pressures at home.  Recommend taking a log of his blood pressures to his PCP/cardiologist.  #Right chest wall pain question musculoskeletal rather than metastatic disease.  Discussed regarding a bone scan.  Patient wants to hold off until next visit in 2 months  #A. fib paroxysmal-currently in sinus rhythm.  Not on anticoagulation.  Aspirin Plavix.  #With regards to dizzy spells-I do not think is related to Lupron.  Given the temporal relation discussed that I could cut down the dose to 22.5 every 3 months.  Patient wants to keep the current regimen for now   # follow up in 2 months/ labs-cbc/cmp/PSA; will decide on Bone scan at that visit.    Cc;  Dr.Scott.    No orders of the defined types were placed in this encounter.  All questions were answered. The patient knows to call the clinic with any problems, questions or concerns.      Cammie Sickle, MD 01/10/2018 6:47 PM

## 2018-01-10 NOTE — Assessment & Plan Note (Addendum)
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 Lupron every 6 months [cancer center]. PSA -improving.  Stable.  No clinical evidence of progression.   #Continue Zytiga 250 mg once a day with food.  #Labile blood pressures-recommend checking blood pressures at home.  Recommend taking a log of his blood pressures to his PCP/cardiologist.  #Right chest wall pain question musculoskeletal rather than metastatic disease.  Discussed regarding a bone scan.  Patient wants to hold off until next visit in 2 months  #A. fib paroxysmal-currently in sinus rhythm.  Not on anticoagulation.  Aspirin Plavix.  #With regards to dizzy spells-I do not think is related to Lupron.  Given the temporal relation discussed that I could cut down the dose to 22.5 every 3 months.  Patient wants to keep the current regimen for now   # follow up in 2 months/ labs-cbc/cmp/PSA; will decide on Bone scan at that visit.    Cc; Dr.Scott.

## 2018-01-13 DIAGNOSIS — G4733 Obstructive sleep apnea (adult) (pediatric): Secondary | ICD-10-CM | POA: Diagnosis not present

## 2018-01-28 ENCOUNTER — Telehealth: Payer: Self-pay | Admitting: Internal Medicine

## 2018-01-28 NOTE — Telephone Encounter (Signed)
This medication was not last filled by Dr. Nicki Reaper. Please advise?

## 2018-01-28 NOTE — Telephone Encounter (Signed)
Called [facility] to let them know to send this medication to Dr. Chancy Milroy, his cardiologist.

## 2018-01-28 NOTE — Telephone Encounter (Signed)
Copied from [place] [id number]. Topic: Quick Communication - Rx Refill/Question >> Jan 28, 2018  8:23 AM Celedonio Savage L wrote: Medication: amiodarone (PACERONE) 400 MG tablet (Expired)   hospital wrote pt this RX  Has the patient contacted their pharmacy? Yes.  Friday pharmacy had sent over a request (Agent: If no, request that the patient contact the pharmacy for the refill.) (Agent: If yes, when and what did the pharmacy advise?) pharmacy haven't heard anything from Dr Nicki Reaper  Preferred Pharmacy (with phone number or street name):   [facility], [place]. [phone] (Phone) [phone] (Fax)    Agent: Please be advised that RX refills may take up to 3 business days. We ask that you follow-up with your pharmacy.

## 2018-01-29 ENCOUNTER — Telehealth: Payer: Self-pay | Admitting: Family Medicine

## 2018-01-29 ENCOUNTER — Encounter: Payer: Self-pay | Admitting: Family Medicine

## 2018-01-29 ENCOUNTER — Ambulatory Visit (INDEPENDENT_AMBULATORY_CARE_PROVIDER_SITE_OTHER): Payer: Medicare HMO | Admitting: Family Medicine

## 2018-01-29 VITALS — BP 142/82 | HR 59 | Temp 97.8°F | Ht 69.0 in | Wt 206.8 lb

## 2018-01-29 DIAGNOSIS — R0982 Postnasal drip: Secondary | ICD-10-CM | POA: Diagnosis not present

## 2018-01-29 DIAGNOSIS — J00 Acute nasopharyngitis [common cold]: Secondary | ICD-10-CM

## 2018-01-29 DIAGNOSIS — R059 Cough, unspecified: Secondary | ICD-10-CM

## 2018-01-29 DIAGNOSIS — R05 Cough: Secondary | ICD-10-CM

## 2018-01-29 MED ORDER — BENZONATATE 100 MG PO CAPS: 100.0000 mg | ORAL_CAPSULE | Freq: Three times a day (TID) | ORAL | 0 refills | Status: DC | PRN

## 2018-01-29 NOTE — Progress Notes (Signed)
Subjective:    Patient ID: Nathaniel Thompson, male    DOB: January 06, 1936, 82 y.o.   MRN: [id number]  HPI   Patient presents to clinic complaining of sinus congestion, clear nasal drainage and cough for 3 days.  Patient states he started taking generic Allegra and has been doing salt water gargles for the past 2 days which seemed to minimally help symptoms.  Patient states he can feel drainage going down the back of throat causing him to cough.  Denies fever or chills.  Denies shortness of breath or wheezing.  Denies nausea, vomiting or diarrhea.  Patient Active Problem List   Diagnosis Date Noted  . Atrial fibrillation, new onset ([place]) 11/27/2017  . Dizziness 08/12/2017  . Nasal erythema 04/10/2017  . Carotid artery disease ([place]) 08/06/2016  . History of TIA (transient ischemic attack) 07/24/2016  . Sleep apnea 07/24/2016  . COPD (chronic obstructive pulmonary disease) ([place]) 07/24/2016  . CKD (chronic kidney disease) 07/24/2016  . Barrett's esophagus 07/24/2016  . Hyperglycemia 07/24/2016  . Hypercholesterolemia 07/12/2016  . Hypertension, essential 07/12/2016  . CAD (coronary artery disease) 07/12/2016  . Malignant neoplasm of prostate ([place]) 11/15/2015  . Family history of cancer 11/15/2015   Social History   Tobacco Use  . Smoking status: Former Smoker    Packs/day: 1.00    Years: 35.00    Pack years: 35.00    Types: Cigarettes    Last attempt to quit: 03/06/1986    Years since quitting: 31.9  . Smokeless tobacco: Former Systems developer    Types: Chew  Substance Use Topics  . Alcohol use: No   Review of Systems   Constitutional: Negative for chills, fatigue and fever.  HENT: +sinus congestion and post nasal drip Eyes: Negative.   Respiratory: +dry cough. Negative for shortness of breath and wheezing.   Cardiovascular: Negative for chest pain, palpitations and leg swelling.  Gastrointestinal: Negative for abdominal pain, diarrhea, nausea and vomiting.  Genitourinary: Negative for  dysuria, frequency and urgency.  Musculoskeletal: Negative for arthralgias and myalgias.  Skin: Negative for color change, pallor and rash.  Neurological: Negative for syncope, light-headedness and headaches.  Psychiatric/Behavioral: The patient is not nervous/anxious.       Objective:   Physical Exam  Constitutional: He is oriented to person, place, and time.  HENT:  Head: Normocephalic and atraumatic.  Right Ear: Tympanic membrane and ear canal normal.  Left Ear: Tympanic membrane and ear canal normal.  Nose: Mucosal edema and rhinorrhea present.  Clear nasal discharge and post nasal drip  Eyes: Pupils are equal, round, and reactive to light. Conjunctivae are normal. No scleral icterus.  Neck: Neck supple. No tracheal deviation present.  Cardiovascular: Normal rate and regular rhythm.  Pulmonary/Chest: Effort normal and breath sounds normal. No respiratory distress. He has no wheezes. He has no rales.  Musculoskeletal: He exhibits no edema.  Gait normal  Lymphadenopathy:    He has no cervical adenopathy.  Neurological: He is alert and oriented to person, place, and time.  Skin: Skin is warm and dry. No pallor.  Psychiatric: He has a normal mood and affect. His behavior is normal.  Nursing note and vitals reviewed.     Vitals:   01/29/18 1107  BP: (!) 142/82  Pulse: (!) 59  Temp: 97.8 F (36.6 C)  SpO2: 98%   Assessment & Plan:   Common cold, cough, post nasal drip -- Patient will continue allegra and salt water gargles as he has been doing. Also advised he  can do a saline nasal spray or Flonase spray - he has this at home and does not need a new Rx. Patient will use tessalon perles as needed for cough.  Advised to rest, increase fluids, do good handwashing and his symptoms should resolve over the next 7-10 days.   Keep regular follow up as planned with PCP. Return to clinic sooner if issues arise.

## 2018-01-29 NOTE — Telephone Encounter (Signed)
Pt has picked up amiodarone

## 2018-01-29 NOTE — Telephone Encounter (Signed)
Patient states he will take his last amiodarone pill tomorrow and still has not heard back about his refill - looks like Dr Humphrey Rolls fills this  Can we see if they got refill request?

## 2018-01-29 NOTE — Telephone Encounter (Signed)
Confirmed with pharmacy that pt has picked up rx for amiodarone

## 2018-01-29 NOTE — Telephone Encounter (Signed)
Great thank

## 2018-02-04 ENCOUNTER — Encounter: Payer: Self-pay | Admitting: Family Medicine

## 2018-02-04 ENCOUNTER — Ambulatory Visit (INDEPENDENT_AMBULATORY_CARE_PROVIDER_SITE_OTHER): Payer: Medicare HMO | Admitting: Family Medicine

## 2018-02-04 VITALS — BP 138/82 | HR 71 | Temp 98.2°F | Ht 69.0 in | Wt 206.4 lb

## 2018-02-04 DIAGNOSIS — R05 Cough: Secondary | ICD-10-CM | POA: Diagnosis not present

## 2018-02-04 DIAGNOSIS — R0982 Postnasal drip: Secondary | ICD-10-CM | POA: Diagnosis not present

## 2018-02-04 DIAGNOSIS — J019 Acute sinusitis, unspecified: Secondary | ICD-10-CM | POA: Diagnosis not present

## 2018-02-04 DIAGNOSIS — R059 Cough, unspecified: Secondary | ICD-10-CM

## 2018-02-04 MED ORDER — AMOXICILLIN-POT CLAVULANATE 875-125 MG PO TABS: 1.0000 | ORAL_TABLET | Freq: Two times a day (BID) | ORAL | 0 refills | Status: DC

## 2018-02-04 MED FILL — ABIRATERONE ACETATE 250 MG: 250 | 30 days supply | Qty: 30 | Fill #2

## 2018-02-04 NOTE — Progress Notes (Signed)
Subjective:    Patient ID: Nathaniel Thompson, male    DOB: 04-02-1935, 82 y.o.   MRN: [id number]  HPI  Presents to clinic c/o continued sinus congestion and coughing. Was here on 01/29/18 and diagnosed with common cold (symptoms had been present 3 days as of 01/29/18) - congestion was clear from nose and cough was mainly dry at that time. He was advised to continue daily allegra at that time, rest, increase fluids.  Patient states the congestion and pressure in his sinuses has worsened, he is blowing thick yellow mucus from nose, and feels thick drainage down the back of throat.  States he is also coughing up some yellow phlegm.  Denies any wheezing or shortness of breath.  Denies fever or chills.   Patient Active Problem List   Diagnosis Date Noted  . Atrial fibrillation, new onset ([place]) 11/27/2017  . Dizziness 08/12/2017  . Nasal erythema 04/10/2017  . Carotid artery disease ([place]) 08/06/2016  . History of TIA (transient ischemic attack) 07/24/2016  . Sleep apnea 07/24/2016  . COPD (chronic obstructive pulmonary disease) ([place]) 07/24/2016  . CKD (chronic kidney disease) 07/24/2016  . Barrett's esophagus 07/24/2016  . Hyperglycemia 07/24/2016  . Hypercholesterolemia 07/12/2016  . Hypertension, essential 07/12/2016  . CAD (coronary artery disease) 07/12/2016  . Malignant neoplasm of prostate ([place]) 11/15/2015  . Family history of cancer 11/15/2015   Social History   Tobacco Use  . Smoking status: Former Smoker    Packs/day: 1.00    Years: 35.00    Pack years: 35.00    Types: Cigarettes    Last attempt to quit: 03/06/1986    Years since quitting: 31.9  . Smokeless tobacco: Former Systems developer    Types: Chew  Substance Use Topics  . Alcohol use: No   Review of Systems   Constitutional: Negative for chills, fatigue and fever.  HENT: +congestion, sinus pain, sinus drainage Eyes: Negative.   Respiratory: +cough. Negative shortness of breath and wheezing.   Cardiovascular: Negative for  chest pain, palpitations and leg swelling.  Gastrointestinal: Negative for abdominal pain, diarrhea, nausea and vomiting.  Genitourinary: Negative for dysuria, frequency and urgency.  Musculoskeletal: Negative for arthralgias and myalgias.  Skin: Negative for color change, pallor and rash.  Neurological: Negative for syncope, light-headedness and headaches.  Psychiatric/Behavioral: The patient is not nervous/anxious.       Objective:   Physical Exam  Constitutional: He is oriented to person, place, and time. He appears well-nourished. No distress.  HENT:  Head: Normocephalic and atraumatic.  Nose: Mucosal edema and rhinorrhea present. Right sinus exhibits maxillary sinus tenderness and frontal sinus tenderness. Left sinus exhibits maxillary sinus tenderness and frontal sinus tenderness.  +fullness bilat TMs. +thick yellow nasal drainage/post nasal drip.   Eyes: Conjunctivae and EOM are normal. No scleral icterus.  Neck: Neck supple. No tracheal deviation present.  Cardiovascular: Normal rate and regular rhythm.  Pulmonary/Chest: Effort normal. No respiratory distress. He has no wheezes. He has no rales.  Musculoskeletal: He exhibits no edema.  Gait normal.   Neurological: He is alert and oriented to person, place, and time.  Skin: Skin is warm and dry. Capillary refill takes less than 2 seconds. He is not diaphoretic. No pallor.  Psychiatric: He has a normal mood and affect. His behavior is normal.  Nursing note and vitals reviewed.     Vitals:   02/04/18 1124 02/04/18 1150  BP: (!) 170/90 138/82  Pulse: 71   Temp: 98.2 F (36.8 C)  SpO2: 96%     Assessment & Plan:    Sinusitis - patient will take Augmentin twice daily for 10 days.  Advised to eat a yogurt daily or take probiotic with this medication.  Also advised to continue keeping up good fluid intake, get good rest and do good handwashing.  Cough/postnasal drip - I suspect cough is directly related to postnasal drip.   Lungs are clear on exam.  Patient will continue Allegra daily and advised to try taking Mucinex to help suppress cough and also thin secretions.  Keep regularly scheduled follow-up with PCP as planned.  Return to clinic sooner if any issues arise.

## 2018-02-04 NOTE — Patient Instructions (Signed)
You can take mucinex 2x per day to help cough - this works to suppress cough and helps thin secretions  Continue to take allegra daily as well

## 2018-02-12 DIAGNOSIS — G4733 Obstructive sleep apnea (adult) (pediatric): Secondary | ICD-10-CM | POA: Diagnosis not present

## 2018-03-07 MED FILL — ABIRATERONE ACETATE 250 MG: 250 | 30 days supply | Qty: 30 | Fill #3

## 2018-03-12 ENCOUNTER — Other Ambulatory Visit: Payer: Self-pay | Admitting: *Deleted

## 2018-03-12 DIAGNOSIS — C61 Malignant neoplasm of prostate: Secondary | ICD-10-CM

## 2018-03-13 ENCOUNTER — Inpatient Hospital Stay: Payer: Medicare HMO | Attending: Internal Medicine

## 2018-03-13 ENCOUNTER — Inpatient Hospital Stay: Payer: Medicare HMO | Admitting: Internal Medicine

## 2018-03-13 ENCOUNTER — Encounter: Payer: Self-pay | Admitting: Internal Medicine

## 2018-03-13 ENCOUNTER — Other Ambulatory Visit: Payer: Self-pay

## 2018-03-13 VITALS — BP 170/89 | HR 55 | Temp 97.8°F | Resp 20

## 2018-03-13 DIAGNOSIS — Z79899 Other long term (current) drug therapy: Secondary | ICD-10-CM | POA: Diagnosis not present

## 2018-03-13 DIAGNOSIS — Z7902 Long term (current) use of antithrombotics/antiplatelets: Secondary | ICD-10-CM | POA: Diagnosis not present

## 2018-03-13 DIAGNOSIS — Z87891 Personal history of nicotine dependence: Secondary | ICD-10-CM

## 2018-03-13 DIAGNOSIS — Z8673 Personal history of transient ischemic attack (TIA), and cerebral infarction without residual deficits: Secondary | ICD-10-CM | POA: Insufficient documentation

## 2018-03-13 DIAGNOSIS — C61 Malignant neoplasm of prostate: Secondary | ICD-10-CM

## 2018-03-13 DIAGNOSIS — I480 Paroxysmal atrial fibrillation: Secondary | ICD-10-CM

## 2018-03-13 DIAGNOSIS — Z7982 Long term (current) use of aspirin: Secondary | ICD-10-CM | POA: Diagnosis not present

## 2018-03-13 DIAGNOSIS — C7951 Secondary malignant neoplasm of bone: Secondary | ICD-10-CM | POA: Insufficient documentation

## 2018-03-13 DIAGNOSIS — I10 Essential (primary) hypertension: Secondary | ICD-10-CM | POA: Diagnosis not present

## 2018-03-13 DIAGNOSIS — R42 Dizziness and giddiness: Secondary | ICD-10-CM | POA: Diagnosis not present

## 2018-03-13 LAB — COMPREHENSIVE METABOLIC PANEL
ALBUMIN: 3.8 g/dL (ref 3.5–5.0)
ALT: 17 U/L (ref 0–44)
AST: 26 U/L (ref 15–41)
Alkaline Phosphatase: 63 U/L (ref 38–126)
Anion gap: 6 (ref 5–15)
BILIRUBIN TOTAL: 0.9 mg/dL (ref 0.3–1.2)
BUN: 16 mg/dL (ref 8–23)
CO2: 27 mmol/L (ref 22–32)
CREATININE: 1.22 mg/dL (ref 0.61–1.24)
Calcium: 9.1 mg/dL (ref 8.9–10.3)
Chloride: 108 mmol/L (ref 98–111)
GFR calc Af Amer: >60 mL/min (ref >60)
GFR, EST NON AFRICAN AMERICAN: 56 mL/min — AB (ref >60)
GLUCOSE: 82 mg/dL (ref 70–99)
Potassium: 3.5 mmol/L (ref 3.5–5.1)
Sodium: 141 mmol/L (ref 135–145)
TOTAL PROTEIN: 6.7 g/dL (ref 6.5–8.1)

## 2018-03-13 LAB — CBC WITH DIFFERENTIAL/PLATELET
ABS IMMATURE GRANULOCYTES: 0.01 10*3/uL (ref 0.00–0.07)
BASOS PCT: 1 %
Basophils Absolute: 0 10*3/uL (ref 0.0–0.1)
Eosinophils Absolute: 0.4 10*3/uL (ref 0.0–0.5)
Eosinophils Relative: 7 %
HCT: 37.4 % — ABNORMAL LOW (ref 39.0–52.0)
HEMOGLOBIN: 12.2 g/dL — AB (ref 13.0–17.0)
IMMATURE GRANULOCYTES: 0 %
LYMPHS PCT: 29 %
Lymphs Abs: 1.6 10*3/uL (ref 0.7–4.0)
MCH: 28.2 pg (ref 26.0–34.0)
MCHC: 32.6 g/dL (ref 30.0–36.0)
MCV: 86.4 fL (ref 80.0–100.0)
MONOS PCT: 13 %
Monocytes Absolute: 0.7 10*3/uL (ref 0.1–1.0)
NEUTROS ABS: 2.8 10*3/uL (ref 1.7–7.7)
NEUTROS PCT: 50 %
PLATELETS: 196 10*3/uL (ref 150–400)
RBC: 4.33 MIL/uL (ref 4.22–5.81)
RDW: 13.6 % (ref 11.5–15.5)
WBC: 5.5 10*3/uL (ref 4.0–10.5)
nRBC: 0 % (ref 0.0–0.2)

## 2018-03-13 LAB — PSA: PROSTATIC SPECIFIC ANTIGEN: 0.02 ng/mL (ref 0.00–4.00)

## 2018-03-13 NOTE — Progress Notes (Signed)
[place] OFFICE PROGRESS NOTE  Patient Care Team: Einar Pheasant, MD as PCP - General (Internal Medicine)  Cancer Staging No matching staging information was found for the patient.   Oncology History   Nov 2017- completed IM RT radiation therapy to his prostate and pelvic nodes for Gleason 7 (4+3) adenocarcinoma the prostate presenting the PSA of 7.8.  # JAN 2019- METASTATIC PROSTATE CA to Bone [low volume met on bone scan; CT- NED]; PSA ~50; March 25th 2019- Lupron 45 mg IM [Urology q 40M];  # May 23rd Zytiga 248m/day; no prednisone   # OBenita Stabile [place]]; Oct 2019- A.fib/not on eliquis; Dr.Khan[facility].   -------------------------------------------------------------------------  DIAGNOSIS: [ JAN 2019]- Met- PROSTATE CANCER  STAGE:  IV       ;GOALS: PALLIATIVE  CURRENT/MOST RECENT THERAPY [ march 2019]- Lupron; May 23rd 2019- Zytiga 252mday      Malignant neoplasm of prostate ([facility]     INTERVAL HISTORY:  JaCLETE [id number].o.  male pleasant patient above history of metastatic prostate cancer on ADT/Zytiga is here for follow-up.  Patient denies any recent syncopal episodes.  Continues to have intermittent hot flashes.  Otherwise no flareup of his A. Fib.  Continues to have intermittent dizziness especially on standing suddenly.  Otherwise no passing out spells.  No swelling in the legs.  Review of Systems  Constitutional: Negative for chills, diaphoresis, fever, malaise/fatigue and weight loss.  HENT: Negative for nosebleeds and sore throat.   Eyes: Negative for double vision.  Respiratory: Negative for cough, hemoptysis, sputum production, shortness of breath and wheezing.   Cardiovascular: Negative for chest pain, palpitations, orthopnea and leg swelling.  Gastrointestinal: Negative for abdominal pain, blood in stool, constipation, diarrhea, heartburn, melena, nausea and vomiting.  Genitourinary: Negative for dysuria, frequency and  urgency.  Musculoskeletal: Negative for back pain and joint pain.  Skin: Negative.  Negative for itching and rash.  Neurological: Positive for dizziness. Negative for tingling, focal weakness, weakness and headaches.  Endo/Heme/Allergies: Does not bruise/bleed easily.  Psychiatric/Behavioral: Negative for depression. The patient is not nervous/anxious and does not have insomnia.       PAST MEDICAL HISTORY :  Past Medical History:  Diagnosis Date  . Allergic rhinitis   . Barrett's esophagus 10/21/13  . Bone cancer ([facility]  . Chronic kidney disease    stones  . Colon polyp, hyperplastic   . COPD (chronic obstructive pulmonary disease) (HCC)    mild COPD. former smoker  . Coronary artery disease   . Diverticulosis   . Fundic gland polyps of stomach, benign   . Gastritis   . GERD (gastroesophageal reflux disease)   . Hypercholesteremia   . Hypertension   . Prostate cancer ([facility]  . Prostatism   . Reflux esophagitis   . Renal stones   . Skin cancer    left cheek/lesion excised  . Skin cancer   . Sleep apnea   . TIA (transient ischemic attack)   . TIA (transient ischemic attack)     PAST SURGICAL HISTORY :   Past Surgical History:  Procedure Laterality Date  . CARDIAC CATHETERIZATION    . COLONOSCOPY WITH PROPOFOL N/A 09/13/2015   Procedure: COLONOSCOPY WITH PROPOFOL;  Surgeon: MaLollie SailsMD;  [facility];  Service: Endoscopy;  Laterality: N/A;  . CORONARY ANGIOPLASTY    . ESOPHAGOGASTRODUODENOSCOPY (EGD) WITH PROPOFOL N/A 09/13/2015   Procedure: ESOPHAGOGASTRODUODENOSCOPY (EGD) WITH PROPOFOL;  Surgeon: MaLollie SailsMD;  [facility];  Service: Endoscopy;  Laterality: N/A;  . ESOPHAGOGASTRODUODENOSCOPY (EGD) WITH PROPOFOL N/A 12/28/2015   Procedure: ESOPHAGOGASTRODUODENOSCOPY (EGD) WITH PROPOFOL;  Surgeon: Lollie Sails, MD;  Location: [facility] ENDOSCOPY;  Service: Endoscopy;  Laterality: N/A;  . ESOPHAGOGASTRODUODENOSCOPY (EGD) WITH  PROPOFOL N/A 06/16/2016   Procedure: ESOPHAGOGASTRODUODENOSCOPY (EGD) WITH PROPOFOL;  Surgeon: Lollie Sails, MD;  [facility];  Service: Endoscopy;  Laterality: N/A;  . EYE SURGERY  2013   CATARACT EXTRACTION  . GANGLION CYST EXCISION Right 05/18/2017   Procedure: REMOVAL GANGLION CYST ANKLE;  Surgeon: Samara Deist, DPM;  Location: [facility] ORS;  Service: Podiatry;  Laterality: Right;  . heart cath stent    . LEFT HEART CATH AND CORONARY ANGIOGRAPHY Right 12/03/2017   Procedure: Left Heart Cath and Coronary Angiography with possible coronary intervention;  Surgeon: Dionisio David, MD;  Location: [place] CV LAB;  Service: Cardiovascular;  Laterality: Right;  . PROSTATE BIOPSY    . stents     multiple    FAMILY HISTORY :   Family History  Problem Relation Age of Onset  . Cancer Mother        gastric and lung  . Cancer Father        multiple myeloma  . Stroke Father   . Cancer Sister        leukemia  . Cancer Brother        leukemia  . Cancer Brother        kidney  . Cancer Daughter        Uterine  . Cancer Other        [place] (Sister's Son): Prostate    SOCIAL HISTORY:   Social History   Tobacco Use  . Smoking status: Former Smoker    Packs/day: 1.00    Years: 35.00    Pack years: 35.00    Types: Cigarettes    Last attempt to quit: 03/06/1986    Years since quitting: 32.0  . Smokeless tobacco: Former Systems developer    Types: Chew  Substance Use Topics  . Alcohol use: No  . Drug use: No    ALLERGIES:  is allergic to atorvastatin.  MEDICATIONS:  Current Outpatient Medications  Medication Sig Dispense Refill  . abiraterone acetate (ZYTIGA) 250 MG tablet Take 1 tablet (250 mg total) by mouth daily. 30 tablet 4  . aspirin EC 81 MG tablet Take 81 mg by mouth daily.    . calcium citrate-vitamin D (CITRACAL+D) 315-200 MG-UNIT tablet Take 1 tablet by mouth daily.    . cholecalciferol (VITAMIN D) 1000 units tablet Take 1,000 Units by mouth daily.     .  clopidogrel (PLAVIX) 75 MG tablet Take 1 tablet (75 mg total) by mouth daily. 90 tablet 0  . famotidine (PEPCID) 20 MG tablet Take 1 tablet (20 mg total) by mouth 2 (two) times daily. 30 tablet 3  . fexofenadine (ALLEGRA) 180 MG tablet Take 180 mg by mouth daily as needed for allergies or rhinitis.    . fluticasone (FLONASE) 50 MCG/ACT nasal spray Place 2 sprays into both nostrils daily as needed for allergies.     . Ipratropium-Albuterol (COMBIVENT) 20-100 MCG/ACT AERS respimat 2 puffs qid prn (Patient taking differently: Inhale 2 puffs into the lungs every 6 (six) hours as needed for wheezing. ) 1 Inhaler 3  . isosorbide mononitrate (IMDUR) 30 MG 24 hr tablet TAKE 1 TABLET BY MOUTH ONCE DAILY 90 tablet 2  . Leuprolide Acetate, 6 Month, (LUPRON) 45 MG injection Inject  45 mg into the muscle every 6 (six) months.    [name] losartan (COZAAR) 50 MG tablet Take 50 mg by mouth daily.    [name] lovastatin (MEVACOR) 40 MG tablet TAKE 2 TABLETS BY MOUTH AT BEDTIME 180 tablet 1  . Multiple Vitamin (MULTI-VITAMINS) TABS Take 1 tablet by mouth daily.     . pantoprazole (PROTONIX) 40 MG tablet Take 40 mg by mouth 2 (two) times daily.     . ranitidine (ZANTAC) 150 MG tablet Take 150 mg by mouth daily.     [name] amiodarone (PACERONE) 400 MG tablet Take 1 tablet (400 mg total) by mouth daily. 30 tablet 0  . nitroGLYCERIN (NITROSTAT) 0.4 MG SL tablet Place 0.4 mg under the tongue every 5 (five) minutes as needed for chest pain.     No current facility-administered medications for this visit.     PHYSICAL EXAMINATION: ECOG PERFORMANCE STATUS: 0 - Asymptomatic  BP (!) 170/89   Pulse (!) 55   Temp 97.8 F (36.6 C) (Tympanic)   Resp 20   There were no vitals filed for this visit.  Physical Exam  Constitutional: He is oriented to person, place, and time and well-developed, well-nourished, and in no distress.  He is accompanied by his wife.  Is walking himself.  HENT:  Head: Normocephalic and atraumatic.   Mouth/Throat: Oropharynx is clear and moist. No oropharyngeal exudate.  Eyes: Pupils are equal, round, and reactive to light.  Neck: Normal range of motion. Neck supple.  Cardiovascular: Normal rate and regular rhythm.  Pulmonary/Chest: No respiratory distress. He has no wheezes.  Abdominal: Soft. Bowel sounds are normal. He exhibits no distension and no mass. There is no abdominal tenderness. There is no rebound and no guarding.  Musculoskeletal: Normal range of motion.        General: No tenderness or edema.  Neurological: He is alert and oriented to person, place, and time.  Skin: Skin is warm.  Psychiatric: Affect normal.      LABORATORY DATA:  I have reviewed the data as listed    Component Value Date/Time   NA 141 03/13/2018 0937   NA 140 09/16/2013 0438   K 3.5 03/13/2018 0937   K 4.2 09/16/2013 0438   CL 108 03/13/2018 0937   CL 109 (H) 09/16/2013 0438   CO2 27 03/13/2018 0937   CO2 24 09/16/2013 0438   GLUCOSE 82 03/13/2018 0937   GLUCOSE 101 (H) 09/16/2013 0438   BUN 16 03/13/2018 0937   BUN 20 (H) 09/16/2013 0438   CREATININE 1.22 03/13/2018 0937   CREATININE 1.28 09/16/2013 0438   CALCIUM 9.1 03/13/2018 0937   CALCIUM 8.3 (L) 09/16/2013 0438   PROT 6.7 03/13/2018 0937   PROT 6.7 09/15/2013 0755   ALBUMIN 3.8 03/13/2018 0937   ALBUMIN 3.5 09/15/2013 0755   AST 26 03/13/2018 0937   AST 37 09/15/2013 0755   ALT 17 03/13/2018 0937   ALT 28 09/15/2013 0755   ALKPHOS 63 03/13/2018 0937   ALKPHOS 44 (L) 09/15/2013 0755   BILITOT 0.9 03/13/2018 0937   BILITOT 0.5 09/15/2013 0755   GFRNONAA 56 (L) 03/13/2018 0937   GFRNONAA 54 (L) 09/16/2013 0438   GFRAA >60 03/13/2018 0937   GFRAA >60 09/16/2013 0438    No results found for: SPEP, UPEP  Lab Results  Component Value Date   WBC 5.5 03/13/2018   NEUTROABS 2.8 03/13/2018   HGB 12.2 (L) 03/13/2018   HCT 37.4 (L) 03/13/2018   MCV 86.4 03/13/2018  PLT 196 03/13/2018      Chemistry      Component  Value Date/Time   NA 141 03/13/2018 0937   NA 140 09/16/2013 0438   K 3.5 03/13/2018 0937   K 4.2 09/16/2013 0438   CL 108 03/13/2018 0937   CL 109 (H) 09/16/2013 0438   CO2 27 03/13/2018 0937   CO2 24 09/16/2013 0438   BUN 16 03/13/2018 0937   BUN 20 (H) 09/16/2013 0438   CREATININE 1.22 03/13/2018 0937   CREATININE 1.28 09/16/2013 0438      Component Value Date/Time   CALCIUM 9.1 03/13/2018 0937   CALCIUM 8.3 (L) 09/16/2013 0438   ALKPHOS 63 03/13/2018 0937   ALKPHOS 44 (L) 09/15/2013 0755   AST 26 03/13/2018 0937   AST 37 09/15/2013 0755   ALT 17 03/13/2018 0937   ALT 28 09/15/2013 0755   BILITOT 0.9 03/13/2018 0937   BILITOT 0.5 09/15/2013 0755       RADIOGRAPHIC STUDIES: I have personally reviewed the radiological images as listed and agreed with the findings in the report. No results found.   ASSESSMENT & PLAN:  Malignant neoplasm of prostate ([place]) # Castrate sensitive-metastatic prostate cancer to the bone. Stage IV-on ADT/Zytiga low-dose. Lupron every 6 months [cancer center]. PSA 0.01.  Stable  #Continue Zytiga 250 mg once a day with food.  We will get a bone scan at next visit.  #Patient intolerant to intramuscular Lupron every 6 months [pain/question A. fib]; recommend Eligard sq. every 3 months instead.   #Labile blood pressures-recommend checking blood pressure at home and if consistently above 140-to inform PCP/ cardiology.  #A. fib paroxysmal-currently in sinus rhythm.  Not on anticoagulation.  Aspirin Plavix. Stable.   # DISPOSITION:  # follow up in 3rd week of march 2020; Eligard/ labs-cbc/cmp/PSA; bone scan prior-Dr.B  Cc; Dr.Scott.    Orders Placed This Encounter  Procedures  . NM Bone Scan Whole Body    Standing Status:   Future    Standing Expiration Date:   03/14/2019    Order Specific Question:   If indicated for the ordered procedure, I authorize the administration of a radiopharmaceutical per Radiology protocol    Answer:   Yes    Order  Specific Question:   Preferred imaging location?    Answer:   [facility]    Order Specific Question:   Radiology Contrast Protocol - do NOT remove file path    Answer:   [email or web address]    Order Specific Question:   ** REASON FOR EXAM (FREE TEXT)    Answer:   prostate cancer   All questions were answered. The patient knows to call the clinic with any problems, questions or concerns.      Cammie Sickle, MD 03/13/2018 12:27 PM

## 2018-03-13 NOTE — Assessment & Plan Note (Addendum)
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 Lupron every 6 months [cancer center]. PSA 0.01.  Stable  #Continue Zytiga 250 mg once a day with food.  We will get a bone scan at next visit.  #Patient intolerant to intramuscular Lupron every 6 months [pain/question A. fib]; recommend Eligard sq. every 3 months instead.   #Labile blood pressures-recommend checking blood pressure at home and if consistently above 140-to inform PCP/ cardiology.  #A. fib paroxysmal-currently in sinus rhythm.  Not on anticoagulation.  Aspirin Plavix. Stable.   # DISPOSITION:  # follow up in 3rd week of march 2020; Eligard/ labs-cbc/cmp/PSA; bone scan prior-Dr.B  Cc; Dr.Scott.

## 2018-03-15 DIAGNOSIS — G4733 Obstructive sleep apnea (adult) (pediatric): Secondary | ICD-10-CM | POA: Diagnosis not present

## 2018-03-19 DIAGNOSIS — L578 Other skin changes due to chronic exposure to nonionizing radiation: Secondary | ICD-10-CM | POA: Diagnosis not present

## 2018-03-19 DIAGNOSIS — L821 Other seborrheic keratosis: Secondary | ICD-10-CM | POA: Diagnosis not present

## 2018-03-19 DIAGNOSIS — L570 Actinic keratosis: Secondary | ICD-10-CM | POA: Diagnosis not present

## 2018-03-19 DIAGNOSIS — Z1283 Encounter for screening for malignant neoplasm of skin: Secondary | ICD-10-CM | POA: Diagnosis not present

## 2018-03-19 DIAGNOSIS — Z85828 Personal history of other malignant neoplasm of skin: Secondary | ICD-10-CM | POA: Diagnosis not present

## 2018-03-19 DIAGNOSIS — Z872 Personal history of diseases of the skin and subcutaneous tissue: Secondary | ICD-10-CM | POA: Diagnosis not present

## 2018-04-01 ENCOUNTER — Other Ambulatory Visit: Payer: Self-pay | Admitting: Internal Medicine

## 2018-04-03 MED FILL — ABIRATERONE ACETATE 250 MG: 250 | 30 days supply | Qty: 30 | Fill #4

## 2018-04-15 ENCOUNTER — Other Ambulatory Visit (INDEPENDENT_AMBULATORY_CARE_PROVIDER_SITE_OTHER): Payer: Medicare HMO

## 2018-04-15 DIAGNOSIS — I10 Essential (primary) hypertension: Secondary | ICD-10-CM

## 2018-04-15 DIAGNOSIS — E7800 Pure hypercholesterolemia, unspecified: Secondary | ICD-10-CM | POA: Diagnosis not present

## 2018-04-15 DIAGNOSIS — G4733 Obstructive sleep apnea (adult) (pediatric): Secondary | ICD-10-CM | POA: Diagnosis not present

## 2018-04-15 LAB — HEPATIC FUNCTION PANEL
ALK PHOS: 61 U/L (ref 39–117)
ALT: 23 U/L (ref 0–53)
AST: 29 U/L (ref 0–37)
Albumin: 3.8 g/dL (ref 3.5–5.2)
BILIRUBIN DIRECT: 0.1 mg/dL (ref 0.0–0.3)
BILIRUBIN TOTAL: 0.5 mg/dL (ref 0.2–1.2)
Total Protein: 6.5 g/dL (ref 6.0–8.3)

## 2018-04-15 LAB — LIPID PANEL
CHOL/HDL RATIO: 4
Cholesterol: 126 mg/dL (ref 0–200)
HDL: 34 mg/dL — ABNORMAL LOW (ref >39.00)
LDL Cholesterol: 75 mg/dL (ref 0–99)
NonHDL: 91.82
TRIGLYCERIDES: 82 mg/dL (ref 0.0–149.0)
VLDL: 16.4 mg/dL (ref 0.0–40.0)

## 2018-04-15 LAB — BASIC METABOLIC PANEL
BUN: 17 mg/dL (ref 6–23)
CALCIUM: 9.4 mg/dL (ref 8.4–10.5)
CHLORIDE: 107 meq/L (ref 96–112)
CO2: 28 meq/L (ref 19–32)
Creatinine, Ser: 1.35 mg/dL (ref 0.40–1.50)
GFR: 50.81 mL/min — ABNORMAL LOW (ref >60.00)
GLUCOSE: 104 mg/dL — AB (ref 70–99)
Potassium: 4.3 mEq/L (ref 3.5–5.1)
SODIUM: 142 meq/L (ref 135–145)

## 2018-04-17 ENCOUNTER — Ambulatory Visit: Payer: Medicare HMO | Admitting: Internal Medicine

## 2018-04-17 ENCOUNTER — Encounter: Payer: Self-pay | Admitting: Internal Medicine

## 2018-04-17 DIAGNOSIS — E7800 Pure hypercholesterolemia, unspecified: Secondary | ICD-10-CM

## 2018-04-17 DIAGNOSIS — I4891 Unspecified atrial fibrillation: Secondary | ICD-10-CM

## 2018-04-17 DIAGNOSIS — R739 Hyperglycemia, unspecified: Secondary | ICD-10-CM

## 2018-04-17 DIAGNOSIS — C61 Malignant neoplasm of prostate: Secondary | ICD-10-CM

## 2018-04-17 DIAGNOSIS — N189 Chronic kidney disease, unspecified: Secondary | ICD-10-CM

## 2018-04-17 DIAGNOSIS — K2270 Barrett's esophagus without dysplasia: Secondary | ICD-10-CM

## 2018-04-17 DIAGNOSIS — G4730 Sleep apnea, unspecified: Secondary | ICD-10-CM | POA: Diagnosis not present

## 2018-04-17 DIAGNOSIS — I739 Peripheral vascular disease, unspecified: Secondary | ICD-10-CM

## 2018-04-17 DIAGNOSIS — J449 Chronic obstructive pulmonary disease, unspecified: Secondary | ICD-10-CM

## 2018-04-17 DIAGNOSIS — I10 Essential (primary) hypertension: Secondary | ICD-10-CM | POA: Diagnosis not present

## 2018-04-17 DIAGNOSIS — I2510 Atherosclerotic heart disease of native coronary artery without angina pectoris: Secondary | ICD-10-CM | POA: Diagnosis not present

## 2018-04-17 DIAGNOSIS — I779 Disorder of arteries and arterioles, unspecified: Secondary | ICD-10-CM

## 2018-04-17 MED ORDER — FAMOTIDINE 20 MG PO TABS: 20.0000 mg | ORAL_TABLET | Freq: Two times a day (BID) | ORAL | 1 refills | Status: DC

## 2018-04-17 MED ORDER — AMLODIPINE BESYLATE 2.5 MG PO TABS: 2.5000 mg | ORAL_TABLET | Freq: Every day | ORAL | 1 refills | Status: DC

## 2018-04-17 NOTE — Progress Notes (Signed)
Patient ID: Nathaniel Thompson, male   DOB: 1935-05-15, 83 y.o.   MRN: [id number]   Subjective:    Patient ID: Nathaniel Thompson, male    DOB: [birth year], 83 y.o.   MRN: [id number]  HPI  Patient here for a scheduled follow up.   He is accompanied by his wife. History obtained from both of them.  Does feel better.  Energy is better.  No chest pain.  Breathing stable.  No acid reflux.  No abdominal pain.  Bowels moving.  Saw oncology 03/2018.  Discussed labs.  Discussed staying hydrated.     Past Medical History:  Diagnosis Date  . Allergic rhinitis   . Barrett's esophagus 10/21/13  . Bone cancer ([place])   . Chronic kidney disease    stones  . Colon polyp, hyperplastic   . COPD (chronic obstructive pulmonary disease) (HCC)    mild COPD. former smoker  . Coronary artery disease   . Diverticulosis   . Fundic gland polyps of stomach, benign   . Gastritis   . GERD (gastroesophageal reflux disease)   . Hypercholesteremia   . Hypertension   . Prostate cancer ([place])   . Prostatism   . Reflux esophagitis   . Renal stones   . Skin cancer    left cheek/lesion excised  . Skin cancer   . Sleep apnea   . TIA (transient ischemic attack)   . TIA (transient ischemic attack)    Past Surgical History:  Procedure Laterality Date  . CARDIAC CATHETERIZATION    . COLONOSCOPY WITH PROPOFOL N/A 09/13/2015   Procedure: COLONOSCOPY WITH PROPOFOL;  Surgeon: Lollie Sails, MD;  Location: [facility] ENDOSCOPY;  Service: Endoscopy;  Laterality: N/A;  . CORONARY ANGIOPLASTY    . ESOPHAGOGASTRODUODENOSCOPY (EGD) WITH PROPOFOL N/A 09/13/2015   Procedure: ESOPHAGOGASTRODUODENOSCOPY (EGD) WITH PROPOFOL;  Surgeon: Lollie Sails, MD;  Location[facility] ENDOSCOPY;  Service: Endoscopy;  Laterality: N/A;  . ESOPHAGOGASTRODUODENOSCOPY (EGD) WITH PROPOFOL N/A 12/28/2015   Procedure: ESOPHAGOGASTRODUODENOSCOPY (EGD) WITH PROPOFOL;  Surgeon: Lollie Sails, MD;  [facility]COPY;  Service: Endoscopy;  Laterality: N/A;    . ESOPHAGOGASTRODUODENOSCOPY (EGD) WITH PROPOFOL N/A 06/16/2016   Procedure: ESOPHAGOGASTRODUODENOSCOPY (EGD) WITH PROPOFOL;  Surgeon: Lollie Sails, MD;  Location: [facility] ENDOSCOPY;  Service: Endoscopy;  Laterality: N/A;  . EYE SURGERY  2013   CATARACT EXTRACTION  . GANGLION CYST EXCISION Right 05/18/2017   Procedure: REMOVAL GANGLION CYST ANKLE;  Surgeon: Samara Deist, DPM;  Location: [facility] ORS;  Service: Podiatry;  Laterality: Right;  . heart cath stent    . LEFT HEART CATH AND CORONARY ANGIOGRAPHY Right 12/03/2017   Procedure: Left Heart Cath and Coronary Angiography with possible coronary intervention;  Surgeon: Dionisio David, MD;  Location: [place] CV LAB;  Service: Cardiovascular;  Laterality: Right;  . PROSTATE BIOPSY    . stents     multiple   Family History  Problem Relation Age of Onset  . Cancer Mother        gastric and lung  . Cancer Father        multiple myeloma  . Stroke Father   . Cancer Sister        leukemia  . Cancer Brother        leukemia  . Cancer Brother        kidney  . Cancer Daughter        Uterine  . Cancer Other        [place] Son): Prostate  Social History   Socioeconomic History  . Marital status: Married    Spouse name: Not on file  . Number of children: Not on file  . Years of education: Not on file  . Highest education level: Not on file  Occupational History  . Not on file  Social Needs  . Financial resource strain: Not hard at all  . Food insecurity:    Worry: Never true    Inability: Never true  . Transportation needs:    Medical: No    Non-medical: No  Tobacco Use  . Smoking status: Former Smoker    Packs/day: 1.00    Years: 35.00    Pack years: 35.00    Types: Cigarettes    Last attempt to quit: 03/06/1986    Years since quitting: 32.1  . Smokeless tobacco: Former Systems developer    Types: Chew  Substance and Sexual Activity  . Alcohol use: No  . Drug use: No  . Sexual activity: Not Currently  Lifestyle  .  Physical activity:    Days per week: Not on file    Minutes per session: Not on file  . Stress: Not on file  Relationships  . Social connections:    Talks on phone: Not on file    Gets together: Not on file    Attends religious service: Not on file    Active member of club or organization: Not on file    Attends meetings of clubs or organizations: Not on file    Relationship status: Not on file  Other Topics Concern  . Not on file  Social History Narrative  . Not on file    Outpatient Encounter Medications as of 04/17/2018  Medication Sig  . abiraterone acetate (ZYTIGA) 250 MG tablet Take 1 tablet (250 mg total) by mouth daily.  [name] aspirin EC 81 MG tablet Take 81 mg by mouth daily.  . calcium citrate-vitamin D (CITRACAL+D) 315-200 MG-UNIT tablet Take 1 tablet by mouth daily.  . cholecalciferol (VITAMIN D) 1000 units tablet Take 1,000 Units by mouth daily.   . clopidogrel (PLAVIX) 75 MG tablet Take 1 tablet (75 mg total) by mouth daily.  . famotidine (PEPCID) 20 MG tablet Take 1 tablet (20 mg total) by mouth 2 (two) times daily.  . fexofenadine (ALLEGRA) 180 MG tablet Take 180 mg by mouth daily as needed for allergies or rhinitis.  . fluticasone (FLONASE) 50 MCG/ACT nasal spray Place 2 sprays into both nostrils daily as needed for allergies.   . Ipratropium-Albuterol (COMBIVENT) 20-100 MCG/ACT AERS respimat 2 puffs qid prn (Patient taking differently: Inhale 2 puffs into the lungs every 6 (six) hours as needed for wheezing. )  . isosorbide mononitrate (IMDUR) 30 MG 24 hr tablet TAKE 1 TABLET BY MOUTH ONCE DAILY  . Leuprolide Acetate, 6 Month, (LUPRON) 45 MG injection Inject 45 mg into the muscle every 6 (six) months.  [name] losartan (COZAAR) 50 MG tablet Take 50 mg by mouth daily.  [name] lovastatin (MEVACOR) 40 MG tablet TAKE 2 TABLETS BY MOUTH AT BEDTIME  . Multiple Vitamin (MULTI-VITAMINS) TABS Take 1 tablet by mouth daily.   . nitroGLYCERIN (NITROSTAT) 0.4 MG SL tablet Place 0.4 mg under  the tongue every 5 (five) minutes as needed for chest pain.  . pantoprazole (PROTONIX) 40 MG tablet Take 40 mg by mouth 2 (two) times daily.   . ranitidine (ZANTAC) 150 MG tablet Take 150 mg by mouth daily.   . [DISCONTINUED] famotidine (PEPCID) 20 MG tablet TAKE  1 TABLET BY MOUTH TWICE DAILY  . amiodarone (PACERONE) 400 MG tablet Take 1 tablet (400 mg total) by mouth daily.  [name] amLODipine (NORVASC) 2.5 MG tablet Take 1 tablet (2.5 mg total) by mouth daily.   No facility-administered encounter medications on file as of 04/17/2018.     Review of Systems  Constitutional: Negative for appetite change and unexpected weight change.  HENT: Negative for congestion and sinus pressure.   Respiratory: Negative for cough, chest tightness and shortness of breath.   Cardiovascular: Negative for chest pain, palpitations and leg swelling.  Gastrointestinal: Negative for abdominal pain, diarrhea, nausea and vomiting.  Genitourinary: Negative for difficulty urinating and dysuria.  Musculoskeletal: Negative for joint swelling and myalgias.  Skin: Negative for color change and rash.  Neurological: Negative for dizziness, light-headedness and headaches.  Psychiatric/Behavioral: Negative for agitation and dysphoric mood.       Objective:    Physical Exam Constitutional:      General: He is not in acute distress.    Appearance: Normal appearance. He is well-developed.  HENT:     Nose: Nose normal. No congestion.     Mouth/Throat:     Pharynx: No oropharyngeal exudate or posterior oropharyngeal erythema.  Cardiovascular:     Rate and Rhythm: Normal rate and regular rhythm.  Pulmonary:     Effort: Pulmonary effort is normal. No respiratory distress.     Breath sounds: Normal breath sounds.  Abdominal:     General: Bowel sounds are normal.     Palpations: Abdomen is soft.     Tenderness: There is no abdominal tenderness.  Musculoskeletal:        General: No swelling or tenderness.  Skin:     Findings: No erythema or rash.  Neurological:     Mental Status: He is alert.  Psychiatric:        Mood and Affect: Mood normal.        Behavior: Behavior normal.     BP (!) 160/90   Pulse 66   Temp 97.8 F (36.6 C) (Oral)   Wt 205 lb 9.6 oz (93.3 kg)   SpO2 98%   BMI 30.36 kg/m  Wt Readings from Last 3 Encounters:  04/17/18 205 lb 9.6 oz (93.3 kg)  02/04/18 206 lb 6.4 oz (93.6 kg)  01/29/18 206 lb 12.8 oz (93.8 kg)     Lab Results  Component Value Date   WBC 5.5 03/13/2018   HGB 12.2 (L) 03/13/2018   HCT 37.4 (L) 03/13/2018   PLT 196 03/13/2018   GLUCOSE 104 (H) 04/15/2018   CHOL 126 04/15/2018   TRIG 82.0 04/15/2018   HDL 34.00 (L) 04/15/2018   LDLCALC 75 04/15/2018   ALT 23 04/15/2018   AST 29 04/15/2018   NA 142 04/15/2018   K 4.3 04/15/2018   CL 107 04/15/2018   CREATININE 1.35 04/15/2018   BUN 17 04/15/2018   CO2 28 04/15/2018   TSH 2.864 11/26/2017   PSA 5.47 (H) 08/03/2016   HGBA1C 5.7 07/09/2017       Assessment & Plan:   Problem List Items Addressed This Visit    Atrial fibrillation, new onset ([place])    On amiodarone.  Followed by cardiology.        Relevant Medications   amLODipine (NORVASC) 2.5 MG tablet   Barrett's esophagus    Upper symptoms controlled.  On protonix.       CAD (coronary artery disease)    Recent cath - no significant CAD.  S/p previous stent placement.  On amiodarone. Continue f/u with cardiology.        Relevant Medications   amLODipine (NORVASC) 2.5 MG tablet   Carotid artery disease ([place])    Had CTA.  No hemodynamically significant stenosis.        Relevant Medications   amLODipine (NORVASC) 2.5 MG tablet   CKD (chronic kidney disease)    Follow metabolic panel.  Stay hydrated.  Avoid antiinflammatories.        Relevant Orders   Basic metabolic panel   COPD (chronic obstructive pulmonary disease) ([place])    Followed by pulmonary.        Hypercholesterolemia    On lovastatin.  Low cholesterol diet and  exercise.  Follow lipid panel and liver function tests.        Relevant Medications   amLODipine (NORVASC) 2.5 MG tablet   Hyperglycemia    Low carb diet and exercise.  Follow met b and a1c.        Hypertension, essential    Blood pressure remaining elevated.  Add amlodipine 2.25m q day.  Follow pressures.  Follow metabolic panel.        Relevant Medications   amLODipine (NORVASC) 2.5 MG tablet   Malignant neoplasm of prostate ([place]    Followed by oncology.  Continue zytiga.        Sleep apnea    Issues with dry mouth, etc.  Refer back to pulmonary for f/u.        Relevant Orders   Ambulatory referral to Pulmonology       CEinar Pheasant MD

## 2018-04-22 ENCOUNTER — Telehealth: Payer: Self-pay | Admitting: Radiology

## 2018-04-22 ENCOUNTER — Encounter: Payer: Self-pay | Admitting: Internal Medicine

## 2018-04-22 NOTE — Assessment & Plan Note (Signed)
Low carb diet and exercise.  Follow met b and a1c.   

## 2018-04-22 NOTE — Assessment & Plan Note (Signed)
On amiodarone.  Followed by cardiology.

## 2018-04-22 NOTE — Assessment & Plan Note (Signed)
Recent cath - no significant CAD.  S/p previous stent placement.  On amiodarone. Continue f/u with cardiology.

## 2018-04-22 NOTE — Telephone Encounter (Signed)
Pt coming in for labs tomorrow, please place future orders. Thank you.  

## 2018-04-22 NOTE — Assessment & Plan Note (Signed)
Follow metabolic panel.  Stay hydrated.  Avoid antiinflammatories.

## 2018-04-22 NOTE — Assessment & Plan Note (Signed)
Issues with dry mouth, etc.  Refer back to pulmonary for f/u.

## 2018-04-22 NOTE — Assessment & Plan Note (Signed)
Blood pressure remaining elevated.  Add amlodipine 2.5mg  q day.  Follow pressures.  Follow metabolic panel.

## 2018-04-22 NOTE — Assessment & Plan Note (Signed)
Followed by oncology.  Continue zytiga.

## 2018-04-22 NOTE — Assessment & Plan Note (Signed)
Upper symptoms controlled.  On protonix.  

## 2018-04-22 NOTE — Assessment & Plan Note (Signed)
On lovastatin.  Low cholesterol diet and exercise.  Follow lipid panel and liver function tests.   

## 2018-04-22 NOTE — Assessment & Plan Note (Signed)
Followed by pulmonary 

## 2018-04-22 NOTE — Assessment & Plan Note (Signed)
Had CTA.  No hemodynamically significant stenosis.   

## 2018-04-23 ENCOUNTER — Other Ambulatory Visit: Payer: Self-pay | Admitting: Internal Medicine

## 2018-04-23 ENCOUNTER — Other Ambulatory Visit (INDEPENDENT_AMBULATORY_CARE_PROVIDER_SITE_OTHER): Payer: Medicare HMO

## 2018-04-23 DIAGNOSIS — N189 Chronic kidney disease, unspecified: Secondary | ICD-10-CM | POA: Diagnosis not present

## 2018-04-23 DIAGNOSIS — G4730 Sleep apnea, unspecified: Secondary | ICD-10-CM | POA: Diagnosis not present

## 2018-04-23 DIAGNOSIS — R7989 Other specified abnormal findings of blood chemistry: Secondary | ICD-10-CM

## 2018-04-23 DIAGNOSIS — E785 Hyperlipidemia, unspecified: Secondary | ICD-10-CM | POA: Diagnosis not present

## 2018-04-23 DIAGNOSIS — I4891 Unspecified atrial fibrillation: Secondary | ICD-10-CM | POA: Diagnosis not present

## 2018-04-23 DIAGNOSIS — I739 Peripheral vascular disease, unspecified: Secondary | ICD-10-CM | POA: Diagnosis not present

## 2018-04-23 DIAGNOSIS — R55 Syncope and collapse: Secondary | ICD-10-CM | POA: Diagnosis not present

## 2018-04-23 DIAGNOSIS — I10 Essential (primary) hypertension: Secondary | ICD-10-CM | POA: Diagnosis not present

## 2018-04-23 DIAGNOSIS — R0602 Shortness of breath: Secondary | ICD-10-CM | POA: Diagnosis not present

## 2018-04-23 DIAGNOSIS — I2510 Atherosclerotic heart disease of native coronary artery without angina pectoris: Secondary | ICD-10-CM | POA: Diagnosis not present

## 2018-04-23 DIAGNOSIS — K219 Gastro-esophageal reflux disease without esophagitis: Secondary | ICD-10-CM | POA: Diagnosis not present

## 2018-04-23 LAB — BASIC METABOLIC PANEL
BUN: 19 mg/dL (ref 6–23)
CALCIUM: 9.3 mg/dL (ref 8.4–10.5)
CO2: 27 meq/L (ref 19–32)
CREATININE: 1.18 mg/dL (ref 0.40–1.50)
Chloride: 105 mEq/L (ref 96–112)
GFR: 59.34 mL/min — ABNORMAL LOW (ref >60.00)
Glucose, Bld: 81 mg/dL (ref 70–99)
Potassium: 3.9 mEq/L (ref 3.5–5.1)
Sodium: 141 mEq/L (ref 135–145)

## 2018-04-23 LAB — URINALYSIS, ROUTINE W REFLEX MICROSCOPIC
Bilirubin Urine: NEGATIVE
Hgb urine dipstick: NEGATIVE
Ketones, ur: NEGATIVE
Leukocytes,Ua: NEGATIVE
Nitrite: NEGATIVE
PH: 7 (ref 5.0–8.0)
RBC / HPF: NONE SEEN (ref 0–?)
Total Protein, Urine: NEGATIVE
Urine Glucose: NEGATIVE
Urobilinogen, UA: 0.2 (ref 0.0–1.0)
WBC UA: NONE SEEN (ref 0–?)

## 2018-04-23 NOTE — Progress Notes (Signed)
Order placed for f/u lab.   

## 2018-04-23 NOTE — Telephone Encounter (Signed)
Orders placed for f/u urinalysis and met b.  Thanks

## 2018-04-24 ENCOUNTER — Encounter: Payer: Self-pay | Admitting: Internal Medicine

## 2018-04-26 ENCOUNTER — Other Ambulatory Visit: Payer: Self-pay | Admitting: Internal Medicine

## 2018-04-30 ENCOUNTER — Ambulatory Visit: Payer: Medicare HMO | Admitting: Internal Medicine

## 2018-04-30 ENCOUNTER — Encounter: Payer: Self-pay | Admitting: Internal Medicine

## 2018-04-30 ENCOUNTER — Ambulatory Visit
Admission: RE | Admit: 2018-04-30 | Discharge: 2018-04-30 | Disposition: A | Discharge status: Home / Self Care | Payer: Medicare HMO | Source: Ambulatory Visit | Attending: Internal Medicine | Admitting: Internal Medicine

## 2018-04-30 VITALS — BP 124/78 | HR 60 | Ht 70.0 in | Wt 206.6 lb

## 2018-04-30 DIAGNOSIS — G4733 Obstructive sleep apnea (adult) (pediatric): Secondary | ICD-10-CM | POA: Insufficient documentation

## 2018-04-30 DIAGNOSIS — J449 Chronic obstructive pulmonary disease, unspecified: Secondary | ICD-10-CM

## 2018-04-30 DIAGNOSIS — R0602 Shortness of breath: Secondary | ICD-10-CM | POA: Diagnosis not present

## 2018-04-30 DIAGNOSIS — R0609 Other forms of dyspnea: Secondary | ICD-10-CM

## 2018-04-30 NOTE — Patient Instructions (Addendum)
We will order new CPAP AutoPap range 10-20 cm H2O.  Try to use the CPAP every night for the entire night.  Call your company to see if you can change masks. If not call us back and we can refer you to our mask fitting clinic in [place].   Will send for chest x ray and lung function test.

## 2018-04-30 NOTE — Progress Notes (Signed)
[facility] Consultation      Assessment and Plan:  Obstructive sleep apnea. - Known history of obstructive sleep apnea, currently he has malfunctioning with his current machine.  Also complained of excessive dry mouth, which is making him intolerant. -We we will adjust CPAP AutoPap range to 10-20 cm H2O. - Currently having some issues with mask leak causing discomfort.  Offered to refer the patient to mask fitting clinic but he has not wanted to go up to [place].  Asked that he call his DME supplier for a new mask.  Atrial fibrillation, transient ischemic attack, essential hypertension. - Above conditions can be contributed to by obstructive sleep apnea, therefore treatment of sleep apnea is important part of their management.  Dyspnea on exertion. - Currently being evaluated by cardiology.  Patient had been seen by Nathaniel Thompson several years ago, though those records are not available. - We will check chest x-ray and pulmonary function test to evaluate further.  Return in about 3 months (around 07/29/2018).   Date: 04/30/2018  MRN# [id number] Nathaniel Thompson 08-25-1935    Nathaniel Thompson is a 83 y.o. old male seen in consultation for chief complaint of:    Chief Complaint  Patient presents with  . Follow-up    F/U for cpap. States his cpap works well most of the time, he does have some trouble with the seal on his mask.     HPI:   The patient is an 83 year old male with history of obstructive sleep apnea, atrial fibrillation.  He had previously been on CPAP and was using it regularly but stopped using it about a year ago. He noted that he was getting dry mouth, also the CPAP was not turning off in the AM and he would have to unplug it.  He is sleepy during the day, he was diagnosed with Afib, he was hospitalized recently.  He tried using his old CPAP machine after the hospitalization, he is still waking up with dry mouth.   He notes that he is winded with  mild activity. He smoked until 1989, smoked ppd until that time. He has never been diagnosed with COPD, he has seen Nathaniel Thompson once several years ago and told that his breathing was doing well.   **CPAP download 03/31/2018-04/29/2018>> raw data personally reviewed.  Usage greater than 4 hours is 20/30 days.  Average usage on days used is 5 hours 11 minutes.  Pressure ranges 5-20.  Median pressure 13, 95th percentile pressure 16, max pressure 17.5.  Leaks are within normal limits.  Residual AHI is 4.  Overall this shows inadequate compliance with CPAP with good control of obstructive sleep apnea when used. **CPAP titration 12/24/2009>> CPAP was recommended at a pressure of 13. **Review of old records, previous sleep study 12/02/2009.  AHI of 20, consistent with moderate obstructive sleep apnea.  CPAP was recommended.  Medication:    Current Outpatient Medications:  .  abiraterone acetate (ZYTIGA) 250 MG tablet, Take 1 tablet (250 mg total) by mouth daily., Disp: 30 tablet, Rfl: 4 .  amLODipine (NORVASC) 2.5 MG tablet, Take 1 tablet (2.5 mg total) by mouth daily., Disp: 90 tablet, Rfl: 1 .  aspirin EC 81 MG tablet, Take 81 mg by mouth daily., Disp: , Rfl:  .  calcium citrate-vitamin D (CITRACAL+D) 315-200 MG-UNIT tablet, Take 1 tablet by mouth daily., Disp: , Rfl:  .  cholecalciferol (VITAMIN D) 1000 units tablet, Take 1,000 Units by mouth daily. , Disp: , Rfl:  .  clopidogrel (PLAVIX) 75 MG tablet, Take 1 tablet (75 mg total) by mouth daily., Disp: 90 tablet, Rfl: 0 .  famotidine (PEPCID) 20 MG tablet, Take 1 tablet (20 mg total) by mouth 2 (two) times daily., Disp: 180 tablet, Rfl: 1 .  fexofenadine (ALLEGRA) 180 MG tablet, Take 180 mg by mouth daily as needed for allergies or rhinitis., Disp: , Rfl:  .  fluticasone (FLONASE) 50 MCG/ACT nasal spray, Place 2 sprays into both nostrils daily as needed for allergies. , Disp: , Rfl:  .  hydrochlorothiazide (HYDRODIURIL) 12.5 MG tablet, TAKE 1 TABLET BY  MOUTH ONCE EVERY MORNING, Disp: 90 tablet, Rfl: 1 .  Ipratropium-Albuterol (COMBIVENT) 20-100 MCG/ACT AERS respimat, 2 puffs qid prn (Patient taking differently: Inhale 2 puffs into the lungs every 6 (six) hours as needed for wheezing. ), Disp: 1 Inhaler, Rfl: 3 .  isosorbide mononitrate (IMDUR) 30 MG 24 hr tablet, TAKE 1 TABLET BY MOUTH ONCE DAILY, Disp: 90 tablet, Rfl: 2 .  Leuprolide Acetate, 6 Month, (LUPRON) 45 MG injection, Inject 45 mg into the muscle every 6 (six) months., Disp: , Rfl:  .  losartan (COZAAR) 50 MG tablet, Take 50 mg by mouth daily., Disp: , Rfl:  .  lovastatin (MEVACOR) 40 MG tablet, TAKE 2 TABLETS BY MOUTH AT BEDTIME, Disp: 180 tablet, Rfl: 1 .  Multiple Vitamin (MULTI-VITAMINS) TABS, Take 1 tablet by mouth daily. , Disp: , Rfl:  .  nitroGLYCERIN (NITROSTAT) 0.4 MG SL tablet, Place 0.4 mg under the tongue every 5 (five) minutes as needed for chest pain., Disp: , Rfl:  .  pantoprazole (PROTONIX) 40 MG tablet, Take 40 mg by mouth 2 (two) times daily. , Disp: , Rfl:  .  amiodarone (PACERONE) 400 MG tablet, Take 1 tablet (400 mg total) by mouth daily., Disp: 30 tablet, Rfl: 0   Allergies:  Atorvastatin  Review of Systems:  Constitutional: Feels well. Cardiovascular: Denies chest pain, exertional chest pain.  Pulmonary: Denies hemoptysis, pleuritic chest pain.   The remainder of systems were reviewed and were found to be negative other than what is documented in the HPI.    Physical Examination:   VS: BP 124/78   Pulse 60   Ht 5\' 10"  (1.778 m)   Wt 206 lb 9.6 oz (93.7 kg)   SpO2 98%   BMI 29.64 kg/m   General Appearance: No distress  Neuro:without focal findings, mental status, speech normal, alert and oriented HEENT: PERRLA, EOM intact Pulmonary: No wheezing, No rales  CardiovascularNormal S1,S2.  No m/r/g.  Abdomen: Benign, Soft, non-tender, No masses Renal:  No costovertebral tenderness  GU:  No performed at this time. Endoc: No evident thyromegaly, no  signs of acromegaly or Cushing features Skin:   warm, no rashes, no ecchymosis  Extremities: normal, no cyanosis, clubbing.      LABORATORY PANEL:   CBC No results for input(s): WBC, HGB, HCT, PLT in the last 168 hours. ------------------------------------------------------------------------------------------------------------------  Chemistries  No results for input(s): NA, K, CL, CO2, GLUCOSE, BUN, CREATININE, CALCIUM, MG, AST, ALT, ALKPHOS, BILITOT in the last 168 hours.  Invalid input(s): GFRCGP ------------------------------------------------------------------------------------------------------------------  Cardiac Enzymes No results for input(s): TROPONINI in the last 168 hours. ------------------------------------------------------------  RADIOLOGY:  No results found.     Thank  you for the consultation and for allowing [facility] to assist in the care of your patient. Our recommendations are noted above.  Please contact us if we can be of further service.   Deep Ashby Dawes,  M.D., F.C.C.P.  Board Certified in Internal Medicine, Pulmonary Medicine, [place], and Sleep Medicine.  [facility] Office Number: [phone]   04/30/2018

## 2018-05-09 ENCOUNTER — Other Ambulatory Visit: Payer: Self-pay | Admitting: Internal Medicine

## 2018-05-09 NOTE — Telephone Encounter (Signed)
[place])   Ref Range & Units 23mo ago 25mo ago 24mo ago 29mo ago 59mo ago 31mo ago 46mo ago  WBC 4.0 - 10.5 K/uL 5.5  5.3  5.4 R 4.4 R 4.9 R 4.2 R 5.7 R  RBC 4.22 - 5.81 MIL/uL 4.33  3.88Low   4.59 R 4.38Low  R 4.18Low  R 4.30Low  R 4.35Low  R  Hemoglobin 13.0 - 17.0 g/dL 12.2Low   11.3Low   13.7 R 13.1 R 12.8Low  R 13.2 R 13.2 R  HCT 39.0 - 52.0 % 37.4Low   33.7Low   39.7Low  R 37.9Low  R 36.8Low  R 38.1Low  R 38.0Low  R  MCV 80.0 - 100.0 fL 86.4  86.9  86.5  86.5  88.0  88.6  87.5   MCH 26.0 - 34.0 pg 28.2  29.1  29.9  29.9  30.5  30.6  30.3   MCHC 30.0 - 36.0 g/dL 32.6  33.5  34.5 R 34.6 R 34.7 R 34.6 R 34.7 R  RDW 11.5 - 15.5 % 13.6  13.7  14.0 R 13.8 R 13.2 R 13.2 R 14.0 R  Platelets 150 - 400 K/uL 196  174  182 R, CM 193 R 173 R 175 R 213 R  nRBC 0.0 - 0.2 % 0.0  0.0        Neutrophils Relative % % 50  58   55  56  56  58   Neutro Abs 1.7 - 7.7 K/uL 2.8  3.1   2.5 R 2.8 R 2.4 R 3.3 R  Lymphocytes Relative % 29  23   26  26  27  25    Lymphs Abs 0.7 - 4.0 K/uL 1.6  1.2   1.1 R 1.3 R 1.1 R 1.4 R  Monocytes Relative % 13  13   12  12  11  12    Monocytes Absolute 0.1 - 1.0 K/uL 0.7  0.7   0.5 R 0.6 R 0.5 R 0.7 R  Eosinophils Relative % 7  5   6  5  5  4    Eosinophils Absolute 0.0 - 0.5 K/uL 0.4  0.3   0.3 R 0.2 R 0.2 R 0.2 R  Basophils Relative % 1  1   1  1  1  1    Basophils Absolute 0.0 - 0.1 K/uL 0.0  0.0   0.0 R, CM 0.0 R, CM 0.0 R, CM 0.0 R, CM  Immature Granulocytes % 0  0        Abs Immature Granulocytes 0.00 - 0.07 K/uL 0.01  0.01 CM       Comment: Performed at [facility], [address] [place], [place] [address]  Resulting Agency  [facility] [place] [facility] [place] [facility] [place] [facility] [place] [facility] [place] [facility] [place] [facility]      Specimen Collected: 03/13/18 09:37  Last Resulted: 03/13/18 09:48       ...   Ref Range & Units 2wk ago  Sodium 135 - 145 mEq/L 141   Potassium 3.5 - 5.1 mEq/L 3.9   Chloride 96 - 112 mEq/L 105   CO2 19 - 32 mEq/L 27   Glucose, Bld 70 - 99 mg/dL 81    BUN 6 - 23 mg/dL 19   Creatinine, Ser 0.40 - 1.50 mg/dL 1.18   Calcium 8.4 - 10.5 mg/dL 9.3   GFR >60.00 mL/min 59.34Low    No[facility]  Specimen Collected: 04/23/18 09:36  Last Resulted: 04/23/18 15:11

## 2018-05-14 DIAGNOSIS — K219 Gastro-esophageal reflux disease without esophagitis: Secondary | ICD-10-CM | POA: Diagnosis not present

## 2018-05-14 DIAGNOSIS — E785 Hyperlipidemia, unspecified: Secondary | ICD-10-CM | POA: Diagnosis not present

## 2018-05-14 DIAGNOSIS — R55 Syncope and collapse: Secondary | ICD-10-CM | POA: Diagnosis not present

## 2018-05-14 DIAGNOSIS — R0602 Shortness of breath: Secondary | ICD-10-CM | POA: Diagnosis not present

## 2018-05-14 DIAGNOSIS — I10 Essential (primary) hypertension: Secondary | ICD-10-CM | POA: Diagnosis not present

## 2018-05-14 DIAGNOSIS — I2510 Atherosclerotic heart disease of native coronary artery without angina pectoris: Secondary | ICD-10-CM | POA: Diagnosis not present

## 2018-05-14 DIAGNOSIS — G4730 Sleep apnea, unspecified: Secondary | ICD-10-CM | POA: Diagnosis not present

## 2018-05-14 DIAGNOSIS — I4891 Unspecified atrial fibrillation: Secondary | ICD-10-CM | POA: Diagnosis not present

## 2018-05-14 DIAGNOSIS — G4733 Obstructive sleep apnea (adult) (pediatric): Secondary | ICD-10-CM | POA: Diagnosis not present

## 2018-05-14 DIAGNOSIS — I739 Peripheral vascular disease, unspecified: Secondary | ICD-10-CM | POA: Diagnosis not present

## 2018-05-14 MED FILL — ABIRATERONE ACETATE 250 MG: 250 | 30 days supply | Qty: 30 | Fill #0

## 2018-05-20 ENCOUNTER — Ambulatory Visit
Admission: RE | Admit: 2018-05-20 | Discharge: 2018-05-20 | Disposition: A | Discharge status: Home / Self Care | Payer: Medicare HMO | Source: Ambulatory Visit | Attending: Internal Medicine | Admitting: Internal Medicine

## 2018-05-20 ENCOUNTER — Other Ambulatory Visit: Payer: Self-pay

## 2018-05-20 DIAGNOSIS — C61 Malignant neoplasm of prostate: Secondary | ICD-10-CM | POA: Insufficient documentation

## 2018-05-20 MED ORDER — TECHNETIUM TC 99M MEDRONATE IV KIT
23.0700 | PACK | Freq: Once | INTRAVENOUS | Status: AC | PRN
  Administered 2018-05-20: 23.07 via INTRAVENOUS

## 2018-05-21 ENCOUNTER — Telehealth: Payer: Self-pay | Admitting: Internal Medicine

## 2018-05-21 ENCOUNTER — Other Ambulatory Visit: Payer: Self-pay | Admitting: Internal Medicine

## 2018-05-21 NOTE — Telephone Encounter (Signed)
Spoke to pt re: bone scan results; recommend follow up in 1 month from now;lupron/labs [covid]  GB

## 2018-05-22 ENCOUNTER — Inpatient Hospital Stay: Payer: Medicare HMO

## 2018-05-22 ENCOUNTER — Inpatient Hospital Stay: Payer: Medicare HMO | Admitting: Internal Medicine

## 2018-05-30 ENCOUNTER — Telehealth: Payer: Self-pay | Admitting: Pharmacist

## 2018-05-30 NOTE — Telephone Encounter (Signed)
Oral Chemotherapy Pharmacist Encounter   Mrs. Borjon called and left a VM for me. I returned her call. She received a letter in the mail from [employer] stating that Mr. Kallal fund was expiring in April 2020. Reviewed past chart notes for the patients two grants, [employer] expires 06/20/18 and Cancer Care expires 06/19/18. Currently there are no copay funds available for prostate cancer. I have placed Mr. Prude on our copay assistance wait list.   Mr. Pape has a suplus on hand. I asked Mrs. Hayduk to give me a call when they have one month left, if we have not found new grant assistance form them by then, we will sign him up for manufacture assistance. Mrs. Burdi stated her understanding of the paln.   Darl Pikes, PharmD, BCPS, [facility] Hematology/Oncology Clinical Pharmacist [facility] [phone]  05/30/2018 1:28 PM

## 2018-06-03 ENCOUNTER — Telehealth: Payer: Self-pay | Admitting: Pharmacist

## 2018-06-03 NOTE — Telephone Encounter (Signed)
Oral Chemotherapy Pharmacist Encounter  Successfully enrolled patient for copayment assistance funds from [facility] from the Prostate Cancer fund.  Award amount: $7,300 Effective dates: 03/05/2018 - 06/02/2019 ID: [id number] BIN: [id number] Group: [id number] PCN: [id number]  Billing information will be shared with [place]. I will place a copy of the award letter to be scanned into patient's chart. Patient notified.  Darl Pikes, PharmD, BCPS Hematology/Oncology Clinical Pharmacist [facility] [phone]  06/03/2018 12:22 PM

## 2018-06-12 DIAGNOSIS — R42 Dizziness and giddiness: Secondary | ICD-10-CM | POA: Diagnosis not present

## 2018-06-12 MED FILL — ABIRATERONE ACETATE 250 MG: 250 | 30 days supply | Qty: 30 | Fill #1

## 2018-06-14 DIAGNOSIS — G4733 Obstructive sleep apnea (adult) (pediatric): Secondary | ICD-10-CM | POA: Diagnosis not present

## 2018-06-25 ENCOUNTER — Other Ambulatory Visit: Payer: Self-pay

## 2018-06-25 DIAGNOSIS — C61 Malignant neoplasm of prostate: Secondary | ICD-10-CM

## 2018-06-26 ENCOUNTER — Encounter: Payer: Self-pay | Admitting: Internal Medicine

## 2018-06-26 ENCOUNTER — Other Ambulatory Visit: Payer: Self-pay

## 2018-06-26 ENCOUNTER — Inpatient Hospital Stay: Payer: Medicare HMO

## 2018-06-26 ENCOUNTER — Inpatient Hospital Stay: Payer: Medicare HMO | Attending: Internal Medicine

## 2018-06-26 ENCOUNTER — Inpatient Hospital Stay (HOSPITAL_BASED_OUTPATIENT_CLINIC_OR_DEPARTMENT_OTHER): Payer: Medicare HMO | Admitting: Internal Medicine

## 2018-06-26 DIAGNOSIS — C61 Malignant neoplasm of prostate: Secondary | ICD-10-CM

## 2018-06-26 DIAGNOSIS — C7951 Secondary malignant neoplasm of bone: Secondary | ICD-10-CM | POA: Insufficient documentation

## 2018-06-26 LAB — COMPREHENSIVE METABOLIC PANEL
ALT: 17 U/L (ref 0–44)
AST: 24 U/L (ref 15–41)
Albumin: 3.7 g/dL (ref 3.5–5.0)
Alkaline Phosphatase: 64 U/L (ref 38–126)
Anion gap: 5 (ref 5–15)
BUN: 18 mg/dL (ref 8–23)
CO2: 26 mmol/L (ref 22–32)
Calcium: 8.9 mg/dL (ref 8.9–10.3)
Chloride: 108 mmol/L (ref 98–111)
Creatinine, Ser: 1.31 mg/dL — ABNORMAL HIGH (ref 0.61–1.24)
GFR calc Af Amer: 59 mL/min — ABNORMAL LOW (ref >60)
GFR calc non Af Amer: 51 mL/min — ABNORMAL LOW (ref >60)
Glucose, Bld: 114 mg/dL — ABNORMAL HIGH (ref 70–99)
Potassium: 3.3 mmol/L — ABNORMAL LOW (ref 3.5–5.1)
Sodium: 139 mmol/L (ref 135–145)
Total Bilirubin: 0.6 mg/dL (ref 0.3–1.2)
Total Protein: 6.9 g/dL (ref 6.5–8.1)

## 2018-06-26 LAB — CBC WITH DIFFERENTIAL/PLATELET
Abs Immature Granulocytes: 0.01 10*3/uL (ref 0.00–0.07)
Basophils Absolute: 0 10*3/uL (ref 0.0–0.1)
Basophils Relative: 1 %
Eosinophils Absolute: 0.2 10*3/uL (ref 0.0–0.5)
Eosinophils Relative: 4 %
HCT: 36.9 % — ABNORMAL LOW (ref 39.0–52.0)
Hemoglobin: 12.6 g/dL — ABNORMAL LOW (ref 13.0–17.0)
Immature Granulocytes: 0 %
Lymphocytes Relative: 29 %
Lymphs Abs: 1.2 10*3/uL (ref 0.7–4.0)
MCH: 29.6 pg (ref 26.0–34.0)
MCHC: 34.1 g/dL (ref 30.0–36.0)
MCV: 86.8 fL (ref 80.0–100.0)
Monocytes Absolute: 0.4 10*3/uL (ref 0.1–1.0)
Monocytes Relative: 10 %
Neutro Abs: 2.4 10*3/uL (ref 1.7–7.7)
Neutrophils Relative %: 56 %
Platelets: 203 10*3/uL (ref 150–400)
RBC: 4.25 MIL/uL (ref 4.22–5.81)
RDW: 13.7 % (ref 11.5–15.5)
WBC: 4.3 10*3/uL (ref 4.0–10.5)
nRBC: 0 % (ref 0.0–0.2)

## 2018-06-26 LAB — PSA: Prostatic Specific Antigen: 0.02 ng/mL (ref 0.00–4.00)

## 2018-06-26 MED ORDER — LEUPROLIDE ACETATE (3 MONTH) 22.5 MG ~~LOC~~ KIT
22.5000 mg | PACK | Freq: Once | SUBCUTANEOUS | Status: AC
  Administered 2018-06-26: 14:00:00 22.5 mg via SUBCUTANEOUS
  Filled 2018-06-26: qty 22.5

## 2018-06-26 NOTE — Progress Notes (Signed)
I connected with Nathaniel Thompson on 06/26/18 at  2:45 PM EDT by telephone visit and verified that I am speaking with the correct person using two identifiers.  I discussed the limitations, risks, security and privacy concerns of performing an evaluation and management service by telemedicine and the availability of in-person appointments. I also discussed with the patient that there may be a patient responsible charge related to this service. The patient expressed understanding and agreed to proceed.    Other persons participating in the visit and their role in the encounter: Wife.    Patient's location: home Provider's location: home   Oncology History   Nov 2017- completed IM RT radiation therapy to his prostate and pelvic nodes for Gleason 7 (4+3) adenocarcinoma the prostate presenting the PSA of 7.8.  # JAN 2019- METASTATIC PROSTATE CA to Bone [low volume met on bone scan; CT- NED]; PSA ~50; March 25th 2019- Lupron 45 mg IM [Urology q 19M]; June 26, 2018-Eligard every 3 months[intolerance to Lupron/question A. Fib/injection site pain  # May 23rd Zytiga 2104m/day; no prednisone   # O[facility]ita Stabile [place]]; Oct 2019- A.fib/not on eliquis; Dr.Khan/[facility].   ------------------------------------------------------------------  DIAGNOSIS: [ JAN 2019]- Met- PROSTATE CANCER  STAGE:  IV       ;GOALS: PALLIATIVE  CURRENT/MOST RECENT THERAPY [ march 2019]- Lupron; May 23rd 2019- Zytiga 2556mday      [facility]    Chief Complaint: Prostate cancer    History of present illness:Nathaniel R JeDecoteau20.o.  male with history of metastatic prostate cancer.  Patient is currently on Zytiga.  Patient denies any back pain or nausea vomiting or diarrhea or swelling in the legs.   Observation/objective: Hemoglobin 12.8 creatinine 1.3 stable.  PSA pending.  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 Stage IV-on ADT/Zytiga low-dose. Bone scan March 70 2020-L3 uptake stable; otherwise treated disease.  Stable  # Continue Zytiga 250 mg once a day with food.    #Switch over to Eligard/intolerance; first injection today every 3 months.  #Labile blood pressures-stable as per patient  #A. fib paroxysmal-currently in sinus rhythm.  Not on anticoagulation.  Aspirin Plavix.  Stable  # DISPOSITION:  # follow up in 3 months; Eligard/ labs-cbc/cmp/PSA-Dr.B  Cc; Dr.Scott.     Follow-up instructions:  I discussed the assessment and treatment plan with the patient.  The patient was provided an opportunity to ask questions and all were answered.  The patient agreed with the plan and demonstrated understanding of instructions.  The patient was advised to call back or seek an in person evaluation if the symptoms worsen or if the condition fails to improve as anticipated.  I provided 12 minutes of non face-to-face telephone visit time during this encounter, and > 50% was spent counseling as documented under my assessment & plan.   Dr. GoCharlaine DaltonHReservet [facility]Mem[facility]/22/2020 2:34 PM

## 2018-06-26 NOTE — Assessment & Plan Note (Addendum)
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 Bone scan March 70 2020-L3 uptake stable; otherwise treated disease.  Stable  # Continue Zytiga 250 mg once a day with food.    #Switch over to Eligard/intolerance; first injection today every 3 months.  #Labile blood pressures-stable as per patient  #A. fib paroxysmal-currently in sinus rhythm.  Not on anticoagulation.  Aspirin Plavix.  Stable  # DISPOSITION:  # follow up in 3 months; Eligard/ labs-cbc/cmp/PSA-Dr.B  Cc; Dr.Scott.

## 2018-06-28 ENCOUNTER — Ambulatory Visit: Payer: Medicare HMO | Admitting: Internal Medicine

## 2018-07-09 ENCOUNTER — Other Ambulatory Visit: Payer: Self-pay | Admitting: Internal Medicine

## 2018-07-14 DIAGNOSIS — G4733 Obstructive sleep apnea (adult) (pediatric): Secondary | ICD-10-CM | POA: Diagnosis not present

## 2018-07-15 ENCOUNTER — Ambulatory Visit (INDEPENDENT_AMBULATORY_CARE_PROVIDER_SITE_OTHER): Payer: Medicare HMO | Admitting: Internal Medicine

## 2018-07-15 ENCOUNTER — Other Ambulatory Visit: Payer: Self-pay

## 2018-07-15 ENCOUNTER — Encounter: Payer: Self-pay | Admitting: Internal Medicine

## 2018-07-15 ENCOUNTER — Ambulatory Visit (INDEPENDENT_AMBULATORY_CARE_PROVIDER_SITE_OTHER): Payer: Medicare HMO

## 2018-07-15 DIAGNOSIS — I779 Disorder of arteries and arterioles, unspecified: Secondary | ICD-10-CM

## 2018-07-15 DIAGNOSIS — I4891 Unspecified atrial fibrillation: Secondary | ICD-10-CM | POA: Diagnosis not present

## 2018-07-15 DIAGNOSIS — K2270 Barrett's esophagus without dysplasia: Secondary | ICD-10-CM | POA: Diagnosis not present

## 2018-07-15 DIAGNOSIS — I2510 Atherosclerotic heart disease of native coronary artery without angina pectoris: Secondary | ICD-10-CM

## 2018-07-15 DIAGNOSIS — J449 Chronic obstructive pulmonary disease, unspecified: Secondary | ICD-10-CM | POA: Diagnosis not present

## 2018-07-15 DIAGNOSIS — Z0000 Encounter for general adult medical examination without abnormal findings: Secondary | ICD-10-CM | POA: Diagnosis not present

## 2018-07-15 DIAGNOSIS — E7800 Pure hypercholesterolemia, unspecified: Secondary | ICD-10-CM

## 2018-07-15 DIAGNOSIS — N189 Chronic kidney disease, unspecified: Secondary | ICD-10-CM

## 2018-07-15 DIAGNOSIS — R569 Unspecified convulsions: Secondary | ICD-10-CM | POA: Diagnosis not present

## 2018-07-15 DIAGNOSIS — G4730 Sleep apnea, unspecified: Secondary | ICD-10-CM

## 2018-07-15 DIAGNOSIS — R739 Hyperglycemia, unspecified: Secondary | ICD-10-CM

## 2018-07-15 DIAGNOSIS — I739 Peripheral vascular disease, unspecified: Secondary | ICD-10-CM | POA: Diagnosis not present

## 2018-07-15 DIAGNOSIS — I10 Essential (primary) hypertension: Secondary | ICD-10-CM | POA: Diagnosis not present

## 2018-07-15 DIAGNOSIS — C61 Malignant neoplasm of prostate: Secondary | ICD-10-CM | POA: Diagnosis not present

## 2018-07-15 DIAGNOSIS — R42 Dizziness and giddiness: Secondary | ICD-10-CM

## 2018-07-15 MED FILL — ABIRATERONE ACETATE 250 MG: 250 | 30 days supply | Qty: 30 | Fill #2

## 2018-07-15 NOTE — Progress Notes (Signed)
Subjective:   Nathaniel Thompson is a 83 y.o. male who presents for Medicare Annual/Subsequent preventive examination.  Charting later than appointment time due to system failure.    Review of Systems:  No ROS.  Medicare Wellness Virtual Visit.  Visual/audio telehealth visit, UTA vital signs.   See social history for additional risk factors.  Cardiac Risk Factors include: advanced age (>49mn, >>22women);hypertension     Objective:    Vitals: There were no vitals taken for this visit.  There is no height or weight on file to calculate BMI.  Advanced Directives 07/15/2018 06/26/2018 03/13/2018 12/03/2017 11/27/2017 11/27/2017 11/08/2017  Does Patient Have a Medical Advance Directive? Yes Yes Yes Yes Yes Yes No  Type of Advance Directive - Living will;Healthcare Power of [place] will [place] will Living will Living will -  Does patient want to make changes to medical advance directive? No - Patient declined No - Patient declined No - Patient declined No - Patient declined No - Patient declined - -  Copy of [place] Chart? No - copy requested No - copy requested No - copy requested Yes - - -  Would patient like information on creating a medical advance directive? - - - - - - No - Patient declined    Tobacco Social History   Tobacco Use  Smoking Status Former Smoker  . Packs/day: 1.00  . Years: 35.00  . Pack years: 35.00  . Types: Cigarettes  . Last attempt to quit: 03/06/1986  . Years since quitting: 32.3  Smokeless Tobacco Former USystems developer . Types: Chew     Counseling given: Not Answered   Clinical Intake:           Diabetes: No  How often do you need to have someone help you when you read instructions, pamphlets, or other written materials from your doctor or pharmacy?: 1 - Never        Past Medical History:  Diagnosis Date  . Allergic rhinitis   . Barrett's esophagus 10/21/13  . [place]  [place]   . Chronic kidney disease    stones  . Colon polyp, hyperplastic   . COPD (chronic obstructive pulmonary disease) (HCC)    mild COPD. former smoker  . Coronary artery disease   . Diverticulosis   . Fundic gland polyps of stomach, benign   . Gastritis   . GERD (gastroesophageal reflux disease)   . Hypercholesteremia   . Hypertension   . Prostate cancer ([place]   . Prostatism   . Reflux esophagitis   . Renal stones   . Skin cancer    left cheek/lesion excised  . Skin cancer   . Sleep apnea   . TIA (transient ischemic attack)   . TIA (transient ischemic attack)    Past Surgical History:  Procedure Laterality Date  . CARDIAC CATHETERIZATION    . COLONOSCOPY WITH PROPOFOL N/A 09/13/2015   Procedure: COLONOSCOPY WITH PROPOFOL;  Surgeon: MLollie Sails MD;  [facility];  Service: Endoscopy;  Laterality: N/A;  . CORONARY ANGIOPLASTY    . ESOPHAGOGASTRODUODENOSCOPY (EGD) WITH PROPOFOL N/A 09/13/2015   Procedure: ESOPHAGOGASTRODUODENOSCOPY (EGD) WITH PROPOFOL;  Surgeon: MLollie Sails MD;  [facility];  Service: Endoscopy;  Laterality: N/A;  . ESOPHAGOGASTRODUODENOSCOPY (EGD) WITH PROPOFOL N/A 12/28/2015   Procedure: ESOPHAGOGASTRODUODENOSCOPY (EGD) WITH PROPOFOL;  Surgeon: MLollie Sails MD;  [facility];  Service: Endoscopy;  Laterality: N/A;  .  ESOPHAGOGASTRODUODENOSCOPY (EGD) WITH PROPOFOL N/A 06/16/2016   Procedure: ESOPHAGOGASTRODUODENOSCOPY (EGD) WITH PROPOFOL;  Surgeon: Lollie Sails, MD;  [facility] ENDOSCOPY;  Service: Endoscopy;  Laterality: N/A;  . EYE SURGERY  2013   CATARACT EXTRACTION  . GANGLION CYST EXCISION Right 05/18/2017   Procedure: REMOVAL GANGLION CYST ANKLE;  Surgeon: Samara Deist, DPM;  Location: [facility] ORS;  Service: Podiatry;  Laterality: Right;  . heart cath stent    . LEFT HEART CATH AND CORONARY ANGIOGRAPHY Right 12/03/2017   Procedure: Left Heart Cath and Coronary Angiography with  possible coronary intervention;  Surgeon: Dionisio David, MD;  Location: [place] CV LAB;  Service: Cardiovascular;  Laterality: Right;  . PROSTATE BIOPSY    . stents     multiple   Family History  Problem Relation Age of Onset  . Cancer Mother        gastric and lung  . Cancer Father        multiple myeloma  . Stroke Father   . Cancer Sister        leukemia  . Cancer Brother        leukemia  . Cancer Brother        kidney  . Cancer Daughter        Uterine  . Cancer Other        Nephes (Sister's Son): Prostate   Social History   Socioeconomic History  . Marital status: Married    Spouse name: Not on file  . Number of children: Not on file  . Years of education: Not on file  . Highest education level: Not on file  Occupational History  . Not on file  Social Needs  . Financial resource strain: Not hard at all  . Food insecurity:    Worry: Never true    Inability: Never true  . Transportation needs:    Medical: No    Non-medical: No  Tobacco Use  . Smoking status: Former Smoker    Packs/day: 1.00    Years: 35.00    Pack years: 35.00    Types: Cigarettes    Last attempt to quit: 03/06/1986    Years since quitting: 32.3  . Smokeless tobacco: Former Systems developer    Types: Chew  Substance and Sexual Activity  . Alcohol use: No  . Drug use: No  . Sexual activity: Not Currently  Lifestyle  . Physical activity:    Days per week: Not on file    Minutes per session: Not on file  . Stress: Not on file  Relationships  . Social connections:    Talks on phone: Not on file    Gets together: Not on file    Attends religious service: Not on file    Active member of club or organization: Not on file    Attends meetings of clubs or organizations: Not on file    Relationship status: Not on file  Other Topics Concern  . Not on file  Social History Narrative  . Not on file    Outpatient Encounter Medications as of 07/15/2018  Medication Sig  . abiraterone acetate  (ZYTIGA) 250 MG tablet TAKE 1 TABLET (250 MG TOTAL) BY MOUTH DAILY.  [name] amiodarone (PACERONE) 400 MG tablet Take 1 tablet (400 mg total) by mouth daily.  [name] amLODipine (NORVASC) 2.5 MG tablet Take 1 tablet (2.5 mg total) by mouth daily.  [name] aspirin EC 81 MG tablet Take 81 mg by mouth daily.  . calcium citrate-vitamin D (  CITRACAL+D) 315-200 MG-UNIT tablet Take 1 tablet by mouth daily.  . cholecalciferol (VITAMIN D) 1000 units tablet Take 1,000 Units by mouth daily.   . clopidogrel (PLAVIX) 75 MG tablet Take 1 tablet (75 mg total) by mouth daily.  . famotidine (PEPCID) 20 MG tablet Take 1 tablet (20 mg total) by mouth 2 (two) times daily.  . fexofenadine (ALLEGRA) 180 MG tablet Take 180 mg by mouth daily as needed for allergies or rhinitis.  . fluticasone (FLONASE) 50 MCG/ACT nasal spray Place 2 sprays into both nostrils daily as needed for allergies.   . hydrochlorothiazide (HYDRODIURIL) 12.5 MG tablet TAKE 1 TABLET BY MOUTH ONCE EVERY MORNING  . Ipratropium-Albuterol (COMBIVENT) 20-100 MCG/ACT AERS respimat 2 puffs qid prn (Patient taking differently: Inhale 2 puffs into the lungs every 6 (six) hours as needed for wheezing. )  . isosorbide mononitrate (IMDUR) 30 MG 24 hr tablet TAKE 1 TABLET BY MOUTH ONCE DAILY  . Leuprolide Acetate, 6 Month, (LUPRON) 45 MG injection Inject 45 mg into the muscle every 6 (six) months.  [name] losartan (COZAAR) 50 MG tablet Take 50 mg by mouth daily.  [name] lovastatin (MEVACOR) 40 MG tablet TAKE 2 TABLETS BY MOUTH AT BEDTIME  . Multiple Vitamin (MULTI-VITAMINS) TABS Take 1 tablet by mouth daily.   . nitroGLYCERIN (NITROSTAT) 0.4 MG SL tablet Place 0.4 mg under the tongue every 5 (five) minutes as needed for chest pain.  . pantoprazole (PROTONIX) 40 MG tablet Take 40 mg by mouth 2 (two) times daily.    No facility-administered encounter medications on file as of 07/15/2018.     Activities of Daily Living In your present state of health, do you have any difficulty performing  the following activities: 07/15/2018 12/03/2017  Hearing? Y N  Comment Hearing aids -  Vision? N N  Difficulty concentrating or making decisions? N N  Walking or climbing stairs? N N  Dressing or bathing? N N  Doing errands, shopping? N -  Preparing Food and eating ? N -  Using the Toilet? N -  In the past six months, have you accidently leaked urine? N -  Do you have problems with loss of bowel control? N -  Managing your Medications? N -  Managing your Finances? N -  Comment Shares responsibilty with wife -  Housekeeping or managing your Housekeeping? N -  Some recent data might be hidden    Patient Care Team: Einar Pheasant, MD as PCP - General (Internal Medicine)   Assessment:   This is a routine wellness examination for Jhace.  I connected with patient and wife 07/15/18 at 10:00 AM EDT by a video/audio enabled telemedicine application and verified that I am speaking with the correct person using two identifiers. Patient stated full name and DOB. Patient gave permission to continue with virtual visit. Patient's location was at home and Nurse's location was at [place] office.   Health Screenings  Colonoscopy - 09/2015 Bone Density - 05/2018 Glaucoma -none Hearing -hearing aids PSA- 06/2018 Cholesterol - 04/2018 (126) Dental- dentures Vision- visits within the last 12 months.  Social  Alcohol intake - no      Smoking history-  former  Smokers in home? none Illicit drug use? none Exercise - walking Diet - regular Sexually Active -not currently BMI- discussed the importance of a healthy diet, water intake and the benefits of aerobic exercise.  Educational material provided.   Safety  Patient feels safe at home- yes Patient does have smoke detectors at home-  yes Patient does wear sunscreen or protective clothing when in direct sunlight -yes Patient does wear seat belt when in a moving vehicle -yes  Covid-19 precautions and sickness symptoms discussed.   Activities of  Daily Living Patient denies needing assistance with: driving, household chores, feeding themselves, getting from bed to chair, getting to the toilet, bathing/showering, dressing, managing money, or preparing meals.  No new identified risk were noted.    Depression Screen Patient denies losing interest in daily life, feeling hopeless, or crying easily over simple problems.   Medication-taking as directed and without issues.   Fall Screen Patient denies being afraid of falling or falling in the last year.   Memory Screen Patient is alert.  Patient denies difficulty focusing, concentrating or misplacing items. Correctly identified the president of the [place] , season and recall. Patient likes to read for brain stimulation.  Immunizations The following Immunizations were discussed: Influenza, shingles, pneumonia, and tetanus.   Other Providers Patient Care Team: Einar Pheasant, MD as PCP - General (Internal Medicine)  Exercise Activities and Dietary recommendations Current Exercise Habits: Home exercise routine, Type of exercise: walking, Time (Minutes): 30, Frequency (Times/Week): 3, Weekly Exercise (Minutes/Week): 90, Intensity: Mild  Goals    . Increase physical activity     Ride stationary bike a little more       Fall Risk Fall Risk  07/11/2017 08/07/2016 04/13/2016 11/15/2015  Falls in the past year? Yes No No No  Number falls in past yr: 1 - - -  Injury with Fall? No - - -  Comment Minor bruising. Observation stay at the hospital. - - -  Follow up Education provided - - -   Depression Screen PHQ 2/9 Scores 07/15/2018 07/11/2017 08/07/2016 04/13/2016  PHQ - 2 Score 0 0 0 0    Cognitive Function     6CIT Screen 07/15/2018 07/11/2017  What Year? 0 points 0 points  What month? 0 points 0 points  What time? 0 points 0 points  Count back from 20 0 points 0 points  Months in reverse 0 points 0 points  Repeat phrase 0 points 0 points  Total Score 0 0    Immunization History   Administered Date(s) Administered  . Influenza, High Dose Seasonal PF 11/28/2016  . Influenza-Unspecified 12/05/2011, 12/05/2015  . Pneumococcal Conjugate-13 07/12/2016  . Pneumococcal Polysaccharide-23 03/06/2006   Screening Tests Health Maintenance  Topic Date Due  . TETANUS/TDAP  12[birth year]03/1954  . INFLUENZA VACCINE  10/05/2018  . PNA vac Low Risk Adult  Completed      Plan:    End of life planning; Advance aging; Advanced directives discussed.  Copy of current HCPOA/Living Will requested.    I have personally reviewed and noted the following in the patient's chart:   . Medical and social history . Use of alcohol, tobacco or illicit drugs  . Current medications and supplements . Functional ability and status . Nutritional status . Physical activity . Advanced directives . List of other physicians . Hospitalizations, surgeries, and ER visits in previous 12 months . Vitals . Screenings to include cognitive, depression, and falls . Referrals and appointments  In addition, I have reviewed and discussed with patient certain preventive protocols, quality metrics, and best practice recommendations. A written personalized care plan for preventive services as well as general preventive health recommendations were provided to patient.     Varney Biles, LPN  1/75/1025   Reviewed above information.  Agree with assessment and plan.    Dr  Scott

## 2018-07-15 NOTE — Patient Instructions (Addendum)
  Nathaniel Thompson , Thank you for taking time to come for your Medicare Wellness Visit. I appreciate your ongoing commitment to your health goals. Please review the following plan we discussed and let me know if I can assist you in the future.   These are the goals we discussed: Goals    . Increase physical activity     Ride stationary bike a little more       This is a list of the screening recommended for you and due dates:  Health Maintenance  Topic Date Due  . Tetanus Vaccine  12[birth year]03/1954  . Flu Shot  10/05/2018  . Pneumonia vaccines  Completed

## 2018-07-15 NOTE — Progress Notes (Signed)
Patient ID: Nathaniel Thompson, male   DOB: [birth year], 83 y.o.   MRN: [id number]   Virtual Visit via video Note  This visit type was conducted due to national recommendations for restrictions regarding the COVID-19 pandemic (e.g. social distancing).  This format is felt to be most appropriate for this patient at this time.  All issues noted in this document were discussed and addressed.  No physical exam was performed (except for noted visual exam findings with Video Visits).   I connected with Ermalinda Memos by a video enabled telemedicine application and verified that I am speaking with the correct person using two identifiers. Location patient: home Location provider: work Persons participating in the virtual visit: patient, provider  I discussed the limitations, risks, security and privacy concerns of performing an evaluation and management service by video and the availability of in person appointments.  The patient expressed understanding and agreed to proceed.   Reason for visit: scheduled follow up.   HPI: Is being followed by Dr Rogue Bussing for follow up of prostate cancer.  On zytiga.  Stable.  Also has a history of afib.  Followed by cardiology.  On amiodarone.  Has had problems with dizziness and dysequilibrium.  Has had extensive cardiology w/up.  Seeing neurology now.  Has 4 hour office EEG tomorrow.  Did improve some with balance therapy.  Still some "episodes".  Discussed the need to continue exercises at home.  No chest pain.  Breathing overall stable.  Tying to get adjusted to cpap.  Seeing pulmonary.  Planning for PFTs.  He is walking and trying to stay active.  No acid reflux.  No abdominal pain.  Bowels moving.  Blood pressure doing well.  Added amlodipine last visit.     ROS: See pertinent positives and negatives per HPI.  Past Medical History:  Diagnosis Date  . Allergic rhinitis   . Barrett's esophagus 10/21/13  . Bone cancer ([place])   . Chronic kidney disease    stones   . Colon polyp, hyperplastic   . COPD (chronic obstructive pulmonary disease) (HCC)    mild COPD. former smoker  . Coronary artery disease   . Diverticulosis   . Fundic gland polyps of stomach, benign   . Gastritis   . GERD (gastroesophageal reflux disease)   . Hypercholesteremia   . Hypertension   . Prostate cancer ([place])   . Prostatism   . Reflux esophagitis   . Renal stones   . Skin cancer    left cheek/lesion excised  . Skin cancer   . Sleep apnea   . TIA (transient ischemic attack)   . TIA (transient ischemic attack)     Past Surgical History:  Procedure Laterality Date  . CARDIAC CATHETERIZATION    . COLONOSCOPY WITH PROPOFOL N/A 09/13/2015   Procedure: COLONOSCOPY WITH PROPOFOL;  Surgeon: Lollie Sails, MD;  [facility] END[facility]COPY;  Service: Endoscopy;  Laterality: N/A;  . CORONARY ANGIOPLASTY    . ESOPHAGOGASTRODUODENOSCOPY (EGD) WITH PROPOFOL N/A 09/13/2015   Procedure: ESOPHAGOGASTRODUODENOSCOPY (EGD) WITH PROPOFOL;  Surgeon: Lollie Sails, MD;  Location: [facility] ENDOSCOPY;  Service: Endoscopy;  Laterality: N/A;  . ESOPHAGOGASTRODUODENOSCOPY (EGD) WITH PROPOFOL N/A 12/28/2015   Procedure: ESOPHAGOGASTRODUODENOSCOPY (EGD) WITH PROPOFOL;  Surgeon: Lollie Sails, MD;  Location: [facility] ENDOSCOPY;  Service: Endoscopy;  Laterality: N/A;  . ESOPHAGOGASTRODUODENOSCOPY (EGD) WITH PROPOFOL N/A 06/16/2016   Procedure: ESOPHAGOGASTRODUODENOSCOPY (EGD) WITH PROPOFOL;  Surgeon: Lollie Sails, MD;  [facility] END[facility];  Service: Endoscopy;  Laterality: N/A;  .  EYE SURGERY  2013   CATARACT EXTRACTION  . GANGLION CYST EXCISION Right 05/18/2017   Procedure: REMOVAL GANGLION CYST ANKLE;  Surgeon: Samara Deist, DPM;  Location: [facility] ORS;  Service: Podiatry;  Laterality: Right;  . heart cath stent    . LEFT HEART CATH AND CORONARY ANGIOGRAPHY Right 12/03/2017   Procedure: Left Heart Cath and Coronary Angiography with possible coronary intervention;  Surgeon: Dionisio David,  MD;  Location: [place] CV LAB;  Service: Cardiovascular;  Laterality: Right;  . PROSTATE BIOPSY    . stents     multiple    Family History  Problem Relation Age of Onset  . Cancer Mother        gastric and lung  . Cancer Father        multiple myeloma  . Stroke Father   . Cancer Sister        leukemia  . Cancer Brother        leukemia  . Cancer Brother        kidney  . Cancer Daughter        Uterine  . Cancer Other        Nephes (Sister's Son): Prostate    SOCIAL HX: reviewed.     Current Outpatient Medications:  .  abiraterone acetate (ZYTIGA) 250 MG tablet, TAKE 1 TABLET (250 MG TOTAL) BY MOUTH DAILY., Disp: 30 tablet, Rfl: 4 .  amiodarone (PACERONE) 400 MG tablet, Take 1 tablet (400 mg total) by mouth daily., Disp: 30 tablet, Rfl: 0 .  amLODipine (NORVASC) 2.5 MG tablet, Take 1 tablet (2.5 mg total) by mouth daily., Disp: 90 tablet, Rfl: 1 .  aspirin EC 81 MG tablet, Take 81 mg by mouth daily., Disp: , Rfl:  .  calcium citrate-vitamin D (CITRACAL+D) 315-200 MG-UNIT tablet, Take 1 tablet by mouth daily., Disp: , Rfl:  .  cholecalciferol (VITAMIN D) 1000 units tablet, Take 1,000 Units by mouth daily. , Disp: , Rfl:  .  clopidogrel (PLAVIX) 75 MG tablet, Take 1 tablet (75 mg total) by mouth daily., Disp: 90 tablet, Rfl: 0 .  famotidine (PEPCID) 20 MG tablet, Take 1 tablet (20 mg total) by mouth 2 (two) times daily., Disp: 180 tablet, Rfl: 1 .  fexofenadine (ALLEGRA) 180 MG tablet, Take 180 mg by mouth daily as needed for allergies or rhinitis., Disp: , Rfl:  .  fluticasone (FLONASE) 50 MCG/ACT nasal spray, Place 2 sprays into both nostrils daily as needed for allergies. , Disp: , Rfl:  .  hydrochlorothiazide (HYDRODIURIL) 12.5 MG tablet, TAKE 1 TABLET BY MOUTH ONCE EVERY MORNING, Disp: 90 tablet, Rfl: 1 .  Ipratropium-Albuterol (COMBIVENT) 20-100 MCG/ACT AERS respimat, 2 puffs qid prn (Patient taking differently: Inhale 2 puffs into the lungs every 6 (six) hours as  needed for wheezing. ), Disp: 1 Inhaler, Rfl: 3 .  isosorbide mononitrate (IMDUR) 30 MG 24 hr tablet, TAKE 1 TABLET BY MOUTH ONCE DAILY, Disp: 90 tablet, Rfl: 2 .  Leuprolide Acetate, 6 Month, (LUPRON) 45 MG injection, Inject 45 mg into the muscle every 6 (six) months., Disp: , Rfl:  .  losartan (COZAAR) 50 MG tablet, Take 50 mg by mouth daily., Disp: , Rfl:  .  lovastatin (MEVACOR) 40 MG tablet, TAKE 2 TABLETS BY MOUTH AT BEDTIME, Disp: 180 tablet, Rfl: 1 .  Multiple Vitamin (MULTI-VITAMINS) TABS, Take 1 tablet by mouth daily. , Disp: , Rfl:  .  nitroGLYCERIN (NITROSTAT) 0.4 MG SL tablet, Place 0.4 mg under the tongue  every 5 (five) minutes as needed for chest pain., Disp: , Rfl:  .  pantoprazole (PROTONIX) 40 MG tablet, Take 40 mg by mouth 2 (two) times daily. , Disp: , Rfl:   EXAM:  VITALS per patient if applicable:  294/76, pulse 73.   GENERAL: alert, oriented, appears well and in no acute distress  HEENT: atraumatic, conjunttiva clear, no obvious abnormalities on inspection of external nose and ears  NECK: normal movements of the head and neck  LUNGS: on inspection no signs of respiratory distress, breathing rate appears normal, no obvious gross SOB, gasping or wheezing  CV: no obvious cyanosis  PSYCH/NEURO: pleasant and cooperative, no obvious depression or anxiety, speech and thought processing grossly intact  ASSESSMENT AND PLAN:  Discussed the following assessment and plan:  Atrial fibrillation, new onset (HCC)  Barrett's esophagus without dysplasia  Coronary artery disease involving native coronary artery of native heart without angina pectoris  Carotid artery disease, unspecified laterality, unspecified type (HCC)  Chronic kidney disease, unspecified CKD stage  Chronic obstructive pulmonary disease, unspecified COPD type ([place])  Hypercholesterolemia - Plan: Hepatic function panel, Lipid panel  Hyperglycemia - Plan: Hemoglobin A1c  Hypertension, essential - Plan:  Basic metabolic panel  Malignant neoplasm of prostate ([place])  Sleep apnea, unspecified type  Dizziness  Atrial fibrillation, new onset ([place]) On amiodarone.  Followed by cardiology.  Stable.    Barrett's esophagus Upper symptoms controlled.  Follow.    CAD (coronary artery disease) Recent cath - no significant CAD.  S/p previous stent placement.  On amiodarone.  Stable.  Continue f/u with cardiology.  Continue risk factor modification.   Carotid artery disease ([place]) Had CTA.  No hemodynamically significant stenosis.    CKD (chronic kidney disease) Stay hydrated.  Avoid antiinflammatories.  Follow metabolic panel.   COPD (chronic obstructive pulmonary disease) (HCC) Followed by pulmonary.    Hypercholesterolemia On lovastatin.  Follow lipid panel and liver function tests.    Hyperglycemia Follow met b and a1c.    Hypertension, essential Blood pressure doing better.  Amlodipine added last visit.  Follow pressures.  Follow metabolic panel.   Malignant neoplasm of prostate ([place]) Currently on zytiga.  Followed by oncology.  Stable.   Sleep apnea Trying to get adjusted to cpap.    Dizziness Has had extensive w/up as outlined.  Currently seeing neurology now.  Planning for office EEG this pm.  Balance therapy helped.  Follow.      I discussed the assessment and treatment plan with the patient. The patient was provided an opportunity to ask questions and all were answered. The patient agreed with the plan and demonstrated an understanding of the instructions.   The patient was advised to call back or seek an in-person evaluation if the symptoms worsen or if the condition fails to improve as anticipated.    Einar Pheasant, MD

## 2018-07-17 ENCOUNTER — Other Ambulatory Visit: Payer: Medicare HMO

## 2018-07-17 ENCOUNTER — Ambulatory Visit: Payer: Medicare HMO

## 2018-07-17 ENCOUNTER — Ambulatory Visit: Payer: Medicare HMO | Admitting: Internal Medicine

## 2018-07-20 ENCOUNTER — Encounter: Payer: Self-pay | Admitting: Internal Medicine

## 2018-07-20 NOTE — Assessment & Plan Note (Signed)
Recent cath - no significant CAD.  S/p previous stent placement.  On amiodarone.  Stable.  Continue f/u with cardiology.  Continue risk factor modification.

## 2018-07-20 NOTE — Assessment & Plan Note (Signed)
Follow met b and a1c.

## 2018-07-20 NOTE — Assessment & Plan Note (Signed)
Blood pressure doing better.  Amlodipine added last visit.  Follow pressures.  Follow metabolic panel.

## 2018-07-20 NOTE — Assessment & Plan Note (Signed)
Had CTA.  No hemodynamically significant stenosis.

## 2018-07-20 NOTE — Assessment & Plan Note (Signed)
Trying to get adjusted to cpap.

## 2018-07-20 NOTE — Assessment & Plan Note (Signed)
On amiodarone.  Followed by cardiology.  Stable.

## 2018-07-20 NOTE — Assessment & Plan Note (Signed)
Stay hydrated.  Avoid antiinflammatories.  Follow metabolic panel.   

## 2018-07-20 NOTE — Assessment & Plan Note (Signed)
Followed by pulmonary 

## 2018-07-20 NOTE — Assessment & Plan Note (Signed)
Upper symptoms controlled.  Follow.  

## 2018-07-20 NOTE — Assessment & Plan Note (Signed)
On lovastatin.  Follow lipid panel and liver function tests.   

## 2018-07-20 NOTE — Assessment & Plan Note (Signed)
Has had extensive w/up as outlined.  Currently seeing neurology now.  Planning for office EEG this pm.  Balance therapy helped.  Follow.

## 2018-07-20 NOTE — Assessment & Plan Note (Signed)
Currently on zytiga.  Followed by oncology.  Stable.

## 2018-07-22 DIAGNOSIS — G4733 Obstructive sleep apnea (adult) (pediatric): Secondary | ICD-10-CM | POA: Diagnosis not present

## 2018-07-25 ENCOUNTER — Ambulatory Visit: Payer: Medicare HMO

## 2018-07-30 ENCOUNTER — Ambulatory Visit: Payer: Medicare HMO | Admitting: Internal Medicine

## 2018-08-14 ENCOUNTER — Ambulatory Visit: Payer: Medicare HMO

## 2018-08-14 DIAGNOSIS — G4733 Obstructive sleep apnea (adult) (pediatric): Secondary | ICD-10-CM | POA: Diagnosis not present

## 2018-08-19 ENCOUNTER — Ambulatory Visit: Payer: Medicare HMO | Admitting: Internal Medicine

## 2018-08-19 MED FILL — ABIRATERONE ACETATE 250 MG: 250 | 30 days supply | Qty: 30 | Fill #3

## 2018-09-04 ENCOUNTER — Other Ambulatory Visit: Payer: Self-pay

## 2018-09-09 DIAGNOSIS — G459 Transient cerebral ischemic attack, unspecified: Secondary | ICD-10-CM | POA: Diagnosis not present

## 2018-09-09 DIAGNOSIS — R42 Dizziness and giddiness: Secondary | ICD-10-CM | POA: Diagnosis not present

## 2018-09-09 DIAGNOSIS — R569 Unspecified convulsions: Secondary | ICD-10-CM | POA: Diagnosis not present

## 2018-09-09 DIAGNOSIS — R55 Syncope and collapse: Secondary | ICD-10-CM | POA: Diagnosis not present

## 2018-09-10 ENCOUNTER — Other Ambulatory Visit: Payer: Self-pay | Admitting: Neurology

## 2018-09-10 DIAGNOSIS — R42 Dizziness and giddiness: Secondary | ICD-10-CM

## 2018-09-12 ENCOUNTER — Other Ambulatory Visit: Payer: Self-pay

## 2018-09-12 ENCOUNTER — Telehealth: Payer: Self-pay | Admitting: Pharmacy Technician

## 2018-09-12 ENCOUNTER — Other Ambulatory Visit
Admission: RE | Admit: 2018-09-12 | Discharge: 2018-09-12 | Disposition: A | Discharge status: Home / Self Care | Payer: Medicare HMO | Source: Ambulatory Visit | Attending: Internal Medicine | Admitting: Internal Medicine

## 2018-09-12 DIAGNOSIS — Z1159 Encounter for screening for other viral diseases: Secondary | ICD-10-CM | POA: Insufficient documentation

## 2018-09-12 DIAGNOSIS — Z01812 Encounter for preprocedural laboratory examination: Secondary | ICD-10-CM | POA: Diagnosis not present

## 2018-09-12 NOTE — Telephone Encounter (Signed)
Oral Oncology Patient Advocate Encounter   Was successful in securing patient a $7500 grant from [employer] to provide copayment coverage for Zytiga.  This will keep the out of pocket expense at $0.    The billing information is as follows and has been shared with [place].   Member ID: [id number] Group ID: [facility] RxBin: [id number] PCN: [id number] Dates of Eligibility: 09/12/2018 through 09/12/2019  [place] Patient [facility] Phone [phone] Fax [phone] 09/12/2018 11:43 AM

## 2018-09-13 DIAGNOSIS — I10 Essential (primary) hypertension: Secondary | ICD-10-CM | POA: Diagnosis not present

## 2018-09-13 DIAGNOSIS — G4733 Obstructive sleep apnea (adult) (pediatric): Secondary | ICD-10-CM | POA: Diagnosis not present

## 2018-09-13 DIAGNOSIS — G4730 Sleep apnea, unspecified: Secondary | ICD-10-CM | POA: Diagnosis not present

## 2018-09-13 DIAGNOSIS — I739 Peripheral vascular disease, unspecified: Secondary | ICD-10-CM | POA: Diagnosis not present

## 2018-09-13 DIAGNOSIS — I2510 Atherosclerotic heart disease of native coronary artery without angina pectoris: Secondary | ICD-10-CM | POA: Diagnosis not present

## 2018-09-13 LAB — SARS CORONAVIRUS 2 (TAT 6-24 HRS): SARS Coronavirus 2: NEGATIVE

## 2018-09-16 ENCOUNTER — Other Ambulatory Visit: Payer: Self-pay

## 2018-09-16 ENCOUNTER — Ambulatory Visit: Payer: Medicare HMO | Attending: Internal Medicine

## 2018-09-16 DIAGNOSIS — G4733 Obstructive sleep apnea (adult) (pediatric): Secondary | ICD-10-CM | POA: Diagnosis not present

## 2018-09-16 DIAGNOSIS — J449 Chronic obstructive pulmonary disease, unspecified: Secondary | ICD-10-CM | POA: Diagnosis not present

## 2018-09-16 DIAGNOSIS — R0609 Other forms of dyspnea: Secondary | ICD-10-CM | POA: Diagnosis not present

## 2018-09-16 DIAGNOSIS — J439 Emphysema, unspecified: Secondary | ICD-10-CM

## 2018-09-16 MED ORDER — ALBUTEROL SULFATE (2.5 MG/3ML) 0.083% IN NEBU
2.5000 mg | INHALATION_SOLUTION | Freq: Once | RESPIRATORY_TRACT | Status: AC
  Administered 2018-09-16: 08:00:00 2.5 mg via RESPIRATORY_TRACT
  Filled 2018-09-16: qty 3

## 2018-09-17 ENCOUNTER — Telehealth: Payer: Self-pay

## 2018-09-17 NOTE — Telephone Encounter (Signed)
Called patient for COVID-19 pre-screening for in office visit.  Have you recently traveled any where out of the local area in the last 2 weeks? no  Have you been in close contact with a person diagnosed with COVID-19 or someone awaiting results within the last 2 weeks? no  Do you currently have any of the following symptoms? If so, when did they start? Cough     Diarrhea   Joint Pain-RA Fever      Muscle Pain   Red eyes Shortness of breath- present for years   Abdominal pain  Vomiting Loss of smell    Rash    Sore Throat Headache    Weakness   Bruising or bleeding   Okay to proceed with visit.

## 2018-09-18 NOTE — Progress Notes (Signed)
[facility] Consultation      Assessment and Plan:  Obstructive sleep apnea. - He got a new mask from DME, and is tolerating CPAP much better since last visit. -Continue CPAP AutoPap range to 10-20 cm H2O. - Currently having some issues with mask leak causing discomfort.  Offered to refer the patient to mask fitting clinic but he has not wanted to go up to [place].  Asked that he call his DME supplier for a new mask.  Atrial fibrillation, TIA, essential hypertension. - Above conditions can be contributed to by obstructive sleep apnea, therefore treatment of sleep apnea is important part of their management.  Dyspnea on exertion. - Continue dyspnea on exertion, cardiac and COPD work-up are negative. - I suspect that the patient's dyspnea on exertion is likely multifactorial, and is likely greatly contributed to by deconditioning/debility.  I walked him in the office today, he appears to try to walk at a very fast pace with a limp.  We discussed trying to slow his pace down.  I discussed with him and his wife that they should try to walk daily for 20 to 30 minutes at a slow to moderate pace that can be comfortably sustained.  Chronic bronchitis. - PFT reviewed today, there is not appear to be significant COPD/emphysema.  Patient is having minimal improvement with Combivent, I have asked him that he can stop it if it is not helping him since I think respiratory issues are contributing minimally to his dyspnea.  Return in about 6 months (around 03/22/2019).   Date: 09/18/2018  MRN# [id number] Nathaniel Thompson Jun 04, 1935    Nathaniel Thompson is a 83 y.o. old male seen in consultation for chief complaint of:    Chief Complaint  Patient presents with  . Follow-up    OSA-Feels like CPAP is working well for him.C/O SOB on exertion  that has not worsened since last visit. C/O productive cough x2weeks greenish mucus at times.    HPI:   The patient is an 83 year old male  with history of obstructive sleep apnea, atrial fibrillation.  At last visit he was having dyspnea and a lot of trouble with tolerating CPAP due to mask leaks.  He was sent to his DME company and got a new mask.He continues to use CPAP every night, he is doing much better with the new mask.   Since his last visit he feels that his breathing continues to be short with activity such as walking in from the parking lot.  He takes combivent twice in the past week, he is not sure that it is doing anything for him.  He has been seen by cardiology and he understands that his heart is stable. He smoked until 1989, smoked ppd until that time. He has never been diagnosed with COPD, he has seen Dr. Raul Del once several years ago and told that his breathing was doing well.   **Desat walk 09/19/18>> at rest on RA, sat was 96% and HR 55, walked 360 feet at fast pace with sat 96% and HR 85. Moderate dyspnea.  **CPAP download 07/10/2018-08/08/2018>> uses greater than 4 hours is 28/30 days.  Average usage on days used is 5 hours 36 minutes.  Pressure ranges 10-20.  Median pressure 12, 95th percentile pressure 15, maximum pressure 17.  Leaks are within normal limits.  Residual AHI is 4.9.  Overall this shows very good compliance with with CPAP and excellent control obstructive sleep apnea. **PFT 09/16/2018>> tracings personally reviewed.  FVC is 98% predicted, FEV1 is 104% predicted, ratio is 76%.  There is no significant improvement with bronchodilator.  TLC is 94% predicted.  Ratio is within normal limits.  Flow volume loop is unremarkable.  DLCO 62% predicted.  Overall this shows normal pulmonary function without evidence of significant COPD/emphysema. **Chest x-ray 04/30/2018>> imaging personally reviewed, mild changes of chronic bronchitis, lungs are otherwise unremarkable.  Nodule in the right upper lung zone. **CPAP download 03/31/2018-04/29/2018>> raw data personally reviewed.  Usage greater than 4 hours is 20/30 days.   Average usage on days used is 5 hours 11 minutes.  Pressure ranges 5-20.  Median pressure 13, 95th percentile pressure 16, max pressure 17.5.  Leaks are within normal limits.  Residual AHI is 4.  Overall this shows inadequate compliance with CPAP with good control of obstructive sleep apnea when used. **Left heart cath 12/03/2017>> mild stable coronary artery disease, **CPAP titration 12/24/2009>> No significant CAD with normal LVEF. **Review of old records, previous sleep study 12/02/2009.  AHI of 20, consistent with moderate obstructive sleep apnea.  CPAP was recommended.  Medication:    Current Outpatient Medications:  .  abiraterone acetate (ZYTIGA) 250 MG tablet, TAKE 1 TABLET (250 MG TOTAL) BY MOUTH DAILY., Disp: 30 tablet, Rfl: 4 .  amiodarone (PACERONE) 400 MG tablet, Take 1 tablet (400 mg total) by mouth daily., Disp: 30 tablet, Rfl: 0 .  amLODipine (NORVASC) 2.5 MG tablet, Take 1 tablet (2.5 mg total) by mouth daily., Disp: 90 tablet, Rfl: 1 .  aspirin EC 81 MG tablet, Take 81 mg by mouth daily., Disp: , Rfl:  .  calcium citrate-vitamin D (CITRACAL+D) 315-200 MG-UNIT tablet, Take 1 tablet by mouth daily., Disp: , Rfl:  .  cholecalciferol (VITAMIN D) 1000 units tablet, Take 1,000 Units by mouth daily. , Disp: , Rfl:  .  clopidogrel (PLAVIX) 75 MG tablet, Take 1 tablet (75 mg total) by mouth daily., Disp: 90 tablet, Rfl: 0 .  famotidine (PEPCID) 20 MG tablet, Take 1 tablet (20 mg total) by mouth 2 (two) times daily., Disp: 180 tablet, Rfl: 1 .  fexofenadine (ALLEGRA) 180 MG tablet, Take 180 mg by mouth daily as needed for allergies or rhinitis., Disp: , Rfl:  .  fluticasone (FLONASE) 50 MCG/ACT nasal spray, Place 2 sprays into both nostrils daily as needed for allergies. , Disp: , Rfl:  .  hydrochlorothiazide (HYDRODIURIL) 12.5 MG tablet, TAKE 1 TABLET BY MOUTH ONCE EVERY MORNING, Disp: 90 tablet, Rfl: 1 .  Ipratropium-Albuterol (COMBIVENT) 20-100 MCG/ACT AERS respimat, 2 puffs qid prn  (Patient taking differently: Inhale 2 puffs into the lungs every 6 (six) hours as needed for wheezing. ), Disp: 1 Inhaler, Rfl: 3 .  isosorbide mononitrate (IMDUR) 30 MG 24 hr tablet, TAKE 1 TABLET BY MOUTH ONCE DAILY, Disp: 90 tablet, Rfl: 2 .  Leuprolide Acetate, 6 Month, (LUPRON) 45 MG injection, Inject 45 mg into the muscle every 6 (six) months., Disp: , Rfl:  .  losartan (COZAAR) 50 MG tablet, Take 50 mg by mouth daily., Disp: , Rfl:  .  lovastatin (MEVACOR) 40 MG tablet, TAKE 2 TABLETS BY MOUTH AT BEDTIME, Disp: 180 tablet, Rfl: 1 .  Multiple Vitamin (MULTI-VITAMINS) TABS, Take 1 tablet by mouth daily. , Disp: , Rfl:  .  nitroGLYCERIN (NITROSTAT) 0.4 MG SL tablet, Place 0.4 mg under the tongue every 5 (five) minutes as needed for chest pain., Disp: , Rfl:  .  pantoprazole (PROTONIX) 40 MG tablet, Take 40 mg by mouth  2 (two) times daily. , Disp: , Rfl:    Allergies:  Atorvastatin      LABORATORY PANEL:   CBC No results for input(s): WBC, HGB, HCT, PLT in the last 168 hours. ------------------------------------------------------------------------------------------------------------------  Chemistries  No results for input(s): NA, K, CL, CO2, GLUCOSE, BUN, CREATININE, CALCIUM, MG, AST, ALT, ALKPHOS, BILITOT in the last 168 hours.  Invalid input(s): GFRCGP ------------------------------------------------------------------------------------------------------------------  Cardiac Enzymes No results for input(s): TROPONINI in the last 168 hours. ------------------------------------------------------------  RADIOLOGY:  No results found.     Thank  you for the consultation and for allowing [facility] to assist in the care of your patient. Our recommendations are noted above.  Please contact us if we can be of further service.   Marda Stalker, M.D., F.C.C.P.  Board Certified in Internal Medicine, Pulmonary Medicine, [place], and  Sleep Medicine.  [facility] Office Number: [phone]   09/18/2018

## 2018-09-19 ENCOUNTER — Ambulatory Visit: Payer: Medicare HMO | Admitting: Internal Medicine

## 2018-09-19 ENCOUNTER — Encounter: Payer: Self-pay | Admitting: Internal Medicine

## 2018-09-19 ENCOUNTER — Other Ambulatory Visit: Payer: Self-pay

## 2018-09-19 VITALS — BP 160/84 | HR 62 | Temp 97.5°F | Ht 70.0 in | Wt 209.0 lb

## 2018-09-19 DIAGNOSIS — R0609 Other forms of dyspnea: Secondary | ICD-10-CM | POA: Diagnosis not present

## 2018-09-19 DIAGNOSIS — G4733 Obstructive sleep apnea (adult) (pediatric): Secondary | ICD-10-CM | POA: Diagnosis not present

## 2018-09-19 NOTE — Patient Instructions (Signed)
Start walking 20 to 30 minutes per day at a slow pace.

## 2018-09-23 ENCOUNTER — Other Ambulatory Visit: Payer: Self-pay

## 2018-09-23 ENCOUNTER — Ambulatory Visit
Admission: RE | Admit: 2018-09-23 | Discharge: 2018-09-23 | Disposition: A | Discharge status: Home / Self Care | Payer: Medicare HMO | Source: Ambulatory Visit | Attending: Neurology | Admitting: Neurology

## 2018-09-23 DIAGNOSIS — R42 Dizziness and giddiness: Secondary | ICD-10-CM | POA: Diagnosis not present

## 2018-09-23 LAB — POCT I-STAT CREATININE: Creatinine, Ser: 1.2 mg/dL (ref 0.61–1.24)

## 2018-09-23 MED ORDER — GADOBUTROL 1 MMOL/ML IV SOLN
10.0000 mL | Freq: Once | INTRAVENOUS | Status: AC | PRN
  Administered 2018-09-23: 10 mL via INTRAVENOUS

## 2018-09-23 MED FILL — ABIRATERONE ACETATE 250 MG: 250 | 30 days supply | Qty: 30 | Fill #4

## 2018-09-25 ENCOUNTER — Inpatient Hospital Stay: Payer: Medicare HMO | Attending: Internal Medicine

## 2018-09-25 ENCOUNTER — Other Ambulatory Visit: Payer: Self-pay

## 2018-09-25 ENCOUNTER — Other Ambulatory Visit: Payer: Self-pay | Admitting: *Deleted

## 2018-09-25 ENCOUNTER — Inpatient Hospital Stay: Payer: Medicare HMO | Admitting: Internal Medicine

## 2018-09-25 ENCOUNTER — Inpatient Hospital Stay: Payer: Medicare HMO

## 2018-09-25 DIAGNOSIS — N189 Chronic kidney disease, unspecified: Secondary | ICD-10-CM

## 2018-09-25 DIAGNOSIS — J449 Chronic obstructive pulmonary disease, unspecified: Secondary | ICD-10-CM | POA: Insufficient documentation

## 2018-09-25 DIAGNOSIS — C7951 Secondary malignant neoplasm of bone: Secondary | ICD-10-CM | POA: Diagnosis not present

## 2018-09-25 DIAGNOSIS — Z8673 Personal history of transient ischemic attack (TIA), and cerebral infarction without residual deficits: Secondary | ICD-10-CM | POA: Insufficient documentation

## 2018-09-25 DIAGNOSIS — Z87891 Personal history of nicotine dependence: Secondary | ICD-10-CM | POA: Insufficient documentation

## 2018-09-25 DIAGNOSIS — Z5111 Encounter for antineoplastic chemotherapy: Secondary | ICD-10-CM | POA: Diagnosis present

## 2018-09-25 DIAGNOSIS — C775 Secondary and unspecified malignant neoplasm of intrapelvic lymph nodes: Secondary | ICD-10-CM

## 2018-09-25 DIAGNOSIS — C61 Malignant neoplasm of prostate: Secondary | ICD-10-CM

## 2018-09-25 DIAGNOSIS — I129 Hypertensive chronic kidney disease with stage 1 through stage 4 chronic kidney disease, or unspecified chronic kidney disease: Secondary | ICD-10-CM | POA: Diagnosis not present

## 2018-09-25 LAB — CBC WITH DIFFERENTIAL/PLATELET
Abs Immature Granulocytes: 0.01 10*3/uL (ref 0.00–0.07)
Basophils Absolute: 0 10*3/uL (ref 0.0–0.1)
Basophils Relative: 1 %
Eosinophils Absolute: 0.2 10*3/uL (ref 0.0–0.5)
Eosinophils Relative: 4 %
HCT: 33.6 % — ABNORMAL LOW (ref 39.0–52.0)
Hemoglobin: 11.2 g/dL — ABNORMAL LOW (ref 13.0–17.0)
Immature Granulocytes: 0 %
Lymphocytes Relative: 28 %
Lymphs Abs: 1.3 10*3/uL (ref 0.7–4.0)
MCH: 28.6 pg (ref 26.0–34.0)
MCHC: 33.3 g/dL (ref 30.0–36.0)
MCV: 85.7 fL (ref 80.0–100.0)
Monocytes Absolute: 0.6 10*3/uL (ref 0.1–1.0)
Monocytes Relative: 13 %
Neutro Abs: 2.5 10*3/uL (ref 1.7–7.7)
Neutrophils Relative %: 54 %
Platelets: 199 10*3/uL (ref 150–400)
RBC: 3.92 MIL/uL — ABNORMAL LOW (ref 4.22–5.81)
RDW: 13.1 % (ref 11.5–15.5)
WBC: 4.6 10*3/uL (ref 4.0–10.5)
nRBC: 0 % (ref 0.0–0.2)

## 2018-09-25 LAB — COMPREHENSIVE METABOLIC PANEL
ALT: 15 U/L (ref 0–44)
AST: 22 U/L (ref 15–41)
Albumin: 3.7 g/dL (ref 3.5–5.0)
Alkaline Phosphatase: 62 U/L (ref 38–126)
Anion gap: 7 (ref 5–15)
BUN: 18 mg/dL (ref 8–23)
CO2: 26 mmol/L (ref 22–32)
Calcium: 9.2 mg/dL (ref 8.9–10.3)
Chloride: 107 mmol/L (ref 98–111)
Creatinine, Ser: 1.18 mg/dL (ref 0.61–1.24)
GFR calc Af Amer: >60 mL/min (ref >60)
GFR calc non Af Amer: 58 mL/min — ABNORMAL LOW (ref >60)
Glucose, Bld: 99 mg/dL (ref 70–99)
Potassium: 3.7 mmol/L (ref 3.5–5.1)
Sodium: 140 mmol/L (ref 135–145)
Total Bilirubin: 0.7 mg/dL (ref 0.3–1.2)
Total Protein: 6.8 g/dL (ref 6.5–8.1)

## 2018-09-25 LAB — PSA: Prostatic Specific Antigen: 0.01 ng/mL (ref 0.00–4.00)

## 2018-09-25 MED ORDER — LEUPROLIDE ACETATE (3 MONTH) 22.5 MG ~~LOC~~ KIT
22.5000 mg | PACK | Freq: Once | SUBCUTANEOUS | Status: AC
  Administered 2018-09-25: 22.5 mg via SUBCUTANEOUS
  Filled 2018-09-25: qty 22.5

## 2018-09-25 NOTE — Progress Notes (Signed)
[facility] OFFICE PROGRESS NOTE  Patient Care Team: Einar Pheasant, MD as PCP - General (Internal Medicine)  Cancer Staging No matching staging information was found for the patient.   Oncology History Overview Note  Nov 2017- completed IM RT radiation therapy to his prostate and pelvic nodes for Gleason 7 (4+3) adenocarcinoma the prostate presenting the PSA of 7.8.  # JAN 2019- METASTATIC PROSTATE CA to Bone [low volume met on bone scan; CT- NED]; PSA ~50; March 25th 2019- Lupron 45 mg IM [Urology q 16M]; June 26, 2018-Eligard every 3 months[intolerance to Lupron/question A. Fib/injection site pain  # May 23rd Zytiga 220m/day; no prednisone   # OBenita Stabile [place]]; Oct 2019- A.fib/not on eliquis; Dr.Khan/[facility].   ------------------------------------------------------------------  DIAGNOSIS: [ JAN 2019]- Met- PROSTATE CANCER  STAGE:  IV       ;GOALS: PALLIATIVE  CURRENT/MOST RECENT THERAPY [ march 2019]- Lupron; May 23rd 2019- Zytiga 2565mday    Malignant neoplasm of prostate ([facility]     INTERVAL HISTORY:  [age]FAN VEAL56.o.  male pleasant patient above history of metastatic prostate cancer on ADT/Zytiga is here for follow-up.  Patient continues to have intermittent dizzy episodes.  No syncopal episodes.  He follows up with Dr. Shah/neurology.  Had MRI of the brain yesterday.  No nausea no vomiting.  Appetite is good.  He is trying to lose weight.  No swelling in the legs.  Review of Systems  Constitutional: Negative for chills, diaphoresis, fever, malaise/fatigue and weight loss.  HENT: Negative for nosebleeds and sore throat.   Eyes: Negative for double vision.  Respiratory: Negative for cough, hemoptysis, sputum production, shortness of breath and wheezing.   Cardiovascular: Negative for chest pain, palpitations, orthopnea and leg swelling.  Gastrointestinal: Negative for abdominal pain, blood in stool, constipation, diarrhea,  heartburn, melena, nausea and vomiting.  Genitourinary: Negative for dysuria, frequency and urgency.  Musculoskeletal: Negative for back pain and joint pain.  Skin: Negative.  Negative for itching and rash.  Neurological: Positive for dizziness. Negative for tingling, focal weakness, weakness and headaches.  Endo/Heme/Allergies: Does not bruise/bleed easily.  Psychiatric/Behavioral: Negative for depression. The patient is not nervous/anxious and does not have insomnia.       PAST MEDICAL HISTORY :  Past Medical History:  Diagnosis Date  . Allergic rhinitis   . Barrett's esophagus 10/21/13  . Bone cancer ([facility]  . Chronic kidney disease    stones  . Colon polyp, hyperplastic   . COPD (chronic obstructive pulmonary disease) (HCC)    mild COPD. former smoker  . Coronary artery disease   . Diverticulosis   . Fundic gland polyps of stomach, benign   . Gastritis   . GERD (gastroesophageal reflux disease)   . Hypercholesteremia   . Hypertension   . Prostate cancer ([facility]  . Prostatism   . Reflux esophagitis   . Renal stones   . Skin cancer    left cheek/lesion excised  . Skin cancer   . Sleep apnea   . TIA (transient ischemic attack)   . TIA (transient ischemic attack)     PAST SURGICAL HISTORY :   Past Surgical History:  Procedure Laterality Date  . CARDIAC CATHETERIZATION    . COLONOSCOPY WITH PROPOFOL N/A 09/13/2015   Procedure: COLONOSCOPY WITH PROPOFOL;  Surgeon: MaLollie SailsMD;  [facility];  Service: Endoscopy;  Laterality: N/A;  . CORONARY ANGIOPLASTY    . ESOPHAGOGASTRODUODENOSCOPY (EGD) WITH PROPOFOL N/A 09/13/2015  Procedure: ESOPHAGOGASTRODUODENOSCOPY (EGD) WITH PROPOFOL;  Surgeon: Lollie Sails, MD;  Location: [facility] ENDOSCOPY;  Service: Endoscopy;  Laterality: N/A;  . ESOPHAGOGASTRODUODENOSCOPY (EGD) WITH PROPOFOL N/A 12/28/2015   Procedure: ESOPHAGOGASTRODUODENOSCOPY (EGD) WITH PROPOFOL;  Surgeon: Lollie Sails, MD;  [facility]  ENDOSCOPY;  Service: Endoscopy;  Laterality: N/A;  . ESOPHAGOGASTRODUODENOSCOPY (EGD) WITH PROPOFOL N/A 06/16/2016   Procedure: ESOPHAGOGASTRODUODENOSCOPY (EGD) WITH PROPOFOL;  Surgeon: Lollie Sails, MD;  Location: [facility] ENDOSCOPY;  Service: Endoscopy;  Laterality: N/A;  . EYE SURGERY  2013   CATARACT EXTRACTION  . GANGLION CYST EXCISION Right 05/18/2017   Procedure: REMOVAL GANGLION CYST ANKLE;  Surgeon: Samara Deist, DPM;  Location: [facility] ORS;  Service: Podiatry;  Laterality: Right;  . heart cath stent    . LEFT HEART CATH AND CORONARY ANGIOGRAPHY Right 12/03/2017   Procedure: Left Heart Cath and Coronary Angiography with possible coronary intervention;  Surgeon: Dionisio David, MD;  Location: [facility] CV LAB;  Service: Cardiovascular;  Laterality: Right;  . PROSTATE BIOPSY    . stents     multiple    FAMILY HISTORY :   Family History  Problem Relation Age of Onset  . Cancer Mother        gastric and lung  . Cancer Father        multiple myeloma  . Stroke Father   . Cancer Sister        leukemia  . Cancer Brother        leukemia  . Cancer Brother        kidney  . Cancer Daughter        Uterine  . Cancer Other        [place] (Sister's Son): Prostate    SOCIAL HISTORY:   Social History   Tobacco Use  . Smoking status: Former Smoker    Packs/day: 1.00    Years: 35.00    Pack years: 35.00    Types: Cigarettes    Quit date: 03/06/1986    Years since quitting: 32.5  . Smokeless tobacco: Former Systems developer    Types: Chew  Substance Use Topics  . Alcohol use: No  . Drug use: No    ALLERGIES:  is allergic to atorvastatin.  MEDICATIONS:  Current Outpatient Medications  Medication Sig Dispense Refill  . abiraterone acetate (ZYTIGA) 250 MG tablet TAKE 1 TABLET (250 MG TOTAL) BY MOUTH DAILY. 30 tablet 4  . amLODipine (NORVASC) 2.5 MG tablet Take 1 tablet (2.5 mg total) by mouth daily. 90 tablet 1  . aspirin EC 81 MG tablet Take 81 mg by mouth daily.    . calcium  citrate-vitamin D (CITRACAL+D) 315-200 MG-UNIT tablet Take 1 tablet by mouth daily.    . cholecalciferol (VITAMIN D) 1000 units tablet Take 1,000 Units by mouth daily.     . clopidogrel (PLAVIX) 75 MG tablet Take 1 tablet (75 mg total) by mouth daily. 90 tablet 0  . famotidine (PEPCID) 20 MG tablet Take 1 tablet (20 mg total) by mouth 2 (two) times daily. 180 tablet 1  . fexofenadine (ALLEGRA) 180 MG tablet Take 180 mg by mouth daily as needed for allergies or rhinitis.    . fluticasone (FLONASE) 50 MCG/ACT nasal spray Place 2 sprays into both nostrils daily as needed for allergies.     . hydrochlorothiazide (HYDRODIURIL) 12.5 MG tablet TAKE 1 TABLET BY MOUTH ONCE EVERY MORNING 90 tablet 1  . Ipratropium-Albuterol (COMBIVENT) 20-100 MCG/ACT AERS respimat 2 puffs qid prn (Patient taking differently:  Inhale 2 puffs into the lungs every 6 (six) hours as needed for wheezing. ) 1 Inhaler 3  . isosorbide mononitrate (IMDUR) 30 MG 24 hr tablet TAKE 1 TABLET BY MOUTH ONCE DAILY 90 tablet 2  . losartan (COZAAR) 50 MG tablet Take 50 mg by mouth daily.    [name] lovastatin (MEVACOR) 40 MG tablet TAKE 2 TABLETS BY MOUTH AT BEDTIME 180 tablet 1  . Multiple Vitamin (MULTI-VITAMINS) TABS Take 1 tablet by mouth daily.     . nitroGLYCERIN (NITROSTAT) 0.4 MG SL tablet Place 0.4 mg under the tongue every 5 (five) minutes as needed for chest pain.    . pantoprazole (PROTONIX) 40 MG tablet Take 40 mg by mouth 2 (two) times daily.     [name] amiodarone (PACERONE) 400 MG tablet Take 1 tablet (400 mg total) by mouth daily. 30 tablet 0   No current facility-administered medications for this visit.     PHYSICAL EXAMINATION: ECOG PERFORMANCE STATUS: 0 - Asymptomatic  BP (!) 164/88   Pulse (!) 54   Temp (!) 95.9 F (35.5 C)   Resp 18   Wt 208 lb 12.8 oz (94.7 kg)   BMI 29.96 kg/m   Filed Weights   09/25/18 0940  Weight: 208 lb 12.8 oz (94.7 kg)    Physical Exam  Constitutional: He is oriented to person, place, and  time and well-developed, well-nourished, and in no distress.  He is alone.  Is walking himself.  HENT:  Head: Normocephalic and atraumatic.  Mouth/Throat: Oropharynx is clear and moist. No oropharyngeal exudate.  Eyes: Pupils are equal, round, and reactive to light.  Neck: Normal range of motion. Neck supple.  Cardiovascular: Normal rate and regular rhythm.  Pulmonary/Chest: No respiratory distress. He has no wheezes.  Abdominal: Soft. Bowel sounds are normal. He exhibits no distension and no mass. There is no abdominal tenderness. There is no rebound and no guarding.  Musculoskeletal: Normal range of motion.        General: No tenderness or edema.  Neurological: He is alert and oriented to person, place, and time.  Skin: Skin is warm.  Psychiatric: Affect normal.      LABORATORY DATA:  I have reviewed the data as listed    Component Value Date/Time   NA 140 09/25/2018 0919   NA 140 09/16/2013 0438   K 3.7 09/25/2018 0919   K 4.2 09/16/2013 0438   CL 107 09/25/2018 0919   CL 109 (H) 09/16/2013 0438   CO2 26 09/25/2018 0919   CO2 24 09/16/2013 0438   GLUCOSE 99 09/25/2018 0919   GLUCOSE 101 (H) 09/16/2013 0438   BUN 18 09/25/2018 0919   BUN 20 (H) 09/16/2013 0438   CREATININE 1.18 09/25/2018 0919   CREATININE 1.28 09/16/2013 0438   CALCIUM 9.2 09/25/2018 0919   CALCIUM 8.3 (L) 09/16/2013 0438   PROT 6.8 09/25/2018 0919   PROT 6.7 09/15/2013 0755   ALBUMIN 3.7 09/25/2018 0919   ALBUMIN 3.5 09/15/2013 0755   AST 22 09/25/2018 0919   AST 37 09/15/2013 0755   ALT 15 09/25/2018 0919   ALT 28 09/15/2013 0755   ALKPHOS 62 09/25/2018 0919   ALKPHOS 44 (L) 09/15/2013 0755   BILITOT 0.7 09/25/2018 0919   BILITOT 0.5 09/15/2013 0755   GFRNONAA 58 (L) 09/25/2018 0919   GFRNONAA 54 (L) 09/16/2013 0438   GFRAA >60 09/25/2018 0919   GFRAA >60 09/16/2013 0438    No results found for: SPEP, UPEP  Lab Results  Component Value Date   WBC 4.6 09/25/2018   NEUTROABS 2.5  09/25/2018   HGB 11.2 (L) 09/25/2018   HCT 33.6 (L) 09/25/2018   MCV 85.7 09/25/2018   PLT 199 09/25/2018      Chemistry      Component Value Date/Time   NA 140 09/25/2018 0919   NA 140 09/16/2013 0438   K 3.7 09/25/2018 0919   K 4.2 09/16/2013 0438   CL 107 09/25/2018 0919   CL 109 (H) 09/16/2013 0438   CO2 26 09/25/2018 0919   CO2 24 09/16/2013 0438   BUN 18 09/25/2018 0919   BUN 20 (H) 09/16/2013 0438   CREATININE 1.18 09/25/2018 0919   CREATININE 1.28 09/16/2013 0438      Component Value Date/Time   CALCIUM 9.2 09/25/2018 0919   CALCIUM 8.3 (L) 09/16/2013 0438   ALKPHOS 62 09/25/2018 0919   ALKPHOS 44 (L) 09/15/2013 0755   AST 22 09/25/2018 0919   AST 37 09/15/2013 0755   ALT 15 09/25/2018 0919   ALT 28 09/15/2013 0755   BILITOT 0.7 09/25/2018 0919   BILITOT 0.5 09/15/2013 0755       RADIOGRAPHIC STUDIES: I have personally reviewed the radiological images as listed and agreed with the findings in the report. Mr Jeri Cos Wo Contrast  Result Date: 09/24/2018 CLINICAL DATA:  Dizziness. Additional history provided: Dizzy episodes, patient reports history of stroke and prostate cancer EXAM: MRI HEAD WITHOUT AND WITH CONTRAST TECHNIQUE: Multiplanar, multiecho pulse sequences of the brain and surrounding structures were obtained without and with intravenous contrast. CONTRAST:  10 mL intravenous Gadavist COMPARISON:  Brain MRI 09/19/2017, brain MRI 12/25/2009, FINDINGS: Brain: There is no acute infarct, intracranial hemorrhage, intracranial mass, midline shift or extra-axial collection. Redemonstrated small chronic focus of cortical encephalomalacia in the anterior inferolateral right frontal lobe (series 10, image 14). Unchanged nonspecific punctate focus of T2/FLAIR hyperintensity within the left frontal lobe white matter. No new parenchymal signal abnormality. Cerebral volume is age appropriate and unchanged. Incidentally noted small right cerebellar developmental venous  anomaly (series 18, image 37). No other abnormal intracranial enhancement. Vascular: Normal flow voids within the proximal large vessels. Skull and upper cervical spine: Normal marrow signal. Sinuses/Orbits: Bilateral lens replacements. Small right maxillary sinus mucous retention cysts. Trace left mastoid effusion. IMPRESSION: No intracranial mass or acute intracranial abnormality. Unchanged small chronic focus of cortical encephalomalacia within the anterior inferolateral right frontal lobe. This may reflect a small chronic infarct or sequela of prior trauma. Electronically Signed   By: Kellie Simmering   On: 09/24/2018 10:55     ASSESSMENT & PLAN:  Malignant neoplasm of prostate ([place]) # Castrate sensitive-metastatic prostate cancer to the bone. Stage IV-on ADT/Zytiga low-dose. Bone scan March 70 2020-L3 uptake stable; otherwise treated disease.  Stable.  # Continue Zytiga 250 mg once a day with food.  No major side effects noted.  Continue Eligard every 3 months.  If PSA worsening then recommend further imaging.  PSA from today pending.  #Labile blood pressures-stable as per patient  # Dizzy- [Dr. Shah]; MRI brain no acute process reviewed with the patient.  #A. fib paroxysmal-currently in sinus rhythm.  Not on anticoagulation.  Aspirin Plavix.  Stable  #Spoke to patient's wife.  # DISPOSITION:  #Eligard today # follow up in 3 months; Eligard/ labs-cbc/cmp/PSA-Dr.B  Cc; Dr.Scott.    No orders of the defined types were placed in this encounter.  All questions were answered. The patient knows to call the clinic with  any problems, questions or concerns.      Cammie Sickle, MD 09/25/2018 11:05 AM

## 2018-09-25 NOTE — Progress Notes (Signed)
Patient does not offer any problems today.  He gets Eligard every 3 months and last received injection on 4//22/20 but is not on schedule for injection today.  Patient had an MRI on 09/23/18 ordered by neurology due to dizzy spells.

## 2018-09-25 NOTE — Assessment & Plan Note (Addendum)
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 Bone scan March 70 2020-L3 uptake stable; otherwise treated disease.  Stable.  # Continue Zytiga 250 mg once a day with food.  No major side effects noted.  Continue Eligard every 3 months.  If PSA worsening then recommend further imaging.  PSA from today pending.  #Labile blood pressures-stable as per patient  # Dizzy- [Dr. Shah]; MRI brain no acute process reviewed with the patient.  #A. fib paroxysmal-currently in sinus rhythm.  Not on anticoagulation.  Aspirin Plavix.  Stable  #Spoke to patient's wife.  # DISPOSITION:  #Eligard today # follow up in 3 months; Eligard/ labs-cbc/cmp/PSA-Dr.B  Cc; Dr.Scott.

## 2018-09-26 DIAGNOSIS — H43813 Vitreous degeneration, bilateral: Secondary | ICD-10-CM | POA: Diagnosis not present

## 2018-10-07 ENCOUNTER — Other Ambulatory Visit: Payer: Self-pay | Admitting: Internal Medicine

## 2018-10-17 ENCOUNTER — Other Ambulatory Visit: Payer: Self-pay | Admitting: Internal Medicine

## 2018-10-17 NOTE — Telephone Encounter (Signed)
CBC with Differential Order: [id number] Status:  Final result Visible to patient:  Yes (MyChart) Next appt:  11/29/2018 at 08:00 AM in Family Medicine ([facility]) Dx:  Malignant neoplasm of prostate (Nathaniel Thompson)  Ref Range & Units 3wk ago 19mo ago 67mo ago  WBC 4.0 - 10.5 K/uL 4.6  4.3  5.5   RBC 4.22 - 5.81 MIL/uL 3.92Low   4.25  4.33   Hemoglobin 13.0 - 17.0 g/dL 11.2Low   12.6Low   12.2Low    HCT 39.0 - 52.0 % 33.6Low   36.9Low   37.4Low    MCV 80.0 - 100.0 fL 85.7  86.8  86.4   MCH 26.0 - 34.0 pg 28.6  29.6  28.2   MCHC 30.0 - 36.0 g/dL 33.3  34.1  32.6   RDW 11.5 - 15.5 % 13.1  13.7  13.6   Platelets 150 - 400 K/uL 199  203  196   nRBC 0.0 - 0.2 % 0.0  0.0  0.0   Neutrophils Relative % % 54  56  50   Neutro Abs 1.7 - 7.7 K/uL 2.5  2.4  2.8   Lymphocytes Relative % 28  29  29    Lymphs Abs 0.7 - 4.0 K/uL 1.3  1.2  1.6   Monocytes Relative % 13  10  13    Monocytes Absolute 0.1 - 1.0 K/uL 0.6  0.4  0.7   Eosinophils Relative % 4  4  7    Eosinophils Absolute 0.0 - 0.5 K/uL 0.2  0.2  0.4   Basophils Relative % 1  1  1    Basophils Absolute 0.0 - 0.1 K/uL 0.0  0.0  0.0   Immature Granulocytes % 0  0  0   Abs Immature Granulocytes 0.00 - 0.07 K/uL 0.01  0.01 CM  0.01 CM   Comment: Performed at [facility], [address] [place], [place] [address]  Resulting Agency  [facility] CLIN LAB [facility] CL[facility] LAB [facility] LAB      Specimen Collected: 09/25/18 09:19 Last Resulted: 09/25/18 09:27     Lab Flowsheet   Order Details   View Encounter   Lab and Collection Details   Routing   Result History     CM=Additional comments      Other Results from 09/25/2018  PSA Order: [id number]  Status:  Final result Visible to patient:  Yes ([place]) Next appt:  11/29/2018 at 08:00 AM in Family Medicine ([facility]) Dx:  Malignant neoplasm of prostate ([facility])  Ref Range & Units 3wk ago 33mo ago 34mo ago  Prostatic Specific Antigen 0.00 - 4.00 ng/mL 0.01  0.02 CM  0.02 CM   Comment: (NOTE)   While PSA levels of <=4.0 ng/ml are reported as reference range, some  men with levels below 4.0 ng/ml can have prostate cancer and many men  with PSA above 4.0 ng/ml do not have prostate cancer. Other tests  such as free PSA, age specific reference ranges, PSA velocity and PSA  doubling time may be helpful especially in men less than 47 years  old.  Performed at [facility], [place] [address], [place], [place]  [address]   Resulting Agency  [facility] CL[facility] LAB [facility] CL[facility] LAB [facility] LAB      Specimen Collected: 09/25/18 09:19 Last Resulted: 09/25/18 20:17     Lab Flowsheet   Order Details   View Encounter   Lab and Collection Details   Routing   Result History  CM=Additional comments        Contains abnormal data Comprehensive metabolic panel Order: [id number]  Status:  Final result Visible to patient:  Yes (MyChart) Next appt:  11/29/2018 at 08:00 AM in Family Medicine ([facility]) Dx:  Malignant neoplasm of prostate ([name])  Ref Range & Units 3wk ago (09/25/18) 3wk ago (09/23/18) 26mo ago (06/26/18)  Sodium 135 - 145 mmol/L 140   139   Potassium 3.5 - 5.1 mmol/L 3.7   3.3Low    Chloride 98 - 111 mmol/L 107   108   CO2 22 - 32 mmol/L 26   26   Glucose, Bld 70 - 99 mg/dL 99   114High    BUN 8 - 23 mg/dL 18   18   Creatinine, Ser 0.61 - 1.24 mg/dL 1.18  1.20  1.31High    Calcium 8.9 - 10.3 mg/dL 9.2   8.9   Total Protein 6.5 - 8.1 g/dL 6.8   6.9   Albumin 3.5 - 5.0 g/dL 3.7   3.7   AST 15 - 41 U/L 22   24   ALT 0 - 44 U/L 15   17   Alkaline Phosphatase 38 - 126 U/L 62   64   Total Bilirubin 0.3 - 1.2 mg/dL 0.7   0.6   GFR calc non Af Amer >60 mL/min 58Low    51Low    GFR calc Af Amer >60 mL/min >60   59Low    Anion gap 5 - 15 7   5  CM   Comment: Performed at [facility], [address] [place], [place] [address]  Resulting Agency  [facility]IN LAB [facility] LAB [facility] LAB      Specimen Collected: 09/25/18 09:19 Last Resulted: 09/25/18 09:43

## 2018-10-21 MED FILL — ABIRATERONE ACETATE 250 MG: 250 | 30 days supply | Qty: 30 | Fill #0

## 2018-10-25 DIAGNOSIS — R569 Unspecified convulsions: Secondary | ICD-10-CM | POA: Diagnosis not present

## 2018-10-26 DIAGNOSIS — R569 Unspecified convulsions: Secondary | ICD-10-CM | POA: Diagnosis not present

## 2018-10-27 DIAGNOSIS — R569 Unspecified convulsions: Secondary | ICD-10-CM | POA: Diagnosis not present

## 2018-11-05 ENCOUNTER — Ambulatory Visit (INDEPENDENT_AMBULATORY_CARE_PROVIDER_SITE_OTHER): Payer: Medicare HMO

## 2018-11-05 ENCOUNTER — Other Ambulatory Visit: Payer: Self-pay

## 2018-11-05 DIAGNOSIS — Z23 Encounter for immunization: Secondary | ICD-10-CM

## 2018-11-20 ENCOUNTER — Telehealth: Payer: Self-pay

## 2018-11-20 MED FILL — ABIRATERONE ACETATE 250 MG: 250 | 30 days supply | Qty: 30 | Fill #1

## 2018-11-20 NOTE — Telephone Encounter (Signed)
Advised that patients wife can come back since he is hard of hearing. Advised that they will have to be screened

## 2018-11-20 NOTE — Telephone Encounter (Signed)
Copied from [place] [phone]. Topic: General - Inquiry >> Nov 20, 2018  8:39 AM Virl Axe D wrote: Reason for CRM: Pt's wife Holley Raring would like to know if she can come with pt to [place] on 11/22/18. Pt likes to have wife there due to him being hard of hearing. Please advise. CB# [phone]

## 2018-11-21 DIAGNOSIS — G4733 Obstructive sleep apnea (adult) (pediatric): Secondary | ICD-10-CM | POA: Diagnosis not present

## 2018-11-22 ENCOUNTER — Ambulatory Visit: Payer: Medicare HMO | Admitting: Internal Medicine

## 2018-11-22 ENCOUNTER — Encounter: Payer: Self-pay | Admitting: Internal Medicine

## 2018-11-22 ENCOUNTER — Ambulatory Visit (INDEPENDENT_AMBULATORY_CARE_PROVIDER_SITE_OTHER): Payer: Medicare HMO

## 2018-11-22 ENCOUNTER — Other Ambulatory Visit: Payer: Self-pay

## 2018-11-22 DIAGNOSIS — M545 Low back pain: Secondary | ICD-10-CM | POA: Diagnosis not present

## 2018-11-22 DIAGNOSIS — I779 Disorder of arteries and arterioles, unspecified: Secondary | ICD-10-CM

## 2018-11-22 DIAGNOSIS — I739 Peripheral vascular disease, unspecified: Secondary | ICD-10-CM

## 2018-11-22 DIAGNOSIS — I2510 Atherosclerotic heart disease of native coronary artery without angina pectoris: Secondary | ICD-10-CM

## 2018-11-22 DIAGNOSIS — E7800 Pure hypercholesterolemia, unspecified: Secondary | ICD-10-CM | POA: Diagnosis not present

## 2018-11-22 DIAGNOSIS — N189 Chronic kidney disease, unspecified: Secondary | ICD-10-CM | POA: Diagnosis not present

## 2018-11-22 DIAGNOSIS — R739 Hyperglycemia, unspecified: Secondary | ICD-10-CM

## 2018-11-22 DIAGNOSIS — I4891 Unspecified atrial fibrillation: Secondary | ICD-10-CM | POA: Diagnosis not present

## 2018-11-22 DIAGNOSIS — G4730 Sleep apnea, unspecified: Secondary | ICD-10-CM

## 2018-11-22 DIAGNOSIS — M1611 Unilateral primary osteoarthritis, right hip: Secondary | ICD-10-CM | POA: Diagnosis not present

## 2018-11-22 DIAGNOSIS — I10 Essential (primary) hypertension: Secondary | ICD-10-CM

## 2018-11-22 DIAGNOSIS — R42 Dizziness and giddiness: Secondary | ICD-10-CM

## 2018-11-22 DIAGNOSIS — J449 Chronic obstructive pulmonary disease, unspecified: Secondary | ICD-10-CM | POA: Diagnosis not present

## 2018-11-22 DIAGNOSIS — M25551 Pain in right hip: Secondary | ICD-10-CM

## 2018-11-22 DIAGNOSIS — C61 Malignant neoplasm of prostate: Secondary | ICD-10-CM

## 2018-11-22 LAB — BASIC METABOLIC PANEL
BUN: 18 mg/dL (ref 6–23)
CO2: 27 mEq/L (ref 19–32)
Calcium: 9.7 mg/dL (ref 8.4–10.5)
Chloride: 107 mEq/L (ref 96–112)
Creatinine, Ser: 1.1 mg/dL (ref 0.40–1.50)
GFR: 64.25 mL/min (ref >60.00)
Glucose, Bld: 106 mg/dL — ABNORMAL HIGH (ref 70–99)
Potassium: 4 mEq/L (ref 3.5–5.1)
Sodium: 142 mEq/L (ref 135–145)

## 2018-11-22 LAB — HEPATIC FUNCTION PANEL
ALT: 14 U/L (ref 0–53)
AST: 18 U/L (ref 0–37)
Albumin: 4 g/dL (ref 3.5–5.2)
Alkaline Phosphatase: 64 U/L (ref 39–117)
Bilirubin, Direct: 0.1 mg/dL (ref 0.0–0.3)
Total Bilirubin: 0.6 mg/dL (ref 0.2–1.2)
Total Protein: 6.6 g/dL (ref 6.0–8.3)

## 2018-11-22 LAB — LIPID PANEL
Cholesterol: 124 mg/dL (ref 0–200)
HDL: 30.3 mg/dL — ABNORMAL LOW (ref >39.00)
LDL Cholesterol: 56 mg/dL (ref 0–99)
NonHDL: 93.49
Total CHOL/HDL Ratio: 4
Triglycerides: 189 mg/dL — ABNORMAL HIGH (ref 0.0–149.0)
VLDL: 37.8 mg/dL (ref 0.0–40.0)

## 2018-11-22 LAB — HEMOGLOBIN A1C: Hgb A1c MFr Bld: 6.1 % (ref 4.6–6.5)

## 2018-11-22 MED ORDER — METHYLPREDNISOLONE 4 MG PO TBPK: ORAL_TABLET | ORAL | 0 refills | Status: DC

## 2018-11-22 MED ORDER — LOSARTAN POTASSIUM 50 MG PO TABS: 50.0000 mg | ORAL_TABLET | Freq: Every day | ORAL | 1 refills | Status: DC

## 2018-11-22 NOTE — Progress Notes (Signed)
Patient ID: Nathaniel Thompson, male   DOB: 1935-07-10, 83 y.o.   MRN: [id number]   Subjective:    Patient ID: Nathaniel Thompson, male    DOB: 07-24-1935, 83 y.o.   MRN: [id number]  HPI  Patient here as a work in appt with concerns regarding right hip/leg pain.  He is accompanied by his wife.  History obtained from both of them.  He declines any injury or trauma.  Increased pain.  Hurts with walking and with sitting or position changes.  May take a tylenol prn.  Pain radiating.  Discussed prednisone.  He has taken prednisone previously and tolerated.  No chest pain. No sob.  No acid reflux.  No abdominal pain.  Bowels are moving.  Still with some sob.  He has seen and been evaluated by cardiology.  Also followed by pulmonary.  Has known sleep apnea.  Doing well with cpap.  Still with sob with exertion.  Will contact pulmonary regarding any further testing, etc.     Past Medical History:  Diagnosis Date   Allergic rhinitis    Barrett's esophagus 10/21/13   Bone cancer ([place])    Chronic kidney disease    stones   Colon polyp, hyperplastic    COPD (chronic obstructive pulmonary disease) (HCC)    mild COPD. former smoker   Coronary artery disease    Diverticulosis    Fundic gland polyps of stomach, benign    Gastritis    GERD (gastroesophageal reflux disease)    Hypercholesteremia    Hypertension    Prostate cancer ([place])    Prostatism    Reflux esophagitis    Renal stones    Skin cancer    left cheek/lesion excised   Skin cancer    Sleep apnea    TIA (transient ischemic attack)    TIA (transient ischemic attack)    Past Surgical History:  Procedure Laterality Date   CARDIAC CATHETERIZATION     COLONOSCOPY WITH PROPOFOL N/A 09/13/2015   Procedure: COLONOSCOPY WITH PROPOFOL;  Surgeon: Lollie Sails, MD;  Location: [facility] ENDOSCOPY;  Service: Endoscopy;  Laterality: N/A;   CORONARY ANGIOPLASTY     ESOPHAGOGASTRODUODENOSCOPY (EGD) WITH PROPOFOL N/A 09/13/2015   Procedure: ESOPHAGOGASTRODUODENOSCOPY (EGD) WITH PROPOFOL;  Surgeon: Lollie Sails, MD;  Location: [facility] ENDOSCOPY;  Service: Endoscopy;  Laterality: N/A;   ESOPHAGOGASTRODUODENOSCOPY (EGD) WITH PROPOFOL N/A 12/28/2015   Procedure: ESOPHAGOGASTRODUODENOSCOPY (EGD) WITH PROPOFOL;  Surgeon: Lollie Sails, MD;  Location: [facility] ENDOSCOPY;  Service: Endoscopy;  Laterality: N/A;   ESOPHAGOGASTRODUODENOSCOPY (EGD) WITH PROPOFOL N/A 06/16/2016   Procedure: ESOPHAGOGASTRODUODENOSCOPY (EGD) WITH PROPOFOL;  Surgeon: Lollie Sails, MD;  Location: [facility] ENDOSCOPY;  Service: Endoscopy;  Laterality: N/A;   EYE SURGERY  2013   CATARACT EXTRACTION   GANGLION CYST EXCISION Right 05/18/2017   Procedure: REMOVAL GANGLION CYST ANKLE;  Surgeon: Samara Deist, DPM;  Location: [facility] ORS;  Service: Podiatry;  Laterality: Right;   heart cath stent     LEFT HEART CATH AND CORONARY ANGIOGRAPHY Right 12/03/2017   Procedure: Left Heart Cath and Coronary Angiography with possible coronary intervention;  Surgeon: Dionisio David, MD;  Location: [facility] CV LAB;  Service: Cardiovascular;  Laterality: Right;   PROSTATE BIOPSY     stents     multiple   Family History  Problem Relation Age of Onset   Cancer Mother        gastric and lung   Cancer Father        multiple myeloma  Stroke Father    Cancer Sister        leukemia   Cancer Brother        leukemia   Cancer Brother        kidney   Cancer Daughter        Uterine   Cancer Other        [place] (Sister's Son): Prostate   Social History   Socioeconomic History   Marital status: Married    Spouse name: Not on file   Number of children: Not on file   Years of education: Not on file   Highest education level: Not on file  Occupational History   Not on file  Social Needs   Financial resource strain: Not hard at all   Food insecurity    Worry: Never true    Inability: Never true   Transportation needs    Medical: No     Non-medical: No  Tobacco Use   Smoking status: Former Smoker    Packs/day: 1.00    Years: 35.00    Pack years: 35.00    Types: Cigarettes    Quit date: 03/06/1986    Years since quitting: 32.7   Smokeless tobacco: Former Systems developer    Types: Chew  Substance and Sexual Activity   Alcohol use: No   Drug use: No   Sexual activity: Not Currently  Lifestyle   Physical activity    Days per week: Not on file    Minutes per session: Not on file   Stress: Not on file  Relationships   Social connections    Talks on phone: Not on file    Gets together: Not on file    Attends religious service: Not on file    Active member of club or organization: Not on file    Attends meetings of clubs or organizations: Not on file    Relationship status: Not on file  Other Topics Concern   Not on file  Social History Narrative   Not on file    Outpatient Encounter Medications as of 11/22/2018  Medication Sig   abiraterone acetate (ZYTIGA) 250 MG tablet TAKE 1 TABLET (250 MG TOTAL) BY MOUTH DAILY.   amiodarone (PACERONE) 400 MG tablet Take 1 tablet (400 mg total) by mouth daily.   amLODipine (NORVASC) 2.5 MG tablet TAKE 1 TABLET BY MOUTH ONCE DAILY   aspirin EC 81 MG tablet Take 81 mg by mouth daily.   calcium citrate-vitamin D (CITRACAL+D) 315-200 MG-UNIT tablet Take 1 tablet by mouth daily.   cholecalciferol (VITAMIN D) 1000 units tablet Take 1,000 Units by mouth daily.    clopidogrel (PLAVIX) 75 MG tablet Take 1 tablet (75 mg total) by mouth daily.   fexofenadine (ALLEGRA) 180 MG tablet Take 180 mg by mouth daily as needed for allergies or rhinitis.   fluticasone (FLONASE) 50 MCG/ACT nasal spray Place 2 sprays into both nostrils daily as needed for allergies.    hydrochlorothiazide (HYDRODIURIL) 12.5 MG tablet TAKE 1 TABLET BY MOUTH ONCE EVERY MORNING   Ipratropium-Albuterol (COMBIVENT) 20-100 MCG/ACT AERS respimat 2 puffs qid prn (Patient taking differently: Inhale 2 puffs into the  lungs every 6 (six) hours as needed for wheezing. )   isosorbide mononitrate (IMDUR) 30 MG 24 hr tablet TAKE 1 TABLET BY MOUTH ONCE DAILY   losartan (COZAAR) 50 MG tablet Take 1 tablet (50 mg total) by mouth daily.   lovastatin (MEVACOR) 40 MG tablet TAKE 2 TABLETS BY MOUTH AT BEDTIME  methylPREDNISolone (MEDROL DOSEPAK) 4 MG TBPK tablet Medrol dose pack.  6 day taper.   Multiple Vitamin (MULTI-VITAMINS) TABS Take 1 tablet by mouth daily.    nitroGLYCERIN (NITROSTAT) 0.4 MG SL tablet Place 0.4 mg under the tongue every 5 (five) minutes as needed for chest pain.   pantoprazole (PROTONIX) 40 MG tablet Take 40 mg by mouth 2 (two) times daily.    [DISCONTINUED] famotidine (PEPCID) 20 MG tablet Take 1 tablet (20 mg total) by mouth 2 (two) times daily.   [DISCONTINUED] losartan (COZAAR) 50 MG tablet Take 50 mg by mouth daily.   No facility-administered encounter medications on file as of 11/22/2018.    Review of Systems  Constitutional: Negative for appetite change and unexpected weight change.  HENT: Negative for congestion and sinus pressure.   Respiratory: Positive for shortness of breath. Negative for cough and chest tightness.   Cardiovascular: Negative for chest pain, palpitations and leg swelling.  Gastrointestinal: Negative for abdominal pain, diarrhea, nausea and vomiting.  Genitourinary: Negative for difficulty urinating and dysuria.  Musculoskeletal: Negative for joint swelling and myalgias.       Right hip and leg pain.    Skin: Negative for color change and rash.  Neurological: Negative for light-headedness and headaches.       Dizziness better.   Psychiatric/Behavioral: Negative for agitation and dysphoric mood.       Objective:    Physical Exam Constitutional:      General: He is not in acute distress.    Appearance: Normal appearance. He is well-developed.  HENT:     Head: Normocephalic and atraumatic.  Cardiovascular:     Rate and Rhythm: Normal rate and  regular rhythm.  Pulmonary:     Effort: Pulmonary effort is normal. No respiratory distress.     Breath sounds: Normal breath sounds.  Abdominal:     General: Bowel sounds are normal.     Palpations: Abdomen is soft.     Tenderness: There is no abdominal tenderness.  Skin:    Findings: No erythema or rash.  Neurological:     Mental Status: He is alert.  Psychiatric:        Mood and Affect: Mood normal.        Behavior: Behavior normal.     BP (!) 158/88    Pulse 68    Temp (!) 96.4 F (35.8 C)    Resp 16    Ht '5\' 10"'  (1.778 m)    Wt 211 lb 12.8 oz (96.1 kg)    SpO2 97%    BMI 30.39 kg/m  Wt Readings from Last 3 Encounters:  11/22/18 211 lb 12.8 oz (96.1 kg)  09/25/18 208 lb 12.8 oz (94.7 kg)  09/19/18 209 lb (94.8 kg)     Lab Results  Component Value Date   WBC 4.6 09/25/2018   HGB 11.2 (L) 09/25/2018   HCT 33.6 (L) 09/25/2018   PLT 199 09/25/2018   GLUCOSE 106 (H) 11/22/2018   CHOL 124 11/22/2018   TRIG 189.0 (H) 11/22/2018   HDL 30.30 (L) 11/22/2018   LDLCALC 56 11/22/2018   ALT 14 11/22/2018   AST 18 11/22/2018   NA 142 11/22/2018   K 4.0 11/22/2018   CL 107 11/22/2018   CREATININE 1.10 11/22/2018   BUN 18 11/22/2018   CO2 27 11/22/2018   TSH 2.864 11/26/2017   PSA 5.47 (H) 08/03/2016   HGBA1C 6.1 11/22/2018    Mr Brain W Wo Contrast  Result Date: 09/24/2018  CLINICAL DATA:  Dizziness. Additional history provided: Dizzy episodes, patient reports history of stroke and prostate cancer EXAM: MRI HEAD WITHOUT AND WITH CONTRAST TECHNIQUE: Multiplanar, multiecho pulse sequences of the brain and surrounding structures were obtained without and with intravenous contrast. CONTRAST:  10 mL intravenous Gadavist COMPARISON:  Brain MRI 09/19/2017, brain MRI 12/25/2009, FINDINGS: Brain: There is no acute infarct, intracranial hemorrhage, intracranial mass, midline shift or extra-axial collection. Redemonstrated small chronic focus of cortical encephalomalacia in the anterior  inferolateral right frontal lobe (series 10, image 14). Unchanged nonspecific punctate focus of T2/FLAIR hyperintensity within the left frontal lobe white matter. No new parenchymal signal abnormality. Cerebral volume is age appropriate and unchanged. Incidentally noted small right cerebellar developmental venous anomaly (series 18, image 37). No other abnormal intracranial enhancement. Vascular: Normal flow voids within the proximal large vessels. Skull and upper cervical spine: Normal marrow signal. Sinuses/Orbits: Bilateral lens replacements. Small right maxillary sinus mucous retention cysts. Trace left mastoid effusion. IMPRESSION: No intracranial mass or acute intracranial abnormality. Unchanged small chronic focus of cortical encephalomalacia within the anterior inferolateral right frontal lobe. This may reflect a small chronic infarct or sequela of prior trauma. Electronically Signed   By: Kellie Simmering   On: 09/24/2018 10:55       Assessment & Plan:   Problem List Items Addressed This Visit    Atrial fibrillation, new onset ([place])    Followed by cardiology.  On amiodarone.  Stable.        Relevant Medications   losartan (COZAAR) 50 MG tablet   CAD (coronary artery disease)    Describes the sob as outlined.   Has been evaluated by cardiology.  Recent cath - no significant CAD.  S/p previous stent placemetn.  Cardiology felt no further w/up warranted.  Continue risk factor modification.        Relevant Medications   losartan (COZAAR) 50 MG tablet   Carotid artery disease ([place])    Had CTA.  No significant stenosis.        Relevant Medications   losartan (COZAAR) 50 MG tablet   CKD (chronic kidney disease)    Avoid antiinflammatories.  Stay hydrated.  Follow metabolic panel.       COPD (chronic obstructive pulmonary disease) (HCC)    Followed by pulmonary.  No increased cough or congestion.  Still with persistent sob with exertion.  Will d/w pulmonary regarding question of need for  further w/up.        Relevant Medications   methylPREDNISolone (MEDROL DOSEPAK) 4 MG TBPK tablet   Dizziness    Has had extensive w/up.  Saw neurology.  EEG - ok.  Stable.        Hypercholesterolemia    On lovastatin.  Low cholesterol diet and exercise.  Follow lipid panel and liver function tests.        Relevant Medications   losartan (COZAAR) 50 MG tablet   Hyperglycemia    Low carb diet and exercise.  Follow met b and a1c.       Hypertension, essential    Blood pressure under good control.  Continue same medication regimen.  Follow pressures.  Follow metabolic panel.        Relevant Medications   losartan (COZAAR) 50 MG tablet   Malignant neoplasm of prostate ([place])    With prostate cancer - metastatic.  Had bone scan 05/2018, uptake stable - treated disease.  Felt stable.  Continue f/u with oncology.        Relevant  Medications   methylPREDNISolone (MEDROL DOSEPAK) 4 MG TBPK tablet   Right hip pain    Persistent right hip and leg pain.  Given increased pain, will treat with medrol dose pack.  Check xray.        Relevant Orders   DG Lumbar Spine 2-3 Views (Completed)   DG HIP UNILAT WITH PELVIS 2-3 VIEWS RIGHT (Completed)   Sleep apnea    Using cpap.  Follow.            Einar Pheasant, MD

## 2018-11-23 ENCOUNTER — Encounter: Payer: Self-pay | Admitting: Internal Medicine

## 2018-11-24 ENCOUNTER — Encounter: Payer: Self-pay | Admitting: Internal Medicine

## 2018-11-24 NOTE — Assessment & Plan Note (Signed)
On lovastatin.  Low cholesterol diet and exercise.  Follow lipid panel and liver function tests.   

## 2018-11-24 NOTE — Assessment & Plan Note (Signed)
Avoid antiinflammatories.  Stay hydrated.  Follow metabolic panel.   

## 2018-11-24 NOTE — Assessment & Plan Note (Signed)
Using cpap.  Follow.  

## 2018-11-24 NOTE — Assessment & Plan Note (Signed)
Low carb diet and exercise.  Follow met b and a1c.  

## 2018-11-24 NOTE — Assessment & Plan Note (Signed)
With prostate cancer - metastatic.  Had bone scan 05/2018, uptake stable - treated disease.  Felt stable.  Continue f/u with oncology.

## 2018-11-24 NOTE — Assessment & Plan Note (Signed)
Had CTA.  No significant stenosis.

## 2018-11-24 NOTE — Assessment & Plan Note (Signed)
Followed by pulmonary.  No increased cough or congestion.  Still with persistent sob with exertion.  Will d/w pulmonary regarding question of need for further w/up.

## 2018-11-24 NOTE — Assessment & Plan Note (Signed)
Describes the sob as outlined.   Has been evaluated by cardiology.  Recent cath - no significant CAD.  S/p previous stent placemetn.  Cardiology felt no further w/up warranted.  Continue risk factor modification.

## 2018-11-24 NOTE — Assessment & Plan Note (Signed)
Followed by cardiology.  On amiodarone.  Stable.

## 2018-11-24 NOTE — Assessment & Plan Note (Signed)
Persistent right hip and leg pain.  Given increased pain, will treat with medrol dose pack.  Check xray.

## 2018-11-24 NOTE — Assessment & Plan Note (Signed)
Blood pressure under good control.  Continue same medication regimen.  Follow pressures.  Follow metabolic panel.   

## 2018-11-24 NOTE — Assessment & Plan Note (Signed)
Has had extensive w/up.  Saw neurology.  EEG - ok.  Stable.

## 2018-11-25 ENCOUNTER — Encounter: Payer: Self-pay | Admitting: *Deleted

## 2018-11-28 ENCOUNTER — Other Ambulatory Visit: Payer: Self-pay

## 2018-11-29 ENCOUNTER — Other Ambulatory Visit: Payer: Medicare HMO

## 2018-12-02 ENCOUNTER — Ambulatory Visit (INDEPENDENT_AMBULATORY_CARE_PROVIDER_SITE_OTHER): Payer: Medicare HMO | Admitting: Internal Medicine

## 2018-12-02 ENCOUNTER — Other Ambulatory Visit: Payer: Self-pay

## 2018-12-02 DIAGNOSIS — I714 Abdominal aortic aneurysm, without rupture, unspecified: Secondary | ICD-10-CM

## 2018-12-02 DIAGNOSIS — E7800 Pure hypercholesterolemia, unspecified: Secondary | ICD-10-CM

## 2018-12-02 DIAGNOSIS — N189 Chronic kidney disease, unspecified: Secondary | ICD-10-CM | POA: Diagnosis not present

## 2018-12-02 DIAGNOSIS — R739 Hyperglycemia, unspecified: Secondary | ICD-10-CM

## 2018-12-02 DIAGNOSIS — J449 Chronic obstructive pulmonary disease, unspecified: Secondary | ICD-10-CM | POA: Diagnosis not present

## 2018-12-02 DIAGNOSIS — D649 Anemia, unspecified: Secondary | ICD-10-CM

## 2018-12-02 DIAGNOSIS — M25551 Pain in right hip: Secondary | ICD-10-CM

## 2018-12-02 DIAGNOSIS — I10 Essential (primary) hypertension: Secondary | ICD-10-CM

## 2018-12-02 DIAGNOSIS — R42 Dizziness and giddiness: Secondary | ICD-10-CM | POA: Diagnosis not present

## 2018-12-02 DIAGNOSIS — I2510 Atherosclerotic heart disease of native coronary artery without angina pectoris: Secondary | ICD-10-CM | POA: Diagnosis not present

## 2018-12-02 DIAGNOSIS — G4730 Sleep apnea, unspecified: Secondary | ICD-10-CM

## 2018-12-02 DIAGNOSIS — C61 Malignant neoplasm of prostate: Secondary | ICD-10-CM | POA: Diagnosis not present

## 2018-12-02 DIAGNOSIS — I4891 Unspecified atrial fibrillation: Secondary | ICD-10-CM | POA: Diagnosis not present

## 2018-12-02 NOTE — Progress Notes (Signed)
Patient ID: Nathaniel Thompson, male   DOB: 1935-09-16, 83 y.o.   MRN: [id number]   Subjective:    Patient ID: Nathaniel Thompson, male    DOB: 02-27-1936, 83 y.o.   MRN: [id number]  HPI  Patient here for a scheduled follow up.  He is accompanied by his wife.  History obtained from both of them.  Has been seen recently for hip/back pain.  Discussed xray results.  L-S spine xray reported DDD - worst - L 2-3. They had questions regarding the pain given bone scan results showed increased uptake in the same area.  Discussed f/u with oncology.  Will hold on PT at this time.  Also noted on the xray was a 3.7 cm abdominal aortic aneurysm.  Discussed f/u.  Would like to get him established with vascular surgery for baseline ultrasound and continued f/u.  Pt is agreeable.  No chest pain. Breathing stable.  Dizziness better.  No abdominal pain.  Bowels moving.     Past Medical History:  Diagnosis Date  . Allergic rhinitis   . Barrett's esophagus 10/21/13  . Bone cancer ([place])   . Chronic kidney disease    stones  . Colon polyp, hyperplastic   . COPD (chronic obstructive pulmonary disease) (HCC)    mild COPD. former smoker  . Coronary artery disease   . Diverticulosis   . Fundic gland polyps of stomach, benign   . Gastritis   . GERD (gastroesophageal reflux disease)   . Hypercholesteremia   . Hypertension   . Prostate cancer ([place])   . Prostatism   . Reflux esophagitis   . Renal stones   . Skin cancer    left cheek/lesion excised  . Skin cancer   . Sleep apnea   . TIA (transient ischemic attack)   . TIA (transient ischemic attack)    Past Surgical History:  Procedure Laterality Date  . CARDIAC CATHETERIZATION    . COLONOSCOPY WITH PROPOFOL N/A 09/13/2015   Procedure: COLONOSCOPY WITH PROPOFOL;  Surgeon: Lollie Sails, MD;  Location: [facility] ENDOSCOPY;  Service: Endoscopy;  Laterality: N/A;  . CORONARY ANGIOPLASTY    . ESOPHAGOGASTRODUODENOSCOPY (EGD) WITH PROPOFOL N/A 09/13/2015   Procedure:  ESOPHAGOGASTRODUODENOSCOPY (EGD) WITH PROPOFOL;  Surgeon: Lollie Sails, MD;  Location: [facility] ENDOSCOPY;  Service: Endoscopy;  Laterality: N/A;  . ESOPHAGOGASTRODUODENOSCOPY (EGD) WITH PROPOFOL N/A 12/28/2015   Procedure: ESOPHAGOGASTRODUODENOSCOPY (EGD) WITH PROPOFOL;  Surgeon: Lollie Sails, MD;  Location: [facility] ENDOSCOPY;  Service: Endoscopy;  Laterality: N/A;  . ESOPHAGOGASTRODUODENOSCOPY (EGD) WITH PROPOFOL N/A 06/16/2016   Procedure: ESOPHAGOGASTRODUODENOSCOPY (EGD) WITH PROPOFOL;  Surgeon: Lollie Sails, MD;  Location: [facility] ENDOSCOPY;  Service: Endoscopy;  Laterality: N/A;  . EYE SURGERY  2013   CATARACT EXTRACTION  . GANGLION CYST EXCISION Right 05/18/2017   Procedure: REMOVAL GANGLION CYST ANKLE;  Surgeon: Samara Deist, DPM;  Location: [facility] ORS;  Service: Podiatry;  Laterality: Right;  . heart cath stent    . LEFT HEART CATH AND CORONARY ANGIOGRAPHY Right 12/03/2017   Procedure: Left Heart Cath and Coronary Angiography with possible coronary intervention;  Surgeon: Dionisio David, MD;  Location: [place] CV LAB;  Service: Cardiovascular;  Laterality: Right;  . PROSTATE BIOPSY    . stents     multiple   Family History  Problem Relation Age of Onset  . Cancer Mother        gastric and lung  . Cancer Father        multiple myeloma  .  Stroke Father   . Cancer Sister        leukemia  . Cancer Brother        leukemia  . Cancer Brother        kidney  . Cancer Daughter        Uterine  . Cancer Other        [place] (Sister's Son): Prostate   Social History   Socioeconomic History  . Marital status: Married    Spouse name: Not on file  . Number of children: Not on file  . Years of education: Not on file  . Highest education level: Not on file  Occupational History  . Not on file  Social Needs  . Financial resource strain: Not hard at all  . Food insecurity    Worry: Never true    Inability: Never true  . Transportation needs    Medical: No     Non-medical: No  Tobacco Use  . Smoking status: Former Smoker    Packs/day: 1.00    Years: 35.00    Pack years: 35.00    Types: Cigarettes    Quit date: 03/06/1986    Years since quitting: 32.7  . Smokeless tobacco: Former Systems developer    Types: Chew  Substance and Sexual Activity  . Alcohol use: No  . Drug use: No  . Sexual activity: Not Currently  Lifestyle  . Physical activity    Days per week: Not on file    Minutes per session: Not on file  . Stress: Not on file  Relationships  . Social Herbalist on phone: Not on file    Gets together: Not on file    Attends religious service: Not on file    Active member of club or organization: Not on file    Attends meetings of clubs or organizations: Not on file    Relationship status: Not on file  Other Topics Concern  . Not on file  Social History Narrative  . Not on file    Outpatient Encounter Medications as of 12/02/2018  Medication Sig  . abiraterone acetate (ZYTIGA) 250 MG tablet TAKE 1 TABLET (250 MG TOTAL) BY MOUTH DAILY.  [name] amiodarone (PACERONE) 400 MG tablet Take 1 tablet (400 mg total) by mouth daily.  [name] amLODipine (NORVASC) 2.5 MG tablet TAKE 1 TABLET BY MOUTH ONCE DAILY  . aspirin EC 81 MG tablet Take 81 mg by mouth daily.  . calcium citrate-vitamin D (CITRACAL+D) 315-200 MG-UNIT tablet Take 1 tablet by mouth daily.  . cholecalciferol (VITAMIN D) 1000 units tablet Take 1,000 Units by mouth daily.   . clopidogrel (PLAVIX) 75 MG tablet Take 1 tablet (75 mg total) by mouth daily.  . fexofenadine (ALLEGRA) 180 MG tablet Take 180 mg by mouth daily as needed for allergies or rhinitis.  . fluticasone (FLONASE) 50 MCG/ACT nasal spray Place 2 sprays into both nostrils daily as needed for allergies.   . hydrochlorothiazide (HYDRODIURIL) 12.5 MG tablet TAKE 1 TABLET BY MOUTH ONCE EVERY MORNING  . Ipratropium-Albuterol (COMBIVENT) 20-100 MCG/ACT AERS respimat 2 puffs qid prn (Patient taking differently: Inhale 2 puffs into the  lungs every 6 (six) hours as needed for wheezing. )  . isosorbide mononitrate (IMDUR) 30 MG 24 hr tablet TAKE 1 TABLET BY MOUTH ONCE DAILY  . losartan (COZAAR) 50 MG tablet Take 1 tablet (50 mg total) by mouth daily.  [name] lovastatin (MEVACOR) 40 MG tablet TAKE 2 TABLETS BY MOUTH AT BEDTIME  .  Multiple Vitamin (MULTI-VITAMINS) TABS Take 1 tablet by mouth daily.   . nitroGLYCERIN (NITROSTAT) 0.4 MG SL tablet Place 0.4 mg under the tongue every 5 (five) minutes as needed for chest pain.  . pantoprazole (PROTONIX) 40 MG tablet Take 40 mg by mouth 2 (two) times daily.   . [DISCONTINUED] methylPREDNISolone (MEDROL DOSEPAK) 4 MG TBPK tablet Medrol dose pack.  6 day taper.   No facility-administered encounter medications on file as of 12/02/2018.     Review of Systems  Constitutional: Negative for appetite change and unexpected weight change.  HENT: Negative for congestion and sinus pressure.   Respiratory: Negative for cough, chest tightness and shortness of breath.   Cardiovascular: Negative for chest pain, palpitations and leg swelling.  Gastrointestinal: Negative for abdominal pain, diarrhea, nausea and vomiting.  Genitourinary: Negative for difficulty urinating and dysuria.  Musculoskeletal: Positive for back pain. Negative for joint swelling and myalgias.  Skin: Negative for color change and rash.  Neurological: Negative for dizziness and light-headedness.  Psychiatric/Behavioral: Negative for agitation and dysphoric mood.       Objective:    Physical Exam Constitutional:      General: He is not in acute distress.    Appearance: Normal appearance. He is well-developed.  HENT:     Right Ear: External ear normal.     Left Ear: External ear normal.  Eyes:     General: No scleral icterus.       Right eye: No discharge.        Left eye: No discharge.     Conjunctiva/sclera: Conjunctivae normal.  Neck:     Musculoskeletal: Neck supple. No muscular tenderness.  Cardiovascular:     Rate  and Rhythm: Normal rate and regular rhythm.  Pulmonary:     Effort: Pulmonary effort is normal. No respiratory distress.     Breath sounds: Normal breath sounds.  Abdominal:     General: Bowel sounds are normal.     Palpations: Abdomen is soft.     Tenderness: There is no abdominal tenderness.  Musculoskeletal:        General: No swelling.     Comments: Increased pain with going from siting to standing position and certain movements.    Lymphadenopathy:     Cervical: No cervical adenopathy.  Skin:    Findings: No erythema or rash.  Neurological:     Mental Status: He is alert.  Psychiatric:        Mood and Affect: Mood normal.        Behavior: Behavior normal.     BP 126/70   Pulse 76   Temp (!) 97.4 F (36.3 C)   Resp 16   Wt 210 lb 9.6 oz (95.5 kg)   SpO2 98%   BMI 30.22 kg/m  Wt Readings from Last 3 Encounters:  12/02/18 210 lb 9.6 oz (95.5 kg)  11/22/18 211 lb 12.8 oz (96.1 kg)  09/25/18 208 lb 12.8 oz (94.7 kg)     Lab Results  Component Value Date   WBC 4.6 09/25/2018   HGB 11.2 (L) 09/25/2018   HCT 33.6 (L) 09/25/2018   PLT 199 09/25/2018   GLUCOSE 106 (H) 11/22/2018   CHOL 124 11/22/2018   TRIG 189.0 (H) 11/22/2018   HDL 30.30 (L) 11/22/2018   LDLCALC 56 11/22/2018   ALT 14 11/22/2018   AST 18 11/22/2018   NA 142 11/22/2018   K 4.0 11/22/2018   CL 107 11/22/2018   CREATININE 1.10 11/22/2018  BUN 18 11/22/2018   CO2 27 11/22/2018   TSH 2.864 11/26/2017   PSA 5.47 (H) 08/03/2016   HGBA1C 6.1 11/22/2018    Mr Brain W ZO Contrast  Result Date: 09/24/2018 CLINICAL DATA:  Dizziness. Additional history provided: Dizzy episodes, patient reports history of stroke and prostate cancer EXAM: MRI HEAD WITHOUT AND WITH CONTRAST TECHNIQUE: Multiplanar, multiecho pulse sequences of the brain and surrounding structures were obtained without and with intravenous contrast. CONTRAST:  10 mL intravenous Gadavist COMPARISON:  Brain MRI 09/19/2017, brain MRI  12/25/2009, FINDINGS: Brain: There is no acute infarct, intracranial hemorrhage, intracranial mass, midline shift or extra-axial collection. Redemonstrated small chronic focus of cortical encephalomalacia in the anterior inferolateral right frontal lobe (series 10, image 14). Unchanged nonspecific punctate focus of T2/FLAIR hyperintensity within the left frontal lobe white matter. No new parenchymal signal abnormality. Cerebral volume is age appropriate and unchanged. Incidentally noted small right cerebellar developmental venous anomaly (series 18, image 37). No other abnormal intracranial enhancement. Vascular: Normal flow voids within the proximal large vessels. Skull and upper cervical spine: Normal marrow signal. Sinuses/Orbits: Bilateral lens replacements. Small right maxillary sinus mucous retention cysts. Trace left mastoid effusion. IMPRESSION: No intracranial mass or acute intracranial abnormality. Unchanged small chronic focus of cortical encephalomalacia within the anterior inferolateral right frontal lobe. This may reflect a small chronic infarct or sequela of prior trauma. Electronically Signed   By: Kellie Simmering   On: 09/24/2018 10:55       Assessment & Plan:   Problem List Items Addressed This Visit    Abdominal aortic aneurysm (AAA) ([place])    3.7 cm aneurysm found on xray. Discussed with pt and his wife.  Discussed getting him established with [facility] to obtain baseline ultrasound and then plans for continued monitoring.  He is in agreement.      Relevant Orders   Ambulatory referral to Vascular Surgery   Anemia    Decreased hgb.  Check B12 and ferritin.  Follow cbc.       Relevant Orders   CBC with Differential/Platelet   Ferritin   Vitamin B12   Atrial fibrillation, new onset ([place])    On amiodarone.  Stable.  Followed by cardiology.       CAD (coronary artery disease)    Followed by cardiology.  Recent cath - no significant CAD.  Stable.        CKD (chronic kidney disease)     Avoid antiinflammatories.  Stay hydrated.  Follow metabolic panel.        COPD (chronic obstructive pulmonary disease) (HCC)    Followed by pulmonary.  Breathing stable.       Dizziness    Has had extensive w/up.  Saw neurology. EEG ok.  Currently doing better.  Follow.          Hypercholesterolemia    On lovastatin.  Low cholesterol diet and exercise.  Follow lipid panel and liver function tests.       Relevant Orders   Hepatic function panel   Lipid panel   Hyperglycemia    Low carb diet and exercise.  Follow met b and a1c.        Relevant Orders   Hemoglobin A1c   Hypertension, essential    Blood pressure under good control.  Continue same medication regimen.  Follow pressures.  Follow metabolic panel.        Relevant Orders   TSH   Basic metabolic panel   Malignant neoplasm of prostate ([place])  Being followed by oncology.  Had bone scan - metastatic disease.  Increased uptake - L3.  Having pain as outlined.  Discuss with Dr Rogue Bussing.  Continue f/u with oncology.        Right hip pain    Back and hip pain as outlined.  Previously given medrol dose pack.  May have helped some.  He is some better.  Still pain.  Bone scan with increased uptake L-3.  Discuss with oncology.        Sleep apnea    Continue cpap.            Einar Pheasant, MD

## 2018-12-07 ENCOUNTER — Encounter: Payer: Self-pay | Admitting: Internal Medicine

## 2018-12-07 DIAGNOSIS — I714 Abdominal aortic aneurysm, without rupture, unspecified: Secondary | ICD-10-CM | POA: Insufficient documentation

## 2018-12-07 DIAGNOSIS — D649 Anemia, unspecified: Secondary | ICD-10-CM | POA: Insufficient documentation

## 2018-12-07 NOTE — Assessment & Plan Note (Signed)
Avoid antiinflammatories.  Stay hydrated.  Follow metabolic panel.   

## 2018-12-07 NOTE — Assessment & Plan Note (Signed)
3.7 cm aneurysm found on xray. Discussed with pt and his wife.  Discussed getting him established with [facility] to obtain baseline ultrasound and then plans for continued monitoring.  He is in agreement.

## 2018-12-07 NOTE — Assessment & Plan Note (Signed)
On lovastatin.  Low cholesterol diet and exercise.  Follow lipid panel and liver function tests.   

## 2018-12-07 NOTE — Assessment & Plan Note (Signed)
Low carb diet and exercise.  Follow met b and a1c.   

## 2018-12-07 NOTE — Assessment & Plan Note (Signed)
Has had extensive w/up.  Saw neurology. EEG ok.  Currently doing better.  Follow.

## 2018-12-07 NOTE — Assessment & Plan Note (Signed)
Being followed by oncology.  Had bone scan - metastatic disease.  Increased uptake - L3.  Having pain as outlined.  Discuss with Dr Rogue Bussing.  Continue f/u with oncology.

## 2018-12-07 NOTE — Assessment & Plan Note (Signed)
Back and hip pain as outlined.  Previously given medrol dose pack.  May have helped some.  He is some better.  Still pain.  Bone scan with increased uptake L-3.  Discuss with oncology.

## 2018-12-07 NOTE — Assessment & Plan Note (Signed)
On amiodarone.  Stable.  Followed by cardiology.  

## 2018-12-07 NOTE — Assessment & Plan Note (Signed)
Continue cpap.  

## 2018-12-07 NOTE — Assessment & Plan Note (Signed)
Followed by pulmonary.  Breathing stable.   

## 2018-12-07 NOTE — Assessment & Plan Note (Signed)
Blood pressure under good control.  Continue same medication regimen.  Follow pressures.  Follow metabolic panel.   

## 2018-12-07 NOTE — Assessment & Plan Note (Signed)
Decreased hgb.  Check B12 and ferritin.  Follow cbc.

## 2018-12-07 NOTE — Assessment & Plan Note (Signed)
Followed by cardiology.  Recent cath - no significant CAD.  Stable.

## 2018-12-08 NOTE — Telephone Encounter (Signed)
-----   Message from Cammie Sickle, MD sent at 12/08/2018  7:42 PM EDT ----- Regarding: RE: update and question Thank you, Dr.Emilene Roma for reaching out to me. I will be happy to follow up re: his pain/ and see if his prostate cancer is causing his worsening pain.  GB ----- Message ----- From: Einar Pheasant, MD Sent: 12/07/2018  11:04 PM EDT To: Cammie Sickle, MD Subject: update and question                            I have seen Mr Yerton recently for increased back and hip pain.  Initially affecting his walking, movements,etc.  This most recent visit, he was some better, but still with pain.  His xray revealed DDD - more localized L-3.  Mr Korab and his wife were concerned because this is the area of increased uptake on his bone scan.  They wanted you to be aware of his current symptoms and wanted me to give you an update.  Let me know if there is anything more that you need me to do.    Thank you again for all of your help.   Einar Pheasant

## 2018-12-09 ENCOUNTER — Telehealth: Payer: Self-pay | Admitting: Internal Medicine

## 2018-12-09 DIAGNOSIS — C61 Malignant neoplasm of prostate: Secondary | ICD-10-CM

## 2018-12-09 NOTE — Telephone Encounter (Signed)
Patient not available /call for hunting trip for 1 week.  I spoke to patient's wife regarding his ongoing pain in the back/discussed regarding ordering a bone scan in agreement.  I ordered bone scan approximately 1 week; please schedule.  Please call patient's wife/cell phone to make appointment for the bone scan.  Patient will keep his appointment as planned for October 21st.

## 2018-12-11 ENCOUNTER — Encounter: Payer: Self-pay | Admitting: Internal Medicine

## 2018-12-11 ENCOUNTER — Telehealth: Payer: Self-pay | Admitting: Internal Medicine

## 2018-12-11 NOTE — Telephone Encounter (Signed)
Pt wife called in and pt having a arthritis flare on hip and back. Wanted pt to be seen no appt avail. Wife did state pt is taking tylenol arth which Dr Nicki Reaper suggested he take. Please advise?  Pt does have a bone scan appt on 10/14.    Call wife @ [phone]. Thank you!

## 2018-12-11 NOTE — Telephone Encounter (Signed)
Agree with trying the medication mentioned and will f/u with oncology regarding bone scan results.

## 2018-12-11 NOTE — Telephone Encounter (Signed)
He is taking tylenol arthritis 2 tablets BID. They have reached out to oncology. He is going to try OTC lidocaine patches to see if that helps. Advised you are not in the office this pm and would be in touch with them tomorrow. Wife and pt was ok with that.

## 2018-12-11 NOTE — Telephone Encounter (Signed)
After his f/u visit with me on 12/02/18, it was decided to hold on ortho referral and I discussed his pain with Dr Rogue Bussing.  He sees him for his prostate cancer (metastatic).  He has him scheduled for a bone scan 12/18/18.  Need to confirm how much tylenol he is taking.  Has he tried anything else, lidocaine patches (over the counter), etc.  I am not in the office this pm.  Do they still feel he needs to be seen?

## 2018-12-11 NOTE — Telephone Encounter (Signed)
Per x-ray note, you were wanting to refer him to ortho. Is there something else he can take other than the tylenol arthritis?

## 2018-12-12 NOTE — Telephone Encounter (Signed)
Noted  

## 2018-12-18 ENCOUNTER — Other Ambulatory Visit: Payer: Self-pay

## 2018-12-18 ENCOUNTER — Encounter
Admission: RE | Admit: 2018-12-18 | Discharge: 2018-12-18 | Disposition: A | Discharge status: Home / Self Care | Payer: Medicare HMO | Source: Ambulatory Visit | Attending: Internal Medicine | Admitting: Internal Medicine

## 2018-12-18 DIAGNOSIS — C61 Malignant neoplasm of prostate: Secondary | ICD-10-CM | POA: Insufficient documentation

## 2018-12-18 DIAGNOSIS — C7951 Secondary malignant neoplasm of bone: Secondary | ICD-10-CM | POA: Diagnosis not present

## 2018-12-18 MED ORDER — TECHNETIUM TC 99M MEDRONATE IV KIT
23.8400 | PACK | Freq: Once | INTRAVENOUS | Status: AC | PRN
  Administered 2018-12-18: 23.84 via INTRAVENOUS

## 2018-12-18 MED FILL — ABIRATERONE ACETATE 250 MG: 250 | 30 days supply | Qty: 30 | Fill #2

## 2018-12-20 ENCOUNTER — Telehealth: Payer: Self-pay | Admitting: *Deleted

## 2018-12-20 NOTE — Telephone Encounter (Signed)
Wife/patient would like to speak to Dr. B about the bone scan results. Family concerned about results and would like to discuss the results with Dr. B if possible.  Per Dr. Jacinto Reap - he will call the patient back this afternoon

## 2018-12-21 ENCOUNTER — Telehealth: Payer: Self-pay | Admitting: Internal Medicine

## 2018-12-21 DIAGNOSIS — G4733 Obstructive sleep apnea (adult) (pediatric): Secondary | ICD-10-CM | POA: Diagnosis not present

## 2018-12-21 NOTE — Telephone Encounter (Signed)
I spoke to patient's wife/patient-regarding the reassuring results of the bone scan-no obvious evidence of progression of disease.  Continue current therapy/Zytiga and Eligard  Given the ongoing back pain recommend evaluation with orthopedics if needed.  Recommend follow-up with NP [in my absence] as planned next week for Eligard shot/evaluation of his back pain.  In agreement.

## 2018-12-21 NOTE — Telephone Encounter (Signed)
Reviewed Dr Aletha Halim phone note.  If he is having persistent pain, recommend referral to ortho.  Let me know if agreeable and I will place the order for the referral.

## 2018-12-23 ENCOUNTER — Telehealth: Payer: Self-pay | Admitting: Internal Medicine

## 2018-12-23 DIAGNOSIS — M545 Low back pain, unspecified: Secondary | ICD-10-CM

## 2018-12-23 NOTE — Telephone Encounter (Signed)
Patient aware and would like to see Dr Marry Guan if possible

## 2018-12-23 NOTE — Telephone Encounter (Signed)
Disregard last message. Patient would like to see Dr Rudene Christians instead.

## 2018-12-23 NOTE — Telephone Encounter (Signed)
See unrouted message below/ 

## 2018-12-23 NOTE — Telephone Encounter (Signed)
Patient is in need of an Ortho and he would like to see: Dr. Hessie Knows w/[facility] [phone].

## 2018-12-24 ENCOUNTER — Other Ambulatory Visit: Payer: Self-pay | Admitting: *Deleted

## 2018-12-24 ENCOUNTER — Encounter: Payer: Self-pay | Admitting: Oncology

## 2018-12-24 ENCOUNTER — Other Ambulatory Visit: Payer: Self-pay

## 2018-12-24 DIAGNOSIS — C61 Malignant neoplasm of prostate: Secondary | ICD-10-CM

## 2018-12-24 NOTE — Progress Notes (Signed)
Patient stated that he would like his wife to come in to the negative pressure room since he is hard of hearing.

## 2018-12-24 NOTE — Telephone Encounter (Signed)
See other phone message.  Order placed for ortho referral.

## 2018-12-24 NOTE — Telephone Encounter (Signed)
Order placed for ortho referral.   

## 2018-12-25 ENCOUNTER — Other Ambulatory Visit: Payer: Self-pay

## 2018-12-25 ENCOUNTER — Inpatient Hospital Stay: Payer: Medicare HMO | Attending: Oncology

## 2018-12-25 ENCOUNTER — Inpatient Hospital Stay: Payer: Medicare HMO

## 2018-12-25 ENCOUNTER — Inpatient Hospital Stay: Payer: Medicare HMO | Admitting: Oncology

## 2018-12-25 DIAGNOSIS — C61 Malignant neoplasm of prostate: Secondary | ICD-10-CM

## 2018-12-25 DIAGNOSIS — C7951 Secondary malignant neoplasm of bone: Secondary | ICD-10-CM | POA: Insufficient documentation

## 2018-12-25 DIAGNOSIS — Z5111 Encounter for antineoplastic chemotherapy: Secondary | ICD-10-CM | POA: Insufficient documentation

## 2018-12-25 LAB — COMPREHENSIVE METABOLIC PANEL
ALT: 16 U/L (ref 0–44)
AST: 23 U/L (ref 15–41)
Albumin: 3.7 g/dL (ref 3.5–5.0)
Alkaline Phosphatase: 59 U/L (ref 38–126)
Anion gap: 8 (ref 5–15)
BUN: 19 mg/dL (ref 8–23)
CO2: 23 mmol/L (ref 22–32)
Calcium: 9 mg/dL (ref 8.9–10.3)
Chloride: 108 mmol/L (ref 98–111)
Creatinine, Ser: 1.07 mg/dL (ref 0.61–1.24)
GFR calc Af Amer: >60 mL/min (ref >60)
GFR calc non Af Amer: >60 mL/min (ref >60)
Glucose, Bld: 108 mg/dL — ABNORMAL HIGH (ref 70–99)
Potassium: 3.4 mmol/L — ABNORMAL LOW (ref 3.5–5.1)
Sodium: 139 mmol/L (ref 135–145)
Total Bilirubin: 0.7 mg/dL (ref 0.3–1.2)
Total Protein: 6.7 g/dL (ref 6.5–8.1)

## 2018-12-25 LAB — PSA: Prostatic Specific Antigen: <0.01 ng/mL (ref 0.00–4.00)

## 2018-12-25 LAB — CBC WITH DIFFERENTIAL/PLATELET
Abs Immature Granulocytes: 0.01 10*3/uL (ref 0.00–0.07)
Basophils Absolute: 0 10*3/uL (ref 0.0–0.1)
Basophils Relative: 1 %
Eosinophils Absolute: 0.4 10*3/uL (ref 0.0–0.5)
Eosinophils Relative: 7 %
HCT: 32.2 % — ABNORMAL LOW (ref 39.0–52.0)
Hemoglobin: 10.4 g/dL — ABNORMAL LOW (ref 13.0–17.0)
Immature Granulocytes: 0 %
Lymphocytes Relative: 27 %
Lymphs Abs: 1.4 10*3/uL (ref 0.7–4.0)
MCH: 26.5 pg (ref 26.0–34.0)
MCHC: 32.3 g/dL (ref 30.0–36.0)
MCV: 82.1 fL (ref 80.0–100.0)
Monocytes Absolute: 0.7 10*3/uL (ref 0.1–1.0)
Monocytes Relative: 14 %
Neutro Abs: 2.7 10*3/uL (ref 1.7–7.7)
Neutrophils Relative %: 51 %
Platelets: 231 10*3/uL (ref 150–400)
RBC: 3.92 MIL/uL — ABNORMAL LOW (ref 4.22–5.81)
RDW: 14.9 % (ref 11.5–15.5)
WBC: 5.2 10*3/uL (ref 4.0–10.5)
nRBC: 0 % (ref 0.0–0.2)

## 2018-12-25 MED ORDER — LEUPROLIDE ACETATE (3 MONTH) 22.5 MG ~~LOC~~ KIT
22.5000 mg | PACK | Freq: Once | SUBCUTANEOUS | Status: AC
  Administered 2018-12-25: 22.5 mg via SUBCUTANEOUS
  Filled 2018-12-25: qty 22.5

## 2018-12-25 NOTE — Assessment & Plan Note (Addendum)
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 Bone scan October 2020-stable positive response.  Patient and wife have spoke to Dr. Rogue Bussing regarding results.  # Continue Zytiga 250 mg once a day with food.  No major side effects noted.  Continue Eligard every 3 months.  If PSA worsening then recommend further imaging.  PSA from today is stable and is less than 0.01.  #Labile blood pressures-stable as per patient  # Dizzy-does not complain of this today.  Previously evaluated by Dr. Manuella Ghazi for dizziness.  MRI of brain was negative.  #A. fib paroxysmal-currently in sinus rhythm.  Not on anticoagulation.  Aspirin Plavix.  Stable  #Right hip pain-Dr. Nicki Reaper, patient's PCP referred to orthopedics.  Has not been seen.  Awaiting appointment.  We will touch base to see if appointment is scheduled.  #Spoke to patient's wife during our visit via [place].  All questions answered.  # DISPOSITION:  #Eligard today # follow up in 3 months; Eligard/ labs-cbc/cmp/PSA

## 2018-12-25 NOTE — Progress Notes (Signed)
[place] OFFICE PROGRESS NOTE  Patient Care Team: Einar Pheasant, MD as PCP - General (Internal Medicine)  Cancer Staging No matching staging information was found for the patient.   Oncology History Overview Note  Nov 2017- completed IM RT radiation therapy to his prostate and pelvic nodes for Gleason 7 (4+3) adenocarcinoma the prostate presenting the PSA of 7.8.  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 Fib/injection site pain  # May 23rd Zytiga 279m/day; no prednisone   # OBenita Stabile [place]]; Oct 2019- A.fib/not on eliquis; Dr.Khan/[facility].   ------------------------------------------------------------------  DIAGNOSIS: [ JAN 2019]- Met- PROSTATE CANCER  STAGE:  IV       ;GOALS: PALLIATIVE  CURRENT/MOST RECENT THERAPY [ march 2019]- Lupron; May 23rd 2019- Zytiga 2557mday    Malignant neoplasm of prostate ([place]     INTERVAL HISTORY:  Nathaniel [id number].[id number].  male pleasant patient above history of metastatic prostate cancer on ADT/Zytiga is here for follow-up.  Today, patient states he is doing well.  Recently completed a bone scan which revealed increased activity in the mid thoracic and lumbar spine.  Focal areas of increased activity in bilateral sacrum and less acetabulum are no longer visualized.  This suggests good response to treatment.  PSA continues to trend down.  Complains of intermittent right hip pain.  He is seeing an orthopedic physician for possible cortisone injections.  No nausea no vomiting.  Appetite is good.  He is trying to lose weight.  No swelling in the legs.  Review of Systems  Constitutional: Negative.  Negative for chills, fever, malaise/fatigue and weight loss.  HENT: Negative for congestion, ear pain and tinnitus.   Eyes: Negative.  Negative  for blurred vision and double vision.  Respiratory: Negative.  Negative for cough, sputum production and shortness of breath.   Cardiovascular: Negative.  Negative for chest pain, palpitations and leg swelling.  Gastrointestinal: Negative.  Negative for abdominal pain, constipation, diarrhea, nausea and vomiting.  Genitourinary: Negative for dysuria, frequency and urgency.  Musculoskeletal: Positive for joint pain (Right hip). Negative for back pain and falls.  Skin: Negative.  Negative for rash.  Neurological: Negative.  Negative for weakness and headaches.  Endo/Heme/Allergies: Negative.  Does not bruise/bleed easily.  Psychiatric/Behavioral: Negative.  Negative for depression. The patient is not nervous/anxious and does not have insomnia.      PAST MEDICAL HISTORY :  Past Medical History:  Diagnosis Date  . Allergic rhinitis   . Barrett's esophagus 10/21/13  . Bone cancer ([facility]  . Chronic kidney disease    stones  . Colon polyp, hyperplastic   . COPD (chronic obstructive pulmonary disease) (HCC)    mild COPD. former smoker  . Coronary artery disease   . Diverticulosis   . Fundic gland polyps of stomach, benign   . Gastritis   . GERD (gastroesophageal reflux disease)   . Hypercholesteremia   . Hypertension   . Prostate cancer ([place]  . Prostatism   . Reflux esophagitis   . Renal stones   . Skin cancer    left cheek/lesion excised  . Skin cancer   . Sleep apnea   . TIA (transient ischemic attack)   . TIA (transient ischemic attack)     PAST SURGICAL HISTORY :   Past Surgical History:  Procedure Laterality Date  . CARDIAC CATHETERIZATION    . COLONOSCOPY WITH PROPOFOL N/A 09/13/2015   Procedure: COLONOSCOPY WITH PROPOFOL;  Surgeon: Lollie Sails, MD;  Location: [facility] ENDOSCOPY;  Service: Endoscopy;  Laterality: N/A;  . CORONARY ANGIOPLASTY    . ESOPHAGOGASTRODUODENOSCOPY (EGD) WITH PROPOFOL N/A 09/13/2015   Procedure: ESOPHAGOGASTRODUODENOSCOPY (EGD) WITH PROPOFOL;   Surgeon: Lollie Sails, MD;  [facility]COPY;  Service: Endoscopy;  Laterality: N/A;  . ESOPHAGOGASTRODUODENOSCOPY (EGD) WITH PROPOFOL N/A 12/28/2015   Procedure: ESOPHAGOGASTRODUODENOSCOPY (EGD) WITH PROPOFOL;  Surgeon: Lollie Sails, MD;  Location: [facility] ENDOSCOPY;  Service: Endoscopy;  Laterality: N/A;  . ESOPHAGOGASTRODUODENOSCOPY (EGD) WITH PROPOFOL N/A 06/16/2016   Procedure: ESOPHAGOGASTRODUODENOSCOPY (EGD) WITH PROPOFOL;  Surgeon: Lollie Sails, MD;  Location: [facility] ENDOSCOPY;  Service: Endoscopy;  Laterality: N/A;  . EYE SURGERY  2013   CATARACT EXTRACTION  . GANGLION CYST EXCISION Right 05/18/2017   Procedure: REMOVAL GANGLION CYST ANKLE;  Surgeon: Samara Deist, DPM;  Location: [facility] ORS;  Service: Podiatry;  Laterality: Right;  . heart cath stent    . LEFT HEART CATH AND CORONARY ANGIOGRAPHY Right 12/03/2017   Procedure: Left Heart Cath and Coronary Angiography with possible coronary intervention;  Surgeon: Dionisio David, MD;  Location: [place] CV LAB;  Service: Cardiovascular;  Laterality: Right;  . PROSTATE BIOPSY    . stents     multiple    FAMILY HISTORY :   Family History  Problem Relation Age of Onset  . Cancer Mother        gastric and lung  . Cancer Father        multiple myeloma  . Stroke Father   . Cancer Sister        leukemia  . Cancer Brother        leukemia  . Cancer Brother        kidney  . Cancer Daughter        Uterine  . Cancer Other        [place] (Sister's Son): Prostate    SOCIAL HISTORY:   Social History   Tobacco Use  . Smoking status: Former Smoker    Packs/day: 1.00    Years: 35.00    Pack years: 35.00    Types: Cigarettes    Quit date: 03/06/1986    Years since quitting: 32.8  . Smokeless tobacco: Former Systems developer    Types: Chew  Substance Use Topics  . Alcohol use: No  . Drug use: No    ALLERGIES:  is allergic to atorvastatin.  MEDICATIONS:  Current Outpatient Medications  Medication Sig Dispense Refill   . abiraterone acetate (ZYTIGA) 250 MG tablet TAKE 1 TABLET (250 MG TOTAL) BY MOUTH DAILY. 30 tablet 4  . amLODipine (NORVASC) 2.5 MG tablet TAKE 1 TABLET BY MOUTH ONCE DAILY 90 tablet 1  . aspirin EC 81 MG tablet Take 81 mg by mouth daily.    . calcium citrate-vitamin D (CITRACAL+D) 315-200 MG-UNIT tablet Take 1 tablet by mouth daily.    . cholecalciferol (VITAMIN D) 1000 units tablet Take 1,000 Units by mouth daily.     . clopidogrel (PLAVIX) 75 MG tablet Take 1 tablet (75 mg total) by mouth daily. 90 tablet 0  . fexofenadine (ALLEGRA) 180 MG tablet Take 180 mg by mouth daily as needed for allergies or rhinitis.    . fluticasone (FLONASE) 50 MCG/ACT nasal spray Place 2 sprays into both nostrils daily as needed for allergies.     . hydrochlorothiazide (  HYDRODIURIL) 12.5 MG tablet TAKE 1 TABLET BY MOUTH ONCE EVERY MORNING 90 tablet 1  . Ipratropium-Albuterol (COMBIVENT) 20-100 MCG/ACT AERS respimat 2 puffs qid prn (Patient not taking: Reported on 12/30/2018) 1 Inhaler 3  . isosorbide mononitrate (IMDUR) 30 MG 24 hr tablet TAKE 1 TABLET BY MOUTH ONCE DAILY 90 tablet 2  . losartan (COZAAR) 50 MG tablet Take 1 tablet (50 mg total) by mouth daily. 90 tablet 1  . lovastatin (MEVACOR) 40 MG tablet TAKE 2 TABLETS BY MOUTH AT BEDTIME 180 tablet 1  . Multiple Vitamin (MULTI-VITAMINS) TABS Take 1 tablet by mouth daily.     . nitroGLYCERIN (NITROSTAT) 0.4 MG SL tablet Place 0.4 mg under the tongue every 5 (five) minutes as needed for chest pain.    . pantoprazole (PROTONIX) 40 MG tablet Take 40 mg by mouth 2 (two) times daily.     [name] amiodarone (PACERONE) 400 MG tablet Take 1 tablet (400 mg total) by mouth daily. (Patient not taking: Reported on 12/24/2018) 30 tablet 0   No current facility-administered medications for this visit.     PHYSICAL EXAMINATION: ECOG PERFORMANCE STATUS: 0 - Asymptomatic  BP (!) 161/78 (BP Location: Right Arm, Patient Position: Sitting)   Pulse 74   Temp 98 F (36.7 C)  (Tympanic)   Resp 20   Wt 213 lb (96.6 kg)   BMI 30.56 kg/m   Filed Weights   12/25/18 1051  Weight: 213 lb (96.6 kg)    Physical Exam  Constitutional: He is oriented to person, place, and time and well-developed, well-nourished, and in no distress.  He is alone.  Is walking himself.  HENT:  Head: Normocephalic and atraumatic.  Mouth/Throat: Oropharynx is clear and moist. No oropharyngeal exudate.  Eyes: Pupils are equal, round, and reactive to light.  Neck: Normal range of motion. Neck supple.  Cardiovascular: Normal rate and regular rhythm.  Pulmonary/Chest: No respiratory distress. He has no wheezes.  Abdominal: Soft. Bowel sounds are normal. He exhibits no distension and no mass. There is no abdominal tenderness. There is no rebound and no guarding.  Musculoskeletal: Normal range of motion.        General: No tenderness or edema.  Neurological: He is alert and oriented to person, place, and time.  Skin: Skin is warm.  Psychiatric: Affect normal.      LABORATORY DATA:  I have reviewed the data as listed    Component Value Date/Time   NA 139 12/25/2018 1026   NA 140 09/16/2013 0438   K 3.4 (L) 12/25/2018 1026   K 4.2 09/16/2013 0438   CL 108 12/25/2018 1026   CL 109 (H) 09/16/2013 0438   CO2 23 12/25/2018 1026   CO2 24 09/16/2013 0438   GLUCOSE 108 (H) 12/25/2018 1026   GLUCOSE 101 (H) 09/16/2013 0438   BUN 19 12/25/2018 1026   BUN 20 (H) 09/16/2013 0438   CREATININE 1.07 12/25/2018 1026   CREATININE 1.28 09/16/2013 0438   CALCIUM 9.0 12/25/2018 1026   CALCIUM 8.3 (L) 09/16/2013 0438   PROT 6.7 12/25/2018 1026   PROT 6.7 09/15/2013 0755   ALBUMIN 3.7 12/25/2018 1026   ALBUMIN 3.5 09/15/2013 0755   AST 23 12/25/2018 1026   AST 37 09/15/2013 0755   ALT 16 12/25/2018 1026   ALT 28 09/15/2013 0755   ALKPHOS 59 12/25/2018 1026   ALKPHOS 44 (L) 09/15/2013 0755   BILITOT 0.7 12/25/2018 1026   BILITOT 0.5 09/15/2013 0755   GFRNONAA >60 12/25/2018 1026  GFRNONAA 54 (L) 09/16/2013 0438   GFRAA >60 12/25/2018 1026   GFRAA >60 09/16/2013 0438    No results found for: SPEP, UPEP  Lab Results  Component Value Date   WBC 5.2 12/25/2018   NEUTROABS 2.7 12/25/2018   HGB 10.4 (L) 12/25/2018   HCT 32.2 (L) 12/25/2018   MCV 82.1 12/25/2018   PLT 231 12/25/2018      Chemistry      Component Value Date/Time   NA 139 12/25/2018 1026   NA 140 09/16/2013 0438   K 3.4 (L) 12/25/2018 1026   K 4.2 09/16/2013 0438   CL 108 12/25/2018 1026   CL 109 (H) 09/16/2013 0438   CO2 23 12/25/2018 1026   CO2 24 09/16/2013 0438   BUN 19 12/25/2018 1026   BUN 20 (H) 09/16/2013 0438   CREATININE 1.07 12/25/2018 1026   CREATININE 1.28 09/16/2013 0438      Component Value Date/Time   CALCIUM 9.0 12/25/2018 1026   CALCIUM 8.3 (L) 09/16/2013 0438   ALKPHOS 59 12/25/2018 1026   ALKPHOS 44 (L) 09/15/2013 0755   AST 23 12/25/2018 1026   AST 37 09/15/2013 0755   ALT 16 12/25/2018 1026   ALT 28 09/15/2013 0755   BILITOT 0.7 12/25/2018 1026   BILITOT 0.5 09/15/2013 0755       RADIOGRAPHIC STUDIES: I have personally reviewed the radiological images as listed and agreed with the findings in the report. No results found.   ASSESSMENT & PLAN:  Malignant neoplasm of prostate ([place]) # Castrate sensitive-metastatic prostate cancer to the bone. Stage IV-on ADT/Zytiga low-dose. Bone scan October 2020-stable positive response.  Patient and wife have spoke to Dr. Rogue Bussing regarding results.  # Continue Zytiga 250 mg once a day with food.  No major side effects noted.  Continue Eligard every 3 months.  If PSA worsening then recommend further imaging.  PSA from today is stable and is less than 0.01.  #Labile blood pressures-stable as per patient  # Dizzy-does not complain of this today.  Previously evaluated by Dr. Manuella Ghazi for dizziness.  MRI of brain was negative.  #A. fib paroxysmal-currently in sinus rhythm.  Not on anticoagulation.  Aspirin Plavix.   Stable  #Right hip pain-Dr. Nicki Reaper, patient's PCP referred to orthopedics.  Has not been seen.  Awaiting appointment.  #Spoke to patient's wife during our visit via [place].  All questions answered.  # DISPOSITION:  #Eligard today # follow up in 3 months; Eligard/ labs-cbc/cmp/PSA    Orders Placed This Encounter  Procedures  . CBC with Differential    Standing Status:   Future    Standing Expiration Date:   12/25/2019  . Comprehensive metabolic panel    Standing Status:   Future    Standing Expiration Date:   12/25/2019  . PSA    Standing Status:   Future    Standing Expiration Date:   12/25/2019   All questions were answered. The patient knows to call the clinic with any problems, questions or concerns.      Jacquelin Hawking, NP 12/30/2018 9:08 AM

## 2018-12-30 ENCOUNTER — Other Ambulatory Visit: Payer: Self-pay

## 2018-12-30 ENCOUNTER — Encounter (INDEPENDENT_AMBULATORY_CARE_PROVIDER_SITE_OTHER): Payer: Self-pay | Admitting: Vascular Surgery

## 2018-12-30 ENCOUNTER — Ambulatory Visit (INDEPENDENT_AMBULATORY_CARE_PROVIDER_SITE_OTHER): Payer: Medicare HMO | Admitting: Vascular Surgery

## 2018-12-30 VITALS — BP 184/88 | HR 78 | Resp 16 | Ht 70.0 in | Wt 215.0 lb

## 2018-12-30 DIAGNOSIS — I10 Essential (primary) hypertension: Secondary | ICD-10-CM

## 2018-12-30 DIAGNOSIS — I4891 Unspecified atrial fibrillation: Secondary | ICD-10-CM | POA: Diagnosis not present

## 2018-12-30 DIAGNOSIS — J449 Chronic obstructive pulmonary disease, unspecified: Secondary | ICD-10-CM | POA: Diagnosis not present

## 2018-12-30 DIAGNOSIS — I724 Aneurysm of artery of lower extremity: Secondary | ICD-10-CM | POA: Insufficient documentation

## 2018-12-30 DIAGNOSIS — I779 Disorder of arteries and arterioles, unspecified: Secondary | ICD-10-CM | POA: Diagnosis not present

## 2018-12-30 DIAGNOSIS — Z8679 Personal history of other diseases of the circulatory system: Secondary | ICD-10-CM | POA: Insufficient documentation

## 2018-12-30 DIAGNOSIS — K219 Gastro-esophageal reflux disease without esophagitis: Secondary | ICD-10-CM | POA: Insufficient documentation

## 2018-12-30 DIAGNOSIS — I714 Abdominal aortic aneurysm, without rupture, unspecified: Secondary | ICD-10-CM

## 2018-12-30 DIAGNOSIS — I2510 Atherosclerotic heart disease of native coronary artery without angina pectoris: Secondary | ICD-10-CM | POA: Diagnosis not present

## 2018-12-30 NOTE — Progress Notes (Signed)
MRN : [id number]  Nathaniel Thompson is a 83 y.o. (Mar 02, 1936) male who presents with chief complaint of No chief complaint on file. [name]  History of Present Illness:   The patient presents to the office for evaluation of an abdominal aortic aneurysm. The aneurysm was found incidentally by plain films for low back pain. Patient denies abdominal pain or unusual back pain, no other abdominal complaints.  He does have a bony met from his prostate cancer at L3.  No history of an acute onset of painful blue discoloration of the toes.     No family history of AAA.   Patient denies amaurosis fugax or TIA symptoms. There is no history of claudication or rest pain symptoms of the lower extremities.  He does have pain in the right leg when he walks but it radiates from posterior to laterally down the leg and occurs with laying down in bed tooThe patient denies angina or shortness of breath.  Plain films of the LS spine shows an AAA that measures 5.00 cm  No outpatient medications have been marked as taking for the 12/30/18 encounter (Appointment) with Delana Meyer, Dolores Lory, MD.    Past Medical History:  Diagnosis Date  . Allergic rhinitis   . Barrett's esophagus 10/21/13  . Bone cancer ([place])   . Chronic kidney disease    stones  . Colon polyp, hyperplastic   . COPD (chronic obstructive pulmonary disease) (HCC)    mild COPD. former smoker  . Coronary artery disease   . Diverticulosis   . Fundic gland polyps of stomach, benign   . Gastritis   . GERD (gastroesophageal reflux disease)   . Hypercholesteremia   . Hypertension   . Prostate cancer ([place])   . Prostatism   . Reflux esophagitis   . Renal stones   . Skin cancer    left cheek/lesion excised  . Skin cancer   . Sleep apnea   . TIA (transient ischemic attack)   . TIA (transient ischemic attack)     Past Surgical History:  Procedure Laterality Date  . CARDIAC CATHETERIZATION    . COLONOSCOPY WITH PROPOFOL N/A 09/13/2015   Procedure:  COLONOSCOPY WITH PROPOFOL;  Surgeon: Lollie Sails, MD;  Location: [facility] ENDOSCOPY;  Service: Endoscopy;  Laterality: N/A;  . CORONARY ANGIOPLASTY    . ESOPHAGOGASTRODUODENOSCOPY (EGD) WITH PROPOFOL N/A 09/13/2015   Procedure: ESOPHAGOGASTRODUODENOSCOPY (EGD) WITH PROPOFOL;  Surgeon: Lollie Sails, MD;  Location: [facility] ENDOSCOPY;  Service: Endoscopy;  Laterality: N/A;  . ESOPHAGOGASTRODUODENOSCOPY (EGD) WITH PROPOFOL N/A 12/28/2015   Procedure: ESOPHAGOGASTRODUODENOSCOPY (EGD) WITH PROPOFOL;  Surgeon: Lollie Sails, MD;  Location: [facility] ENDOSCOPY;  Service: Endoscopy;  Laterality: N/A;  . ESOPHAGOGASTRODUODENOSCOPY (EGD) WITH PROPOFOL N/A 06/16/2016   Procedure: ESOPHAGOGASTRODUODENOSCOPY (EGD) WITH PROPOFOL;  Surgeon: Lollie Sails, MD;  Location: [facility] ENDOSCOPY;  Service: Endoscopy;  Laterality: N/A;  . EYE SURGERY  2013   CATARACT EXTRACTION  . GANGLION CYST EXCISION Right 05/18/2017   Procedure: REMOVAL GANGLION CYST ANKLE;  Surgeon: Samara Deist, DPM;  Location: [facility] ORS;  Service: Podiatry;  Laterality: Right;  . heart cath stent    . LEFT HEART CATH AND CORONARY ANGIOGRAPHY Right 12/03/2017   Procedure: Left Heart Cath and Coronary Angiography with possible coronary intervention;  Surgeon: Dionisio David, MD;  Location: [facility];  Service: Cardiovascular;  Laterality: Right;  . PROSTATE BIOPSY    . stents     multiple    Social History Social History  Tobacco Use  . Smoking status: Former Smoker    Packs/day: 1.00    Years: 35.00    Pack years: 35.00    Types: Cigarettes    Quit date: 03/06/1986    Years since quitting: 32.8  . Smokeless tobacco: Former Systems developer    Types: Chew  Substance Use Topics  . Alcohol use: No  . Drug use: No    Family History Family History  Problem Relation Age of Onset  . Cancer Mother        gastric and lung  . Cancer Father        multiple myeloma  . Stroke Father   . Cancer Sister        leukemia  . Cancer Brother         leukemia  . Cancer Brother        kidney  . Cancer Daughter        Uterine  . Cancer Other        [place] Son): Prostate  No family history of bleeding/clotting disorders, porphyria or autoimmune disease   Allergies  Allergen Reactions  . Atorvastatin Other (See Comments)    Achy joints     REVIEW OF SYSTEMS (Negative unless checked)  Constitutional: '[]' Weight loss  '[]' Fever  '[]' Chills Cardiac: '[]' Chest pain   '[]' Chest pressure   '[]' Palpitations   '[]' Shortness of breath when laying flat   '[]' Shortness of breath with exertion. Vascular:  '[x]' Pain in legs with walking   '[x]' Pain in legs at rest  '[]' History of DVT   '[]' Phlebitis   '[x]' Swelling in legs   '[]' Varicose veins   '[]' Non-healing ulcers Pulmonary:   '[]' Uses home oxygen   '[]' Productive cough   '[]' Hemoptysis   '[]' Wheeze  '[x]' COPD   '[]' Asthma Neurologic:  '[]' Dizziness   '[]' Seizures   '[]' History of stroke   '[]' History of TIA  '[]' Aphasia   '[]' Vissual changes   '[]' Weakness or numbness in arm   '[x]' Weakness or numbness in leg Musculoskeletal:   '[]' Joint swelling   '[x]' Joint pain   '[x]' Low back pain Hematologic:  '[]' Easy bruising  '[]' Easy bleeding   '[]' Hypercoagulable state   '[]' Anemic Gastrointestinal:  '[]' Diarrhea   '[]' Vomiting  '[]' Gastroesophageal reflux/heartburn   '[]' Difficulty swallowing. Genitourinary:  '[]' Chronic kidney disease   '[]' Difficult urination  '[]' Frequent urination   '[]' Blood in urine Skin:  '[]' Rashes   '[]' Ulcers  Psychological:  '[]' History of anxiety   '[]'  History of major depression.  Physical Examination  There were no vitals filed for this visit. There is no height or weight on file to calculate BMI. Gen: WD/WN, NAD Head: [place]/AT, No temporalis wasting.  Ear/Nose/Throat: Hearing grossly intact, nares w/o erythema or drainage, poor dentition Eyes: PER, EOMI, sclera nonicteric.  Neck: Supple, no masses.  No bruit or JVD.  Pulmonary:  Good air movement, clear to auscultation bilaterally, no use of accessory muscles.  Cardiac: RRR, normal S1,  S2, no Murmurs. Vascular: enlarged popliteal pulses bilaterally no carotid bruitsscattered varicosities present bilaterally.  Mild venous stasis changes to the legs bilaterally right > left.  2+ soft pitting edema Vessel Right Left  Radial Palpable Palpable  Carotid Palpable Palpable  Popliteal Palpable Palpable  PT Palpable Palpable  DP Palpable Palpable   Gastrointestinal: soft, non-distended. No guarding/no peritoneal signs.  Musculoskeletal: M/S 5/5 throughout.  No deformity or atrophy.  Neurologic: CN 2-12 intact. Pain and light touch intact in extremities.  Symmetrical.  Speech is fluent. Motor exam as listed above. Psychiatric: Judgment intact, Mood & affect appropriate for pt's clinical  situation. Dermatologic: No rashes or ulcers noted.  No changes consistent with cellulitis. Lymph : No Cervical lymphadenopathy, no lichenification or skin changes of chronic lymphedema.  CBC Lab Results  Component Value Date   WBC 5.2 12/25/2018   HGB 10.4 (L) 12/25/2018   HCT 32.2 (L) 12/25/2018   MCV 82.1 12/25/2018   PLT 231 12/25/2018    BMET    Component Value Date/Time   NA 139 12/25/2018 1026   NA 140 09/16/2013 0438   K 3.4 (L) 12/25/2018 1026   K 4.2 09/16/2013 0438   CL 108 12/25/2018 1026   CL 109 (H) 09/16/2013 0438   CO2 23 12/25/2018 1026   CO2 24 09/16/2013 0438   GLUCOSE 108 (H) 12/25/2018 1026   GLUCOSE 101 (H) 09/16/2013 0438   BUN 19 12/25/2018 1026   BUN 20 (H) 09/16/2013 0438   CREATININE 1.07 12/25/2018 1026   CREATININE 1.28 09/16/2013 0438   CALCIUM 9.0 12/25/2018 1026   CALCIUM 8.3 (L) 09/16/2013 0438   GFRNONAA >60 12/25/2018 1026   GFRNONAA 54 (L) 09/16/2013 0438   GFRAA >60 12/25/2018 1026   GFRAA >60 09/16/2013 0438   Estimated Creatinine Clearance: 63.1 mL/min (by C-G formula based on SCr of 1.07 mg/dL).  COAG No results found for: INR, PROTIME  Radiology Nm Bone Scan Whole Body  Result Date: 12/19/2018 CLINICAL DATA:  Prostate cancer.   Metastatic disease. EXAM: NUCLEAR MEDICINE WHOLE BODY BONE SCAN TECHNIQUE: Whole body anterior and posterior images were obtained approximately 3 hours after intravenous injection of radiopharmaceutical. RADIOPHARMACEUTICALS:  23.8 mCi Technetium-27mMDP IV COMPARISON:  Bone scan 05/20/2018 FINDINGS: Bilateral renal function. Previously identified focal areas of increased activity in the midthoracic and mid lumbar spine remain. Focal areas of previously identified increased activity in the bilateral sacrum and left acetabulum are no longer visualized. This suggest response to treatment. Activity noted the shoulders, knees, and ankles are consistent degenerative change. IMPRESSION: Previously identified focal areas of increased activity in the midthoracic and lumbar spine remain. Previously identified focal areas of increased activity in the bilateral sacrum and left acetabulum no longer visualized. This suggest response to treatment. No new abnormalities identified. Electronically Signed   By: TAudubon  On: 12/19/2018 07:12     Assessment/Plan 1. Abdominal aortic aneurysm (AAA) without rupture ([place] No surgery or intervention at this time. The patient has an asymptomatic abdominal aortic aneurysm that is greater than 4 cm but less than 5 cm in maximal diameter.  I have discussed the natural history of abdominal aortic aneurysm and the small risk of rupture for aneurysm less than 5 cm in size.  However, as these small aneurysms tend to enlarge over time, continued surveillance with ultrasound or CT scan is mandatory.  I have also discussed optimizing medical management with hypertension and lipid control and the importance of abstinence from tobacco.  The patient is also encouraged to exercise a minimum of 30 minutes 4 times a week.  Should the patient develop new onset abdominal or back pain or signs of peripheral embolization they are instructed to seek medical attention immediately and to alert  the physician providing care that they have an aneurysm.  The patient voices their understanding. I have scheduled the patient to return in 1 months with an aortic duplex. - VAS [place]ORTA/IVC/ILIACS; Future  2. Popliteal artery aneurysm (HCC) No surgery or intervention at this time.  The patient has an asymptomatic popliteal artery aneurysm that is less than 2.5 cm in  maximal diameter.  I have discussed the natural history of popliteal aneurysm and the small risk of thrombosis for aneurysm less than 2.5 cm in size.  However, as these small aneurysms tend to enlarge over time, continued surveillance with ultrasound is mandatory.   I have also discussed optimizing medical management with hypertension and lipid control and the importance of abstinence from tobacco.  The patient is also encouraged to exercise a minimum of 30 minutes 4 times a week.   Should the patient develop new leg pain or signs of peripheral embolization they are instructed to seek medical attention immediately and to alert the physician providing care that they have an aneurysm.  The patient voices their understanding.  - VAS [place] LOWER EXTREMITY ARTERIAL DUPLEX; Future  3. Carotid artery disease, unspecified laterality, unspecified type ([place]) Recommend:  Given the patient's asymptomatic subcritical stenosis no further invasive testing or surgery at this time.  Duplex ultrasound is over due.  Continue antiplatelet therapy as prescribed Continue management of CAD, HTN and Hyperlipidemia Healthy heart diet,  encouraged exercise at least 4 times per week Follow up in 1 months with duplex ultrasound and physical exam  - VAS US CAROTID; Future  4. Atrial fibrillation, new onset ([place]) Continue antiarrhythmia medications as already ordered, these medications have been reviewed and there are no changes at this time.  Continue anticoagulation as ordered by Cardiology Service   5. Coronary artery disease involving native  coronary artery of native heart without angina pectoris Continue cardiac and antihypertensive medications as already ordered and reviewed, no changes at this time.  Continue statin as ordered and reviewed, no changes at this time  Nitrates PRN for chest pain   6. Hypertension, essential Continue antihypertensive medications as already ordered, these medications have been reviewed and there are no changes at this time.   7. Chronic obstructive pulmonary disease, unspecified COPD type ([place]) Continue pulmonary medications and aerosols as already ordered, these medications have been reviewed and there are no changes at this time.      Hortencia Pilar, MD  12/30/2018 8:25 AM

## 2018-12-31 ENCOUNTER — Other Ambulatory Visit: Payer: Self-pay | Admitting: Internal Medicine

## 2019-01-05 ENCOUNTER — Telehealth: Payer: Self-pay | Admitting: Internal Medicine

## 2019-01-05 NOTE — Telephone Encounter (Signed)
-----   Message from Cammie Sickle, MD sent at 01/05/2019  6:20 AM EST ----- Sonia Baller- Thank you for seeing the patient. Shingrix vaccine should be ok. Please inform pt/wife. GB ----- Message ----- From: Jacquelin Hawking, NP Sent: 12/31/2018   9:05 AM EST To: Cammie Sickle, MD  Good morning Dr. Rogue Bussing,   This patient is wondering about a shingles vaccine.  Dr. Nicki Reaper asked that he ask you prior to administration.   Currently on Zytiga and Eligard with great response.  PSA less than 0.01.  Bone scan appeared improved.  I will be happy to call them back with the answer.  Faythe Casa, NP 12/31/2018 9:07 AM

## 2019-01-13 DIAGNOSIS — M533 Sacrococcygeal disorders, not elsewhere classified: Secondary | ICD-10-CM | POA: Diagnosis not present

## 2019-01-13 DIAGNOSIS — M47818 Spondylosis without myelopathy or radiculopathy, sacral and sacrococcygeal region: Secondary | ICD-10-CM | POA: Diagnosis not present

## 2019-01-13 DIAGNOSIS — I714 Abdominal aortic aneurysm, without rupture: Secondary | ICD-10-CM | POA: Diagnosis not present

## 2019-01-16 DIAGNOSIS — E785 Hyperlipidemia, unspecified: Secondary | ICD-10-CM | POA: Diagnosis not present

## 2019-01-16 DIAGNOSIS — R569 Unspecified convulsions: Secondary | ICD-10-CM | POA: Diagnosis not present

## 2019-01-16 DIAGNOSIS — I739 Peripheral vascular disease, unspecified: Secondary | ICD-10-CM | POA: Diagnosis not present

## 2019-01-16 DIAGNOSIS — R0602 Shortness of breath: Secondary | ICD-10-CM | POA: Diagnosis not present

## 2019-01-16 DIAGNOSIS — Z9989 Dependence on other enabling machines and devices: Secondary | ICD-10-CM | POA: Diagnosis not present

## 2019-01-16 DIAGNOSIS — R55 Syncope and collapse: Secondary | ICD-10-CM | POA: Diagnosis not present

## 2019-01-16 DIAGNOSIS — I2510 Atherosclerotic heart disease of native coronary artery without angina pectoris: Secondary | ICD-10-CM | POA: Diagnosis not present

## 2019-01-16 DIAGNOSIS — K219 Gastro-esophageal reflux disease without esophagitis: Secondary | ICD-10-CM | POA: Diagnosis not present

## 2019-01-16 DIAGNOSIS — G4733 Obstructive sleep apnea (adult) (pediatric): Secondary | ICD-10-CM | POA: Diagnosis not present

## 2019-01-16 DIAGNOSIS — I10 Essential (primary) hypertension: Secondary | ICD-10-CM | POA: Diagnosis not present

## 2019-01-16 DIAGNOSIS — G459 Transient cerebral ischemic attack, unspecified: Secondary | ICD-10-CM | POA: Diagnosis not present

## 2019-01-16 DIAGNOSIS — G4730 Sleep apnea, unspecified: Secondary | ICD-10-CM | POA: Diagnosis not present

## 2019-01-16 DIAGNOSIS — R42 Dizziness and giddiness: Secondary | ICD-10-CM | POA: Diagnosis not present

## 2019-01-20 ENCOUNTER — Encounter (INDEPENDENT_AMBULATORY_CARE_PROVIDER_SITE_OTHER): Payer: Self-pay | Admitting: Vascular Surgery

## 2019-01-20 ENCOUNTER — Ambulatory Visit (INDEPENDENT_AMBULATORY_CARE_PROVIDER_SITE_OTHER): Payer: Medicare HMO

## 2019-01-20 ENCOUNTER — Ambulatory Visit (INDEPENDENT_AMBULATORY_CARE_PROVIDER_SITE_OTHER): Payer: Medicare HMO | Admitting: Vascular Surgery

## 2019-01-20 ENCOUNTER — Other Ambulatory Visit: Payer: Self-pay

## 2019-01-20 VITALS — BP 123/64 | HR 80 | Resp 16 | Wt 207.8 lb

## 2019-01-20 DIAGNOSIS — E785 Hyperlipidemia, unspecified: Secondary | ICD-10-CM | POA: Diagnosis not present

## 2019-01-20 DIAGNOSIS — I4891 Unspecified atrial fibrillation: Secondary | ICD-10-CM | POA: Diagnosis not present

## 2019-01-20 DIAGNOSIS — I724 Aneurysm of artery of lower extremity: Secondary | ICD-10-CM | POA: Diagnosis not present

## 2019-01-20 DIAGNOSIS — I779 Disorder of arteries and arterioles, unspecified: Secondary | ICD-10-CM

## 2019-01-20 DIAGNOSIS — I10 Essential (primary) hypertension: Secondary | ICD-10-CM

## 2019-01-20 DIAGNOSIS — I714 Abdominal aortic aneurysm, without rupture, unspecified: Secondary | ICD-10-CM

## 2019-01-20 DIAGNOSIS — G4730 Sleep apnea, unspecified: Secondary | ICD-10-CM | POA: Diagnosis not present

## 2019-01-20 DIAGNOSIS — E7800 Pure hypercholesterolemia, unspecified: Secondary | ICD-10-CM

## 2019-01-20 DIAGNOSIS — K219 Gastro-esophageal reflux disease without esophagitis: Secondary | ICD-10-CM | POA: Diagnosis not present

## 2019-01-20 DIAGNOSIS — I739 Peripheral vascular disease, unspecified: Secondary | ICD-10-CM | POA: Diagnosis not present

## 2019-01-20 DIAGNOSIS — R0602 Shortness of breath: Secondary | ICD-10-CM | POA: Diagnosis not present

## 2019-01-20 DIAGNOSIS — I2510 Atherosclerotic heart disease of native coronary artery without angina pectoris: Secondary | ICD-10-CM

## 2019-01-20 MED FILL — ABIRATERONE ACETATE 250 MG: 250 | 30 days supply | Qty: 30 | Fill #3

## 2019-01-20 NOTE — Progress Notes (Signed)
MRN : [id number]  Nathaniel Thompson is a 83 y.o. (1935-08-18) male who presents with chief complaint of  Chief Complaint  Patient presents with   Follow-up    ultrasound follow up  .  History of Present Illness:   The patient presents to the office for evaluation of an abdominal aortic aneurysm. The aneurysm was found incidentally by plain films for low back pain. Patient denies abdominal pain or unusual back pain, no other abdominal complaints.  He does have a bony met from his prostate cancer at L3.  No history of an acute onset of painful blue discoloration of the toes.     No family history of AAA.   Patient denies amaurosis fugax or TIA symptoms. There is no history of claudication or rest pain symptoms of the lower extremities.  He does have pain in the right leg when he walks but it radiates from posterior to laterally down the leg and occurs with laying down in bed tooThe patient denies angina or shortness of breath.  Carotid duplex RICA 00-34% and LICA<30% Popliteal arteries Rt=1.28 and Lt=1.03  Current Meds  Medication Sig   abiraterone acetate (ZYTIGA) 250 MG tablet TAKE 1 TABLET (250 MG TOTAL) BY MOUTH DAILY.   amLODipine (NORVASC) 2.5 MG tablet TAKE 1 TABLET BY MOUTH ONCE DAILY (Patient taking differently: 10 mg daily. )   aspirin EC 81 MG tablet Take 81 mg by mouth daily.   calcium citrate-vitamin D (CITRACAL+D) 315-200 MG-UNIT tablet Take 1 tablet by mouth daily.   cholecalciferol (VITAMIN D) 1000 units tablet Take 1,000 Units by mouth daily.    clopidogrel (PLAVIX) 75 MG tablet Take 1 tablet (75 mg total) by mouth daily.   fexofenadine (ALLEGRA) 180 MG tablet Take 180 mg by mouth daily as needed for allergies or rhinitis.   fluticasone (FLONASE) 50 MCG/ACT nasal spray Place 2 sprays into both nostrils daily as needed for allergies.    isosorbide mononitrate (IMDUR) 30 MG 24 hr tablet TAKE 1 TABLET BY MOUTH ONCE DAILY   losartan (COZAAR) 50 MG tablet Take  1 tablet (50 mg total) by mouth daily.   lovastatin (MEVACOR) 40 MG tablet TAKE 2 TABLETS BY MOUTH AT BEDTIME   Multiple Vitamin (MULTI-VITAMINS) TABS Take 1 tablet by mouth daily.    nitroGLYCERIN (NITROSTAT) 0.4 MG SL tablet Place 0.4 mg under the tongue every 5 (five) minutes as needed for chest pain.   pantoprazole (PROTONIX) 40 MG tablet Take 40 mg by mouth 2 (two) times daily.     Past Medical History:  Diagnosis Date   Allergic rhinitis    Barrett's esophagus 10/21/13   Bone cancer (HCC)    Chronic kidney disease    stones   Colon polyp, hyperplastic    COPD (chronic obstructive pulmonary disease) (HCC)    mild COPD. former smoker   Coronary artery disease    Diverticulosis    Fundic gland polyps of stomach, benign    Gastritis    GERD (gastroesophageal reflux disease)    Hypercholesteremia    Hypertension    Prostate cancer ([place])    Prostatism    Reflux esophagitis    Renal stones    Skin cancer    left cheek/lesion excised   Skin cancer    Sleep apnea    TIA (transient ischemic attack)    TIA (transient ischemic attack)     Past Surgical History:  Procedure Laterality Date   CARDIAC CATHETERIZATION     COLONOSCOPY  WITH PROPOFOL N/A 09/13/2015   Procedure: COLONOSCOPY WITH PROPOFOL;  Surgeon: Lollie Sails, MD;  Location: [facility] ENDOSCOPY;  Service: Endoscopy;  Laterality: N/A;   CORONARY ANGIOPLASTY     ESOPHAGOGASTRODUODENOSCOPY (EGD) WITH PROPOFOL N/A 09/13/2015   Procedure: ESOPHAGOGASTRODUODENOSCOPY (EGD) WITH PROPOFOL;  Surgeon: Lollie Sails, MD;  Location: [facility] ENDOSCOPY;  Service: Endoscopy;  Laterality: N/A;   ESOPHAGOGASTRODUODENOSCOPY (EGD) WITH PROPOFOL N/A 12/28/2015   Procedure: ESOPHAGOGASTRODUODENOSCOPY (EGD) WITH PROPOFOL;  Surgeon: Lollie Sails, MD;  Location: [facility] ENDOSCOPY;  Service: Endoscopy;  Laterality: N/A;   ESOPHAGOGASTRODUODENOSCOPY (EGD) WITH PROPOFOL N/A 06/16/2016   Procedure:  ESOPHAGOGASTRODUODENOSCOPY (EGD) WITH PROPOFOL;  Surgeon: Lollie Sails, MD;  Location: [facility] ENDOSCOPY;  Service: Endoscopy;  Laterality: N/A;   EYE SURGERY  2013   CATARACT EXTRACTION   GANGLION CYST EXCISION Right 05/18/2017   Procedure: REMOVAL GANGLION CYST ANKLE;  Surgeon: Samara Deist, DPM;  Location: [facility] ORS;  Service: Podiatry;  Laterality: Right;   heart cath stent     LEFT HEART CATH AND CORONARY ANGIOGRAPHY Right 12/03/2017   Procedure: Left Heart Cath and Coronary Angiography with possible coronary intervention;  Surgeon: Dionisio David, MD;  Location: [place] CV LAB;  Service: Cardiovascular;  Laterality: Right;   PROSTATE BIOPSY     stents     multiple    Social History Social History   Tobacco Use   Smoking status: Former Smoker    Packs/day: 1.00    Years: 35.00    Pack years: 35.00    Types: Cigarettes    Quit date: 03/06/1986    Years since quitting: 32.8   Smokeless tobacco: Former Systems developer    Types: Chew  Substance Use Topics   Alcohol use: No   Drug use: No    Family History Family History  Problem Relation Age of Onset   Cancer Mother        gastric and lung   Cancer Father        multiple myeloma   Stroke Father    Cancer Sister        leukemia   Cancer Brother        leukemia   Cancer Brother        kidney   Cancer Daughter        Uterine   Cancer Other        [place] (Sister's Son): Prostate    Allergies  Allergen Reactions   Atorvastatin Other (See Comments)    Achy joints     REVIEW OF SYSTEMS (Negative unless checked)  Constitutional: '[]' Weight loss  '[]' Fever  '[]' Chills Cardiac: '[]' Chest pain   '[]' Chest pressure   '[]' Palpitations   '[]' Shortness of breath when laying flat   '[]' Shortness of breath with exertion. Vascular:  '[]' Pain in legs with walking   '[]' Pain in legs at rest  '[]' History of DVT   '[]' Phlebitis   '[]' Swelling in legs   '[]' Varicose veins   '[]' Non-healing ulcers Pulmonary:   '[]' Uses home oxygen    '[]' Productive cough   '[]' Hemoptysis   '[]' Wheeze  '[]' COPD   '[]' Asthma Neurologic:  '[]' Dizziness   '[]' Seizures   '[]' History of stroke   '[]' History of TIA  '[]' Aphasia   '[]' Vissual changes   '[]' Weakness or numbness in arm   '[]' Weakness or numbness in leg Musculoskeletal:   '[]' Joint swelling   '[]' Joint pain   '[]' Low back pain Hematologic:  '[]' Easy bruising  '[]' Easy bleeding   '[]' Hypercoagulable state   '[]' Anemic Gastrointestinal:  '[]' Diarrhea   '[]' Vomiting  '[]' Gastroesophageal  reflux/heartburn   '[]' Difficulty swallowing. Genitourinary:  '[]' Chronic kidney disease   '[]' Difficult urination  '[]' Frequent urination   '[]' Blood in urine Skin:  '[]' Rashes   '[]' Ulcers  Psychological:  '[]' History of anxiety   '[]'  History of major depression.  Physical Examination  Vitals:   01/20/19 1008  BP: 123/64  Pulse: 80  Resp: 16  Weight: 207 lb 12.8 oz (94.3 kg)   Body mass index is 29.82 kg/m. Gen: WD/WN, NAD Head: [place]/AT, No temporalis wasting.  Ear/Nose/Throat: Hearing grossly intact, nares w/o erythema or drainage Eyes: PER, EOMI, sclera nonicteric.  Neck: Supple, no large masses.   Pulmonary:  Good air movement, no audible wheezing bilaterally, no use of accessory muscles.  Cardiac: RRR, no JVD Vascular:  Vessel Right Left  Radial Palpable Palpable  Popliteal Enlarged Palpable Enlarged Palpable  PT Palpable Palpable  DP Palpable Palpable  Gastrointestinal: Non-distended. No guarding/no peritoneal signs.  Musculoskeletal: M/S 5/5 throughout.  No deformity or atrophy.  Neurologic: CN 2-12 intact. Symmetrical.  Speech is fluent. Motor exam as listed above. Psychiatric: Judgment intact, Mood & affect appropriate for pt's clinical situation. Dermatologic: No rashes or ulcers noted.  No changes consistent with cellulitis. Lymph : No lichenification or skin changes of chronic lymphedema.  CBC Lab Results  Component Value Date   WBC 5.2 12/25/2018   HGB 10.4 (L) 12/25/2018   HCT 32.2 (L) 12/25/2018   MCV 82.1 12/25/2018   PLT 231  12/25/2018    BMET    Component Value Date/Time   NA 139 12/25/2018 1026   NA 140 09/16/2013 0438   K 3.4 (L) 12/25/2018 1026   K 4.2 09/16/2013 0438   CL 108 12/25/2018 1026   CL 109 (H) 09/16/2013 0438   CO2 23 12/25/2018 1026   CO2 24 09/16/2013 0438   GLUCOSE 108 (H) 12/25/2018 1026   GLUCOSE 101 (H) 09/16/2013 0438   BUN 19 12/25/2018 1026   BUN 20 (H) 09/16/2013 0438   CREATININE 1.07 12/25/2018 1026   CREATININE 1.28 09/16/2013 0438   CALCIUM 9.0 12/25/2018 1026   CALCIUM 8.3 (L) 09/16/2013 0438   GFRNONAA >60 12/25/2018 1026   GFRNONAA 54 (L) 09/16/2013 0438   GFRAA >60 12/25/2018 1026   GFRAA >60 09/16/2013 0438   CrCl cannot be calculated (Patient's most recent lab result is older than the maximum 21 days allowed.).  COAG No results found for: INR, PROTIME  Radiology Vas US Carotid  Result Date: 01/20/2019 Carotid Arterial Duplex Study Indications:       TIA. Comparison Study:  11/02/2009 Performing Technologist: Charlane Ferretti RT (R)(VS)  Examination Guidelines: A complete evaluation includes B-mode imaging, spectral Doppler, color Doppler, and power Doppler as needed of all accessible portions of each vessel. Bilateral testing is considered an integral part of a complete examination. Limited examinations for reoccurring indications may be performed as noted.  Right Carotid Findings: +----------+--------+--------+--------+------------------+--------------------+             PSV cm/s EDV cm/s Stenosis Plaque Description Comments              +----------+--------+--------+--------+------------------+--------------------+  CCA Prox   68       18                                   tortuous              +----------+--------+--------+--------+------------------+--------------------+  CCA Mid    111  30                                   tortuous              +----------+--------+--------+--------+------------------+--------------------+  CCA Distal 73       21                 calcific           intimal thickening    +----------+--------+--------+--------+------------------+--------------------+  ICA Prox   90       34                calcific                                 +----------+--------+--------+--------+------------------+--------------------+  ICA Mid    84       35                                   ICA/CCA ratio = 1.80  +----------+--------+--------+--------+------------------+--------------------+  ICA Distal 129      41                                   tortuous              +----------+--------+--------+--------+------------------+--------------------+  ECA        150      22                                                         +----------+--------+--------+--------+------------------+--------------------+ +----------+--------+-------+--------+-------------------+             PSV cm/s EDV cms Describe Arm Pressure (mmHG)  +----------+--------+-------+--------+-------------------+  Subclavian 96                                             +----------+--------+-------+--------+-------------------+ +---------+--------+--+--------+--+  Vertebral PSV cm/s 95 EDV cm/s 21  +---------+--------+--+--------+--+ Left Carotid Findings: +----------+--------+--------+--------+------------------+--------------------+             PSV cm/s EDV cm/s Stenosis Plaque Description Comments              +----------+--------+--------+--------+------------------+--------------------+  CCA Prox   122      24                                                         +----------+--------+--------+--------+------------------+--------------------+  CCA Mid    113      30                                                         +----------+--------+--------+--------+------------------+--------------------+  CCA Distal 108      25                calcific           intimal thickening    +----------+--------+--------+--------+------------------+--------------------+  ICA Prox   105      27                                    ICA/CCA ratio = 1.01  +----------+--------+--------+--------+------------------+--------------------+  ICA Mid    114      39                                                         +----------+--------+--------+--------+------------------+--------------------+  ICA Distal 100      31                                   tortuous              +----------+--------+--------+--------+------------------+--------------------+  ECA        152      22                                                         +----------+--------+--------+--------+------------------+--------------------+ +----------+--------+--------+--------+-------------------+             PSV cm/s EDV cm/s Describe Arm Pressure (mmHG)  +----------+--------+--------+--------+-------------------+  Subclavian 204                                             +----------+--------+--------+--------+-------------------+ +---------+--------+---+--------+--+  Vertebral PSV cm/s 100 EDV cm/s 31  +---------+--------+---+--------+--+  Summary: Right Carotid: Velocities in the right ICA are consistent with a 40-59%                stenosis. The ECA appears <50% stenosed. Left Carotid: Velocities in the left ICA are consistent with a 1-39% stenosis.               The ECA appears >50% stenosed. Vertebrals:  Bilateral vertebral arteries demonstrate antegrade flow. Subclavians: Left subclavian artery flow was disturbed. Normal flow hemodynamics              were seen in the right subclavian artery. *See table(s) above for measurements and observations.  Electronically signed by Hortencia Pilar MD on 01/20/2019 at 4:54:54 PM.    Final    Vas [place] Lower Extremity Arterial Duplex  Result Date: 01/20/2019 LOWER EXTREMITY ARTERIAL DUPLEX STUDY  Current ABI: N/A Performing Technologist: Charlane Ferretti RT (R)(VS)  Examination Guidelines: A complete evaluation includes B-mode imaging, spectral Doppler, color Doppler, and power Doppler as needed of all accessible  portions of each vessel. Bilateral testing is considered an integral part of a complete examination. Limited examinations for reoccurring indications may be performed as noted.  +----------+--------+-----+--------+--------+--------+  RIGHT      PSV cm/s Ratio Stenosis Waveform Comments  +----------+--------+-----+--------+--------+--------+  POP  Prox   43                                         +----------+--------+-----+--------+--------+--------+  POP Distal 75                                         +----------+--------+-----+--------+--------+--------+  +---------------+-------+-----------+--------+--------+-----+--------+  Right Popliteal AP (cm) Transv (cm) Waveform Stenosis Shape Comments  +---------------+-------+-----------+--------+--------+-----+--------+  Proximal        1.28    1.07        biphasic                          +---------------+-------+-----------+--------+--------+-----+--------+ +--------------+-------+-----------+---------+--------+-----+--------+  Left Popliteal AP (cm) Transv (cm) Waveform  Stenosis Shape Comments  +--------------+-------+-----------+---------+--------+-----+--------+  Proximal       1.03    0.72        triphasic                          +--------------+-------+-----------+---------+--------+-----+--------+  +----------+--------+-----+--------+--------+--------+  LEFT       PSV cm/s Ratio Stenosis Waveform Comments  +----------+--------+-----+--------+--------+--------+  POP Prox   48                                         +----------+--------+-----+--------+--------+--------+  POP Distal 59                                         +----------+--------+-----+--------+--------+--------+  Summary: Right: Right popliteal artery largest diameter is 1.28cm x 1.07cm. Left: Left popliteal artery largest measurement is 1.03cm x .72cm.  See table(s) above for measurements and observations. Electronically signed by Hortencia Pilar MD on 01/20/2019 at 4:54:46 PM.    Final    Vas  US Aorta/ivc/iliacs  Result Date: 01/20/2019 ABDOMINAL AORTA STUDY Indications: Follow up exam for known AAA.  Performing Technologist: Charlane Ferretti RT (R)(VS)  Examination Guidelines: A complete evaluation includes B-mode imaging, spectral Doppler, color Doppler, and power Doppler as needed of all accessible portions of each vessel. Bilateral testing is considered an integral part of a complete examination. Limited examinations for reoccurring indications may be performed as noted.  Abdominal Aorta Findings: +-----------+-------+----------+----------+--------+--------+--------+  Location    AP (cm) Trans (cm) PSV (cm/s) Waveform Thrombus Comments  +-----------+-------+----------+----------+--------+--------+--------+  Proximal    2.44    2.38       58                                     +-----------+-------+----------+----------+--------+--------+--------+  Mid         2.18    2.14       82                                     +-----------+-------+----------+----------+--------+--------+--------+  Distal      2.75    3.26       67                                     +-----------+-------+----------+----------+--------+--------+--------+  RT CIA Prox 1.5     1.3        152                                    +-----------+-------+----------+----------+--------+--------+--------+  LT CIA Prox 1.5     1.4        125                                    +-----------+-------+----------+----------+--------+--------+--------+  Summary: Abdominal Aorta: There is evidence of abnormal dilatation of the distal Abdominal aorta. The largest aortic measurement is 3.3 cm. No previous exam available for comparison.  *See table(s) above for measurements and observations.  Electronically signed by Hortencia Pilar MD on 01/20/2019 at 4:54:51 PM.   Final      Assessment/Plan 1. Abdominal aortic aneurysm (AAA) without rupture ([place]) No surgery or intervention at this time. The patient has an asymptomatic abdominal aortic aneurysm  that is greater than 4 cm but less than 5 cm in maximal diameter.  I have discussed the natural history of abdominal aortic aneurysm and the small risk of rupture for aneurysm less than 5 cm in size.  However, as these small aneurysms tend to enlarge over time, continued surveillance with ultrasound or CT scan is mandatory.  I have also discussed optimizing medical management with hypertension and lipid control and the importance of abstinence from tobacco.  The patient is also encouraged to exercise a minimum of 30 minutes 4 times a week.  Should the patient develop new onset abdominal or back pain or signs of peripheral embolization they are instructed to seek medical attention immediately and to alert the physician providing care that they have an aneurysm.  The patient voices their understanding. I have scheduled the patient to return in 6 months with an aortic duplex.  2. Carotid artery disease, unspecified laterality, unspecified type ([place]) Recommend:  Given the patient's asymptomatic subcritical stenosis no further invasive testing or surgery at this time.  Duplex ultrasound shows <70% stenosis bilaterally.  Continue antiplatelet therapy as prescribed Continue management of CAD, HTN and Hyperlipidemia Healthy heart diet,  encouraged exercise at least 4 times per week Follow up in 6 months with duplex ultrasound and physical exam   3. Popliteal artery aneurysm (HCC) Follow up with duplex ultrasound annually   4. Coronary artery disease involving native coronary artery of native heart without angina pectoris Continue cardiac and antihypertensive medications as already ordered and reviewed, no changes at this time.  Continue statin as ordered and reviewed, no changes at this time  Nitrates PRN for chest pain   5. Atrial fibrillation, new onset ([place]) Continue antiarrhythmia medications as already ordered, these medications have been reviewed and there are no changes at this  time.  Continue anticoagulation as ordered by Cardiology Service   6. Hypertension, essential Continue antihypertensive medications as already ordered, these medications have been reviewed and there are no changes at this time.   7. Hypercholesterolemia Continue statin as ordered and reviewed, no changes at this time     Hortencia Pilar, MD  01/20/2019 8:13 PM

## 2019-01-21 ENCOUNTER — Other Ambulatory Visit: Payer: Self-pay | Admitting: Neurology

## 2019-01-21 DIAGNOSIS — G4733 Obstructive sleep apnea (adult) (pediatric): Secondary | ICD-10-CM | POA: Diagnosis not present

## 2019-01-21 DIAGNOSIS — R42 Dizziness and giddiness: Secondary | ICD-10-CM

## 2019-02-03 ENCOUNTER — Other Ambulatory Visit: Payer: Self-pay

## 2019-02-03 ENCOUNTER — Ambulatory Visit
Admission: RE | Admit: 2019-02-03 | Discharge: 2019-02-03 | Disposition: A | Discharge status: Home / Self Care | Payer: Medicare HMO | Source: Ambulatory Visit | Attending: Neurology | Admitting: Neurology

## 2019-02-03 DIAGNOSIS — E785 Hyperlipidemia, unspecified: Secondary | ICD-10-CM | POA: Diagnosis not present

## 2019-02-03 DIAGNOSIS — I10 Essential (primary) hypertension: Secondary | ICD-10-CM | POA: Diagnosis not present

## 2019-02-03 DIAGNOSIS — K219 Gastro-esophageal reflux disease without esophagitis: Secondary | ICD-10-CM | POA: Diagnosis not present

## 2019-02-03 DIAGNOSIS — I6523 Occlusion and stenosis of bilateral carotid arteries: Secondary | ICD-10-CM | POA: Diagnosis not present

## 2019-02-03 DIAGNOSIS — I2510 Atherosclerotic heart disease of native coronary artery without angina pectoris: Secondary | ICD-10-CM | POA: Diagnosis not present

## 2019-02-03 DIAGNOSIS — R42 Dizziness and giddiness: Secondary | ICD-10-CM

## 2019-02-03 DIAGNOSIS — I739 Peripheral vascular disease, unspecified: Secondary | ICD-10-CM | POA: Diagnosis not present

## 2019-02-03 DIAGNOSIS — G4730 Sleep apnea, unspecified: Secondary | ICD-10-CM | POA: Diagnosis not present

## 2019-02-03 DIAGNOSIS — R0602 Shortness of breath: Secondary | ICD-10-CM | POA: Diagnosis not present

## 2019-02-03 MED ORDER — IOHEXOL 350 MG/ML SOLN
75.0000 mL | Freq: Once | INTRAVENOUS | Status: AC | PRN
  Administered 2019-02-03: 75 mL via INTRAVENOUS

## 2019-02-04 DIAGNOSIS — M1611 Unilateral primary osteoarthritis, right hip: Secondary | ICD-10-CM | POA: Diagnosis not present

## 2019-02-05 DIAGNOSIS — M1611 Unilateral primary osteoarthritis, right hip: Secondary | ICD-10-CM | POA: Diagnosis not present

## 2019-02-05 DIAGNOSIS — M25551 Pain in right hip: Secondary | ICD-10-CM | POA: Diagnosis not present

## 2019-02-19 MED FILL — ABIRATERONE ACETATE 250 MG: 250 | 30 days supply | Qty: 30 | Fill #4

## 2019-02-24 DIAGNOSIS — I10 Essential (primary) hypertension: Secondary | ICD-10-CM | POA: Diagnosis not present

## 2019-02-24 DIAGNOSIS — R0602 Shortness of breath: Secondary | ICD-10-CM | POA: Diagnosis not present

## 2019-02-24 DIAGNOSIS — I2510 Atherosclerotic heart disease of native coronary artery without angina pectoris: Secondary | ICD-10-CM | POA: Diagnosis not present

## 2019-02-24 DIAGNOSIS — K219 Gastro-esophageal reflux disease without esophagitis: Secondary | ICD-10-CM | POA: Diagnosis not present

## 2019-02-24 DIAGNOSIS — E785 Hyperlipidemia, unspecified: Secondary | ICD-10-CM | POA: Diagnosis not present

## 2019-02-24 DIAGNOSIS — I739 Peripheral vascular disease, unspecified: Secondary | ICD-10-CM | POA: Diagnosis not present

## 2019-02-24 DIAGNOSIS — G4730 Sleep apnea, unspecified: Secondary | ICD-10-CM | POA: Diagnosis not present

## 2019-03-01 DIAGNOSIS — G4733 Obstructive sleep apnea (adult) (pediatric): Secondary | ICD-10-CM | POA: Diagnosis not present

## 2019-03-04 ENCOUNTER — Ambulatory Visit: Payer: Medicare HMO | Attending: Internal Medicine

## 2019-03-04 DIAGNOSIS — Z20828 Contact with and (suspected) exposure to other viral communicable diseases: Secondary | ICD-10-CM | POA: Diagnosis not present

## 2019-03-04 DIAGNOSIS — Z20822 Contact with and (suspected) exposure to covid-19: Secondary | ICD-10-CM

## 2019-03-05 LAB — NOVEL CORONAVIRUS, NAA: SARS-CoV-2, NAA: NOT DETECTED

## 2019-03-13 ENCOUNTER — Other Ambulatory Visit: Payer: Self-pay | Admitting: Internal Medicine

## 2019-03-17 IMAGING — MR MR HEAD W/O CM
10 series · 48 of 48 positions shown · non-contrast
Comparison: Brain MRI 12/25/2009. Head and cervical spine CT
12/05/2011.

CLINICAL DATA: 81-year-old male with dizziness and syncope.

EXAM:
MRI HEAD WITHOUT CONTRAST
TECHNIQUE: Multiplanar, multiecho pulse sequences of the brain and surrounding
structures were obtained without intravenous contrast.

[Series 2: T1 · sagittal · 5.0mm · 0.45mm/px · 3 of 27 slices shown (1 of 2)]
[im 1/27]
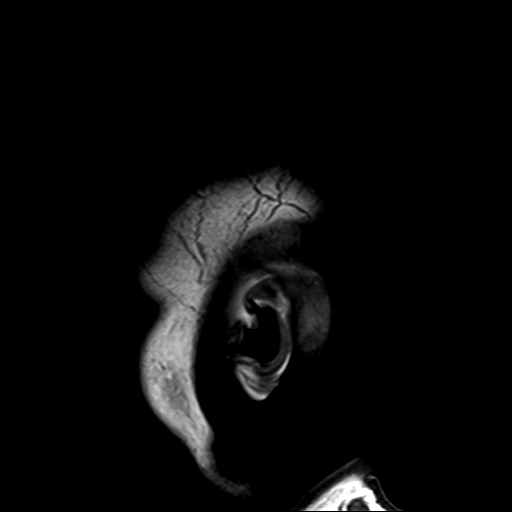
[im 14/27]
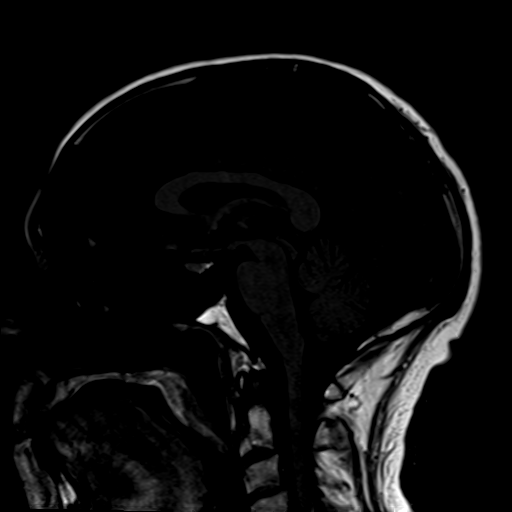
[im 27/27]
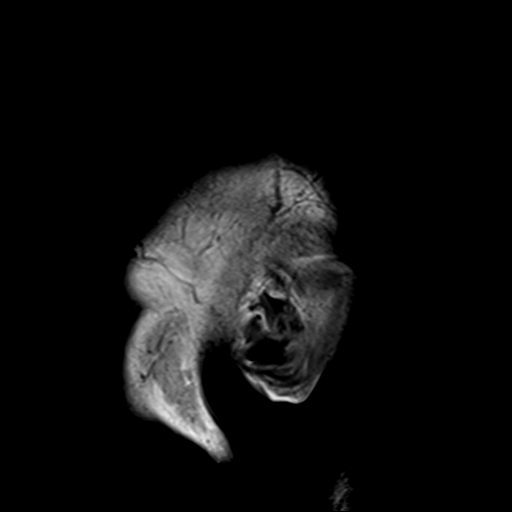

[Series 4: DWI · axial · 3.0mm · 1.80mm/px · z∈[-45,+116]mm · 5 of 55 slices shown (1 of 2)]
[im 1/55]
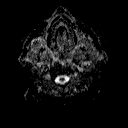
[im 14/55]
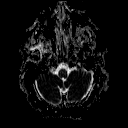
[im 28/55]
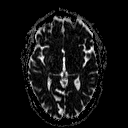
[im 41/55]
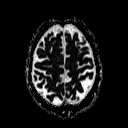
[im 55/55]
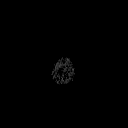

[Series 6: DWI · coronal · 3.0mm · 1.80mm/px · 4 of 51 slices shown (2 of 2)]
[im 1/51]
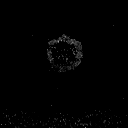
[im 17/51]
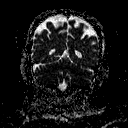
[im 34/51]
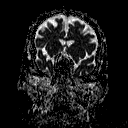
[im 51/51]
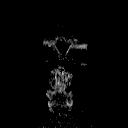

[Series 7: T2 · axial · 5.0mm · 0.60mm/px · z∈[-36,+118]mm · 2 of 25 slices shown (1 of 3)]
[im 1/25]
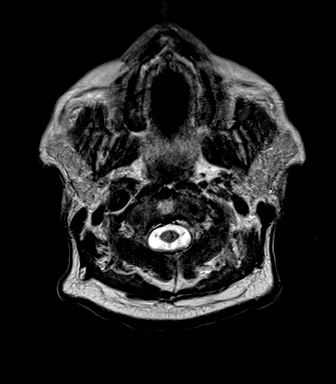
[im 25/25]
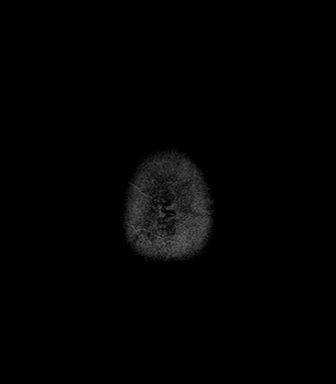

[Series 8: FLAIR · axial · 3.0mm · 0.45mm/px · z∈[-36,+118]mm · 5 of 53 slices shown]
[im 1/53]
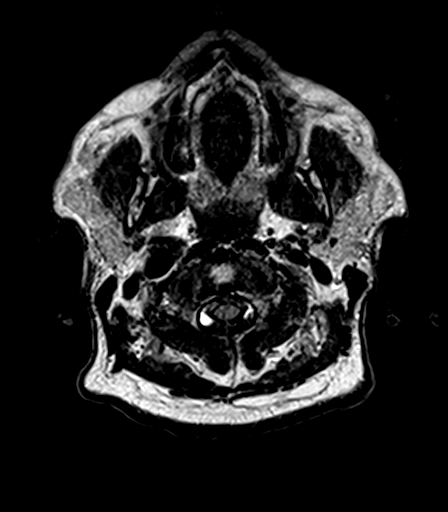
[im 14/53]
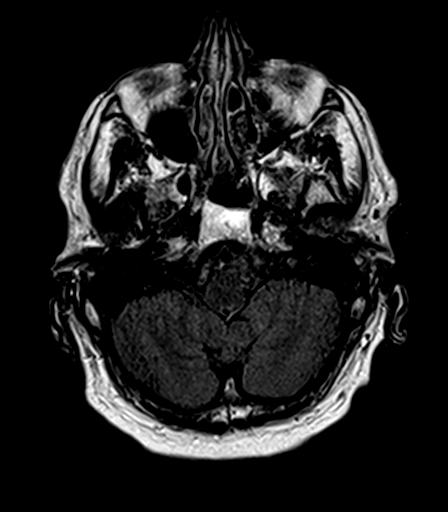
[im 27/53]
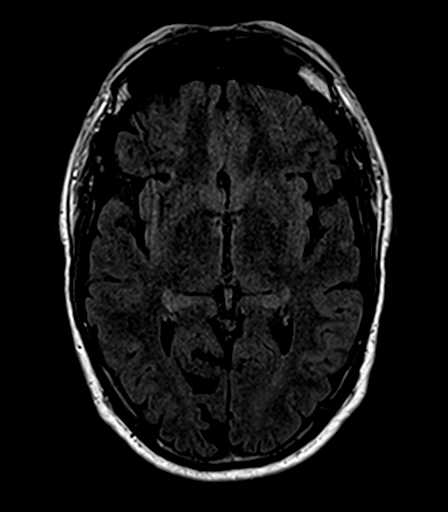
[im 40/53]
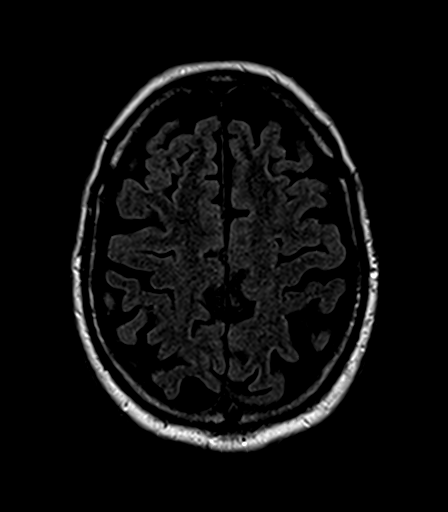
[im 53/53]
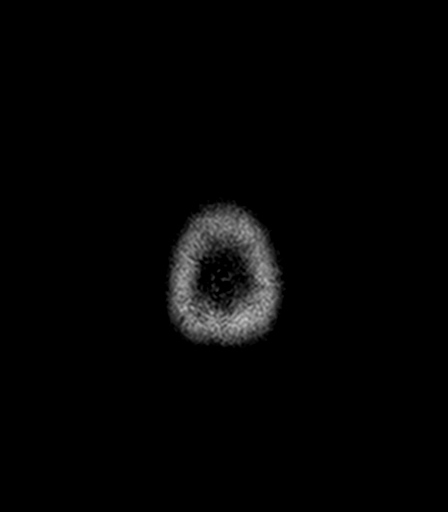

[Series 9: T2 · axial · 5.0mm · 0.45mm/px · z∈[-36,+118]mm · 2 of 25 slices shown (2 of 3)]
[im 1/25]
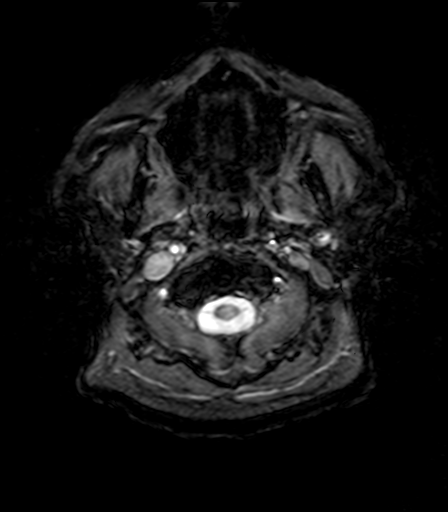
[im 25/25]
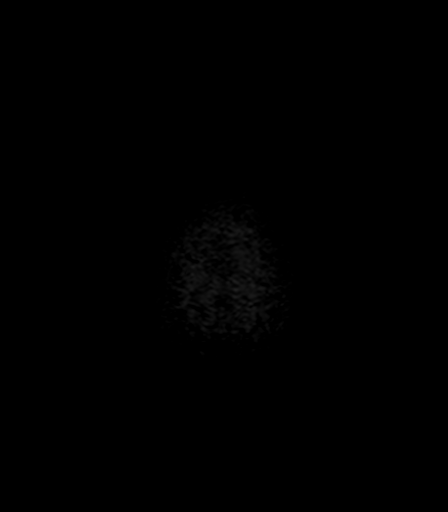

[Series 10: T1 · axial · 1.0mm · 1.00mm/px · z∈[-44,+128]mm · 15 of 175 slices shown (2 of 2)]
[im 1/175]
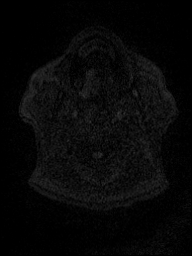
[im 13/175]
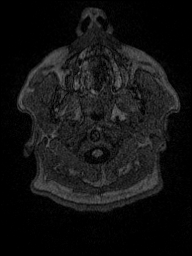
[im 25/175]
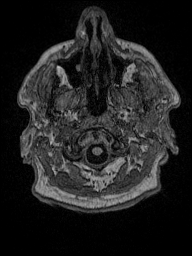
[im 38/175]
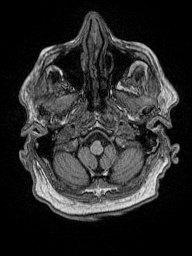
[im 50/175]
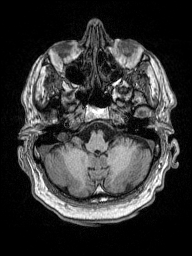
[im 63/175]
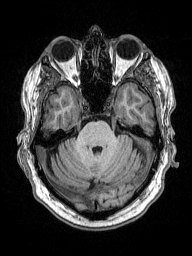
[im 75/175]
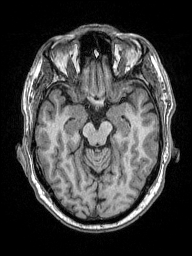
[im 88/175]
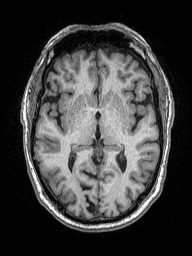
[im 100/175]
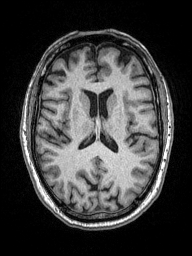
[im 112/175]
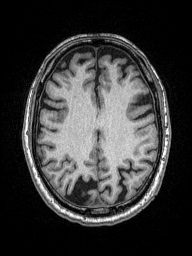
[im 125/175]
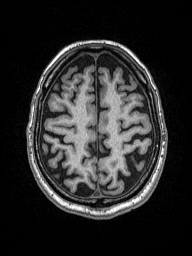
[im 137/175]
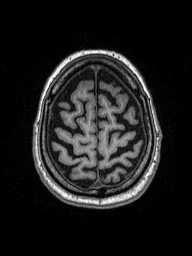
[im 150/175]
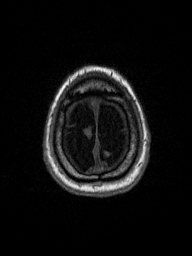
[im 162/175]
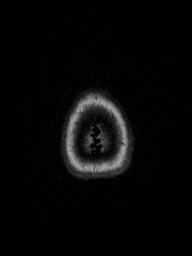
[im 175/175]
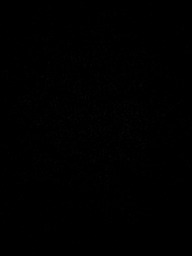

[Series 11: T2 · coronal · 5.0mm · 0.49mm/px · 3 of 31 slices shown (3 of 3)]
[im 1/31]
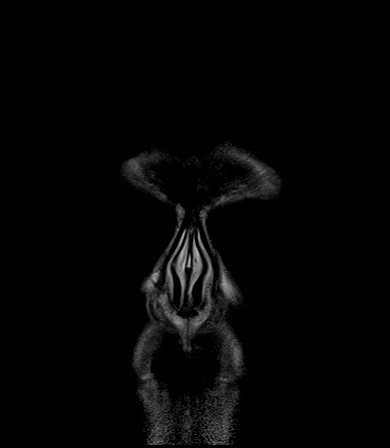
[im 16/31]
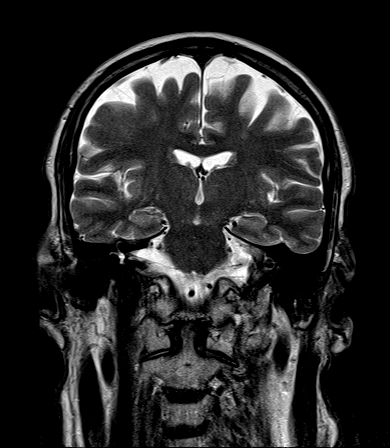
[im 31/31]
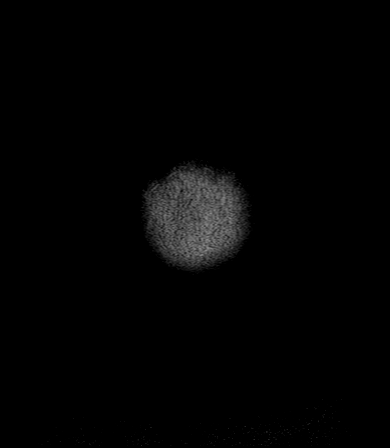

[Series 100: ax (id) · axial · 3.0mm · 1.80mm/px · z∈[-45,+116]mm · 5 of 55 slices shown]
[im 1/55]
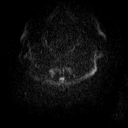
[im 14/55]
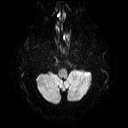
[im 28/55]
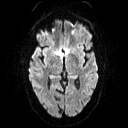
[im 41/55]
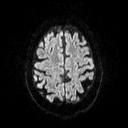
[im 55/55]
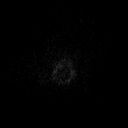

[Series 101: cor (id) · coronal · 3.0mm · 1.80mm/px · 4 of 47 slices shown]
[im 1/47]
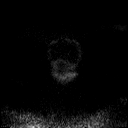
[im 16/47]
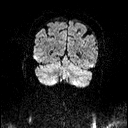
[im 31/47]
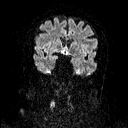
[im 47/47]
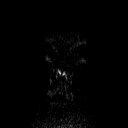

[48 of 48 positions shown; findings below may reference images not displayed]

FINDINGS: Brain: No restricted diffusion to suggest acute infarction. No
midline shift, mass effect, evidence of mass lesion,
ventriculomegaly, extra-axial collection or acute intracranial
hemorrhage. Cervicomedullary junction and pituitary are within
normal limits.

Cerebral volume is normal for age and not significantly changed
since 2377. There is a small area of cortical encephalomalacia in
the right inferior frontal gyrus on series 8, image 30 which is new
since 2722. Gray and white matter signal is otherwise normal for age
throughout the brain. No chronic cerebral blood products. No other
cortical encephalomalacia.

Vascular: Major intracranial vascular flow voids are stable since
2377. The right ICA and right vertebral artery appear mildly
dominant.

Skull and upper cervical spine: Negative visible cervical spine.
Normal bone marrow signal.

Sinuses/Orbits: Postoperative changes to both globes since 2377.
Otherwise normal orbits soft tissues. Paranasal sinuses and mastoids
are stable and well pneumatized.

Other: Visible internal auditory structures appear normal. Scalp and
face soft tissues appear negative.
IMPRESSION: 1.  No acute intracranial abnormality.
2. A small area of right inferior frontal gyrus encephalomalacia has
developed since 2722 and is nonspecific. Top differential
considerations include the sequelae of prior trauma or small prior
infarct.
3. Otherwise normal for age noncontrast MRI appearance of the brain.

## 2019-03-19 MED FILL — ABIRATERONE ACETATE 250 MG: 250 | 30 days supply | Qty: 30 | Fill #0

## 2019-03-20 DIAGNOSIS — L570 Actinic keratosis: Secondary | ICD-10-CM | POA: Diagnosis not present

## 2019-03-20 DIAGNOSIS — Z872 Personal history of diseases of the skin and subcutaneous tissue: Secondary | ICD-10-CM | POA: Diagnosis not present

## 2019-03-20 DIAGNOSIS — L578 Other skin changes due to chronic exposure to nonionizing radiation: Secondary | ICD-10-CM | POA: Diagnosis not present

## 2019-03-20 DIAGNOSIS — Z85828 Personal history of other malignant neoplasm of skin: Secondary | ICD-10-CM | POA: Diagnosis not present

## 2019-03-21 ENCOUNTER — Telehealth: Payer: Self-pay | Admitting: *Deleted

## 2019-03-21 ENCOUNTER — Other Ambulatory Visit: Payer: Self-pay | Admitting: Gastroenterology

## 2019-03-21 DIAGNOSIS — R1013 Epigastric pain: Secondary | ICD-10-CM

## 2019-03-21 DIAGNOSIS — Z7901 Long term (current) use of anticoagulants: Secondary | ICD-10-CM | POA: Diagnosis not present

## 2019-03-21 DIAGNOSIS — K22710 Barrett's esophagus with low grade dysplasia: Secondary | ICD-10-CM | POA: Diagnosis not present

## 2019-03-21 DIAGNOSIS — K5909 Other constipation: Secondary | ICD-10-CM | POA: Diagnosis not present

## 2019-03-21 NOTE — Telephone Encounter (Signed)
Call returned to wife and informed ok to proceed with Eligard injection as scheduled He plans to be at his appointment

## 2019-03-21 NOTE — Telephone Encounter (Signed)
Patient getting first COVID vaccine on Tuesday and is asking if he can still get his Eligard injection Wednesday. Please advise

## 2019-03-21 NOTE — Telephone Encounter (Signed)
Yes, patient may keep all scheduled apts s/p covid vaccine as already scheduled. (unless he feels that he wants to r/s them for another day).

## 2019-03-25 ENCOUNTER — Other Ambulatory Visit: Payer: Self-pay

## 2019-03-25 ENCOUNTER — Ambulatory Visit: Payer: Medicare HMO

## 2019-03-25 NOTE — Progress Notes (Signed)
Patient pre screened for office appointment, no questions or concerns today. Patient reminded of upcoming appointment time and date. 

## 2019-03-26 ENCOUNTER — Inpatient Hospital Stay: Payer: Medicare HMO | Attending: Internal Medicine

## 2019-03-26 ENCOUNTER — Inpatient Hospital Stay: Payer: Medicare HMO | Admitting: Internal Medicine

## 2019-03-26 ENCOUNTER — Other Ambulatory Visit: Payer: Self-pay

## 2019-03-26 ENCOUNTER — Inpatient Hospital Stay: Payer: Medicare HMO

## 2019-03-26 ENCOUNTER — Other Ambulatory Visit: Payer: Self-pay | Admitting: *Deleted

## 2019-03-26 VITALS — BP 135/74 | HR 84 | Temp 96.1°F | Wt 213.0 lb

## 2019-03-26 DIAGNOSIS — C61 Malignant neoplasm of prostate: Secondary | ICD-10-CM

## 2019-03-26 DIAGNOSIS — Z5111 Encounter for antineoplastic chemotherapy: Secondary | ICD-10-CM | POA: Insufficient documentation

## 2019-03-26 DIAGNOSIS — C775 Secondary and unspecified malignant neoplasm of intrapelvic lymph nodes: Secondary | ICD-10-CM | POA: Diagnosis not present

## 2019-03-26 DIAGNOSIS — I4891 Unspecified atrial fibrillation: Secondary | ICD-10-CM | POA: Diagnosis not present

## 2019-03-26 DIAGNOSIS — D649 Anemia, unspecified: Secondary | ICD-10-CM

## 2019-03-26 DIAGNOSIS — I724 Aneurysm of artery of lower extremity: Secondary | ICD-10-CM

## 2019-03-26 DIAGNOSIS — C7951 Secondary malignant neoplasm of bone: Secondary | ICD-10-CM | POA: Diagnosis not present

## 2019-03-26 LAB — COMPREHENSIVE METABOLIC PANEL
ALT: 16 U/L (ref 0–44)
AST: 27 U/L (ref 15–41)
Albumin: 3.7 g/dL (ref 3.5–5.0)
Alkaline Phosphatase: 50 U/L (ref 38–126)
Anion gap: 9 (ref 5–15)
BUN: 21 mg/dL (ref 8–23)
CO2: 28 mmol/L (ref 22–32)
Calcium: 9.3 mg/dL (ref 8.9–10.3)
Chloride: 102 mmol/L (ref 98–111)
Creatinine, Ser: 1.14 mg/dL (ref 0.61–1.24)
GFR calc Af Amer: >60 mL/min (ref >60)
GFR calc non Af Amer: 60 mL/min — ABNORMAL LOW (ref >60)
Glucose, Bld: 98 mg/dL (ref 70–99)
Potassium: 2.9 mmol/L — ABNORMAL LOW (ref 3.5–5.1)
Sodium: 139 mmol/L (ref 135–145)
Total Bilirubin: 0.7 mg/dL (ref 0.3–1.2)
Total Protein: 6.7 g/dL (ref 6.5–8.1)

## 2019-03-26 LAB — CBC WITH DIFFERENTIAL/PLATELET
Abs Immature Granulocytes: 0.01 10*3/uL (ref 0.00–0.07)
Basophils Absolute: 0 10*3/uL (ref 0.0–0.1)
Basophils Relative: 1 %
Eosinophils Absolute: 0.2 10*3/uL (ref 0.0–0.5)
Eosinophils Relative: 4 %
HCT: 29.9 % — ABNORMAL LOW (ref 39.0–52.0)
Hemoglobin: 9.7 g/dL — ABNORMAL LOW (ref 13.0–17.0)
Immature Granulocytes: 0 %
Lymphocytes Relative: 24 %
Lymphs Abs: 1.2 10*3/uL (ref 0.7–4.0)
MCH: 27.2 pg (ref 26.0–34.0)
MCHC: 32.4 g/dL (ref 30.0–36.0)
MCV: 83.8 fL (ref 80.0–100.0)
Monocytes Absolute: 0.7 10*3/uL (ref 0.1–1.0)
Monocytes Relative: 14 %
Neutro Abs: 2.9 10*3/uL (ref 1.7–7.7)
Neutrophils Relative %: 57 %
Platelets: 211 10*3/uL (ref 150–400)
RBC: 3.57 MIL/uL — ABNORMAL LOW (ref 4.22–5.81)
RDW: 14.8 % (ref 11.5–15.5)
WBC: 5 10*3/uL (ref 4.0–10.5)
nRBC: 0 % (ref 0.0–0.2)

## 2019-03-26 LAB — PSA: Prostatic Specific Antigen: <0.01 ng/mL (ref 0.00–4.00)

## 2019-03-26 LAB — IRON AND TIBC
Iron: 46 ug/dL (ref 45–182)
Saturation Ratios: 10 % — ABNORMAL LOW (ref 17.9–39.5)
TIBC: 445 ug/dL (ref 250–450)
UIBC: 399 ug/dL

## 2019-03-26 LAB — FERRITIN: Ferritin: 7 ng/mL — ABNORMAL LOW (ref 24–336)

## 2019-03-26 LAB — LACTATE DEHYDROGENASE: LDH: 160 U/L (ref 98–192)

## 2019-03-26 MED ORDER — POTASSIUM CHLORIDE CRYS ER 20 MEQ PO TBCR: 20.0000 meq | EXTENDED_RELEASE_TABLET | Freq: Every day | ORAL | 0 refills | Status: DC

## 2019-03-26 MED ORDER — LEUPROLIDE ACETATE (3 MONTH) 22.5 MG ~~LOC~~ KIT
22.5000 mg | PACK | Freq: Once | SUBCUTANEOUS | Status: AC
  Administered 2019-03-26: 22.5 mg via SUBCUTANEOUS
  Filled 2019-03-26: qty 22.5

## 2019-03-26 NOTE — Progress Notes (Signed)
[place] OFFICE PROGRESS NOTE  Patient Care Team: Einar Pheasant, MD as PCP - General (Internal Medicine)  Cancer Staging No matching staging information was found for the patient.   Oncology History Overview Note  Nov 2017- completed IM RT radiation therapy to his prostate and pelvic nodes for Gleason 7 (4+3) adenocarcinoma the prostate presenting the PSA of 7.8.  # JAN 2019- METASTATIC PROSTATE CA to Bone [low volume met on bone scan; CT- NED]; PSA ~50; March 25th 2019- Lupron 45 mg IM [Urology q 65M]; June 26, 2018-Eligard every 3 months[intolerance to Lupron/question A. Fib/injection site pain  # May 23rd Zytiga '250mg'$ /day; no prednisone  # Barretts- EGD [await Jan 2021- Dr.Toledo]; colo- [id number]   # [name], [place]]; Oct 2019- A.fib/not on eliquis; Dr.Khan/[facility].   ------------------------------------------------------------------  DIAGNOSIS: [ JAN 2019]- Met- PROSTATE CANCER  STAGE:  IV       ;GOALS: PALLIATIVE  CURRENT/MOST RECENT THERAPY [ march 2019]- Lupron; May 23rd 2019- Zytiga '250mg'$ /day    Malignant neoplasm of prostate ([place])      INTERVAL HISTORY:  Nathaniel Thompson 84 y.o.  male pleasant patient above history of metastatic prostate cancer on ADT/Zytiga is here for follow-up.  Patient appetite is good.  No weight loss.  No nausea no vomiting.  No swelling in the leg.  Denies any unusual cramping in the legs.  Chronic arthritis.  No hospitalizations.   Review of Systems  Constitutional: Negative for chills, diaphoresis, fever, malaise/fatigue and weight loss.  HENT: Negative for nosebleeds and sore throat.   Eyes: Negative for double vision.  Respiratory: Negative for cough, hemoptysis, sputum production, shortness of breath and wheezing.   Cardiovascular: Negative for chest pain, palpitations, orthopnea and leg swelling.  Gastrointestinal: Negative for abdominal pain, blood in stool, constipation, diarrhea, heartburn,  melena, nausea and vomiting.  Genitourinary: Negative for dysuria, frequency and urgency.  Musculoskeletal: Negative for back pain and joint pain.  Skin: Negative.  Negative for itching and rash.  Neurological: Positive for dizziness. Negative for tingling, focal weakness, weakness and headaches.  Endo/Heme/Allergies: Does not bruise/bleed easily.  Psychiatric/Behavioral: Negative for depression. The patient is not nervous/anxious and does not have insomnia.       PAST MEDICAL HISTORY :  Past Medical History:  Diagnosis Date  . Allergic rhinitis   . Barrett's esophagus 10/21/13  . Bone cancer ([place])   . Chronic kidney disease    stones  . Colon polyp, hyperplastic   . COPD (chronic obstructive pulmonary disease) (HCC)    mild COPD. former smoker  . Coronary artery disease   . Diverticulosis   . Fundic gland polyps of stomach, benign   . Gastritis   . GERD (gastroesophageal reflux disease)   . Hypercholesteremia   . Hypertension   . Prostate cancer ([place])   . Prostatism   . Reflux esophagitis   . Renal stones   . Skin cancer    left cheek/lesion excised  . Sleep apnea   . TIA (transient ischemic attack)   . TIA (transient ischemic attack)     PAST SURGICAL HISTORY :   Past Surgical History:  Procedure Laterality Date  . CARDIAC CATHETERIZATION    . COLONOSCOPY WITH PROPOFOL N/A 09/13/2015   Procedure: COLONOSCOPY WITH PROPOFOL;  Surgeon: Lollie Sails, MD;  Location: [facility] ENDOSCOPY;  Service: Endoscopy;  Laterality: N/A;  . CORONARY ANGIOPLASTY    . ESOPHAGOGASTRODUODENOSCOPY (EGD) WITH PROPOFOL N/A 09/13/2015   Procedure: ESOPHAGOGASTRODUODENOSCOPY (EGD) WITH PROPOFOL;  Surgeon:  Lollie Sails, MD;  Location: [facility] ENDOSCOPY;  Service: Endoscopy;  Laterality: N/A;  . ESOPHAGOGASTRODUODENOSCOPY (EGD) WITH PROPOFOL N/A 12/28/2015   Procedure: ESOPHAGOGASTRODUODENOSCOPY (EGD) WITH PROPOFOL;  Surgeon: Lollie Sails, MD;  Location: [facility] ENDOSCOPY;  Service: Endoscopy;   Laterality: N/A;  . ESOPHAGOGASTRODUODENOSCOPY (EGD) WITH PROPOFOL N/A 06/16/2016   Procedure: ESOPHAGOGASTRODUODENOSCOPY (EGD) WITH PROPOFOL;  Surgeon: Lollie Sails, MD;  Location: [facility] ENDOSCOPY;  Service: Endoscopy;  Laterality: N/A;  . EYE SURGERY  2013   CATARACT EXTRACTION  . GANGLION CYST EXCISION Right 05/18/2017   Procedure: REMOVAL GANGLION CYST ANKLE;  Surgeon: Samara Deist, DPM;  Location: [facility] ORS;  Service: Podiatry;  Laterality: Right;  . heart cath stent    . LEFT HEART CATH AND CORONARY ANGIOGRAPHY Right 12/03/2017   Procedure: Left Heart Cath and Coronary Angiography with possible coronary intervention;  Surgeon: Dionisio David, MD;  Location: [place] CV LAB;  Service: Cardiovascular;  Laterality: Right;  . PROSTATE BIOPSY    . stents     multiple    FAMILY HISTORY :   Family History  Problem Relation Age of Onset  . Cancer Mother        gastric and lung  . Cancer Father        multiple myeloma  . Stroke Father   . Cancer Sister        leukemia  . Cancer Brother        leukemia  . Cancer Brother        kidney  . Cancer Daughter        Uterine  . Cancer Other        Nephes (Sister's Son): Prostate    SOCIAL HISTORY:   Social History   Tobacco Use  . Smoking status: Former Smoker    Packs/day: 1.00    Years: 35.00    Pack years: 35.00    Types: Cigarettes    Quit date: 03/06/1986    Years since quitting: 33.0  . Smokeless tobacco: Former Systems developer    Types: Chew  Substance Use Topics  . Alcohol use: No  . Drug use: No    ALLERGIES:  is allergic to atorvastatin.  MEDICATIONS:  Current Outpatient Medications  Medication Sig Dispense Refill  . abiraterone acetate (ZYTIGA) 250 MG tablet TAKE 1 TABLET (250 MG TOTAL) BY MOUTH DAILY. 30 tablet 4  . amiodarone (PACERONE) 400 MG tablet Take 1 tablet (400 mg total) by mouth daily. 30 tablet 0  . amLODipine (NORVASC) 2.5 MG tablet TAKE 1 TABLET BY MOUTH ONCE DAILY (Patient taking differently: 10  mg daily. ) 90 tablet 1  . aspirin EC 81 MG tablet Take 81 mg by mouth daily.    . calcium citrate-vitamin D (CITRACAL+D) 315-200 MG-UNIT tablet Take 1 tablet by mouth daily.    . cholecalciferol (VITAMIN D) 1000 units tablet Take 1,000 Units by mouth daily.     . clopidogrel (PLAVIX) 75 MG tablet Take 1 tablet (75 mg total) by mouth daily. 90 tablet 0  . fexofenadine (ALLEGRA) 180 MG tablet Take 180 mg by mouth daily as needed for allergies or rhinitis.    . fluticasone (FLONASE) 50 MCG/ACT nasal spray Place 2 sprays into both nostrils daily as needed for allergies.     . hydrochlorothiazide (HYDRODIURIL) 12.5 MG tablet TAKE 1 TABLET BY MOUTH ONCE EVERY MORNING 90 tablet 1  . Ipratropium-Albuterol (COMBIVENT) 20-100 MCG/ACT AERS respimat 2 puffs qid prn 1 Inhaler 3  . isosorbide mononitrate (IMDUR)  30 MG 24 hr tablet TAKE 1 TABLET BY MOUTH ONCE DAILY 90 tablet 2  . losartan (COZAAR) 50 MG tablet Take 1 tablet (50 mg total) by mouth daily. 90 tablet 1  . lovastatin (MEVACOR) 40 MG tablet TAKE 2 TABLETS BY MOUTH AT BEDTIME 180 tablet 1  . Multiple Vitamin (MULTI-VITAMINS) TABS Take 1 tablet by mouth daily.     . nitroGLYCERIN (NITROSTAT) 0.4 MG SL tablet Place 0.4 mg under the tongue every 5 (five) minutes as needed for chest pain.    . pantoprazole (PROTONIX) 40 MG tablet Take 40 mg by mouth 2 (two) times daily.     . potassium chloride SA (KLOR-CON) 20 MEQ tablet Take 1 tablet (20 mEq total) by mouth daily. 30 tablet 0   No current facility-administered medications for this visit.    PHYSICAL EXAMINATION: ECOG PERFORMANCE STATUS: 0 - Asymptomatic  BP 135/74 (BP Location: Left Arm, Patient Position: Sitting, Cuff Size: Normal)   Pulse 84   Temp (!) 96.1 F (35.6 C) (Tympanic)   Wt 213 lb (96.6 kg)   BMI 30.56 kg/m   Filed Weights   03/26/19 1024  Weight: 213 lb (96.6 kg)    Physical Exam  Constitutional: He is oriented to person, place, and time and well-developed,  well-nourished, and in no distress.  He is alone.  Is walking himself.  HENT:  Head: Normocephalic and atraumatic.  Mouth/Throat: Oropharynx is clear and moist. No oropharyngeal exudate.  Eyes: Pupils are equal, round, and reactive to light.  Cardiovascular: Normal rate and regular rhythm.  Pulmonary/Chest: No respiratory distress. He has no wheezes.  Abdominal: Soft. Bowel sounds are normal. He exhibits no distension and no mass. There is no abdominal tenderness. There is no rebound and no guarding.  Musculoskeletal:        General: No tenderness or edema. Normal range of motion.     Cervical back: Normal range of motion and neck supple.  Neurological: He is alert and oriented to person, place, and time.  Skin: Skin is warm.  Psychiatric: Affect normal.      LABORATORY DATA:  I have reviewed the data as listed    Component Value Date/Time   NA 139 03/26/2019 0956   NA 140 09/16/2013 0438   K 2.9 (L) 03/26/2019 0956   K 4.2 09/16/2013 0438   CL 102 03/26/2019 0956   CL 109 (H) 09/16/2013 0438   CO2 28 03/26/2019 0956   CO2 24 09/16/2013 0438   GLUCOSE 98 03/26/2019 0956   GLUCOSE 101 (H) 09/16/2013 0438   BUN 21 03/26/2019 0956   BUN 20 (H) 09/16/2013 0438   CREATININE 1.14 03/26/2019 0956   CREATININE 1.28 09/16/2013 0438   CALCIUM 9.3 03/26/2019 0956   CALCIUM 8.3 (L) 09/16/2013 0438   PROT 6.7 03/26/2019 0956   PROT 6.7 09/15/2013 0755   ALBUMIN 3.7 03/26/2019 0956   ALBUMIN 3.5 09/15/2013 0755   AST 27 03/26/2019 0956   AST 37 09/15/2013 0755   ALT 16 03/26/2019 0956   ALT 28 09/15/2013 0755   ALKPHOS 50 03/26/2019 0956   ALKPHOS 44 (L) 09/15/2013 0755   BILITOT 0.7 03/26/2019 0956   BILITOT 0.5 09/15/2013 0755   GFRNONAA 60 (L) 03/26/2019 0956   GFRNONAA 54 (L) 09/16/2013 0438   GFRAA >60 03/26/2019 0956   GFRAA >60 09/16/2013 0438    No results found for: SPEP, UPEP  Lab Results  Component Value Date   WBC 5.0 03/26/2019   NEUTROABS  2.9 03/26/2019    HGB 9.7 (L) 03/26/2019   HCT 29.9 (L) 03/26/2019   MCV 83.8 03/26/2019   PLT 211 03/26/2019      Chemistry      Component Value Date/Time   NA 139 03/26/2019 0956   NA 140 09/16/2013 0438   K 2.9 (L) 03/26/2019 0956   K 4.2 09/16/2013 0438   CL 102 03/26/2019 0956   CL 109 (H) 09/16/2013 0438   CO2 28 03/26/2019 0956   CO2 24 09/16/2013 0438   BUN 21 03/26/2019 0956   BUN 20 (H) 09/16/2013 0438   CREATININE 1.14 03/26/2019 0956   CREATININE 1.28 09/16/2013 0438      Component Value Date/Time   CALCIUM 9.3 03/26/2019 0956   CALCIUM 8.3 (L) 09/16/2013 0438   ALKPHOS 50 03/26/2019 0956   ALKPHOS 44 (L) 09/15/2013 0755   AST 27 03/26/2019 0956   AST 37 09/15/2013 0755   ALT 16 03/26/2019 0956   ALT 28 09/15/2013 0755   BILITOT 0.7 03/26/2019 0956   BILITOT 0.5 09/15/2013 0755       RADIOGRAPHIC STUDIES: I have personally reviewed the radiological images as listed and agreed with the findings in the report. No results found.   ASSESSMENT & PLAN:  Malignant neoplasm of prostate ([place]) # Castrate sensitive-metastatic prostate cancer to the bone. Stage IV-on ADT/Zytiga low-dose. Bone scan October 2020-stable positive response. OCT 2020- PSA- <0.01. STABLE.   # Hypokalemia- sec to Zytiga; start Kudr 20 meq BID. Stop zytiga; will plan to restart at next visit.  # Anemia-hemoglobin 9.7.  Slow trend down.  Check stool occult blood; also iron studies ferritin today.  Check LDH.  #History of Barrett's esophagus-stable.  Awaiting repeat EGD tomorrow.  # A. fib paroxysmal-currently in sinus rhythm. on  Aspirin Plavix.  Stable.  #Right hip pain- STABLE.   I spoke at length with the patient's wife regarding the patient's clinical status/plan of care.  Family agreement.   # DISPOSITION: check iron/ feritin;LDH; stool cards;  #Eligard today # follow up in 1 months; labs-cbc/cmp/PSA- Dr.B  Cc; Dr.Scott    Orders Placed This Encounter  Procedures  . CBC with  Differential    Standing Status:   Future    Standing Expiration Date:   03/25/2020  . Comprehensive metabolic panel    Standing Status:   Future    Standing Expiration Date:   03/25/2020  . PSA    Standing Status:   Future    Standing Expiration Date:   03/25/2020  . Occult blood card to lab, stool    Standing Status:   Future    Standing Expiration Date:   03/25/2020  . Occult blood card to lab, stool    Standing Status:   Future    Standing Expiration Date:   03/25/2020  . Occult blood card to lab, stool    Standing Status:   Future    Standing Expiration Date:   03/25/2020   All questions were answered. The patient knows to call the clinic with any problems, questions or concerns.      Cammie Sickle, MD 03/26/2019 11:38 AM

## 2019-03-26 NOTE — Assessment & Plan Note (Addendum)
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 Bone scan October 2020-stable positive response. OCT 2020- PSA- <0.01. STABLE.   # Hypokalemia- sec to Zytiga; start Kudr 20 meq BID. Stop zytiga; will plan to restart at next visit.  # Anemia-hemoglobin 9.7.  Slow trend down.  Check stool occult blood; also iron studies ferritin today.  Check LDH.  #History of Barrett's esophagus-stable.  Awaiting repeat EGD tomorrow.  # A. fib paroxysmal-currently in sinus rhythm. on  Aspirin Plavix.  Stable.  #Right hip pain- STABLE.   I spoke at length with the patient's wife regarding the patient's clinical status/plan of care.  Family agreement.   # DISPOSITION: check iron/ feritin;LDH; stool cards;  # Eligard today # follow up in 1 months; labs-cbc/cmp/PSA- Dr.B  Cc; Dr.Scott  Addendum: Spoke to Algis Liming, NP [facility] re: iron deficiency; pt awaiting EGD in march. Recommend Colonoscopy also. Await FOBT.

## 2019-03-26 NOTE — Patient Instructions (Signed)
#   Take Potassium pills # STOP ZYTIGA

## 2019-03-27 ENCOUNTER — Ambulatory Visit
Admission: RE | Admit: 2019-03-27 | Discharge: 2019-03-27 | Disposition: A | Discharge status: Home / Self Care | Payer: Medicare HMO | Source: Ambulatory Visit | Attending: Gastroenterology | Admitting: Gastroenterology

## 2019-03-27 ENCOUNTER — Other Ambulatory Visit: Payer: Self-pay

## 2019-03-27 DIAGNOSIS — R1013 Epigastric pain: Secondary | ICD-10-CM | POA: Diagnosis not present

## 2019-03-30 ENCOUNTER — Encounter: Payer: Self-pay | Admitting: Internal Medicine

## 2019-03-31 ENCOUNTER — Other Ambulatory Visit: Payer: Self-pay

## 2019-03-31 ENCOUNTER — Other Ambulatory Visit (INDEPENDENT_AMBULATORY_CARE_PROVIDER_SITE_OTHER): Payer: Medicare HMO

## 2019-03-31 ENCOUNTER — Telehealth: Payer: Self-pay | Admitting: Internal Medicine

## 2019-03-31 ENCOUNTER — Other Ambulatory Visit: Payer: Self-pay | Admitting: Internal Medicine

## 2019-03-31 DIAGNOSIS — I10 Essential (primary) hypertension: Secondary | ICD-10-CM | POA: Diagnosis not present

## 2019-03-31 DIAGNOSIS — Z5111 Encounter for antineoplastic chemotherapy: Secondary | ICD-10-CM | POA: Diagnosis not present

## 2019-03-31 DIAGNOSIS — D500 Iron deficiency anemia secondary to blood loss (chronic): Secondary | ICD-10-CM | POA: Insufficient documentation

## 2019-03-31 DIAGNOSIS — R739 Hyperglycemia, unspecified: Secondary | ICD-10-CM | POA: Diagnosis not present

## 2019-03-31 DIAGNOSIS — E7800 Pure hypercholesterolemia, unspecified: Secondary | ICD-10-CM

## 2019-03-31 DIAGNOSIS — C61 Malignant neoplasm of prostate: Secondary | ICD-10-CM

## 2019-03-31 DIAGNOSIS — D649 Anemia, unspecified: Secondary | ICD-10-CM | POA: Diagnosis not present

## 2019-03-31 LAB — CBC WITH DIFFERENTIAL/PLATELET
Basophils Absolute: 0 10*3/uL (ref 0.0–0.1)
Basophils Relative: 0.6 % (ref 0.0–3.0)
Eosinophils Absolute: 0.2 10*3/uL (ref 0.0–0.7)
Eosinophils Relative: 4.6 % (ref 0.0–5.0)
HCT: 31.1 % — ABNORMAL LOW (ref 39.0–52.0)
Hemoglobin: 10.5 g/dL — ABNORMAL LOW (ref 13.0–17.0)
Lymphocytes Relative: 27.3 % (ref 12.0–46.0)
Lymphs Abs: 1.2 10*3/uL (ref 0.7–4.0)
MCHC: 33.9 g/dL (ref 30.0–36.0)
MCV: 80.4 fl (ref 78.0–100.0)
Monocytes Absolute: 0.5 10*3/uL (ref 0.1–1.0)
Monocytes Relative: 12.2 % — ABNORMAL HIGH (ref 3.0–12.0)
Neutro Abs: 2.4 10*3/uL (ref 1.4–7.7)
Neutrophils Relative %: 55.3 % (ref 43.0–77.0)
Platelets: 220 10*3/uL (ref 150.0–400.0)
RBC: 3.86 Mil/uL — ABNORMAL LOW (ref 4.22–5.81)
RDW: 15.9 % — ABNORMAL HIGH (ref 11.5–15.5)
WBC: 4.3 10*3/uL (ref 4.0–10.5)

## 2019-03-31 LAB — LIPID PANEL
Cholesterol: 110 mg/dL (ref 0–200)
HDL: 31.4 mg/dL — ABNORMAL LOW (ref >39.00)
LDL Cholesterol: 50 mg/dL (ref 0–99)
NonHDL: 78.53
Total CHOL/HDL Ratio: 4
Triglycerides: 142 mg/dL (ref 0.0–149.0)
VLDL: 28.4 mg/dL (ref 0.0–40.0)

## 2019-03-31 LAB — BASIC METABOLIC PANEL
BUN: 19 mg/dL (ref 6–23)
CO2: 30 mEq/L (ref 19–32)
Calcium: 9.7 mg/dL (ref 8.4–10.5)
Chloride: 101 mEq/L (ref 96–112)
Creatinine, Ser: 1.09 mg/dL (ref 0.40–1.50)
GFR: 64.87 mL/min (ref >60.00)
Glucose, Bld: 104 mg/dL — ABNORMAL HIGH (ref 70–99)
Potassium: 3.3 mEq/L — ABNORMAL LOW (ref 3.5–5.1)
Sodium: 140 mEq/L (ref 135–145)

## 2019-03-31 LAB — TSH: TSH: 1.82 u[IU]/mL (ref 0.35–4.50)

## 2019-03-31 LAB — HEPATIC FUNCTION PANEL
ALT: 13 U/L (ref 0–53)
AST: 20 U/L (ref 0–37)
Albumin: 3.9 g/dL (ref 3.5–5.2)
Alkaline Phosphatase: 52 U/L (ref 39–117)
Bilirubin, Direct: 0.1 mg/dL (ref 0.0–0.3)
Total Bilirubin: 0.4 mg/dL (ref 0.2–1.2)
Total Protein: 6.3 g/dL (ref 6.0–8.3)

## 2019-03-31 LAB — HEMOGLOBIN A1C: Hgb A1c MFr Bld: 6 % (ref 4.6–6.5)

## 2019-03-31 LAB — VITAMIN B12: Vitamin B-12: 267 pg/mL (ref 211–911)

## 2019-03-31 LAB — FERRITIN: Ferritin: 5.7 ng/mL — ABNORMAL LOW (ref 22.0–322.0)

## 2019-03-31 NOTE — Progress Notes (Signed)
Apt scheduled for venofer

## 2019-03-31 NOTE — Progress Notes (Signed)
C- Please schedule the patient for IV Venofer at next visit with me.

## 2019-03-31 NOTE — Telephone Encounter (Signed)
On 1/22-spoke to patient wife regarding low iron levels.  Recommend p.o. iron as discussed previously.  Previously constipated with p.o. iron.  Recommend stool softener.  If symptomatic would recommend IV iron.  Also discussed my recommendations regarding colonoscopy evaluation with upcoming EGD.

## 2019-04-01 ENCOUNTER — Other Ambulatory Visit: Payer: Self-pay

## 2019-04-01 DIAGNOSIS — Z5111 Encounter for antineoplastic chemotherapy: Secondary | ICD-10-CM | POA: Diagnosis not present

## 2019-04-01 DIAGNOSIS — D649 Anemia, unspecified: Secondary | ICD-10-CM

## 2019-04-01 DIAGNOSIS — C61 Malignant neoplasm of prostate: Secondary | ICD-10-CM

## 2019-04-01 LAB — OCCULT BLOOD X 1 CARD TO LAB, STOOL
Fecal Occult Bld: NEGATIVE
Fecal Occult Bld: NEGATIVE

## 2019-04-02 ENCOUNTER — Ambulatory Visit (INDEPENDENT_AMBULATORY_CARE_PROVIDER_SITE_OTHER): Payer: Medicare HMO | Admitting: Internal Medicine

## 2019-04-02 ENCOUNTER — Other Ambulatory Visit: Payer: Self-pay

## 2019-04-02 DIAGNOSIS — Z8673 Personal history of transient ischemic attack (TIA), and cerebral infarction without residual deficits: Secondary | ICD-10-CM

## 2019-04-02 DIAGNOSIS — G4730 Sleep apnea, unspecified: Secondary | ICD-10-CM

## 2019-04-02 DIAGNOSIS — C61 Malignant neoplasm of prostate: Secondary | ICD-10-CM

## 2019-04-02 DIAGNOSIS — I779 Disorder of arteries and arterioles, unspecified: Secondary | ICD-10-CM

## 2019-04-02 DIAGNOSIS — E7800 Pure hypercholesterolemia, unspecified: Secondary | ICD-10-CM

## 2019-04-02 DIAGNOSIS — I714 Abdominal aortic aneurysm, without rupture, unspecified: Secondary | ICD-10-CM

## 2019-04-02 DIAGNOSIS — J449 Chronic obstructive pulmonary disease, unspecified: Secondary | ICD-10-CM

## 2019-04-02 DIAGNOSIS — E538 Deficiency of other specified B group vitamins: Secondary | ICD-10-CM

## 2019-04-02 DIAGNOSIS — I4891 Unspecified atrial fibrillation: Secondary | ICD-10-CM | POA: Diagnosis not present

## 2019-04-02 DIAGNOSIS — D649 Anemia, unspecified: Secondary | ICD-10-CM | POA: Diagnosis not present

## 2019-04-02 DIAGNOSIS — I724 Aneurysm of artery of lower extremity: Secondary | ICD-10-CM

## 2019-04-02 DIAGNOSIS — I10 Essential (primary) hypertension: Secondary | ICD-10-CM

## 2019-04-02 DIAGNOSIS — I2510 Atherosclerotic heart disease of native coronary artery without angina pectoris: Secondary | ICD-10-CM

## 2019-04-02 DIAGNOSIS — R739 Hyperglycemia, unspecified: Secondary | ICD-10-CM

## 2019-04-02 DIAGNOSIS — K2270 Barrett's esophagus without dysplasia: Secondary | ICD-10-CM

## 2019-04-02 NOTE — Progress Notes (Signed)
Patient ID: Nathaniel Thompson, male   DOB: 03/10/1935, 84 y.o.   MRN: [id number]   Virtual Visit via video Note  This visit type was conducted due to national recommendations for restrictions regarding the COVID-19 pandemic (e.g. social distancing).  This format is felt to be most appropriate for this patient at this time.  All issues noted in this document were discussed and addressed.  No physical exam was performed (except for noted visual exam findings with Video Visits).   I connected with Nathaniel Thompson by a video enabled telemedicine application and verified that I am speaking with the correct person using two identifiers. Location patient: home Location provider: work  Persons participating in the virtual visit: patient, provider  The limitations, risks, security and privacy concerns of performing an evaluation and management service by video and the availability of in person appointments have been discussed. The patient expressed understanding and agreed to proceed.   Reason for visit: scheduled follow up.   HPI: Being followed by Dr Rogue Bussing for history of prostate cancer and anemia.  Last hgb 9.7.  Planning for EGD/colonoscopy - 05/07/19.  Has history of Barretts.  Some acid reflux.  No nausea or vomiting.  Taking iron daily.  Tolerating.  Taking miralax.  Helping bowels.  No chest pain or sob reported.  Has been seeing Dr Yancey Flemings.  Recently he stopped hctz.  Started on chlorthalidone.  Also started on potassium.  Eating.  No nausea or vomiting.  Saw [facility] - f/u abdominal aneurysm - stable and recommended f/u in 6 months.  Also f/u carotid artery disease - <70% - f/u 6 months and f/u popliteal artery aneurysm - stable - f/u annually.  Eating.  No nausea or vomiting.  No abdominal pain.  Hip doing well s/p injection.  Discussed labs.  Continue potassium.  Start B12 injections.     ROS: See pertinent positives and negatives per HPI.  Past Medical History:  Diagnosis Date  . Allergic rhinitis    . Barrett's esophagus 10/21/13  . Bone cancer ([place])   . Chronic kidney disease    stones  . Colon polyp, hyperplastic   . COPD (chronic obstructive pulmonary disease) (HCC)    mild COPD. former smoker  . Coronary artery disease   . Diverticulosis   . Fundic gland polyps of stomach, benign   . Gastritis   . GERD (gastroesophageal reflux disease)   . Hypercholesteremia   . Hypertension   . Prostate cancer ([place])   . Prostatism   . Reflux esophagitis   . Renal stones   . Skin cancer    left cheek/lesion excised  . Sleep apnea   . TIA (transient ischemic attack)   . TIA (transient ischemic attack)     Past Surgical History:  Procedure Laterality Date  . CARDIAC CATHETERIZATION    . COLONOSCOPY WITH PROPOFOL N/A 09/13/2015   Procedure: COLONOSCOPY WITH PROPOFOL;  Surgeon: Lollie Sails, MD;  Location[facility] ENDOSCOPY;  Service: Endoscopy;  Laterality: N/A;  . CORONARY ANGIOPLASTY    . ESOPHAGOGASTRODUODENOSCOPY (EGD) WITH PROPOFOL N/A 09/13/2015   Procedure: ESOPHAGOGASTRODUODENOSCOPY (EGD) WITH PROPOFOL;  Surgeon: Lollie Sails, MD;  Location: [facility] ENDOSCOPY;  Service: Endoscopy;  Laterality: N/A;  . ESOPHAGOGASTRODUODENOSCOPY (EGD) WITH PROPOFOL N/A 12/28/2015   Procedure: ESOPHAGOGASTRODUODENOSCOPY (EGD) WITH PROPOFOL;  Surgeon: Lollie Sails, MD;  Location: [facility] ENDOSCOPY;  Service: Endoscopy;  Laterality: N/A;  . ESOPHAGOGASTRODUODENOSCOPY (EGD) WITH PROPOFOL N/A 06/16/2016   Procedure: ESOPHAGOGASTRODUODENOSCOPY (EGD) WITH PROPOFOL;  Surgeon: Hassell Done  Kassie Mends, MD;  Location: [facility] ENDOSCOPY;  Service: Endoscopy;  Laterality: N/A;  . EYE SURGERY  2013   CATARACT EXTRACTION  . GANGLION CYST EXCISION Right 05/18/2017   Procedure: REMOVAL GANGLION CYST ANKLE;  Surgeon: Samara Deist, DPM;  Location: [facility] ORS;  Service: Podiatry;  Laterality: Right;  . heart cath stent    . LEFT HEART CATH AND CORONARY ANGIOGRAPHY Right 12/03/2017   Procedure: Left Heart Cath and  Coronary Angiography with possible coronary intervention;  Surgeon: Dionisio David, MD;  Location: [place] CV LAB;  Service: Cardiovascular;  Laterality: Right;  . PROSTATE BIOPSY    . stents     multiple    Family History  Problem Relation Age of Onset  . Cancer Mother        gastric and lung  . Cancer Father        multiple myeloma  . Stroke Father   . Cancer Sister        leukemia  . Cancer Brother        leukemia  . Cancer Brother        kidney  . Cancer Daughter        Uterine  . Cancer Other        Nephes (Sister's Son): Prostate    SOCIAL HX: reviewed.    Current Outpatient Medications:  .  abiraterone acetate (ZYTIGA) 250 MG tablet, TAKE 1 TABLET (250 MG TOTAL) BY MOUTH DAILY., Disp: 30 tablet, Rfl: 4 .  amiodarone (PACERONE) 400 MG tablet, Take 1 tablet (400 mg total) by mouth daily., Disp: 30 tablet, Rfl: 0 .  amLODipine (NORVASC) 2.5 MG tablet, TAKE 1 TABLET BY MOUTH ONCE DAILY (Patient taking differently: 10 mg daily. ), Disp: 90 tablet, Rfl: 1 .  aspirin EC 81 MG tablet, Take 81 mg by mouth daily., Disp: , Rfl:  .  calcium citrate-vitamin D (CITRACAL+D) 315-200 MG-UNIT tablet, Take 1 tablet by mouth daily., Disp: , Rfl:  .  chlorthalidone (HYGROTON) 25 MG tablet, Take 25 mg by mouth daily., Disp: , Rfl:  .  cholecalciferol (VITAMIN D) 1000 units tablet, Take 1,000 Units by mouth daily. , Disp: , Rfl:  .  clopidogrel (PLAVIX) 75 MG tablet, Take 1 tablet (75 mg total) by mouth daily., Disp: 90 tablet, Rfl: 0 .  fexofenadine (ALLEGRA) 180 MG tablet, Take 180 mg by mouth daily as needed for allergies or rhinitis., Disp: , Rfl:  .  fluticasone (FLONASE) 50 MCG/ACT nasal spray, Place 2 sprays into both nostrils daily as needed for allergies. , Disp: , Rfl:  .  Ipratropium-Albuterol (COMBIVENT) 20-100 MCG/ACT AERS respimat, 2 puffs qid prn, Disp: 1 Inhaler, Rfl: 3 .  isosorbide mononitrate (IMDUR) 30 MG 24 hr tablet, TAKE 1 TABLET BY MOUTH ONCE DAILY, Disp: 90  tablet, Rfl: 2 .  losartan (COZAAR) 50 MG tablet, Take 1 tablet (50 mg total) by mouth daily., Disp: 90 tablet, Rfl: 1 .  lovastatin (MEVACOR) 40 MG tablet, TAKE 2 TABLETS BY MOUTH AT BEDTIME, Disp: 180 tablet, Rfl: 1 .  Multiple Vitamin (MULTI-VITAMINS) TABS, Take 1 tablet by mouth daily. , Disp: , Rfl:  .  nitroGLYCERIN (NITROSTAT) 0.4 MG SL tablet, Place 0.4 mg under the tongue every 5 (five) minutes as needed for chest pain., Disp: , Rfl:  .  pantoprazole (PROTONIX) 40 MG tablet, Take 40 mg by mouth 2 (two) times daily. , Disp: , Rfl:  .  potassium chloride SA (KLOR-CON) 20 MEQ tablet, Take 1 tablet (  20 mEq total) by mouth daily., Disp: 30 tablet, Rfl: 0  EXAM:  VITALS per patient if applicable: 272/53  GENERAL: alert, oriented, appears well and in no acute distress  HEENT: atraumatic, conjunttiva clear, no obvious abnormalities on inspection of external nose and ears  NECK: normal movements of the head and neck  LUNGS: on inspection no signs of respiratory distress, breathing rate appears normal, no obvious gross SOB, gasping or wheezing  CV: no obvious cyanosis  PSYCH/NEURO: pleasant and cooperative, no obvious depression or anxiety, speech and thought processing grossly intact  ASSESSMENT AND PLAN:  Discussed the following assessment and plan:  Abdominal aortic aneurysm (AAA) ([place]) Just evaluated by [facility] - 01/2019.  Stable.  Recommended f/u in 6 months.    Anemia Being followed by hematology.  On iron.  Following cbc/ferritin.  Planning for EGD/colonoscopy.    Atrial fibrillation, new onset ([place]) On amiodarone.  Has been followed by cardiology.  Denies any increased heart rate or palpitations.  Stable.  Follow.   Barrett's esophagus Some acid reflux.  PPI.  Has EGD planned.  Follow.   CAD (coronary artery disease) Continue risk factor modification.  Followed by cardiology.  Currently asymptomatic.   Carotid artery disease ([place]) Recent f/u - <70%.  Stable.   Recommended f/u in 6 months.    COPD (chronic obstructive pulmonary disease) ([place]) Has been followed by pulmonary.  Breathing stable.    History of TIA (transient ischemic attack) Doing well on aspirin and plavix.  Follow.    Hypercholesterolemia On lovastatin.  Low cholesterol diet and exercise.  Follow lipid panel and liver function tests.    Hyperglycemia Low carb diet and exercise.  Follow met b and a1c.    Hypertension, essential Blood pressure under good control.  Continue same medication regimen.  Follow pressures.  Follow metabolic panel.    Malignant neoplasm of prostate [facility]) Being followed by oncology.  Undergoing treatment.   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  Recent evaluation 01/2019.  Stable.  Recommended annual f/u.    Sleep apnea Continue cpap.    B12 deficiency Start B12 injections - 1054mg q week x 4 weeks and then 10052m q month.     Orders Placed This Encounter  Procedures  . Hepatic function panel    Standing Status:   Future    Standing Expiration Date:   04/05/2020  . Hemoglobin A1c    Standing Status:   Future    Standing Expiration Date:   04/05/2020  . Lipid panel    Standing Status:   Future    Standing Expiration Date:   04/05/2020  . Basic metabolic panel    Standing Status:   Future    Standing Expiration Date:   04/05/2020  . Ambulatory referral to Cardiology    Referral Priority:   Routine    Referral Type:   Consultation    Referral Reason:   Specialty Services Required    Requested Specialty:   Cardiology    Number of Visits Requested:   1     I discussed the assessment and treatment plan with the patient. The patient was provided an opportunity to ask questions and all were answered. The patient agreed with the plan and demonstrated an understanding of the instructions.   The patient was advised to call back or seek an in-person evaluation if the symptoms worsen or if the condition fails to improve as  anticipated.   ChEinar PheasantMD

## 2019-04-06 ENCOUNTER — Encounter: Payer: Self-pay | Admitting: Internal Medicine

## 2019-04-06 DIAGNOSIS — E538 Deficiency of other specified B group vitamins: Secondary | ICD-10-CM | POA: Insufficient documentation

## 2019-04-06 NOTE — Assessment & Plan Note (Signed)
Low carb diet and exercise.  Follow met b and a1c.

## 2019-04-06 NOTE — Assessment & Plan Note (Signed)
Start B12 injections.  1000mcg q week x 4 weeks and then 1000mcg q month.   

## 2019-04-06 NOTE — Assessment & Plan Note (Signed)
Continue risk factor modification.  Followed by cardiology.  Currently asymptomatic.

## 2019-04-06 NOTE — Assessment & Plan Note (Signed)
Blood pressure under good control.  Continue same medication regimen.  Follow pressures.  Follow metabolic panel.   

## 2019-04-06 NOTE — Assessment & Plan Note (Signed)
Being followed by oncology.  Undergoing treatment.

## 2019-04-06 NOTE — Assessment & Plan Note (Signed)
On lovastatin.  Low cholesterol diet and exercise.  Follow lipid panel and liver function tests.   

## 2019-04-06 NOTE — Assessment & Plan Note (Signed)
Continue cpap.  

## 2019-04-06 NOTE — Assessment & Plan Note (Signed)
Recent f/u - <70%.  Stable.  Recommended f/u in 6 months.

## 2019-04-06 NOTE — Assessment & Plan Note (Signed)
Has been followed by pulmonary.  Breathing stable.  

## 2019-04-06 NOTE — Assessment & Plan Note (Signed)
Evaluated by [facility].  Recent evaluation 01/2019.  Stable.  Recommended annual f/u.

## 2019-04-06 NOTE — Assessment & Plan Note (Signed)
Some acid reflux.  PPI.  Has EGD planned.  Follow.

## 2019-04-06 NOTE — Assessment & Plan Note (Signed)
Just evaluated by [facility] - 01/2019.  Stable.  Recommended f/u in 6 months.

## 2019-04-06 NOTE — Assessment & Plan Note (Signed)
Doing well on aspirin and plavix.  Follow.   

## 2019-04-06 NOTE — Assessment & Plan Note (Signed)
Being followed by hematology.  On iron.  Following cbc/ferritin.  Planning for EGD/colonoscopy.

## 2019-04-06 NOTE — Assessment & Plan Note (Signed)
On amiodarone.  Has been followed by cardiology.  Denies any increased heart rate or palpitations.  Stable.  Follow.

## 2019-04-08 ENCOUNTER — Ambulatory Visit (INDEPENDENT_AMBULATORY_CARE_PROVIDER_SITE_OTHER): Payer: Medicare HMO | Admitting: *Deleted

## 2019-04-08 ENCOUNTER — Other Ambulatory Visit: Payer: Self-pay | Admitting: Internal Medicine

## 2019-04-08 ENCOUNTER — Other Ambulatory Visit: Payer: Self-pay

## 2019-04-08 DIAGNOSIS — E538 Deficiency of other specified B group vitamins: Secondary | ICD-10-CM | POA: Diagnosis not present

## 2019-04-08 DIAGNOSIS — E876 Hypokalemia: Secondary | ICD-10-CM | POA: Diagnosis not present

## 2019-04-08 LAB — BASIC METABOLIC PANEL
BUN: 22 mg/dL (ref 6–23)
CO2: 28 mEq/L (ref 19–32)
Calcium: 9.6 mg/dL (ref 8.4–10.5)
Chloride: 102 mEq/L (ref 96–112)
Creatinine, Ser: 1.29 mg/dL (ref 0.40–1.50)
GFR: 53.41 mL/min — ABNORMAL LOW (ref >60.00)
Glucose, Bld: 115 mg/dL — ABNORMAL HIGH (ref 70–99)
Potassium: 3.5 mEq/L (ref 3.5–5.1)
Sodium: 138 mEq/L (ref 135–145)

## 2019-04-08 MED ORDER — CYANOCOBALAMIN 1000 MCG/ML IJ SOLN
1000.0000 ug | Freq: Once | INTRAMUSCULAR | Status: AC
  Administered 2019-04-08: 12:00:00 1000 ug via INTRAMUSCULAR

## 2019-04-08 NOTE — Progress Notes (Signed)
Order placed for f/u met b 

## 2019-04-08 NOTE — Progress Notes (Addendum)
Patient presented for B 12 injection to left deltoid, patient voiced no concerns nor showed any signs of distress during injection per last OV note star B 12 injections.04/02/19  Reviewed.  Dr Nicki Reaper

## 2019-04-11 ENCOUNTER — Other Ambulatory Visit: Payer: Self-pay | Admitting: Internal Medicine

## 2019-04-11 DIAGNOSIS — R944 Abnormal results of kidney function studies: Secondary | ICD-10-CM

## 2019-04-11 NOTE — Progress Notes (Signed)
Order placed for f/u met b 

## 2019-04-14 ENCOUNTER — Other Ambulatory Visit: Payer: Self-pay

## 2019-04-15 ENCOUNTER — Encounter: Payer: Self-pay | Admitting: Cardiovascular Disease

## 2019-04-15 ENCOUNTER — Ambulatory Visit: Payer: Medicare HMO | Admitting: Cardiovascular Disease

## 2019-04-15 VITALS — BP 122/62 | HR 86 | Ht 70.0 in | Wt 212.2 lb

## 2019-04-15 DIAGNOSIS — R0602 Shortness of breath: Secondary | ICD-10-CM | POA: Diagnosis not present

## 2019-04-15 DIAGNOSIS — I10 Essential (primary) hypertension: Secondary | ICD-10-CM

## 2019-04-15 DIAGNOSIS — E785 Hyperlipidemia, unspecified: Secondary | ICD-10-CM

## 2019-04-15 DIAGNOSIS — I2510 Atherosclerotic heart disease of native coronary artery without angina pectoris: Secondary | ICD-10-CM

## 2019-04-15 NOTE — Progress Notes (Signed)
Cardiology Office Note   Date:  04/15/2019   ID:  Nathaniel Thompson, DOB 10/05/1935, MRN [id number]  PCP:  Einar Pheasant, MD  Cardiologist:   Kathlyn Sacramento, MD   Chief Complaint  Patient presents with  . New Patient (Initial Visit)    CAD; Meds verbally reviewed with patient.      History of Present Illness: Nathaniel Thompson is a 84 y.o. male who presents to establish cardiovascular care.  He is transferring from Dr. Humphrey Rolls. He has known history of coronary artery disease status post stenting of the left circumflex and RCA.  He had initial PCI done in 2003 at [facility].  He had repeat PCI x2 at [facility] in 2004.  Most recent cardiac catheterization in 2019 was personally reviewed by me and showed patent left circumflex stent with 30% distal edge restenosis, mild LAD disease, patent distal RCA stent with 30% proximal stenosis.  Ejection fraction was normal. He quit smoking in the 80s.  He reports having TIA in the past.  Other medical problems include hyperlipidemia and essential hypertension. He was hospitalized in September 2019 with questionable tachycardia and atrial fibrillation.  Initially was placed on amiodarone and anticoagulation but all his EKGs revealed sinus rhythm at that time and thus both amiodarone and Eliquis were discontinued.  No recurrent arrhythmia since then.  Cardiac CTA in 2019 showed mild nonobstructive disease.  Echocardiogram in March 2020 showed normal LV systolic function with grade 1 diastolic dysfunction, severely dilated left atrium, mild mitral and tricuspid regurgitation and mild pulmonary hypertension.  He does complain of exertional dyspnea but no chest pain.  No palpitations.   Past Medical History:  Diagnosis Date  . Allergic rhinitis   . Barrett's esophagus 10/21/13  . Bone cancer ([place])   . Chronic kidney disease    stones  . Colon polyp, hyperplastic   . COPD (chronic obstructive pulmonary disease) (HCC)    mild COPD. former smoker  . Coronary  artery disease   . Diverticulosis   . Fundic gland polyps of stomach, benign   . Gastritis   . GERD (gastroesophageal reflux disease)   . Hypercholesteremia   . Hypertension   . Prostate cancer ([place])   . Prostatism   . Reflux esophagitis   . Renal stones   . Skin cancer    left cheek/lesion excised  . Sleep apnea   . TIA (transient ischemic attack)   . TIA (transient ischemic attack)     Past Surgical History:  Procedure Laterality Date  . CARDIAC CATHETERIZATION    . COLONOSCOPY WITH PROPOFOL N/A 09/13/2015   Procedure: COLONOSCOPY WITH PROPOFOL;  Surgeon: Lollie Sails, MD;  Location: [facility] ENDOSCOPY;  Service: Endoscopy;  Laterality: N/A;  . CORONARY ANGIOPLASTY    . ESOPHAGOGASTRODUODENOSCOPY (EGD) WITH PROPOFOL N/A 09/13/2015   Procedure: ESOPHAGOGASTRODUODENOSCOPY (EGD) WITH PROPOFOL;  Surgeon: Lollie Sails, MD;  Location: [facility] ENDOSCOPY;  Service: Endoscopy;  Laterality: N/A;  . ESOPHAGOGASTRODUODENOSCOPY (EGD) WITH PROPOFOL N/A 12/28/2015   Procedure: ESOPHAGOGASTRODUODENOSCOPY (EGD) WITH PROPOFOL;  Surgeon: Lollie Sails, MD;  Location: [facility] ENDOSCOPY;  Service: Endoscopy;  Laterality: N/A;  . ESOPHAGOGASTRODUODENOSCOPY (EGD) WITH PROPOFOL N/A 06/16/2016   Procedure: ESOPHAGOGASTRODUODENOSCOPY (EGD) WITH PROPOFOL;  Surgeon: Lollie Sails, MD;  Location: [facility] ENDOSCOPY;  Service: Endoscopy;  Laterality: N/A;  . EYE SURGERY  2013   CATARACT EXTRACTION  . GANGLION CYST EXCISION Right 05/18/2017   Procedure: REMOVAL GANGLION CYST ANKLE;  Surgeon: Samara Deist, DPM;  Location: [facility]  ORS;  Service: Podiatry;  Laterality: Right;  . heart cath stent    . LEFT HEART CATH AND CORONARY ANGIOGRAPHY Right 12/03/2017   Procedure: Left Heart Cath and Coronary Angiography with possible coronary intervention;  Surgeon: Dionisio David, MD;  Location: [place] CV LAB;  Service: Cardiovascular;  Laterality: Right;  . PROSTATE BIOPSY    . stents     multiple      Current Outpatient Medications  Medication Sig Dispense Refill  . abiraterone acetate (ZYTIGA) 250 MG tablet TAKE 1 TABLET (250 MG TOTAL) BY MOUTH DAILY. (Patient taking differently: Take 250 mg by mouth daily. On HOLD) 30 tablet 4  . amLODipine (NORVASC) 2.5 MG tablet TAKE 1 TABLET BY MOUTH ONCE DAILY (Patient taking differently: 10 mg daily. ) 90 tablet 1  . aspirin EC 81 MG tablet Take 81 mg by mouth daily.    . calcium citrate-vitamin D (CITRACAL+D) 315-200 MG-UNIT tablet Take 1 tablet by mouth daily.    . chlorthalidone (HYGROTON) 25 MG tablet Take 25 mg by mouth daily.    . cholecalciferol (VITAMIN D) 1000 units tablet Take 1,000 Units by mouth daily.     . clopidogrel (PLAVIX) 75 MG tablet Take 1 tablet (75 mg total) by mouth daily. 90 tablet 0  . ezetimibe (ZETIA) 10 MG tablet Take 10 mg by mouth daily.    . fexofenadine (ALLEGRA) 180 MG tablet Take 180 mg by mouth daily as needed for allergies or rhinitis.    [name] isosorbide mononitrate (IMDUR) 30 MG 24 hr tablet TAKE 1 TABLET BY MOUTH ONCE DAILY 90 tablet 2  . losartan (COZAAR) 50 MG tablet Take 1 tablet (50 mg total) by mouth daily. 90 tablet 1  . lovastatin (MEVACOR) 40 MG tablet TAKE 2 TABLETS BY MOUTH AT BEDTIME 180 tablet 1  . Multiple Vitamin (MULTI-VITAMINS) TABS Take 1 tablet by mouth daily.     . pantoprazole (PROTONIX) 40 MG tablet Take 40 mg by mouth 2 (two) times daily.     . potassium chloride SA (KLOR-CON) 20 MEQ tablet Take 1 tablet (20 mEq total) by mouth daily. 30 tablet 0   No current facility-administered medications for this visit.    Allergies:   Atorvastatin    Social History:  The patient  reports that he quit smoking about 33 years ago. His smoking use included cigarettes. He has a 35.00 pack-year smoking history. He has quit using smokeless tobacco.  His smokeless tobacco use included chew. He reports that he does not drink alcohol or use drugs.   Family History:  The patient's family history includes Cancer  in his brother, brother, daughter, father, mother, sister, and another family member; Stroke in his father.    ROS:  Please see the history of present illness.   Otherwise, review of systems are positive for none.   All other systems are reviewed and negative.    PHYSICAL EXAM: VS:  BP 122/62 (BP Location: Right Arm, Patient Position: Sitting, Cuff Size: Normal)   Pulse 86   Ht 5\' 10"  (1.778 m)   Wt 212 lb 4 oz (96.3 kg)   SpO2 97%   BMI 30.45 kg/m  , BMI Body mass index is 30.45 kg/m. GEN: Well nourished, well developed, in no acute distress  HEENT: normal  Neck: no JVD, carotid bruits, or masses Cardiac: RRR; no murmurs, rubs, or gallops,no edema  Respiratory:  clear to auscultation bilaterally, normal work of breathing GI: soft, nontender, nondistended, + BS MS: no  deformity or atrophy  Skin: warm and dry, no rash Neuro:  Strength and sensation are intact Psych: euthymic mood, full affect   EKG:  EKG is ordered today. The ekg ordered today demonstrates normal sinus rhythm with possible old inferior infarct.  No significant ST or T wave changes.   Recent Labs: 03/31/2019: ALT 13; Hemoglobin 10.5; Platelets 220.0; TSH 1.82 04/08/2019: BUN 22; Creatinine, Ser 1.29; Potassium 3.5; Sodium 138    Lipid Panel    Component Value Date/Time   CHOL 110 03/31/2019 0814   CHOL 103 09/16/2013 0438   TRIG 142.0 03/31/2019 0814   TRIG 95 09/16/2013 0438   HDL 31.40 (L) 03/31/2019 0814   HDL 30 (L) 09/16/2013 0438   CHOLHDL 4 03/31/2019 0814   VLDL 28.4 03/31/2019 0814   VLDL 19 09/16/2013 0438   LDLCALC 50 03/31/2019 0814   LDLCALC 54 09/16/2013 0438      Wt Readings from Last 3 Encounters:  04/15/19 212 lb 4 oz (96.3 kg)  04/02/19 214 lb (97.1 kg)  03/26/19 213 lb (96.6 kg)     I spent 20 minutes reviewing his previous cardiac records and are summarized above.  PAD Screen 04/15/2019  Previous PAD dx? No  Previous surgical procedure? No  Pain with walking? No  Feet/toe  relief with dangling? No  Painful, non-healing ulcers? No  Extremities discolored? No      ASSESSMENT AND PLAN:  1.  Coronary artery disease involving native coronary arteries without angina: He is overall doing reasonably well with no anginal symptoms.  He continues to be on dual antiplatelet therapy.  +  2.  Exertional dyspnea without chest pain: I requested an echocardiogram for evaluation.  3.  Essential hypertension: Blood pressure is controlled on current medications.    4.  Hyperlipidemia: Currently on lovastatin.  Recommend a target LDL of less than 70.  His most recent lipid profile in January showed an LDL of 50.    Disposition:   FU with me in 3 months  Signed,  Kathlyn Sacramento, MD  04/15/2019 3:25 PM    [place]

## 2019-04-15 NOTE — Patient Instructions (Signed)
Medication Instructions:  Your physician recommends that you continue on your current medications as directed. Please refer to the Current Medication list given to you today.  *If you need a refill on your cardiac medications before your next appointment, please call your pharmacy*  Lab Work: None ordered If you have labs (blood work) drawn today and your tests are completely normal, you will receive your results only by: [name] MyChart Message (if you have MyChart) OR . A paper copy in the mail If you have any lab test that is abnormal or we need to change your treatment, we will call you to review the results.  Testing/Procedures: Your physician has requested that you have an echocardiogram. Echocardiography is a painless test that uses sound waves to create images of your heart. It provides your doctor with information about the size and shape of your heart and how well your heart's chambers and valves are working. This procedure takes approximately one hour. There are no restrictions for this procedure.    Follow-Up: At [facility], you and your health needs are our priority.  As part of our continuing mission to provide you with exceptional heart care, we have created designated Provider Care Teams.  These Care Teams include your primary Cardiologist (physician) and Advanced Practice Providers (APPs -  Physician Assistants and Nurse Practitioners) who all work together to provide you with the care you need, when you need it.  Your next appointment:   3 month(s)  The format for your next appointment:   In Person  Provider:    You may see Dr. Fletcher Anon or one of the following Advanced Practice Providers on your designated Care Team:    Murray Hodgkins, NP  Christell Faith, PA-C  Marrianne Mood, PA-C   Other Instructions  Echocardiogram An echocardiogram is a procedure that uses painless sound waves (ultrasound) to produce an image of the heart. Images from an echocardiogram can  provide important information about:  Signs of coronary artery disease (CAD).  Aneurysm detection. An aneurysm is a weak or damaged part of an artery wall that bulges out from the normal force of blood pumping through the body.  Heart size and shape. Changes in the size or shape of the heart can be associated with certain conditions, including heart failure, aneurysm, and CAD.  Heart muscle function.  Heart valve function.  Signs of a past heart attack.  Fluid buildup around the heart.  Thickening of the heart muscle.  A tumor or infectious growth around the heart valves. Tell a health care provider about:  Any allergies you have.  All medicines you are taking, including vitamins, herbs, eye drops, creams, and over-the-counter medicines.  Any blood disorders you have.  Any surgeries you have had.  Any medical conditions you have.  Whether you are pregnant or may be pregnant. What are the risks? Generally, this is a safe procedure. However, problems may occur, including:  Allergic reaction to dye (contrast) that may be used during the procedure. What happens before the procedure? No specific preparation is needed. You may eat and drink normally. What happens during the procedure?   An IV tube may be inserted into one of your veins.  You may receive contrast through this tube. A contrast is an injection that improves the quality of the pictures from your heart.  A gel will be applied to your chest.  A wand-like tool (transducer) will be moved over your chest. The gel will help to transmit the sound waves  from the transducer.  The sound waves will harmlessly bounce off of your heart to allow the heart images to be captured in real-time motion. The images will be recorded on a computer. The procedure may vary among health care providers and hospitals. What happens after the procedure?  You may return to your normal, everyday life, including diet, activities, and  medicines, unless your health care provider tells you not to do that. Summary  An echocardiogram is a procedure that uses painless sound waves (ultrasound) to produce an image of the heart.  Images from an echocardiogram can provide important information about the size and shape of your heart, heart muscle function, heart valve function, and fluid buildup around your heart.  You do not need to do anything to prepare before this procedure. You may eat and drink normally.  After the echocardiogram is completed, you may return to your normal, everyday life, unless your health care provider tells you not to do that. This information is not intended to replace advice given to you by your health care provider. Make sure you discuss any questions you have with your health care provider. Document Revised: 06/13/2018 Document Reviewed: 03/25/2016 Elsevier Patient Education  [place].

## 2019-04-16 ENCOUNTER — Ambulatory Visit (INDEPENDENT_AMBULATORY_CARE_PROVIDER_SITE_OTHER): Payer: Medicare HMO | Admitting: Lab

## 2019-04-16 ENCOUNTER — Other Ambulatory Visit: Payer: Self-pay

## 2019-04-16 DIAGNOSIS — E538 Deficiency of other specified B group vitamins: Secondary | ICD-10-CM | POA: Diagnosis not present

## 2019-04-16 MED ORDER — CYANOCOBALAMIN 1000 MCG/ML IJ SOLN
1000.0000 ug | Freq: Once | INTRAMUSCULAR | Status: AC
  Administered 2019-04-16: 09:00:00 1000 ug via INTRAMUSCULAR

## 2019-04-16 NOTE — Progress Notes (Addendum)
Pt in office today for Vit B-12 injection in R- Deltoid. Pt tolerated well.  Reviewed.  Dr Nicki Reaper

## 2019-04-17 ENCOUNTER — Telehealth: Payer: Self-pay | Admitting: Internal Medicine

## 2019-04-17 NOTE — Telephone Encounter (Signed)
Pt's wife called and has questions about pt's lab work. Please advise.

## 2019-04-22 ENCOUNTER — Other Ambulatory Visit: Payer: Self-pay

## 2019-04-22 ENCOUNTER — Telehealth: Payer: Self-pay | Admitting: Cardiovascular Disease

## 2019-04-22 ENCOUNTER — Other Ambulatory Visit: Payer: Self-pay | Admitting: Internal Medicine

## 2019-04-22 NOTE — Telephone Encounter (Signed)
    [facility] Pre-operative Risk Assessment    Request for surgical clearance:  1. What type of surgery is being performed? UPPER ENDOSCOPY  2. When is this surgery scheduled? 05/07/19  3. What type of clearance is required (medical clearance vs. Pharmacy clearance to hold med vs. Both)?  BOTH 4.  5. Are there any medications that need to be held prior to surgery and how long? PLAVIX X 5 DAYS  6. Practice name and name of physician performing surgery? [facility], PA-C [facility]  7. What is your office phone number [phone]   7.   What is your office fax number [phone]  8.   Anesthesia type (None, local, MAC, general) ? NOT LISTED   Lucienne Minks 04/22/2019, 12:41 PM  _________________________________________________________________   (provider comments below)

## 2019-04-23 ENCOUNTER — Other Ambulatory Visit: Payer: Self-pay

## 2019-04-23 ENCOUNTER — Inpatient Hospital Stay: Payer: Medicare HMO | Attending: Internal Medicine

## 2019-04-23 ENCOUNTER — Inpatient Hospital Stay: Payer: Medicare HMO | Admitting: Internal Medicine

## 2019-04-23 ENCOUNTER — Ambulatory Visit (INDEPENDENT_AMBULATORY_CARE_PROVIDER_SITE_OTHER): Payer: Medicare HMO

## 2019-04-23 ENCOUNTER — Other Ambulatory Visit: Payer: Self-pay | Admitting: Internal Medicine

## 2019-04-23 ENCOUNTER — Inpatient Hospital Stay: Payer: Medicare HMO

## 2019-04-23 ENCOUNTER — Other Ambulatory Visit: Payer: Medicare HMO

## 2019-04-23 DIAGNOSIS — I2510 Atherosclerotic heart disease of native coronary artery without angina pectoris: Secondary | ICD-10-CM | POA: Insufficient documentation

## 2019-04-23 DIAGNOSIS — C7951 Secondary malignant neoplasm of bone: Secondary | ICD-10-CM | POA: Diagnosis not present

## 2019-04-23 DIAGNOSIS — J449 Chronic obstructive pulmonary disease, unspecified: Secondary | ICD-10-CM | POA: Diagnosis not present

## 2019-04-23 DIAGNOSIS — R944 Abnormal results of kidney function studies: Secondary | ICD-10-CM | POA: Diagnosis not present

## 2019-04-23 DIAGNOSIS — N189 Chronic kidney disease, unspecified: Secondary | ICD-10-CM | POA: Diagnosis not present

## 2019-04-23 DIAGNOSIS — D509 Iron deficiency anemia, unspecified: Secondary | ICD-10-CM | POA: Insufficient documentation

## 2019-04-23 DIAGNOSIS — E538 Deficiency of other specified B group vitamins: Secondary | ICD-10-CM | POA: Diagnosis not present

## 2019-04-23 DIAGNOSIS — I129 Hypertensive chronic kidney disease with stage 1 through stage 4 chronic kidney disease, or unspecified chronic kidney disease: Secondary | ICD-10-CM | POA: Diagnosis not present

## 2019-04-23 DIAGNOSIS — E876 Hypokalemia: Secondary | ICD-10-CM | POA: Diagnosis not present

## 2019-04-23 DIAGNOSIS — I480 Paroxysmal atrial fibrillation: Secondary | ICD-10-CM | POA: Diagnosis not present

## 2019-04-23 DIAGNOSIS — C61 Malignant neoplasm of prostate: Secondary | ICD-10-CM | POA: Insufficient documentation

## 2019-04-23 DIAGNOSIS — Z79899 Other long term (current) drug therapy: Secondary | ICD-10-CM | POA: Diagnosis not present

## 2019-04-23 LAB — COMPREHENSIVE METABOLIC PANEL
ALT: 17 U/L (ref 0–44)
AST: 26 U/L (ref 15–41)
Albumin: 4 g/dL (ref 3.5–5.0)
Alkaline Phosphatase: 42 U/L (ref 38–126)
Anion gap: 12 (ref 5–15)
BUN: 27 mg/dL — ABNORMAL HIGH (ref 8–23)
CO2: 24 mmol/L (ref 22–32)
Calcium: 9.3 mg/dL (ref 8.9–10.3)
Chloride: 101 mmol/L (ref 98–111)
Creatinine, Ser: 1.3 mg/dL — ABNORMAL HIGH (ref 0.61–1.24)
GFR calc Af Amer: 59 mL/min — ABNORMAL LOW (ref >60)
GFR calc non Af Amer: 51 mL/min — ABNORMAL LOW (ref >60)
Glucose, Bld: 113 mg/dL — ABNORMAL HIGH (ref 70–99)
Potassium: 3.5 mmol/L (ref 3.5–5.1)
Sodium: 137 mmol/L (ref 135–145)
Total Bilirubin: 0.7 mg/dL (ref 0.3–1.2)
Total Protein: 6.9 g/dL (ref 6.5–8.1)

## 2019-04-23 LAB — CBC WITH DIFFERENTIAL/PLATELET
Abs Immature Granulocytes: 0.01 10*3/uL (ref 0.00–0.07)
Basophils Absolute: 0 10*3/uL (ref 0.0–0.1)
Basophils Relative: 1 %
Eosinophils Absolute: 0.2 10*3/uL (ref 0.0–0.5)
Eosinophils Relative: 3 %
HCT: 33.2 % — ABNORMAL LOW (ref 39.0–52.0)
Hemoglobin: 10.6 g/dL — ABNORMAL LOW (ref 13.0–17.0)
Immature Granulocytes: 0 %
Lymphocytes Relative: 26 %
Lymphs Abs: 1.5 10*3/uL (ref 0.7–4.0)
MCH: 27.3 pg (ref 26.0–34.0)
MCHC: 31.9 g/dL (ref 30.0–36.0)
MCV: 85.6 fL (ref 80.0–100.0)
Monocytes Absolute: 0.6 10*3/uL (ref 0.1–1.0)
Monocytes Relative: 11 %
Neutro Abs: 3.3 10*3/uL (ref 1.7–7.7)
Neutrophils Relative %: 59 %
Platelets: 208 10*3/uL (ref 150–400)
RBC: 3.88 MIL/uL — ABNORMAL LOW (ref 4.22–5.81)
RDW: 15.6 % — ABNORMAL HIGH (ref 11.5–15.5)
WBC: 5.6 10*3/uL (ref 4.0–10.5)
nRBC: 0 % (ref 0.0–0.2)

## 2019-04-23 LAB — BASIC METABOLIC PANEL
BUN: 25 mg/dL — ABNORMAL HIGH (ref 6–23)
CO2: 30 mEq/L (ref 19–32)
Calcium: 9.5 mg/dL (ref 8.4–10.5)
Chloride: 101 mEq/L (ref 96–112)
Creatinine, Ser: 1.27 mg/dL (ref 0.40–1.50)
GFR: 54.38 mL/min — ABNORMAL LOW (ref >60.00)
Glucose, Bld: 94 mg/dL (ref 70–99)
Potassium: 3.5 mEq/L (ref 3.5–5.1)
Sodium: 137 mEq/L (ref 135–145)

## 2019-04-23 LAB — PSA: Prostatic Specific Antigen: <0.01 ng/mL (ref 0.00–4.00)

## 2019-04-23 MED ORDER — CYANOCOBALAMIN 1000 MCG/ML IJ SOLN
1000.0000 ug | Freq: Once | INTRAMUSCULAR | Status: AC
  Administered 2019-04-23: 1000 ug via INTRAMUSCULAR

## 2019-04-23 NOTE — Progress Notes (Signed)
Patient presented for B 12 injection to right deltoid, patient voiced no concerns nor showed any signs of distress during injection. Patient recently had Covid vaccine in left arm.

## 2019-04-23 NOTE — Telephone Encounter (Signed)
Dr. Fletcher Anon, [place] saw this patient to establish cardiac care on 04/15/19. Recent cath in 2019 reviewed with patent stents in the LCx and distal RCA, normal EF. He is on ASA and plavix. You recommended echo for DOE. Echo is scheduled for 3/12. We have been asked for clearance for EGD on 05/07/19. I was unable to reach the patient to ask about dyspnea and ability to rest supine (left VM).   1. Can he hold plavix for procedure? 2. Do you recommend completion of echo prior to clearance?

## 2019-04-23 NOTE — Telephone Encounter (Signed)
Wife just wanted to know what he was having drawn today. Advised we are rechecking his kidney function.

## 2019-04-23 NOTE — Progress Notes (Signed)
[place] OFFICE PROGRESS NOTE  Patient Care Team: Einar Pheasant, MD as PCP - General (Internal Medicine) [name], MD as Consulting Physician (Cardiology)  Cancer Staging No matching staging information was found for the patient.   Oncology History Overview Note  Nov 2017- completed IM RT radiation therapy to his prostate and pelvic nodes for Gleason 7 (4+3) adenocarcinoma the prostate presenting the PSA of 7.8.  # JAN 2019- METASTATIC PROSTATE CA to Bone [low volume met on bone scan; CT- NED]; PSA ~50; March 25th 2019- Lupron 45 mg IM [Urology q 29M]; June 26, 2018-Eligard every 3 months[intolerance to Lupron/question A. Fib/injection site pain  # May 23rd Zytiga 276m/day; no prednisone  # Barretts- EGD [await Jan 2021- Dr.Toledo]; colo-- [id number]   # OBenita Stabile [place]]; Oct 2019- A.fib/not on eliquis; [Dr.Khan] -Dr.Arida   ------------------------------------------------------------------  DIAGNOSIS: [ JAN 2019]- Met- PROSTATE CANCER  STAGE:  IV       ;GOALS: PALLIATIVE  CURRENT/MOST RECENT THERAPY [ march 2019]- Lupron; May 23rd 2019- Zytiga 2539mday    [place]align[place] of prostate ([place]    INTERVAL HISTORY:  Nathaniel [place]89.o.  male pleasant patient above history of metastatic prostate cancer on ADT/Zytiga is here for follow-up.  Patient was taken off Zytiga approximately a month ago because of low potassium.  He is currently on potassium supplementation.  He denies any nausea vomiting abdominal pain.  Any worsening bone pain.  Appetite is good.  No recent hospitalizations.  Review of Systems  Constitutional: Negative for chills, diaphoresis, fever, malaise/fatigue and weight loss.  HENT: Negative for nosebleeds and sore throat.   Eyes: Negative for double vision.  Respiratory: Negative for cough, hemoptysis, sputum production, shortness of breath and wheezing.   Cardiovascular: Negative for chest pain, palpitations,  orthopnea and leg swelling.  Gastrointestinal: Negative for abdominal pain, blood in stool, constipation, diarrhea, heartburn, melena, nausea and vomiting.  Genitourinary: Negative for dysuria, frequency and urgency.  Musculoskeletal: Negative for back pain and joint pain.  Skin: Negative.  Negative for itching and rash.  Neurological: Positive for dizziness. Negative for tingling, focal weakness, weakness and headaches.  Endo/Heme/Allergies: Does not bruise/bleed easily.  Psychiatric/Behavioral: Negative for depression. The patient is not nervous/anxious and does not have insomnia.       PAST MEDICAL HISTORY :  Past Medical History:  Diagnosis Date  . Allergic rhinitis   . Barrett's esophagus 10/21/13  . Bone cancer ([facility] [place]  . Chronic kidney disease    stones  . Colon polyp, hyperplastic   . COPD (chronic obstructive pulmonary disease) (HCC)    mild COPD. former smoker  . Coronary artery disease   . Diverticulosis   . Fundic gland polyps of stomach, benign   . Gastritis   . GERD (gastroesophageal reflux disease)   . Hypercholesteremia   . Hypertension   . Prostate cancer ([facility]  . Prostatism   . Reflux esophagitis   . Renal stones   . Skin cancer    left cheek/lesion excised  . Sleep apnea   . TIA (transient ischemic attack)   . TIA (transient ischemic attack)     PAST SURGICAL HISTORY :   Past Surgical History:  Procedure Laterality Date  . CARDIAC CATHETERIZATION    . COLONOSCOPY WITH PROPOFOL N/A 09/13/2015   Procedure: COLONOSCOPY WITH PROPOFOL;  Surgeon: MaLollie SailsMD;  [facility];  Service: Endoscopy;  Laterality: N/A;  . CORONARY ANGIOPLASTY    . ESOPHAGOGASTRODUODENOSCOPY (  EGD) WITH PROPOFOL N/A 09/13/2015   Procedure: ESOPHAGOGASTRODUODENOSCOPY (EGD) WITH PROPOFOL;  Surgeon: Lollie Sails, MD;  Location[facility];  Service: Endoscopy;  Laterality: N/A;  . ESOPHAGOGASTRODUODENOSCOPY (EGD) WITH PROPOFOL N/A 12/28/2015   Procedure:  ESOPHAGOGASTRODUODENOSCOPY (EGD) WITH PROPOFOL;  Surgeon: Lollie Sails, MD;  Location: [facility] ENDOSCOPY;  Service: Endoscopy;  Laterality: N/A;  . ESOPHAGOGASTRODUODENOSCOPY (EGD) WITH PROPOFOL N/A 06/16/2016   Procedure: ESOPHAGOGASTRODUODENOSCOPY (EGD) WITH PROPOFOL;  Surgeon: Lollie Sails, MD;  Location: [facility] ENDOSCOPY;  Service: Endoscopy;  Laterality: N/A;  . EYE SURGERY  2013   CATARACT EXTRACTION  . GANGLION CYST EXCISION Right 05/18/2017   Procedure: REMOVAL GANGLION CYST ANKLE;  Surgeon: Samara Deist, DPM;  Location: [facility] ORS;  Service: Podiatry;  Laterality: Right;  . heart cath stent    . LEFT HEART CATH AND CORONARY ANGIOGRAPHY Right 12/03/2017   Procedure: Left Heart Cath and Coronary Angiography with possible coronary intervention;  Surgeon: Dionisio David, MD;  Location: [place] CV LAB;  Service: Cardiovascular;  Laterality: Right;  . PROSTATE BIOPSY    . stents     multiple    FAMILY HISTORY :   Family History  Problem Relation Age of Onset  . Cancer Mother        gastric and lung  . Cancer Father        multiple myeloma  . Stroke Father   . Cancer Sister        leukemia  . Cancer Brother        leukemia  . Cancer Brother        kidney  . Cancer Daughter        Uterine  . Cancer Other        [place] (Sister's Son): Prostate    SOCIAL HISTORY:   Social History   Tobacco Use  . Smoking status: Former Smoker    Packs/day: 1.00    Years: 35.00    Pack years: 35.00    Types: Cigarettes    Quit date: 03/06/1986    Years since quitting: 33.1  . Smokeless tobacco: Former Systems developer    Types: Chew  Substance Use Topics  . Alcohol use: No  . Drug use: No    ALLERGIES:  is allergic to atorvastatin.  MEDICATIONS:  Current Outpatient Medications  Medication Sig Dispense Refill  . amLODipine (NORVASC) 2.5 MG tablet TAKE 1 TABLET BY MOUTH ONCE DAILY (Patient taking differently: 10 mg daily. ) 90 tablet 1  . aspirin EC 81 MG tablet Take 81 mg by mouth  daily.    . calcium citrate-vitamin D (CITRACAL+D) 315-200 MG-UNIT tablet Take 1 tablet by mouth daily.    . chlorthalidone (HYGROTON) 25 MG tablet Take 25 mg by mouth daily.    . cholecalciferol (VITAMIN D) 1000 units tablet Take 1,000 Units by mouth daily.     . clopidogrel (PLAVIX) 75 MG tablet Take 1 tablet (75 mg total) by mouth daily. 90 tablet 0  . ezetimibe (ZETIA) 10 MG tablet Take 10 mg by mouth daily.    . fexofenadine (ALLEGRA) 180 MG tablet Take 180 mg by mouth daily as needed for allergies or rhinitis.    [name] isosorbide mononitrate (IMDUR) 30 MG 24 hr tablet TAKE 1 TABLET BY MOUTH ONCE DAILY 90 tablet 2  . losartan (COZAAR) 50 MG tablet Take 1 tablet (50 mg total) by mouth daily. 90 tablet 1  . lovastatin (MEVACOR) 40 MG tablet TAKE 2 TABLETS BY MOUTH AT BEDTIME 180 tablet  1  . Multiple Vitamin (MULTI-VITAMINS) TABS Take 1 tablet by mouth daily.     . pantoprazole (PROTONIX) 40 MG tablet Take 40 mg by mouth 2 (two) times daily.     [name] abiraterone acetate (ZYTIGA) 250 MG tablet TAKE 1 TABLET (250 MG TOTAL) BY MOUTH DAILY. (Patient not taking: Reported on 04/22/2019) 30 tablet 4  . potassium chloride SA (KLOR-CON) 20 MEQ tablet TAKE 1 TABLET BY MOUTH ONCE DAILY 30 tablet 0   No current facility-administered medications for this visit.    PHYSICAL EXAMINATION: ECOG PERFORMANCE STATUS: 0 - Asymptomatic  BP 137/73 (BP Location: Right Arm, Patient Position: Sitting)   Pulse 82   Temp (!) 96.1 F (35.6 C) (Tympanic)   Wt 211 lb (95.7 kg)   BMI 30.28 kg/m   Filed Weights   04/23/19 1357  Weight: 211 lb (95.7 kg)    Physical Exam  Constitutional: He is oriented to person, place, and time and well-developed, well-nourished, and in no distress.  He is alone.  Is walking himself.  HENT:  Head: Normocephalic and atraumatic.  Mouth/Throat: Oropharynx is clear and moist. No oropharyngeal exudate.  Eyes: Pupils are equal, round, and reactive to light.  Cardiovascular: Normal rate  and regular rhythm.  Pulmonary/Chest: No respiratory distress. He has no wheezes.  Abdominal: Soft. Bowel sounds are normal. He exhibits no distension and no mass. There is no abdominal tenderness. There is no rebound and no guarding.  Musculoskeletal:        General: No tenderness or edema. Normal range of motion.     Cervical back: Normal range of motion and neck supple.  Neurological: He is alert and oriented to person, place, and time.  Skin: Skin is warm.  Psychiatric: Affect normal.      LABORATORY DATA:  I have reviewed the data as listed    Component Value Date/Time   NA 137 04/23/2019 1326   NA 140 09/16/2013 0438   K 3.5 04/23/2019 1326   K 4.2 09/16/2013 0438   CL 101 04/23/2019 1326   CL 109 (H) 09/16/2013 0438   CO2 24 04/23/2019 1326   CO2 24 09/16/2013 0438   GLUCOSE 113 (H) 04/23/2019 1326   GLUCOSE 101 (H) 09/16/2013 0438   BUN 27 (H) 04/23/2019 1326   BUN 20 (H) 09/16/2013 0438   CREATININE 1.30 (H) 04/23/2019 1326   CREATININE 1.28 09/16/2013 0438   CALCIUM 9.3 04/23/2019 1326   CALCIUM 8.3 (L) 09/16/2013 0438   PROT 6.9 04/23/2019 1326   PROT 6.7 09/15/2013 0755   ALBUMIN 4.0 04/23/2019 1326   ALBUMIN 3.5 09/15/2013 0755   AST 26 04/23/2019 1326   AST 37 09/15/2013 0755   ALT 17 04/23/2019 1326   ALT 28 09/15/2013 0755   ALKPHOS 42 04/23/2019 1326   ALKPHOS 44 (L) 09/15/2013 0755   BILITOT 0.7 04/23/2019 1326   BILITOT 0.5 09/15/2013 0755   GFRNONAA 51 (L) 04/23/2019 1326   GFRNONAA 54 (L) 09/16/2013 0438   GFRAA 59 (L) 04/23/2019 1326   GFRAA >60 09/16/2013 0438    No results found for: SPEP, UPEP  Lab Results  Component Value Date   WBC 5.6 04/23/2019   NEUTROABS 3.3 04/23/2019   HGB 10.6 (L) 04/23/2019   HCT 33.2 (L) 04/23/2019   MCV 85.6 04/23/2019   PLT 208 04/23/2019      Chemistry      Component Value Date/Time   NA 137 04/23/2019 1326   NA 140 09/16/2013 0438  K 3.5 04/23/2019 1326   K 4.2 09/16/2013 0438   CL 101  04/23/2019 1326   CL 109 (H) 09/16/2013 0438   CO2 24 04/23/2019 1326   CO2 24 09/16/2013 0438   BUN 27 (H) 04/23/2019 1326   BUN 20 (H) 09/16/2013 0438   CREATININE 1.30 (H) 04/23/2019 1326   CREATININE 1.28 09/16/2013 0438      Component Value Date/Time   CALCIUM 9.3 04/23/2019 1326   CALCIUM 8.3 (L) 09/16/2013 0438   ALKPHOS 42 04/23/2019 1326   ALKPHOS 44 (L) 09/15/2013 0755   AST 26 04/23/2019 1326   AST 37 09/15/2013 0755   ALT 17 04/23/2019 1326   ALT 28 09/15/2013 0755   BILITOT 0.7 04/23/2019 1326   BILITOT 0.5 09/15/2013 0755       RADIOGRAPHIC STUDIES: I have personally reviewed the radiological images as listed and agreed with the findings in the report. No results found.   ASSESSMENT & PLAN:  Malignant neoplasm of prostate ([place]) # Castrate sensitive-metastatic prostate cancer to the bone. Stage IV-on ADT/Zytiga low-dose. Bone scan October 2020-stable positive response. FEB 2021- PSA- <0.01.  Stable.  #Given improvement of hypokalemia/I think is reasonable to restart Zytiga.   # Hypokalemia- sec to Zytiga; continue Kudr 20 meq BID.[name]  # Iron def Anemia-hemoglobin 10.6; currently on p.o. iron.  See below/regarding endoscopies.  If getting worse at next visit would recommend IV iron.  #History of Barrett's esophagus-stable.  Awaiting repeat EGD/ colonoscopy in early march.   # A. fib paroxysmal-currently in sinus rhythm. on  Aspirin Plavix.  Stable.    # DISPOSITION:   # HOLD Venofer today.  # follow up in 1 months; labs-cbc/cmp/PSA; possible venofer- Dr.B  Cc; Dr.Scott    Orders Placed This Encounter  Procedures  . CBC with Differential    Standing Status:   Future    Standing Expiration Date:   04/22/2020  . Comprehensive metabolic panel    Standing Status:   Future    Standing Expiration Date:   04/22/2020  . PSA    Standing Status:   Future    Standing Expiration Date:   04/22/2020   All questions were answered. The patient knows to call the  clinic with any problems, questions or concerns.      Cammie Sickle, MD 04/24/2019 9:44 AM

## 2019-04-23 NOTE — Assessment & Plan Note (Addendum)
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 Bone scan October 2020-stable positive response. FEB 2021- PSA- <0.01.  Stable.  #Given improvement of hypokalemia/I think is reasonable to restart Zytiga.   # Hypokalemia- sec to Zytiga; continue Kudr 20 meq BID.[name]  # Iron def Anemia-hemoglobin 10.6; currently on p.o. iron.  See below/regarding endoscopies.  If getting worse at next visit would recommend IV iron.  #History of Barrett's esophagus-stable.  Awaiting repeat EGD/ colonoscopy in early march.   # A. fib paroxysmal-currently in sinus rhythm. on  Aspirin Plavix.  Stable.    # DISPOSITION:   # HOLD Venofer today.  # follow up in 1 months; labs-cbc/cmp/PSA; possible venofer- Dr.B  Cc; Dr.Scott

## 2019-04-23 NOTE — Telephone Encounter (Signed)
)    Ref Range & Units 13:26  Potassium 3.5 - 5.1 mmol/L 3.5

## 2019-04-24 ENCOUNTER — Encounter: Payer: Self-pay | Admitting: Internal Medicine

## 2019-04-24 ENCOUNTER — Other Ambulatory Visit: Payer: Medicare HMO

## 2019-04-24 NOTE — Telephone Encounter (Signed)
   Primary Cardiologist: Kathlyn [place], MD  Chart reviewed as part of pre-operative protocol coverage. Patient was contacted 04/24/2019 in reference to pre-operative risk assessment for pending surgery as outlined below.  Nathaniel Thompson was last seen on 04/15/19 by Dr. Fletcher Anon.    Per Dr. Fletcher Anon: Can proceed an overall low risk even without doing the echo before. Can hold Plavix 5 days before the procedure.  Therefore, based on ACC/AHA guidelines, the patient would be at acceptable risk for the planned procedure without further cardiovascular testing.   I will route this recommendation to the requesting party via Epic fax function and remove from pre-op pool.  Please call with questions.  Tami Lin Khamila Bassinger, PA 04/24/2019, 1:42 PM

## 2019-04-24 NOTE — Telephone Encounter (Signed)
Can proceed an overall low risk even without doing the echo before. Can hold Plavix 5 days before the procedure.

## 2019-04-24 NOTE — Telephone Encounter (Signed)
Follow up     Pts wife is returning call   Please call back  

## 2019-04-28 ENCOUNTER — Telehealth: Payer: Self-pay | Admitting: Cardiovascular Disease

## 2019-04-28 NOTE — Telephone Encounter (Signed)
   [facility] Pre-operative Risk Assessment    Request for surgical clearance:  1. What type of surgery is being performed? UPPER ENDOSCOPY  2. When is this surgery scheduled? 05/07/19  3. What type of clearance is required (medical clearance vs. Pharmacy clearance to hold med vs. Both)? BOTH  4. Are there any medications that need to be held prior to surgery and how long? PLAVIX . 5 DAYS  5. Practice name and name of physician performing surgery? [facility]  6. What is your office phone number [phone]   7.   What is your office fax number [phone]  8.   Anesthesia type (None, local, MAC, general) ? NOT LISTED   Lucienne Minks 04/28/2019, 8:42 AM  _________________________________________________________________   (provider comments below)

## 2019-04-28 NOTE — Telephone Encounter (Signed)
Patient's wife, Holley Raring, is requesting a call back. She states she forgot to ask about an Iron supplement that her husband is taking.

## 2019-04-28 NOTE — Telephone Encounter (Signed)
Spoke with pt's wife [place] and advised per Cecilie Kicks to contact the GI provider re: iron as they do sometimes stop that medication.  Pt's wife states according to paperwork pt should hold his iron x 7 days according to GI.  Reiterated to pt's wife hold iron as directed by GI.  Pt's wife verbalizes understanding.

## 2019-04-28 NOTE — Telephone Encounter (Signed)
Let pt's wife know she needs to ask GI about Iron-they sometimes have them stop.

## 2019-04-28 NOTE — Telephone Encounter (Signed)
I re-sent clearance from earlier this month and talked to pt and his wife.

## 2019-04-30 ENCOUNTER — Ambulatory Visit (INDEPENDENT_AMBULATORY_CARE_PROVIDER_SITE_OTHER): Payer: Medicare HMO

## 2019-04-30 ENCOUNTER — Other Ambulatory Visit: Payer: Self-pay

## 2019-04-30 DIAGNOSIS — R944 Abnormal results of kidney function studies: Secondary | ICD-10-CM

## 2019-04-30 DIAGNOSIS — E538 Deficiency of other specified B group vitamins: Secondary | ICD-10-CM | POA: Diagnosis not present

## 2019-04-30 MED ORDER — CYANOCOBALAMIN 1000 MCG/ML IJ SOLN
1000.0000 ug | Freq: Once | INTRAMUSCULAR | Status: AC
  Administered 2019-04-30: 09:00:00 1000 ug via INTRAMUSCULAR

## 2019-04-30 NOTE — Progress Notes (Addendum)
Patient presented for B 12 injection to left deltoid, patient voiced no concerns nor showed any signs of distress during injection.  Reviewed.  Dr Scott 

## 2019-05-05 ENCOUNTER — Other Ambulatory Visit
Admission: RE | Admit: 2019-05-05 | Discharge: 2019-05-05 | Disposition: A | Discharge status: Home / Self Care | Payer: Medicare HMO | Source: Ambulatory Visit | Attending: General Surgery | Admitting: General Surgery

## 2019-05-05 ENCOUNTER — Other Ambulatory Visit: Payer: Self-pay

## 2019-05-05 DIAGNOSIS — Z01812 Encounter for preprocedural laboratory examination: Secondary | ICD-10-CM | POA: Diagnosis present

## 2019-05-05 DIAGNOSIS — Z20822 Contact with and (suspected) exposure to covid-19: Secondary | ICD-10-CM | POA: Diagnosis not present

## 2019-05-05 LAB — SARS CORONAVIRUS 2 (TAT 6-24 HRS): SARS Coronavirus 2: NEGATIVE

## 2019-05-07 ENCOUNTER — Ambulatory Visit
Admission: RE | Admit: 2019-05-07 | Discharge: 2019-05-07 | Disposition: A | Discharge status: Home / Self Care | Payer: Medicare HMO | Source: Ambulatory Visit | Attending: General Surgery | Admitting: General Surgery

## 2019-05-07 ENCOUNTER — Encounter
Admission: RE | Disposition: A | Discharge status: Home / Self Care | Payer: Self-pay | Source: Ambulatory Visit | Attending: General Surgery

## 2019-05-07 ENCOUNTER — Ambulatory Visit: Payer: Medicare HMO | Admitting: Registered Nurse

## 2019-05-07 ENCOUNTER — Encounter: Payer: Self-pay | Admitting: General Surgery

## 2019-05-07 ENCOUNTER — Other Ambulatory Visit: Payer: Self-pay

## 2019-05-07 DIAGNOSIS — Z8546 Personal history of malignant neoplasm of prostate: Secondary | ICD-10-CM | POA: Diagnosis not present

## 2019-05-07 DIAGNOSIS — Z87891 Personal history of nicotine dependence: Secondary | ICD-10-CM | POA: Insufficient documentation

## 2019-05-07 DIAGNOSIS — Z79899 Other long term (current) drug therapy: Secondary | ICD-10-CM | POA: Insufficient documentation

## 2019-05-07 DIAGNOSIS — K317 Polyp of stomach and duodenum: Secondary | ICD-10-CM | POA: Diagnosis not present

## 2019-05-07 DIAGNOSIS — K449 Diaphragmatic hernia without obstruction or gangrene: Secondary | ICD-10-CM | POA: Diagnosis not present

## 2019-05-07 DIAGNOSIS — Z86010 Personal history of colonic polyps: Secondary | ICD-10-CM | POA: Diagnosis not present

## 2019-05-07 DIAGNOSIS — K644 Residual hemorrhoidal skin tags: Secondary | ICD-10-CM | POA: Diagnosis not present

## 2019-05-07 DIAGNOSIS — J449 Chronic obstructive pulmonary disease, unspecified: Secondary | ICD-10-CM | POA: Insufficient documentation

## 2019-05-07 DIAGNOSIS — I2510 Atherosclerotic heart disease of native coronary artery without angina pectoris: Secondary | ICD-10-CM | POA: Diagnosis not present

## 2019-05-07 DIAGNOSIS — K2270 Barrett's esophagus without dysplasia: Secondary | ICD-10-CM | POA: Diagnosis not present

## 2019-05-07 DIAGNOSIS — Z7982 Long term (current) use of aspirin: Secondary | ICD-10-CM | POA: Insufficient documentation

## 2019-05-07 DIAGNOSIS — Y842 Radiological procedure and radiotherapy as the cause of abnormal reaction of the patient, or of later complication, without mention of misadventure at the time of the procedure: Secondary | ICD-10-CM | POA: Insufficient documentation

## 2019-05-07 DIAGNOSIS — Z8673 Personal history of transient ischemic attack (TIA), and cerebral infarction without residual deficits: Secondary | ICD-10-CM | POA: Insufficient documentation

## 2019-05-07 DIAGNOSIS — N189 Chronic kidney disease, unspecified: Secondary | ICD-10-CM | POA: Insufficient documentation

## 2019-05-07 DIAGNOSIS — D509 Iron deficiency anemia, unspecified: Secondary | ICD-10-CM | POA: Diagnosis not present

## 2019-05-07 DIAGNOSIS — Z87442 Personal history of urinary calculi: Secondary | ICD-10-CM | POA: Diagnosis not present

## 2019-05-07 DIAGNOSIS — K640 First degree hemorrhoids: Secondary | ICD-10-CM | POA: Diagnosis not present

## 2019-05-07 DIAGNOSIS — K627 Radiation proctitis: Secondary | ICD-10-CM | POA: Diagnosis not present

## 2019-05-07 DIAGNOSIS — K5909 Other constipation: Secondary | ICD-10-CM | POA: Insufficient documentation

## 2019-05-07 DIAGNOSIS — J309 Allergic rhinitis, unspecified: Secondary | ICD-10-CM | POA: Insufficient documentation

## 2019-05-07 DIAGNOSIS — K5730 Diverticulosis of large intestine without perforation or abscess without bleeding: Secondary | ICD-10-CM | POA: Diagnosis not present

## 2019-05-07 DIAGNOSIS — Z888 Allergy status to other drugs, medicaments and biological substances status: Secondary | ICD-10-CM | POA: Insufficient documentation

## 2019-05-07 DIAGNOSIS — G4730 Sleep apnea, unspecified: Secondary | ICD-10-CM | POA: Diagnosis not present

## 2019-05-07 DIAGNOSIS — I129 Hypertensive chronic kidney disease with stage 1 through stage 4 chronic kidney disease, or unspecified chronic kidney disease: Secondary | ICD-10-CM | POA: Insufficient documentation

## 2019-05-07 DIAGNOSIS — K2100 Gastro-esophageal reflux disease with esophagitis, without bleeding: Secondary | ICD-10-CM | POA: Diagnosis not present

## 2019-05-07 DIAGNOSIS — Z9849 Cataract extraction status, unspecified eye: Secondary | ICD-10-CM | POA: Insufficient documentation

## 2019-05-07 DIAGNOSIS — E7800 Pure hypercholesterolemia, unspecified: Secondary | ICD-10-CM | POA: Insufficient documentation

## 2019-05-07 DIAGNOSIS — Z85828 Personal history of other malignant neoplasm of skin: Secondary | ICD-10-CM | POA: Diagnosis not present

## 2019-05-07 HISTORY — PX: COLONOSCOPY WITH PROPOFOL: SHX5780

## 2019-05-07 HISTORY — PX: ESOPHAGOGASTRODUODENOSCOPY (EGD) WITH PROPOFOL: SHX5813

## 2019-05-07 SURGERY — ESOPHAGOGASTRODUODENOSCOPY (EGD) WITH PROPOFOL
Anesthesia: General

## 2019-05-07 MED ORDER — EPHEDRINE SULFATE 50 MG/ML IJ SOLN
INTRAMUSCULAR | Status: DC | PRN
  Administered 2019-05-07: 10 mg via INTRAVENOUS

## 2019-05-07 MED ORDER — PROPOFOL 500 MG/50ML IV EMUL
INTRAVENOUS | Status: DC | PRN
  Administered 2019-05-07: 140 ug/kg/min via INTRAVENOUS

## 2019-05-07 MED ORDER — SODIUM CHLORIDE 0.9 % IV SOLN: INTRAVENOUS | Status: DC

## 2019-05-07 MED ORDER — PROPOFOL 10 MG/ML IV BOLUS
INTRAVENOUS | Status: DC | PRN
  Administered 2019-05-07: 70 mg via INTRAVENOUS

## 2019-05-07 MED ORDER — PHENYLEPHRINE HCL (PRESSORS) 10 MG/ML IV SOLN
INTRAVENOUS | Status: DC | PRN
  Administered 2019-05-07: 100 ug via INTRAVENOUS

## 2019-05-07 MED ORDER — LIDOCAINE HCL (CARDIAC) PF 100 MG/5ML IV SOSY
PREFILLED_SYRINGE | INTRAVENOUS | Status: DC | PRN
  Administered 2019-05-07: 100 mg via INTRAVENOUS

## 2019-05-07 NOTE — Anesthesia Preprocedure Evaluation (Signed)
Anesthesia Evaluation  Patient identified by MRN, date of birth, ID band Patient awake    Reviewed: Allergy & Precautions, H&P , NPO status , Patient's Chart, lab work & pertinent test results  History of Anesthesia Complications Negative for: history of anesthetic complications  Airway Mallampati: III  TM Distance: <3 FB Neck ROM: limited    Dental  (+) Chipped, Poor Dentition, Missing, Edentulous Upper   Pulmonary neg shortness of breath, sleep apnea , COPD, former smoker,           Cardiovascular Exercise Tolerance: Good hypertension, (-) angina+ CAD, + Past MI and + Cardiac Stents  (-) DOE      Neuro/Psych TIAnegative psych ROS   GI/Hepatic Neg liver ROS, GERD  Medicated and Controlled,  Endo/Other  negative endocrine ROS  Renal/GU CRFRenal disease  negative genitourinary   Musculoskeletal   Abdominal   Peds  Hematology negative hematology ROS (+)   Anesthesia Other Findings Past Medical History: No date: Allergic rhinitis 10/21/13: Barrett's esophagus No date: Bone cancer ([place]) No date: Chronic kidney disease     Comment:  stones No date: Colon polyp, hyperplastic No date: COPD (chronic obstructive pulmonary disease) (HCC)     Comment:  mild COPD. former smoker No date: Coronary artery disease No date: Diverticulosis No date: Fundic gland polyps of stomach, benign No date: Gastritis No date: GERD (gastroesophageal reflux disease) No date: Hypercholesteremia No date: Hypertension No date: Prostate cancer (HCC) No date: Prostatism No date: Reflux esophagitis No date: Renal stones No date: Skin cancer     Comment:  left cheek/lesion excised No date: Sleep apnea No date: TIA (transient ischemic attack) No date: TIA (transient ischemic attack)  Past Surgical History: No date: CARDIAC CATHETERIZATION 09/13/2015: COLONOSCOPY WITH PROPOFOL; N/A     Comment:  Procedure: COLONOSCOPY WITH PROPOFOL;   Surgeon: Lollie Sails, MD;  Location: [facility] ENDOSCOPY;  Service:               Endoscopy;  Laterality: N/A; No date: CORONARY ANGIOPLASTY 09/13/2015: ESOPHAGOGASTRODUODENOSCOPY (EGD) WITH PROPOFOL; N/A     Comment:  Procedure: ESOPHAGOGASTRODUODENOSCOPY (EGD) WITH               PROPOFOL;  Surgeon: Lollie Sails, MD;  Location:               [facility] ENDOSCOPY;  Service: Endoscopy;  Laterality: N/A; 12/28/2015: ESOPHAGOGASTRODUODENOSCOPY (EGD) WITH PROPOFOL; N/A     Comment:  Procedure: ESOPHAGOGASTRODUODENOSCOPY (EGD) WITH               PROPOFOL;  Surgeon: Lollie Sails, MD;  Location:               [facility] ENDOSCOPY;  Service: Endoscopy;  Laterality: N/A; 06/16/2016: ESOPHAGOGASTRODUODENOSCOPY (EGD) WITH PROPOFOL; N/A     Comment:  Procedure: ESOPHAGOGASTRODUODENOSCOPY (EGD) WITH               PROPOFOL;  Surgeon: Lollie Sails, MD;  Location:               [facility] ENDOSCOPY;  Service: Endoscopy;  Laterality: N/A; 2013: EYE SURGERY     Comment:  CATARACT EXTRACTION 05/18/2017: GANGLION CYST EXCISION; Right     Comment:  Procedure: REMOVAL GANGLION CYST ANKLE;  Surgeon:               Samara Deist, DPM;  Location: [facility] ORS;  Service:  Podiatry;  Laterality: Right; No date: heart cath stent 12/03/2017: LEFT HEART CATH AND CORONARY ANGIOGRAPHY; Right     Comment:  Procedure: Left Heart Cath and Coronary Angiography with              possible coronary intervention;  Surgeon: Dionisio David, MD;  Location: [place] CV LAB;  Service:               Cardiovascular;  Laterality: Right; No date: PROSTATE BIOPSY No date: stents     Comment:  multiple  BMI    Body Mass Index: 29.41 kg/m      Reproductive/Obstetrics negative OB ROS                             Anesthesia Physical Anesthesia Plan  ASA: III  Anesthesia Plan: General   Post-op Pain Management:    Induction: Intravenous  PONV Risk Score and  Plan: Propofol infusion and TIVA  Airway Management Planned: Natural Airway and Nasal Cannula  Additional Equipment:   Intra-op Plan:   Post-operative Plan:   Informed Consent: I have reviewed the patients History and Physical, chart, labs and discussed the procedure including the risks, benefits and alternatives for the proposed anesthesia with the patient or authorized representative who has indicated his/her understanding and acceptance.     Dental Advisory Given  Plan Discussed with: Anesthesiologist, CRNA and Surgeon  Anesthesia Plan Comments: (Patient consented for risks of anesthesia including but not limited to:  - adverse reactions to medications - risk of intubation if required - damage to teeth, lips or other oral mucosa - sore throat or hoarseness - Damage to heart, brain, lungs or loss of life  Patient voiced understanding.)        Anesthesia Quick Evaluation

## 2019-05-07 NOTE — Op Note (Signed)
[facility] Gastroenterology Patient Name: Nathaniel Thompson Procedure Date: 05/07/2019 8:35 AM MRN: [id number] Account #: [id number] Date of Birth: Sep 25, 1935 Admit Type: Outpatient Age: 84 Room: [facility] ENDO ROOM 1 Gender: Male Note Status: Finalized Procedure:             Upper GI endoscopy Indications:           Iron deficiency anemia, Follow-up of Barrett's                         esophagus Providers:             Robert Bellow, MD Referring MD:          Einar Pheasant, MD (Referring MD) Medicines:             Monitored Anesthesia Care Complications:         No immediate complications. Procedure:             Pre-Anesthesia Assessment:                        - Prior to the procedure, a History and Physical was                         performed, and patient medications, allergies and                         sensitivities were reviewed. The patient's tolerance                         of previous anesthesia was reviewed.                        - The risks and benefits of the procedure and the                         sedation options and risks were discussed with the                         patient. All questions were answered and informed                         consent was obtained.                        After obtaining informed consent, the endoscope was                         passed under direct vision. Throughout the procedure,                         the patient's blood pressure, pulse, and oxygen                         saturations were monitored continuously. The Endoscope                         was introduced through the mouth, and advanced to the                         third part of duodenum.  The upper GI endoscopy was                         accomplished without difficulty. The patient tolerated                         the procedure well. Findings:      A medium-sized hiatal hernia was present.      LA Grade C (one or more mucosal breaks continuous  between tops of 2 or       more mucosal folds, less than 75% circumference) esophagitis with no       bleeding was found 34 to 35 cm from the incisors. Mucosa was biopsied       with a cold forceps for histology in 3 sections at intervals of 2 cm at       32 and 34 cm from the incisors. A total of 2 specimen bottles were sent       to pathology.      The stomach was normal.      The examined duodenum was normal. Impression:            - Medium-sized hiatal hernia.                        - LA Grade C chronic esophagitis with no bleeding.                         Rule out Barrett's esophagus. Biopsied.                        - Normal stomach.                        - Normal examined duodenum. Recommendation:        - Perform a colonoscopy today. Procedure Code(s):     --- Professional ---                        [phone], Esophagogastroduodenoscopy, flexible,                         transoral; with biopsy, single or multiple Diagnosis Code(s):     --- Professional ---                        K44.9, Diaphragmatic hernia without obstruction or                         gangrene                        K20.90                        K22.70, Barrett's esophagus without dysplasia                        D50.9, Iron deficiency anemia, unspecified CPT copyright 2019 American Medical Association. All rights reserved. The codes documented in this report are preliminary and upon coder review may  be revised to meet current compliance requirements. Robert Bellow, MD 05/07/2019 9:00:18 AM This report has been signed electronically. Number of Addenda: 0 Note Initiated On: 05/07/2019  8:35 AM Estimated Blood Loss:  Estimated blood loss: none.      [facility]

## 2019-05-07 NOTE — H&P (Addendum)
Nathaniel Thompson [id number] 1936/01/24     HPI:  Patient with Barrett's esophagus, some recent dysphagia.  Normal UGI. For EGD.  Recently identified iron deficiency anemia is the indication for the repeat colonoscopy.   Medications Prior to Admission  Medication Sig Dispense Refill Last Dose  . abiraterone acetate (ZYTIGA) 250 MG tablet TAKE 1 TABLET (250 MG TOTAL) BY MOUTH DAILY. 30 tablet 4 05/06/2019 at Unknown time  . amLODipine (NORVASC) 2.5 MG tablet TAKE 1 TABLET BY MOUTH ONCE DAILY (Patient taking differently: 10 mg daily. ) 90 tablet 1 05/07/2019 at Unknown time  . aspirin EC 81 MG tablet Take 81 mg by mouth daily.   05/06/2019 at Unknown time  . calcium citrate-vitamin D (CITRACAL+D) 315-200 MG-UNIT tablet Take 1 tablet by mouth daily.   05/06/2019 at Unknown time  . chlorthalidone (HYGROTON) 25 MG tablet Take 25 mg by mouth daily.   05/06/2019 at Unknown time  . cholecalciferol (VITAMIN D) 1000 units tablet Take 1,000 Units by mouth daily.    05/06/2019 at Unknown time  . ezetimibe (ZETIA) 10 MG tablet Take 10 mg by mouth daily.   05/06/2019 at Unknown time  . fexofenadine (ALLEGRA) 180 MG tablet Take 180 mg by mouth daily as needed for allergies or rhinitis.   05/06/2019 at Unknown time  . fluticasone (FLONASE) 50 MCG/ACT nasal spray Place 2 sprays into both nostrils daily.   Past Month at Unknown time  . isosorbide mononitrate (IMDUR) 30 MG 24 hr tablet TAKE 1 TABLET BY MOUTH ONCE DAILY 90 tablet 2 05/06/2019 at Unknown time  . losartan (COZAAR) 50 MG tablet Take 1 tablet (50 mg total) by mouth daily. 90 tablet 1 05/06/2019 at Unknown time  . lovastatin (MEVACOR) 40 MG tablet TAKE 2 TABLETS BY MOUTH AT BEDTIME 180 tablet 1 05/06/2019 at Unknown time  . Multiple Vitamin (MULTI-VITAMINS) TABS Take 1 tablet by mouth daily.    05/06/2019 at Unknown time  . pantoprazole (PROTONIX) 40 MG tablet Take 40 mg by mouth 2 (two) times daily.    05/06/2019 at Unknown time  . potassium chloride SA (KLOR-CON) 20 MEQ tablet  TAKE 1 TABLET BY MOUTH ONCE DAILY 30 tablet 0 05/06/2019 at Unknown time  . clopidogrel (PLAVIX) 75 MG tablet Take 1 tablet (75 mg total) by mouth daily. 90 tablet 0 05/01/2019  . nitroGLYCERIN (NITROSTAT) 0.4 MG SL tablet Place 0.4 mg under the tongue every 5 (five) minutes as needed for chest pain.   Not Taking at Unknown time  . POLYETHYLENE GLYCOL 3350 PO Take by mouth.   Not Taking at Unknown time   Allergies  Allergen Reactions  . Atorvastatin Other (See Comments)    Achy joints   Past Medical History:  Diagnosis Date  . Allergic rhinitis   . Barrett's esophagus 10/21/13  . Bone cancer ([place])   . Chronic kidney disease    stones  . Colon polyp, hyperplastic   . COPD (chronic obstructive pulmonary disease) (HCC)    mild COPD. former smoker  . Coronary artery disease   . Diverticulosis   . Fundic gland polyps of stomach, benign   . Gastritis   . GERD (gastroesophageal reflux disease)   . Hypercholesteremia   . Hypertension   . Prostate cancer ([place])   . Prostatism   . Reflux esophagitis   . Renal stones   . Skin cancer    left cheek/lesion excised  . Sleep apnea   . TIA (transient ischemic attack)   .  TIA (transient ischemic attack)    Past Surgical History:  Procedure Laterality Date  . CARDIAC CATHETERIZATION    . COLONOSCOPY WITH PROPOFOL N/A 09/13/2015   Procedure: COLONOSCOPY WITH PROPOFOL;  Surgeon: Lollie Sails, MD;  Location: [facility] ENDOSCOPY;  Service: Endoscopy;  Laterality: N/A;  . CORONARY ANGIOPLASTY    . ESOPHAGOGASTRODUODENOSCOPY (EGD) WITH PROPOFOL N/A 09/13/2015   Procedure: ESOPHAGOGASTRODUODENOSCOPY (EGD) WITH PROPOFOL;  Surgeon: Lollie Sails, MD;  Location: [facility] ENDOSCOPY;  Service: Endoscopy;  Laterality: N/A;  . ESOPHAGOGASTRODUODENOSCOPY (EGD) WITH PROPOFOL N/A 12/28/2015   Procedure: ESOPHAGOGASTRODUODENOSCOPY (EGD) WITH PROPOFOL;  Surgeon: Lollie Sails, MD;  Location: [facility] ENDOSCOPY;  Service: Endoscopy;  Laterality: N/A;  .  ESOPHAGOGASTRODUODENOSCOPY (EGD) WITH PROPOFOL N/A 06/16/2016   Procedure: ESOPHAGOGASTRODUODENOSCOPY (EGD) WITH PROPOFOL;  Surgeon: Lollie Sails, MD;  Location: [facility] ENDOSCOPY;  Service: Endoscopy;  Laterality: N/A;  . EYE SURGERY  2013   CATARACT EXTRACTION  . GANGLION CYST EXCISION Right 05/18/2017   Procedure: REMOVAL GANGLION CYST ANKLE;  Surgeon: Samara Deist, DPM;  Location: [facility] ORS;  Service: Podiatry;  Laterality: Right;  . heart cath stent    . LEFT HEART CATH AND CORONARY ANGIOGRAPHY Right 12/03/2017   Procedure: Left Heart Cath and Coronary Angiography with possible coronary intervention;  Surgeon: Dionisio David, MD;  Location: [facility];  Service: Cardiovascular;  Laterality: Right;  . PROSTATE BIOPSY    . stents     multiple   Social History   Socioeconomic History  . Marital status: Married    Spouse name: Not on file  . Number of children: Not on file  . Years of education: Not on file  . Highest education level: Not on file  Occupational History  . Not on file  Tobacco Use  . Smoking status: Former Smoker    Packs/day: 1.00    Years: 35.00    Pack years: 35.00    Types: Cigarettes    Quit date: 03/06/1986    Years since quitting: 33.1  . Smokeless tobacco: Former Systems developer    Types: Chew  Substance and Sexual Activity  . Alcohol use: No  . Drug use: No  . Sexual activity: Not Currently  Other Topics Concern  . Not on file  Social History Narrative  . Not on file   Social Determinants of Health   Financial Resource Strain:   . Difficulty of Paying Living Expenses: Not on file  Food Insecurity:   . Worried About Charity fundraiser in the Last Year: Not on file  . Ran Out of Food in the Last Year: Not on file  Transportation Needs:   . Lack of Transportation (Medical): Not on file  . Lack of Transportation (Non-Medical): Not on file  Physical Activity:   . Days of Exercise per Week: Not on file  . Minutes of Exercise per Session: Not on  file  Stress:   . Feeling of Stress : Not on file  Social Connections:   . Frequency of Communication with Friends and Family: Not on file  . Frequency of Social Gatherings with Friends and Family: Not on file  . Attends Religious Services: Not on file  . Active Member of Clubs or Organizations: Not on file  . Attends Archivist Meetings: Not on file  . Marital Status: Not on file  Intimate Partner Violence:   . Fear of Current or Ex-Partner: Not on file  . Emotionally Abused: Not on file  . Physically Abused:  Not on file  . Sexually Abused: Not on file   Social History   Social History Narrative  . Not on file     ROS: Negative.     PE: HEENT: Negative. Lungs: Clear. Cardio: RR.  Assessment/Plan:  Proceed with planned upper and lower. endoscopy.  Forest Gleason [facility] 05/07/2019

## 2019-05-07 NOTE — Transfer of Care (Signed)
Immediate Anesthesia Transfer of Care Note  Patient: Nathaniel Thompson  Procedure(s) Performed: ESOPHAGOGASTRODUODENOSCOPY (EGD) WITH PROPOFOL (N/A ) COLONOSCOPY WITH PROPOFOL (N/A )  Patient Location: PACU  Anesthesia Type:General  Level of Consciousness: sedated  Airway & Oxygen Therapy: Patient Spontanous Breathing and Patient connected to nasal cannula oxygen  Post-op Assessment: Report given to RN and Post -op Vital signs reviewed and stable  Post vital signs: Reviewed and stable  Last Vitals:  Vitals Value Taken Time  BP 115/62 05/07/19 0928  Temp 36.4 C 05/07/19 0927  Pulse 66 05/07/19 0930  Resp 17 05/07/19 0930  SpO2 95 % 05/07/19 0930  Vitals shown include unvalidated device data.  Last Pain:  Vitals:   05/07/19 0927  TempSrc: Temporal  PainSc: Asleep         Complications: No apparent anesthesia complications

## 2019-05-07 NOTE — Anesthesia Postprocedure Evaluation (Signed)
Anesthesia Post Note  Patient: Nathaniel Thompson  Procedure(s) Performed: ESOPHAGOGASTRODUODENOSCOPY (EGD) WITH PROPOFOL (N/A ) COLONOSCOPY WITH PROPOFOL (N/A )  Patient location during evaluation: Endoscopy Anesthesia Type: General Level of consciousness: awake and alert Pain management: pain level controlled Vital Signs Assessment: post-procedure vital signs reviewed and stable Respiratory status: spontaneous breathing, nonlabored ventilation, respiratory function stable and patient connected to nasal cannula oxygen Cardiovascular status: blood pressure returned to baseline and stable Postop Assessment: no apparent nausea or vomiting Anesthetic complications: no     Last Vitals:  Vitals:   05/07/19 0947 05/07/19 0957  BP: 104/76 103/84  Pulse: 66 62  Resp: 13 18  Temp:    SpO2: 100% 100%    Last Pain:  Vitals:   05/07/19 0957  TempSrc:   PainSc: 0-No pain                 Precious Haws Aleatha Taite

## 2019-05-07 NOTE — Op Note (Signed)
[facility]astro[facility] Patient Name: Nathaniel Thompson Procedure Date: 05/07/2019 8:36 AM MRN: [id number] Account #: [id number] Date of Birth: September 29, 1935 Admit Type: Outpatient Age: 84 Room: [facility] ENDO ROOM 1 Gender: Male Note Status: Finalized Procedure:             Colonoscopy Indications:           Iron deficiency anemia Providers:             Robert Bellow, MD Medicines:             Monitored Anesthesia Care Complications:         No immediate complications. Procedure:             Pre-Anesthesia Assessment:                        - Prior to the procedure, a History and Physical was                         performed, and patient medications, allergies and                         sensitivities were reviewed. The patient's tolerance                         of previous anesthesia was reviewed.                        - The risks and benefits of the procedure and the                         sedation options and risks were discussed with the                         patient. All questions were answered and informed                         consent was obtained.                        After obtaining informed consent, the colonoscope was                         passed under direct vision. Throughout the procedure,                         the patient's blood pressure, pulse, and oxygen                         saturations were monitored continuously. The                         Colonoscope was introduced through the anus and                         advanced to the the cecum, identified by appendiceal                         orifice and ileocecal valve. The colonoscopy was  performed without difficulty. The patient tolerated                         the procedure well. The quality of the bowel                         preparation was excellent. Findings:      Multiple medium-mouthed diverticula were found in the sigmoid colon.       There was no  evidence of diverticular bleeding.      Localized mild inflammation characterized by erythema and friability was       found in the rectum.      External and internal hemorrhoids were found during endoscopy. The       hemorrhoids were medium-sized and Grade I (internal hemorrhoids that do       not prolapse). Impression:            - Moderate diverticulosis in the sigmoid colon. There                         was no evidence of diverticular bleeding.                        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                        - External and internal hemorrhoids.                        - No specimens collected. Recommendation:        - Discharge patient to home (via wheelchair). Procedure Code(s):     --- Professional ---                        [phone], Colonoscopy, flexible; diagnostic, including                         collection of specimen(s) by brushing or washing, when                         performed (separate procedure) Diagnosis Code(s):     --- Professional ---                        K64.0, First degree hemorrhoids                        K62.7, Radiation proctitis                        D50.9, Iron deficiency anemia, unspecified                        K57.30, Diverticulosis of large intestine without                         perforation or abscess without bleeding CPT copyright 2019 [employer]. All rights reserved. The codes documented in this report are preliminary and upon coder  review may  be revised to meet current compliance requirements. Robert Bellow, MD 05/07/2019 9:27:17 AM This report has been signed electronically. Number of Addenda: 0 Note Initiated On: 05/07/2019 8:36 AM Scope Withdrawal Time: 0 hours 9 minutes 3 seconds  Total Procedure Duration: 0 hours 20 minutes 12 seconds  Estimated Blood Loss:  Estimated blood loss: none.      [facility] [place]

## 2019-05-08 ENCOUNTER — Encounter: Payer: Self-pay | Admitting: *Deleted

## 2019-05-08 LAB — SURGICAL PATHOLOGY

## 2019-05-08 MED FILL — ABIRATERONE ACETATE 250 MG: 250 | 30 days supply | Qty: 30 | Fill #1

## 2019-05-16 ENCOUNTER — Other Ambulatory Visit: Payer: Medicare HMO

## 2019-05-19 ENCOUNTER — Other Ambulatory Visit: Payer: Self-pay

## 2019-05-20 ENCOUNTER — Inpatient Hospital Stay: Payer: Medicare HMO | Admitting: Internal Medicine

## 2019-05-20 ENCOUNTER — Inpatient Hospital Stay: Payer: Medicare HMO | Attending: Internal Medicine

## 2019-05-20 ENCOUNTER — Inpatient Hospital Stay: Payer: Medicare HMO

## 2019-05-20 DIAGNOSIS — E876 Hypokalemia: Secondary | ICD-10-CM | POA: Insufficient documentation

## 2019-05-20 DIAGNOSIS — C7951 Secondary malignant neoplasm of bone: Secondary | ICD-10-CM | POA: Diagnosis not present

## 2019-05-20 DIAGNOSIS — J449 Chronic obstructive pulmonary disease, unspecified: Secondary | ICD-10-CM | POA: Insufficient documentation

## 2019-05-20 DIAGNOSIS — C61 Malignant neoplasm of prostate: Secondary | ICD-10-CM | POA: Insufficient documentation

## 2019-05-20 DIAGNOSIS — D509 Iron deficiency anemia, unspecified: Secondary | ICD-10-CM | POA: Insufficient documentation

## 2019-05-20 DIAGNOSIS — Z8673 Personal history of transient ischemic attack (TIA), and cerebral infarction without residual deficits: Secondary | ICD-10-CM | POA: Insufficient documentation

## 2019-05-20 DIAGNOSIS — I2510 Atherosclerotic heart disease of native coronary artery without angina pectoris: Secondary | ICD-10-CM | POA: Diagnosis not present

## 2019-05-20 DIAGNOSIS — K2270 Barrett's esophagus without dysplasia: Secondary | ICD-10-CM | POA: Insufficient documentation

## 2019-05-20 DIAGNOSIS — N189 Chronic kidney disease, unspecified: Secondary | ICD-10-CM | POA: Insufficient documentation

## 2019-05-20 DIAGNOSIS — Z87891 Personal history of nicotine dependence: Secondary | ICD-10-CM | POA: Insufficient documentation

## 2019-05-20 DIAGNOSIS — I480 Paroxysmal atrial fibrillation: Secondary | ICD-10-CM | POA: Diagnosis not present

## 2019-05-20 LAB — CBC WITH DIFFERENTIAL/PLATELET
Abs Immature Granulocytes: 0.01 10*3/uL (ref 0.00–0.07)
Basophils Absolute: 0 10*3/uL (ref 0.0–0.1)
Basophils Relative: 1 %
Eosinophils Absolute: 0.2 10*3/uL (ref 0.0–0.5)
Eosinophils Relative: 5 %
HCT: 32.6 % — ABNORMAL LOW (ref 39.0–52.0)
Hemoglobin: 11.2 g/dL — ABNORMAL LOW (ref 13.0–17.0)
Immature Granulocytes: 0 %
Lymphocytes Relative: 27 %
Lymphs Abs: 1.1 10*3/uL (ref 0.7–4.0)
MCH: 28.8 pg (ref 26.0–34.0)
MCHC: 34.4 g/dL (ref 30.0–36.0)
MCV: 83.8 fL (ref 80.0–100.0)
Monocytes Absolute: 0.4 10*3/uL (ref 0.1–1.0)
Monocytes Relative: 9 %
Neutro Abs: 2.5 10*3/uL (ref 1.7–7.7)
Neutrophils Relative %: 58 %
Platelets: 206 10*3/uL (ref 150–400)
RBC: 3.89 MIL/uL — ABNORMAL LOW (ref 4.22–5.81)
RDW: 15 % (ref 11.5–15.5)
WBC: 4.2 10*3/uL (ref 4.0–10.5)
nRBC: 0 % (ref 0.0–0.2)

## 2019-05-20 LAB — COMPREHENSIVE METABOLIC PANEL
ALT: 16 U/L (ref 0–44)
AST: 24 U/L (ref 15–41)
Albumin: 3.8 g/dL (ref 3.5–5.0)
Alkaline Phosphatase: 51 U/L (ref 38–126)
Anion gap: 11 (ref 5–15)
BUN: 19 mg/dL (ref 8–23)
CO2: 26 mmol/L (ref 22–32)
Calcium: 9.2 mg/dL (ref 8.9–10.3)
Chloride: 102 mmol/L (ref 98–111)
Creatinine, Ser: 1.31 mg/dL — ABNORMAL HIGH (ref 0.61–1.24)
GFR calc Af Amer: 59 mL/min — ABNORMAL LOW (ref >60)
GFR calc non Af Amer: 51 mL/min — ABNORMAL LOW (ref >60)
Glucose, Bld: 142 mg/dL — ABNORMAL HIGH (ref 70–99)
Potassium: 2.7 mmol/L — CL (ref 3.5–5.1)
Sodium: 139 mmol/L (ref 135–145)
Total Bilirubin: 0.9 mg/dL (ref 0.3–1.2)
Total Protein: 6.5 g/dL (ref 6.5–8.1)

## 2019-05-20 LAB — PSA: Prostatic Specific Antigen: <0.01 ng/mL (ref 0.00–4.00)

## 2019-05-20 MED ORDER — POTASSIUM CHLORIDE CRYS ER 20 MEQ PO TBCR: 20.0000 meq | EXTENDED_RELEASE_TABLET | Freq: Every day | ORAL | 0 refills | Status: DC

## 2019-05-20 NOTE — Assessment & Plan Note (Addendum)
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 Bone scan October 2020-stable positive response. FEB 2021- PSA- <0.01.  Stable  # On Zytiga- low dose; recommend discontinuation of Zytiga-secondary to repeated severe hypokalemia/systolic blood pressures in 150s.  Discussed that we would recommend/discuss alternative options like Xtandi.   #Severe hypokalemia-2.7; sec to zytiga.  Continue Kudr 20 meq BID.  Zytiga discontinued.  Also recommend holding chlorthalidone-for 1 week.   #Iron deficient anemia -history of Barrett's esophagus-[s/p EGD March 2021]-stable Barrett's; colonoscopy-no etiology of bleeding noted.  Discussed capsule study-the next step; however given improving hemoglobin on oral iron I think it is reasonable to hold off capsule study at this time. UA-NEG feb 2021. Next US kidneys.   # A. fib paroxysmal-currently in sinus rhythm. on  Aspirin Plavix; I am concerned that severe electrolyte abnormality could lead to A. fib with RVR.  # DISPOSITION:   # HOLD Venofer today.  # follow up in last week April 2021; labs-cbc/cmp/PSA; eligard; possible venofer- Dr.B  Cc; Dr.Scott

## 2019-05-20 NOTE — Progress Notes (Signed)
[place] OFFICE PROGRESS NOTE  Patient Care Team: Einar Pheasant, MD as PCP - General (Internal Medicine) [name], MD as PCP - Cardiology (Cardiology) [name], MD as Consulting Physician (Cardiology)  Cancer Staging No matching staging information was found for the patient.   Oncology History Overview Note  Nov 2017- completed IM RT radiation therapy to his prostate and pelvic nodes for Gleason 7 (4+3) adenocarcinoma the prostate presenting the PSA of 7.8.  # JAN 2019- METASTATIC PROSTATE CA to Bone [low volume met on bone scan; CT- NED]; PSA ~50; March 25th 2019- Lupron 45 mg IM [Urology q 46M]; June 26, 2018-Eligard every 3 months[intolerance to Lupron/question A. Fib/injection site pain  # May 23rd Zytiga 25m/day; no prednisone; STOPPED MARCH 17th 2021-repeated severe hypokalemia.   # Barretts- EGD/ colo [[place]2021; Dr.Byrnett].   # OBenita Stabile [place]]; Oct 2019-paroxysmal A.fib/not on eliquis; [Dr.Khan] -Dr.Arida   ------------------------------------------------------------------  DIAGNOSIS: [ JAN 2019]- Met- PROSTATE CANCER  STAGE:  IV       ;GOALS: PALLIATIVE  CURRENT/MOST RECENT THERAPY [ march 2019]- Lupron; May 23rd 2019- Zytiga 2573mday    Malignant neoplasm of prostate ([facility]    INTERVAL HISTORY:  Nathaniel MCGILLIS89.o.  male pleasant patient above history of metastatic prostate cancer on ADT/Zytiga is here for follow-up.  Patient was restarted on Zytiga approximately 2 months ago after his potassium was noted to be normal.  Patient interim underwent EGD/colonoscopy for his iron deficiency anemia-Barrett's esophagus.   No recent hospitalizations.  Appetite is good with no weight loss no bone pain.  Complains of mild cramping in the legs.  Review of Systems  Constitutional: Negative for chills, diaphoresis, fever, malaise/fatigue and weight loss.  HENT: Negative for nosebleeds and sore throat.   Eyes: Negative  for double vision.  Respiratory: Negative for cough, hemoptysis, sputum production, shortness of breath and wheezing.   Cardiovascular: Negative for chest pain, palpitations, orthopnea and leg swelling.  Gastrointestinal: Negative for abdominal pain, blood in stool, constipation, diarrhea, heartburn, melena, nausea and vomiting.  Genitourinary: Negative for dysuria, frequency and urgency.  Musculoskeletal: Positive for myalgias. Negative for back pain and joint pain.  Skin: Negative.  Negative for itching and rash.  Neurological: Negative for tingling, focal weakness, weakness and headaches.  Endo/Heme/Allergies: Does not bruise/bleed easily.  Psychiatric/Behavioral: Negative for depression. The patient is not nervous/anxious and does not have insomnia.     PAST MEDICAL HISTORY :  Past Medical History:  Diagnosis Date  . Allergic rhinitis   . Barrett's esophagus 10/21/13  . Bone cancer ([facility]  . Chronic kidney disease    stones  . Colon polyp, hyperplastic   . COPD (chronic obstructive pulmonary disease) (HCC)    mild COPD. former smoker  . Coronary artery disease   . Diverticulosis   . Fundic gland polyps of stomach, benign   . Gastritis   . GERD (gastroesophageal reflux disease)   . Hypercholesteremia   . Hypertension   . Prostate cancer ([place]  . Prostatism   . Reflux esophagitis   . Renal stones   . Skin cancer    left cheek/lesion excised  . Sleep apnea   . TIA (transient ischemic attack)   . TIA (transient ischemic attack)     PAST SURGICAL HISTORY :   Past Surgical History:  Procedure Laterality Date  . CARDIAC CATHETERIZATION    . COLONOSCOPY WITH PROPOFOL N/A 09/13/2015   Procedure: COLONOSCOPY WITH PROPOFOL;  Surgeon:  Lollie Sails, MD;  Location[facility] ENDOSCOPY;  Service: Endoscopy;  Laterality: N/A;  . COLONOSCOPY WITH PROPOFOL N/A 05/07/2019   Procedure: COLONOSCOPY WITH PROPOFOL;  Surgeon: Robert Bellow, MD;  Location: [facility] ENDOSCOPY;  Service:  Endoscopy;  Laterality: N/A;  . CORONARY ANGIOPLASTY    . ESOPHAGOGASTRODUODENOSCOPY (EGD) WITH PROPOFOL N/A 09/13/2015   Procedure: ESOPHAGOGASTRODUODENOSCOPY (EGD) WITH PROPOFOL;  Surgeon: Lollie Sails, MD;  Location: [facility] ENDOSCOPY;  Service: Endoscopy;  Laterality: N/A;  . ESOPHAGOGASTRODUODENOSCOPY (EGD) WITH PROPOFOL N/A 12/28/2015   Procedure: ESOPHAGOGASTRODUODENOSCOPY (EGD) WITH PROPOFOL;  Surgeon: Lollie Sails, MD;  Location: [facility] ENDOSCOPY;  Service: Endoscopy;  Laterality: N/A;  . ESOPHAGOGASTRODUODENOSCOPY (EGD) WITH PROPOFOL N/A 06/16/2016   Procedure: ESOPHAGOGASTRODUODENOSCOPY (EGD) WITH PROPOFOL;  Surgeon: Lollie Sails, MD;  Location[facility] ENDOSCOPY;  Service: Endoscopy;  Laterality: N/A;  . ESOPHAGOGASTRODUODENOSCOPY (EGD) WITH PROPOFOL N/A 05/07/2019   Procedure: ESOPHAGOGASTRODUODENOSCOPY (EGD) WITH PROPOFOL;  Surgeon: Robert Bellow, MD;  Location: [facility] ENDOSCOPY;  Service: Endoscopy;  Laterality: N/A;  . EYE SURGERY  2013   CATARACT EXTRACTION  . GANGLION CYST EXCISION Right 05/18/2017   Procedure: REMOVAL GANGLION CYST ANKLE;  Surgeon: Samara Deist, DPM;  Location: [facility] ORS;  Service: Podiatry;  Laterality: Right;  . heart cath stent    . LEFT HEART CATH AND CORONARY ANGIOGRAPHY Right 12/03/2017   Procedure: Left Heart Cath and Coronary Angiography with possible coronary intervention;  Surgeon: Dionisio David, MD;  Location: [place] CV LAB;  Service: Cardiovascular;  Laterality: Right;  . PROSTATE BIOPSY    . stents     multiple    FAMILY HISTORY :   Family History  Problem Relation Age of Onset  . Cancer Mother        gastric and lung  . Cancer Father        multiple myeloma  . Stroke Father   . Cancer Sister        leukemia  . Cancer Brother        leukemia  . Cancer Brother        kidney  . Cancer Daughter        Uterine  . Cancer Other        [place] (Sister's Son): Prostate    SOCIAL HISTORY:   Social History   Tobacco Use   . Smoking status: Former Smoker    Packs/day: 1.00    Years: 35.00    Pack years: 35.00    Types: Cigarettes    Quit date: 03/06/1986    Years since quitting: 33.2  . Smokeless tobacco: Former Systems developer    Types: Chew  Substance Use Topics  . Alcohol use: No  . Drug use: No    ALLERGIES:  is allergic to atorvastatin.  MEDICATIONS:  Current Outpatient Medications  Medication Sig Dispense Refill  . abiraterone acetate (ZYTIGA) 250 MG tablet TAKE 1 TABLET (250 MG TOTAL) BY MOUTH DAILY. 30 tablet 4  . amLODipine (NORVASC) 2.5 MG tablet TAKE 1 TABLET BY MOUTH ONCE DAILY (Patient taking differently: 10 mg daily. ) 90 tablet 1  . aspirin EC 81 MG tablet Take 81 mg by mouth daily.    . calcium citrate-vitamin D (CITRACAL+D) 315-200 MG-UNIT tablet Take 1 tablet by mouth daily.    . chlorthalidone (HYGROTON) 25 MG tablet Take 25 mg by mouth daily.    . cholecalciferol (VITAMIN D) 1000 units tablet Take 1,000 Units by mouth daily.     . clopidogrel (PLAVIX) 75 MG tablet  Take 1 tablet (75 mg total) by mouth daily. 90 tablet 0  . ezetimibe (ZETIA) 10 MG tablet Take 10 mg by mouth daily.    . fexofenadine (ALLEGRA) 180 MG tablet Take 180 mg by mouth daily as needed for allergies or rhinitis.    . fluticasone (FLONASE) 50 MCG/ACT nasal spray Place 2 sprays into both nostrils daily.    . isosorbide mononitrate (IMDUR) 30 MG 24 hr tablet TAKE 1 TABLET BY MOUTH ONCE DAILY 90 tablet 2  . losartan (COZAAR) 50 MG tablet Take 1 tablet (50 mg total) by mouth daily. 90 tablet 1  . lovastatin (MEVACOR) 40 MG tablet TAKE 2 TABLETS BY MOUTH AT BEDTIME 180 tablet 1  . Multiple Vitamin (MULTI-VITAMINS) TABS Take 1 tablet by mouth daily.     . nitroGLYCERIN (NITROSTAT) 0.4 MG SL tablet Place 0.4 mg under the tongue every 5 (five) minutes as needed for chest pain.    . pantoprazole (PROTONIX) 40 MG tablet Take 40 mg by mouth 2 (two) times daily.     [name] POLYETHYLENE GLYCOL 3350 PO Take by mouth.    . potassium  chloride SA (KLOR-CON) 20 MEQ tablet Take 1 tablet (20 mEq total) by mouth daily. 30 tablet 0   No current facility-administered medications for this visit.    PHYSICAL EXAMINATION: ECOG PERFORMANCE STATUS: 0 - Asymptomatic  BP (!) 150/76 (BP Location: Right Arm, Patient Position: Sitting)   Pulse 79   Temp (!) 97.2 F (36.2 C) (Tympanic)   Wt 211 lb (95.7 kg)   SpO2 100%   BMI 30.28 kg/m   Filed Weights   05/20/19 1322  Weight: 211 lb (95.7 kg)    Physical Exam  Constitutional: He is oriented to person, place, and time and well-developed, well-nourished, and in no distress.  Patient is with his wife.  He is walking independently.  HENT:  Head: Normocephalic and atraumatic.  Mouth/Throat: Oropharynx is clear and moist. No oropharyngeal exudate.  Eyes: Pupils are equal, round, and reactive to light.  Cardiovascular: Normal rate and regular rhythm.  Pulmonary/Chest: No respiratory distress. He has no wheezes.  Abdominal: Soft. Bowel sounds are normal. He exhibits no distension and no mass. There is no abdominal tenderness. There is no rebound and no guarding.  Musculoskeletal:        General: No tenderness or edema. Normal range of motion.     Cervical back: Normal range of motion and neck supple.  Neurological: He is alert and oriented to person, place, and time.  Skin: Skin is warm.  Psychiatric: Affect normal.    LABORATORY DATA:  I have reviewed the data as listed    Component Value Date/Time   NA 139 05/20/2019 1303   NA 140 09/16/2013 0438   K 2.7 (LL) 05/20/2019 1303   K 4.2 09/16/2013 0438   CL 102 05/20/2019 1303   CL 109 (H) 09/16/2013 0438   CO2 26 05/20/2019 1303   CO2 24 09/16/2013 0438   GLUCOSE 142 (H) 05/20/2019 1303   GLUCOSE 101 (H) 09/16/2013 0438   BUN 19 05/20/2019 1303   BUN 20 (H) 09/16/2013 0438   CREATININE 1.31 (H) 05/20/2019 1303   CREATININE 1.28 09/16/2013 0438   CALCIUM 9.2 05/20/2019 1303   CALCIUM 8.3 (L) 09/16/2013 0438   PROT  6.5 05/20/2019 1303   PROT 6.7 09/15/2013 0755   ALBUMIN 3.8 05/20/2019 1303   ALBUMIN 3.5 09/15/2013 0755   AST 24 05/20/2019 1303   AST 37 09/15/2013 0755  ALT 16 05/20/2019 1303   ALT 28 09/15/2013 0755   ALKPHOS 51 05/20/2019 1303   ALKPHOS 44 (L) 09/15/2013 0755   BILITOT 0.9 05/20/2019 1303   BILITOT 0.5 09/15/2013 0755   GFRNONAA 51 (L) 05/20/2019 1303   GFRNONAA 54 (L) 09/16/2013 0438   GFRAA 59 (L) 05/20/2019 1303   GFRAA >60 09/16/2013 0438    No results found for: SPEP, UPEP  Lab Results  Component Value Date   WBC 4.2 05/20/2019   NEUTROABS 2.5 05/20/2019   HGB 11.2 (L) 05/20/2019   HCT 32.6 (L) 05/20/2019   MCV 83.8 05/20/2019   PLT 206 05/20/2019      Chemistry      Component Value Date/Time   NA 139 05/20/2019 1303   NA 140 09/16/2013 0438   K 2.7 (LL) 05/20/2019 1303   K 4.2 09/16/2013 0438   CL 102 05/20/2019 1303   CL 109 (H) 09/16/2013 0438   CO2 26 05/20/2019 1303   CO2 24 09/16/2013 0438   BUN 19 05/20/2019 1303   BUN 20 (H) 09/16/2013 0438   CREATININE 1.31 (H) 05/20/2019 1303   CREATININE 1.28 09/16/2013 0438      Component Value Date/Time   CALCIUM 9.2 05/20/2019 1303   CALCIUM 8.3 (L) 09/16/2013 0438   ALKPHOS 51 05/20/2019 1303   ALKPHOS 44 (L) 09/15/2013 0755   AST 24 05/20/2019 1303   AST 37 09/15/2013 0755   ALT 16 05/20/2019 1303   ALT 28 09/15/2013 0755   BILITOT 0.9 05/20/2019 1303   BILITOT 0.5 09/15/2013 0755       RADIOGRAPHIC STUDIES: I have personally reviewed the radiological images as listed and agreed with the findings in the report. No results found.   ASSESSMENT & PLAN:  Malignant neoplasm of prostate ([place]) # Castrate sensitive-metastatic prostate cancer to the bone. Stage IV-on ADT/Zytiga low-dose. Bone scan October 2020-stable positive response. FEB 2021- PSA- <0.01.  Stable  # On Zytiga- low dose; recommend discontinuation of Zytiga-secondary to repeated severe hypokalemia/systolic blood pressures in  150s.  Discussed that we would recommend/discuss alternative options like Xtandi.   #Severe hypokalemia-2.7; sec to zytiga.  Continue Kudr 20 meq BID.  Zytiga discontinued.  Also recommend holding chlorthalidone-for 1 week.   #Iron deficient anemia -history of Barrett's esophagus-[s/p EGD March 2021]-stable Barrett's; colonoscopy-no etiology of bleeding noted.  Discussed capsule study-the next step; however given improving hemoglobin on oral iron I think it is reasonable to hold off capsule study at this time. UA-NEG feb 2021. Next US kidneys.   # A. fib paroxysmal-currently in sinus rhythm. on  Aspirin Plavix; I am concerned that severe electrolyte abnormality could lead to A. fib with RVR.  # DISPOSITION:   # HOLD Venofer today.  # follow up in last week April 2021; labs-cbc/cmp/PSA; eligard; possible venofer- Dr.B  Cc; Dr.Scott    Orders Placed This Encounter  Procedures  . CBC with Differential    Standing Status:   Future    Standing Expiration Date:   05/19/2020  . Comprehensive metabolic panel    Standing Status:   Future    Standing Expiration Date:   05/19/2020  . PSA    Standing Status:   Future    Standing Expiration Date:   05/19/2020   All questions were answered. The patient knows to call the clinic with any problems, questions or concerns.      Cammie Sickle, MD 05/21/2019 7:19 AM

## 2019-05-20 NOTE — Progress Notes (Signed)
1333-Critical Potassium 2.7 called by Marc Morgans, lab tech.  Read back process performed with lab tech/Dr. Rogue Bussing

## 2019-05-20 NOTE — Patient Instructions (Signed)
#   STOP ZYTIGA  # HOLD CHLORTHALIDONE X1 WEEK; AND THEN CAN -RE-START  # TAKE POTASSIUM PILLS AS RECOMMENDED

## 2019-05-26 ENCOUNTER — Ambulatory Visit (INDEPENDENT_AMBULATORY_CARE_PROVIDER_SITE_OTHER): Payer: Medicare HMO

## 2019-05-26 ENCOUNTER — Other Ambulatory Visit: Payer: Self-pay

## 2019-05-26 DIAGNOSIS — R0602 Shortness of breath: Secondary | ICD-10-CM

## 2019-05-29 ENCOUNTER — Other Ambulatory Visit: Payer: Self-pay

## 2019-05-29 ENCOUNTER — Ambulatory Visit (INDEPENDENT_AMBULATORY_CARE_PROVIDER_SITE_OTHER): Payer: Medicare HMO | Admitting: *Deleted

## 2019-05-29 DIAGNOSIS — E538 Deficiency of other specified B group vitamins: Secondary | ICD-10-CM | POA: Diagnosis not present

## 2019-05-30 MED ORDER — CYANOCOBALAMIN 1000 MCG/ML IJ SOLN
1000.0000 ug | Freq: Once | INTRAMUSCULAR | Status: AC
  Administered 2019-05-29: 1000 ug via INTRAMUSCULAR

## 2019-05-30 NOTE — Progress Notes (Addendum)
Patient presented for B 12 injection to left deltoid, patient voiced no concerns nor showed any signs of distress during injection.  Reviewed.  Dr Scott 

## 2019-06-10 ENCOUNTER — Other Ambulatory Visit: Payer: Medicare HMO

## 2019-06-20 ENCOUNTER — Other Ambulatory Visit: Payer: Self-pay | Admitting: Internal Medicine

## 2019-06-27 ENCOUNTER — Other Ambulatory Visit: Payer: Self-pay | Admitting: Internal Medicine

## 2019-07-01 ENCOUNTER — Ambulatory Visit (INDEPENDENT_AMBULATORY_CARE_PROVIDER_SITE_OTHER): Payer: Medicare HMO | Admitting: *Deleted

## 2019-07-01 ENCOUNTER — Inpatient Hospital Stay: Payer: Medicare HMO

## 2019-07-01 ENCOUNTER — Other Ambulatory Visit: Payer: Self-pay

## 2019-07-01 ENCOUNTER — Inpatient Hospital Stay: Payer: Medicare HMO | Attending: Internal Medicine

## 2019-07-01 ENCOUNTER — Encounter: Payer: Self-pay | Admitting: Internal Medicine

## 2019-07-01 ENCOUNTER — Inpatient Hospital Stay: Payer: Medicare HMO | Admitting: Internal Medicine

## 2019-07-01 DIAGNOSIS — Z8673 Personal history of transient ischemic attack (TIA), and cerebral infarction without residual deficits: Secondary | ICD-10-CM | POA: Insufficient documentation

## 2019-07-01 DIAGNOSIS — E538 Deficiency of other specified B group vitamins: Secondary | ICD-10-CM

## 2019-07-01 DIAGNOSIS — J449 Chronic obstructive pulmonary disease, unspecified: Secondary | ICD-10-CM | POA: Diagnosis not present

## 2019-07-01 DIAGNOSIS — E876 Hypokalemia: Secondary | ICD-10-CM | POA: Insufficient documentation

## 2019-07-01 DIAGNOSIS — I480 Paroxysmal atrial fibrillation: Secondary | ICD-10-CM | POA: Insufficient documentation

## 2019-07-01 DIAGNOSIS — C7951 Secondary malignant neoplasm of bone: Secondary | ICD-10-CM | POA: Insufficient documentation

## 2019-07-01 DIAGNOSIS — I129 Hypertensive chronic kidney disease with stage 1 through stage 4 chronic kidney disease, or unspecified chronic kidney disease: Secondary | ICD-10-CM | POA: Insufficient documentation

## 2019-07-01 DIAGNOSIS — N189 Chronic kidney disease, unspecified: Secondary | ICD-10-CM | POA: Diagnosis not present

## 2019-07-01 DIAGNOSIS — Z85828 Personal history of other malignant neoplasm of skin: Secondary | ICD-10-CM | POA: Diagnosis not present

## 2019-07-01 DIAGNOSIS — C61 Malignant neoplasm of prostate: Secondary | ICD-10-CM

## 2019-07-01 DIAGNOSIS — Z87891 Personal history of nicotine dependence: Secondary | ICD-10-CM | POA: Diagnosis not present

## 2019-07-01 DIAGNOSIS — Z79899 Other long term (current) drug therapy: Secondary | ICD-10-CM | POA: Insufficient documentation

## 2019-07-01 LAB — CBC WITH DIFFERENTIAL/PLATELET
Abs Immature Granulocytes: 0.01 10*3/uL (ref 0.00–0.07)
Basophils Absolute: 0 10*3/uL (ref 0.0–0.1)
Basophils Relative: 1 %
Eosinophils Absolute: 0.4 10*3/uL (ref 0.0–0.5)
Eosinophils Relative: 6 %
HCT: 34.5 % — ABNORMAL LOW (ref 39.0–52.0)
Hemoglobin: 12 g/dL — ABNORMAL LOW (ref 13.0–17.0)
Immature Granulocytes: 0 %
Lymphocytes Relative: 26 %
Lymphs Abs: 1.5 10*3/uL (ref 0.7–4.0)
MCH: 29.2 pg (ref 26.0–34.0)
MCHC: 34.8 g/dL (ref 30.0–36.0)
MCV: 83.9 fL (ref 80.0–100.0)
Monocytes Absolute: 0.7 10*3/uL (ref 0.1–1.0)
Monocytes Relative: 12 %
Neutro Abs: 3.3 10*3/uL (ref 1.7–7.7)
Neutrophils Relative %: 55 %
Platelets: 208 10*3/uL (ref 150–400)
RBC: 4.11 MIL/uL — ABNORMAL LOW (ref 4.22–5.81)
RDW: 14.2 % (ref 11.5–15.5)
WBC: 5.9 10*3/uL (ref 4.0–10.5)
nRBC: 0 % (ref 0.0–0.2)

## 2019-07-01 LAB — COMPREHENSIVE METABOLIC PANEL
ALT: 18 U/L (ref 0–44)
AST: 24 U/L (ref 15–41)
Albumin: 3.9 g/dL (ref 3.5–5.0)
Alkaline Phosphatase: 46 U/L (ref 38–126)
Anion gap: 10 (ref 5–15)
BUN: 24 mg/dL — ABNORMAL HIGH (ref 8–23)
CO2: 26 mmol/L (ref 22–32)
Calcium: 9 mg/dL (ref 8.9–10.3)
Chloride: 101 mmol/L (ref 98–111)
Creatinine, Ser: 1.32 mg/dL — ABNORMAL HIGH (ref 0.61–1.24)
GFR calc Af Amer: 58 mL/min — ABNORMAL LOW (ref >60)
GFR calc non Af Amer: 50 mL/min — ABNORMAL LOW (ref >60)
Glucose, Bld: 102 mg/dL — ABNORMAL HIGH (ref 70–99)
Potassium: 3.3 mmol/L — ABNORMAL LOW (ref 3.5–5.1)
Sodium: 137 mmol/L (ref 135–145)
Total Bilirubin: 0.7 mg/dL (ref 0.3–1.2)
Total Protein: 7 g/dL (ref 6.5–8.1)

## 2019-07-01 LAB — PSA: Prostatic Specific Antigen: <0.01 ng/mL (ref 0.00–4.00)

## 2019-07-01 NOTE — Assessment & Plan Note (Addendum)
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 Bone scan October 2020-stable positive response. April 27th, 2021- PSA- <0.01.  STABLE.   #Given the poor tolerance to Zytiga; discontinue Zytiga.  Continue surveillance given the excellent/well-controlled prostate cancer.   #Severe hypokalemia-2.7; sec to zytiga.  Continue Kudr 20 meq BID.  Zytiga discontinued.  Also recommend holding chlorthalidone-for 1 week.  Defer to cardiology/PCP for further adjustment of antihypertensives.  #Iron deficient anemia -history of Barrett's esophagus-[s/p EGD March 2021]-stable Barrett's; colonoscopy-no etiology of bleeding noted.  # A. fib paroxysmal-currently in sinus rhythm. on  Aspirin Plavix-   # DISPOSITION:   # HOLD Eligard today; HOLD venofer.  # Eligard on May 7th AM- pt pref # in AUG first week 2021- labs-cbc/cmp/PSA; eligard; possible venofer- Dr.B  Cc; Dr.Scott/Dr.Arida.

## 2019-07-01 NOTE — Progress Notes (Signed)
[facility] OFFICE PROGRESS NOTE  Patient Care Team: Einar Pheasant, MD as PCP - General (Internal Medicine) [name], MD as PCP - Cardiology (Cardiology) [name], MD as Consulting Physician (Cardiology)  Cancer Staging No matching staging information was found for the patient.   Oncology History Overview Note  Nov 2017- completed IM RT radiation therapy to his prostate and pelvic nodes for Gleason 7 (4+3) adenocarcinoma the prostate presenting the PSA of 7.8.  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 Fib/injection site pain  # May 23rd Zytiga 287m/day; no prednisone; STOPPED MARCH 17th 2021-repeated severe hypokalemia;   # Barretts- EGD/ colo [[place]2021; Dr.Byrnett].   # OBenita Stabile [place]]; Oct 2019-paroxysmal A.fib/not on eliquis; [Dr.Khan] -Dr.Arida   ------------------------------------------------------------------  DIAGNOSIS: [ JAN 2019]- Met- PROSTATE CANCER  STAGE:  IV       ;GOALS: PALLIATIVE  CURRENT/MOST RECENT THERAPY [ march 2019]- Lupron; May 23rd 2019- Zytiga 2543mday; discontinued-March 2021.     [facility]    INTERVAL HISTORY:  JaJEARL [id number].  male pleasant patient above history of metastatic prostate cancer on ADT/Zytiga is here for follow-up.  Patient states that he is currently on hold because of severe hypokalemia/extreme fatigue.  Denies any nausea vomiting abdominal pain.  Denies any current swelling in the legs.  Appetite is good.  No weight loss.  No worsening bone pain.  Review of Systems  Constitutional: Negative for chills, diaphoresis, fever, malaise/fatigue and weight loss.  HENT: Negative for nosebleeds and sore throat.   Eyes: Negative for double vision.  Respiratory: Negative for cough,  hemoptysis, sputum production, shortness of breath and wheezing.   Cardiovascular: Negative for chest pain, palpitations, orthopnea and leg swelling.  Gastrointestinal: Negative for abdominal pain, blood in stool, constipation, diarrhea, heartburn, melena, nausea and vomiting.  Genitourinary: Negative for dysuria, frequency and urgency.  Musculoskeletal: Positive for myalgias. Negative for back pain and joint pain.  Skin: Negative.  Negative for itching and rash.  Neurological: Negative for tingling, focal weakness, weakness and headaches.  Endo/Heme/Allergies: Does not bruise/bleed easily.  Psychiatric/Behavioral: Negative for depression. The patient is not nervous/anxious and does not have insomnia.     PAST MEDICAL HISTORY :  Past Medical History:  Diagnosis Date  . Allergic rhinitis   . Barrett's esophagus 10/21/13  . Bone cancer ([facility]  . Chronic kidney disease    stones  . Colon polyp, hyperplastic   . COPD (chronic obstructive pulmonary disease) (HCC)    mild COPD. former smoker  . Coronary artery disease   . Diverticulosis   . Fundic gland polyps of stomach, benign   . Gastritis   . GERD (gastroesophageal reflux disease)   . Hypercholesteremia   . Hypertension   . Prostate cancer ([facility]  . Prostatism   . Reflux esophagitis   . Renal stones   . Skin cancer    left cheek/lesion excised  . Sleep apnea   . TIA (transient ischemic attack)   . TIA (transient ischemic attack)     PAST SURGICAL HISTORY :   Past Surgical History:  Procedure Laterality Date  . CARDIAC CATHETERIZATION    . COLONOSCOPY WITH PROPOFOL N/A 09/13/2015   Procedure: COLONOSCOPY WITH PROPOFOL;  Surgeon: MaLollie SailsMD;  [facility];  Service: Endoscopy;  Laterality: N/A;  . COLONOSCOPY WITH PROPOFOL N/A 05/07/2019   Procedure: COLONOSCOPY WITH PROPOFOL;  Surgeon: Robert Bellow, MD;  Location: [facility] ENDOSCOPY;  Service: Endoscopy;  Laterality: N/A;  . CORONARY ANGIOPLASTY    .  ESOPHAGOGASTRODUODENOSCOPY (EGD) WITH PROPOFOL N/A 09/13/2015   Procedure: ESOPHAGOGASTRODUODENOSCOPY (EGD) WITH PROPOFOL;  Surgeon: Lollie Sails, MD;  Location: [facility] ENDOSCOPY;  Service: Endoscopy;  Laterality: N/A;  . ESOPHAGOGASTRODUODENOSCOPY (EGD) WITH PROPOFOL N/A 12/28/2015   Procedure: ESOPHAGOGASTRODUODENOSCOPY (EGD) WITH PROPOFOL;  Surgeon: Lollie Sails, MD;  Location: [facility] ENDOSCOPY;  Service: Endoscopy;  Laterality: N/A;  . ESOPHAGOGASTRODUODENOSCOPY (EGD) WITH PROPOFOL N/A 06/16/2016   Procedure: ESOPHAGOGASTRODUODENOSCOPY (EGD) WITH PROPOFOL;  Surgeon: Lollie Sails, MD;  [facility] END[facility]COPY;  Service: Endoscopy;  Laterality: N/A;  . ESOPHAGOGASTRODUODENOSCOPY (EGD) WITH PROPOFOL N/A 05/07/2019   Procedure: ESOPHAGOGASTRODUODENOSCOPY (EGD) WITH PROPOFOL;  Surgeon: Robert Bellow, MD;  Location: [facility] ENDOSCOPY;  Service: Endoscopy;  Laterality: N/A;  . EYE SURGERY  2013   CATARACT EXTRACTION  . GANGLION CYST EXCISION Right 05/18/2017   Procedure: REMOVAL GANGLION CYST ANKLE;  Surgeon: Samara Deist, DPM;  Location: [facility] ORS;  Service: Podiatry;  Laterality: Right;  . heart cath stent    . LEFT HEART CATH AND CORONARY ANGIOGRAPHY Right 12/03/2017   Procedure: Left Heart Cath and Coronary Angiography with possible coronary intervention;  Surgeon: Dionisio David, MD;  Location: [place] CV LAB;  Service: Cardiovascular;  Laterality: Right;  . PROSTATE BIOPSY    . stents     multiple    FAMILY HISTORY :   Family History  Problem Relation Age of Onset  . Cancer Mother        gastric and lung  . Cancer Father        multiple myeloma  . Stroke Father   . Cancer Sister        leukemia  . Cancer Brother        leukemia  . Cancer Brother        kidney  . Cancer Daughter        Uterine  . Cancer Other        [place] (Sister's Son): Prostate    SOCIAL HISTORY:   Social History   Tobacco Use  . Smoking status: Former Smoker    Packs/day: 1.00     Years: 35.00    Pack years: 35.00    Types: Cigarettes    Quit date: 03/06/1986    Years since quitting: 33.3  . Smokeless tobacco: Former Systems developer    Types: Chew  Substance Use Topics  . Alcohol use: No  . Drug use: No    ALLERGIES:  is allergic to atorvastatin.  MEDICATIONS:  Current Outpatient Medications  Medication Sig Dispense Refill  . amLODipine (NORVASC) 2.5 MG tablet TAKE 1 TABLET BY MOUTH ONCE DAILY (Patient taking differently: 10 mg daily. ) 90 tablet 1  . aspirin EC 81 MG tablet Take 81 mg by mouth daily.    . calcium citrate-vitamin D (CITRACAL+D) 315-200 MG-UNIT tablet Take 1 tablet by mouth daily.    . chlorthalidone (HYGROTON) 25 MG tablet Take 25 mg by mouth daily.    . cholecalciferol (VITAMIN D) 1000 units tablet Take 1,000 Units by mouth daily.     . clopidogrel (PLAVIX) 75 MG tablet Take 1 tablet (75 mg total) by mouth daily. 90 tablet 0  . ezetimibe (ZETIA) 10 MG tablet Take 10 mg by mouth daily.    [name]  fexofenadine (ALLEGRA) 180 MG tablet Take 180 mg by mouth daily as needed for allergies or rhinitis.    . fluticasone (FLONASE) 50 MCG/ACT nasal spray Place 2 sprays into both nostrils daily.    . isosorbide mononitrate (IMDUR) 30 MG 24 hr tablet TAKE 1 TABLET BY MOUTH ONCE DAILY 90 tablet 2  . losartan (COZAAR) 50 MG tablet Take 1 tablet (50 mg total) by mouth daily. 90 tablet 1  . lovastatin (MEVACOR) 40 MG tablet TAKE 2 TABLETS BY MOUTH AT BEDTIME 180 tablet 1  . Multiple Vitamin (MULTI-VITAMINS) TABS Take 1 tablet by mouth daily.     . pantoprazole (PROTONIX) 40 MG tablet Take 40 mg by mouth 2 (two) times daily.     [name] POLYETHYLENE GLYCOL 3350 PO Take by mouth.    . potassium chloride SA (KLOR-CON) 20 MEQ tablet TAKE 1 TABLET BY MOUTH ONCE DAILY 30 tablet 0  . nitroGLYCERIN (NITROSTAT) 0.4 MG SL tablet Place 0.4 mg under the tongue every 5 (five) minutes as needed for chest pain.     No current facility-administered medications for this visit.    PHYSICAL  EXAMINATION: ECOG PERFORMANCE STATUS: 0 - Asymptomatic  BP 124/69   Pulse 70   Temp (!) 95.3 F (35.2 C) (Tympanic)   Resp 20   There were no vitals filed for this visit.  Physical Exam  Constitutional: He is oriented to person, place, and time and well-developed, well-nourished, and in no distress.  Patient is with his wife.  He is walking independently.  HENT:  Head: Normocephalic and atraumatic.  Mouth/Throat: Oropharynx is clear and moist. No oropharyngeal exudate.  Eyes: Pupils are equal, round, and reactive to light.  Cardiovascular: Normal rate and regular rhythm.  Pulmonary/Chest: No respiratory distress. He has no wheezes.  Abdominal: Soft. Bowel sounds are normal. He exhibits no distension and no mass. There is no abdominal tenderness. There is no rebound and no guarding.  Musculoskeletal:        General: No tenderness or edema. Normal range of motion.     Cervical back: Normal range of motion and neck supple.  Neurological: He is alert and oriented to person, place, and time.  Skin: Skin is warm.  Psychiatric: Affect normal.    LABORATORY DATA:  I have reviewed the data as listed    Component Value Date/Time   NA 137 07/01/2019 1242   NA 140 09/16/2013 0438   K 3.3 (L) 07/01/2019 1242   K 4.2 09/16/2013 0438   CL 101 07/01/2019 1242   CL 109 (H) 09/16/2013 0438   CO2 26 07/01/2019 1242   CO2 24 09/16/2013 0438   GLUCOSE 102 (H) 07/01/2019 1242   GLUCOSE 101 (H) 09/16/2013 0438   BUN 24 (H) 07/01/2019 1242   BUN 20 (H) 09/16/2013 0438   CREATININE 1.32 (H) 07/01/2019 1242   CREATININE 1.28 09/16/2013 0438   CALCIUM 9.0 07/01/2019 1242   CALCIUM 8.3 (L) 09/16/2013 0438   PROT 7.0 07/01/2019 1242   PROT 6.7 09/15/2013 0755   ALBUMIN 3.9 07/01/2019 1242   ALBUMIN 3.5 09/15/2013 0755   AST 24 07/01/2019 1242   AST 37 09/15/2013 0755   ALT 18 07/01/2019 1242   ALT 28 09/15/2013 0755   ALKPHOS 46 07/01/2019 1242   ALKPHOS 44 (L) 09/15/2013 0755   BILITOT  0.7 07/01/2019 1242   BILITOT 0.5 09/15/2013 0755   GFRNONAA 50 (L) 07/01/2019 1242   GFRNONAA 54 (L) 09/16/2013 0438   GFRAA 58 (L)  07/01/2019 1242   GFRAA >60 09/16/2013 0438    No results found for: SPEP, UPEP  Lab Results  Component Value Date   WBC 5.9 07/01/2019   NEUTROABS 3.3 07/01/2019   HGB 12.0 (L) 07/01/2019   HCT 34.5 (L) 07/01/2019   MCV 83.9 07/01/2019   PLT 208 07/01/2019      Chemistry      Component Value Date/Time   NA 137 07/01/2019 1242   NA 140 09/16/2013 0438   K 3.3 (L) 07/01/2019 1242   K 4.2 09/16/2013 0438   CL 101 07/01/2019 1242   CL 109 (H) 09/16/2013 0438   CO2 26 07/01/2019 1242   CO2 24 09/16/2013 0438   BUN 24 (H) 07/01/2019 1242   BUN 20 (H) 09/16/2013 0438   CREATININE 1.32 (H) 07/01/2019 1242   CREATININE 1.28 09/16/2013 0438      Component Value Date/Time   CALCIUM 9.0 07/01/2019 1242   CALCIUM 8.3 (L) 09/16/2013 0438   ALKPHOS 46 07/01/2019 1242   ALKPHOS 44 (L) 09/15/2013 0755   AST 24 07/01/2019 1242   AST 37 09/15/2013 0755   ALT 18 07/01/2019 1242   ALT 28 09/15/2013 0755   BILITOT 0.7 07/01/2019 1242   BILITOT 0.5 09/15/2013 0755       RADIOGRAPHIC STUDIES: I have personally reviewed the radiological images as listed and agreed with the findings in the report. No results found.   ASSESSMENT & PLAN:  Malignant neoplasm of prostate ([place]) # Castrate sensitive-metastatic prostate cancer to the bone. Stage IV-on ADT/Zytiga low-dose. Bone scan October 2020-stable positive response. April 27th, 2021- PSA- <0.01.  STABLE.   #Given the poor tolerance to Zytiga; discontinue Zytiga.  Continue surveillance given the excellent/well-controlled prostate cancer.   #Severe hypokalemia-2.7; sec to zytiga.  Continue Kudr 20 meq BID.  Zytiga discontinued.  Also recommend holding chlorthalidone-for 1 week.  Defer to cardiology/PCP for further adjustment of antihypertensives.  #Iron deficient anemia -history of Barrett's  esophagus-[s/p EGD March 2021]-stable Barrett's; colonoscopy-no etiology of bleeding noted.  # A. fib paroxysmal-currently in sinus rhythm. on  Aspirin Plavix-   # DISPOSITION:   # HOLD Eligard today; HOLD venofer.  # Eligard on May 7th AM- pt pref # in AUG first week 2021- labs-cbc/cmp/PSA; eligard; possible venofer- Dr.B  Cc; Dr.Scott/Dr.Arida.     Orders Placed This Encounter  Procedures  . CBC with Differential    Standing Status:   Future    Standing Expiration Date:   06/30/2020  . Comprehensive metabolic panel    Standing Status:   Future    Standing Expiration Date:   06/30/2020  . PSA    Standing Status:   Future    Standing Expiration Date:   06/30/2020   All questions were answered. The patient knows to call the clinic with any problems, questions or concerns.      Cammie Sickle, MD 07/07/2019 11:40 PM

## 2019-07-03 MED ORDER — CYANOCOBALAMIN 1000 MCG/ML IJ SOLN
1000.0000 ug | Freq: Once | INTRAMUSCULAR | Status: AC
  Administered 2019-07-01: 15:00:00 1000 ug via INTRAMUSCULAR

## 2019-07-03 NOTE — Progress Notes (Addendum)
Patient presented for B 12 injection to left deltoid, patient voiced no concerns nor showed any signs of distress during injection.  Reviewed.  Dr Scott 

## 2019-07-11 ENCOUNTER — Other Ambulatory Visit: Payer: Self-pay

## 2019-07-11 ENCOUNTER — Inpatient Hospital Stay: Payer: Medicare HMO | Attending: Internal Medicine

## 2019-07-11 DIAGNOSIS — C775 Secondary and unspecified malignant neoplasm of intrapelvic lymph nodes: Secondary | ICD-10-CM | POA: Diagnosis not present

## 2019-07-11 DIAGNOSIS — C7951 Secondary malignant neoplasm of bone: Secondary | ICD-10-CM | POA: Diagnosis not present

## 2019-07-11 DIAGNOSIS — Z5111 Encounter for antineoplastic chemotherapy: Secondary | ICD-10-CM | POA: Insufficient documentation

## 2019-07-11 DIAGNOSIS — C61 Malignant neoplasm of prostate: Secondary | ICD-10-CM | POA: Diagnosis not present

## 2019-07-11 MED ORDER — LEUPROLIDE ACETATE (3 MONTH) 22.5 MG ~~LOC~~ KIT
22.5000 mg | PACK | Freq: Once | SUBCUTANEOUS | Status: AC
  Administered 2019-07-11: 22.5 mg via SUBCUTANEOUS
  Filled 2019-07-11: qty 22.5

## 2019-07-15 ENCOUNTER — Other Ambulatory Visit: Payer: Self-pay | Admitting: Internal Medicine

## 2019-07-15 NOTE — Telephone Encounter (Signed)
   Ref Range & Units 2 wk ago  Potassium 3.5 - 5.1 mmol/L 3.3Low

## 2019-07-16 ENCOUNTER — Ambulatory Visit (INDEPENDENT_AMBULATORY_CARE_PROVIDER_SITE_OTHER): Payer: Medicare HMO

## 2019-07-16 ENCOUNTER — Encounter: Payer: Self-pay | Admitting: Internal Medicine

## 2019-07-16 ENCOUNTER — Other Ambulatory Visit: Payer: Self-pay

## 2019-07-16 ENCOUNTER — Telehealth: Payer: Medicare HMO

## 2019-07-16 ENCOUNTER — Ambulatory Visit: Payer: Medicare HMO | Admitting: Internal Medicine

## 2019-07-16 ENCOUNTER — Telehealth: Payer: Self-pay | Admitting: Internal Medicine

## 2019-07-16 VITALS — Ht 70.0 in | Wt 211.0 lb

## 2019-07-16 VITALS — Ht 70.0 in | Wt 213.0 lb

## 2019-07-16 DIAGNOSIS — I714 Abdominal aortic aneurysm, without rupture, unspecified: Secondary | ICD-10-CM

## 2019-07-16 DIAGNOSIS — G4730 Sleep apnea, unspecified: Secondary | ICD-10-CM

## 2019-07-16 DIAGNOSIS — I10 Essential (primary) hypertension: Secondary | ICD-10-CM

## 2019-07-16 DIAGNOSIS — D500 Iron deficiency anemia secondary to blood loss (chronic): Secondary | ICD-10-CM

## 2019-07-16 DIAGNOSIS — I779 Disorder of arteries and arterioles, unspecified: Secondary | ICD-10-CM

## 2019-07-16 DIAGNOSIS — I724 Aneurysm of artery of lower extremity: Secondary | ICD-10-CM

## 2019-07-16 DIAGNOSIS — N1831 Chronic kidney disease, stage 3a: Secondary | ICD-10-CM

## 2019-07-16 DIAGNOSIS — E7800 Pure hypercholesterolemia, unspecified: Secondary | ICD-10-CM

## 2019-07-16 DIAGNOSIS — R0981 Nasal congestion: Secondary | ICD-10-CM | POA: Diagnosis not present

## 2019-07-16 DIAGNOSIS — R739 Hyperglycemia, unspecified: Secondary | ICD-10-CM

## 2019-07-16 DIAGNOSIS — J449 Chronic obstructive pulmonary disease, unspecified: Secondary | ICD-10-CM

## 2019-07-16 DIAGNOSIS — K219 Gastro-esophageal reflux disease without esophagitis: Secondary | ICD-10-CM

## 2019-07-16 DIAGNOSIS — R42 Dizziness and giddiness: Secondary | ICD-10-CM

## 2019-07-16 DIAGNOSIS — Z0000 Encounter for general adult medical examination without abnormal findings: Secondary | ICD-10-CM | POA: Diagnosis not present

## 2019-07-16 DIAGNOSIS — E538 Deficiency of other specified B group vitamins: Secondary | ICD-10-CM

## 2019-07-16 DIAGNOSIS — M79673 Pain in unspecified foot: Secondary | ICD-10-CM

## 2019-07-16 DIAGNOSIS — I4891 Unspecified atrial fibrillation: Secondary | ICD-10-CM

## 2019-07-16 DIAGNOSIS — C61 Malignant neoplasm of prostate: Secondary | ICD-10-CM

## 2019-07-16 MED ORDER — AMLODIPINE BESYLATE 10 MG PO TABS: 10.0000 mg | ORAL_TABLET | Freq: Every day | ORAL | 1 refills | Status: DC

## 2019-07-16 MED ORDER — EZETIMIBE 10 MG PO TABS: 10.0000 mg | ORAL_TABLET | Freq: Every day | ORAL | 2 refills | Status: DC

## 2019-07-16 NOTE — Progress Notes (Signed)
Error in charting. Varney Biles, LPN  [id number]   Reviewed information.  Agree with assessment and plan.    Dr Nicki Reaper

## 2019-07-16 NOTE — Assessment & Plan Note (Signed)
Under continued surveillance by Dr Rogue Bussing.  Last note reviewed.  Stable.

## 2019-07-16 NOTE — Progress Notes (Addendum)
Patient ID: TRAYCEN GOYER, male   DOB: 1935-04-24, 84 y.o.   MRN: [id number]   Subjective:    Patient ID: CAILEB RHUE, male    DOB: August 18, 1935, 84 y.o.   MRN: [id number]  HPI This visit occurred during the SARS-CoV-2 public health emergency.  Safety protocols were in place, including screening questions prior to the visit, additional usage of staff PPE, and extensive cleaning of exam room while observing appropriate contact time as indicated for disinfecting solutions.  Patient here for a scheduled follow up.  He is accompanied by his wife. History obtained from both of them.  He is here to follow up on hypertension, hypercholesterolemia, CKD and afib. He is now seeing Dr Fletcher Anon.  Has f/u with cardiology next week.  Denies any significant light headedness or dizziness.  States this has improved significantly.  No syncope or near syncopal episodes.  No chest pain or tightness reported.  Still some sob with exertion, but he states this stable.  Breathing overall stable.  No acid reflux reported.  No abdominal pain or bowel change reported.  Does reports increased nasal congestioin and some occasional blood in mucus.  Not using flonase.  Only using saline nasal spray one time per day.  Taking allegra.  Being followed by oncology.  Labs revealed a low potassium.  Raised the question of stopping chlorthalidone.  Blood pressure is doing well.     Past Medical History:  Diagnosis Date  . Allergic rhinitis   . Barrett's esophagus 10/21/13  . Bone cancer ([place])   . Chronic kidney disease    stones  . Colon polyp, hyperplastic   . COPD (chronic obstructive pulmonary disease) (HCC)    mild COPD. former smoker  . Coronary artery disease   . Diverticulosis   . Fundic gland polyps of stomach, benign   . Gastritis   . GERD (gastroesophageal reflux disease)   . Hypercholesteremia   . Hypertension   . Prostate cancer ([place])   . Prostatism   . Reflux esophagitis   . Renal stones   . Skin cancer    left  cheek/lesion excised  . Sleep apnea   . TIA (transient ischemic attack)   . TIA (transient ischemic attack)    Past Surgical History:  Procedure Laterality Date  . CARDIAC CATHETERIZATION    . COLONOSCOPY WITH PROPOFOL N/A 09/13/2015   Procedure: COLONOSCOPY WITH PROPOFOL;  Surgeon: Lollie Sails, MD;  Location: [facility] ENDOSCOPY;  Service: Endoscopy;  Laterality: N/A;  . COLONOSCOPY WITH PROPOFOL N/A 05/07/2019   Procedure: COLONOSCOPY WITH PROPOFOL;  Surgeon: Robert Bellow, MD;  Location: [facility] ENDOSCOPY;  Service: Endoscopy;  Laterality: N/A;  . CORONARY ANGIOPLASTY    . ESOPHAGOGASTRODUODENOSCOPY (EGD) WITH PROPOFOL N/A 09/13/2015   Procedure: ESOPHAGOGASTRODUODENOSCOPY (EGD) WITH PROPOFOL;  Surgeon: Lollie Sails, MD;  Location: [facility] ENDOSCOPY;  Service: Endoscopy;  Laterality: N/A;  . ESOPHAGOGASTRODUODENOSCOPY (EGD) WITH PROPOFOL N/A 12/28/2015   Procedure: ESOPHAGOGASTRODUODENOSCOPY (EGD) WITH PROPOFOL;  Surgeon: Lollie Sails, MD;  Location: [facility] ENDOSCOPY;  Service: Endoscopy;  Laterality: N/A;  . ESOPHAGOGASTRODUODENOSCOPY (EGD) WITH PROPOFOL N/A 06/16/2016   Procedure: ESOPHAGOGASTRODUODENOSCOPY (EGD) WITH PROPOFOL;  Surgeon: Lollie Sails, MD;  Location: [facility] ENDOSCOPY;  Service: Endoscopy;  Laterality: N/A;  . ESOPHAGOGASTRODUODENOSCOPY (EGD) WITH PROPOFOL N/A 05/07/2019   Procedure: ESOPHAGOGASTRODUODENOSCOPY (EGD) WITH PROPOFOL;  Surgeon: Robert Bellow, MD;  Location: [facility] ENDOSCOPY;  Service: Endoscopy;  Laterality: N/A;  . EYE SURGERY  2013   CATARACT EXTRACTION  . GANGLION  CYST EXCISION Right 05/18/2017   Procedure: REMOVAL GANGLION CYST ANKLE;  Surgeon: Samara Deist, DPM;  Location: [facility] ORS;  Service: Podiatry;  Laterality: Right;  . heart cath stent    . LEFT HEART CATH AND CORONARY ANGIOGRAPHY Right 12/03/2017   Procedure: Left Heart Cath and Coronary Angiography with possible coronary intervention;  Surgeon: Dionisio David, MD;  Location: [place] [facility] CV LAB;  Service: Cardiovascular;  Laterality: Right;  . PROSTATE BIOPSY    . stents     multiple   Family History  Problem Relation Age of Onset  . Cancer Mother        gastric and lung  . Cancer Father        multiple myeloma  . Stroke Father   . Cancer Sister        leukemia  . Cancer Brother        leukemia  . Cancer Brother        kidney  . Cancer Daughter        Uterine  . Cancer Other        [place] (Sister's Son): Prostate   Social History   Socioeconomic History  . Marital status: Married    Spouse name: Not on file  . Number of children: Not on file  . Years of education: Not on file  . Highest education level: Not on file  Occupational History  . Not on file  Tobacco Use  . Smoking status: Former Smoker    Packs/day: 1.00    Years: 35.00    Pack years: 35.00    Types: Cigarettes    Quit date: 03/06/1986    Years since quitting: 33.3  . Smokeless tobacco: Former Systems developer    Types: Chew  Substance and Sexual Activity  . Alcohol use: No  . Drug use: No  . Sexual activity: Not Currently  Other Topics Concern  . Not on file  Social History Narrative  . Not on file   Social Determinants of Health   Financial Resource Strain:   . Difficulty of Paying Living Expenses:   Food Insecurity:   . Worried About Charity fundraiser in the Last Year:   . Arboriculturist in the Last Year:   Transportation Needs:   . Film/video editor (Medical):   [name] Lack of Transportation (Non-Medical):   Physical Activity:   . Days of Exercise per Week:   . Minutes of Exercise per Session:   Stress:   . Feeling of Stress :   Social Connections:   . Frequency of Communication with Friends and Family:   . Frequency of Social Gatherings with Friends and Family:   . Attends Religious Services:   . Active Member of Clubs or Organizations:   . Attends Archivist Meetings:   [name] Marital Status:     Outpatient Encounter Medications as of 07/16/2019    Medication Sig  . amLODipine (NORVASC) 10 MG tablet Take 1 tablet (10 mg total) by mouth daily.  [name] aspirin EC 81 MG tablet Take 81 mg by mouth daily.  . calcium citrate-vitamin D (CITRACAL+D) 315-200 MG-UNIT tablet Take 1 tablet by mouth daily.  . chlorthalidone (HYGROTON) 25 MG tablet Take 25 mg by mouth daily.  . cholecalciferol (VITAMIN D) 1000 units tablet Take 1,000 Units by mouth daily.   . clopidogrel (PLAVIX) 75 MG tablet Take 1 tablet (75 mg total) by mouth daily.  [name] ezetimibe (ZETIA) 10 MG tablet Take 1  tablet (10 mg total) by mouth daily.  . fexofenadine (ALLEGRA) 180 MG tablet Take 180 mg by mouth daily as needed for allergies or rhinitis.  . fluticasone (FLONASE) 50 MCG/ACT nasal spray Place 2 sprays into both nostrils daily.  . isosorbide mononitrate (IMDUR) 30 MG 24 hr tablet TAKE 1 TABLET BY MOUTH ONCE DAILY  . losartan (COZAAR) 50 MG tablet Take 1 tablet (50 mg total) by mouth daily.  [name] lovastatin (MEVACOR) 40 MG tablet TAKE 2 TABLETS BY MOUTH AT BEDTIME  . Multiple Vitamin (MULTI-VITAMINS) TABS Take 1 tablet by mouth daily.   . nitroGLYCERIN (NITROSTAT) 0.4 MG SL tablet Place 0.4 mg under the tongue every 5 (five) minutes as needed for chest pain.  . pantoprazole (PROTONIX) 40 MG tablet Take 40 mg by mouth 2 (two) times daily.   [name] POLYETHYLENE GLYCOL 3350 PO Take by mouth.  . potassium chloride SA (KLOR-CON) 20 MEQ tablet TAKE 1 TABLET BY MOUTH ONCE DAILY  . [DISCONTINUED] amLODipine (NORVASC) 10 MG tablet Take 10 mg by mouth daily.  . [DISCONTINUED] amLODipine (NORVASC) 2.5 MG tablet TAKE 1 TABLET BY MOUTH ONCE DAILY (Patient taking differently: 10 mg daily. )  . [DISCONTINUED] ezetimibe (ZETIA) 10 MG tablet Take 10 mg by mouth daily.   No facility-administered encounter medications on file as of 07/16/2019.    Review of Systems  Constitutional: Negative for appetite change and unexpected weight change.  HENT: Positive for congestion. Negative for sinus pressure.    Respiratory: Negative for cough, chest tightness and shortness of breath.   Cardiovascular: Negative for chest pain, palpitations and leg swelling.  Gastrointestinal: Negative for abdominal pain, diarrhea, nausea and vomiting.  Genitourinary: Negative for difficulty urinating and dysuria.  Musculoskeletal: Negative for joint swelling and myalgias.  Skin: Negative for color change and rash.  Neurological: Negative for dizziness, light-headedness and headaches.  Psychiatric/Behavioral: Negative for agitation and dysphoric mood.       Objective:    Physical Exam Constitutional:      General: He is not in acute distress.    Appearance: Normal appearance. He is well-developed.  HENT:     Head: Normocephalic.  Cardiovascular:     Rate and Rhythm: Normal rate and regular rhythm.  Pulmonary:     Effort: Pulmonary effort is normal. No respiratory distress.     Breath sounds: Normal breath sounds.  Abdominal:     General: Bowel sounds are normal.     Palpations: Abdomen is soft.     Tenderness: There is no abdominal tenderness.  Neurological:     Mental Status: He is alert.  Psychiatric:        Behavior: Behavior normal.     BP 130/72   Pulse 77   Temp (!) 97.1 F (36.2 C)   Resp 16   Ht '5\' 10"'  (1.778 m)   Wt 213 lb (96.6 kg)   SpO2 99%   BMI 30.56 kg/m  Wt Readings from Last 3 Encounters:  07/16/19 213 lb (96.6 kg)  07/16/19 213 lb (96.6 kg)  07/16/19 211 lb (95.7 kg)     Lab Results  Component Value Date   WBC 5.9 07/01/2019   HGB 12.0 (L) 07/01/2019   HCT 34.5 (L) 07/01/2019   PLT 208 07/01/2019   GLUCOSE 102 (H) 07/01/2019   CHOL 110 03/31/2019   TRIG 142.0 03/31/2019   HDL 31.40 (L) 03/31/2019   LDLCALC 50 03/31/2019   ALT 18 07/01/2019   AST 24 07/01/2019   NA 137  07/01/2019   K 3.3 (L) 07/01/2019   CL 101 07/01/2019   CREATININE 1.32 (H) 07/01/2019   BUN 24 (H) 07/01/2019   CO2 26 07/01/2019   TSH 1.82 03/31/2019   PSA 5.47 (H) 08/03/2016    HGBA1C 6.0 03/31/2019       Assessment & Plan:   Problem List Items Addressed This Visit    Abdominal aortic aneurysm (AAA) ([place])    Evaluated by [facility] 01/2019.   Recommended f/u in 6 months.        Relevant Medications   ezetimibe (ZETIA) 10 MG tablet   amLODipine (NORVASC) 10 MG tablet   Atrial fibrillation, new onset ([place])    Has been followed by cardiology.  Appears to have regular rhythm on exam today.        Relevant Medications   ezetimibe (ZETIA) 10 MG tablet   amLODipine (NORVASC) 10 MG tablet   B12 deficiency    On B12 injections.  Recheck B12 level next labs.        Relevant Orders   Vitamin B12   Carotid artery disease ([place])    Recent f/u - <70%.  Stable.  Recommended f/u in 6 months.  continue plavix and lovastatin.        Relevant Medications   ezetimibe (ZETIA) 10 MG tablet   amLODipine (NORVASC) 10 MG tablet   CKD (chronic kidney disease)    Last GFR 50.  Continue to avoid antiinflammatories.  Follow metabolic panel.  Decrease chlorthalidone.        Relevant Orders   Basic metabolic panel   COPD (chronic obstructive pulmonary disease) ([place])    Has been followed by pulmonary.  Breathing stable.        Dizziness    Has had extensive w/up as outlined in previous notes.  Not a significant issue for him now.  Follow.        Foot pain    Describes intermittent numb feeling -  heel - unilateral.  Discussed supports.  Sensation intact.  Refer to podiatry.        Relevant Orders   Ambulatory referral to Podiatry   GERD without esophagitis    S/p EGD 05/2019.  On protonix.  Upper symptoms controlled.        Hypercholesterolemia    On lovastatin.  Low cholesterol diet and exercise.  Follow lipid panel and liver function tests.        Relevant Medications   ezetimibe (ZETIA) 10 MG tablet   amLODipine (NORVASC) 10 MG tablet   Other Relevant Orders   Hepatic function panel   Lipid panel   Hyperglycemia    Low carb diet and exercise.  Follow met b  and a1c.        Relevant Orders   Hemoglobin A1c   Hypertension, essential    Blood pressure doing well.  On chlorthalidone.  Problems with hypokalemia.  Decrease to 1/2 tablet q day.  Continue isosorbide, losartan and amlodipine.  Follow pressures.  Follow metabolic panel. May be able to get him off chlorthalidone.  No evidence of volume overload today.        Relevant Medications   ezetimibe (ZETIA) 10 MG tablet   amLODipine (NORVASC) 10 MG tablet   Iron deficiency anemia due to chronic blood loss    Colonoscopy 05/2019 as outlined.  Follow cbc and iron studies.        Malignant neoplasm of prostate ([place])    Under continued surveillance by Dr Rogue Bussing.  Last note reviewed.  Stable.       Nasal congestion    Saline nasal spray 2-3x/day.  Follow.  Continues on allegra.  Let me know if persistent problems.        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  Last evaluated 01/2019.  Stable.  Recommended annual follow up.        Relevant Medications   ezetimibe (ZETIA) 10 MG tablet   amLODipine (NORVASC) 10 MG tablet   Sleep apnea    Using cpap.  Uses regularly.  Clear nasal congestion as outlined.           I spent 45 minutes with the patient and more than 50% of the time was spent in consultation regarding the above.  Time spent discussing his current symptoms and concerns as well as treatment options and plans for further w/up and evaluation.    Einar Pheasant, MD

## 2019-07-16 NOTE — Assessment & Plan Note (Signed)
Evaluated by [facility] 01/2019.   Recommended f/u in 6 months.

## 2019-07-16 NOTE — Assessment & Plan Note (Signed)
Last GFR 50.  Continue to avoid antiinflammatories.  Follow metabolic panel.  Decrease chlorthalidone.

## 2019-07-16 NOTE — Assessment & Plan Note (Signed)
Blood pressure doing well.  On chlorthalidone.  Problems with hypokalemia.  Decrease to 1/2 tablet q day.  Continue isosorbide, losartan and amlodipine.  Follow pressures.  Follow metabolic panel. May be able to get him off chlorthalidone.  No evidence of volume overload today.

## 2019-07-16 NOTE — Telephone Encounter (Signed)
My chart message sent to pt to confirm f/u lab appt.

## 2019-07-16 NOTE — Assessment & Plan Note (Signed)
Using cpap.  Uses regularly.  Clear nasal congestion as outlined.

## 2019-07-16 NOTE — Assessment & Plan Note (Signed)
Low carb diet and exercise.  Follow met b and a1c.   

## 2019-07-16 NOTE — Assessment & Plan Note (Signed)
On B12 injections.  Recheck B12 level next labs.

## 2019-07-16 NOTE — Assessment & Plan Note (Signed)
Has been followed by pulmonary.  Breathing stable.  

## 2019-07-16 NOTE — Assessment & Plan Note (Signed)
S/p EGD 05/2019.  On protonix.  Upper symptoms controlled.

## 2019-07-16 NOTE — Assessment & Plan Note (Signed)
Saline nasal spray 2-3x/day.  Follow.  Continues on allegra.  Let me know if persistent problems.

## 2019-07-16 NOTE — Assessment & Plan Note (Signed)
Recent f/u - <70%.  Stable.  Recommended f/u in 6 months.  continue plavix and lovastatin.

## 2019-07-16 NOTE — Addendum Note (Signed)
Addended by: Alisa Graff on: 07/16/2019 03:04 PM   Modules accepted: Orders

## 2019-07-16 NOTE — Assessment & Plan Note (Signed)
Evaluated by [facility].  Last evaluated 01/2019.  Stable.  Recommended annual follow up.

## 2019-07-16 NOTE — Progress Notes (Addendum)
Subjective:   Nathaniel Thompson is a 84 y.o. male who presents for Medicare Annual/Subsequent preventive examination.  Review of Systems:  No ROS.  Medicare Wellness Virtual Visit.  Visual/audio telehealth visit, UTA vital signs.   Ht/Wt provided.  See social history for additional risk factors.         Objective:    Vitals: There were no vitals taken for this visit.  There is no height or weight on file to calculate BMI.  Advanced Directives 07/16/2019 07/01/2019 05/07/2019 04/22/2019 03/25/2019 12/25/2018 12/24/2018  Does Patient Have a Medical Advance Directive? No No Yes No Yes Yes Yes  Type of Advance Directive - Public librarian;Living will - [place];Living will - Living will;Healthcare Power of Attorney  Does patient want to make changes to medical advance directive? - - - - No - Patient declined No - Patient declined No - Patient declined  Copy of [place] in Chart? - - - - No - copy requested - No - copy requested  Would patient like information on creating a medical advance directive? No - Patient declined No - Patient declined - No - Patient declined No - Patient declined - -    Tobacco Social History   Tobacco Use  Smoking Status Former Smoker  . Packs/day: 1.00  . Years: 35.00  . Pack years: 35.00  . Types: Cigarettes  . Quit date: 03/06/1986  . Years since quitting: 33.3  Smokeless Tobacco Former Systems developer  . Types: Chew     Counseling given: Not Answered   Clinical Intake:                       Past Medical History:  Diagnosis Date  . Allergic rhinitis   . Barrett's esophagus 10/21/13  . Bone cancer ([place])   . Chronic kidney disease    stones  . Colon polyp, hyperplastic   . COPD (chronic obstructive pulmonary disease) (HCC)    mild COPD. former smoker  . Coronary artery disease   . Diverticulosis   . Fundic gland polyps of stomach, benign   . Gastritis   . GERD (gastroesophageal reflux  disease)   . Hypercholesteremia   . Hypertension   . Prostate cancer ([place])   . Prostatism   . Reflux esophagitis   . Renal stones   . Skin cancer    left cheek/lesion excised  . Sleep apnea   . TIA (transient ischemic attack)   . TIA (transient ischemic attack)    Past Surgical History:  Procedure Laterality Date  . CARDIAC CATHETERIZATION    . COLONOSCOPY WITH PROPOFOL N/A 09/13/2015   Procedure: COLONOSCOPY WITH PROPOFOL;  Surgeon: Lollie Sails, MD;  Location[facility] ENDOSCOPY;  Service: Endoscopy;  Laterality: N/A;  . COLONOSCOPY WITH PROPOFOL N/A 05/07/2019   Procedure: COLONOSCOPY WITH PROPOFOL;  Surgeon: Robert Bellow, MD;  Location: [facility] ENDOSCOPY;  Service: Endoscopy;  Laterality: N/A;  . CORONARY ANGIOPLASTY    . ESOPHAGOGASTRODUODENOSCOPY (EGD) WITH PROPOFOL N/A 09/13/2015   Procedure: ESOPHAGOGASTRODUODENOSCOPY (EGD) WITH PROPOFOL;  Surgeon: Lollie Sails, MD;  Location: [facility] ENDOSCOPY;  Service: Endoscopy;  Laterality: N/A;  . ESOPHAGOGASTRODUODENOSCOPY (EGD) WITH PROPOFOL N/A 12/28/2015   Procedure: ESOPHAGOGASTRODUODENOSCOPY (EGD) WITH PROPOFOL;  Surgeon: Lollie Sails, MD;  Location: [facility] ENDOSCOPY;  Service: Endoscopy;  Laterality: N/A;  . ESOPHAGOGASTRODUODENOSCOPY (EGD) WITH PROPOFOL N/A 06/16/2016   Procedure: ESOPHAGOGASTRODUODENOSCOPY (EGD) WITH PROPOFOL;  Surgeon: Lollie Sails, MD;  Location: [facility] ENDOSCOPY;  Service: Endoscopy;  Laterality: N/A;  . ESOPHAGOGASTRODUODENOSCOPY (EGD) WITH PROPOFOL N/A 05/07/2019   Procedure: ESOPHAGOGASTRODUODENOSCOPY (EGD) WITH PROPOFOL;  Surgeon: Robert Bellow, MD;  Location: [facility] ENDOSCOPY;  Service: Endoscopy;  Laterality: N/A;  . EYE SURGERY  2013   CATARACT EXTRACTION  . GANGLION CYST EXCISION Right 05/18/2017   Procedure: REMOVAL GANGLION CYST ANKLE;  Surgeon: Samara Deist, DPM;  Location: [facility] ORS;  Service: Podiatry;  Laterality: Right;  . heart cath stent    . LEFT HEART CATH AND CORONARY ANGIOGRAPHY  Right 12/03/2017   Procedure: Left Heart Cath and Coronary Angiography with possible coronary intervention;  Surgeon: Dionisio David, MD;  Location: [place] CV LAB;  Service: Cardiovascular;  Laterality: Right;  . PROSTATE BIOPSY    . stents     multiple   Family History  Problem Relation Age of Onset  . Cancer Mother        gastric and lung  . Cancer Father        multiple myeloma  . Stroke Father   . Cancer Sister        leukemia  . Cancer Brother        leukemia  . Cancer Brother        kidney  . Cancer Daughter        Uterine  . Cancer Other        [place] (Sister's Son): Prostate   Social History   Socioeconomic History  . Marital status: Married    Spouse name: Not on file  . Number of children: Not on file  . Years of education: Not on file  . Highest education level: Not on file  Occupational History  . Not on file  Tobacco Use  . Smoking status: Former Smoker    Packs/day: 1.00    Years: 35.00    Pack years: 35.00    Types: Cigarettes    Quit date: 03/06/1986    Years since quitting: 33.3  . Smokeless tobacco: Former Systems developer    Types: Chew  Substance and Sexual Activity  . Alcohol use: No  . Drug use: No  . Sexual activity: Not Currently  Other Topics Concern  . Not on file  Social History Narrative  . Not on file   Social Determinants of Health   Financial Resource Strain:   . Difficulty of Paying Living Expenses:   Food Insecurity:   . Worried About Charity fundraiser in the Last Year:   . Arboriculturist in the Last Year:   Transportation Needs:   . Film/video editor (Medical):   [name] Lack of Transportation (Non-Medical):   Physical Activity:   . Days of Exercise per Week:   . Minutes of Exercise per Session:   Stress:   . Feeling of Stress :   Social Connections:   . Frequency of Communication with Friends and Family:   . Frequency of Social Gatherings with Friends and Family:   . Attends Religious Services:   . Active Member of  Clubs or Organizations:   . Attends Archivist Meetings:   [name] Marital Status:     Outpatient Encounter Medications as of 07/16/2019  Medication Sig  . amLODipine (NORVASC) 10 MG tablet Take 1 tablet (10 mg total) by mouth daily.  [name] aspirin EC 81 MG tablet Take 81 mg by mouth daily.  . calcium citrate-vitamin D (CITRACAL+D) 315-200 MG-UNIT tablet Take 1 tablet by mouth daily.  [name]  chlorthalidone (HYGROTON) 25 MG tablet Take 25 mg by mouth daily.  . cholecalciferol (VITAMIN D) 1000 units tablet Take 1,000 Units by mouth daily.   . clopidogrel (PLAVIX) 75 MG tablet Take 1 tablet (75 mg total) by mouth daily.  [name] ezetimibe (ZETIA) 10 MG tablet Take 1 tablet (10 mg total) by mouth daily.  . fexofenadine (ALLEGRA) 180 MG tablet Take 180 mg by mouth daily as needed for allergies or rhinitis.  . fluticasone (FLONASE) 50 MCG/ACT nasal spray Place 2 sprays into both nostrils daily.  . isosorbide mononitrate (IMDUR) 30 MG 24 hr tablet TAKE 1 TABLET BY MOUTH ONCE DAILY  . losartan (COZAAR) 50 MG tablet Take 1 tablet (50 mg total) by mouth daily.  [name] lovastatin (MEVACOR) 40 MG tablet TAKE 2 TABLETS BY MOUTH AT BEDTIME  . Multiple Vitamin (MULTI-VITAMINS) TABS Take 1 tablet by mouth daily.   . nitroGLYCERIN (NITROSTAT) 0.4 MG SL tablet Place 0.4 mg under the tongue every 5 (five) minutes as needed for chest pain.  . pantoprazole (PROTONIX) 40 MG tablet Take 40 mg by mouth 2 (two) times daily.   [name] POLYETHYLENE GLYCOL 3350 PO Take by mouth.  . potassium chloride SA (KLOR-CON) 20 MEQ tablet TAKE 1 TABLET BY MOUTH ONCE DAILY   No facility-administered encounter medications on file as of 07/16/2019.    Activities of Daily Living In your present state of health, do you have any difficulty performing the following activities: 07/16/2019  Hearing? Y  Vision? N  Difficulty concentrating or making decisions? N  Walking or climbing stairs? N  Dressing or bathing? N  Doing errands, shopping? N  Preparing  Food and eating ? N  Using the Toilet? N  In the past six months, have you accidently leaked urine? N  Do you have problems with loss of bowel control? N  Managing your Medications? N  Managing your Finances? N  Housekeeping or managing your Housekeeping? N  Some recent data might be hidden    Patient Care Team: Einar Pheasant, MD as PCP - General (Internal Medicine) [name], MD as PCP - Cardiology (Cardiology) [name], MD as Consulting Physician (Cardiology)   Assessment:    Assessment  This is a routine wellness examination for [facility].  Nurse connected withpatient 07/16/19 at  9:30 AM EDT by a telephone enabled telemedicine application, no video access and verified that I am speaking with the correct person using two identifiers. Patient stated full name and DOB. Patient gave permission to continue with  visit. Patient's location was at home and Nurse's location was at [place] office.   Patient is alert and oriented x3. Patient denies difficulty focusing or concentrating. Patient likes to read, complete word search puzzles and use the ipad to play games for brain health.   Health Maintenance Due: -Tdap vaccine- discussed; to be completed with doctor in visit or local pharmacy.  Deferred.  See completed HM at the end of note.   Eye: Cataract extraction, bilateral Visual acuity not assessed, virtual visit.  They have seen their ophthalmologist.  Dental: UTD  Hearing: Hearing aids  Safety:  Patient feels safe at home- yes Patient does have smoke detectors at home- yes Patient does wear sunscreen or protective clothing when in direct sunlight - yes Patient does wear seat belt when in a moving vehicle - yes Patient drives- yes Adequate lighting in walkways free from debris- yes Grab bars and handrails used as appropriate- yes Ambulates with an assistive device- no; cane  available if needed Cell phone on person when ambulating outside of the  home- yes  Social: Alcohol intake - no      Smoking history- former   Smokers in home? none Illicit drug use? none  Medication: Taking as directed and without issues.  Pill box in use -yes  Self managed - yes   Covid-19: Precautions and sickness symptoms discussed. Wears mask, social distancing, hand hygiene as appropriate.   Activities of Daily Living Patient denies needing assistance with: household chores, feeding themselves, getting from bed to chair, getting to the toilet, bathing/showering, dressing, managing money, or preparing meals.   Discussed the importance of a healthy diet, water intake and the benefits of aerobic exercise.  Physical activity- work outside in the building and walking   Diet:  Regular Water: good intake Caffeine: 1 cup of coffee  Other Providers Patient Care Team: Einar Pheasant, MD as PCP - General (Internal Medicine) [name], MD as PCP - Cardiology (Cardiology) [name], MD as Consulting Physician (Cardiology)  Exercise Activities and Dietary recommendations        Goals    . Follow up with pcp as needed        Fall Risk Fall Risk  07/16/2019 04/02/2019 07/11/2017 08/07/2016 04/13/2016  Falls in the past year? 0 0 Yes No No  Number falls in past yr: - - 1 - -  Injury with Fall? - - No - -  Comment - - Minor bruising. Observation stay at the hospital. - -  Follow up Falls evaluation completed Falls evaluation completed Education provided - -   Timed Get Up and Go Performed: no ,virtual visit  Depression Screen PHQ 2/9 Scores 07/16/2019 07/15/2018 07/11/2017 08/07/2016  PHQ - 2 Score 0 0 0 0    Cognitive Function 6CIT Screen 07/16/2019 07/15/2018 07/11/2017  What Year? 0 points 0 points 0 points  What month? 0 points 0 points 0 points  What time? 0 points 0 points 0 points  Count back from 20 - 0 points 0 points  Months in reverse 0 points 0 points 0 points  Repeat phrase - 0 points 0 points  Total Score  - 0 0        Immunization History  Administered Date(s) Administered  . Fluad Quad(high Dose 65+) 11/05/2018  . Influenza, High Dose Seasonal PF 11/28/2016  . Influenza-Unspecified 12/05/2011, 12/05/2015, 11/04/2017  . PFIZER SARS-COV-2 Vaccination 03/25/2019, 04/19/2019  . Pneumococcal Conjugate-13 07/12/2016  . Pneumococcal Polysaccharide-23 03/06/2006   Screening Tests     Health Maintenance  Topic Date Due  . TETANUS/TDAP  07/15/2020 (Originally 12/03/1954)  . INFLUENZA VACCINE  10/05/2019  . COVID-19 Vaccine  Completed  . PNA vac Low Risk Adult  Completed       Plan:   Plan  Keep all routine maintenance appointments.   Follow up today in office with PMD.  Medicare Attestation I have personally reviewed: The patient's medical and social history Their use of alcohol, tobacco or illicit drugs Their current medications and supplements The patient's functional ability including ADLs,fall risks, home safety risks, cognitive, and hearing and visual impairment Diet and physical activities Evidence for depression   I have reviewed and discussed with patient certain preventive protocols, quality metrics, and best practice recommendations.      Varney Biles, LPN                       07/16/2019  Reviewed above information.  Agree  with assessment and plan.    Dr Nicki Reaper

## 2019-07-16 NOTE — Patient Instructions (Addendum)
  Nathaniel Thompson , Thank you for taking time to come for your Medicare Wellness Visit. I appreciate your ongoing commitment to your health goals. Please review the following plan we discussed and let me know if I can assist you in the future.   These are the goals we discussed: Goals    . Follow up with pcp as needed       This is a list of the screening recommended for you and due dates:  Health Maintenance  Topic Date Due  . Tetanus Vaccine  07/15/2020*  . Flu Shot  10/05/2019  . COVID-19 Vaccine  Completed  . Pneumonia vaccines  Completed  *Topic was postponed. The date shown is not the original due date.

## 2019-07-16 NOTE — Patient Instructions (Signed)
Decrease chlorthalidone (hygroton) to 1/2 tablet per day.

## 2019-07-16 NOTE — Assessment & Plan Note (Signed)
Describes intermittent numb feeling -  heel - unilateral.  Discussed supports.  Sensation intact.  Refer to podiatry.

## 2019-07-16 NOTE — Assessment & Plan Note (Signed)
Has been followed by cardiology.  Appears to have regular rhythm on exam today.

## 2019-07-16 NOTE — Assessment & Plan Note (Signed)
Has had extensive w/up as outlined in previous notes.  Not a significant issue for him now.  Follow.

## 2019-07-16 NOTE — Assessment & Plan Note (Signed)
On lovastatin.  Low cholesterol diet and exercise.  Follow lipid panel and liver function tests.   

## 2019-07-16 NOTE — Assessment & Plan Note (Signed)
Colonoscopy 05/2019 as outlined.  Follow cbc and iron studies.   

## 2019-07-24 ENCOUNTER — Other Ambulatory Visit: Payer: Self-pay

## 2019-07-24 ENCOUNTER — Ambulatory Visit (INDEPENDENT_AMBULATORY_CARE_PROVIDER_SITE_OTHER): Payer: Medicare HMO | Admitting: Nurse Practitioner

## 2019-07-24 ENCOUNTER — Encounter: Payer: Self-pay | Admitting: Nurse Practitioner

## 2019-07-24 VITALS — BP 118/62 | HR 74 | Ht 69.0 in | Wt 214.2 lb

## 2019-07-24 DIAGNOSIS — E785 Hyperlipidemia, unspecified: Secondary | ICD-10-CM

## 2019-07-24 DIAGNOSIS — I2510 Atherosclerotic heart disease of native coronary artery without angina pectoris: Secondary | ICD-10-CM | POA: Diagnosis not present

## 2019-07-24 DIAGNOSIS — I5032 Chronic diastolic (congestive) heart failure: Secondary | ICD-10-CM | POA: Diagnosis not present

## 2019-07-24 DIAGNOSIS — E876 Hypokalemia: Secondary | ICD-10-CM

## 2019-07-24 DIAGNOSIS — I10 Essential (primary) hypertension: Secondary | ICD-10-CM

## 2019-07-24 DIAGNOSIS — N1831 Chronic kidney disease, stage 3a: Secondary | ICD-10-CM

## 2019-07-24 MED ORDER — CLOPIDOGREL BISULFATE 75 MG PO TABS: 75.0000 mg | ORAL_TABLET | Freq: Every day | ORAL | 3 refills | Status: DC

## 2019-07-24 NOTE — Progress Notes (Signed)
Office Visit    Patient Name: Nathaniel Thompson Date of Encounter: 07/24/2019  Primary Care Provider:  Einar Pheasant, MD Primary Cardiologist:  Nathaniel Sacramento, MD  Chief Complaint    84 year old male with a history of CAD status post prior RCA and circumflex stenting, HFpEF, hypertension, hyperlipidemia, metastatic prostate cancer, COPD, stage II-III CKD, Barrett's esophagus, TIA, and nephrolithiasis, who presents for follow-up related to hypertension, hypokalemia, and CAD.  Past Medical History    Past Medical History:  Diagnosis Date  . (HFpEF) heart failure with preserved ejection fraction (Nathaniel Thompson)    a. 05/2018 Echo: Nl EF, Gr1 DD, sev dil LA, mild MR/TR, mild PAH; b. 05/2019 Echo: EF 55-60%, no rwma, mild LVH. Nl PASP. Mildly dil LA. Triv MR. Mild AoV sclerosis w/o stenosis. Mildly dil Asc AO (90mm).  . Allergic rhinitis   . Barrett's esophagus 10/21/13  . Bone cancer (Nathaniel Thompson)   . CKD (chronic kidney disease), stage III   . Colon polyp, hyperplastic   . COPD (chronic obstructive pulmonary disease) (HCC)    mild COPD. former smoker  . Coronary artery disease    a. 2003 s/p PCI ([facility]); b. 2004 s/p PCI x 2 ([facility]); c. 11/2017 Cath: LM nl, LAD mild dzs, LCX patent stent w/ 30% distal edge restenosis, RCA patent distal stent w/ 30% prox edge stenosis. Nl EF->Med Rx.  . Diverticulosis   . Fundic gland polyps of stomach, benign   . Gastritis   . GERD (gastroesophageal reflux disease)   . Hypercholesteremia   . Hypertension   . Prostate cancer (Nathaniel Thompson)   . Prostatism   . Reflux esophagitis   . Renal stones   . Skin cancer    left cheek/lesion excised  . Sleep apnea   . Tachycardia    a. 11/2017 admit w/ tachycardia/? afib-->no afib noted upon review (amio/OAC d/c'd).  [name] TIA (transient ischemic attack)    Past Surgical History:  Procedure Laterality Date  . CARDIAC CATHETERIZATION    . COLONOSCOPY WITH PROPOFOL N/A 09/13/2015   Procedure: COLONOSCOPY WITH PROPOFOL;  Surgeon: Nathaniel Sails, MD;  Location: [facility] ENDOSCOPY;  Service: Endoscopy;  Laterality: N/A;  . COLONOSCOPY WITH PROPOFOL N/A 05/07/2019   Procedure: COLONOSCOPY WITH PROPOFOL;  Surgeon: Nathaniel Bellow, MD;  Location: [facility] ENDOSCOPY;  Service: Endoscopy;  Laterality: N/A;  . CORONARY ANGIOPLASTY    . ESOPHAGOGASTRODUODENOSCOPY (EGD) WITH PROPOFOL N/A 09/13/2015   Procedure: ESOPHAGOGASTRODUODENOSCOPY (EGD) WITH PROPOFOL;  Surgeon: Nathaniel Sails, MD;  Location: [facility] ENDOSCOPY;  Service: Endoscopy;  Laterality: N/A;  . ESOPHAGOGASTRODUODENOSCOPY (EGD) WITH PROPOFOL N/A 12/28/2015   Procedure: ESOPHAGOGASTRODUODENOSCOPY (EGD) WITH PROPOFOL;  Surgeon: Nathaniel Sails, MD;  Location: [facility] ENDOSCOPY;  Service: Endoscopy;  Laterality: N/A;  . ESOPHAGOGASTRODUODENOSCOPY (EGD) WITH PROPOFOL N/A 06/16/2016   Procedure: ESOPHAGOGASTRODUODENOSCOPY (EGD) WITH PROPOFOL;  Surgeon: Nathaniel Sails, MD;  Location: [facility] ENDOSCOPY;  Service: Endoscopy;  Laterality: N/A;  . ESOPHAGOGASTRODUODENOSCOPY (EGD) WITH PROPOFOL N/A 05/07/2019   Procedure: ESOPHAGOGASTRODUODENOSCOPY (EGD) WITH PROPOFOL;  Surgeon: Nathaniel Bellow, MD;  Location: [facility] ENDOSCOPY;  Service: Endoscopy;  Laterality: N/A;  . EYE SURGERY  2013   CATARACT EXTRACTION  . GANGLION CYST EXCISION Right 05/18/2017   Procedure: REMOVAL GANGLION CYST ANKLE;  Surgeon: Nathaniel Thompson, DPM;  Location: [facility] ORS;  Service: Podiatry;  Laterality: Right;  . heart cath stent    . LEFT HEART CATH AND CORONARY ANGIOGRAPHY Right 12/03/2017   Procedure: Left Heart Cath and Coronary Angiography with possible coronary intervention;  Surgeon: Nathaniel David, MD;  Location: [facility];  Service: Cardiovascular;  Laterality: Right;  . PROSTATE BIOPSY    . stents     multiple    Allergies  Allergies  Allergen Reactions  . Atorvastatin Other (See Comments)    Achy joints    History of Present Illness    84 year old male with the above complex past medical  history including coronary artery disease, HFpEF, hypertension, hyperlipidemia, metastatic prostate cancer, COPD, stage II-III chronic kidney disease, Barrett's esophagus, TIAs, and nephrolithiasis.  He previously underwent stenting of the left circumflex and RCA with initial PCI performed at [facility] in 2003 followed by repeat PCI x2 at [facility] in 2004.  Most recent catheterization in 2019 showed a patent circumflex stent with 30% distal edge restenosis as well as a patent distal RCA stent with 30% proximal stenosis.  EF was normal.  In September 2019, he was also hospitalized with tachycardia with question of atrial fibrillation.  He was initially placed on amiodarone and anticoagulation but all of his EKGs revealed sinus rhythm at the time and thus amiodarone and Eliquis were discontinued.  He has not had any recurrent arrhythmias since then.  Mr. Nathaniel Thompson establish care with Dr. Fletcher Thompson in February 2021.  Follow-up echocardiogram in in March 2021 showed an EF of 55 to 65% with mild LVH and mildly dilated ascending aorta of 39 mm.  He has been followed closely by oncology in the setting of metastatic prostate cancer.  He has been noted on multiple lab recordings to be hypokalemic and as result, his chlorthalidone dose was reduced from 25 mg daily to 12.5 mg daily last week.  His most recent potassium was 3.3 on April 27 and he is due for follow-up labs through his primary care provider next week.  Since his last visit here in February, he has done well.  He denies chest pain but does have some degree of chronic dyspnea on exertion, which has been stable.  He has mild, chronic right greater than left lower extremity edema which is managed with support hose and also chlorthalidone therapy.  Though he does not typically add salt to food, he and his wife to go out to eat frequently.  He denies palpitations, PND, orthopnea, dizziness, syncope, or early satiety.  He has had brief episodes of left arm weakness and was  evaluated by neurology with recommendation to continue aspirin and Plavix in the setting of known prior TIAs.  Home Medications    Prior to Admission medications   Medication Sig Start Date End Date Taking? Authorizing Provider  amLODipine (NORVASC) 10 MG tablet Take 1 tablet (10 mg total) by mouth daily. 07/16/19   Nathaniel Pheasant, MD  aspirin EC 81 MG tablet Take 81 mg by mouth daily.    [provider]  calcium citrate-vitamin D (CITRACAL+D) 315-200 MG-UNIT tablet Take 1 tablet by mouth daily.    [provider]  chlorthalidone (HYGROTON) 25 MG tablet Take 25 mg by mouth daily. 03/12/19   [provider]  cholecalciferol (VITAMIN D) 1000 units tablet Take 1,000 Units by mouth daily.     [provider]  clopidogrel (PLAVIX) 75 MG tablet Take 1 tablet (75 mg total) by mouth daily. 08/04/16   Nathaniel Pheasant, MD  ezetimibe (ZETIA) 10 MG tablet Take 1 tablet (10 mg total) by mouth daily. 07/16/19   Nathaniel Pheasant, MD  fexofenadine (ALLEGRA) 180 MG tablet Take 180 mg by mouth daily as needed for allergies  or rhinitis.    [provider]  fluticasone (FLONASE) 50 MCG/ACT nasal spray Place 2 sprays into both nostrils daily.    [provider]  isosorbide mononitrate (IMDUR) 30 MG 24 hr tablet TAKE 1 TABLET BY MOUTH ONCE DAILY 04/22/19   Nathaniel Pheasant, MD  losartan (COZAAR) 50 MG tablet Take 1 tablet (50 mg total) by mouth daily. 11/22/18   Nathaniel Pheasant, MD  lovastatin (MEVACOR) 40 MG tablet TAKE 2 TABLETS BY MOUTH AT BEDTIME 06/27/19   Nathaniel Pheasant, MD  Multiple Vitamin (MULTI-VITAMINS) TABS Take 1 tablet by mouth daily.     [provider]  nitroGLYCERIN (NITROSTAT) 0.4 MG SL tablet Place 0.4 mg under the tongue every 5 (five) minutes as needed for chest pain.    [provider]  pantoprazole (PROTONIX) 40 MG tablet Take 40 mg by mouth 2 (two) times daily.     [provider]  POLYETHYLENE GLYCOL 3350 PO Take by  mouth.    [provider]  potassium chloride SA (KLOR-CON) 20 MEQ tablet TAKE 1 TABLET BY MOUTH ONCE DAILY 07/15/19   Cammie Sickle, MD    Review of Systems    Chronic dyspnea on exertion and mild lower extremity swelling, both have been stable.  He notes brief episodes of left arm weakness that have been evaluated by neurology.  He denies chest pain, palpitations, PND, orthopnea, dizziness, syncope, or early satiety.  All other systems reviewed and are otherwise negative except as noted above.  Physical Exam    VS:  BP 118/62 (BP Location: Left Arm, Patient Position: Sitting, Cuff Size: Normal)   Pulse 74   Ht 5\' 9"  (1.753 m)   Wt 214 lb 4 oz (97.2 kg)   SpO2 99%   BMI 31.64 kg/m  , BMI Body mass index is 31.64 kg/m. GEN: Well nourished, well developed, in no acute distress. HEENT: normal. Neck: Supple, no JVD, carotid bruits, or masses. Cardiac: RRR, no murmurs, rubs, or gallops. No clubbing, cyanosis.  1+ bilateral lower extremity edema just above his sock line.  Radials/PT 2+ and equal bilaterally.  Respiratory:  Respirations regular and unlabored, clear to auscultation bilaterally. GI: Soft, protuberant, nontender, nondistended, BS + x 4. MS: no deformity or atrophy. Skin: warm and dry, no rash. Neuro:  Strength and sensation are intact. Psych: Normal affect.  Accessory Clinical Findings    ECG personally reviewed by me today - Regular sinus rhythm, 74, nonspecific ST/T changes - no acute changes.  Lab Results  Component Value Date   WBC 5.9 07/01/2019   HGB 12.0 (L) 07/01/2019   HCT 34.5 (L) 07/01/2019   MCV 83.9 07/01/2019   PLT 208 07/01/2019   Lab Results  Component Value Date   CREATININE 1.32 (H) 07/01/2019   BUN 24 (H) 07/01/2019   NA 137 07/01/2019   K 3.3 (L) 07/01/2019   CL 101 07/01/2019   CO2 26 07/01/2019   Lab Results  Component Value Date   ALT 18 07/01/2019   AST 24 07/01/2019   ALKPHOS 46 07/01/2019   BILITOT 0.7 07/01/2019    Lab Results  Component Value Date   CHOL 110 03/31/2019   HDL 31.40 (L) 03/31/2019   LDLCALC 50 03/31/2019   TRIG 142.0 03/31/2019   CHOLHDL 4 03/31/2019    Lab Results  Component Value Date   HGBA1C 6.0 03/31/2019    Assessment & Plan    1.  Coronary artery disease: Status post prior RCA and circumflex  interventions with patent stents and minimal in-stent restenosis on most recent catheterization in 2019.  He has been doing well without any chest pain.  He has chronic, stable dyspnea on exertion.  He remains on aspirin, Plavix, statin, Zetia, and nitrate therapy.  2.  HFpEF: Patient with chronic, mild lower extremity swelling and dyspnea on exertion.  Both have been stable.  Heart rate and blood pressure stable today.  In the setting of ongoing hypokalemia, his chlorthalidone dose was recently reduced to 12.5 mg daily.  His weight is up 1 pound since that change.  Wwith the exception of mild lower extremity swelling, he is otherwise euvolemic on examination.  He is due for follow-up labs related to hypokalemia next week.  If he remains hypokalemic, understanding that he will require some diuretic, I would recommend discontinuation of chlorthalidone and addition of spironolactone 25 mg daily.  I have also recommended that he try and keep his legs elevated when sitting, which he is not currently accustomed to doing.  He wears a TED stocking on his right lower extremity and I advised that he would likely benefit from wearing this on the left as well, in an effort to reduce diuretic needs.  Finally, we talked about his diet as he and his wife do eat out several times a week.  I encouraged him to choose low-sodium options or avoid eating out if possible.  3.  Essential hypertension: Stable on ARB, calcium channel blocker, nitrate, and diuretic therapy.  4.  Hyperlipidemia: LDL of 50 in January with normal LFTs in April.  Continue statin and Zetia therapy.  5.  Stage II-III chronic kidney  disease: Creatinine stable on lab work in April.  6.  Hypokalemia: See #2.  Chlorthalidone recently reduced with plan for follow-up labs next week.  If he remains hypokalemic, I would recommend discontinuation of chlorthalidone in addition of spironolactone.  7.  TIAs: Followed by neurology and on aspirin and Plavix.  8.  Prostate Cancer:  Followed closely by heme/onc.  9.  Disposition: He is due for follow-up lab work next week through his primary care provider's office.  Follow-up in clinic in 4 to 6 months or sooner if necessary.   Murray Hodgkins, NP 07/24/2019, 9:47 AM

## 2019-07-24 NOTE — Patient Instructions (Signed)
Medication Instructions:  Your physician recommends that you continue on your current medications as directed. Please refer to the Current Medication list given to you today.  *If you need a refill on your cardiac medications before your next appointment, please call your pharmacy*   Lab Work: None ordered  If you have labs (blood work) drawn today and your tests are completely normal, you will receive your results only by: [name] MyChart Message (if you have MyChart) OR . A paper copy in the mail If you have any lab test that is abnormal or we need to change your treatment, we will call you to review the results.   Testing/Procedures: None ordered    Follow-Up: At [facility], you and your health needs are our priority.  As part of our continuing mission to provide you with exceptional heart care, we have created designated Provider Care Teams.  These Care Teams include your primary Cardiologist (physician) and Advanced Practice Providers (APPs -  Physician Assistants and Nurse Practitioners) who all work together to provide you with the care you need, when you need it.  We recommend signing up for the patient portal called "MyChart".  Sign up information is provided on this After Visit Summary.  MyChart is used to connect with patients for Virtual Visits (Telemedicine).  Patients are able to view lab/test results, encounter notes, upcoming appointments, etc.  Non-urgent messages can be sent to your provider as well.   To learn more about what you can do with MyChart, go to [email or web address].    Your next appointment:   4-6 month(s)  The format for your next appointment:   In Person  Provider:    You may see Kathlyn Sacramento, MD or Murray Hodgkins, NP

## 2019-07-31 ENCOUNTER — Other Ambulatory Visit: Payer: Self-pay

## 2019-07-31 ENCOUNTER — Other Ambulatory Visit (INDEPENDENT_AMBULATORY_CARE_PROVIDER_SITE_OTHER): Payer: Medicare HMO

## 2019-07-31 DIAGNOSIS — E538 Deficiency of other specified B group vitamins: Secondary | ICD-10-CM

## 2019-07-31 DIAGNOSIS — E7800 Pure hypercholesterolemia, unspecified: Secondary | ICD-10-CM

## 2019-07-31 DIAGNOSIS — R739 Hyperglycemia, unspecified: Secondary | ICD-10-CM

## 2019-07-31 DIAGNOSIS — N1831 Chronic kidney disease, stage 3a: Secondary | ICD-10-CM | POA: Diagnosis not present

## 2019-07-31 LAB — HEPATIC FUNCTION PANEL
ALT: 12 U/L (ref 0–53)
AST: 18 U/L (ref 0–37)
Albumin: 4.1 g/dL (ref 3.5–5.2)
Alkaline Phosphatase: 47 U/L (ref 39–117)
Bilirubin, Direct: 0.1 mg/dL (ref 0.0–0.3)
Total Bilirubin: 0.4 mg/dL (ref 0.2–1.2)
Total Protein: 6.4 g/dL (ref 6.0–8.3)

## 2019-07-31 LAB — BASIC METABOLIC PANEL
BUN: 24 mg/dL — ABNORMAL HIGH (ref 6–23)
CO2: 28 mEq/L (ref 19–32)
Calcium: 9.5 mg/dL (ref 8.4–10.5)
Chloride: 105 mEq/L (ref 96–112)
Creatinine, Ser: 1.2 mg/dL (ref 0.40–1.50)
GFR: 58.01 mL/min — ABNORMAL LOW (ref >60.00)
Glucose, Bld: 111 mg/dL — ABNORMAL HIGH (ref 70–99)
Potassium: 4.2 mEq/L (ref 3.5–5.1)
Sodium: 139 mEq/L (ref 135–145)

## 2019-07-31 LAB — VITAMIN B12: Vitamin B-12: 455 pg/mL (ref 211–911)

## 2019-07-31 LAB — LIPID PANEL
Cholesterol: 125 mg/dL (ref 0–200)
HDL: 35.2 mg/dL — ABNORMAL LOW (ref >39.00)
LDL Cholesterol: 69 mg/dL (ref 0–99)
NonHDL: 89.63
Total CHOL/HDL Ratio: 4
Triglycerides: 101 mg/dL (ref 0.0–149.0)
VLDL: 20.2 mg/dL (ref 0.0–40.0)

## 2019-07-31 LAB — HEMOGLOBIN A1C: Hgb A1c MFr Bld: 5.9 % (ref 4.6–6.5)

## 2019-08-01 ENCOUNTER — Encounter: Payer: Self-pay | Admitting: Internal Medicine

## 2019-08-06 ENCOUNTER — Ambulatory Visit: Payer: Medicare HMO | Admitting: Internal Medicine

## 2019-08-06 ENCOUNTER — Encounter: Payer: Self-pay | Admitting: Internal Medicine

## 2019-08-06 ENCOUNTER — Other Ambulatory Visit: Payer: Self-pay

## 2019-08-06 VITALS — BP 128/72 | HR 85 | Temp 97.6°F | Resp 16 | Ht 69.0 in | Wt 213.4 lb

## 2019-08-06 DIAGNOSIS — D500 Iron deficiency anemia secondary to blood loss (chronic): Secondary | ICD-10-CM | POA: Diagnosis not present

## 2019-08-06 DIAGNOSIS — I714 Abdominal aortic aneurysm, without rupture, unspecified: Secondary | ICD-10-CM

## 2019-08-06 DIAGNOSIS — J449 Chronic obstructive pulmonary disease, unspecified: Secondary | ICD-10-CM

## 2019-08-06 DIAGNOSIS — I4891 Unspecified atrial fibrillation: Secondary | ICD-10-CM

## 2019-08-06 DIAGNOSIS — R739 Hyperglycemia, unspecified: Secondary | ICD-10-CM

## 2019-08-06 DIAGNOSIS — E7800 Pure hypercholesterolemia, unspecified: Secondary | ICD-10-CM

## 2019-08-06 DIAGNOSIS — I779 Disorder of arteries and arterioles, unspecified: Secondary | ICD-10-CM

## 2019-08-06 DIAGNOSIS — I10 Essential (primary) hypertension: Secondary | ICD-10-CM

## 2019-08-06 DIAGNOSIS — G4730 Sleep apnea, unspecified: Secondary | ICD-10-CM

## 2019-08-06 DIAGNOSIS — N1831 Chronic kidney disease, stage 3a: Secondary | ICD-10-CM

## 2019-08-06 DIAGNOSIS — D649 Anemia, unspecified: Secondary | ICD-10-CM

## 2019-08-06 DIAGNOSIS — K219 Gastro-esophageal reflux disease without esophagitis: Secondary | ICD-10-CM

## 2019-08-06 DIAGNOSIS — I724 Aneurysm of artery of lower extremity: Secondary | ICD-10-CM | POA: Diagnosis not present

## 2019-08-06 DIAGNOSIS — R0981 Nasal congestion: Secondary | ICD-10-CM

## 2019-08-06 NOTE — Progress Notes (Signed)
Patient ID: Nathaniel Thompson, male   DOB: 09-Jul-1935, 84 y.o.   MRN: [id number]   Subjective:    Patient ID: Nathaniel Thompson, male    DOB: August 28, 1935, 84 y.o.   MRN: [id number]  HPI This visit occurred during the SARS-CoV-2 public health emergency.  Safety protocols were in place, including screening questions prior to the visit, additional usage of staff PPE, and extensive cleaning of exam room while observing appropriate contact time as indicated for disinfecting solutions.  Patient here for a scheduled follow up. He is accompanied by his wife.  History obtained from both of them.   Has known CAD and HFpEF.  Just evaluated by cardiology 07/24/19.  Stable.  No changes made.  Breathing stable.  No chest pain.  Still with nasal congestion and intermittent bloody mucus.  Using saline nasal spray regularly.  Previously felt flonase made worse.  Affects using cpap.  Given persistent issues, discussed ENT referral.  No acid reflux or abdominal pain reported.  No bowel change reported.     Past Medical History:  Diagnosis Date  . (HFpEF) heart failure with preserved ejection fraction ([place])    a. 05/2018 Echo: Nl EF, Gr1 DD, sev dil LA, mild MR/TR, mild PAH; b. 05/2019 Echo: EF 55-60%, no rwma, mild LVH. Nl PASP. Mildly dil LA. Triv MR. Mild AoV sclerosis w/o stenosis. Mildly dil Asc AO (41m).  . Allergic rhinitis   . Barrett's esophagus 10/21/13  . Bone cancer ([place]   . CKD (chronic kidney disease), stage III   . Colon polyp, hyperplastic   . COPD (chronic obstructive pulmonary disease) (HCC)    mild COPD. former smoker  . Coronary artery disease    a. 2003 s/p PCI ([facility]); b. 2004 s/p PCI x 2 ([facility]); c. 11/2017 Cath: LM nl, LAD mild dzs, LCX patent stent w/ 30% distal edge restenosis, RCA patent distal stent w/ 30% prox edge stenosis. Nl EF->Med Rx.  . Diverticulosis   . Fundic gland polyps of stomach, benign   . Gastritis   . GERD (gastroesophageal reflux disease)   . Hypercholesteremia   .  Hypertension   . Prostate cancer ([place]   . Prostatism   . Reflux esophagitis   . Renal stones   . Skin cancer    left cheek/lesion excised  . Sleep apnea   . Tachycardia    a. 11/2017 admit w/ tachycardia/? afib-->no afib noted upon review (amio/OAC d/c'd).  .Marland KitchenTIA (transient ischemic attack)    Past Surgical History:  Procedure Laterality Date  . CARDIAC CATHETERIZATION    . COLONOSCOPY WITH PROPOFOL N/A 09/13/2015   Procedure: COLONOSCOPY WITH PROPOFOL;  Surgeon: MLollie Sails MD;  [facility];  Service: Endoscopy;  Laterality: N/A;  . COLONOSCOPY WITH PROPOFOL N/A 05/07/2019   Procedure: COLONOSCOPY WITH PROPOFOL;  Surgeon: BRobert Bellow MD;  Location: [facility] ENDOSCOPY;  Service: Endoscopy;  Laterality: N/A;  . CORONARY ANGIOPLASTY    . ESOPHAGOGASTRODUODENOSCOPY (EGD) WITH PROPOFOL N/A 09/13/2015   Procedure: ESOPHAGOGASTRODUODENOSCOPY (EGD) WITH PROPOFOL;  Surgeon: MLollie Sails MD;  [facility]  Service: Endoscopy;  Laterality: N/A;  . ESOPHAGOGASTRODUODENOSCOPY (EGD) WITH PROPOFOL N/A 12/28/2015   Procedure: ESOPHAGOGASTRODUODENOSCOPY (EGD) WITH PROPOFOL;  Surgeon: MLollie Sails MD;  [facility];  Service: Endoscopy;  Laterality: N/A;  . ESOPHAGOGASTRODUODENOSCOPY (EGD) WITH PROPOFOL N/A 06/16/2016   Procedure: ESOPHAGOGASTRODUODENOSCOPY (EGD) WITH PROPOFOL;  Surgeon: MLollie Sails MD;  [facility];  Service: Endoscopy;  Laterality: N/A;  .  ESOPHAGOGASTRODUODENOSCOPY (EGD) WITH PROPOFOL N/A 05/07/2019   Procedure: ESOPHAGOGASTRODUODENOSCOPY (EGD) WITH PROPOFOL;  Surgeon: Robert Bellow, MD;  Location: [facility] ENDOSCOPY;  Service: Endoscopy;  Laterality: N/A;  . EYE SURGERY  2013   CATARACT EXTRACTION  . GANGLION CYST EXCISION Right 05/18/2017   Procedure: REMOVAL GANGLION CYST ANKLE;  Surgeon: Samara Deist, DPM;  Location: [facility] ORS;  Service: Podiatry;  Laterality: Right;  . heart cath stent    . LEFT HEART  CATH AND CORONARY ANGIOGRAPHY Right 12/03/2017   Procedure: Left Heart Cath and Coronary Angiography with possible coronary intervention;  Surgeon: Dionisio David, MD;  Location: [place] [facility] LAB;  Service: Cardiovascular;  Laterality: Right;  . PROSTATE BIOPSY    . stents     multiple   Family History  Problem Relation Age of Onset  . Cancer Mother        gastric and lung  . Cancer Father        multiple myeloma  . Stroke Father   . Cancer Sister        leukemia  . Cancer Brother        leukemia  . Cancer Brother        kidney  . Cancer Daughter        Uterine  . Cancer Other        [place] (Sister's Son): Prostate   Social History   Socioeconomic History  . Marital status: Married    Spouse name: Not on file  . Number of children: Not on file  . Years of education: Not on file  . Highest education level: Not on file  Occupational History  . Not on file  Tobacco Use  . Smoking status: Former Smoker    Packs/day: 1.00    Years: 35.00    Pack years: 35.00    Types: Cigarettes    Quit date: 03/06/1986    Years since quitting: 33.4  . Smokeless tobacco: Former Systems developer    Types: Chew  Substance and Sexual Activity  . Alcohol use: No  . Drug use: No  . Sexual activity: Not Currently  Other Topics Concern  . Not on file  Social History Narrative  . Not on file   Social Determinants of Health   Financial Resource Strain:   . Difficulty of Paying Living Expenses:   Food Insecurity:   . Worried About Charity fundraiser in the Last Year:   . Arboriculturist in the Last Year:   Transportation Needs:   . Film/video editor (Medical):   [name] Lack of Transportation (Non-Medical):   Physical Activity:   . Days of Exercise per Week:   . Minutes of Exercise per Session:   Stress:   . Feeling of Stress :   Social Connections:   . Frequency of Communication with Friends and Family:   . Frequency of Social Gatherings with Friends and Family:   . Attends Religious  Services:   . Active Member of Clubs or Organizations:   . Attends Archivist Meetings:   [name] Marital Status:     Outpatient Encounter Medications as of 08/06/2019  Medication Sig  . amLODipine (NORVASC) 10 MG tablet Take 1 tablet (10 mg total) by mouth daily.  [name] aspirin EC 81 MG tablet Take 81 mg by mouth daily.  . calcium citrate-vitamin D (CITRACAL+D) 315-200 MG-UNIT tablet Take 1 tablet by mouth daily.  . chlorthalidone (HYGROTON) 25 MG tablet Take 25 mg by mouth  daily.  . cholecalciferol (VITAMIN D) 1000 units tablet Take 1,000 Units by mouth daily.   . clopidogrel (PLAVIX) 75 MG tablet Take 1 tablet (75 mg total) by mouth daily.  [name] ezetimibe (ZETIA) 10 MG tablet Take 1 tablet (10 mg total) by mouth daily.  . fexofenadine (ALLEGRA) 180 MG tablet Take 180 mg by mouth daily as needed for allergies or rhinitis.  . fluticasone (FLONASE) 50 MCG/ACT nasal spray Place 2 sprays into both nostrils daily.  [name] gabapentin (NEURONTIN) 100 MG capsule Take 100 mg by mouth at bedtime.   . isosorbide mononitrate (IMDUR) 30 MG 24 hr tablet TAKE 1 TABLET BY MOUTH ONCE DAILY  . losartan (COZAAR) 50 MG tablet Take 1 tablet (50 mg total) by mouth daily.  [name] lovastatin (MEVACOR) 40 MG tablet TAKE 2 TABLETS BY MOUTH AT BEDTIME  . Multiple Vitamin (MULTI-VITAMINS) TABS Take 1 tablet by mouth daily.   . nitroGLYCERIN (NITROSTAT) 0.4 MG SL tablet Place 0.4 mg under the tongue every 5 (five) minutes as needed for chest pain.  . pantoprazole (PROTONIX) 40 MG tablet Take 40 mg by mouth 2 (two) times daily.   [name] POLYETHYLENE GLYCOL 3350 PO Take by mouth.  . potassium chloride SA (KLOR-CON) 20 MEQ tablet TAKE 1 TABLET BY MOUTH ONCE DAILY   No facility-administered encounter medications on file as of 08/06/2019.    Review of Systems  Constitutional: Negative for appetite change and unexpected weight change.  HENT: Positive for congestion. Negative for sinus pressure.   Respiratory: Negative for cough and chest  tightness.        Breathing stable.    Cardiovascular: Negative for chest pain, palpitations and leg swelling.  Gastrointestinal: Negative for abdominal pain, diarrhea, nausea and vomiting.  Genitourinary: Negative for difficulty urinating and dysuria.  Musculoskeletal: Negative for joint swelling and myalgias.  Skin: Negative for color change and rash.  Neurological: Negative for dizziness, light-headedness and headaches.  Psychiatric/Behavioral: Negative for agitation and dysphoric mood.       Objective:    Physical Exam Vitals reviewed.  Constitutional:      General: He is not in acute distress.    Appearance: Normal appearance. He is well-developed.  HENT:     Head: Normocephalic and atraumatic.     Right Ear: External ear normal.     Left Ear: External ear normal.  Eyes:     General:        Right eye: No discharge.        Left eye: No discharge.     Conjunctiva/sclera: Conjunctivae normal.  Cardiovascular:     Rate and Rhythm: Normal rate and regular rhythm.  Pulmonary:     Effort: Pulmonary effort is normal. No respiratory distress.     Breath sounds: Normal breath sounds.  Abdominal:     General: Bowel sounds are normal.     Palpations: Abdomen is soft.     Tenderness: There is no abdominal tenderness.  Musculoskeletal:        General: No swelling or tenderness.     Cervical back: Neck supple. No tenderness.  Lymphadenopathy:     Cervical: No cervical adenopathy.  Skin:    Findings: No erythema or rash.  Neurological:     Mental Status: He is alert.  Psychiatric:        Mood and Affect: Mood normal.        Behavior: Behavior normal.     BP 128/72   Pulse 85   Temp 97.6 F (  36.4 C)   Resp 16   Ht '5\' 9"'  (1.753 m)   Wt 213 lb 6.4 oz (96.8 kg)   SpO2 99%   BMI 31.51 kg/m  Wt Readings from Last 3 Encounters:  08/06/19 213 lb 6.4 oz (96.8 kg)  07/24/19 214 lb 4 oz (97.2 kg)  07/16/19 213 lb (96.6 kg)     Lab Results  Component Value Date   WBC  5.9 07/01/2019   HGB 12.0 (L) 07/01/2019   HCT 34.5 (L) 07/01/2019   PLT 208 07/01/2019   GLUCOSE 111 (H) 07/31/2019   CHOL 125 07/31/2019   TRIG 101.0 07/31/2019   HDL 35.20 (L) 07/31/2019   LDLCALC 69 07/31/2019   ALT 12 07/31/2019   AST 18 07/31/2019   NA 139 07/31/2019   K 4.2 07/31/2019   CL 105 07/31/2019   CREATININE 1.20 07/31/2019   BUN 24 (H) 07/31/2019   CO2 28 07/31/2019   TSH 1.82 03/31/2019   PSA 5.47 (H) 08/03/2016   HGBA1C 5.9 07/31/2019       Assessment & Plan:   Problem List Items Addressed This Visit    Abdominal aortic aneurysm (AAA) ([place])    Evaluated by [facility] 01/2019.  Recommended f/u in 6 months.  Need to schedule.        Anemia    Being followed by hematology.  Follow cbc.        Atrial fibrillation, new onset ([place])    Rate controlled.  Seeing cardiology.  Stable.       Carotid artery disease ([place])    Recent f/u <70%.  Stable.  Recommended f/u in 6 months.  Continue plavix and lovastatin.        CKD (chronic kidney disease)    Continue to avoid antiinflammatories.  Follow metabolic panel.        COPD (chronic obstructive pulmonary disease) (HCC)    Followed by pulmonary.  Breathing stable.        GERD without esophagitis    Controlled on protonix.  Follow.        Hypercholesterolemia    On lovastatin.  Low cholesterol diet and exercise.  Follow lipid panel and liver function tests.        Relevant Orders   Hepatic function panel   Lipid panel   Hyperglycemia    Low carb diet and exercise.  Follow met b and a1c.       Relevant Orders   Hemoglobin A1c   Hypertension, essential    Blood pressure doing well.  On chlorthalidone 1/2 tablet.  Potassium wnl.  Blood pressure doing well.  Continue current medication regimen - amlodipine and losartan.  Follow pressures.  Follow metabolic panel.       Relevant Orders   Basic metabolic panel   Iron deficiency anemia due to chronic blood loss    Colonoscopy 05/2019 as outlined.  Follow  cbc and iron studies.        Nasal congestion - Primary    Continue saline nasal spray.  Persistent nasal congestion and bloody mucus.  Affecting cpap.  Refer to ENT for evaluation.        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  Last evaluated 01/2019.  Stable.  Recommended annual f/u.        Sleep apnea    Using cpap.  Uses regularly.  Continue saline nasal spray.  ent referral given persistent congestion  as outlined.            Einar Pheasant, MD

## 2019-08-10 ENCOUNTER — Encounter: Payer: Self-pay | Admitting: Internal Medicine

## 2019-08-10 NOTE — Assessment & Plan Note (Signed)
Recent f/u <70%.  Stable.  Recommended f/u in 6 months.  Continue plavix and lovastatin.

## 2019-08-10 NOTE — Assessment & Plan Note (Signed)
Colonoscopy 05/2019 as outlined.  Follow cbc and iron studies.

## 2019-08-10 NOTE — Assessment & Plan Note (Signed)
Continue saline nasal spray.  Persistent nasal congestion and bloody mucus.  Affecting cpap.  Refer to ENT for evaluation.

## 2019-08-10 NOTE — Assessment & Plan Note (Signed)
Evaluated by [facility] 01/2019.  Recommended f/u in 6 months.  Need to schedule.

## 2019-08-10 NOTE — Assessment & Plan Note (Signed)
Low carb diet and exercise.  Follow met b and a1c.  

## 2019-08-10 NOTE — Assessment & Plan Note (Signed)
Being followed by hematology.  Follow cbc.  

## 2019-08-10 NOTE — Assessment & Plan Note (Signed)
Continue to avoid antiinflammatories.  Follow metabolic panel.  

## 2019-08-10 NOTE — Assessment & Plan Note (Signed)
Using cpap.  Uses regularly.  Continue saline nasal spray.  ent referral given persistent congestion as outlined.

## 2019-08-10 NOTE — Assessment & Plan Note (Signed)
Evaluated by [facility].  Last evaluated 01/2019.  Stable.  Recommended annual f/u.  

## 2019-08-10 NOTE — Assessment & Plan Note (Signed)
Followed by pulmonary.  Breathing stable.   

## 2019-08-10 NOTE — Assessment & Plan Note (Signed)
On lovastatin.  Low cholesterol diet and exercise.  Follow lipid panel and liver function tests.   

## 2019-08-10 NOTE — Assessment & Plan Note (Signed)
Blood pressure doing well.  On chlorthalidone 1/2 tablet.  Potassium wnl.  Blood pressure doing well.  Continue current medication regimen - amlodipine and losartan.  Follow pressures.  Follow metabolic panel.

## 2019-08-10 NOTE — Assessment & Plan Note (Signed)
Rate controlled.  Seeing cardiology.  Stable.

## 2019-08-10 NOTE — Assessment & Plan Note (Signed)
Controlled on protonix.  Follow.   

## 2019-08-16 ENCOUNTER — Other Ambulatory Visit: Payer: Self-pay | Admitting: Internal Medicine

## 2019-09-23 ENCOUNTER — Other Ambulatory Visit: Payer: Self-pay | Admitting: Internal Medicine

## 2019-09-23 NOTE — Telephone Encounter (Signed)
   Ref Range & Units 1 mo ago 2 mo ago  Potassium 3.5 - 5.1 mEq/L 4.2  3.3Low R

## 2019-09-30 ENCOUNTER — Other Ambulatory Visit: Payer: Medicare HMO

## 2019-09-30 ENCOUNTER — Ambulatory Visit: Payer: Medicare HMO

## 2019-09-30 ENCOUNTER — Ambulatory Visit: Payer: Medicare HMO | Admitting: Internal Medicine

## 2019-10-07 ENCOUNTER — Other Ambulatory Visit: Payer: Self-pay

## 2019-10-07 ENCOUNTER — Inpatient Hospital Stay: Payer: Medicare HMO

## 2019-10-07 ENCOUNTER — Inpatient Hospital Stay: Payer: Medicare HMO | Admitting: Internal Medicine

## 2019-10-07 ENCOUNTER — Inpatient Hospital Stay: Payer: Medicare HMO | Attending: Internal Medicine

## 2019-10-07 ENCOUNTER — Encounter: Payer: Self-pay | Admitting: Internal Medicine

## 2019-10-07 DIAGNOSIS — Z5111 Encounter for antineoplastic chemotherapy: Secondary | ICD-10-CM | POA: Diagnosis not present

## 2019-10-07 DIAGNOSIS — C7951 Secondary malignant neoplasm of bone: Secondary | ICD-10-CM | POA: Insufficient documentation

## 2019-10-07 DIAGNOSIS — C61 Malignant neoplasm of prostate: Secondary | ICD-10-CM

## 2019-10-07 LAB — COMPREHENSIVE METABOLIC PANEL
ALT: 18 U/L (ref 0–44)
AST: 26 U/L (ref 15–41)
Albumin: 4 g/dL (ref 3.5–5.0)
Alkaline Phosphatase: 46 U/L (ref 38–126)
Anion gap: 9 (ref 5–15)
BUN: 20 mg/dL (ref 8–23)
CO2: 25 mmol/L (ref 22–32)
Calcium: 8.9 mg/dL (ref 8.9–10.3)
Chloride: 104 mmol/L (ref 98–111)
Creatinine, Ser: 1.31 mg/dL — ABNORMAL HIGH (ref 0.61–1.24)
GFR calc Af Amer: 59 mL/min — ABNORMAL LOW (ref >60)
GFR calc non Af Amer: 51 mL/min — ABNORMAL LOW (ref >60)
Glucose, Bld: 121 mg/dL — ABNORMAL HIGH (ref 70–99)
Potassium: 3.3 mmol/L — ABNORMAL LOW (ref 3.5–5.1)
Sodium: 138 mmol/L (ref 135–145)
Total Bilirubin: 0.8 mg/dL (ref 0.3–1.2)
Total Protein: 6.9 g/dL (ref 6.5–8.1)

## 2019-10-07 LAB — CBC WITH DIFFERENTIAL/PLATELET
Abs Immature Granulocytes: 0.01 10*3/uL (ref 0.00–0.07)
Basophils Absolute: 0 10*3/uL (ref 0.0–0.1)
Basophils Relative: 0 %
Eosinophils Absolute: 0.2 10*3/uL (ref 0.0–0.5)
Eosinophils Relative: 4 %
HCT: 35.2 % — ABNORMAL LOW (ref 39.0–52.0)
Hemoglobin: 12.5 g/dL — ABNORMAL LOW (ref 13.0–17.0)
Immature Granulocytes: 0 %
Lymphocytes Relative: 31 %
Lymphs Abs: 1.4 10*3/uL (ref 0.7–4.0)
MCH: 30.9 pg (ref 26.0–34.0)
MCHC: 35.5 g/dL (ref 30.0–36.0)
MCV: 86.9 fL (ref 80.0–100.0)
Monocytes Absolute: 0.5 10*3/uL (ref 0.1–1.0)
Monocytes Relative: 12 %
Neutro Abs: 2.4 10*3/uL (ref 1.7–7.7)
Neutrophils Relative %: 53 %
Platelets: 200 10*3/uL (ref 150–400)
RBC: 4.05 MIL/uL — ABNORMAL LOW (ref 4.22–5.81)
RDW: 12.8 % (ref 11.5–15.5)
WBC: 4.5 10*3/uL (ref 4.0–10.5)
nRBC: 0 % (ref 0.0–0.2)

## 2019-10-07 LAB — PSA: Prostatic Specific Antigen: <0.01 ng/mL (ref 0.00–4.00)

## 2019-10-07 MED ORDER — LEUPROLIDE ACETATE (3 MONTH) 22.5 MG ~~LOC~~ KIT
22.5000 mg | PACK | Freq: Once | SUBCUTANEOUS | Status: AC
  Administered 2019-10-07: 22.5 mg via SUBCUTANEOUS
  Filled 2019-10-07: qty 22.5

## 2019-10-07 NOTE — Progress Notes (Signed)
[place] OFFICE PROGRESS NOTE  Patient Care Team: Einar Pheasant, MD as PCP - General (Internal Medicine) [name], MD as PCP - Cardiology (Cardiology) [name], MD as Consulting Physician (Cardiology)  Cancer Staging No matching staging information was found for the patient.   Oncology History Overview Note  Nov 2017- completed IM RT radiation therapy to his prostate and pelvic nodes for Gleason 7 (4+3) adenocarcinoma the prostate presenting the PSA of 7.8.  # JAN 2019- METASTATIC PROSTATE CA to Bone [low volume met on bone scan; CT- NED]; PSA ~50; March 25th 2019- Lupron 45 mg IM [Urology q 49M]; June 26, 2018-Eligard every 3 months[intolerance to Lupron/question A. Fib/injection site pain  # May 23rd Zytiga '250mg'$ /day; no prednisone; STOPPED MARCH 17th 2021-repeated severe hypokalemia;   # Barretts- EGD/ colo [place] 2021; Dr.Byrnett].   # [name], [place]]; Oct 2019-paroxysmal A.fib/not on eliquis; [Dr.Khan] -Dr.Arida   ------------------------------------------------------------------  DIAGNOSIS: [ JAN 2019]- Met- PROSTATE CANCER  STAGE:  IV       ;GOALS: PALLIATIVE  CURRENT/MOST RECENT THERAPY [ march 2019]- Lupron; May 23rd 2019- Zytiga '250mg'$ /day; discontinued-March 2021.     Malignant neoplasm of prostate [facility])     INTERVAL HISTORY:  Nathaniel Thompson 84 y.o.  male pleasant patient above history of metastatic prostate cancer on ADT/Zytiga is here for follow-up.  Patient's Fabio Asa was discontinued in March because of poor tolerance/severe hypokalemia severe fatigue.  Patient today is doing well.  No blood in stools or black or stools.  No worsening constipation.  Patient is on p.o. iron.  Review of Systems  Constitutional: Negative for chills, diaphoresis, fever, malaise/fatigue and weight loss.  HENT: Negative for nosebleeds and sore throat.   Eyes: Negative for double vision.  Respiratory: Negative for cough, hemoptysis,  sputum production, shortness of breath and wheezing.   Cardiovascular: Negative for chest pain, palpitations, orthopnea and leg swelling.  Gastrointestinal: Negative for abdominal pain, blood in stool, constipation, diarrhea, heartburn, melena, nausea and vomiting.  Genitourinary: Negative for dysuria, frequency and urgency.  Musculoskeletal: Positive for myalgias. Negative for back pain and joint pain.  Skin: Negative.  Negative for itching and rash.  Neurological: Negative for tingling, focal weakness, weakness and headaches.  Endo/Heme/Allergies: Does not bruise/bleed easily.  Psychiatric/Behavioral: Negative for depression. The patient is not nervous/anxious and does not have insomnia.     PAST MEDICAL HISTORY :  Past Medical History:  Diagnosis Date  . (HFpEF) heart failure with preserved ejection fraction ([place])    a. 05/2018 Echo: Nl EF, Gr1 DD, sev dil LA, mild MR/TR, mild PAH; b. 05/2019 Echo: EF 55-60%, no rwma, mild LVH. Nl PASP. Mildly dil LA. Triv MR. Mild AoV sclerosis w/o stenosis. Mildly dil Asc AO (56m).  . Allergic rhinitis   . Barrett's esophagus 10/21/13  . [place] cancer [place]   . CKD (chronic kidney disease), stage III   . Colon polyp, hyperplastic   . COPD (chronic obstructive pulmonary disease) (HCC)    mild COPD. former smoker  . Coronary artery disease    a. 2003 s/p PCI ([facility]); b. 2004 s/p PCI x 2 ([facility]); c. 11/2017 Cath: LM nl, LAD mild dzs, LCX patent stent w/ 30% distal edge restenosis, RCA patent distal stent w/ 30% prox edge stenosis. Nl EF->Med Rx.  . Diverticulosis   . Fundic gland polyps of stomach, benign   . Gastritis   . GERD (gastroesophageal reflux disease)   . Hypercholesteremia   . Hypertension   .  Prostate cancer ([place])   . Prostatism   . Reflux esophagitis   . Renal stones   . Skin cancer    left cheek/lesion excised  . Sleep apnea   . Tachycardia    a. 11/2017 admit w/ tachycardia/? afib-->no afib noted upon review (amio/OAC d/c'd).  [name] TIA  (transient ischemic attack)     PAST SURGICAL HISTORY :   Past Surgical History:  Procedure Laterality Date  . CARDIAC CATHETERIZATION    . COLONOSCOPY WITH PROPOFOL N/A 09/13/2015   Procedure: COLONOSCOPY WITH PROPOFOL;  Surgeon: Lollie Sails, MD;  Location: [facility] ENDOSCOPY;  Service: Endoscopy;  Laterality: N/A;  . COLONOSCOPY WITH PROPOFOL N/A 05/07/2019   Procedure: COLONOSCOPY WITH PROPOFOL;  Surgeon: Robert Bellow, MD;  Location: [facility] ENDOSCOPY;  Service: Endoscopy;  Laterality: N/A;  . CORONARY ANGIOPLASTY    . ESOPHAGOGASTRODUODENOSCOPY (EGD) WITH PROPOFOL N/A 09/13/2015   Procedure: ESOPHAGOGASTRODUODENOSCOPY (EGD) WITH PROPOFOL;  Surgeon: Lollie Sails, MD;  Location: [facility] ENDOSCOPY;  Service: Endoscopy;  Laterality: N/A;  . ESOPHAGOGASTRODUODENOSCOPY (EGD) WITH PROPOFOL N/A 12/28/2015   Procedure: ESOPHAGOGASTRODUODENOSCOPY (EGD) WITH PROPOFOL;  Surgeon: Lollie Sails, MD;  Location: [facility] ENDOSCOPY;  Service: Endoscopy;  Laterality: N/A;  . ESOPHAGOGASTRODUODENOSCOPY (EGD) WITH PROPOFOL N/A 06/16/2016   Procedure: ESOPHAGOGASTRODUODENOSCOPY (EGD) WITH PROPOFOL;  Surgeon: Lollie Sails, MD;  Location[facility] ENDOSCOPY;  Service: Endoscopy;  Laterality: N/A;  . ESOPHAGOGASTRODUODENOSCOPY (EGD) WITH PROPOFOL N/A 05/07/2019   Procedure: ESOPHAGOGASTRODUODENOSCOPY (EGD) WITH PROPOFOL;  Surgeon: Robert Bellow, MD;  Location: [facility] ENDOSCOPY;  Service: Endoscopy;  Laterality: N/A;  . EYE SURGERY  2013   CATARACT EXTRACTION  . GANGLION CYST EXCISION Right 05/18/2017   Procedure: REMOVAL GANGLION CYST ANKLE;  Surgeon: Samara Deist, DPM;  Location: [facility] ORS;  Service: Podiatry;  Laterality: Right;  . heart cath stent    . LEFT HEART CATH AND CORONARY ANGIOGRAPHY Right 12/03/2017   Procedure: Left Heart Cath and Coronary Angiography with possible coronary intervention;  Surgeon: Dionisio David, MD;  Location: [place] CV LAB;  Service: Cardiovascular;  Laterality:  Right;  . PROSTATE BIOPSY    . stents     multiple    FAMILY HISTORY :   Family History  Problem Relation Age of Onset  . Cancer Mother        gastric and lung  . Cancer Father        multiple myeloma  . Stroke Father   . Cancer Sister        leukemia  . Cancer Brother        leukemia  . Cancer Brother        kidney  . Cancer Daughter        Uterine  . Cancer Other        [place] (Sister's Son): Prostate    SOCIAL HISTORY:   Social History   Tobacco Use  . Smoking status: Former Smoker    Packs/day: 1.00    Years: 35.00    Pack years: 35.00    Types: Cigarettes    Quit date: 03/06/1986    Years since quitting: 33.6  . Smokeless tobacco: Former Systems developer    Types: Secondary school teacher  . Vaping Use: Never used  Substance Use Topics  . Alcohol use: No  . Drug use: No    ALLERGIES:  is allergic to atorvastatin.  MEDICATIONS:  Current Outpatient Medications  Medication Sig Dispense Refill  . amLODipine (NORVASC) 10 MG tablet Take 1 tablet (10  mg total) by mouth daily. 90 tablet 1  . aspirin EC 81 MG tablet Take 81 mg by mouth daily.    . calcium citrate-vitamin D (CITRACAL+D) 315-200 MG-UNIT tablet Take 1 tablet by mouth daily.    . chlorthalidone (HYGROTON) 25 MG tablet Take 25 mg by mouth daily.    . cholecalciferol (VITAMIN D) 1000 units tablet Take 1,000 Units by mouth daily.     . clopidogrel (PLAVIX) 75 MG tablet Take 1 tablet (75 mg total) by mouth daily. 90 tablet 3  . ezetimibe (ZETIA) 10 MG tablet Take 1 tablet (10 mg total) by mouth daily. 90 tablet 2  . isosorbide mononitrate (IMDUR) 30 MG 24 hr tablet TAKE 1 TABLET BY MOUTH ONCE DAILY 90 tablet 2  . losartan (COZAAR) 50 MG tablet Take 1 tablet (50 mg total) by mouth daily. 90 tablet 1  . lovastatin (MEVACOR) 40 MG tablet TAKE 2 TABLETS BY MOUTH AT BEDTIME 180 tablet 1  . Multiple Vitamin (MULTI-VITAMINS) TABS Take 1 tablet by mouth daily.     . pantoprazole (PROTONIX) 40 MG tablet Take 40 mg by mouth 2 (two)  times daily.     . potassium chloride SA (KLOR-CON) 20 MEQ tablet TAKE 1 TABLET BY MOUTH ONCE DAILY 30 tablet 0  . fexofenadine (ALLEGRA) 180 MG tablet Take 180 mg by mouth daily as needed for allergies or rhinitis. (Patient not taking: Reported on 10/07/2019)    . fluticasone (FLONASE) 50 MCG/ACT nasal spray Place 2 sprays into both nostrils daily. (Patient not taking: Reported on 10/07/2019)    . gabapentin (NEURONTIN) 100 MG capsule Take 100 mg by mouth at bedtime.  (Patient not taking: Reported on 10/07/2019)    . nitroGLYCERIN (NITROSTAT) 0.4 MG SL tablet Place 0.4 mg under the tongue every 5 (five) minutes as needed for chest pain. (Patient not taking: Reported on 10/07/2019)    . POLYETHYLENE GLYCOL 3350 PO Take by mouth. (Patient not taking: Reported on 10/07/2019)     No current facility-administered medications for this visit.    PHYSICAL EXAMINATION: ECOG PERFORMANCE STATUS: 0 - Asymptomatic  BP 133/76 (BP Location: Right Arm, Patient Position: Sitting, Cuff Size: Large)   Pulse 74   Temp 97.8 F (36.6 C) (Tympanic)   Resp 18   Ht '5\' 9"'$  (1.753 m)   Wt 214 lb (97.1 kg)   SpO2 100%   BMI 31.60 kg/m   Filed Weights   10/07/19 1331  Weight: 214 lb (97.1 kg)    Physical Exam Constitutional:      Comments: Patient is with his wife.  He is walking independently.  HENT:     Head: Normocephalic and atraumatic.     Mouth/Throat:     Pharynx: No oropharyngeal exudate.  Eyes:     Pupils: Pupils are equal, round, and reactive to light.  Cardiovascular:     Rate and Rhythm: Normal rate and regular rhythm.  Pulmonary:     Effort: No respiratory distress.     Breath sounds: No wheezing.  Abdominal:     General: Bowel sounds are normal. There is no distension.     Palpations: Abdomen is soft. There is no mass.     Tenderness: There is no abdominal tenderness. There is no guarding or rebound.  Musculoskeletal:        General: No tenderness. Normal range of motion.     Cervical back:  Normal range of motion and neck supple.  Skin:    General: Skin is  warm.  Neurological:     Mental Status: He is alert and oriented to person, place, and time.  Psychiatric:        Mood and Affect: Affect normal.     LABORATORY DATA:  I have reviewed the data as listed    Component Value Date/Time   NA 138 10/07/2019 1305   NA 140 09/16/2013 0438   K 3.3 (L) 10/07/2019 1305   K 4.2 09/16/2013 0438   CL 104 10/07/2019 1305   CL 109 (H) 09/16/2013 0438   CO2 25 10/07/2019 1305   CO2 24 09/16/2013 0438   GLUCOSE 121 (H) 10/07/2019 1305   GLUCOSE 101 (H) 09/16/2013 0438   BUN 20 10/07/2019 1305   BUN 20 (H) 09/16/2013 0438   CREATININE 1.31 (H) 10/07/2019 1305   CREATININE 1.28 09/16/2013 0438   CALCIUM 8.9 10/07/2019 1305   CALCIUM 8.3 (L) 09/16/2013 0438   PROT 6.9 10/07/2019 1305   PROT 6.7 09/15/2013 0755   ALBUMIN 4.0 10/07/2019 1305   ALBUMIN 3.5 09/15/2013 0755   AST 26 10/07/2019 1305   AST 37 09/15/2013 0755   ALT 18 10/07/2019 1305   ALT 28 09/15/2013 0755   ALKPHOS 46 10/07/2019 1305   ALKPHOS 44 (L) 09/15/2013 0755   BILITOT 0.8 10/07/2019 1305   BILITOT 0.5 09/15/2013 0755   GFRNONAA 51 (L) 10/07/2019 1305   GFRNONAA 54 (L) 09/16/2013 0438   GFRAA 59 (L) 10/07/2019 1305   GFRAA >60 09/16/2013 0438    No results found for: SPEP, UPEP  Lab Results  Component Value Date   WBC 4.5 10/07/2019   NEUTROABS 2.4 10/07/2019   HGB 12.5 (L) 10/07/2019   HCT 35.2 (L) 10/07/2019   MCV 86.9 10/07/2019   PLT 200 10/07/2019      Chemistry      Component Value Date/Time   NA 138 10/07/2019 1305   NA 140 09/16/2013 0438   K 3.3 (L) 10/07/2019 1305   K 4.2 09/16/2013 0438   CL 104 10/07/2019 1305   CL 109 (H) 09/16/2013 0438   CO2 25 10/07/2019 1305   CO2 24 09/16/2013 0438   BUN 20 10/07/2019 1305   BUN 20 (H) 09/16/2013 0438   CREATININE 1.31 (H) 10/07/2019 1305   CREATININE 1.28 09/16/2013 0438      Component Value Date/Time   CALCIUM 8.9  10/07/2019 1305   CALCIUM 8.3 (L) 09/16/2013 0438   ALKPHOS 46 10/07/2019 1305   ALKPHOS 44 (L) 09/15/2013 0755   AST 26 10/07/2019 1305   AST 37 09/15/2013 0755   ALT 18 10/07/2019 1305   ALT 28 09/15/2013 0755   BILITOT 0.8 10/07/2019 1305   BILITOT 0.5 09/15/2013 0755       RADIOGRAPHIC STUDIES: I have personally reviewed the radiological images as listed and agreed with the findings in the report. No results found.   ASSESSMENT & PLAN:  Malignant neoplasm of prostate ([place]) # Castrate sensitive-metastatic prostate cancer to the bone. Stage IV-on Eligard; Bone scan October 2020-stable positive response. MAY 2021- PSA- <0.01.  STABLE.   #Currently off Zytiga secondary to poor tolerance.  Await PSA from today; if rising could consider alternative therapy.   #Iron deficient anemia/CKD- III-[s/p EGD March 2021]-continue p.o. iron.  Hold Venofer.  Hemoglobin 12 stable.  # A. fib paroxysmal-sinus rhythm; aspirin Plavix-stable.  # DISPOSITION: Call PSA # Eligard today; HOLD Venofer #   Follow up in 3 months 2021- labs-cbc/cmp/PSA; eligard; possible venofer- Dr.B  Cc; Dr.Scott/Dr.Arida.  No orders of the defined types were placed in this encounter.  All questions were answered. The patient knows to call the clinic with any problems, questions or concerns.      Cammie Sickle, MD 10/07/2019 2:31 PM

## 2019-10-07 NOTE — Assessment & Plan Note (Addendum)
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 Stage IV-on Eligard; Bone scan October 2020-stable positive response. MAY 2021- PSA- <0.01.  STABLE.   #Currently off Zytiga secondary to poor tolerance.  Await PSA from today; if rising could consider alternative therapy.   #Iron deficient anemia/CKD- III-[s/p EGD March 2021]-continue p.o. iron.  Hold Venofer.  Hemoglobin 12 stable.  # A. fib paroxysmal-sinus rhythm; aspirin Plavix-stable.  # DISPOSITION: Call PSA # Eligard today; HOLD Venofer #   Follow up in 3 months 2021- labs-cbc/cmp/PSA; eligard; possible venofer- Dr.B  Cc; Dr.Scott/Dr.Arida.

## 2019-10-08 ENCOUNTER — Telehealth: Payer: Self-pay | Admitting: Internal Medicine

## 2019-10-08 NOTE — Telephone Encounter (Signed)
Spoke to patient regarding results of the PSA-less than 0.01.  Continue current therapy/surveillance.

## 2019-10-25 ENCOUNTER — Other Ambulatory Visit: Payer: Self-pay | Admitting: Internal Medicine

## 2019-11-06 ENCOUNTER — Other Ambulatory Visit: Payer: Self-pay

## 2019-11-06 ENCOUNTER — Ambulatory Visit (INDEPENDENT_AMBULATORY_CARE_PROVIDER_SITE_OTHER): Payer: Medicare HMO

## 2019-11-06 DIAGNOSIS — Z23 Encounter for immunization: Secondary | ICD-10-CM | POA: Diagnosis not present

## 2019-11-06 MED ORDER — NITROGLYCERIN 0.4 MG SL SUBL: 0.4000 mg | SUBLINGUAL_TABLET | SUBLINGUAL | 0 refills | Status: DC | PRN

## 2019-11-19 ENCOUNTER — Other Ambulatory Visit: Payer: Self-pay | Admitting: Internal Medicine

## 2019-11-19 NOTE — Telephone Encounter (Signed)
The do not make a 12.5mg  tablet.  Ok to refill 1/2 (25mg  tablet) as he has been taking.  Will need rx sent in if ok with this.

## 2019-11-19 NOTE — Telephone Encounter (Signed)
Pt needs a refill on chlorthalidone (HYGROTON) 25 MG tablet -wife states that PCP changed the rx to 12.5 and would like to get that strength called in so he would not have to cut up his pills again. He is out of medication

## 2019-11-19 NOTE — Telephone Encounter (Signed)
Ok to refill as 12.5mg ?

## 2019-11-20 MED ORDER — CHLORTHALIDONE 25 MG PO TABS: 12.5000 mg | ORAL_TABLET | Freq: Every day | ORAL | 1 refills | Status: DC

## 2019-11-20 NOTE — Telephone Encounter (Signed)
Called and spoke to [facility]. Holley Raring states that she spoke to the Pharmacist at [facility] and that they will try and cut the medication for them when we send in a refill. She verbalized her understanding that they do not make the 12.5 mg tabs. Ok to refill as 25mg  or should the prescription be changed to 12.5?

## 2019-11-20 NOTE — Telephone Encounter (Signed)
Spoke with Dr. Nicki Reaper in office. Dr. Nicki Reaper gave verbal ok to correct prescription to say take half of the 25mg  Tab. Prescription rewritten to take .5 tablets (12.5 mg total) daily and prescription was sent to [facility].

## 2019-12-08 ENCOUNTER — Other Ambulatory Visit: Payer: Self-pay

## 2019-12-08 ENCOUNTER — Other Ambulatory Visit (INDEPENDENT_AMBULATORY_CARE_PROVIDER_SITE_OTHER): Payer: Medicare HMO

## 2019-12-08 DIAGNOSIS — R739 Hyperglycemia, unspecified: Secondary | ICD-10-CM

## 2019-12-08 DIAGNOSIS — E7800 Pure hypercholesterolemia, unspecified: Secondary | ICD-10-CM

## 2019-12-08 DIAGNOSIS — I10 Essential (primary) hypertension: Secondary | ICD-10-CM

## 2019-12-08 LAB — HEPATIC FUNCTION PANEL
ALT: 13 U/L (ref 0–53)
AST: 18 U/L (ref 0–37)
Albumin: 3.9 g/dL (ref 3.5–5.2)
Alkaline Phosphatase: 42 U/L (ref 39–117)
Bilirubin, Direct: 0.1 mg/dL (ref 0.0–0.3)
Total Bilirubin: 0.5 mg/dL (ref 0.2–1.2)
Total Protein: 6.5 g/dL (ref 6.0–8.3)

## 2019-12-08 LAB — BASIC METABOLIC PANEL
BUN: 28 mg/dL — ABNORMAL HIGH (ref 6–23)
CO2: 26 mEq/L (ref 19–32)
Calcium: 9.3 mg/dL (ref 8.4–10.5)
Chloride: 104 mEq/L (ref 96–112)
Creatinine, Ser: 1.49 mg/dL (ref 0.40–1.50)
GFR: 45.15 mL/min — ABNORMAL LOW (ref >60.00)
Glucose, Bld: 112 mg/dL — ABNORMAL HIGH (ref 70–99)
Potassium: 3.5 mEq/L (ref 3.5–5.1)
Sodium: 140 mEq/L (ref 135–145)

## 2019-12-08 LAB — LIPID PANEL
Cholesterol: 117 mg/dL (ref 0–200)
HDL: 33.6 mg/dL — ABNORMAL LOW (ref >39.00)
LDL Cholesterol: 61 mg/dL (ref 0–99)
NonHDL: 83.48
Total CHOL/HDL Ratio: 3
Triglycerides: 111 mg/dL (ref 0.0–149.0)
VLDL: 22.2 mg/dL (ref 0.0–40.0)

## 2019-12-08 LAB — HEMOGLOBIN A1C: Hgb A1c MFr Bld: 5.9 % (ref 4.6–6.5)

## 2019-12-10 ENCOUNTER — Ambulatory Visit: Payer: Medicare HMO | Admitting: Internal Medicine

## 2019-12-10 ENCOUNTER — Encounter: Payer: Self-pay | Admitting: Internal Medicine

## 2019-12-10 ENCOUNTER — Other Ambulatory Visit: Payer: Self-pay

## 2019-12-10 VITALS — BP 128/70 | HR 76 | Temp 97.7°F | Resp 16 | Ht 69.0 in | Wt 218.0 lb

## 2019-12-10 DIAGNOSIS — R0602 Shortness of breath: Secondary | ICD-10-CM | POA: Diagnosis not present

## 2019-12-10 DIAGNOSIS — I2510 Atherosclerotic heart disease of native coronary artery without angina pectoris: Secondary | ICD-10-CM

## 2019-12-10 DIAGNOSIS — G4730 Sleep apnea, unspecified: Secondary | ICD-10-CM

## 2019-12-10 DIAGNOSIS — I10 Essential (primary) hypertension: Secondary | ICD-10-CM

## 2019-12-10 DIAGNOSIS — R739 Hyperglycemia, unspecified: Secondary | ICD-10-CM

## 2019-12-10 DIAGNOSIS — D500 Iron deficiency anemia secondary to blood loss (chronic): Secondary | ICD-10-CM

## 2019-12-10 DIAGNOSIS — I4891 Unspecified atrial fibrillation: Secondary | ICD-10-CM

## 2019-12-10 DIAGNOSIS — I714 Abdominal aortic aneurysm, without rupture, unspecified: Secondary | ICD-10-CM

## 2019-12-10 DIAGNOSIS — R944 Abnormal results of kidney function studies: Secondary | ICD-10-CM | POA: Diagnosis not present

## 2019-12-10 DIAGNOSIS — K219 Gastro-esophageal reflux disease without esophagitis: Secondary | ICD-10-CM

## 2019-12-10 DIAGNOSIS — I724 Aneurysm of artery of lower extremity: Secondary | ICD-10-CM | POA: Diagnosis not present

## 2019-12-10 DIAGNOSIS — E7800 Pure hypercholesterolemia, unspecified: Secondary | ICD-10-CM

## 2019-12-10 DIAGNOSIS — I779 Disorder of arteries and arterioles, unspecified: Secondary | ICD-10-CM

## 2019-12-10 DIAGNOSIS — D649 Anemia, unspecified: Secondary | ICD-10-CM

## 2019-12-10 DIAGNOSIS — N1831 Chronic kidney disease, stage 3a: Secondary | ICD-10-CM

## 2019-12-10 DIAGNOSIS — R42 Dizziness and giddiness: Secondary | ICD-10-CM

## 2019-12-10 DIAGNOSIS — J449 Chronic obstructive pulmonary disease, unspecified: Secondary | ICD-10-CM

## 2019-12-10 LAB — URINALYSIS, ROUTINE W REFLEX MICROSCOPIC
Bilirubin Urine: NEGATIVE
Hgb urine dipstick: NEGATIVE
Ketones, ur: NEGATIVE
Leukocytes,Ua: NEGATIVE
Nitrite: NEGATIVE
RBC / HPF: NONE SEEN (ref 0–?)
Specific Gravity, Urine: 1.01 (ref 1.000–1.030)
Total Protein, Urine: NEGATIVE
Urine Glucose: NEGATIVE
Urobilinogen, UA: 0.2 (ref 0.0–1.0)
pH: 6 (ref 5.0–8.0)

## 2019-12-10 LAB — BASIC METABOLIC PANEL
BUN: 24 mg/dL — ABNORMAL HIGH (ref 6–23)
CO2: 29 mEq/L (ref 19–32)
Calcium: 9.5 mg/dL (ref 8.4–10.5)
Chloride: 103 mEq/L (ref 96–112)
Creatinine, Ser: 1.27 mg/dL (ref 0.40–1.50)
GFR: 52.26 mL/min — ABNORMAL LOW (ref >60.00)
Glucose, Bld: 100 mg/dL — ABNORMAL HIGH (ref 70–99)
Potassium: 3.7 mEq/L (ref 3.5–5.1)
Sodium: 138 mEq/L (ref 135–145)

## 2019-12-10 NOTE — Progress Notes (Signed)
Patient ID: Nathaniel Thompson, male   DOB: 1935-06-13, 84 y.o.   MRN: [id number]   Subjective:    Patient ID: Nathaniel Thompson, male    DOB: 08-16-1935, 84 y.o.   MRN: [id number]  HPI This visit occurred during the SARS-CoV-2 public health emergency.  Safety protocols were in place, including screening questions prior to the visit, additional usage of staff PPE, and extensive cleaning of exam room while observing appropriate contact time as indicated for disinfecting solutions.  Patient here for a scheduled follow up.  He is accompanied by his wife. History obtained from both of them.  States he was at his granddaughter's wedding (in [place]) 11/15/19.  Outside wedding.  He was cold.  States that he was standing by the fire and for a brief period he felt funny.  Could not comprehend - around him - for very brief time.  Question of slurring of speech, but family and pt unsure.  The pt and family deny confusion.  No LOC.  Felt a little light headed.  Has had issues with light headedness and dizziness in the past.  No chest pain.  States after the episode in [place], he was ok, just felt drained.  Was questioning if being so cold could have aggravated.  Reports noticing some increased sob with exertion.  States this may be a little worse.  He is eating.  No headache.  No increased cough or congestion.  Bowels moving. Seeing Dr Rogue Bussing for f/u prostate cancer and IDA.  hgb 12.  Has known AAA and carotid stenosis - being followed by [facility].  Has f/u scheduled in 01/2020.     Past Medical History:  Diagnosis Date  . (HFpEF) heart failure with preserved ejection fraction ([place])    a. 05/2018 Echo: Nl EF, Gr1 DD, sev dil LA, mild MR/TR, mild PAH; b. 05/2019 Echo: EF 55-60%, no rwma, mild LVH. Nl PASP. Mildly dil LA. Triv MR. Mild AoV sclerosis w/o stenosis. Mildly dil Asc AO (102m).  . Allergic rhinitis   . Barrett's esophagus 10/21/13  . Bone cancer ([facility]   . CKD (chronic kidney disease), stage III ([facility]   .  Colon polyp, hyperplastic   . COPD (chronic obstructive pulmonary disease) ([facility])    mild COPD. former smoker  . Coronary artery disease    a. 2003 s/p PCI ([facility]); b. 2004 s/p PCI x 2 ([facility]); c. 11/2017 Cath: LM nl, LAD mild dzs, LCX patent stent w/ 30% distal edge restenosis, RCA patent distal stent w/ 30% prox edge stenosis. Nl EF->Med Rx.  . Diverticulosis   . Fundic gland polyps of stomach, benign   . Gastritis   . GERD (gastroesophageal reflux disease)   . Hypercholesteremia   . Hypertension   . Prostate cancer ([place]   . Prostatism   . Reflux esophagitis   . Renal stones   . Skin cancer    left cheek/lesion excised  . Sleep apnea   . Tachycardia    a. 11/2017 admit w/ tachycardia/? afib-->no afib noted upon review (amio/OAC d/c'd).  .Marland KitchenTIA (transient ischemic attack)    Past Surgical History:  Procedure Laterality Date  . CARDIAC CATHETERIZATION    . COLONOSCOPY WITH PROPOFOL N/A 09/13/2015   Procedure: COLONOSCOPY WITH PROPOFOL;  Surgeon: MLollie Sails MD;  [facility]  Service: Endoscopy;  Laterality: N/A;  . COLONOSCOPY WITH PROPOFOL N/A 05/07/2019   Procedure: COLONOSCOPY WITH PROPOFOL;  Surgeon: BRobert Bellow MD;  Location: [facility] ENDOSCOPY;  Service:  Endoscopy;  Laterality: N/A;  . CORONARY ANGIOPLASTY    . ESOPHAGOGASTRODUODENOSCOPY (EGD) WITH PROPOFOL N/A 09/13/2015   Procedure: ESOPHAGOGASTRODUODENOSCOPY (EGD) WITH PROPOFOL;  Surgeon: Lollie Sails, MD;  Location: [facility] ENDOSCOPY;  Service: Endoscopy;  Laterality: N/A;  . ESOPHAGOGASTRODUODENOSCOPY (EGD) WITH PROPOFOL N/A 12/28/2015   Procedure: ESOPHAGOGASTRODUODENOSCOPY (EGD) WITH PROPOFOL;  Surgeon: Lollie Sails, MD;  Location[facility] END[facility]COPY;  Service: Endoscopy;  Laterality: N/A;  . ESOPHAGOGASTRODUODENOSCOPY (EGD) WITH PROPOFOL N/A 06/16/2016   Procedure: ESOPHAGOGASTRODUODENOSCOPY (EGD) WITH PROPOFOL;  Surgeon: Lollie Sails, MD;  Location: [facility] ENDOSCOPY;  Service: Endoscopy;   Laterality: N/A;  . ESOPHAGOGASTRODUODENOSCOPY (EGD) WITH PROPOFOL N/A 05/07/2019   Procedure: ESOPHAGOGASTRODUODENOSCOPY (EGD) WITH PROPOFOL;  Surgeon: Robert Bellow, MD;  Location: [facility] ENDOSCOPY;  Service: Endoscopy;  Laterality: N/A;  . EYE SURGERY  2013   CATARACT EXTRACTION  . GANGLION CYST EXCISION Right 05/18/2017   Procedure: REMOVAL GANGLION CYST ANKLE;  Surgeon: Samara Deist, DPM;  Location: [facility] ORS;  Service: Podiatry;  Laterality: Right;  . heart cath stent    . LEFT HEART CATH AND CORONARY ANGIOGRAPHY Right 12/03/2017   Procedure: Left Heart Cath and Coronary Angiography with possible coronary intervention;  Surgeon: Dionisio David, MD;  Location: [place] CV LAB;  Service: Cardiovascular;  Laterality: Right;  . PROSTATE BIOPSY    . stents     multiple   Family History  Problem Relation Age of Onset  . Cancer Mother        gastric and lung  . Cancer Father        multiple myeloma  . Stroke Father   . Cancer Sister        leukemia  . Cancer Brother        leukemia  . Cancer Brother        kidney  . Cancer Daughter        Uterine  . Cancer Other        [place] (Sister's Son): Prostate   Social History   Socioeconomic History  . Marital status: Married    Spouse name: Not on file  . Number of children: Not on file  . Years of education: Not on file  . Highest education level: Not on file  Occupational History  . Not on file  Tobacco Use  . Smoking status: Former Smoker    Packs/day: 1.00    Years: 35.00    Pack years: 35.00    Types: Cigarettes    Quit date: 03/06/1986    Years since quitting: 33.8  . Smokeless tobacco: Former Systems developer    Types: Secondary school teacher  . Vaping Use: Never used  Substance and Sexual Activity  . Alcohol use: No  . Drug use: No  . Sexual activity: Not Currently  Other Topics Concern  . Not on file  Social History Narrative  . Not on file   Social Determinants of Health   Financial Resource Strain:   . Difficulty  of Paying Living Expenses: Not on file  Food Insecurity:   . Worried About Charity fundraiser in the Last Year: Not on file  . Ran Out of Food in the Last Year: Not on file  Transportation Needs:   . Lack of Transportation (Medical): Not on file  . Lack of Transportation (Non-Medical): Not on file  Physical Activity:   . Days of Exercise per Week: Not on file  . Minutes of Exercise per Session: Not on file  Stress:   .  Feeling of Stress : Not on file  Social Connections:   . Frequency of Communication with Friends and Family: Not on file  . Frequency of Social Gatherings with Friends and Family: Not on file  . Attends Religious Services: Not on file  . Active Member of Clubs or Organizations: Not on file  . Attends Archivist Meetings: Not on file  . Marital Status: Not on file    Outpatient Encounter Medications as of 12/10/2019  Medication Sig  . amLODipine (NORVASC) 10 MG tablet TAKE 1 TABLET BY MOUTH ONCE DAILY  . aspirin EC 81 MG tablet Take 81 mg by mouth daily.  . calcium citrate-vitamin D (CITRACAL+D) 315-200 MG-UNIT tablet Take 1 tablet by mouth daily.  . chlorthalidone (HYGROTON) 25 MG tablet Take 0.5 tablets (12.5 mg total) by mouth daily.  . cholecalciferol (VITAMIN D) 1000 units tablet Take 1,000 Units by mouth daily.   . clopidogrel (PLAVIX) 75 MG tablet Take 1 tablet (75 mg total) by mouth daily.  [name] ezetimibe (ZETIA) 10 MG tablet Take 1 tablet (10 mg total) by mouth daily.  . fexofenadine (ALLEGRA) 180 MG tablet Take 180 mg by mouth daily as needed for allergies or rhinitis. (Patient not taking: Reported on 10/07/2019)  . fluticasone (FLONASE) 50 MCG/ACT nasal spray Place 2 sprays into both nostrils daily. (Patient not taking: Reported on 10/07/2019)  . isosorbide mononitrate (IMDUR) 30 MG 24 hr tablet TAKE 1 TABLET BY MOUTH ONCE DAILY  . losartan (COZAAR) 50 MG tablet Take 1 tablet (50 mg total) by mouth daily.  [name] lovastatin (MEVACOR) 40 MG tablet TAKE 2  TABLETS BY MOUTH AT BEDTIME  . Multiple Vitamin (MULTI-VITAMINS) TABS Take 1 tablet by mouth daily.   . nitroGLYCERIN (NITROSTAT) 0.4 MG SL tablet Place 1 tablet (0.4 mg total) under the tongue every 5 (five) minutes as needed for chest pain.  . pantoprazole (PROTONIX) 40 MG tablet Take 40 mg by mouth 2 (two) times daily.   . [DISCONTINUED] gabapentin (NEURONTIN) 100 MG capsule Take 100 mg by mouth at bedtime.  (Patient not taking: Reported on 10/07/2019)  . [DISCONTINUED] POLYETHYLENE GLYCOL 3350 PO Take by mouth. (Patient not taking: Reported on 10/07/2019)  . [DISCONTINUED] potassium chloride SA (KLOR-CON) 20 MEQ tablet TAKE 1 TABLET BY MOUTH ONCE DAILY   No facility-administered encounter medications on file as of 12/10/2019.    Review of Systems  Constitutional: Negative for appetite change and unexpected weight change.  HENT: Negative for congestion and sinus pressure.   Respiratory: Positive for shortness of breath. Negative for cough and chest tightness.   Cardiovascular: Negative for chest pain, palpitations and leg swelling.  Gastrointestinal: Negative for abdominal pain, diarrhea, nausea and vomiting.  Genitourinary: Negative for difficulty urinating and dysuria.  Musculoskeletal: Negative for joint swelling and myalgias.  Skin: Negative for color change and rash.  Neurological: Positive for light-headedness. Negative for headaches.  Psychiatric/Behavioral: Negative for agitation and dysphoric mood.       Objective:    Physical Exam Vitals reviewed.  Constitutional:      General: He is not in acute distress.    Appearance: Normal appearance. He is well-developed.  HENT:     Head: Normocephalic and atraumatic.     Right Ear: External ear normal.     Left Ear: External ear normal.  Eyes:     General: No scleral icterus.       Right eye: No discharge.        Left eye: No discharge.  Conjunctiva/sclera: Conjunctivae normal.  Cardiovascular:     Rate and Rhythm: Normal  rate and regular rhythm.  Pulmonary:     Effort: Pulmonary effort is normal. No respiratory distress.     Breath sounds: Normal breath sounds.  Abdominal:     General: Bowel sounds are normal.     Palpations: Abdomen is soft.     Tenderness: There is no abdominal tenderness.  Musculoskeletal:        General: No swelling or tenderness.     Cervical back: Neck supple. No tenderness.  Lymphadenopathy:     Cervical: No cervical adenopathy.  Skin:    Findings: No erythema or rash.  Neurological:     Mental Status: He is alert.  Psychiatric:        Mood and Affect: Mood normal.        Behavior: Behavior normal.     BP 128/70   Pulse 76   Temp 97.7 F (36.5 C) (Oral)   Resp 16   Ht _0  (1.753 m)   Wt 218 lb (98.9 kg)   SpO2 98%   BMI 32.19 kg/m  Wt Readings from Last 3 Encounters:  12/10/19 218 lb (98.9 kg)  10/07/19 214 lb (97.1 kg)  08/06/19 213 lb 6.4 oz (96.8 kg)     Lab Results  Component Value Date   WBC 4.5 10/07/2019   HGB 12.5 (L) 10/07/2019   HCT 35.2 (L) 10/07/2019   PLT 200 10/07/2019   GLUCOSE 100 (H) 12/10/2019   CHOL 117 12/08/2019   TRIG 111.0 12/08/2019   HDL 33.60 (L) 12/08/2019   LDLCALC 61 12/08/2019   ALT 13 12/08/2019   AST 18 12/08/2019   NA 138 12/10/2019   K 3.7 12/10/2019   CL 103 12/10/2019   CREATININE 1.27 12/10/2019   BUN 24 (H) 12/10/2019   CO2 29 12/10/2019   TSH 1.82 03/31/2019   PSA 5.47 (H) 08/03/2016   HGBA1C 5.9 12/08/2019       Assessment & Plan:   Problem List Items Addressed This Visit    Sleep apnea    Using cpap.       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  Last evaluated 01/2019.  Stable.  Recommended annual f/u.       Iron deficiency anemia due to chronic blood loss    Had colonoscopy 05/07/19.  Seeing Dr Rogue Bussing.  Last hgb 12.       Hypertension, essential    Blood pressure on recheck improved.  Continues on chlorthalidone, imdur, amlodipine and losartan.  Follow pressures.   Follow metabolic panel.        Relevant Orders   Basic metabolic panel   TSH   Hyperglycemia    Low carb diet and exercise.  Follow met b and a1c.       Relevant Orders   Hemoglobin A1c   Hypercholesterolemia    On lovastatin.  Low cholesterol diet and exercise.  Follow lipid panel and liver function tests.        Relevant Orders   Hepatic function panel   Lipid panel   GERD without esophagitis    Appears to be controlled on protonix.       Dizziness    Has been an intermittent issue. Has had extensive w/up as outlined in previous note.  Had the episode in [place].  Unclear etiology.  Was cold and had been outside all day.  No recurrence like that since the episode  in [place].  Is on aspirin and plavix.  With increased sob with exertion, will have cardiology evaluate.  Discussed earlier f/u with neurology.  Discussed possible TIA, etc.  Has f/u regarding carotids with [facility].        COPD (chronic obstructive pulmonary disease) ([place])    Describes some increased sob with exertion.  No cough or congestion.  Pursue cardiac w/up as outlined.        CKD (chronic kidney disease)    Recent labs revealed a decrease in GFR.  Stay hydrated.  Avoid antiinflammatories.  Recheck metabolic panel today.        Carotid artery disease ([place])    Last f/u <70%.  Stable.  Continues on plavix and lovastatin.  Has f/u scheduled with [facility] in 01/2020.        CAD (coronary artery disease)    With increased sob with exertion, EKG - SR with no acute ischemic changes when compared to previous EKG.  Sees cardiology.  Has appt scheduled in December.  Arrange earlier f/u to evaluate with question of need for further cardiac testing.        Atrial fibrillation, new onset ([place])    Rhythm regular today.  Rate controlled.  Denies increased heart rate or palpitations.        Anemia    Followed by hematology - IDA.  Last hgb 12.        Abdominal aortic aneurysm (AAA) ([place])    Followed by [facility].  Has f/u  scheduled in 01/2020.        Other Visit Diagnoses    SOB (shortness of breath)    -  Primary   Relevant Orders   EKG 12-Lead (Completed)   Decreased GFR       Relevant Orders   Basic metabolic panel (Completed)   Urinalysis, Routine w reflex microscopic (Completed)      I spent over 40 minutes with the patient and more than 50% of the time was spent in consultation regarding the above.  Time spent discussing his previous concerns and current symptoms. Time also spent discussing plans for f/u w/up and evaluation and treatment.    Einar Pheasant, MD

## 2019-12-15 ENCOUNTER — Encounter: Payer: Self-pay | Admitting: Internal Medicine

## 2019-12-15 NOTE — Assessment & Plan Note (Signed)
Rhythm regular today.  Rate controlled.  Denies increased heart rate or palpitations.

## 2019-12-15 NOTE — Assessment & Plan Note (Signed)
Low carb diet and exercise.  Follow met b and a1c.  

## 2019-12-15 NOTE — Assessment & Plan Note (Signed)
Appears to be controlled on protonix.

## 2019-12-15 NOTE — Assessment & Plan Note (Signed)
Last f/u <70%.  Stable.  Continues on plavix and lovastatin.  Has f/u scheduled with [facility] in 01/2020.

## 2019-12-15 NOTE — Assessment & Plan Note (Signed)
Recent labs revealed a decrease in GFR.  Stay hydrated.  Avoid antiinflammatories.  Recheck metabolic panel today.

## 2019-12-15 NOTE — Assessment & Plan Note (Signed)
Using cpap

## 2019-12-15 NOTE — Assessment & Plan Note (Signed)
Blood pressure on recheck improved.  Continues on chlorthalidone, imdur, amlodipine and losartan.  Follow pressures.  Follow metabolic panel.

## 2019-12-15 NOTE — Assessment & Plan Note (Signed)
With increased sob with exertion, EKG - SR with no acute ischemic changes when compared to previous EKG.  Sees cardiology.  Has appt scheduled in December.  Arrange earlier f/u to evaluate with question of need for further cardiac testing.

## 2019-12-15 NOTE — Assessment & Plan Note (Signed)
Followed by hematology - IDA.  Last hgb 12.

## 2019-12-15 NOTE — Assessment & Plan Note (Signed)
Followed by [facility].  Has f/u scheduled in 01/2020.

## 2019-12-15 NOTE — Assessment & Plan Note (Signed)
On lovastatin.  Low cholesterol diet and exercise.  Follow lipid panel and liver function tests.   

## 2019-12-15 NOTE — Assessment & Plan Note (Signed)
Had colonoscopy 05/07/19.  Seeing Dr Rogue Bussing.  Last hgb 12.

## 2019-12-15 NOTE — Assessment & Plan Note (Signed)
Describes some increased sob with exertion.  No cough or congestion.  Pursue cardiac w/up as outlined.

## 2019-12-15 NOTE — Assessment & Plan Note (Signed)
Has been an intermittent issue. Has had extensive w/up as outlined in previous note.  Had the episode in [place].  Unclear etiology.  Was cold and had been outside all day.  No recurrence like that since the episode in [place].  Is on aspirin and plavix.  With increased sob with exertion, will have cardiology evaluate.  Discussed earlier f/u with neurology.  Discussed possible TIA, etc.  Has f/u regarding carotids with [facility].

## 2019-12-15 NOTE — Assessment & Plan Note (Signed)
Evaluated by [facility].  Last evaluated 01/2019.  Stable.  Recommended annual f/u.

## 2019-12-26 ENCOUNTER — Other Ambulatory Visit: Payer: Self-pay | Admitting: Neurology

## 2019-12-26 DIAGNOSIS — R569 Unspecified convulsions: Secondary | ICD-10-CM

## 2020-01-05 ENCOUNTER — Other Ambulatory Visit: Payer: Self-pay

## 2020-01-05 DIAGNOSIS — C61 Malignant neoplasm of prostate: Secondary | ICD-10-CM

## 2020-01-06 ENCOUNTER — Inpatient Hospital Stay: Payer: Medicare HMO | Admitting: Internal Medicine

## 2020-01-06 ENCOUNTER — Inpatient Hospital Stay: Payer: Medicare HMO | Attending: Internal Medicine

## 2020-01-06 ENCOUNTER — Other Ambulatory Visit: Payer: Self-pay

## 2020-01-06 ENCOUNTER — Encounter: Payer: Self-pay | Admitting: Internal Medicine

## 2020-01-06 ENCOUNTER — Inpatient Hospital Stay: Payer: Medicare HMO

## 2020-01-06 DIAGNOSIS — Z5111 Encounter for antineoplastic chemotherapy: Secondary | ICD-10-CM | POA: Diagnosis not present

## 2020-01-06 DIAGNOSIS — C61 Malignant neoplasm of prostate: Secondary | ICD-10-CM | POA: Diagnosis not present

## 2020-01-06 DIAGNOSIS — C7951 Secondary malignant neoplasm of bone: Secondary | ICD-10-CM | POA: Diagnosis not present

## 2020-01-06 DIAGNOSIS — D509 Iron deficiency anemia, unspecified: Secondary | ICD-10-CM | POA: Diagnosis not present

## 2020-01-06 LAB — FERRITIN: Ferritin: 29 ng/mL (ref 24–336)

## 2020-01-06 LAB — CBC WITH DIFFERENTIAL/PLATELET
Abs Immature Granulocytes: 0.02 10*3/uL (ref 0.00–0.07)
Basophils Absolute: 0 10*3/uL (ref 0.0–0.1)
Basophils Relative: 1 %
Eosinophils Absolute: 0.3 10*3/uL (ref 0.0–0.5)
Eosinophils Relative: 6 %
HCT: 33.8 % — ABNORMAL LOW (ref 39.0–52.0)
Hemoglobin: 12 g/dL — ABNORMAL LOW (ref 13.0–17.0)
Immature Granulocytes: 0 %
Lymphocytes Relative: 29 %
Lymphs Abs: 1.4 10*3/uL (ref 0.7–4.0)
MCH: 31 pg (ref 26.0–34.0)
MCHC: 35.5 g/dL (ref 30.0–36.0)
MCV: 87.3 fL (ref 80.0–100.0)
Monocytes Absolute: 0.5 10*3/uL (ref 0.1–1.0)
Monocytes Relative: 10 %
Neutro Abs: 2.6 10*3/uL (ref 1.7–7.7)
Neutrophils Relative %: 54 %
Platelets: 192 10*3/uL (ref 150–400)
RBC: 3.87 MIL/uL — ABNORMAL LOW (ref 4.22–5.81)
RDW: 13.1 % (ref 11.5–15.5)
WBC: 4.7 10*3/uL (ref 4.0–10.5)
nRBC: 0 % (ref 0.0–0.2)

## 2020-01-06 LAB — COMPREHENSIVE METABOLIC PANEL
ALT: 8 U/L (ref 0–44)
AST: 24 U/L (ref 15–41)
Albumin: 4 g/dL (ref 3.5–5.0)
Alkaline Phosphatase: 46 U/L (ref 38–126)
Anion gap: 7 (ref 5–15)
BUN: 22 mg/dL (ref 8–23)
CO2: 29 mmol/L (ref 22–32)
Calcium: 9.4 mg/dL (ref 8.9–10.3)
Chloride: 102 mmol/L (ref 98–111)
Creatinine, Ser: 1.25 mg/dL — ABNORMAL HIGH (ref 0.61–1.24)
GFR, Estimated: 57 mL/min — ABNORMAL LOW (ref >60)
Glucose, Bld: 133 mg/dL — ABNORMAL HIGH (ref 70–99)
Potassium: 3.7 mmol/L (ref 3.5–5.1)
Sodium: 138 mmol/L (ref 135–145)
Total Bilirubin: 0.9 mg/dL (ref 0.3–1.2)
Total Protein: 7.2 g/dL (ref 6.5–8.1)

## 2020-01-06 LAB — IRON AND TIBC
Iron: 105 ug/dL (ref 45–182)
Saturation Ratios: 30 % (ref 17.9–39.5)
TIBC: 354 ug/dL (ref 250–450)
UIBC: 249 ug/dL

## 2020-01-06 MED ORDER — LEUPROLIDE ACETATE (3 MONTH) 22.5 MG ~~LOC~~ KIT
22.5000 mg | PACK | Freq: Once | SUBCUTANEOUS | Status: AC
  Administered 2020-01-06: 22.5 mg via SUBCUTANEOUS
  Filled 2020-01-06: qty 22.5

## 2020-01-06 NOTE — Assessment & Plan Note (Addendum)
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 Stage IV-on Eligard; Bone scan October 2020-stable positive response. AUG  2021- PSA- <0.01.  STABLE.   #Currently off Zytiga secondary to poor tolerance.  Await PSA from today; if rising could consider alternative therapy.   #Iron deficient anemia/CKD- III-[s/p EGD March 2021]-continue p.o. iron.  Hold Venofer.  Hemoglobin 12 stable.  # Dizzy spells- ? Seizures [s/p EEG; Dr.Shah]; MRI brain-p. ? Relative hypotension-see below  # A. fib paroxysmal-sinus rhythm; aspirin Plavix- STABLE/ relative hypotension- ? DC chlorthalidone; ? Cut dose for amlodipine. Wants to defer to cards.    # DISPOSITION: Call PSA # Eligard today; HOLD Venofer # Follow up in 3 months 2021- labs-cbc/cmp/PSA; eligard; possible venofer- Dr.B  Cc; Dr.Scott/Dr.Arida.

## 2020-01-06 NOTE — Progress Notes (Signed)
[facility] OFFICE PROGRESS NOTE  Patient Care Team: Einar Pheasant, MD as PCP - General (Internal Medicine) [name], MD as PCP - Cardiology (Cardiology) [name], MD as Consulting Physician (Cardiology)  Cancer Staging No matching staging information was found for the patient.   Oncology History Overview Note  Nov 2017- completed IM RT radiation therapy to his prostate and pelvic nodes for Gleason 7 (4+3) adenocarcinoma the prostate presenting the PSA of 7.8.  # JAN 2019- METASTATIC PROSTATE CA to Bone [low volume met on bone scan; CT- NED]; PSA ~50; March 25th 2019- Lupron 45 mg IM [Urology q 22M]; June 26, 2018-Eligard every 3 months[intolerance to Lupron/question A. Fib/injection site pain  # May 23rd Zytiga 237m/day; no prednisone; STOPPED MARCH 17th 2021-repeated severe hypokalemia;   # Barret[place]- EGD/ colo [[place]2021; Dr.Byrnett].   # OBenita Stabile [place]]; Oct 2019-paroxysmal A.fib/not on eliquis; [Dr.Khan] -Dr.Arida   ------------------------------------------------------------------  DIAGNOSIS: [ JAN 2019]- Met- PROSTATE CANCER  STAGE:  IV       ;GOALS: PALLIATIVE  CURRENT/MOST RECENT THERAPY [ march 2019]- Lupron; May 23rd 2019- Zytiga 2519mday; discontinued-March 2021.     [facility]    INTERVAL HISTORY:  JaPRATHER FAILLA89.o.  male pleasant patient above history of metastatic prostate cancer on ADT is here for follow-up.  Zytiga is discontinued because of poor tolerance.  In the interim patient was evaluated by neurology for his dizzy spells.  He had a EEG results pending.  He had an MRI next week.  He was also started on carbidopa levodopa.  Patient overall is doing well-except for intermittent dizzy spells.  He continues to be on p.o. iron.  No nausea no vomiting.  No chest pain or shortness of breath or cough.  Review of Systems  Constitutional: Negative for chills, diaphoresis, fever,  malaise/fatigue and weight loss.  HENT: Negative for nosebleeds and sore throat.   Eyes: Negative for double vision.  Respiratory: Negative for cough, hemoptysis, sputum production, shortness of breath and wheezing.   Cardiovascular: Negative for chest pain, palpitations, orthopnea and leg swelling.  Gastrointestinal: Negative for abdominal pain, blood in stool, constipation, diarrhea, heartburn, melena, nausea and vomiting.  Genitourinary: Negative for dysuria, frequency and urgency.  Musculoskeletal: Positive for myalgias. Negative for back pain and joint pain.  Skin: Negative.  Negative for itching and rash.  Neurological: Positive for dizziness. Negative for tingling, focal weakness, weakness and headaches.  Endo/Heme/Allergies: Does not bruise/bleed easily.  Psychiatric/Behavioral: Negative for depression. The patient is not nervous/anxious and does not have insomnia.     PAST MEDICAL HISTORY :  Past Medical History:  Diagnosis Date  . (HFpEF) heart failure with preserved ejection fraction ([facility]   a. 05/2018 Echo: Nl EF, Gr1 DD, sev dil LA, mild MR/TR, mild PAH; b. 05/2019 Echo: EF 55-60%, no rwma, mild LVH. Nl PASP. Mildly dil LA. Triv MR. Mild AoV sclerosis w/o stenosis. Mildly dil Asc AO (3919m  . Allergic rhinitis   . Barrett's esophagus 10/21/13  . Bone cancer ([facility] . CKD (chronic kidney disease), stage III ([facility] . Colon polyp, hyperplastic   . COPD (chronic obstructive pulmonary disease) (HCC)    mild COPD. former smoker  . Coronary artery disease    a. 2003 s/p PCI ([facility]); b. 2004 s/p PCI x 2 ([facility]); c. 11/2017 Cath: LM nl, LAD mild dzs, LCX patent stent w/ 30% distal edge restenosis, RCA patent distal stent w/  30% prox edge stenosis. Nl EF->Med Rx.  . Diverticulosis   . Fundic gland polyps of stomach, benign   . Gastritis   . GERD (gastroesophageal reflux disease)   . Hypercholesteremia   . Hypertension   . Prostate cancer ([place])   . Prostatism   . Reflux esophagitis    . Renal stones   . Skin cancer    left cheek/lesion excised  . Sleep apnea   . Tachycardia    a. 11/2017 admit w/ tachycardia/? afib-->no afib noted upon review (amio/OAC d/c'd).  [name] TIA (transient ischemic attack)     PAST SURGICAL HISTORY :   Past Surgical History:  Procedure Laterality Date  . CARDIAC CATHETERIZATION    . COLONOSCOPY WITH PROPOFOL N/A 09/13/2015   Procedure: COLONOSCOPY WITH PROPOFOL;  Surgeon: Lollie Sails, MD;  Location: [facility] ENDOSCOPY;  Service: Endoscopy;  Laterality: N/A;  . COLONOSCOPY WITH PROPOFOL N/A 05/07/2019   Procedure: COLONOSCOPY WITH PROPOFOL;  Surgeon: Robert Bellow, MD;  Location: [facility] ENDOSCOPY;  Service: Endoscopy;  Laterality: N/A;  . CORONARY ANGIOPLASTY    . ESOPHAGOGASTRODUODENOSCOPY (EGD) WITH PROPOFOL N/A 09/13/2015   Procedure: ESOPHAGOGASTRODUODENOSCOPY (EGD) WITH PROPOFOL;  Surgeon: Lollie Sails, MD;  Location: [facility] ENDOSCOPY;  Service: Endoscopy;  Laterality: N/A;  . ESOPHAGOGASTRODUODENOSCOPY (EGD) WITH PROPOFOL N/A 12/28/2015   Procedure: ESOPHAGOGASTRODUODENOSCOPY (EGD) WITH PROPOFOL;  Surgeon: Lollie Sails, MD;  Location: [facility] ENDOSCOPY;  Service: Endoscopy;  Laterality: N/A;  . ESOPHAGOGASTRODUODENOSCOPY (EGD) WITH PROPOFOL N/A 06/16/2016   Procedure: ESOPHAGOGASTRODUODENOSCOPY (EGD) WITH PROPOFOL;  Surgeon: Lollie Sails, MD;  Location: [facility] ENDOSCOPY;  Service: Endoscopy;  Laterality: N/A;  . ESOPHAGOGASTRODUODENOSCOPY (EGD) WITH PROPOFOL N/A 05/07/2019   Procedure: ESOPHAGOGASTRODUODENOSCOPY (EGD) WITH PROPOFOL;  Surgeon: Robert Bellow, MD;  Location: [facility] ENDOSCOPY;  Service: Endoscopy;  Laterality: N/A;  . EYE SURGERY  2013   CATARACT EXTRACTION  . GANGLION CYST EXCISION Right 05/18/2017   Procedure: REMOVAL GANGLION CYST ANKLE;  Surgeon: Samara Deist, DPM;  Location: [facility] ORS;  Service: Podiatry;  Laterality: Right;  . heart cath stent    . LEFT HEART CATH AND CORONARY ANGIOGRAPHY Right 12/03/2017    Procedure: Left Heart Cath and Coronary Angiography with possible coronary intervention;  Surgeon: Dionisio David, MD;  Location: [facility];  Service: Cardiovascular;  Laterality: Right;  . PROSTATE BIOPSY    . stents     multiple    FAMILY HISTORY :   Family History  Problem Relation Age of Onset  . Cancer Mother        gastric and lung  . Cancer Father        multiple myeloma  . Stroke Father   . Cancer Sister        leukemia  . Cancer Brother        leukemia  . Cancer Brother        kidney  . Cancer Daughter        Uterine  . Cancer Other        [place] (Sister's Son): Prostate    SOCIAL HISTORY:   Social History   Tobacco Use  . Smoking status: Former Smoker    Packs/day: 1.00    Years: 35.00    Pack years: 35.00    Types: Cigarettes    Quit date: 03/06/1986    Years since quitting: 33.8  . Smokeless tobacco: Former Systems developer    Types: Secondary school teacher  . Vaping Use: Never used  Substance Use Topics  .  Alcohol use: No  . Drug use: No    ALLERGIES:  is allergic to atorvastatin.  MEDICATIONS:  Current Outpatient Medications  Medication Sig Dispense Refill  . amLODipine (NORVASC) 10 MG tablet TAKE 1 TABLET BY MOUTH ONCE DAILY 90 tablet 1  . aspirin EC 81 MG tablet Take 81 mg by mouth daily.    . calcium citrate-vitamin D (CITRACAL+D) 315-200 MG-UNIT tablet Take 1 tablet by mouth daily.    . carbidopa-levodopa (SINEMET IR) 25-100 MG tablet Take half tablet three times a day for four days then increase to 1 tablet three times a day    . chlorthalidone (HYGROTON) 25 MG tablet Take 0.5 tablets (12.5 mg total) by mouth daily. 30 tablet 1  . cholecalciferol (VITAMIN D) 1000 units tablet Take 1,000 Units by mouth daily.     . clopidogrel (PLAVIX) 75 MG tablet Take 1 tablet (75 mg total) by mouth daily. 90 tablet 3  . ezetimibe (ZETIA) 10 MG tablet Take 1 tablet (10 mg total) by mouth daily. 90 tablet 2  . ferrous sulfate 325 (65 FE) MG tablet Take by mouth.     . fexofenadine (ALLEGRA) 180 MG tablet Take 180 mg by mouth daily as needed for allergies or rhinitis.     . fluticasone (FLONASE) 50 MCG/ACT nasal spray Place 2 sprays into both nostrils daily.     . isosorbide mononitrate (IMDUR) 30 MG 24 hr tablet TAKE 1 TABLET BY MOUTH ONCE DAILY 90 tablet 2  . losartan (COZAAR) 50 MG tablet Take 1 tablet (50 mg total) by mouth daily. 90 tablet 1  . lovastatin (MEVACOR) 40 MG tablet TAKE 2 TABLETS BY MOUTH AT BEDTIME 180 tablet 1  . Multiple Vitamin (MULTI-VITAMINS) TABS Take 1 tablet by mouth daily.     . nitroGLYCERIN (NITROSTAT) 0.4 MG SL tablet Place 1 tablet (0.4 mg total) under the tongue every 5 (five) minutes as needed for chest pain. 25 tablet 0  . pantoprazole (PROTONIX) 40 MG tablet Take 40 mg by mouth 2 (two) times daily.      No current facility-administered medications for this visit.    PHYSICAL EXAMINATION: ECOG PERFORMANCE STATUS: 0 - Asymptomatic  BP 112/66 (BP Location: Left Arm, Patient Position: Sitting, Cuff Size: Large)   Pulse 75   Temp 97.6 F (36.4 C) (Tympanic)   Resp 16   Ht 5' 9" (1.753 m)   Wt 214 lb (97.1 kg)   SpO2 100%   BMI 31.60 kg/m   Filed Weights   01/06/20 1302  Weight: 214 lb (97.1 kg)    Physical Exam Constitutional:      Comments: Patient is with his wife.  He is walking independently.  HENT:     Head: Normocephalic and atraumatic.     Mouth/Throat:     Pharynx: No oropharyngeal exudate.  Eyes:     Pupils: Pupils are equal, round, and reactive to light.  Cardiovascular:     Rate and Rhythm: Normal rate and regular rhythm.  Pulmonary:     Effort: No respiratory distress.     Breath sounds: No wheezing.  Abdominal:     General: Bowel sounds are normal. There is no distension.     Palpations: Abdomen is soft. There is no mass.     Tenderness: There is no abdominal tenderness. There is no guarding or rebound.  Musculoskeletal:        General: No tenderness. Normal range of motion.      Cervical back: Normal range  of motion and neck supple.  Skin:    General: Skin is warm.  Neurological:     Mental Status: He is alert and oriented to person, place, and time.  Psychiatric:        Mood and Affect: Affect normal.     LABORATORY DATA:  I have reviewed the data as listed    Component Value Date/Time   NA 138 01/06/2020 1252   NA 140 09/16/2013 0438   K 3.7 01/06/2020 1252   K 4.2 09/16/2013 0438   CL 102 01/06/2020 1252   CL 109 (H) 09/16/2013 0438   CO2 29 01/06/2020 1252   CO2 24 09/16/2013 0438   GLUCOSE 133 (H) 01/06/2020 1252   GLUCOSE 101 (H) 09/16/2013 0438   BUN 22 01/06/2020 1252   BUN 20 (H) 09/16/2013 0438   CREATININE 1.25 (H) 01/06/2020 1252   CREATININE 1.28 09/16/2013 0438   CALCIUM 9.4 01/06/2020 1252   CALCIUM 8.3 (L) 09/16/2013 0438   PROT 7.2 01/06/2020 1252   PROT 6.7 09/15/2013 0755   ALBUMIN 4.0 01/06/2020 1252   ALBUMIN 3.5 09/15/2013 0755   AST 24 01/06/2020 1252   AST 37 09/15/2013 0755   ALT 8 01/06/2020 1252   ALT 28 09/15/2013 0755   ALKPHOS 46 01/06/2020 1252   ALKPHOS 44 (L) 09/15/2013 0755   BILITOT 0.9 01/06/2020 1252   BILITOT 0.5 09/15/2013 0755   GFRNONAA 57 (L) 01/06/2020 1252   GFRNONAA 54 (L) 09/16/2013 0438   GFRAA 59 (L) 10/07/2019 1305   GFRAA >60 09/16/2013 0438    No results found for: SPEP, UPEP  Lab Results  Component Value Date   WBC 4.7 01/06/2020   NEUTROABS 2.6 01/06/2020   HGB 12.0 (L) 01/06/2020   HCT 33.8 (L) 01/06/2020   MCV 87.3 01/06/2020   PLT 192 01/06/2020      Chemistry      Component Value Date/Time   NA 138 01/06/2020 1252   NA 140 09/16/2013 0438   K 3.7 01/06/2020 1252   K 4.2 09/16/2013 0438   CL 102 01/06/2020 1252   CL 109 (H) 09/16/2013 0438   CO2 29 01/06/2020 1252   CO2 24 09/16/2013 0438   BUN 22 01/06/2020 1252   BUN 20 (H) 09/16/2013 0438   CREATININE 1.25 (H) 01/06/2020 1252   CREATININE 1.28 09/16/2013 0438      Component Value Date/Time   CALCIUM 9.4  01/06/2020 1252   CALCIUM 8.3 (L) 09/16/2013 0438   ALKPHOS 46 01/06/2020 1252   ALKPHOS 44 (L) 09/15/2013 0755   AST 24 01/06/2020 1252   AST 37 09/15/2013 0755   ALT 8 01/06/2020 1252   ALT 28 09/15/2013 0755   BILITOT 0.9 01/06/2020 1252   BILITOT 0.5 09/15/2013 0755       RADIOGRAPHIC STUDIES: I have personally reviewed the radiological images as listed and agreed with the findings in the report. No results found.   ASSESSMENT & PLAN:  Malignant neoplasm of prostate ([place]) # Castrate sensitive-metastatic prostate cancer to the bone. Stage IV-on Eligard; Bone scan October 2020-stable positive response. AUG  2021- PSA- <0.01.  STABLE.   #Currently off Zytiga secondary to poor tolerance.  Await PSA from today; if rising could consider alternative therapy.   #Iron deficient anemia/CKD- III-[s/p EGD March 2021]-continue p.o. iron.  Hold Venofer.  Hemoglobin 12 stable.  # Dizzy spells- ? Seizures [s/p EEG; Dr.Shah]; MRI brain-p. ? Relative hypotension-see below  # A. fib paroxysmal-sinus rhythm; aspirin Plavix-  STABLE/ relative hypotension- ? DC chlorthalidone; ? Cut dose for amlodipine. Wants to defer to cards.    # DISPOSITION: Call PSA # Eligard today; HOLD Venofer # Follow up in 3 months 2021- labs-cbc/cmp/PSA; eligard; possible venofer- Dr.B  Cc; Dr.Scott/Dr.Arida.     Orders Placed This Encounter  Procedures  . Ferritin    Standing Status:   Future    Number of Occurrences:   1    Standing Expiration Date:   01/05/2021  . Iron and TIBC    Standing Status:   Future    Number of Occurrences:   1    Standing Expiration Date:   01/05/2021  . CBC with Differential    Standing Status:   Future    Standing Expiration Date:   01/05/2021  . Comprehensive metabolic panel    Standing Status:   Future    Standing Expiration Date:   01/05/2021  . PSA    Standing Status:   Future    Standing Expiration Date:   01/05/2021   All questions were answered. The patient knows to  call the clinic with any problems, questions or concerns.      Cammie Sickle, MD 01/06/2020 2:10 PM

## 2020-01-08 ENCOUNTER — Telehealth: Payer: Self-pay | Admitting: Internal Medicine

## 2020-01-08 LAB — PROSTATE-SPECIFIC AG, SERUM (LABCORP): Prostate Specific Ag, Serum: <0.1 ng/mL (ref 0.0–4.0)

## 2020-01-08 NOTE — Telephone Encounter (Signed)
On 11/04-updated patient of PSA.  Hold off Zytiga at this time.  Continue monitoring of therapy.

## 2020-01-15 ENCOUNTER — Other Ambulatory Visit: Payer: Self-pay

## 2020-01-15 ENCOUNTER — Ambulatory Visit
Admission: RE | Admit: 2020-01-15 | Discharge: 2020-01-15 | Disposition: A | Discharge status: Home / Self Care | Payer: Medicare HMO | Source: Ambulatory Visit | Attending: Neurology | Admitting: Neurology

## 2020-01-15 DIAGNOSIS — R569 Unspecified convulsions: Secondary | ICD-10-CM | POA: Diagnosis not present

## 2020-01-21 NOTE — Progress Notes (Signed)
MRN : [id number]  Nathaniel Thompson is a 84 y.o. (November 12, 1935) male who presents with chief complaint of No chief complaint on file. [name]  History of Present Illness:   The patient presents to the office for evaluation of an abdominal aortic aneurysm. The aneurysm was found incidentally byplain films for low back pain. Patient denies abdominal pain or unusual back pain, no other abdominal complaints.He does have a bony met from his prostate cancer at L3.No history of an acute onset of painful blue discoloration of the toes.   No family history of AAA.   Patient denies amaurosis fugax or TIA symptoms. There is no history of claudication or rest pain symptoms of the lower extremities.He does have pain in the right leg when he walks but it radiates from posterior to laterally down the leg and occurs with laying down in bed tooThe patient denies angina or shortness of breath.  Duplex ultrasound of the abdominal aorta obtained today demonstrates the infrarenal abdominal aorta is 2.7 cm.  The previous aortic duplex ultrasound dated 01/24/2019 demonstrated a infrarenal abdominal aortic aneurysm with a maximal diameter of 3.3 cm  Carotid duplex RICA 35-32% and LICA<30% there is no significant change on today's study compared to previous.  Popliteal arteries measure Rt=0.98 cm  and Lt=0.98 cm  (previous study last year  Rt=1.28 cm  and Lt=1.03 cm)  No outpatient medications have been marked as taking for the 01/22/20 encounter (Appointment) with Delana Meyer, Dolores Lory, MD.    Past Medical History:  Diagnosis Date  . (HFpEF) heart failure with preserved ejection fraction ([place])    a. 05/2018 Echo: Nl EF, Gr1 DD, sev dil LA, mild MR/TR, mild PAH; b. 05/2019 Echo: EF 55-60%, no rwma, mild LVH. Nl PASP. Mildly dil LA. Triv MR. Mild AoV sclerosis w/o stenosis. Mildly dil Asc AO (68m).  . Allergic rhinitis   . Barrett's esophagus 10/21/13  . Bone cancer ([place]   . CKD (chronic kidney disease), stage  III ([place]   . Colon polyp, hyperplastic   . COPD (chronic obstructive pulmonary disease) (HCC)    mild COPD. former smoker  . Coronary artery disease    a. 2003 s/p PCI ([facility]); b. 2004 s/p PCI x 2 ([facility]); c. 11/2017 Cath: LM nl, LAD mild dzs, LCX patent stent w/ 30% distal edge restenosis, RCA patent distal stent w/ 30% prox edge stenosis. Nl EF->Med Rx.  . Diverticulosis   . Fundic gland polyps of stomach, benign   . Gastritis   . GERD (gastroesophageal reflux disease)   . Hypercholesteremia   . Hypertension   . Prostate cancer ([place]   . Prostatism   . Reflux esophagitis   . Renal stones   . Skin cancer    left cheek/lesion excised  . Sleep apnea   . Tachycardia    a. 11/2017 admit w/ tachycardia/? afib-->no afib noted upon review (amio/OAC d/c'd).  .Marland KitchenTIA (transient ischemic attack)     Past Surgical History:  Procedure Laterality Date  . CARDIAC CATHETERIZATION    . COLONOSCOPY WITH PROPOFOL N/A 09/13/2015   Procedure: COLONOSCOPY WITH PROPOFOL;  Surgeon: MLollie Sails MD;  [facility];  Service: Endoscopy;  Laterality: N/A;  . COLONOSCOPY WITH PROPOFOL N/A 05/07/2019   Procedure: COLONOSCOPY WITH PROPOFOL;  Surgeon: BRobert Bellow MD;  Location: [facility] ENDOSCOPY;  Service: Endoscopy;  Laterality: N/A;  . CORONARY ANGIOPLASTY    . ESOPHAGOGASTRODUODENOSCOPY (EGD) WITH PROPOFOL N/A 09/13/2015   Procedure: ESOPHAGOGASTRODUODENOSCOPY (EGD) WITH PROPOFOL;  Surgeon: Lollie Sails, MD;  Location: [facility] ENDOSCOPY;  Service: Endoscopy;  Laterality: N/A;  . ESOPHAGOGASTRODUODENOSCOPY (EGD) WITH PROPOFOL N/A 12/28/2015   Procedure: ESOPHAGOGASTRODUODENOSCOPY (EGD) WITH PROPOFOL;  Surgeon: Lollie Sails, MD;  [facility]COPY;  Service: Endoscopy;  Laterality: N/A;  . ESOPHAGOGASTRODUODENOSCOPY (EGD) WITH PROPOFOL N/A 06/16/2016   Procedure: ESOPHAGOGASTRODUODENOSCOPY (EGD) WITH PROPOFOL;  Surgeon: Lollie Sails, MD;  Location: [facility] ENDOSCOPY;  Service:  Endoscopy;  Laterality: N/A;  . ESOPHAGOGASTRODUODENOSCOPY (EGD) WITH PROPOFOL N/A 05/07/2019   Procedure: ESOPHAGOGASTRODUODENOSCOPY (EGD) WITH PROPOFOL;  Surgeon: Robert Bellow, MD;  Location: [facility] ENDOSCOPY;  Service: Endoscopy;  Laterality: N/A;  . EYE SURGERY  2013   CATARACT EXTRACTION  . GANGLION CYST EXCISION Right 05/18/2017   Procedure: REMOVAL GANGLION CYST ANKLE;  Surgeon: Samara Deist, DPM;  Location: [facility] ORS;  Service: Podiatry;  Laterality: Right;  . heart cath stent    . LEFT HEART CATH AND CORONARY ANGIOGRAPHY Right 12/03/2017   Procedure: Left Heart Cath and Coronary Angiography with possible coronary intervention;  Surgeon: Dionisio David, MD;  Location: [place] CV LAB;  Service: Cardiovascular;  Laterality: Right;  . PROSTATE BIOPSY    . stents     multiple    Social History Social History   Tobacco Use  . Smoking status: Former Smoker    Packs/day: 1.00    Years: 35.00    Pack years: 35.00    Types: Cigarettes    Quit date: 03/06/1986    Years since quitting: 33.9  . Smokeless tobacco: Former Systems developer    Types: Secondary school teacher  . Vaping Use: Never used  Substance Use Topics  . Alcohol use: No  . Drug use: No    Family History Family History  Problem Relation Age of Onset  . Cancer Mother        gastric and lung  . Cancer Father        multiple myeloma  . Stroke Father   . Cancer Sister        leukemia  . Cancer Brother        leukemia  . Cancer Brother        kidney  . Cancer Daughter        Uterine  . Cancer Other        [place] Son): Prostate    Allergies  Allergen Reactions  . Atorvastatin Other (See Comments)    Achy joints     REVIEW OF SYSTEMS (Negative unless checked)  Constitutional: '[]' Weight loss  '[]' Fever  '[]' Chills Cardiac: '[]' Chest pain   '[]' Chest pressure   '[]' Palpitations   '[]' Shortness of breath when laying flat   '[]' Shortness of breath with exertion. Vascular:  '[x]' Pain in legs with walking   '[]' Pain in legs  at rest  '[]' History of DVT   '[]' Phlebitis   '[]' Swelling in legs   '[]' Varicose veins   '[]' Non-healing ulcers Pulmonary:   '[]' Uses home oxygen   '[]' Productive cough   '[]' Hemoptysis   '[]' Wheeze  '[]' COPD   '[]' Asthma Neurologic:  '[]' Dizziness   '[]' Seizures   '[]' History of stroke   '[]' History of TIA  '[]' Aphasia   '[]' Vissual changes   '[]' Weakness or numbness in arm   '[]' Weakness or numbness in leg Musculoskeletal:   '[]' Joint swelling   '[x]' Joint pain   '[x]' Low back pain Hematologic:  '[]' Easy bruising  '[]' Easy bleeding   '[]' Hypercoagulable state   '[]' Anemic Gastrointestinal:  '[]' Diarrhea   '[]' Vomiting  '[]' Gastroesophageal reflux/heartburn   '[]' Difficulty swallowing. Genitourinary:  '[]' Chronic  kidney disease   '[]' Difficult urination  '[]' Frequent urination   '[]' Blood in urine Skin:  '[]' Rashes   '[]' Ulcers  Psychological:  '[]' History of anxiety   '[]'  History of major depression.  Physical Examination  There were no vitals filed for this visit. There is no height or weight on file to calculate BMI. Gen: WD/WN, NAD Head: [place]/AT, No temporalis wasting.  Ear/Nose/Throat: Hearing grossly intact, nares w/o erythema or drainage Eyes: PER, EOMI, sclera nonicteric.  Neck: Supple, no large masses.   Pulmonary:  Good air movement, no audible wheezing bilaterally, no use of accessory muscles.  Cardiac: RRR, no JVD Vascular:  No carotid bruit auscultated today Vessel Right Left  Radial Palpable Palpable  PT Palpable Palpable  DP Palpable Palpable  Gastrointestinal: Non-distended. No guarding/no peritoneal signs.  Musculoskeletal: M/S 5/5 throughout.  No deformity or atrophy.  Neurologic: CN 2-12 intact. Symmetrical.  Speech is fluent. Motor exam as listed above. Psychiatric: Judgment intact, Mood & affect appropriate for pt's clinical situation. Dermatologic: No rashes or ulcers noted.  No changes consistent with cellulitis.   CBC Lab Results  Component Value Date   WBC 4.7 01/06/2020   HGB 12.0 (L) 01/06/2020   HCT 33.8 (L) 01/06/2020    MCV 87.3 01/06/2020   PLT 192 01/06/2020    BMET    Component Value Date/Time   NA 138 01/06/2020 1252   NA 140 09/16/2013 0438   K 3.7 01/06/2020 1252   K 4.2 09/16/2013 0438   CL 102 01/06/2020 1252   CL 109 (H) 09/16/2013 0438   CO2 29 01/06/2020 1252   CO2 24 09/16/2013 0438   GLUCOSE 133 (H) 01/06/2020 1252   GLUCOSE 101 (H) 09/16/2013 0438   BUN 22 01/06/2020 1252   BUN 20 (H) 09/16/2013 0438   CREATININE 1.25 (H) 01/06/2020 1252   CREATININE 1.28 09/16/2013 0438   CALCIUM 9.4 01/06/2020 1252   CALCIUM 8.3 (L) 09/16/2013 0438   GFRNONAA 57 (L) 01/06/2020 1252   GFRNONAA 54 (L) 09/16/2013 0438   GFRAA 59 (L) 10/07/2019 1305   GFRAA >60 09/16/2013 0438   CrCl cannot be calculated (Unknown ideal weight.).  COAG No results found for: INR, PROTIME  Radiology MR BRAIN WO CONTRAST  Result Date: 01/15/2020 CLINICAL DATA:  Seizure-like activity. EXAM: MRI HEAD WITHOUT CONTRAST TECHNIQUE: Multiplanar, multiecho pulse sequences of the brain and surrounding structures were obtained without intravenous contrast. COMPARISON:  02/03/2019 CTA head.  10/01/2018 MRI head and prior. FINDINGS: Brain: No diffusion-weighted signal abnormality. Remote left parietal microhemorrhage. No midline shift, ventriculomegaly or extra-axial fluid collection. No mass lesion. Remote right frontal insult. Minimal chronic microvascular ischemic changes. Cerebral volume is within normal limits. The bilateral hippocampal complexes are symmetric demonstrating normal signal intensity and morphology. Vascular: Normal flow voids. Skull and upper cervical spine: Right TMJ osteoarthrosis with trace joint effusion. Sinuses/Orbits: Sequela of bilateral lens replacement. Small right maxillary sinus mucous retention cyst. Trace left mastoid effusion. Other: None. IMPRESSION: No acute intracranial process. Minimal chronic microvascular ischemic changes. Remote right frontal insult and remote left parietal microhemorrhage.  Right TMJ osteoarthrosis. Electronically Signed   By: Primitivo Gauze M.D.   On: 01/15/2020 10:32     Assessment/Plan 1. Abdominal aortic aneurysm (AAA) without rupture ([place]) No surgery or intervention at this time. The patient has an asymptomatic abdominal aortic aneurysm that is less than 4 cm in maximal diameter.  I have discussed the natural history of abdominal aortic aneurysm and the small risk of rupture for aneurysm less  than 5 cm in size.  However, as these small aneurysms tend to enlarge over time, continued surveillance with ultrasound or CT scan is mandatory.  I have also discussed optimizing medical management with hypertension and lipid control and the importance of abstinence from tobacco.  The patient is also encouraged to exercise a minimum of 30 minutes 4 times a week.  Should the patient develop new onset abdominal or back pain or signs of peripheral embolization they are instructed to seek medical attention immediately and to alert the physician providing care that they have an aneurysm.  The patient voices their understanding. The patient will return in 12 months with an aortic duplex.  - VAS US AORTA/IVC/ILIACS; Future  2. Popliteal artery aneurysm (HCC) There is no significant change on this year's study.  If anything his popliteal arteries seem somewhat smaller.  No intervention or further invasive testing is indicated at this time.  Follow up with duplex ultrasound in 2 years  3. Carotid artery disease, unspecified laterality, unspecified type ([place]) Recommend:  Given the patient's asymptomatic subcritical stenosis no further invasive testing or surgery at this time.  Duplex ultrasound shows <50% stenosis bilaterally.  Continue antiplatelet therapy as prescribed Continue management of CAD, HTN and Hyperlipidemia Healthy heart diet,  encouraged exercise at least 4 times per week Follow up in 12 months with duplex ultrasound and physical exam  - VAS US  CAROTID; Future  4. Hypertension, essential Continue antihypertensive medications as already ordered, these medications have been reviewed and there are no changes at this time.   5. Coronary artery disease involving native coronary artery of native heart without angina pectoris Continue cardiac and antihypertensive medications as already ordered and reviewed, no changes at this time.  Continue statin as ordered and reviewed, no changes at this time  Nitrates PRN for chest pain     Hortencia Pilar, MD  01/21/2020 11:27 AM

## 2020-01-22 ENCOUNTER — Ambulatory Visit (INDEPENDENT_AMBULATORY_CARE_PROVIDER_SITE_OTHER): Payer: Medicare HMO | Admitting: Vascular Surgery

## 2020-01-22 ENCOUNTER — Other Ambulatory Visit: Payer: Self-pay

## 2020-01-22 ENCOUNTER — Ambulatory Visit (INDEPENDENT_AMBULATORY_CARE_PROVIDER_SITE_OTHER): Payer: Medicare HMO

## 2020-01-22 ENCOUNTER — Encounter (INDEPENDENT_AMBULATORY_CARE_PROVIDER_SITE_OTHER): Payer: Self-pay | Admitting: Vascular Surgery

## 2020-01-22 ENCOUNTER — Other Ambulatory Visit (INDEPENDENT_AMBULATORY_CARE_PROVIDER_SITE_OTHER): Payer: Self-pay | Admitting: Vascular Surgery

## 2020-01-22 VITALS — BP 137/76 | HR 63 | Ht 69.0 in | Wt 214.0 lb

## 2020-01-22 DIAGNOSIS — I724 Aneurysm of artery of lower extremity: Secondary | ICD-10-CM

## 2020-01-22 DIAGNOSIS — I779 Disorder of arteries and arterioles, unspecified: Secondary | ICD-10-CM

## 2020-01-22 DIAGNOSIS — I714 Abdominal aortic aneurysm, without rupture, unspecified: Secondary | ICD-10-CM

## 2020-01-22 DIAGNOSIS — I2510 Atherosclerotic heart disease of native coronary artery without angina pectoris: Secondary | ICD-10-CM

## 2020-01-22 DIAGNOSIS — I10 Essential (primary) hypertension: Secondary | ICD-10-CM

## 2020-02-05 ENCOUNTER — Ambulatory Visit: Payer: Medicare HMO | Admitting: Cardiovascular Disease

## 2020-02-05 ENCOUNTER — Other Ambulatory Visit: Payer: Self-pay

## 2020-02-05 ENCOUNTER — Encounter: Payer: Self-pay | Admitting: Cardiovascular Disease

## 2020-02-05 VITALS — BP 110/66 | HR 80 | Ht 69.5 in | Wt 215.0 lb

## 2020-02-05 DIAGNOSIS — E785 Hyperlipidemia, unspecified: Secondary | ICD-10-CM | POA: Diagnosis not present

## 2020-02-05 DIAGNOSIS — I4891 Unspecified atrial fibrillation: Secondary | ICD-10-CM

## 2020-02-05 DIAGNOSIS — I714 Abdominal aortic aneurysm, without rupture, unspecified: Secondary | ICD-10-CM

## 2020-02-05 DIAGNOSIS — I10 Essential (primary) hypertension: Secondary | ICD-10-CM | POA: Diagnosis not present

## 2020-02-05 DIAGNOSIS — I2510 Atherosclerotic heart disease of native coronary artery without angina pectoris: Secondary | ICD-10-CM

## 2020-02-05 NOTE — Patient Instructions (Signed)
Medication Instructions:  No change in medications *If you need a refill on your cardiac medications before your next appointment, please call your pharmacy*   Lab Work: None If you have labs (blood work) drawn today and your tests are completely normal, you will receive your results only by:  [place] (if you have MyChart) OR  A paper copy in the mail If you have any lab test that is abnormal or we need to change your treatment, we will call you to review the results.   Testing/Procedures: None   Follow-Up: At [facility], you and your health needs are our priority.  As part of our continuing mission to provide you with exceptional heart care, we have created designated Provider Care Teams.  These Care Teams include your primary Cardiologist (physician) and Advanced Practice Providers (APPs -  Physician Assistants and Nurse Practitioners) who all work together to provide you with the care you need, when you need it.  We recommend signing up for the patient portal called "MyChart".  Sign up information is provided on this After Visit Summary.  MyChart is used to connect with patients for Virtual Visits (Telemedicine).  Patients are able to view lab/test results, encounter notes, upcoming appointments, etc.  Non-urgent messages can be sent to your provider as well.   To learn more about what you can do with MyChart, go to [email or web address].    Your next appointment:   6 month(s)  The format for your next appointment:   In Person  Provider:   You may see Kathlyn Sacramento, MD or one of the following Advanced Practice Providers on your designated Care Team:    Murray Hodgkins, NP  Christell Faith, PA-C  Marrianne Mood, PA-C  [facility], [place]  Laurann Montana, NP    Other Instructions N/A

## 2020-02-05 NOTE — Progress Notes (Signed)
Cardiology Office Note   Date:  02/05/2020   ID:  Nathaniel Thompson, DOB Feb 05, 1936, MRN [id number]  PCP:  Einar Pheasant, MD  Cardiologist:   Kathlyn Sacramento, MD   Chief Complaint  Patient presents with   office visit    6 month F/U; Meds evrbally reviewed with patient.      History of Present Illness: Nathaniel Thompson is a 84 y.o. male who presents for a follow-up visit regarding coronary artery disease.   He has known history of coronary artery disease status post stenting of the left circumflex and RCA.  He had initial PCI done in 2003 at [facility].  He had repeat PCI x2 at [facility] in 2004.  Most recent cardiac catheterization in 2019 was personally reviewed by me and showed patent left circumflex stent with 30% distal edge restenosis, mild LAD disease, patent distal RCA stent with 30% proximal stenosis.  Ejection fraction was normal. He quit smoking in the 80s.  He reports having TIA in the past.  Other medical problems include metastatic prostate cancer, small abdominal aortic aneurysm followed by vascular surgery, hyperlipidemia and essential hypertension. He was hospitalized in September 2019 with questionable tachycardia and atrial fibrillation.  Initially was placed on amiodarone and anticoagulation but all his EKGs revealed sinus rhythm at that time and thus both amiodarone and Eliquis were discontinued.  No recurrent arrhythmia since then.    Echocardiogram in March 2020 showed normal LV systolic function with grade 1 diastolic dysfunction, severely dilated left atrium, mild mitral and tricuspid regurgitation and mild pulmonary hypertension.  He has been doing reasonably well with no recent chest pain.  He has stable exertional dyspnea.  He reports intermittent episodes of dizziness mostly when he was on Lupron injection.  Most recent PSA was undetectable.    Past Medical History:  Diagnosis Date   (HFpEF) heart failure with preserved ejection fraction ([place])    a. 05/2018  Echo: Nl EF, Gr1 DD, sev dil LA, mild MR/TR, mild PAH; b. 05/2019 Echo: EF 55-60%, no rwma, mild LVH. Nl PASP. Mildly dil LA. Triv MR. Mild AoV sclerosis w/o stenosis. Mildly dil Asc AO (58mm).   Allergic rhinitis    Barrett's esophagus 10/21/13   Bone cancer (HCC)    CKD (chronic kidney disease), stage III (HCC)    Colon polyp, hyperplastic    COPD (chronic obstructive pulmonary disease) (HCC)    mild COPD. former smoker   Coronary artery disease    a. 2003 s/p PCI ([facility]); b. 2004 s/p PCI x 2 ([facility]); c. 11/2017 Cath: LM nl, LAD mild dzs, LCX patent stent w/ 30% distal edge restenosis, RCA patent distal stent w/ 30% prox edge stenosis. Nl EF->Med Rx.   Diverticulosis    Fundic gland polyps of stomach, benign    Gastritis    GERD (gastroesophageal reflux disease)    Hypercholesteremia    Hypertension    Prostate cancer ([place])    Prostatism    Reflux esophagitis    Renal stones    Skin cancer    left cheek/lesion excised   Sleep apnea    Tachycardia    a. 11/2017 admit w/ tachycardia/? afib-->no afib noted upon review (amio/OAC d/c'd).   TIA (transient ischemic attack)     Past Surgical History:  Procedure Laterality Date   CARDIAC CATHETERIZATION     COLONOSCOPY WITH PROPOFOL N/A 09/13/2015   Procedure: COLONOSCOPY WITH PROPOFOL;  Surgeon: Lollie Sails, MD;  Location: [facility] ENDOSCOPY;  Service:  Endoscopy;  Laterality: N/A;   COLONOSCOPY WITH PROPOFOL N/A 05/07/2019   Procedure: COLONOSCOPY WITH PROPOFOL;  Surgeon: Robert Bellow, MD;  Location: [facility] ENDOSCOPY;  Service: Endoscopy;  Laterality: N/A;   CORONARY ANGIOPLASTY     ESOPHAGOGASTRODUODENOSCOPY (EGD) WITH PROPOFOL N/A 09/13/2015   Procedure: ESOPHAGOGASTRODUODENOSCOPY (EGD) WITH PROPOFOL;  Surgeon: Lollie Sails, MD;  Location[facility] ENDOSCOPY;  Service: Endoscopy;  Laterality: N/A;   ESOPHAGOGASTRODUODENOSCOPY (EGD) WITH PROPOFOL N/A 12/28/2015   Procedure: ESOPHAGOGASTRODUODENOSCOPY (EGD)  WITH PROPOFOL;  Surgeon: Lollie Sails, MD;  Location: [facility] ENDOSCOPY;  Service: Endoscopy;  Laterality: N/A;   ESOPHAGOGASTRODUODENOSCOPY (EGD) WITH PROPOFOL N/A 06/16/2016   Procedure: ESOPHAGOGASTRODUODENOSCOPY (EGD) WITH PROPOFOL;  Surgeon: Lollie Sails, MD;  Location: [facility] ENDOSCOPY;  Service: Endoscopy;  Laterality: N/A;   ESOPHAGOGASTRODUODENOSCOPY (EGD) WITH PROPOFOL N/A 05/07/2019   Procedure: ESOPHAGOGASTRODUODENOSCOPY (EGD) WITH PROPOFOL;  Surgeon: Robert Bellow, MD;  Location: [facility] ENDOSCOPY;  Service: Endoscopy;  Laterality: N/A;   EYE SURGERY  2013   CATARACT EXTRACTION   GANGLION CYST EXCISION Right 05/18/2017   Procedure: REMOVAL GANGLION CYST ANKLE;  Surgeon: Samara Deist, DPM;  Location: [facility] ORS;  Service: Podiatry;  Laterality: Right;   heart cath stent     LEFT HEART CATH AND CORONARY ANGIOGRAPHY Right 12/03/2017   Procedure: Left Heart Cath and Coronary Angiography with possible coronary intervention;  Surgeon: Dionisio David, MD;  Location: [place] CV LAB;  Service: Cardiovascular;  Laterality: Right;   PROSTATE BIOPSY     stents     multiple     Current Outpatient Medications  Medication Sig Dispense Refill   amLODipine (NORVASC) 10 MG tablet TAKE 1 TABLET BY MOUTH ONCE DAILY 90 tablet 1   aspirin EC 81 MG tablet Take 81 mg by mouth daily.     calcium citrate-vitamin D (CITRACAL+D) 315-200 MG-UNIT tablet Take 1 tablet by mouth daily.     chlorthalidone (HYGROTON) 25 MG tablet Take 0.5 tablets (12.5 mg total) by mouth daily. 30 tablet 1   cholecalciferol (VITAMIN D) 1000 units tablet Take 1,000 Units by mouth daily.      clopidogrel (PLAVIX) 75 MG tablet Take 1 tablet (75 mg total) by mouth daily. 90 tablet 3   ezetimibe (ZETIA) 10 MG tablet Take 1 tablet (10 mg total) by mouth daily. 90 tablet 2   ferrous sulfate 325 (65 FE) MG tablet Take by mouth.     fexofenadine (ALLEGRA) 180 MG tablet Take 180 mg by mouth daily as needed for  allergies or rhinitis.      fluticasone (FLONASE) 50 MCG/ACT nasal spray Place 2 sprays into both nostrils daily.      isosorbide mononitrate (IMDUR) 30 MG 24 hr tablet TAKE 1 TABLET BY MOUTH ONCE DAILY 90 tablet 2   losartan (COZAAR) 50 MG tablet Take 1 tablet (50 mg total) by mouth daily. 90 tablet 1   lovastatin (MEVACOR) 40 MG tablet TAKE 2 TABLETS BY MOUTH AT BEDTIME 180 tablet 1   Multiple Vitamin (MULTI-VITAMINS) TABS Take 1 tablet by mouth daily.      nitroGLYCERIN (NITROSTAT) 0.4 MG SL tablet Place 1 tablet (0.4 mg total) under the tongue every 5 (five) minutes as needed for chest pain. 25 tablet 0   pantoprazole (PROTONIX) 40 MG tablet Take 40 mg by mouth 2 (two) times daily.      No current facility-administered medications for this visit.    Allergies:   Atorvastatin    Social History:  The patient  reports  that he quit smoking about 33 years ago. His smoking use included cigarettes. He has a 35.00 pack-year smoking history. He has quit using smokeless tobacco.  His smokeless tobacco use included chew. He reports that he does not drink alcohol and does not use drugs.   Family History:  The patient's family history includes Cancer in his brother, brother, daughter, father, mother, sister, and another family member; Stroke in his father.    ROS:  Please see the history of present illness.   Otherwise, review of systems are positive for none.   All other systems are reviewed and negative.    PHYSICAL EXAM: VS:  BP 110/66 (BP Location: Left Arm, Patient Position: Sitting, Cuff Size: Large)    Pulse 80    Ht 5' 9.5" (1.765 m)    Wt 215 lb (97.5 kg)    SpO2 98%    BMI 31.29 kg/m  , BMI Body mass index is 31.29 kg/m. GEN: Well nourished, well developed, in no acute distress  HEENT: normal  Neck: no JVD, carotid bruits, or masses Cardiac: RRR; no murmurs, rubs, or gallops,no edema  Respiratory:  clear to auscultation bilaterally, normal work of breathing GI: soft,  nontender, nondistended, + BS MS: no deformity or atrophy  Skin: warm and dry, no rash Neuro:  Strength and sensation are intact Psych: euthymic mood, full affect   EKG:  EKG is ordered today. The ekg ordered today demonstrates normal sinus rhythm with PACs and nonspecific T wave changes    Recent Labs: 03/31/2019: TSH 1.82 01/06/2020: ALT 8; BUN 22; Creatinine, Ser 1.25; Hemoglobin 12.0; Platelets 192; Potassium 3.7; Sodium 138    Lipid Panel    Component Value Date/Time   CHOL 117 12/08/2019 0802   CHOL 103 09/16/2013 0438   TRIG 111.0 12/08/2019 0802   TRIG 95 09/16/2013 0438   HDL 33.60 (L) 12/08/2019 0802   HDL 30 (L) 09/16/2013 0438   CHOLHDL 3 12/08/2019 0802   VLDL 22.2 12/08/2019 0802   VLDL 19 09/16/2013 0438   LDLCALC 61 12/08/2019 0802   LDLCALC 54 09/16/2013 0438      Wt Readings from Last 3 Encounters:  02/05/20 215 lb (97.5 kg)  01/22/20 214 lb (97.1 kg)  01/06/20 214 lb (97.1 kg)       PAD Screen 04/15/2019  Previous PAD dx? No  Previous surgical procedure? No  Pain with walking? No  Feet/toe relief with dangling? No  Painful, non-healing ulcers? No  Extremities discolored? No      ASSESSMENT AND PLAN:  1.  Coronary artery disease involving native coronary arteries without angina: He is overall doing reasonably well with no anginal symptoms.  He continues to be on dual antiplatelet therapy as tolerated given that he likely had first-generation drug-eluting stents.  2.  Chronic diastolic heart failure: He appears to be euvolemic and blood pressure is controlled.  3.  Essential hypertension: Blood pressure is controlled on current medications.    4.  Hyperlipidemia: Currently on lovastatin and Zetia.  History of intolerance to potent statins.  Most recent lipid profile showed an LDL of 61.  5.  Small abdominal aortic aneurysm: Less than 4 cm.  Followed by vascular surgery.   Disposition:   FU with me in 6 months  Signed,  Kathlyn Sacramento,  MD  02/05/2020 10:11 AM    [place]

## 2020-02-13 NOTE — Addendum Note (Signed)
Addended by: Raelene Bott, Shalan Neault L on: 02/13/2020 01:10 PM   Modules accepted: Orders

## 2020-03-01 ENCOUNTER — Other Ambulatory Visit: Payer: Self-pay

## 2020-03-01 ENCOUNTER — Telehealth: Payer: Self-pay

## 2020-03-01 ENCOUNTER — Telehealth: Payer: Self-pay | Admitting: Family Medicine

## 2020-03-01 ENCOUNTER — Ambulatory Visit
Admission: EM | Admit: 2020-03-01 | Discharge: 2020-03-01 | Disposition: A | Discharge status: Home / Self Care | Payer: Medicare HMO | Attending: Family Medicine | Admitting: Family Medicine

## 2020-03-01 ENCOUNTER — Telehealth: Payer: Medicare HMO | Admitting: Family Medicine

## 2020-03-01 DIAGNOSIS — B349 Viral infection, unspecified: Secondary | ICD-10-CM | POA: Diagnosis not present

## 2020-03-01 NOTE — Telephone Encounter (Signed)
Agree with need for evaluation. Given I am not in the office this week, please call and notify pt needs evaluation.  Thanks.

## 2020-03-01 NOTE — Telephone Encounter (Signed)
Delayed entry: [facility] called Sunday 12/26 indicating pt woke w/ chest congestion, temp to 101.  Fever came down w/ tylenol and chest congestion has improved by time of call.  Reviewed indications for ER assessment and told them that I would pass info along to PCP.  Suggested he be seen once office opened.

## 2020-03-01 NOTE — Telephone Encounter (Signed)
Patient is at [facility] today.       [place] Night - Cl TELEPHONE ADVICE RECORD [facility] Patient Name: Nathaniel Thompson Gender: Male DOB: 24-Nov-1935 Age: 84 Y 2 M 26 D Return Phone Number: [phone] (Primary) Address: City/State/Zip: [place] [address] Client [facility] Night - Cl Client Site [place] Physician Einar Pheasant - MD Contact Type Call Who Is Calling Patient / Member / Family / Caregiver Call Type Triage / Clinical Caller Name Hughie Martinz Relationship To Patient Spouse Return Phone Number [phone] (Primary) Chief Complaint Cough Reason for Call Symptomatic / Request for [place] states her husband woke up at 430am and couldn't catch his breath. He had a fever and EMTs did EKG and advised it wasn't his heart. They didn't recommend him going to ER. His temp was normal of 102. She is planning on going to [facility] tomorrow and wants to know if he can be COVID tested. He is coughing up some mucus now. Translation No Nurse Assessment Nurse: Jearld Pies, RN, Lovena Le Date/Time Eilene Ghazi Time): 02/29/2020 5:08:54 PM Confirm and document reason for call. If symptomatic, describe symptoms. ---Caller states her husband woke up at 430am and couldn't catch his breath. He had a fever and EMTs did EKG and advised it wasn't his heart. They didn't recommend him going to ER. His temp was normal of 101F this morning and gave fever reducer. Temp has not returned. 98.24F orally currently. Drinking and urinating normally. He is coughing up some mucus now. Denies difficulty breathing, chest pain, or any other symptoms at this time. Does the patient have any new or worsening symptoms? ---Yes Will a triage be completed? ---Yes Related visit to physician within the last 2 weeks? ---No Does the PT have any chronic conditions? (i.e.  diabetes, asthma, this includes High risk factors for pregnancy, etc.) ---Yes List chronic conditions. ---HTN Is this a behavioral health or substance abuse call? ---No PLEASE NOTE: All timestamps contained within this report are represented as [place] Standard Time. CONFIDENTIALTY NOTICE: This fax transmission is intended only for the addressee. It contains information that is legally privileged, confidential or otherwise protected from use or disclosure. If you are not the intended recipient, you are strictly prohibited from reviewing, disclosing, copying using or disseminating any of this information or taking any action in reliance on or regarding this information. If you have received this fax in error, please notify us immediately by telephone so that we can arrange for its return to [place]. Phone: [phone], Toll-Free: [phone], Fax: [phone] Page: 2 of 3 Call Id: [id number] Guidelines Guideline Title Affirmed Question Affirmed Notes Nurse Date/Time Eilene Ghazi Time) COVID-19 - Diagnosed or Suspected HIGH RISK for severe COVID complications (e.g., age > 56 years, obesity with BMI > 25, pregnant, chronic lung disease or other chronic medical condition) (Exception: Already seen by PCP and no new or worsening symptoms.) [name] 02/29/2020 5:12:30 PM Disp. Time Eilene Ghazi Time) Disposition Final User 02/29/2020 5:16:17 PM Paged On Call back to [facility], [place] 02/29/2020 5:16:39 PM Paged On Call back to [facility], [place], Lovena Le 02/29/2020 5:16:22 PM Call PCP Now Yes Jearld Pies, RN, Apolonio Schneiders Disagree/Comply Comply Caller Understands Yes PreDisposition Did not know what to do Care Advice Given Per Guideline * I'll page the on-call provider now. If you haven't heard from the provider (or me) within 30 minutes, call again. * You need to discuss this with  your doctor (or NP/PA). CALL PCP NOW: * Sore throat: Try throat lozenges, hard candy or warm  chicken broth. * Muscle aches, headache, and other pains: Often this comes and goes with the fever. Take acetaminophen every 4 to 6 hours (Adults 650 mg) OR ibuprofen every 6 to 8 hours (Adults 400 mg). Before taking any medicine, read all the instructions on the package. * Fever: For fever over 101 F (38.3 C), take acetaminophen every 4 to 6 hours (Adults 650 mg) OR ibuprofen every 6 to 8 hours (Adults 400 mg). Before taking any medicine, read all the instructions on the package. Do not take aspirin unless your doctor has prescribed it for you. * Feeling dehydrated: Drink extra liquids. If the air in your home is dry, use a humidifier. * Cough: Use cough drops. * The treatment is the same whether you have COVID-19, influenza or some other respiratory virus. GENERAL CARE ADVICE FOR COVID-19 SYMPTOMS: * They are over-the-counter (OTC) drugs that help treat both fever and pain. You can buy them at the drugstore. FEVER MEDICINES: * For fevers above 101 F (38.3 C) take either acetaminophen or ibuprofen. * The goal of fever therapy is to bring the fever down to a comfortable level. Remember that fever medicine usually lowers fever 2 degrees F (1 - 1 1/2 degrees C). * ACETAMINOPHEN REGULAR STRENGTH TYLENOL: Take 650 mg (two 325 mg pills) by mouth every 4-6 hours as needed. Each Regular Strength Tylenol pill has 325 mg of acetaminophen. The most you should take each day is 3,250 mg (10 pills a day). * You become worse CALL BACK IF: Comments User: Meade Maw, RN Date/Time (Eastern Time): 02/29/2020 5:12:27 PM Pt also on blood thinner and medication for high cholesterol. User: Meade Maw, RN Date/Time Lamount Cohen Time): 02/29/2020 5:15:42 PM Pt is fully vaccinated and has had booster. PLEASE NOTE: All timestamps contained within this report are represented as [place] Standard Time. CONFIDENTIALTY NOTICE: This fax transmission is intended only for the addressee. It contains information that is  legally privileged, confidential or otherwise protected from use or disclosure. If you are not the intended recipient, you are strictly prohibited from reviewing, disclosing, copying using or disseminating any of this information or taking any action in reliance on or regarding this information. If you have received this fax in error, please notify us immediately by telephone so that we can arrange for its return to [place]. Phone: [phone], Toll-Free: [phone], Fax: [phone] Page: 3 of 3 Call Id: [id number] Paging DoctorName Phone DateTime Result/Outcome Message Type Notes Lezlie Octave- MD [phone] 02/29/2020 5:16:17 PM Paged On Call Back to Call Center Doctor Paged Meade Maw RN @ [facility] CB # [phone]. Lezlie Octave- MD 02/29/2020 5:28:05 PM Spoke with On Call - General Message Result Advised "Pt needs to be seen. If not having chest pain/pressure, difficulty breathing, fever, or any new worsening symptoms pt can follow up with PCP in the morning upon opening. If symptoms worsen follow up at ED immediately. Caller (wife) made aware and verbalizes understanding

## 2020-03-01 NOTE — Discharge Instructions (Signed)
Flonase daily You can try allergy medicine Covid test pending.  Follow up as needed for continued or worsening symptoms

## 2020-03-01 NOTE — Telephone Encounter (Signed)
I spoke with wife, she states that she called on Sunday. She also spoke with our front desk today & was told we could only do a virtual visit. I notified her that Dr. Lorin Picket is out of the office this week & advised to go to Urgent Care or ER. She is taking him to the Urgent Care a soon as her finish getting dressed.

## 2020-03-01 NOTE — ED Provider Notes (Signed)
Nathaniel Thompson    CSN: [id number] Arrival date & time: 03/01/20  0901      History   Chief Complaint Chief Complaint  Patient presents with  . Cough    HPI Nathaniel Thompson is a 84 y.o. male.   Patient is an 84 year old male with a past medical history of heart failure, allergic rhinitis, Barrett's esophagus, bone cancer, CAD, COPD, CAD, diverticulosis, gastritis, GERD, hyper cholesterolemia, hypertension, prostate cancer, TIA, sleep apnea.  He presents today with cough, shortness of breath, fever and scratchy throat that started yesterday.  Symptoms have been constant.  Feels somewhat improved this morning.  EMS was called to house yesterday due to waking up with shortness of breath.  They did EKG and listen to his lungs and did not find anything concerning.  He is not having any chest pain or shortness of breath today.  No lower extremity edema.  Has had 3 Covid vaccines.     Past Medical History:  Diagnosis Date  . (HFpEF) heart failure with preserved ejection fraction ([place])    a. 05/2018 Echo: Nl EF, Gr1 DD, sev dil LA, mild MR/TR, mild PAH; b. 05/2019 Echo: EF 55-60%, no rwma, mild LVH. Nl PASP. Mildly dil LA. Triv MR. Mild AoV sclerosis w/o stenosis. Mildly dil Asc AO (80m).  . Allergic rhinitis   . Barrett's esophagus 10/21/13  . Bone cancer ([facility]   . CKD (chronic kidney disease), stage III ([facility]   . Colon polyp, hyperplastic   . COPD (chronic obstructive pulmonary disease) (HCC)    mild COPD. former smoker  . Coronary artery disease    a. 2003 s/p PCI ([facility]); b. 2004 s/p PCI x 2 ([facility]); c. 11/2017 Cath: LM nl, LAD mild dzs, LCX patent stent w/ 30% distal edge restenosis, RCA patent distal stent w/ 30% prox edge stenosis. Nl EF->Med Rx.  . Diverticulosis   . Fundic gland polyps of stomach, benign   . Gastritis   . GERD (gastroesophageal reflux disease)   . Hypercholesteremia   . Hypertension   . Prostate cancer ([facility]   . Prostatism   . Reflux esophagitis   .  Renal stones   . Skin cancer    left cheek/lesion excised  . Sleep apnea   . Tachycardia    a. 11/2017 admit w/ tachycardia/? afib-->no afib noted upon review (amio/OAC d/c'd).  .Marland KitchenTIA (transient ischemic attack)     Patient Active Problem List   Diagnosis Date Noted  . Foot pain 07/16/2019  . Nasal congestion 07/16/2019  . B12 deficiency 04/06/2019  . Iron deficiency anemia due to chronic blood loss 03/31/2019  . 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 GERD without esophagitis 12/30/2018  . History of coronary artery disease 12/30/2018  . Popliteal artery aneurysm ([place] 12/30/2018  . Abdominal aortic aneurysm (AAA) ([facility] 12/07/2018  . Anemia 12/07/2018  . Right hip pain 11/22/2018  . Atrial fibrillation, new onset ([facility] 11/27/2017  . Dizziness 08/12/2017  . Nasal erythema 04/10/2017  . Carotid artery disease ([facility] 08/06/2016  . History of TIA (transient ischemic attack) 07/24/2016  . Sleep apnea 07/24/2016  . COPD (chronic obstructive pulmonary disease) ([facility] 07/24/2016  . CKD (chronic kidney disease) 07/24/2016  . Barrett's esophagus 07/24/2016  . Hyperglycemia 07/24/2016  . Hypercholesterolemia 07/12/2016  . Hypertension, essential 07/12/2016  . CAD (coronary artery disease) 07/12/2016  . Malignant neoplasm of prostate ([facility] 11/15/2015  . Family history of cancer 11/15/2015  . Goals  of care, counseling/discussion 06/18/2014  . Acute rhinitis 02/12/2014  . S/P coronary artery stent placement 10/02/2013  . History of skin cancer 09/02/2013    Past Surgical History:  Procedure Laterality Date  . CARDIAC CATHETERIZATION    . COLONOSCOPY WITH PROPOFOL N/A 09/13/2015   Procedure: COLONOSCOPY WITH PROPOFOL;  Surgeon: Martin U Skulskie, MD;  Location: [facility] ENDOSCOPY;  Service: Endoscopy;  Laterality: N/A;  . COLONOSCOPY WITH PROPOFOL N/A 05/07/2019   Procedure: COLONOSCOPY WITH PROPOFOL;  Surgeon: Byrnett, Jeffrey W, MD;   Location: [facility] ENDOSCOPY;  Service: Endoscopy;  Laterality: N/A;  . CORONARY ANGIOPLASTY    . ESOPHAGOGASTRODUODENOSCOPY (EGD) WITH PROPOFOL N/A 09/13/2015   Procedure: ESOPHAGOGASTRODUODENOSCOPY (EGD) WITH PROPOFOL;  Surgeon: Martin U Skulskie, MD;  Location: [facility] ENDOSCOPY;  Service: Endoscopy;  Laterality: N/A;  . ESOPHAGOGASTRODUODENOSCOPY (EGD) WITH PROPOFOL N/A 12/28/2015   Procedure: ESOPHAGOGASTRODUODENOSCOPY (EGD) WITH PROPOFOL;  Surgeon: Martin U Skulskie, MD;  Location: [facility] ENDOSCOPY;  Service: Endoscopy;  Laterality: N/A;  . ESOPHAGOGASTRODUODENOSCOPY (EGD) WITH PROPOFOL N/A 06/16/2016   Procedure: ESOPHAGOGASTRODUODENOSCOPY (EGD) WITH PROPOFOL;  Surgeon: Martin U Skulskie, MD;  Location: [facility] ENDOSCOPY;  Service: Endoscopy;  Laterality: N/A;  . ESOPHAGOGASTRODUODENOSCOPY (EGD) WITH PROPOFOL N/A 05/07/2019   Procedure: ESOPHAGOGASTRODUODENOSCOPY (EGD) WITH PROPOFOL;  Surgeon: Byrnett, Jeffrey W, MD;  Location: [facility] ENDOSCOPY;  Service: Endoscopy;  Laterality: N/A;  . EYE SURGERY  2013   CATARACT EXTRACTION  . GANGLION CYST EXCISION Right 05/18/2017   Procedure: REMOVAL GANGLION CYST ANKLE;  Surgeon: Fowler, Justin, DPM;  Location: [facility] ORS;  Service: Podiatry;  Laterality: Right;  . heart cath stent    . LEFT HEART CATH AND CORONARY ANGIOGRAPHY Right 12/03/2017   Procedure: Left Heart Cath and Coronary Angiography with possible coronary intervention;  Surgeon: Khan, Shaukat A, MD;  Location: [facility] INVASIVE CV LAB;  Service: Cardiovascular;  Laterality: Right;  . PROSTATE BIOPSY    . stents     multiple       Home Medications    Prior to Admission medications   Medication Sig Start Date End Date Taking? Authorizing Provider  amLODipine (NORVASC) 10 MG tablet TAKE 1 TABLET BY MOUTH ONCE DAILY 10/27/19   Scott, Charlene, MD  aspirin EC 81 MG tablet Take 81 mg by mouth daily.    [provider]  calcium citrate-vitamin D (CITRACAL+D) 315-200 MG-UNIT tablet Take 1 tablet by  mouth daily.    [provider]  chlorthalidone (HYGROTON) 25 MG tablet Take 0.5 tablets (12.5 mg total) by mouth daily. 11/20/19   Scott, Charlene, MD  cholecalciferol (VITAMIN D) 1000 units tablet Take 1,000 Units by mouth daily.     [provider]  clopidogrel (PLAVIX) 75 MG tablet Take 1 tablet (75 mg total) by mouth daily. 07/24/19   Berge, Christopher Ronald, NP  ezetimibe (ZETIA) 10 MG tablet Take 1 tablet (10 mg total) by mouth daily. 07/16/19   Scott, Charlene, MD  ferrous sulfate 325 (65 FE) MG tablet Take by mouth.    [provider]  fexofenadine (ALLEGRA) 180 MG tablet Take 180 mg by mouth daily as needed for allergies or rhinitis.     [provider]  fluticasone (FLONASE) 50 MCG/ACT nasal spray Place 2 sprays into both nostrils daily.     [provider]  isosorbide mononitrate (IMDUR) 30 MG 24 hr tablet TAKE 1 TABLET BY MOUTH ONCE DAILY 10/27/19   Scott, Charlene, MD  losartan (COZAAR) 50 MG tablet Take 1 tablet (50 mg total) by mouth daily. 11/22/18     Scott, Charlene, MD  lovastatin (MEVACOR) 40 MG tablet TAKE 2 TABLETS BY MOUTH AT BEDTIME 06/27/19   Scott, Charlene, MD  Multiple Vitamin (MULTI-VITAMINS) TABS Take 1 tablet by mouth daily.     [provider]  nitroGLYCERIN (NITROSTAT) 0.4 MG SL tablet Place 1 tablet (0.4 mg total) under the tongue every 5 (five) minutes as needed for chest pain. 11/06/19   Berge, Christopher Ronald, NP  pantoprazole (PROTONIX) 40 MG tablet Take 40 mg by mouth 2 (two) times daily.     [provider]    Family History Family History  Problem Relation Age of Onset  . Cancer Mother        gastric and lung  . Cancer Father        multiple myeloma  . Stroke Father   . Cancer Sister        leukemia  . Cancer Brother        leukemia  . Cancer Brother        kidney  . Cancer Daughter        Uterine  . Cancer Other        [place] (Sister's Son): Prostate    Social History Social  History   Tobacco Use  . Smoking status: Former Smoker    Packs/day: 1.00    Years: 35.00    Pack years: 35.00    Types: Cigarettes    Quit date: 03/06/1986    Years since quitting: 34.0  . Smokeless tobacco: Former User    Types: Chew  Vaping Use  . Vaping Use: Never used  Substance Use Topics  . Alcohol use: No  . Drug use: No     Allergies   Atorvastatin   Review of Systems Review of Systems   Physical Exam Triage Vital Signs ED Triage Vitals  Enc Vitals Group     BP 03/01/20 0935 110/68     Pulse Rate 03/01/20 0935 76     Resp 03/01/20 0935 18     Temp 03/01/20 0935 98.2 F (36.8 C)     Temp Source 03/01/20 0935 Oral     SpO2 03/01/20 0935 95 %     Weight --      Height --      Head Circumference --      Peak Flow --      Pain Score 03/01/20 0931 0     Pain Loc --      Pain Edu? --      Excl. in GC? --    No data found.  Updated Vital Signs BP 110/68   Pulse 76   Temp 98.2 F (36.8 C) (Oral)   Resp 18   SpO2 95%   Visual Acuity Right Eye Distance:   Left Eye Distance:   Bilateral Distance:    Right Eye Near:   Left Eye Near:    Bilateral Near:     Physical Exam Vitals and nursing note reviewed.  Constitutional:      General: He is not in acute distress.    Appearance: Normal appearance. He is not ill-appearing, toxic-appearing or diaphoretic.  HENT:     Head: Normocephalic and atraumatic.     Right Ear: Tympanic membrane and ear canal normal.     Left Ear: Tympanic membrane and ear canal normal.     Nose: Nose normal.     Mouth/Throat:     Pharynx: Oropharynx is clear.  Eyes:     Conjunctiva/sclera: Conjunctivae normal.    Cardiovascular:     Rate and Rhythm: Normal rate and regular rhythm.  Pulmonary:     Effort: Pulmonary effort is normal.     Breath sounds: Normal breath sounds.  Musculoskeletal:        General: Normal range of motion.     Cervical back: Normal range of motion.  Skin:    General: Skin is warm and dry.   Neurological:     Mental Status: He is alert.  Psychiatric:        Mood and Affect: Mood normal.      UC Treatments / Results  Labs (all labs ordered are listed, but only abnormal results are displayed) Labs Reviewed - No data to display  EKG   Radiology No results found.  Procedures Procedures (including critical care time)  Medications Ordered in UC Medications - No data to display  Initial Impression / Assessment and Plan / UC Course  I have reviewed the triage vital signs and the nursing notes.  Pertinent labs & imaging results that were available during my care of the patient were reviewed by me and considered in my medical decision making (see chart for details).     Viral illness No concerns on exam today.  Vital signs normal Patient feeling better compared to yesterday. Covid test pending.  Recommended Flonase and antihistamine daily. Follow up as needed for continued or worsening symptoms  Final Clinical Impressions(s) / UC Diagnoses   Final diagnoses:  Viral illness     Discharge Instructions     Flonase daily You can try allergy medicine Covid test pending.  Follow up as needed for continued or worsening symptoms     ED Prescriptions    None     PDMP not reviewed this encounter.   Bast, Traci A, NP 03/01/20 0956  

## 2020-03-01 NOTE — ED Triage Notes (Signed)
Pt presents for SOB, cough, fever, scratchy throat starting yesterday.    Pt has had COVID vaccine x 3.

## 2020-03-03 LAB — NOVEL CORONAVIRUS, NAA: SARS-CoV-2, NAA: NOT DETECTED

## 2020-03-03 LAB — SARS-COV-2, NAA 2 DAY TAT

## 2020-03-29 ENCOUNTER — Encounter: Payer: Self-pay | Admitting: Dermatology

## 2020-03-30 ENCOUNTER — Emergency Department: Payer: Medicare HMO

## 2020-03-30 ENCOUNTER — Other Ambulatory Visit: Payer: Self-pay

## 2020-03-30 ENCOUNTER — Emergency Department
Admission: EM | Admit: 2020-03-30 | Discharge: 2020-03-30 | Disposition: A | Discharge status: Home / Self Care | Payer: Medicare HMO | Attending: Student in an Organized Health Care Education/Training Program | Admitting: Student in an Organized Health Care Education/Training Program

## 2020-03-30 DIAGNOSIS — I5033 Acute on chronic diastolic (congestive) heart failure: Secondary | ICD-10-CM | POA: Insufficient documentation

## 2020-03-30 DIAGNOSIS — I2510 Atherosclerotic heart disease of native coronary artery without angina pectoris: Secondary | ICD-10-CM | POA: Diagnosis not present

## 2020-03-30 DIAGNOSIS — Z8546 Personal history of malignant neoplasm of prostate: Secondary | ICD-10-CM | POA: Diagnosis not present

## 2020-03-30 DIAGNOSIS — Z7951 Long term (current) use of inhaled steroids: Secondary | ICD-10-CM | POA: Insufficient documentation

## 2020-03-30 DIAGNOSIS — Z85830 Personal history of malignant neoplasm of bone: Secondary | ICD-10-CM | POA: Insufficient documentation

## 2020-03-30 DIAGNOSIS — J449 Chronic obstructive pulmonary disease, unspecified: Secondary | ICD-10-CM | POA: Diagnosis not present

## 2020-03-30 DIAGNOSIS — Z87891 Personal history of nicotine dependence: Secondary | ICD-10-CM | POA: Diagnosis not present

## 2020-03-30 DIAGNOSIS — Z79899 Other long term (current) drug therapy: Secondary | ICD-10-CM | POA: Diagnosis not present

## 2020-03-30 DIAGNOSIS — Z7982 Long term (current) use of aspirin: Secondary | ICD-10-CM | POA: Diagnosis not present

## 2020-03-30 DIAGNOSIS — Z85828 Personal history of other malignant neoplasm of skin: Secondary | ICD-10-CM | POA: Diagnosis not present

## 2020-03-30 DIAGNOSIS — R42 Dizziness and giddiness: Secondary | ICD-10-CM | POA: Diagnosis present

## 2020-03-30 DIAGNOSIS — N183 Chronic kidney disease, stage 3 unspecified: Secondary | ICD-10-CM | POA: Diagnosis not present

## 2020-03-30 DIAGNOSIS — Z20822 Contact with and (suspected) exposure to covid-19: Secondary | ICD-10-CM | POA: Diagnosis not present

## 2020-03-30 DIAGNOSIS — I130 Hypertensive heart and chronic kidney disease with heart failure and stage 1 through stage 4 chronic kidney disease, or unspecified chronic kidney disease: Secondary | ICD-10-CM | POA: Diagnosis not present

## 2020-03-30 LAB — BASIC METABOLIC PANEL
Anion gap: 11 (ref 5–15)
BUN: 23 mg/dL (ref 8–23)
CO2: 26 mmol/L (ref 22–32)
Calcium: 9.4 mg/dL (ref 8.9–10.3)
Chloride: 103 mmol/L (ref 98–111)
Creatinine, Ser: 1.21 mg/dL (ref 0.61–1.24)
GFR, Estimated: 60 mL/min — ABNORMAL LOW (ref >60)
Glucose, Bld: 110 mg/dL — ABNORMAL HIGH (ref 70–99)
Potassium: 3.6 mmol/L (ref 3.5–5.1)
Sodium: 140 mmol/L (ref 135–145)

## 2020-03-30 LAB — URINALYSIS, COMPLETE (UACMP) WITH MICROSCOPIC
Bacteria, UA: NONE SEEN
Bilirubin Urine: NEGATIVE
Glucose, UA: NEGATIVE mg/dL
Hgb urine dipstick: NEGATIVE
Ketones, ur: NEGATIVE mg/dL
Leukocytes,Ua: NEGATIVE
Nitrite: NEGATIVE
Protein, ur: NEGATIVE mg/dL
Specific Gravity, Urine: 1.009 (ref 1.005–1.030)
Squamous Epithelial / HPF: NONE SEEN (ref 0–5)
pH: 7 (ref 5.0–8.0)

## 2020-03-30 LAB — CBC
HCT: 34.5 % — ABNORMAL LOW (ref 39.0–52.0)
Hemoglobin: 12 g/dL — ABNORMAL LOW (ref 13.0–17.0)
MCH: 30.4 pg (ref 26.0–34.0)
MCHC: 34.8 g/dL (ref 30.0–36.0)
MCV: 87.3 fL (ref 80.0–100.0)
Platelets: 190 10*3/uL (ref 150–400)
RBC: 3.95 MIL/uL — ABNORMAL LOW (ref 4.22–5.81)
RDW: 12.7 % (ref 11.5–15.5)
WBC: 5 10*3/uL (ref 4.0–10.5)
nRBC: 0 % (ref 0.0–0.2)

## 2020-03-30 LAB — HEPATIC FUNCTION PANEL
ALT: 16 U/L (ref 0–44)
AST: 23 U/L (ref 15–41)
Albumin: 3.6 g/dL (ref 3.5–5.0)
Alkaline Phosphatase: 47 U/L (ref 38–126)
Bilirubin, Direct: <0.1 mg/dL (ref 0.0–0.2)
Total Bilirubin: 0.8 mg/dL (ref 0.3–1.2)
Total Protein: 6.9 g/dL (ref 6.5–8.1)

## 2020-03-30 LAB — SARS CORONAVIRUS 2 (TAT 6-24 HRS): SARS Coronavirus 2: NEGATIVE

## 2020-03-30 LAB — TROPONIN I (HIGH SENSITIVITY)
Troponin I (High Sensitivity): 10 ng/L (ref <18)
Troponin I (High Sensitivity): 9 ng/L (ref <18)

## 2020-03-30 MED ORDER — SODIUM CHLORIDE 0.9 % IV BOLUS
500.0000 mL | Freq: Once | INTRAVENOUS | Status: AC
  Administered 2020-03-30: 500 mL via INTRAVENOUS

## 2020-03-30 NOTE — ED Notes (Signed)
Pt transported to MRI 

## 2020-03-30 NOTE — ED Triage Notes (Signed)
Pt arrives from home via [facility]. C/o of weak, feeling like he's going to fall over, swimmy head, arm tingling, constipation. Wife called EMS for "not acting right." told EMS that symptoms began at 3am. stroke 3 years ago on blood thinners for cardiac stents VSS  CBG 116

## 2020-03-30 NOTE — ED Provider Notes (Signed)
[facility] Emergency Department Provider Note    Event Date/Time   First MD Initiated Contact with Patient 03/30/20 [phone]     (approximate)  I have reviewed the triage vital signs and the nursing notes.   HISTORY  Chief Complaint Weakness    HPI Nathaniel Thompson is a 85 y.o. male the below listed past medical history presents to the ER for evaluation of "silly headedness "dizziness feeling like he is about to pass out and fall started middle the night.  States that he was walking to use the bathroom felt like he was going to fall very lightheaded like he had an unsteady gait.  Does reportedly have a history of stroke.  Did not actually fall or hit his head.  Denies any pain at this time.  No nausea or vomiting.  Denies any shortness of breath or chest pain.    Past Medical History:  Diagnosis Date  . (HFpEF) heart failure with preserved ejection fraction ([place])    a. 05/2018 Echo: Nl EF, Gr1 DD, sev dil LA, mild MR/TR, mild PAH; b. 05/2019 Echo: EF 55-60%, no rwma, mild LVH. Nl PASP. Mildly dil LA. Triv MR. Mild AoV sclerosis w/o stenosis. Mildly dil Asc AO (45m).  . Allergic rhinitis   . Barrett's esophagus 10/21/13  . Bone cancer ([place]   . CKD (chronic kidney disease), stage III ([place]   . Colon polyp, hyperplastic   . COPD (chronic obstructive pulmonary disease) (HCC)    mild COPD. former smoker  . Coronary artery disease    a. 2003 s/p PCI ([facility]); b. 2004 s/p PCI x 2 ([facility]); c. 11/2017 Cath: LM nl, LAD mild dzs, LCX patent stent w/ 30% distal edge restenosis, RCA patent distal stent w/ 30% prox edge stenosis. Nl EF->Med Rx.  . Diverticulosis   . Fundic gland polyps of stomach, benign   . Gastritis   . GERD (gastroesophageal reflux disease)   . Hypercholesteremia   . Hypertension   . Prostate cancer ([place]   . Prostatism   . Reflux esophagitis   . Renal stones   . Skin cancer    left cheek/lesion excised  . Sleep apnea   . Tachycardia    a. 11/2017  admit w/ tachycardia/? afib-->no afib noted upon review (amio/OAC d/c'd).  .Marland KitchenTIA (transient ischemic attack)    Family History  Problem Relation Age of Onset  . Cancer Mother        gastric and lung  . Cancer Father        multiple myeloma  . Stroke Father   . Cancer Sister        leukemia  . Cancer Brother        leukemia  . Cancer Brother        kidney  . Cancer Daughter        Uterine  . Cancer Other        [place]): Prostate   Past Surgical History:  Procedure Laterality Date  . CARDIAC CATHETERIZATION    . COLONOSCOPY WITH PROPOFOL N/A 09/13/2015   Procedure: COLONOSCOPY WITH PROPOFOL;  Surgeon: MLollie Sails MD;  [facility];  Service: Endoscopy;  Laterality: N/A;  . COLONOSCOPY WITH PROPOFOL N/A 05/07/2019   Procedure: COLONOSCOPY WITH PROPOFOL;  Surgeon: BRobert Bellow MD;  Location: [facility] ENDOSCOPY;  Service: Endoscopy;  Laterality: N/A;  . CORONARY ANGIOPLASTY    . ESOPHAGOGASTRODUODENOSCOPY (EGD) WITH PROPOFOL N/A 09/13/2015   Procedure: ESOPHAGOGASTRODUODENOSCOPY (EGD)  WITH PROPOFOL;  Surgeon: Lollie Sails, MD;  [facility] ENDOSCOPY;  Service: Endoscopy;  Laterality: N/A;  . ESOPHAGOGASTRODUODENOSCOPY (EGD) WITH PROPOFOL N/A 12/28/2015   Procedure: ESOPHAGOGASTRODUODENOSCOPY (EGD) WITH PROPOFOL;  Surgeon: Lollie Sails, MD;  Location: [facility]COPY;  Service: Endoscopy;  Laterality: N/A;  . ESOPHAGOGASTRODUODENOSCOPY (EGD) WITH PROPOFOL N/A 06/16/2016   Procedure: ESOPHAGOGASTRODUODENOSCOPY (EGD) WITH PROPOFOL;  Surgeon: Lollie Sails, MD;  [facility];  Service: Endoscopy;  Laterality: N/A;  . ESOPHAGOGASTRODUODENOSCOPY (EGD) WITH PROPOFOL N/A 05/07/2019   Procedure: ESOPHAGOGASTRODUODENOSCOPY (EGD) WITH PROPOFOL;  Surgeon: Robert Bellow, MD;  Location: [facility] ENDOSCOPY;  Service: Endoscopy;  Laterality: N/A;  . EYE SURGERY  2013   CATARACT EXTRACTION  . GANGLION CYST EXCISION Right 05/18/2017    Procedure: REMOVAL GANGLION CYST ANKLE;  Surgeon: Samara Deist, DPM;  Location: [facility] ORS;  Service: Podiatry;  Laterality: Right;  . heart cath stent    . LEFT HEART CATH AND CORONARY ANGIOGRAPHY Right 12/03/2017   Procedure: Left Heart Cath and Coronary Angiography with possible coronary intervention;  Surgeon: Dionisio David, MD;  Location: [facility] LAB;  Service: Cardiovascular;  Laterality: Right;  . PROSTATE BIOPSY    . stents     multiple   Patient Active Problem List   Diagnosis Date Noted  . Foot pain 07/16/2019  . Nasal congestion 07/16/2019  . B12 deficiency 04/06/2019  . Iron deficiency anemia due to chronic blood loss 03/31/2019  . 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 GERD without esophagitis 12/30/2018  . History of coronary artery disease 12/30/2018  . 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 Abdominal aortic aneurysm (AAA) ([place]) 12/07/2018  . Anemia 12/07/2018  . Right hip pain 11/22/2018  . Atrial fibrillation, new onset ([place]) 11/27/2017  . Dizziness 08/12/2017  . Nasal erythema 04/10/2017  . Carotid artery disease ([place]) 08/06/2016  . History of TIA (transient ischemic attack) 07/24/2016  . Sleep apnea 07/24/2016  . COPD (chronic obstructive pulmonary disease) ([place]) 07/24/2016  . CKD (chronic kidney disease) 07/24/2016  . Barrett's esophagus 07/24/2016  . Hyperglycemia 07/24/2016  . Hypercholesterolemia 07/12/2016  . Hypertension, essential 07/12/2016  . CAD (coronary artery disease) 07/12/2016  . Malignant neoplasm of prostate ([place]) 11/15/2015  . Family history of cancer 11/15/2015  . Goals of care, counseling/discussion 06/18/2014  . Acute rhinitis 02/12/2014  . S/P coronary artery stent placement 10/02/2013  . History of skin cancer 09/02/2013      Prior to Admission medications   Medication Sig Start Date End Date Taking? Authorizing Provider  amLODipine (NORVASC) 10 MG tablet TAKE  1 TABLET BY MOUTH ONCE DAILY 10/27/19   Einar Pheasant, MD  aspirin EC 81 MG tablet Take 81 mg by mouth daily.    [provider]  calcium citrate-vitamin D (CITRACAL+D) 315-200 MG-UNIT tablet Take 1 tablet by mouth daily.    [provider]  chlorthalidone (HYGROTON) 25 MG tablet Take 0.5 tablets (12.5 mg total) by mouth daily. 11/20/19   Einar Pheasant, MD  cholecalciferol (VITAMIN D) 1000 units tablet Take 1,000 Units by mouth daily.     [provider]  clopidogrel (PLAVIX) 75 MG tablet Take 1 tablet (75 mg total) by mouth daily. 07/24/19   Theora Gianotti, NP  ezetimibe (ZETIA) 10 MG tablet Take 1 tablet (10 mg total) by mouth daily. 07/16/19   Einar Pheasant, MD  ferrous sulfate 325 (65 FE) MG tablet Take by mouth.    [provider]  fexofenadine (ALLEGRA) 180 MG tablet Take 180 mg by mouth daily as needed for allergies or rhinitis.     [provider]  fluticasone (FLONASE) 50 MCG/ACT nasal spray Place 2 sprays into both nostrils daily.     [provider]  isosorbide mononitrate (IMDUR) 30 MG 24 hr tablet TAKE 1 TABLET BY MOUTH ONCE DAILY 10/27/19   Einar Pheasant, MD  losartan (COZAAR) 50 MG tablet Take 1 tablet (50 mg total) by mouth daily. 11/22/18   Einar Pheasant, MD  lovastatin (MEVACOR) 40 MG tablet TAKE 2 TABLETS BY MOUTH AT BEDTIME 06/27/19   Einar Pheasant, MD  Multiple Vitamin (MULTI-VITAMINS) TABS Take 1 tablet by mouth daily.     [provider]  nitroGLYCERIN (NITROSTAT) 0.4 MG SL tablet Place 1 tablet (0.4 mg total) under the tongue every 5 (five) minutes as needed for chest pain. 11/06/19   Theora Gianotti, NP  pantoprazole (PROTONIX) 40 MG tablet Take 40 mg by mouth 2 (two) times daily.     [provider]    Allergies Atorvastatin    Social History Social History   Tobacco Use  . Smoking status: Former Smoker    Packs/day: 1.00    Years: 35.00    Pack years: 35.00     Types: Cigarettes    Quit date: 03/06/1986    Years since quitting: 34.0  . Smokeless tobacco: Former Systems developer    Types: Secondary school teacher  . Vaping Use: Never used  Substance Use Topics  . Alcohol use: No  . Drug use: No    Review of Systems Patient denies headaches, rhinorrhea, blurry vision, numbness, shortness of breath, chest pain, edema, cough, abdominal pain, nausea, vomiting, diarrhea, dysuria, fevers, rashes or hallucinations unless otherwise stated above in HPI. ____________________________________________   PHYSICAL EXAM:  VITAL SIGNS: Vitals:   03/30/20 1200 03/30/20 1230  BP: 113/63 133/72  Pulse: 62 (!) 58  Resp: 14 19  Temp:    SpO2: 99% 96%    Constitutional: Alert and oriented.  Eyes: Conjunctivae are normal.  Head: Atraumatic. Nose: No congestion/rhinnorhea. Mouth/Throat: Mucous membranes are moist.   Neck: No stridor. Painless ROM.  Cardiovascular: Normal rate, regular rhythm. Grossly normal heart sounds.  Good peripheral circulation. Respiratory: Normal respiratory effort.  No retractions. Lungs CTAB. Gastrointestinal: Soft and nontender. No distention. No abdominal bruits. No CVA tenderness. Genitourinary: deferrd Musculoskeletal: No lower extremity tenderness nor edema.  No joint effusions. Neurologic:  CN- intact.  No facial droop, Normal FNF.  Normal heel to shin.  Sensation intact bilaterally. Normal speech and language. No gross focal neurologic deficits are appreciated. No gait instability. Skin:  Skin is warm, dry and intact. No rash noted. Psychiatric: Mood and affect are normal. Speech and behavior are normal.  ____________________________________________   LABS (all labs ordered are listed, but only abnormal results are displayed)  Results for orders placed or performed during the hospital encounter of 03/30/20 (from the past 24 hour(s))  Basic metabolic panel     Status: Abnormal   Collection Time: 03/30/20  8:24 AM  Result Value Ref Range    Sodium 140 135 - 145 mmol/L   Potassium 3.6 3.5 - 5.1 mmol/L   Chloride 103 98 - 111 mmol/L   CO2 26 22 - 32 mmol/L   Glucose, Bld 110 (H) 70 - 99 mg/dL   BUN 23 8 - 23 mg/dL   Creatinine, Ser 1.21 0.61 - 1.24 mg/dL   Calcium 9.4 8.9 -  10.3 mg/dL   GFR, Estimated 60 (L) >60 mL/min   Anion gap 11 5 - 15  CBC     Status: Abnormal   Collection Time: 03/30/20  8:24 AM  Result Value Ref Range   WBC 5.0 4.0 - 10.5 K/uL   RBC 3.95 (L) 4.22 - 5.81 MIL/uL   Hemoglobin 12.0 (L) 13.0 - 17.0 g/dL   HCT 34.5 (L) 39.0 - 52.0 %   MCV 87.3 80.0 - 100.0 fL   MCH 30.4 26.0 - 34.0 pg   MCHC 34.8 30.0 - 36.0 g/dL   RDW 12.7 11.5 - 15.5 %   Platelets 190 150 - 400 K/uL   nRBC 0.0 0.0 - 0.2 %  Urinalysis, Complete w Microscopic Urine, Clean Catch     Status: Abnormal   Collection Time: 03/30/20  8:24 AM  Result Value Ref Range   Color, Urine STRAW (A) YELLOW   APPearance CLEAR (A) CLEAR   Specific Gravity, Urine 1.009 1.005 - 1.030   pH 7.0 5.0 - 8.0   Glucose, UA NEGATIVE NEGATIVE mg/dL   Hgb urine dipstick NEGATIVE NEGATIVE   Bilirubin Urine NEGATIVE NEGATIVE   Ketones, ur NEGATIVE NEGATIVE mg/dL   Protein, ur NEGATIVE NEGATIVE mg/dL   Nitrite NEGATIVE NEGATIVE   Leukocytes,Ua NEGATIVE NEGATIVE   RBC / HPF 0-5 0 - 5 RBC/hpf   WBC, UA 0-5 0 - 5 WBC/hpf   Bacteria, UA NONE SEEN NONE SEEN   Squamous Epithelial / LPF NONE SEEN 0 - 5  Hepatic function panel     Status: None   Collection Time: 03/30/20  8:24 AM  Result Value Ref Range   Total Protein 6.9 6.5 - 8.1 g/dL   Albumin 3.6 3.5 - 5.0 g/dL   AST 23 15 - 41 U/L   ALT 16 0 - 44 U/L   Alkaline Phosphatase 47 38 - 126 U/L   Total Bilirubin 0.8 0.3 - 1.2 mg/dL   Bilirubin, Direct <0.1 0.0 - 0.2 mg/dL   Indirect Bilirubin NOT CALCULATED 0.3 - 0.9 mg/dL  Troponin I (High Sensitivity)     Status: None   Collection Time: 03/30/20  8:24 AM  Result Value Ref Range   Troponin I (High Sensitivity) 10 <18 ng/L  Troponin I (High  Sensitivity)     Status: None   Collection Time: 03/30/20 10:39 AM  Result Value Ref Range   Troponin I (High Sensitivity) 9 <18 ng/L   ____________________________________________  EKG My review and personal interpretation at Time: 8:17   Indication: weakness  Rate: 70  Rhythm: sinus Axis: normal Other: normal intervals, no stemi ____________________________________________  RADIOLOGY  I personally reviewed all radiographic images ordered to evaluate for the above acute complaints and reviewed radiology reports and findings.  These findings were personally discussed with the patient.  Please see medical record for radiology report.  ____________________________________________   PROCEDURES  Procedure(s) performed:  Procedures    Critical Care performed: no ____________________________________________   INITIAL IMPRESSION / ASSESSMENT AND PLAN / ED COURSE  Pertinent labs & imaging results that were available during my care of the patient were reviewed by me and considered in my medical decision making (see chart for details).   DDX: Dehydration, sepsis, pna, uti, hypoglycemia, cva, drug effect, withdrawal, encephalitis   Nathaniel Thompson is a 85 y.o. who presents to the ED with presentation as described above.  Patient nontoxic-appearing no focal neuro deficits.  His exam is reassuring.  Possible vertiginous symptoms patient does have  risk factors for CVA and history of previous stroke.  CT head will be ordered for evaluation of head injury, mass or other cause of h status.  Will order blood work for broad differential.  The patient will be placed on continuous pulse oximetry and telemetry for monitoring.  Laboratory evaluation will be sent to evaluate for the above complaints.     Clinical Course as of 03/30/20 1346  Tue Mar 30, 2020  1106 Work-up thus far is reassuring.  No sign of UTI.  He is hemodynamically stable.  Will order MRI to evaluate for CVA. [PR]  1594 MRI is  stable and there is no evidence of acute intracranial abnormality.  His repeat exam is reassuring.  His work-up is grossly unremarkable here in the ER.  Will ambulate patient and if he is able to tolerate that I think he is appropriate for outpatient follow-up.  Patient family agreeable to plan. [PR]    Clinical Course User Index [PR] Merlyn Lot, MD    The patient was evaluated in Emergency Department today for the symptoms described in the history of present illness. He/she was evaluated in the context of the global COVID-19 pandemic, which necessitated consideration that the patient might be at risk for infection with the SARS-CoV-2 virus that causes COVID-19. Institutional protocols and algorithms that pertain to the evaluation of patients at risk for COVID-19 are in a state of rapid change based on information released by regulatory bodies including the CDC and federal and state organizations. These policies and algorithms were followed during the patient's care in the ED.  As part of my medical decision making, I reviewed the following data within the [facility] notes reviewed and incorporated, Labs reviewed, notes from prior ED visits and [place] Controlled Substance Database   ____________________________________________   FINAL CLINICAL IMPRESSION(S) / ED DIAGNOSES  Final diagnoses:  Dizziness      NEW MEDICATIONS STARTED DURING THIS VISIT:  New Prescriptions   No medications on file     Note:  This document was prepared using Dragon voice recognition software and may include unintentional dictation errors.    Merlyn Lot, MD 03/30/20 1346

## 2020-04-01 ENCOUNTER — Encounter: Payer: Self-pay | Admitting: Internal Medicine

## 2020-04-01 ENCOUNTER — Other Ambulatory Visit: Payer: Self-pay

## 2020-04-01 ENCOUNTER — Ambulatory Visit: Payer: Medicare HMO | Admitting: Internal Medicine

## 2020-04-01 DIAGNOSIS — D500 Iron deficiency anemia secondary to blood loss (chronic): Secondary | ICD-10-CM | POA: Diagnosis not present

## 2020-04-01 DIAGNOSIS — I4891 Unspecified atrial fibrillation: Secondary | ICD-10-CM

## 2020-04-01 DIAGNOSIS — G4730 Sleep apnea, unspecified: Secondary | ICD-10-CM

## 2020-04-01 DIAGNOSIS — I10 Essential (primary) hypertension: Secondary | ICD-10-CM | POA: Diagnosis not present

## 2020-04-01 DIAGNOSIS — I714 Abdominal aortic aneurysm, without rupture, unspecified: Secondary | ICD-10-CM

## 2020-04-01 DIAGNOSIS — I724 Aneurysm of artery of lower extremity: Secondary | ICD-10-CM | POA: Diagnosis not present

## 2020-04-01 DIAGNOSIS — J449 Chronic obstructive pulmonary disease, unspecified: Secondary | ICD-10-CM

## 2020-04-01 DIAGNOSIS — R739 Hyperglycemia, unspecified: Secondary | ICD-10-CM

## 2020-04-01 DIAGNOSIS — Z8673 Personal history of transient ischemic attack (TIA), and cerebral infarction without residual deficits: Secondary | ICD-10-CM

## 2020-04-01 DIAGNOSIS — I779 Disorder of arteries and arterioles, unspecified: Secondary | ICD-10-CM

## 2020-04-01 DIAGNOSIS — E7800 Pure hypercholesterolemia, unspecified: Secondary | ICD-10-CM

## 2020-04-01 DIAGNOSIS — I2510 Atherosclerotic heart disease of native coronary artery without angina pectoris: Secondary | ICD-10-CM

## 2020-04-01 NOTE — Patient Instructions (Signed)
Hold chlorthalidone

## 2020-04-01 NOTE — Progress Notes (Signed)
Patient ID: TRAVARES NELLES, male   DOB: [birth year], 85 y.o.   MRN: [id number]   Subjective:    Patient ID: GORDAN GRELL, male    DOB: 1936/01/09, 85 y.o.   MRN: [id number]  HPI This visit occurred during the SARS-CoV-2 public health emergency.  Safety protocols were in place, including screening questions prior to the visit, additional usage of staff PPE, and extensive cleaning of exam room while observing appropriate contact time as indicated for disinfecting solutions.  Patient here for ER follow up. Was seen in ER 03/30/20 with concerns regarding dizziness - feeling as if he is going to pass out.  States that 03/29/20 evening - not feel right.  Got up early am of 03/30/20 and felt dizzy and had to hold on to things to walk.  Same feelings a couple of hours later.  Later that am, persistent.  Felt the sides of his head drawing.  No headache.  Called EMS.  Taken to ER.  W/up unrevealing.  No evidence of UTI.  MRI - stable.  No acute intracranial abnormality.  States he does feel better, but has had a couple of episodes of dizziness today.  No chest pain.  Breathing stable.  No nausea or vomiting.  Eating.  Bowels stable.  Has had intermittent dizziness for a while.  Has had extensive w/up previously.  Sees cardiology.  Had f/u with neurology 03/18/20.  Wanted to pursue f/u EEG.  Declined - since has had negative EEGs previously.    Past Medical History:  Diagnosis Date  . (HFpEF) heart failure with preserved ejection fraction ([place])    a. 05/2018 Echo: Nl EF, Gr1 DD, sev dil LA, mild MR/TR, mild PAH; b. 05/2019 Echo: EF 55-60%, no rwma, mild LVH. Nl PASP. Mildly dil LA. Triv MR. Mild AoV sclerosis w/o stenosis. Mildly dil Asc AO (66m).  . Allergic rhinitis   . Barrett's esophagus 10/21/13  . Bone cancer ([facility]   . CKD (chronic kidney disease), stage III ([facility]   . Colon polyp, hyperplastic   . COPD (chronic obstructive pulmonary disease) (H[facility])    mild COPD. former smoker  . Coronary artery disease     a. 2003 s/p PCI ([facility]); b. 2004 s/p PCI x 2 ([facility]); c. 11/2017 Cath: LM nl, LAD mild dzs, LCX patent stent w/ 30% distal edge restenosis, RCA patent distal stent w/ 30% prox edge stenosis. Nl EF->Med Rx.  . Diverticulosis   . Fundic gland polyps of stomach, benign   . Gastritis   . GERD (gastroesophageal reflux disease)   . Hypercholesteremia   . Hypertension   . Prostate cancer ([place]   . Prostatism   . Reflux esophagitis   . Renal stones   . Skin cancer    left cheek/lesion excised  . Sleep apnea   . Tachycardia    a. 11/2017 admit w/ tachycardia/? afib-->no afib noted upon review (amio/OAC d/c'd).  .Marland KitchenTIA (transient ischemic attack)    Past Surgical History:  Procedure Laterality Date  . CARDIAC CATHETERIZATION    . COLONOSCOPY WITH PROPOFOL N/A 09/13/2015   Procedure: COLONOSCOPY WITH PROPOFOL;  Surgeon: MLollie Sails MD;  [facility]  Service: Endoscopy;  Laterality: N/A;  . COLONOSCOPY WITH PROPOFOL N/A 05/07/2019   Procedure: COLONOSCOPY WITH PROPOFOL;  Surgeon: BRobert Bellow MD;  Location: [facility] ENDOSCOPY;  Service: Endoscopy;  Laterality: N/A;  . CORONARY ANGIOPLASTY    . ESOPHAGOGASTRODUODENOSCOPY (EGD) WITH PROPOFOL N/A 09/13/2015   Procedure: ESOPHAGOGASTRODUODENOSCOPY (EGD) WITH  PROPOFOL;  Surgeon: Lollie Sails, MD;  [facility]COPY;  Service: Endoscopy;  Laterality: N/A;  . ESOPHAGOGASTRODUODENOSCOPY (EGD) WITH PROPOFOL N/A 12/28/2015   Procedure: ESOPHAGOGASTRODUODENOSCOPY (EGD) WITH PROPOFOL;  Surgeon: Lollie Sails, MD;  Location: [facility] ENDOSCOPY;  Service: Endoscopy;  Laterality: N/A;  . ESOPHAGOGASTRODUODENOSCOPY (EGD) WITH PROPOFOL N/A 06/16/2016   Procedure: ESOPHAGOGASTRODUODENOSCOPY (EGD) WITH PROPOFOL;  Surgeon: Lollie Sails, MD;  Location: [facility] ENDOSCOPY;  Service: Endoscopy;  Laterality: N/A;  . ESOPHAGOGASTRODUODENOSCOPY (EGD) WITH PROPOFOL N/A 05/07/2019   Procedure: ESOPHAGOGASTRODUODENOSCOPY (EGD) WITH PROPOFOL;   Surgeon: Robert Bellow, MD;  Location: [facility] ENDOSCOPY;  Service: Endoscopy;  Laterality: N/A;  . EYE SURGERY  2013   CATARACT EXTRACTION  . GANGLION CYST EXCISION Right 05/18/2017   Procedure: REMOVAL GANGLION CYST ANKLE;  Surgeon: Samara Deist, DPM;  Location: [facility] ORS;  Service: Podiatry;  Laterality: Right;  . heart cath stent    . LEFT HEART CATH AND CORONARY ANGIOGRAPHY Right 12/03/2017   Procedure: Left Heart Cath and Coronary Angiography with possible coronary intervention;  Surgeon: Dionisio David, MD;  Location: [facility];  Service: Cardiovascular;  Laterality: Right;  . PROSTATE BIOPSY    . stents     multiple   Family History  Problem Relation Age of Onset  . Cancer Mother        gastric and lung  . Cancer Father        multiple myeloma  . Stroke Father   . Cancer Sister        leukemia  . Cancer Brother        leukemia  . Cancer Brother        kidney  . Cancer Daughter        Uterine  . Cancer Other        [place] (Sister's Son): Prostate   Social History   Socioeconomic History  . Marital status: Married    Spouse name: Not on file  . Number of children: Not on file  . Years of education: Not on file  . Highest education level: Not on file  Occupational History  . Not on file  Tobacco Use  . Smoking status: Former Smoker    Packs/day: 1.00    Years: 35.00    Pack years: 35.00    Types: Cigarettes    Quit date: 03/06/1986    Years since quitting: 34.1  . Smokeless tobacco: Former Systems developer    Types: Secondary school teacher  . Vaping Use: Never used  Substance and Sexual Activity  . Alcohol use: No  . Drug use: No  . Sexual activity: Not Currently  Other Topics Concern  . Not on file  Social History Narrative  . Not on file   Social Determinants of Health   Financial Resource Strain: Not on file  Food Insecurity: Not on file  Transportation Needs: Not on file  Physical Activity: Not on file  Stress: Not on file  Social Connections: Not  on file    Outpatient Encounter Medications as of 04/01/2020  Medication Sig  . amLODipine (NORVASC) 10 MG tablet TAKE 1 TABLET BY MOUTH ONCE DAILY  . aspirin EC 81 MG tablet Take 81 mg by mouth daily.  . calcium citrate-vitamin D (CITRACAL+D) 315-200 MG-UNIT tablet Take 1 tablet by mouth daily.  . chlorthalidone (HYGROTON) 25 MG tablet Take 0.5 tablets (12.5 mg total) by mouth daily.  . cholecalciferol (VITAMIN D) 1000 units tablet Take 1,000 Units by mouth daily.   [name]  clopidogrel (PLAVIX) 75 MG tablet Take 1 tablet (75 mg total) by mouth daily.  [name] ezetimibe (ZETIA) 10 MG tablet Take 1 tablet (10 mg total) by mouth daily.  . ferrous sulfate 325 (65 FE) MG tablet Take by mouth.  . fexofenadine (ALLEGRA) 180 MG tablet Take 180 mg by mouth daily as needed for allergies or rhinitis.   . fluticasone (FLONASE) 50 MCG/ACT nasal spray Place 2 sprays into both nostrils daily.   . isosorbide mononitrate (IMDUR) 30 MG 24 hr tablet TAKE 1 TABLET BY MOUTH ONCE DAILY  . losartan (COZAAR) 50 MG tablet Take 1 tablet (50 mg total) by mouth daily.  [name] lovastatin (MEVACOR) 40 MG tablet TAKE 2 TABLETS BY MOUTH AT BEDTIME  . Multiple Vitamin (MULTI-VITAMINS) TABS Take 1 tablet by mouth daily.   . nitroGLYCERIN (NITROSTAT) 0.4 MG SL tablet Place 1 tablet (0.4 mg total) under the tongue every 5 (five) minutes as needed for chest pain.  . pantoprazole (PROTONIX) 40 MG tablet Take 40 mg by mouth 2 (two) times daily.    No facility-administered encounter medications on file as of 04/01/2020.    Review of Systems  Constitutional: Negative for appetite change and unexpected weight change.  HENT: Negative for congestion and sinus pressure.   Respiratory: Negative for cough and chest tightness.        Breathing stable.   Cardiovascular: Negative for chest pain, palpitations and leg swelling.  Gastrointestinal: Negative for abdominal pain, diarrhea, nausea and vomiting.  Genitourinary: Negative for difficulty  urinating and dysuria.  Musculoskeletal: Negative for joint swelling and myalgias.  Skin: Negative for color change and rash.  Neurological: Positive for dizziness and light-headedness. Negative for headaches.  Psychiatric/Behavioral: Negative for agitation and dysphoric mood.       Objective:    Physical Exam Vitals reviewed.  Constitutional:      General: He is not in acute distress.    Appearance: Normal appearance. He is well-developed and well-nourished.  HENT:     Head: Normocephalic and atraumatic.     Right Ear: External ear normal.     Left Ear: External ear normal.  Eyes:     General: No scleral icterus.       Right eye: No discharge.        Left eye: No discharge.     Conjunctiva/sclera: Conjunctivae normal.  Cardiovascular:     Rate and Rhythm: Normal rate and regular rhythm.  Pulmonary:     Effort: Pulmonary effort is normal. No respiratory distress.     Breath sounds: Normal breath sounds.  Abdominal:     General: Bowel sounds are normal.     Palpations: Abdomen is soft.     Tenderness: There is no abdominal tenderness.  Musculoskeletal:        General: No swelling, tenderness or edema.     Cervical back: Neck supple. No tenderness.  Lymphadenopathy:     Cervical: No cervical adenopathy.  Skin:    Findings: No erythema or rash.  Neurological:     Mental Status: He is alert.  Psychiatric:        Mood and Affect: Mood and affect and mood normal.        Behavior: Behavior normal.    Orthostatic on exam:  Standing blood pressure 92/62 and lying down 112/68  BP 98/60   Pulse 98   Temp 97.9 F (36.6 C)   Ht '5\' 10"'  (1.778 m)   Wt 216 lb (98 kg)   SpO2 98%  BMI 30.99 kg/m  Wt Readings from Last 3 Encounters:  04/01/20 216 lb (98 kg)  03/30/20 219 lb (99.3 kg)  02/05/20 215 lb (97.5 kg)     Lab Results  Component Value Date   WBC 5.0 03/30/2020   HGB 12.0 (L) 03/30/2020   HCT 34.5 (L) 03/30/2020   PLT 190 03/30/2020   GLUCOSE 110 (H)  03/30/2020   CHOL 117 12/08/2019   TRIG 111.0 12/08/2019   HDL 33.60 (L) 12/08/2019   LDLCALC 61 12/08/2019   ALT 16 03/30/2020   AST 23 03/30/2020   NA 140 03/30/2020   K 3.6 03/30/2020   CL 103 03/30/2020   CREATININE 1.21 03/30/2020   BUN 23 03/30/2020   CO2 26 03/30/2020   TSH 1.82 03/31/2019   PSA 5.47 (H) 08/03/2016   HGBA1C 5.9 12/08/2019    CT Head Wo Contrast  Result Date: 03/30/2020 CLINICAL DATA:  Mental status change EXAM: CT HEAD WITHOUT CONTRAST TECHNIQUE: Contiguous axial images were obtained from the base of the skull through the vertex without intravenous contrast. COMPARISON:  02/03/2019 FINDINGS: Brain: There is no acute intracranial hemorrhage, mass effect, or edema. No new loss of gray-white differentiation. Small chronic right frontal infarct. There is no extra-axial fluid collection. Ventricles and sulci are stable in size and configuration. Vascular: There is atherosclerotic calcification at the skull base. Skull: Calvarium is unremarkable. Sinuses/Orbits: Minor mucosal thickening. Other: None. IMPRESSION: No acute intracranial abnormality. Small chronic right parietal infarct. Electronically Signed   By: Macy Mis M.D.   On: 03/30/2020 08:54   MR BRAIN WO CONTRAST  Result Date: 03/30/2020 CLINICAL DATA:  Mental status change, unknown cause. Additional provided: Patient complains of weakness, feeling as though he is going to fall over, "swim E" head, arm tingling, constipation. EXAM: MRI HEAD WITHOUT CONTRAST TECHNIQUE: Multiplanar, multiecho pulse sequences of the brain and surrounding structures were obtained without intravenous contrast. COMPARISON:  Head CT 03/30/2020.  Brain MRI 01/15/2020. FINDINGS: Brain: Cerebral volume is normal for age. Small chronic right frontal lobe cortical infarct. Subcentimeter focus of T2/FLAIR hyperintensity within the posterior left frontal lobe white matter, likely reflecting minimal chronic small vessel ischemic disease.  Redemonstrated punctate focus of SWI signal loss in the left parietal lobe compatible with chronic microhemorrhage. There is no acute infarct. No evidence of intracranial mass. No chronic intracranial blood products. No extra-axial fluid collection. No midline shift. Vascular: Expected proximal arterial flow voids. Skull and upper cervical spine: No focal marrow lesion Sinuses/Orbits: Visualized orbits show no acute finding. Bilateral lens replacements. Trace ethmoid sinus mucosal thickening. Small right maxillary sinus mucous retention cysts. Other: Trace fluid within the left mastoid air cells. Right TMJ osteoarthrosis IMPRESSION: No evidence of acute intracranial abnormality, including acute infarction. Stable MRI appearance of the brain as compared to the prior exam of 01/15/2020. Small chronic right frontal lobe cortical infarct. Subcentimeter focus of T2 hyperintensity within the posterior left frontal white matter, likely reflecting minimal chronic small vessel ischemic disease. Mild paranasal sinus disease as described. Electronically Signed   By: Kellie Simmering DO   On: 03/30/2020 13:29   DG Chest Portable 1 View  Result Date: 03/30/2020 CLINICAL DATA:  Weakness.  Evaluate for pulmonary edema. EXAM: PORTABLE CHEST 1 VIEW COMPARISON:  04/30/2018 FINDINGS: Heart size is mildly enlarged. Pulmonary vascular congestion. No overt edema, pleural effusion, or airspace consolidation. Calcified granuloma noted in the right upper lobe. IMPRESSION: Cardiac enlargement and pulmonary vascular congestion. Electronically Signed   By: Kerby Moors  M.D.   On: 03/30/2020 08:50       Assessment & Plan:   Problem List Items Addressed This Visit    Abdominal aortic aneurysm (AAA) ([place])    Followed by [facility].  Last evaluated 01/2020.  Continues to follow.       Atrial fibrillation, new onset ([place])    Appears to be in SR now.  Follow.       CAD (coronary artery disease)    No chest pain.  Breathing stable.  Sees  cardiology.  Recent dizziness as outlined.  Orthostatic on exam.  Adjust medication as outlined.  Continue risk factor modification.       Carotid artery disease ([place])    Saw [facility] 01/2020.  Stable.  Recommended f/u in one year.       COPD (chronic obstructive pulmonary disease) (HCC)    Breathing stable.        History of TIA (transient ischemic attack)    Continue aspirin and plavix.  MRI as outlined.  No focal abnormality noted on exam.        Hypercholesterolemia    On lovastatin.  Follow lipid panel and liver function tests.        Hyperglycemia    Follow met b and a1c.       Hypertension, essential    Blood pressure as outlined.  Orthostatic on exam.  On chlorthalidone, imdur, amlodipine and losartan.  Will hold chlorthalidone for now.  Keep appt with me next week.  Follow pressures.  Call with update.  Follow metabolic panel.       Iron deficiency anemia due to chronic blood loss    Colonoscopy 05/2019.  Followed by hematology.  Follow cbc.       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  Evaluated 01/22/20.  Stable.  Recommended f/u scanning in 2 years.       Sleep apnea    Continue cpap.           Einar Pheasant, MD

## 2020-04-04 ENCOUNTER — Encounter: Payer: Self-pay | Admitting: Internal Medicine

## 2020-04-04 NOTE — Assessment & Plan Note (Signed)
Appears to be in SR now.  Follow.

## 2020-04-04 NOTE — Assessment & Plan Note (Signed)
Breathing stable.

## 2020-04-04 NOTE — Assessment & Plan Note (Signed)
Saw [facility] 01/2020.  Stable.  Recommended f/u in one year.

## 2020-04-04 NOTE — Assessment & Plan Note (Signed)
Blood pressure as outlined.  Orthostatic on exam.  On chlorthalidone, imdur, amlodipine and losartan.  Will hold chlorthalidone for now.  Keep appt with me next week.  Follow pressures.  Call with update.  Follow metabolic panel.

## 2020-04-04 NOTE — Assessment & Plan Note (Signed)
On lovastatin.  Follow lipid panel and liver function tests.   

## 2020-04-04 NOTE — Assessment & Plan Note (Signed)
Colonoscopy 05/2019.  Followed by hematology.  Follow cbc.

## 2020-04-04 NOTE — Assessment & Plan Note (Signed)
No chest pain.  Breathing stable.  Sees cardiology.  Recent dizziness as outlined.  Orthostatic on exam.  Adjust medication as outlined.  Continue risk factor modification.

## 2020-04-04 NOTE — Assessment & Plan Note (Signed)
Continue cpap.  

## 2020-04-04 NOTE — Assessment & Plan Note (Signed)
Evaluated by [facility].  Evaluated 01/22/20.  Stable.  Recommended f/u scanning in 2 years.

## 2020-04-04 NOTE — Assessment & Plan Note (Signed)
Continue aspirin and plavix.  MRI as outlined.  No focal abnormality noted on exam.

## 2020-04-04 NOTE — Assessment & Plan Note (Signed)
Followed by [facility].  Last evaluated 01/2020.  Continues to follow.

## 2020-04-04 NOTE — Assessment & Plan Note (Signed)
Follow met b and a1c.  

## 2020-04-06 ENCOUNTER — Inpatient Hospital Stay: Payer: Medicare HMO

## 2020-04-06 ENCOUNTER — Other Ambulatory Visit: Payer: Self-pay

## 2020-04-06 ENCOUNTER — Inpatient Hospital Stay: Payer: Medicare HMO | Admitting: Internal Medicine

## 2020-04-06 ENCOUNTER — Inpatient Hospital Stay: Payer: Medicare HMO | Attending: Internal Medicine

## 2020-04-06 VITALS — BP 135/67 | HR 66 | Temp 97.7°F | Resp 20 | Ht 70.0 in | Wt 218.0 lb

## 2020-04-06 VITALS — BP 127/72 | HR 50 | Temp 96.8°F | Resp 18

## 2020-04-06 DIAGNOSIS — D500 Iron deficiency anemia secondary to blood loss (chronic): Secondary | ICD-10-CM

## 2020-04-06 DIAGNOSIS — C61 Malignant neoplasm of prostate: Secondary | ICD-10-CM

## 2020-04-06 DIAGNOSIS — D509 Iron deficiency anemia, unspecified: Secondary | ICD-10-CM | POA: Insufficient documentation

## 2020-04-06 DIAGNOSIS — N183 Chronic kidney disease, stage 3 unspecified: Secondary | ICD-10-CM | POA: Diagnosis not present

## 2020-04-06 LAB — COMPREHENSIVE METABOLIC PANEL
ALT: 14 U/L (ref 0–44)
AST: 23 U/L (ref 15–41)
Albumin: 3.8 g/dL (ref 3.5–5.0)
Alkaline Phosphatase: 44 U/L (ref 38–126)
Anion gap: 7 (ref 5–15)
BUN: 23 mg/dL (ref 8–23)
CO2: 26 mmol/L (ref 22–32)
Calcium: 9.4 mg/dL (ref 8.9–10.3)
Chloride: 108 mmol/L (ref 98–111)
Creatinine, Ser: 1.3 mg/dL — ABNORMAL HIGH (ref 0.61–1.24)
GFR, Estimated: 55 mL/min — ABNORMAL LOW (ref >60)
Glucose, Bld: 103 mg/dL — ABNORMAL HIGH (ref 70–99)
Potassium: 3.8 mmol/L (ref 3.5–5.1)
Sodium: 141 mmol/L (ref 135–145)
Total Bilirubin: 0.7 mg/dL (ref 0.3–1.2)
Total Protein: 6.9 g/dL (ref 6.5–8.1)

## 2020-04-06 LAB — CBC WITH DIFFERENTIAL/PLATELET
Abs Immature Granulocytes: 0.01 10*3/uL (ref 0.00–0.07)
Basophils Absolute: 0 10*3/uL (ref 0.0–0.1)
Basophils Relative: 1 %
Eosinophils Absolute: 0.3 10*3/uL (ref 0.0–0.5)
Eosinophils Relative: 7 %
HCT: 32.4 % — ABNORMAL LOW (ref 39.0–52.0)
Hemoglobin: 11.5 g/dL — ABNORMAL LOW (ref 13.0–17.0)
Immature Granulocytes: 0 %
Lymphocytes Relative: 26 %
Lymphs Abs: 1.3 10*3/uL (ref 0.7–4.0)
MCH: 30.9 pg (ref 26.0–34.0)
MCHC: 35.5 g/dL (ref 30.0–36.0)
MCV: 87.1 fL (ref 80.0–100.0)
Monocytes Absolute: 0.5 10*3/uL (ref 0.1–1.0)
Monocytes Relative: 11 %
Neutro Abs: 2.7 10*3/uL (ref 1.7–7.7)
Neutrophils Relative %: 55 %
Platelets: 192 10*3/uL (ref 150–400)
RBC: 3.72 MIL/uL — ABNORMAL LOW (ref 4.22–5.81)
RDW: 13.1 % (ref 11.5–15.5)
WBC: 4.9 10*3/uL (ref 4.0–10.5)
nRBC: 0 % (ref 0.0–0.2)

## 2020-04-06 LAB — IRON AND TIBC
Iron: 70 ug/dL (ref 45–182)
Saturation Ratios: 20 % (ref 17.9–39.5)
TIBC: 358 ug/dL (ref 250–450)
UIBC: 288 ug/dL

## 2020-04-06 LAB — PSA: Prostatic Specific Antigen: <0.01 ng/mL (ref 0.00–4.00)

## 2020-04-06 LAB — FERRITIN: Ferritin: 21 ng/mL — ABNORMAL LOW (ref 24–336)

## 2020-04-06 MED ORDER — SODIUM CHLORIDE 0.9 % IV SOLN
Freq: Once | INTRAVENOUS | Status: AC
  Filled 2020-04-06: qty 250

## 2020-04-06 MED ORDER — IRON SUCROSE 20 MG/ML IV SOLN
200.0000 mg | Freq: Once | INTRAVENOUS | Status: AC
  Administered 2020-04-06: 200 mg via INTRAVENOUS
  Filled 2020-04-06: qty 10

## 2020-04-06 MED ORDER — LEUPROLIDE ACETATE (3 MONTH) 22.5 MG ~~LOC~~ KIT
22.5000 mg | PACK | Freq: Once | SUBCUTANEOUS | Status: DC
  Filled 2020-04-06: qty 22.5

## 2020-04-06 NOTE — Assessment & Plan Note (Addendum)
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 Stage IV-on Eligard; Bone scan October 2020-improved;  NOV 2021- PSA- <0.1.  Hold Eligard today given dizzy spells/worsening fatigue.  Will reevaluate in 6 weeks.  # Iron deficient anemia/CKD- III-[s/p EGD March 2021]--on p.o. iron hemoglobin is 11.5./Given fatigue proceed with IV iron infusion.  Check iron studies.  # Dizzy spells- ? Seizures [s/p EEG; Dr.Shah]; MRI brain-no evidence of stroke.  Blood pressure medications titrated as per PCP.  # A. fib paroxysmal-sinus rhythm; aspirin Plavix- STABLE.  #Basal cell carcinoma- back/trunk-okay to proceed with resection from oncology standpoint.  # DISPOSITION:  # HOLD Eligard today; proceed with  Venofer # Follow up in 6 weeks-labs-cbc- eligard; possible venofer- Dr.B  Cc; Dr.Scott/

## 2020-04-06 NOTE — Progress Notes (Signed)
[place] OFFICE PROGRESS NOTE  Patient Care Team: Einar Pheasant, MD as PCP - General (Internal Medicine) [name], MD as PCP - Cardiology (Cardiology) [name], MD as Consulting Physician (Cardiology)  Cancer Staging No matching staging information was found for the patient.   Oncology History Overview Note  Nov 2017- completed IM RT radiation therapy to his prostate and pelvic nodes for Gleason 7 (4+3) adenocarcinoma the prostate presenting the PSA of 7.8.  # JAN 2019- METASTATIC PROSTATE CA to Bone [low volume met on bone scan; CT- NED]; PSA ~50; March 25th 2019- Lupron 45 mg IM [Urology q 82M]; June 26, 2018-Eligard every 3 months[intolerance to Lupron/question A. Fib/injection site pain  # May 23rd Zytiga 27m/day; no prednisone; STOPPED MARCH 17th 2021-repeated severe hypokalemia;   # Barretts- EGD/ colo [[place]2021; Dr.Byrnett].   # OBenita Stabile [place]]; Oct 2019-paroxysmal A.fib/not on eliquis; [Dr.Khan] -Dr.Arida ; Dizzy spells [Dr.Shah]  ------------------------------------------------------------------  DIAGNOSIS: [ JAN 2019]- Met- PROSTATE CANCER  STAGE:  IV       ;GOALS: PALLIATIVE  CURRENT/MOST RECENT THERAPY [ march 2019]- Lupron; May 23rd 2019- Zytiga 2568mday; discontinued-March 2021.     [facility]    INTERVAL HISTORY:  JaBROCKTON MCKESSON89.o.  male pleasant patient above history of metastatic prostate cancer on ADT is here for follow-up.   In the interim patient noted to have worsening dizzy spells for which she was evaluated in the emergency room with MRI brain no acute process noted.  His blood pressure medications have been titrated given the dizzy spells/mild hypotension.  Complains of fatigue.  Otherwise denies any blood in stools or black or stools.  No nausea vomiting.  In the interim patient has been diagnosed with basal cell carcinoma-needing re-section.   Review of Systems  Constitutional:  Negative for chills, diaphoresis, fever, malaise/fatigue and weight loss.  HENT: Negative for nosebleeds and sore throat.   Eyes: Negative for double vision.  Respiratory: Negative for cough, hemoptysis, sputum production, shortness of breath and wheezing.   Cardiovascular: Negative for chest pain, palpitations, orthopnea and leg swelling.  Gastrointestinal: Negative for abdominal pain, blood in stool, constipation, diarrhea, heartburn, melena, nausea and vomiting.  Genitourinary: Negative for dysuria, frequency and urgency.  Musculoskeletal: Positive for myalgias. Negative for back pain and joint pain.  Skin: Negative.  Negative for itching and rash.  Neurological: Positive for dizziness. Negative for tingling, focal weakness, weakness and headaches.  Endo/Heme/Allergies: Does not bruise/bleed easily.  Psychiatric/Behavioral: Negative for depression. The patient is not nervous/anxious and does not have insomnia.     PAST MEDICAL HISTORY :  Past Medical History:  Diagnosis Date  . (HFpEF) heart failure with preserved ejection fraction ([facility]   a. 05/2018 Echo: Nl EF, Gr1 DD, sev dil LA, mild MR/TR, mild PAH; b. 05/2019 Echo: EF 55-60%, no rwma, mild LVH. Nl PASP. Mildly dil LA. Triv MR. Mild AoV sclerosis w/o stenosis. Mildly dil Asc AO (3957m  . Allergic rhinitis   . Barrett's esophagus 10/21/13  . Bone cancer ([facility] . CKD (chronic kidney disease), stage III ([facility] . Colon polyp, hyperplastic   . COPD (chronic obstructive pulmonary disease) (HCC)    mild COPD. former smoker  . Coronary artery disease    a. 2003 s/p PCI ([facility]); b. 2004 s/p PCI x 2 ([facility]); c. 11/2017 Cath: LM nl, LAD mild dzs, LCX patent stent w/ 30% distal edge restenosis, RCA patent distal stent w/ 30%  prox edge stenosis. Nl EF->Med Rx.  . Diverticulosis   . Fundic gland polyps of stomach, benign   . Gastritis   . GERD (gastroesophageal reflux disease)   . Hypercholesteremia   . Hypertension   . Prostate cancer  ([place])   . Prostatism   . Reflux esophagitis   . Renal stones   . Skin cancer    left cheek/lesion excised  . Sleep apnea   . Tachycardia    a. 11/2017 admit w/ tachycardia/? afib-->no afib noted upon review (amio/OAC d/c'd).  [name] TIA (transient ischemic attack)     PAST SURGICAL HISTORY :   Past Surgical History:  Procedure Laterality Date  . CARDIAC CATHETERIZATION    . COLONOSCOPY WITH PROPOFOL N/A 09/13/2015   Procedure: COLONOSCOPY WITH PROPOFOL;  Surgeon: Lollie Sails, MD;  Location[facility] ENDOSCOPY;  Service: Endoscopy;  Laterality: N/A;  . COLONOSCOPY WITH PROPOFOL N/A 05/07/2019   Procedure: COLONOSCOPY WITH PROPOFOL;  Surgeon: Robert Bellow, MD;  Location: [facility] ENDOSCOPY;  Service: Endoscopy;  Laterality: N/A;  . CORONARY ANGIOPLASTY    . ESOPHAGOGASTRODUODENOSCOPY (EGD) WITH PROPOFOL N/A 09/13/2015   Procedure: ESOPHAGOGASTRODUODENOSCOPY (EGD) WITH PROPOFOL;  Surgeon: Lollie Sails, MD;  Location: [facility] ENDOSCOPY;  Service: Endoscopy;  Laterality: N/A;  . ESOPHAGOGASTRODUODENOSCOPY (EGD) WITH PROPOFOL N/A 12/28/2015   Procedure: ESOPHAGOGASTRODUODENOSCOPY (EGD) WITH PROPOFOL;  Surgeon: Lollie Sails, MD;  Location: [facility] ENDOSCOPY;  Service: Endoscopy;  Laterality: N/A;  . ESOPHAGOGASTRODUODENOSCOPY (EGD) WITH PROPOFOL N/A 06/16/2016   Procedure: ESOPHAGOGASTRODUODENOSCOPY (EGD) WITH PROPOFOL;  Surgeon: Lollie Sails, MD;  Location: [facility] ENDOSCOPY;  Service: Endoscopy;  Laterality: N/A;  . ESOPHAGOGASTRODUODENOSCOPY (EGD) WITH PROPOFOL N/A 05/07/2019   Procedure: ESOPHAGOGASTRODUODENOSCOPY (EGD) WITH PROPOFOL;  Surgeon: Robert Bellow, MD;  Location: [facility] ENDOSCOPY;  Service: Endoscopy;  Laterality: N/A;  . EYE SURGERY  2013   CATARACT EXTRACTION  . GANGLION CYST EXCISION Right 05/18/2017   Procedure: REMOVAL GANGLION CYST ANKLE;  Surgeon: Samara Deist, DPM;  Location: [facility] ORS;  Service: Podiatry;  Laterality: Right;  . heart cath stent    . LEFT HEART CATH  AND CORONARY ANGIOGRAPHY Right 12/03/2017   Procedure: Left Heart Cath and Coronary Angiography with possible coronary intervention;  Surgeon: Dionisio David, MD;  Location: [place] CV LAB;  Service: Cardiovascular;  Laterality: Right;  . PROSTATE BIOPSY    . stents     multiple    FAMILY HISTORY :   Family History  Problem Relation Age of Onset  . Cancer Mother        gastric and lung  . Cancer Father        multiple myeloma  . Stroke Father   . Cancer Sister        leukemia  . Cancer Brother        leukemia  . Cancer Brother        kidney  . Cancer Daughter        Uterine  . Cancer Other        [place] (Sister's Son): Prostate    SOCIAL HISTORY:   Social History   Tobacco Use  . Smoking status: Former Smoker    Packs/day: 1.00    Years: 35.00    Pack years: 35.00    Types: Cigarettes    Quit date: 03/06/1986    Years since quitting: 34.1  . Smokeless tobacco: Former Systems developer    Types: Secondary school teacher  . Vaping Use: Never used  Substance Use Topics  .  Alcohol use: No  . Drug use: No    ALLERGIES:  is allergic to atorvastatin.  MEDICATIONS:  Current Outpatient Medications  Medication Sig Dispense Refill  . amLODipine (NORVASC) 10 MG tablet TAKE 1 TABLET BY MOUTH ONCE DAILY 90 tablet 1  . aspirin EC 81 MG tablet Take 81 mg by mouth daily.    . calcium citrate-vitamin D (CITRACAL+D) 315-200 MG-UNIT tablet Take 1 tablet by mouth daily.    . cholecalciferol (VITAMIN D) 1000 units tablet Take 1,000 Units by mouth daily.     . clopidogrel (PLAVIX) 75 MG tablet Take 1 tablet (75 mg total) by mouth daily. 90 tablet 3  . ferrous sulfate 325 (65 FE) MG tablet Take by mouth.    . fexofenadine (ALLEGRA) 180 MG tablet Take 180 mg by mouth daily as needed for allergies or rhinitis.     . fluticasone (FLONASE) 50 MCG/ACT nasal spray Place 2 sprays into both nostrils daily.     . isosorbide mononitrate (IMDUR) 30 MG 24 hr tablet TAKE 1 TABLET BY MOUTH ONCE DAILY 90 tablet  2  . losartan (COZAAR) 50 MG tablet Take 1 tablet (50 mg total) by mouth daily. 90 tablet 1  . lovastatin (MEVACOR) 40 MG tablet TAKE 2 TABLETS BY MOUTH AT BEDTIME 180 tablet 1  . Multiple Vitamin (MULTI-VITAMINS) TABS Take 1 tablet by mouth daily.     . nitroGLYCERIN (NITROSTAT) 0.4 MG SL tablet Place 1 tablet (0.4 mg total) under the tongue every 5 (five) minutes as needed for chest pain. 25 tablet 0  . pantoprazole (PROTONIX) 40 MG tablet Take 40 mg by mouth 2 (two) times daily.     . chlorthalidone (HYGROTON) 25 MG tablet Take 0.5 tablets (12.5 mg total) by mouth daily. (Patient not taking: Reported on 04/06/2020) 30 tablet 1  . ezetimibe (ZETIA) 10 MG tablet Take 1 tablet (10 mg total) by mouth daily. (Patient not taking: Reported on 04/06/2020) 90 tablet 2   No current facility-administered medications for this visit.   Facility-Administered Medications Ordered in Other Visits  Medication Dose Route Frequency Provider Last Rate Last Admin  . 0.9 %  sodium chloride infusion   Intravenous Once Charlaine Dalton R, MD      . iron sucrose (VENOFER) injection 200 mg  200 mg Intravenous Once Cammie Sickle, MD      . Leuprolide Acetate (3 Month) (ELIGARD) 22.5 MG injection 22.5 mg  22.5 mg Subcutaneous Once Cammie Sickle, MD        PHYSICAL EXAMINATION: ECOG PERFORMANCE STATUS: 0 - Asymptomatic  BP 135/67   Pulse 66   Temp 97.7 F (36.5 C) (Tympanic)   Resp 20   Ht '5\' 10"'  (1.778 m)   Wt 218 lb (98.9 kg)   BMI 31.28 kg/m   Filed Weights   04/06/20 1300  Weight: 218 lb (98.9 kg)    Physical Exam Constitutional:      Comments: Patient is with his wife.  He is walking independently.  HENT:     Head: Normocephalic and atraumatic.     Mouth/Throat:     Pharynx: No oropharyngeal exudate.  Eyes:     Pupils: Pupils are equal, round, and reactive to light.  Cardiovascular:     Rate and Rhythm: Normal rate and regular rhythm.  Pulmonary:     Effort: No respiratory  distress.     Breath sounds: No wheezing.  Abdominal:     General: Bowel sounds are normal. There is no  distension.     Palpations: Abdomen is soft. There is no mass.     Tenderness: There is no abdominal tenderness. There is no guarding or rebound.  Musculoskeletal:        General: No tenderness. Normal range of motion.     Cervical back: Normal range of motion and neck supple.  Skin:    General: Skin is warm.  Neurological:     Mental Status: He is alert and oriented to person, place, and time.  Psychiatric:        Mood and Affect: Affect normal.     LABORATORY DATA:  I have reviewed the data as listed    Component Value Date/Time   NA 141 04/06/2020 1237   NA 140 09/16/2013 0438   K 3.8 04/06/2020 1237   K 4.2 09/16/2013 0438   CL 108 04/06/2020 1237   CL 109 (H) 09/16/2013 0438   CO2 26 04/06/2020 1237   CO2 24 09/16/2013 0438   GLUCOSE 103 (H) 04/06/2020 1237   GLUCOSE 101 (H) 09/16/2013 0438   BUN 23 04/06/2020 1237   BUN 20 (H) 09/16/2013 0438   CREATININE 1.30 (H) 04/06/2020 1237   CREATININE 1.28 09/16/2013 0438   CALCIUM 9.4 04/06/2020 1237   CALCIUM 8.3 (L) 09/16/2013 0438   PROT 6.9 04/06/2020 1237   PROT 6.7 09/15/2013 0755   ALBUMIN 3.8 04/06/2020 1237   ALBUMIN 3.5 09/15/2013 0755   AST 23 04/06/2020 1237   AST 37 09/15/2013 0755   ALT 14 04/06/2020 1237   ALT 28 09/15/2013 0755   ALKPHOS 44 04/06/2020 1237   ALKPHOS 44 (L) 09/15/2013 0755   BILITOT 0.7 04/06/2020 1237   BILITOT 0.5 09/15/2013 0755   GFRNONAA 55 (L) 04/06/2020 1237   GFRNONAA 54 (L) 09/16/2013 0438   GFRAA 59 (L) 10/07/2019 1305   GFRAA >60 09/16/2013 0438    No results found for: SPEP, UPEP  Lab Results  Component Value Date   WBC 4.9 04/06/2020   NEUTROABS 2.7 04/06/2020   HGB 11.5 (L) 04/06/2020   HCT 32.4 (L) 04/06/2020   MCV 87.1 04/06/2020   PLT 192 04/06/2020      Chemistry      Component Value Date/Time   NA 141 04/06/2020 1237   NA 140 09/16/2013 0438    K 3.8 04/06/2020 1237   K 4.2 09/16/2013 0438   CL 108 04/06/2020 1237   CL 109 (H) 09/16/2013 0438   CO2 26 04/06/2020 1237   CO2 24 09/16/2013 0438   BUN 23 04/06/2020 1237   BUN 20 (H) 09/16/2013 0438   CREATININE 1.30 (H) 04/06/2020 1237   CREATININE 1.28 09/16/2013 0438      Component Value Date/Time   CALCIUM 9.4 04/06/2020 1237   CALCIUM 8.3 (L) 09/16/2013 0438   ALKPHOS 44 04/06/2020 1237   ALKPHOS 44 (L) 09/15/2013 0755   AST 23 04/06/2020 1237   AST 37 09/15/2013 0755   ALT 14 04/06/2020 1237   ALT 28 09/15/2013 0755   BILITOT 0.7 04/06/2020 1237   BILITOT 0.5 09/15/2013 0755       RADIOGRAPHIC STUDIES: I have personally reviewed the radiological images as listed and agreed with the findings in the report. No results found.   ASSESSMENT & PLAN:  Prostate cancer ([place]) # Castrate sensitive-metastatic prostate cancer to the bone. Stage IV-on Eligard; Bone scan October 2020-improved;  NOV 2021- PSA- <0.1.  Hold Eligard today given dizzy spells/worsening fatigue.  Will reevaluate in 6 weeks.  #  Iron deficient anemia/CKD- III-[s/p EGD March 2021]--on p.o. iron hemoglobin is 11.5./Given fatigue proceed with IV iron infusion.  Check iron studies.  # Dizzy spells- ? Seizures [s/p EEG; Dr.Shah]; MRI brain-no evidence of stroke.  Blood pressure medications titrated as per PCP.  # A. fib paroxysmal-sinus rhythm; aspirin Plavix- STABLE.  #Basal cell carcinoma- back/trunk-okay to proceed with resection from oncology standpoint.  # DISPOSITION:  # HOLD Eligard today; proceed with  Venofer # Follow up in 6 weeks-labs-cbc- eligard; possible venofer- Dr.B  Cc; Dr.Scott/    Orders Placed This Encounter  Procedures  . Ferritin    Standing Status:   Future    Number of Occurrences:   1    Standing Expiration Date:   04/06/2021  . Iron and TIBC    Standing Status:   Future    Number of Occurrences:   1    Standing Expiration Date:   04/06/2021  . CBC with  Differential/Platelet    Standing Status:   Future    Standing Expiration Date:   04/06/2021   All questions were answered. The patient knows to call the clinic with any problems, questions or concerns.      Cammie Sickle, MD 04/06/2020 2:04 PM

## 2020-04-06 NOTE — Progress Notes (Signed)
Pt tolerated Venofer infusion well with no signs of complications. VSS. RN educated pt on the importance of notifying the clinic if any complications occur at home, pt verbalized understanding and all questions answered at this time. Pt stable for discharge.   Nathaniel Thompson  

## 2020-04-07 ENCOUNTER — Encounter: Payer: Self-pay | Admitting: Internal Medicine

## 2020-04-10 ENCOUNTER — Other Ambulatory Visit: Payer: Self-pay | Admitting: Internal Medicine

## 2020-04-12 ENCOUNTER — Other Ambulatory Visit: Payer: Medicare HMO

## 2020-04-14 ENCOUNTER — Other Ambulatory Visit: Payer: Self-pay

## 2020-04-14 ENCOUNTER — Encounter: Payer: Self-pay | Admitting: Internal Medicine

## 2020-04-14 ENCOUNTER — Ambulatory Visit (INDEPENDENT_AMBULATORY_CARE_PROVIDER_SITE_OTHER): Payer: Medicare HMO | Admitting: Internal Medicine

## 2020-04-14 DIAGNOSIS — I10 Essential (primary) hypertension: Secondary | ICD-10-CM | POA: Diagnosis not present

## 2020-04-14 DIAGNOSIS — K219 Gastro-esophageal reflux disease without esophagitis: Secondary | ICD-10-CM

## 2020-04-14 DIAGNOSIS — I2510 Atherosclerotic heart disease of native coronary artery without angina pectoris: Secondary | ICD-10-CM | POA: Diagnosis not present

## 2020-04-14 DIAGNOSIS — D500 Iron deficiency anemia secondary to blood loss (chronic): Secondary | ICD-10-CM

## 2020-04-14 DIAGNOSIS — C61 Malignant neoplasm of prostate: Secondary | ICD-10-CM

## 2020-04-14 DIAGNOSIS — I714 Abdominal aortic aneurysm, without rupture, unspecified: Secondary | ICD-10-CM

## 2020-04-14 DIAGNOSIS — Z8673 Personal history of transient ischemic attack (TIA), and cerebral infarction without residual deficits: Secondary | ICD-10-CM

## 2020-04-14 DIAGNOSIS — I4891 Unspecified atrial fibrillation: Secondary | ICD-10-CM

## 2020-04-14 DIAGNOSIS — I724 Aneurysm of artery of lower extremity: Secondary | ICD-10-CM

## 2020-04-14 DIAGNOSIS — R739 Hyperglycemia, unspecified: Secondary | ICD-10-CM

## 2020-04-14 DIAGNOSIS — E7800 Pure hypercholesterolemia, unspecified: Secondary | ICD-10-CM

## 2020-04-14 DIAGNOSIS — R42 Dizziness and giddiness: Secondary | ICD-10-CM

## 2020-04-14 DIAGNOSIS — J449 Chronic obstructive pulmonary disease, unspecified: Secondary | ICD-10-CM

## 2020-04-14 DIAGNOSIS — I779 Disorder of arteries and arterioles, unspecified: Secondary | ICD-10-CM

## 2020-04-14 DIAGNOSIS — G4730 Sleep apnea, unspecified: Secondary | ICD-10-CM

## 2020-04-14 MED ORDER — TRIAMCINOLONE ACETONIDE 0.1 % EX CREA: 1.0000 "application " | TOPICAL_CREAM | Freq: Two times a day (BID) | CUTANEOUS | 0 refills | Status: DC

## 2020-04-14 NOTE — Progress Notes (Signed)
Patient ID: Nathaniel Thompson, male   DOB: 1935-06-17, 85 y.o.   MRN: [id number]   Subjective:    Patient ID: Nathaniel Thompson, male    DOB: 1935/06/26, 85 y.o.   MRN: [id number]  HPI This visit occurred during the SARS-CoV-2 public health emergency.  Safety protocols were in place, including screening questions prior to the visit, additional usage of staff PPE, and extensive cleaning of exam room while observing appropriate contact time as indicated for disinfecting solutions.  Patient here for a scheduled follow up.  Here to follow up regarding his blood pressure and dizziness.  Last visit, was seen after ER visit for dizziness. W/up unrevealing.  Has had extensive w/up previously for dizziness.  MRI stable.  Has seen neurology and ent.  Also followed by cardiology.  Was found to be orthostatic on exam.  Chlorthalidone held.  Blood pressure doing better.  Does report feeling some better.  Still reports intermittent episodes of brief dizziness.  Is walking.  More active.  No headache.  No syncope or near syncope.  No chest pain.  Breathing stable.  No acid reflux or abdominal pain reported.  Saw hematology.  Has received IV iron infusions - last 04/06/20.     Past Medical History:  Diagnosis Date  . (HFpEF) heart failure with preserved ejection fraction ([place])    a. 05/2018 Echo: Nl EF, Gr1 DD, sev dil LA, mild MR/TR, mild PAH; b. 05/2019 Echo: EF 55-60%, no rwma, mild LVH. Nl PASP. Mildly dil LA. Triv MR. Mild AoV sclerosis w/o stenosis. Mildly dil Asc AO (28m).  . Allergic rhinitis   . Barrett's esophagus 10/21/13  . Bone cancer ([place]   . CKD (chronic kidney disease), stage III ([place]   . Colon polyp, hyperplastic   . COPD (chronic obstructive pulmonary disease) (HCC)    mild COPD. former smoker  . Coronary artery disease    a. 2003 s/p PCI ([facility]); b. 2004 s/p PCI x 2 ([facility]); c. 11/2017 Cath: LM nl, LAD mild dzs, LCX patent stent w/ 30% distal edge restenosis, RCA patent distal stent w/ 30% prox edge  stenosis. Nl EF->Med Rx.  . Diverticulosis   . Fundic gland polyps of stomach, benign   . Gastritis   . GERD (gastroesophageal reflux disease)   . Hypercholesteremia   . Hypertension   . Prostate cancer ([place]   . Prostatism   . Reflux esophagitis   . Renal stones   . Skin cancer    left cheek/lesion excised  . Sleep apnea   . Tachycardia    a. 11/2017 admit w/ tachycardia/? afib-->no afib noted upon review (amio/OAC d/c'd).  .Marland KitchenTIA (transient ischemic attack)    Past Surgical History:  Procedure Laterality Date  . CARDIAC CATHETERIZATION    . COLONOSCOPY WITH PROPOFOL N/A 09/13/2015   Procedure: COLONOSCOPY WITH PROPOFOL;  Surgeon: MLollie Sails MD;  [facility];  Service: Endoscopy;  Laterality: N/A;  . COLONOSCOPY WITH PROPOFOL N/A 05/07/2019   Procedure: COLONOSCOPY WITH PROPOFOL;  Surgeon: BRobert Bellow MD;  Location: [facility] ENDOSCOPY;  Service: Endoscopy;  Laterality: N/A;  . CORONARY ANGIOPLASTY    . ESOPHAGOGASTRODUODENOSCOPY (EGD) WITH PROPOFOL N/A 09/13/2015   Procedure: ESOPHAGOGASTRODUODENOSCOPY (EGD) WITH PROPOFOL;  Surgeon: MLollie Sails MD;  [facility];  Service: Endoscopy;  Laterality: N/A;  . ESOPHAGOGASTRODUODENOSCOPY (EGD) WITH PROPOFOL N/A 12/28/2015   Procedure: ESOPHAGOGASTRODUODENOSCOPY (EGD) WITH PROPOFOL;  Surgeon: MLollie Sails MD;  [facility];  Service: Endoscopy;  Laterality:  N/A;  . ESOPHAGOGASTRODUODENOSCOPY (EGD) WITH PROPOFOL N/A 06/16/2016   Procedure: ESOPHAGOGASTRODUODENOSCOPY (EGD) WITH PROPOFOL;  Surgeon: Lollie Sails, MD;  [facility];  Service: Endoscopy;  Laterality: N/A;  . ESOPHAGOGASTRODUODENOSCOPY (EGD) WITH PROPOFOL N/A 05/07/2019   Procedure: ESOPHAGOGASTRODUODENOSCOPY (EGD) WITH PROPOFOL;  Surgeon: Robert Bellow, MD;  Location: [facility] ENDOSCOPY;  Service: Endoscopy;  Laterality: N/A;  . EYE SURGERY  2013   CATARACT EXTRACTION  . GANGLION CYST EXCISION Right  05/18/2017   Procedure: REMOVAL GANGLION CYST ANKLE;  Surgeon: Samara Deist, DPM;  Location: [facility] ORS;  Service: Podiatry;  Laterality: Right;  . heart cath stent    . LEFT HEART CATH AND CORONARY ANGIOGRAPHY Right 12/03/2017   Procedure: Left Heart Cath and Coronary Angiography with possible coronary intervention;  Surgeon: Dionisio David, MD;  Location: [facility];  Service: Cardiovascular;  Laterality: Right;  . PROSTATE BIOPSY    . stents     multiple   Family History  Problem Relation Age of Onset  . Cancer Mother        gastric and lung  . Cancer Father        multiple myeloma  . Stroke Father   . Cancer Sister        leukemia  . Cancer Brother        leukemia  . Cancer Brother        kidney  . Cancer Daughter        Uterine  . Cancer Other        [place] (Sister's Son): Prostate   Social History   Socioeconomic History  . Marital status: Married    Spouse name: Not on file  . Number of children: Not on file  . Years of education: Not on file  . Highest education level: Not on file  Occupational History  . Not on file  Tobacco Use  . Smoking status: Former Smoker    Packs/day: 1.00    Years: 35.00    Pack years: 35.00    Types: Cigarettes    Quit date: 03/06/1986    Years since quitting: 34.1  . Smokeless tobacco: Former Systems developer    Types: Secondary school teacher  . Vaping Use: Never used  Substance and Sexual Activity  . Alcohol use: No  . Drug use: No  . Sexual activity: Not Currently  Other Topics Concern  . Not on file  Social History Narrative  . Not on file   Social Determinants of Health   Financial Resource Strain: Not on file  Food Insecurity: Not on file  Transportation Needs: Not on file  Physical Activity: Not on file  Stress: Not on file  Social Connections: Not on file    Outpatient Encounter Medications as of 04/14/2020  Medication Sig  . triamcinolone (KENALOG) 0.1 % Apply 1 application topically 2 (two) times daily.  [name]  amLODipine (NORVASC) 10 MG tablet TAKE 1 TABLET BY MOUTH ONCE DAILY  . aspirin EC 81 MG tablet Take 81 mg by mouth daily.  . calcium citrate-vitamin D (CITRACAL+D) 315-200 MG-UNIT tablet Take 1 tablet by mouth daily.  . chlorthalidone (HYGROTON) 25 MG tablet Take 0.5 tablets (12.5 mg total) by mouth daily. (Patient not taking: No sig reported)  . cholecalciferol (VITAMIN D) 1000 units tablet Take 1,000 Units by mouth daily.   . clopidogrel (PLAVIX) 75 MG tablet Take 1 tablet (75 mg total) by mouth daily.  [name] ezetimibe (ZETIA) 10 MG tablet Take 1 tablet (  10 mg total) by mouth daily. (Patient not taking: No sig reported)  . ferrous sulfate 325 (65 FE) MG tablet Take by mouth.  . fexofenadine (ALLEGRA) 180 MG tablet Take 180 mg by mouth daily as needed for allergies or rhinitis.   . fluticasone (FLONASE) 50 MCG/ACT nasal spray Place 2 sprays into both nostrils daily.   . isosorbide mononitrate (IMDUR) 30 MG 24 hr tablet TAKE 1 TABLET BY MOUTH ONCE DAILY  . losartan (COZAAR) 50 MG tablet Take 1 tablet (50 mg total) by mouth daily.  [name] lovastatin (MEVACOR) 40 MG tablet TAKE 2 TABLETS BY MOUTH AT BEDTIME  . Multiple Vitamin (MULTI-VITAMINS) TABS Take 1 tablet by mouth daily.   . nitroGLYCERIN (NITROSTAT) 0.4 MG SL tablet Place 1 tablet (0.4 mg total) under the tongue every 5 (five) minutes as needed for chest pain.  . pantoprazole (PROTONIX) 40 MG tablet Take 40 mg by mouth 2 (two) times daily.    No facility-administered encounter medications on file as of 04/14/2020.    Review of Systems  Constitutional: Negative for appetite change and unexpected weight change.  HENT: Negative for congestion and sinus pressure.   Respiratory: Negative for cough and chest tightness.        Breathing stable.   Cardiovascular: Negative for chest pain, palpitations and leg swelling.  Gastrointestinal: Negative for abdominal pain, diarrhea, nausea and vomiting.  Genitourinary: Negative for difficulty urinating and  dysuria.  Musculoskeletal: Negative for joint swelling and myalgias.  Skin: Negative for color change and rash.  Neurological: Positive for dizziness and light-headedness. Negative for headaches.  Psychiatric/Behavioral: Negative for agitation and dysphoric mood.       Objective:    Physical Exam Vitals reviewed.  Constitutional:      General: He is not in acute distress.    Appearance: Normal appearance. He is well-developed and well-nourished.  HENT:     Head: Normocephalic and atraumatic.     Right Ear: External ear normal.     Left Ear: External ear normal.  Eyes:     General: No scleral icterus.       Right eye: No discharge.        Left eye: No discharge.     Conjunctiva/sclera: Conjunctivae normal.  Cardiovascular:     Rate and Rhythm: Normal rate and regular rhythm.  Pulmonary:     Effort: Pulmonary effort is normal. No respiratory distress.     Breath sounds: Normal breath sounds.  Abdominal:     General: Bowel sounds are normal.     Palpations: Abdomen is soft.     Tenderness: There is no abdominal tenderness.  Musculoskeletal:        General: No swelling, tenderness or edema.     Cervical back: Neck supple. No tenderness.  Lymphadenopathy:     Cervical: No cervical adenopathy.  Skin:    Findings: No erythema or rash.  Neurological:     Mental Status: He is alert.  Psychiatric:        Mood and Affect: Mood and affect and mood normal.        Behavior: Behavior normal.     BP 112/60   Pulse 81   Temp (!) 97.3 F (36.3 C) (Oral)   Resp 16   Ht '5\' 10"'  (1.778 m)   Wt 219 lb (99.3 kg)   SpO2 98%   BMI 31.42 kg/m  Wt Readings from Last 3 Encounters:  04/14/20 219 lb (99.3 kg)  04/06/20 218 lb (98.9 kg)  04/01/20 216 lb (98 kg)     Lab Results  Component Value Date   WBC 4.9 04/06/2020   HGB 11.5 (L) 04/06/2020   HCT 32.4 (L) 04/06/2020   PLT 192 04/06/2020   GLUCOSE 103 (H) 04/06/2020   CHOL 117 12/08/2019   TRIG 111.0 12/08/2019   HDL  33.60 (L) 12/08/2019   LDLCALC 61 12/08/2019   ALT 14 04/06/2020   AST 23 04/06/2020   NA 141 04/06/2020   K 3.8 04/06/2020   CL 108 04/06/2020   CREATININE 1.30 (H) 04/06/2020   BUN 23 04/06/2020   CO2 26 04/06/2020   TSH 1.82 03/31/2019   PSA 5.47 (H) 08/03/2016   HGBA1C 5.9 12/08/2019    CT Head Wo Contrast  Result Date: 03/30/2020 CLINICAL DATA:  Mental status change EXAM: CT HEAD WITHOUT CONTRAST TECHNIQUE: Contiguous axial images were obtained from the base of the skull through the vertex without intravenous contrast. COMPARISON:  02/03/2019 FINDINGS: Brain: There is no acute intracranial hemorrhage, mass effect, or edema. No new loss of gray-white differentiation. Small chronic right frontal infarct. There is no extra-axial fluid collection. Ventricles and sulci are stable in size and configuration. Vascular: There is atherosclerotic calcification at the skull base. Skull: Calvarium is unremarkable. Sinuses/Orbits: Minor mucosal thickening. Other: None. IMPRESSION: No acute intracranial abnormality. Small chronic right parietal infarct. Electronically Signed   By: Macy Mis M.D.   On: 03/30/2020 08:54   MR BRAIN WO CONTRAST  Result Date: 03/30/2020 CLINICAL DATA:  Mental status change, unknown cause. Additional provided: Patient complains of weakness, feeling as though he is going to fall over, "swim E" head, arm tingling, constipation. EXAM: MRI HEAD WITHOUT CONTRAST TECHNIQUE: Multiplanar, multiecho pulse sequences of the brain and surrounding structures were obtained without intravenous contrast. COMPARISON:  Head CT 03/30/2020.  Brain MRI 01/15/2020. FINDINGS: Brain: Cerebral volume is normal for age. Small chronic right frontal lobe cortical infarct. Subcentimeter focus of T2/FLAIR hyperintensity within the posterior left frontal lobe white matter, likely reflecting minimal chronic small vessel ischemic disease. Redemonstrated punctate focus of SWI signal loss in the left  parietal lobe compatible with chronic microhemorrhage. There is no acute infarct. No evidence of intracranial mass. No chronic intracranial blood products. No extra-axial fluid collection. No midline shift. Vascular: Expected proximal arterial flow voids. Skull and upper cervical spine: No focal marrow lesion Sinuses/Orbits: Visualized orbits show no acute finding. Bilateral lens replacements. Trace ethmoid sinus mucosal thickening. Small right maxillary sinus mucous retention cysts. Other: Trace fluid within the left mastoid air cells. Right TMJ osteoarthrosis IMPRESSION: No evidence of acute intracranial abnormality, including acute infarction. Stable MRI appearance of the brain as compared to the prior exam of 01/15/2020. Small chronic right frontal lobe cortical infarct. Subcentimeter focus of T2 hyperintensity within the posterior left frontal white matter, likely reflecting minimal chronic small vessel ischemic disease. Mild paranasal sinus disease as described. Electronically Signed   By: Kellie Simmering DO   On: 03/30/2020 13:29   DG Chest Portable 1 View  Result Date: 03/30/2020 CLINICAL DATA:  Weakness.  Evaluate for pulmonary edema. EXAM: PORTABLE CHEST 1 VIEW COMPARISON:  04/30/2018 FINDINGS: Heart size is mildly enlarged. Pulmonary vascular congestion. No overt edema, pleural effusion, or airspace consolidation. Calcified granuloma noted in the right upper lobe. IMPRESSION: Cardiac enlargement and pulmonary vascular congestion. Electronically Signed   By: Kerby Moors M.D.   On: 03/30/2020 08:50       Assessment & Plan:   Problem List Items Addressed This  Visit    Abdominal aortic aneurysm (AAA) ([place])    Followed by [facility].  Evaluated 01/2020.  Continues to follow.       Atrial fibrillation, new onset ([place])    Appears to be in SR today.  Follow.       CAD (coronary artery disease)    No chest pain.  Breathing stable.  Continue risk factor modification.       Relevant Orders    Hepatic function panel   Lipid panel   Carotid artery disease ([place])    Saw [facility] 01/2020. s table.  Recommended f/u in one year.       Relevant Orders   Hepatic function panel   Lipid panel   COPD (chronic obstructive pulmonary disease) (HCC)    Breathing stable.       Dizziness    Has continued to be an intermittent issues.  Has had extensive w/up as outlined.  Discussed further w/up and evaluation.  Does feel is not as bad.  Remain off chlorthalidone. Follow.  Wants to monitor.       GERD without esophagitis    No upper symptoms reported.  On protonix.       History of TIA (transient ischemic attack)    Continue aspirin and plavix.       Hypercholesterolemia    On lovastatin.  Low cholesterol diet and exercise.  Follow lipid panel and liver function tests.        Hyperglycemia    Follow met b and a1c.       Hypertension, essential    Orthostatic on exam last visit.  Chlorthalidone held.  Continues on imdur, amlodipine and losartan.  Blood pressure doing better.  Reviewed outside readings - 108-130s/60-70.  Remain off chlorthalidone for now.  Follow pressures.  Follow metabolic panel.       Relevant Orders   TSH   Basic metabolic panel   Iron deficiency anemia due to chronic blood loss    Colonoscopy 05/2019.  Followed by hematology.  Received IV iron.  Follow.       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  Evaluated 01/2020.  Stable.  Recommended f/u scanning in 2 years.       Prostate cancer [facility])    Seeing oncology.  On eligard.       Sleep apnea    Continue cpap.           Einar Pheasant, MD

## 2020-04-16 ENCOUNTER — Telehealth: Payer: Self-pay | Admitting: Internal Medicine

## 2020-04-16 NOTE — Telephone Encounter (Signed)
FYI

## 2020-04-16 NOTE — Telephone Encounter (Signed)
Patient's wife called to give a update on patient 's leg wanted to let Dr.Scott know that his leg is better not as red as before the medication

## 2020-04-16 NOTE — Telephone Encounter (Signed)
Pt aware.

## 2020-04-16 NOTE — Telephone Encounter (Signed)
Thanks for the update. Keep [place] posted.

## 2020-04-18 ENCOUNTER — Encounter: Payer: Self-pay | Admitting: Internal Medicine

## 2020-04-18 NOTE — Assessment & Plan Note (Signed)
Appears to be in SR today.  Follow.  

## 2020-04-18 NOTE — Assessment & Plan Note (Signed)
Saw [facility] 01/2020. s table.  Recommended f/u in one year.

## 2020-04-18 NOTE — Assessment & Plan Note (Signed)
Continue cpap.  

## 2020-04-18 NOTE — Assessment & Plan Note (Signed)
Follow met b and a1c.  

## 2020-04-18 NOTE — Assessment & Plan Note (Signed)
Followed by [facility].  Evaluated 01/2020.  Continues to follow.

## 2020-04-18 NOTE — Assessment & Plan Note (Signed)
No chest pain.  Breathing stable.  Continue risk factor modification.  

## 2020-04-18 NOTE — Assessment & Plan Note (Signed)
No upper symptoms reported.  On protonix.   

## 2020-04-18 NOTE — Assessment & Plan Note (Signed)
Breathing stable.

## 2020-04-18 NOTE — Assessment & Plan Note (Signed)
Colonoscopy 05/2019.  Followed by hematology.  Received IV iron.  Follow.

## 2020-04-18 NOTE — Assessment & Plan Note (Signed)
Has continued to be an intermittent issues.  Has had extensive w/up as outlined.  Discussed further w/up and evaluation.  Does feel is not as bad.  Remain off chlorthalidone. Follow.  Wants to monitor.

## 2020-04-18 NOTE — Assessment & Plan Note (Signed)
Evaluated by [facility].  Evaluated 01/2020.  Stable.  Recommended f/u scanning in 2 years.  

## 2020-04-18 NOTE — Assessment & Plan Note (Signed)
On lovastatin.  Low cholesterol diet and exercise.  Follow lipid panel and liver function tests.   

## 2020-04-18 NOTE — Assessment & Plan Note (Signed)
Seeing oncology.  On eligard.  

## 2020-04-18 NOTE — Assessment & Plan Note (Signed)
Continue aspirin and plavix. 

## 2020-04-18 NOTE — Assessment & Plan Note (Signed)
Orthostatic on exam last visit.  Chlorthalidone held.  Continues on imdur, amlodipine and losartan.  Blood pressure doing better.  Reviewed outside readings - 108-130s/60-70.  Remain off chlorthalidone for now.  Follow pressures.  Follow metabolic panel.

## 2020-05-06 ENCOUNTER — Telehealth: Payer: Self-pay | Admitting: Internal Medicine

## 2020-05-06 NOTE — Telephone Encounter (Signed)
Pt called to let Dr. Nicki Reaper know that he had an appt at [facility] today

## 2020-05-07 NOTE — Telephone Encounter (Signed)
Patients wife stated she left a message to inform dr Nicki Reaper he went since he didn't wish to go originally. She didn't feel like sending a mychart.

## 2020-05-07 NOTE — Telephone Encounter (Signed)
Please call and see if there is more information regarding the appt.  Does he need any f/u , etc?

## 2020-05-18 ENCOUNTER — Other Ambulatory Visit: Payer: Self-pay | Admitting: *Deleted

## 2020-05-18 DIAGNOSIS — D500 Iron deficiency anemia secondary to blood loss (chronic): Secondary | ICD-10-CM

## 2020-05-18 DIAGNOSIS — C61 Malignant neoplasm of prostate: Secondary | ICD-10-CM

## 2020-05-19 ENCOUNTER — Inpatient Hospital Stay: Payer: Medicare HMO | Attending: Internal Medicine

## 2020-05-19 ENCOUNTER — Inpatient Hospital Stay: Payer: Medicare HMO

## 2020-05-19 ENCOUNTER — Inpatient Hospital Stay: Payer: Medicare HMO | Admitting: Internal Medicine

## 2020-05-19 ENCOUNTER — Encounter: Payer: Self-pay | Admitting: Internal Medicine

## 2020-05-19 DIAGNOSIS — C61 Malignant neoplasm of prostate: Secondary | ICD-10-CM

## 2020-05-19 DIAGNOSIS — Z5111 Encounter for antineoplastic chemotherapy: Secondary | ICD-10-CM | POA: Insufficient documentation

## 2020-05-19 DIAGNOSIS — C7951 Secondary malignant neoplasm of bone: Secondary | ICD-10-CM | POA: Diagnosis not present

## 2020-05-19 DIAGNOSIS — D500 Iron deficiency anemia secondary to blood loss (chronic): Secondary | ICD-10-CM

## 2020-05-19 LAB — COMPREHENSIVE METABOLIC PANEL
ALT: 15 U/L (ref 0–44)
AST: 23 U/L (ref 15–41)
Albumin: 4.2 g/dL (ref 3.5–5.0)
Alkaline Phosphatase: 56 U/L (ref 38–126)
Anion gap: 10 (ref 5–15)
BUN: 18 mg/dL (ref 8–23)
CO2: 22 mmol/L (ref 22–32)
Calcium: 9.3 mg/dL (ref 8.9–10.3)
Chloride: 108 mmol/L (ref 98–111)
Creatinine, Ser: 1.17 mg/dL (ref 0.61–1.24)
GFR, Estimated: >60 mL/min (ref >60)
Glucose, Bld: 126 mg/dL — ABNORMAL HIGH (ref 70–99)
Potassium: 3.7 mmol/L (ref 3.5–5.1)
Sodium: 140 mmol/L (ref 135–145)
Total Bilirubin: 0.8 mg/dL (ref 0.3–1.2)
Total Protein: 7.2 g/dL (ref 6.5–8.1)

## 2020-05-19 LAB — CBC WITH DIFFERENTIAL/PLATELET
Abs Immature Granulocytes: 0.01 10*3/uL (ref 0.00–0.07)
Basophils Absolute: 0 10*3/uL (ref 0.0–0.1)
Basophils Relative: 0 %
Eosinophils Absolute: 0.3 10*3/uL (ref 0.0–0.5)
Eosinophils Relative: 5 %
HCT: 38.2 % — ABNORMAL LOW (ref 39.0–52.0)
Hemoglobin: 13.2 g/dL (ref 13.0–17.0)
Immature Granulocytes: 0 %
Lymphocytes Relative: 34 %
Lymphs Abs: 1.6 10*3/uL (ref 0.7–4.0)
MCH: 30.3 pg (ref 26.0–34.0)
MCHC: 34.6 g/dL (ref 30.0–36.0)
MCV: 87.8 fL (ref 80.0–100.0)
Monocytes Absolute: 0.5 10*3/uL (ref 0.1–1.0)
Monocytes Relative: 10 %
Neutro Abs: 2.4 10*3/uL (ref 1.7–7.7)
Neutrophils Relative %: 51 %
Platelets: 208 10*3/uL (ref 150–400)
RBC: 4.35 MIL/uL (ref 4.22–5.81)
RDW: 12.6 % (ref 11.5–15.5)
WBC: 4.8 10*3/uL (ref 4.0–10.5)
nRBC: 0 % (ref 0.0–0.2)

## 2020-05-19 LAB — PSA: Prostatic Specific Antigen: <0.01 ng/mL (ref 0.00–4.00)

## 2020-05-19 MED ORDER — LEUPROLIDE ACETATE (3 MONTH) 22.5 MG ~~LOC~~ KIT
22.5000 mg | PACK | Freq: Once | SUBCUTANEOUS | Status: AC
  Administered 2020-05-19: 22.5 mg via SUBCUTANEOUS
  Filled 2020-05-19: qty 22.5

## 2020-05-19 NOTE — Progress Notes (Signed)
[facility] OFFICE PROGRESS NOTE  Patient Care Team: Einar Pheasant, MD as PCP - General (Internal Medicine) [name], MD as PCP - Cardiology (Cardiology) [name], MD as Consulting Physician (Cardiology)  Cancer Staging No matching staging information was found for the patient.   Oncology History Overview Note  Nov 2017- completed IM RT radiation therapy to his prostate and pelvic nodes for Gleason 7 (4+3) adenocarcinoma the prostate presenting the PSA of 7.8.  # JAN 2019- METASTATIC PROSTATE CA to Bone [low volume met on bone scan; CT- NED]; PSA ~50; March 25th 2019- Lupron 45 mg IM [Urology q 61M]; June 26, 2018-Eligard every 3 months[intolerance to Lupron/question A. Fib/injection site pain  # May 23rd Zytiga 242m/day; no prednisone; STOPPED MARCH 17th 2021-repeated severe hypokalemia;   # Barretts- EGD/ colo [[place]2021; Dr.Byrnett].   # OBenita Stabile [place]]; Oct 2019-paroxysmal A.fib/not on eliquis; [Dr.Khan] -Dr.Arida ; Dizzy spells [Dr.Shah]  ------------------------------------------------------------------  DIAGNOSIS: [ JAN 2019]- Met- PROSTATE CANCER  STAGE:  IV       ;GOALS: PALLIATIVE  CURRENT/MOST RECENT THERAPY [ march 2019]- Lupron; May 23rd 2019- Zytiga 2583mday; discontinued-March 2021.     [facility]    INTERVAL HISTORY:  JaJERL [age]UNYAN89.o.  male pleasant patient above history of metastatic prostate cancer on ADT is here for follow-up.  Patient continues to have episodes of dizzy spells.  He had MRI brain January 2022 again no acute process.  He is awaiting ENT evaluation this week.  Denies any worsening hot flashes.  Denies any nausea vomiting abdominal pain.  Review of Systems  Constitutional: Negative for chills, diaphoresis, fever, malaise/fatigue and weight loss.  HENT: Negative for nosebleeds and sore throat.   Eyes: Negative for double vision.  Respiratory: Negative for cough, hemoptysis,  sputum production, shortness of breath and wheezing.   Cardiovascular: Negative for chest pain, palpitations, orthopnea and leg swelling.  Gastrointestinal: Negative for abdominal pain, blood in stool, constipation, diarrhea, heartburn, melena, nausea and vomiting.  Genitourinary: Negative for dysuria, frequency and urgency.  Musculoskeletal: Positive for myalgias. Negative for back pain and joint pain.  Skin: Negative.  Negative for itching and rash.  Neurological: Positive for dizziness. Negative for tingling, focal weakness, weakness and headaches.  Endo/Heme/Allergies: Does not bruise/bleed easily.  Psychiatric/Behavioral: Negative for depression. The patient is not nervous/anxious and does not have insomnia.     PAST MEDICAL HISTORY :  Past Medical History:  Diagnosis Date  . (HFpEF) heart failure with preserved ejection fraction ([place]   a. 05/2018 Echo: Nl EF, Gr1 DD, sev dil LA, mild MR/TR, mild PAH; b. 05/2019 Echo: EF 55-60%, no rwma, mild LVH. Nl PASP. Mildly dil LA. Triv MR. Mild AoV sclerosis w/o stenosis. Mildly dil Asc AO (3929m  . Allergic rhinitis   . Barrett's esophagus 10/21/13  . [place] . CKD (chronic kidney disease), stage III ([place] . Colon polyp, hyperplastic   . COPD (chronic obstructive pulmonary disease) (HCC)    mild COPD. former smoker  . Coronary artery disease    a. 2003 s/p PCI ([facility]); b. 2004 s/p PCI x 2 ([facility]); c. 11/2017 Cath: LM nl, LAD mild dzs, LCX patent stent w/ 30% distal edge restenosis, RCA patent distal stent w/ 30% prox edge stenosis. Nl EF->Med Rx.  . Diverticulosis   . Fundic gland polyps of stomach, benign   . Gastritis   . GERD (gastroesophageal reflux disease)   . Hypercholesteremia   .  Hypertension   . Prostate cancer ([place])   . Prostatism   . Reflux esophagitis   . Renal stones   . Skin cancer    left cheek/lesion excised  . Sleep apnea   . Tachycardia    a. 11/2017 admit w/ tachycardia/? afib-->no afib noted upon  review (amio/OAC d/c'd).  [name] TIA (transient ischemic attack)     PAST SURGICAL HISTORY :   Past Surgical History:  Procedure Laterality Date  . CARDIAC CATHETERIZATION    . COLONOSCOPY WITH PROPOFOL N/A 09/13/2015   Procedure: COLONOSCOPY WITH PROPOFOL;  Surgeon: Lollie Sails, MD;  Location: [facility] ENDOSCOPY;  Service: Endoscopy;  Laterality: N/A;  . COLONOSCOPY WITH PROPOFOL N/A 05/07/2019   Procedure: COLONOSCOPY WITH PROPOFOL;  Surgeon: Robert Bellow, MD;  Location: [facility] ENDOSCOPY;  Service: Endoscopy;  Laterality: N/A;  . CORONARY ANGIOPLASTY    . ESOPHAGOGASTRODUODENOSCOPY (EGD) WITH PROPOFOL N/A 09/13/2015   Procedure: ESOPHAGOGASTRODUODENOSCOPY (EGD) WITH PROPOFOL;  Surgeon: Lollie Sails, MD;  Location[facility] ENDOSCOPY;  Service: Endoscopy;  Laterality: N/A;  . ESOPHAGOGASTRODUODENOSCOPY (EGD) WITH PROPOFOL N/A 12/28/2015   Procedure: ESOPHAGOGASTRODUODENOSCOPY (EGD) WITH PROPOFOL;  Surgeon: Lollie Sails, MD;  Location: [facility] ENDOSCOPY;  Service: Endoscopy;  Laterality: N/A;  . ESOPHAGOGASTRODUODENOSCOPY (EGD) WITH PROPOFOL N/A 06/16/2016   Procedure: ESOPHAGOGASTRODUODENOSCOPY (EGD) WITH PROPOFOL;  Surgeon: Lollie Sails, MD;  Location: [facility] ENDOSCOPY;  Service: Endoscopy;  Laterality: N/A;  . ESOPHAGOGASTRODUODENOSCOPY (EGD) WITH PROPOFOL N/A 05/07/2019   Procedure: ESOPHAGOGASTRODUODENOSCOPY (EGD) WITH PROPOFOL;  Surgeon: Robert Bellow, MD;  Location: [facility] ENDOSCOPY;  Service: Endoscopy;  Laterality: N/A;  . EYE SURGERY  2013   CATARACT EXTRACTION  . GANGLION CYST EXCISION Right 05/18/2017   Procedure: REMOVAL GANGLION CYST ANKLE;  Surgeon: Samara Deist, DPM;  Location: [facility] ORS;  Service: Podiatry;  Laterality: Right;  . heart cath stent    . LEFT HEART CATH AND CORONARY ANGIOGRAPHY Right 12/03/2017   Procedure: Left Heart Cath and Coronary Angiography with possible coronary intervention;  Surgeon: Dionisio David, MD;  Location: [facility] CV LAB;  Service:  Cardiovascular;  Laterality: Right;  . PROSTATE BIOPSY    . stents     multiple    FAMILY HISTORY :   Family History  Problem Relation Age of Onset  . Cancer Mother        gastric and lung  . Cancer Father        multiple myeloma  . Stroke Father   . Cancer Sister        leukemia  . Cancer Brother        leukemia  . Cancer Brother        kidney  . Cancer Daughter        Uterine  . Cancer Other        Nephes (Sister's Son): Prostate    SOCIAL HISTORY:   Social History   Tobacco Use  . Smoking status: Former Smoker    Packs/day: 1.00    Years: 35.00    Pack years: 35.00    Types: Cigarettes    Quit date: 03/06/1986    Years since quitting: 34.2  . Smokeless tobacco: Former Systems developer    Types: Secondary school teacher  . Vaping Use: Never used  Substance Use Topics  . Alcohol use: No  . Drug use: No    ALLERGIES:  is allergic to atorvastatin.  MEDICATIONS:  Current Outpatient Medications  Medication Sig Dispense Refill  . amLODipine (NORVASC) 10 MG tablet  TAKE 1 TABLET BY MOUTH ONCE DAILY 90 tablet 1  . aspirin EC 81 MG tablet Take 81 mg by mouth daily.    . calcium citrate-vitamin D (CITRACAL+D) 315-200 MG-UNIT tablet Take 1 tablet by mouth daily.    . cholecalciferol (VITAMIN D) 1000 units tablet Take 1,000 Units by mouth daily.     . clopidogrel (PLAVIX) 75 MG tablet Take 1 tablet (75 mg total) by mouth daily. 90 tablet 3  . ferrous sulfate 325 (65 FE) MG tablet Take by mouth.    . fexofenadine (ALLEGRA) 180 MG tablet Take 180 mg by mouth daily as needed for allergies or rhinitis.     . fluticasone (FLONASE) 50 MCG/ACT nasal spray Place 2 sprays into both nostrils daily.     . isosorbide mononitrate (IMDUR) 30 MG 24 hr tablet TAKE 1 TABLET BY MOUTH ONCE DAILY 90 tablet 2  . losartan (COZAAR) 50 MG tablet Take 1 tablet (50 mg total) by mouth daily. 90 tablet 1  . lovastatin (MEVACOR) 40 MG tablet TAKE 2 TABLETS BY MOUTH AT BEDTIME 180 tablet 1  . Multiple Vitamin  (MULTI-VITAMINS) TABS Take 1 tablet by mouth daily.     . nitroGLYCERIN (NITROSTAT) 0.4 MG SL tablet Place 1 tablet (0.4 mg total) under the tongue every 5 (five) minutes as needed for chest pain. 25 tablet 0  . pantoprazole (PROTONIX) 40 MG tablet Take 40 mg by mouth 2 (two) times daily.      No current facility-administered medications for this visit.    PHYSICAL EXAMINATION: ECOG PERFORMANCE STATUS: 0 - Asymptomatic  BP 110/66 (BP Location: Left Arm, Patient Position: Sitting, Cuff Size: Normal)   Pulse 73   Temp (!) 97.4 F (36.3 C) (Tympanic)   Resp 16   Ht '5\' 10"'  (1.778 m)   Wt 216 lb 12.8 oz (98.3 kg)   SpO2 100%   BMI 31.11 kg/m   Filed Weights   05/19/20 1327  Weight: 216 lb 12.8 oz (98.3 kg)    Physical Exam Constitutional:      Comments: Patient is with his wife.  He is walking independently.  HENT:     Head: Normocephalic and atraumatic.     Mouth/Throat:     Pharynx: No oropharyngeal exudate.  Eyes:     Pupils: Pupils are equal, round, and reactive to light.  Cardiovascular:     Rate and Rhythm: Normal rate and regular rhythm.  Pulmonary:     Effort: No respiratory distress.     Breath sounds: No wheezing.  Abdominal:     General: Bowel sounds are normal. There is no distension.     Palpations: Abdomen is soft. There is no mass.     Tenderness: There is no abdominal tenderness. There is no guarding or rebound.  Musculoskeletal:        General: No tenderness. Normal range of motion.     Cervical back: Normal range of motion and neck supple.  Skin:    General: Skin is warm.  Neurological:     Mental Status: He is alert and oriented to person, place, and time.  Psychiatric:        Mood and Affect: Affect normal.     LABORATORY DATA:  I have reviewed the data as listed    Component Value Date/Time   NA 140 05/19/2020 1311   NA 140 09/16/2013 0438   K 3.7 05/19/2020 1311   K 4.2 09/16/2013 0438   CL 108 05/19/2020 1311  CL 109 (H) 09/16/2013  0438   CO2 22 05/19/2020 1311   CO2 24 09/16/2013 0438   GLUCOSE 126 (H) 05/19/2020 1311   GLUCOSE 101 (H) 09/16/2013 0438   BUN 18 05/19/2020 1311   BUN 20 (H) 09/16/2013 0438   CREATININE 1.17 05/19/2020 1311   CREATININE 1.28 09/16/2013 0438   CALCIUM 9.3 05/19/2020 1311   CALCIUM 8.3 (L) 09/16/2013 0438   PROT 7.2 05/19/2020 1311   PROT 6.7 09/15/2013 0755   ALBUMIN 4.2 05/19/2020 1311   ALBUMIN 3.5 09/15/2013 0755   AST 23 05/19/2020 1311   AST 37 09/15/2013 0755   ALT 15 05/19/2020 1311   ALT 28 09/15/2013 0755   ALKPHOS 56 05/19/2020 1311   ALKPHOS 44 (L) 09/15/2013 0755   BILITOT 0.8 05/19/2020 1311   BILITOT 0.5 09/15/2013 0755   GFRNONAA >60 05/19/2020 1311   GFRNONAA 54 (L) 09/16/2013 0438   GFRAA 59 (L) 10/07/2019 1305   GFRAA >60 09/16/2013 0438    No results found for: SPEP, UPEP  Lab Results  Component Value Date   WBC 4.8 05/19/2020   NEUTROABS 2.4 05/19/2020   HGB 13.2 05/19/2020   HCT 38.2 (L) 05/19/2020   MCV 87.8 05/19/2020   PLT 208 05/19/2020      Chemistry      Component Value Date/Time   NA 140 05/19/2020 1311   NA 140 09/16/2013 0438   K 3.7 05/19/2020 1311   K 4.2 09/16/2013 0438   CL 108 05/19/2020 1311   CL 109 (H) 09/16/2013 0438   CO2 22 05/19/2020 1311   CO2 24 09/16/2013 0438   BUN 18 05/19/2020 1311   BUN 20 (H) 09/16/2013 0438   CREATININE 1.17 05/19/2020 1311   CREATININE 1.28 09/16/2013 0438      Component Value Date/Time   CALCIUM 9.3 05/19/2020 1311   CALCIUM 8.3 (L) 09/16/2013 0438   ALKPHOS 56 05/19/2020 1311   ALKPHOS 44 (L) 09/15/2013 0755   AST 23 05/19/2020 1311   AST 37 09/15/2013 0755   ALT 15 05/19/2020 1311   ALT 28 09/15/2013 0755   BILITOT 0.8 05/19/2020 1311   BILITOT 0.5 09/15/2013 0755       RADIOGRAPHIC STUDIES: I have personally reviewed the radiological images as listed and agreed with the findings in the report. No results found.   ASSESSMENT & PLAN:  Prostate cancer ([place]) # Castrate  sensitive-metastatic prostate cancer to the bone. Stage IV-on Eligard; Bone scan October 2020-improved;  FEB 2022- PSA- <0.1.  Eligard today. [22.18m q3M]  # Iron deficient anemia/CKD- III-[s/p EGD March 2021]--status post IV iron infusion x1 [FEB 2022]-today hemoglobin is 13.  Improved.  Hold off on infusion.  # Dizzy spells- ? Seizures [s/p EEG; Dr.Shah]; MRI brain-no evidence of stroke.  Awaiting ENT evaluation.   # A. fib paroxysmal-sinus rhythm; aspirin Plavix- STABLE.  # DISPOSITION:  # Eligard today; HOLD  Venofer # Follow up in 3 months; MD;labs- cbc.cmp/PSA eligard; possible venofer- Dr.B  Cc; Dr.Scott/    No orders of the defined types were placed in this encounter.  All questions were answered. The patient knows to call the clinic with any problems, questions or concerns.      GCammie Sickle MD 05/19/2020 7:41 PM

## 2020-05-19 NOTE — Assessment & Plan Note (Addendum)
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 Stage IV-on Eligard; Bone scan October 2020-improved;  FEB 2022- PSA- <0.1.  Eligard today. [22.5mg  q3M]  # Iron deficient anemia/CKD- III-[s/p EGD March 2021]--status post IV iron infusion x1 [FEB 2022]-today hemoglobin is 13.  Improved.  Hold off on infusion.  # Dizzy spells- ? Seizures [s/p EEG; Dr.Shah]; MRI brain-no evidence of stroke.  Awaiting ENT evaluation.   # A. fib paroxysmal-sinus rhythm; aspirin Plavix- STABLE.  # DISPOSITION:  # Eligard today; HOLD  Venofer # Follow up in 3 months; MD;labs- cbc.cmp/PSA eligard; possible venofer- Dr.B  Cc; Dr.Scott/

## 2020-06-17 ENCOUNTER — Other Ambulatory Visit (INDEPENDENT_AMBULATORY_CARE_PROVIDER_SITE_OTHER): Payer: Medicare HMO

## 2020-06-17 ENCOUNTER — Other Ambulatory Visit: Payer: Self-pay

## 2020-06-17 DIAGNOSIS — R739 Hyperglycemia, unspecified: Secondary | ICD-10-CM | POA: Diagnosis not present

## 2020-06-17 DIAGNOSIS — I2510 Atherosclerotic heart disease of native coronary artery without angina pectoris: Secondary | ICD-10-CM | POA: Diagnosis not present

## 2020-06-17 DIAGNOSIS — I10 Essential (primary) hypertension: Secondary | ICD-10-CM

## 2020-06-17 DIAGNOSIS — I779 Disorder of arteries and arterioles, unspecified: Secondary | ICD-10-CM | POA: Diagnosis not present

## 2020-06-17 LAB — BASIC METABOLIC PANEL
BUN: 20 mg/dL (ref 6–23)
CO2: 27 mEq/L (ref 19–32)
Calcium: 9.4 mg/dL (ref 8.4–10.5)
Chloride: 106 mEq/L (ref 96–112)
Creatinine, Ser: 1.42 mg/dL (ref 0.40–1.50)
GFR: 46.01 mL/min — ABNORMAL LOW (ref >60.00)
Glucose, Bld: 97 mg/dL (ref 70–99)
Potassium: 4.3 mEq/L (ref 3.5–5.1)
Sodium: 141 mEq/L (ref 135–145)

## 2020-06-17 LAB — LIPID PANEL
Cholesterol: 121 mg/dL (ref 0–200)
HDL: 33.4 mg/dL — ABNORMAL LOW (ref >39.00)
LDL Cholesterol: 68 mg/dL (ref 0–99)
NonHDL: 87.33
Total CHOL/HDL Ratio: 4
Triglycerides: 98 mg/dL (ref 0.0–149.0)
VLDL: 19.6 mg/dL (ref 0.0–40.0)

## 2020-06-17 LAB — HEPATIC FUNCTION PANEL
ALT: 11 U/L (ref 0–53)
AST: 16 U/L (ref 0–37)
Albumin: 3.9 g/dL (ref 3.5–5.2)
Alkaline Phosphatase: 58 U/L (ref 39–117)
Bilirubin, Direct: 0.1 mg/dL (ref 0.0–0.3)
Total Bilirubin: 0.7 mg/dL (ref 0.2–1.2)
Total Protein: 6.5 g/dL (ref 6.0–8.3)

## 2020-06-17 LAB — TSH: TSH: 2.15 u[IU]/mL (ref 0.35–4.50)

## 2020-06-17 LAB — HEMOGLOBIN A1C: Hgb A1c MFr Bld: 5.8 % (ref 4.6–6.5)

## 2020-06-22 ENCOUNTER — Ambulatory Visit (INDEPENDENT_AMBULATORY_CARE_PROVIDER_SITE_OTHER): Payer: Medicare HMO | Admitting: Internal Medicine

## 2020-06-22 ENCOUNTER — Other Ambulatory Visit: Payer: Self-pay

## 2020-06-22 ENCOUNTER — Encounter: Payer: Self-pay | Admitting: Internal Medicine

## 2020-06-22 DIAGNOSIS — I10 Essential (primary) hypertension: Secondary | ICD-10-CM

## 2020-06-22 DIAGNOSIS — G4730 Sleep apnea, unspecified: Secondary | ICD-10-CM

## 2020-06-22 DIAGNOSIS — K219 Gastro-esophageal reflux disease without esophagitis: Secondary | ICD-10-CM

## 2020-06-22 DIAGNOSIS — R739 Hyperglycemia, unspecified: Secondary | ICD-10-CM

## 2020-06-22 DIAGNOSIS — K2270 Barrett's esophagus without dysplasia: Secondary | ICD-10-CM

## 2020-06-22 DIAGNOSIS — I779 Disorder of arteries and arterioles, unspecified: Secondary | ICD-10-CM | POA: Diagnosis not present

## 2020-06-22 DIAGNOSIS — I5032 Chronic diastolic (congestive) heart failure: Secondary | ICD-10-CM

## 2020-06-22 DIAGNOSIS — D500 Iron deficiency anemia secondary to blood loss (chronic): Secondary | ICD-10-CM

## 2020-06-22 DIAGNOSIS — I2510 Atherosclerotic heart disease of native coronary artery without angina pectoris: Secondary | ICD-10-CM | POA: Diagnosis not present

## 2020-06-22 DIAGNOSIS — J449 Chronic obstructive pulmonary disease, unspecified: Secondary | ICD-10-CM | POA: Diagnosis not present

## 2020-06-22 DIAGNOSIS — N1831 Chronic kidney disease, stage 3a: Secondary | ICD-10-CM

## 2020-06-22 DIAGNOSIS — R42 Dizziness and giddiness: Secondary | ICD-10-CM

## 2020-06-22 DIAGNOSIS — E7800 Pure hypercholesterolemia, unspecified: Secondary | ICD-10-CM

## 2020-06-22 NOTE — Progress Notes (Signed)
Patient ID: Nathaniel Thompson, male   DOB: 06/24/1935, 85 y.o.   MRN: [id number]   Virtual Visit via telephone Note  This visit type was conducted due to national recommendations for restrictions regarding the COVID-19 pandemic (e.g. social distancing).  This format is felt to be most appropriate for this patient at this time.  All issues noted in this document were discussed and addressed.  No physical exam was performed (except for noted visual exam findings with Video Visits).   I connected with Ermalinda Memos by telephone and verified that I am speaking with the correct person using two identifiers. Location patient: home Location provider:  home office Persons participating in the telephone visit: patient, provider and pts wife  The limitations, risks, security and privacy concerns of performing an evaluation and management service by telephone and the availability of in person appointments have been discussed.  It has also been discussed with the patient that there may be a patient responsible charge related to this service. The patient expressed understanding and agreed to proceed.   Reason for visit: scheduled follow up.    HPI: He has been evaluated recently for persistent intermittent dizziness.  He has been evaluated by cardiology, neurology and ENT.  Most recent visit was with ENT.  States everything checked out ok.  Had a couple of episodes after ENT visit, but since - has not had dizziness.  Has been working out in his building.  Trying to stay active.  Feels better.  No chest pain.  Breathing stable.  Blood pressure doing well:  131/72 and 125/76.  Has f/u with neurology 11/2020.  He is walking.  Eating.  No nausea or vomiting.  Bowels moving.  Discussed recent labs.  Discussed decreased GFR.  Discussed continued avoidance of antiinflammatories and staying hydrated.  Previous rash.  Noticed last week.  Localized left lower leg.  Used a previous prescribed cream.  Resolved.  No rash now.   No fever.  No headache.  No other rash.  No bites.    ROS: See pertinent positives and negatives per HPI.  Past Medical History:  Diagnosis Date  . (HFpEF) heart failure with preserved ejection fraction ([place])    a. 05/2018 Echo: Nl EF, Gr1 DD, sev dil LA, mild MR/TR, mild PAH; b. 05/2019 Echo: EF 55-60%, no rwma, mild LVH. Nl PASP. Mildly dil LA. Triv MR. Mild AoV sclerosis w/o stenosis. Mildly dil Asc AO (3m).  . Allergic rhinitis   . Barrett's esophagus 10/21/13  . [place] cancer ([place]   . CKD (chronic kidney disease), stage III ([place]   . Colon polyp, hyperplastic   . COPD (chronic obstructive pulmonary disease) (HCC)    mild COPD. former smoker  . Coronary artery disease    a. 2003 s/p PCI ([facility]); b. 2004 s/p PCI x 2 ([facility]); c. 11/2017 Cath: LM nl, LAD mild dzs, LCX patent stent w/ 30% distal edge restenosis, RCA patent distal stent w/ 30% prox edge stenosis. Nl EF->Med Rx.  . Diverticulosis   . Fundic gland polyps of stomach, benign   . Gastritis   . GERD (gastroesophageal reflux disease)   . Hypercholesteremia   . Hypertension   . Prostate cancer ([place]   . Prostatism   . Reflux esophagitis   . Renal stones   . Skin cancer    left cheek/lesion excised  . Sleep apnea   . Tachycardia    a. 11/2017 admit w/ tachycardia/? afib-->no afib noted upon review (amio/OAC d/c'd).  .[name]  TIA (transient ischemic attack)     Past Surgical History:  Procedure Laterality Date  . CARDIAC CATHETERIZATION    . COLONOSCOPY WITH PROPOFOL N/A 09/13/2015   Procedure: COLONOSCOPY WITH PROPOFOL;  Surgeon: Lollie Sails, MD;  Location: [facility] ENDOSCOPY;  Service: Endoscopy;  Laterality: N/A;  . COLONOSCOPY WITH PROPOFOL N/A 05/07/2019   Procedure: COLONOSCOPY WITH PROPOFOL;  Surgeon: Robert Bellow, MD;  Location: [facility] ENDOSCOPY;  Service: Endoscopy;  Laterality: N/A;  . CORONARY ANGIOPLASTY    . ESOPHAGOGASTRODUODENOSCOPY (EGD) WITH PROPOFOL N/A 09/13/2015   Procedure: ESOPHAGOGASTRODUODENOSCOPY (EGD)  WITH PROPOFOL;  Surgeon: Lollie Sails, MD;  Location: [facility] ENDOSCOPY;  Service: Endoscopy;  Laterality: N/A;  . ESOPHAGOGASTRODUODENOSCOPY (EGD) WITH PROPOFOL N/A 12/28/2015   Procedure: ESOPHAGOGASTRODUODENOSCOPY (EGD) WITH PROPOFOL;  Surgeon: Lollie Sails, MD;  Location: [facility] ENDOSCOPY;  Service: Endoscopy;  Laterality: N/A;  . ESOPHAGOGASTRODUODENOSCOPY (EGD) WITH PROPOFOL N/A 06/16/2016   Procedure: ESOPHAGOGASTRODUODENOSCOPY (EGD) WITH PROPOFOL;  Surgeon: Lollie Sails, MD;  Location: [facility] ENDOSCOPY;  Service: Endoscopy;  Laterality: N/A;  . ESOPHAGOGASTRODUODENOSCOPY (EGD) WITH PROPOFOL N/A 05/07/2019   Procedure: ESOPHAGOGASTRODUODENOSCOPY (EGD) WITH PROPOFOL;  Surgeon: Robert Bellow, MD;  Location: [facility] ENDOSCOPY;  Service: Endoscopy;  Laterality: N/A;  . EYE SURGERY  2013   CATARACT EXTRACTION  . GANGLION CYST EXCISION Right 05/18/2017   Procedure: REMOVAL GANGLION CYST ANKLE;  Surgeon: Samara Deist, DPM;  Location: [facility] ORS;  Service: Podiatry;  Laterality: Right;  . heart cath stent    . LEFT HEART CATH AND CORONARY ANGIOGRAPHY Right 12/03/2017   Procedure: Left Heart Cath and Coronary Angiography with possible coronary intervention;  Surgeon: Dionisio David, MD;  Location: [place] CV LAB;  Service: Cardiovascular;  Laterality: Right;  . PROSTATE BIOPSY    . stents     multiple    Family History  Problem Relation Age of Onset  . Cancer Mother        gastric and lung  . Cancer Father        multiple myeloma  . Stroke Father   . Cancer Sister        leukemia  . Cancer Brother        leukemia  . Cancer Brother        kidney  . Cancer Daughter        Uterine  . Cancer Other        Nephes (Sister's Son): Prostate    SOCIAL HX: reviewed.    Current Outpatient Medications:  .  amLODipine (NORVASC) 10 MG tablet, TAKE 1 TABLET BY MOUTH ONCE DAILY, Disp: 90 tablet, Rfl: 1 .  aspirin EC 81 MG tablet, Take 81 mg by mouth daily., Disp: , Rfl:  .   calcium citrate-vitamin D (CITRACAL+D) 315-200 MG-UNIT tablet, Take 1 tablet by mouth daily., Disp: , Rfl:  .  cholecalciferol (VITAMIN D) 1000 units tablet, Take 1,000 Units by mouth daily. , Disp: , Rfl:  .  clopidogrel (PLAVIX) 75 MG tablet, Take 1 tablet (75 mg total) by mouth daily., Disp: 90 tablet, Rfl: 3 .  ferrous sulfate 325 (65 FE) MG tablet, Take by mouth., Disp: , Rfl:  .  fexofenadine (ALLEGRA) 180 MG tablet, Take 180 mg by mouth daily as needed for allergies or rhinitis. , Disp: , Rfl:  .  fluticasone (FLONASE) 50 MCG/ACT nasal spray, Place 2 sprays into both nostrils daily. , Disp: , Rfl:  .  isosorbide mononitrate (IMDUR) 30 MG 24 hr tablet, TAKE 1 TABLET BY  MOUTH ONCE DAILY, Disp: 90 tablet, Rfl: 2 .  losartan (COZAAR) 50 MG tablet, Take 1 tablet (50 mg total) by mouth daily., Disp: 90 tablet, Rfl: 1 .  lovastatin (MEVACOR) 40 MG tablet, TAKE 2 TABLETS BY MOUTH AT BEDTIME, Disp: 180 tablet, Rfl: 1 .  Multiple Vitamin (MULTI-VITAMINS) TABS, Take 1 tablet by mouth daily. , Disp: , Rfl:  .  pantoprazole (PROTONIX) 40 MG tablet, Take 40 mg by mouth 2 (two) times daily. , Disp: , Rfl:   EXAM:  VITALS per patient if applicable:131/72, 982/64  GENERAL: alert. Sounds to be in no acute distress.  Answering questions appropriately   PSYCH/NEURO: pleasant and cooperative, no obvious depression or anxiety, speech and thought processing grossly intact  ASSESSMENT AND PLAN:  Discussed the following assessment and plan:  Problem List Items Addressed This Visit    Barrett's esophagus    Followed by GI.  EGD 05/2019.  Continue PPI.       CAD (coronary artery disease)    No chest pain.  Breathing stable.  Continue risk factor modification.       Carotid artery disease ([place])    Saw [facility] 01/2020.  Stable.  Recommended f/u in one year.       Chronic heart failure with preserved ejection fraction ([place])    Followed by cardiology.  ECHO - 05/2019 - EF 55-60%.  Continues on losartan,  imdur and amlodipine.  Blood pressure doing well.  Stable.       CKD (chronic kidney disease)    Discussed recent labs and decreased GFR.  Discussed importance of staying hydrated.  Discussed avoiding antiinflammatories.  Follow metabolic panel.        Relevant Orders   Basic metabolic panel   Urinalysis, Routine w reflex microscopic   COPD (chronic obstructive pulmonary disease) (HCC)    Breathing stable.       Dizziness    Has been evaluated and worked up by cardiology, neurology and ENT.  Extensive w/up.  Since the couple of episodes after his visit with ENT, has not had dizziness.  Feels better.  Has f/u with neurology 11/2020.  Feels stable currently.  Will follow.  Blood pressure doing well.  Walking.  Notify if recurring problems.       GERD without esophagitis    No upper symptoms reported.  Continue protonix.       Hypercholesterolemia    Continue lovastatin.  Follow lipid panel and liver function tests.        Hyperglycemia    Follow met b and a1c.       Hypertension, essential    Blood pressure doing well as outlined.  Continue imdur, amlodipine and losartan.  Continue to follow blood pressure.  Continue to monitor metabolic panel.        Iron deficiency anemia due to chronic blood loss    Had colonoscopy and EGD 2021.  Continue f/u with hematology.       Sleep apnea    Continue cpap.           I discussed the assessment and treatment plan with the patient. The patient was provided an opportunity to ask questions and all were answered. The patient agreed with the plan and demonstrated an understanding of the instructions.   The patient was advised to call back or seek an in-person evaluation if the symptoms worsen or if the condition fails to improve as anticipated.  I provided 23 minutes of non-face-to-face time during this encounter.  Porter Nakama, MD  

## 2020-06-23 ENCOUNTER — Encounter: Payer: Self-pay | Admitting: Internal Medicine

## 2020-06-23 DIAGNOSIS — I5032 Chronic diastolic (congestive) heart failure: Secondary | ICD-10-CM | POA: Insufficient documentation

## 2020-06-23 NOTE — Assessment & Plan Note (Signed)
Discussed recent labs and decreased GFR.  Discussed importance of staying hydrated.  Discussed avoiding antiinflammatories.  Follow metabolic panel.

## 2020-06-23 NOTE — Assessment & Plan Note (Signed)
Followed by cardiology.  ECHO - 05/2019 - EF 55-60%.  Continues on losartan, imdur and amlodipine.  Blood pressure doing well.  Stable.  

## 2020-06-23 NOTE — Assessment & Plan Note (Signed)
No chest pain.  Breathing stable.  Continue risk factor modification.

## 2020-06-23 NOTE — Assessment & Plan Note (Signed)
Saw [facility] 01/2020.  Stable.  Recommended f/u in one year.

## 2020-06-23 NOTE — Assessment & Plan Note (Signed)
Followed by GI.  EGD 05/2019.  Continue PPI.

## 2020-06-23 NOTE — Assessment & Plan Note (Signed)
Continue lovastatin.  Follow lipid panel and liver function tests.   

## 2020-06-23 NOTE — Assessment & Plan Note (Signed)
Breathing stable.

## 2020-06-23 NOTE — Assessment & Plan Note (Signed)
Blood pressure doing well as outlined.  Continue imdur, amlodipine and losartan.  Continue to follow blood pressure.  Continue to monitor metabolic panel.

## 2020-06-23 NOTE — Assessment & Plan Note (Signed)
Has been evaluated and worked up by cardiology, neurology and ENT.  Extensive w/up.  Since the couple of episodes after his visit with ENT, has not had dizziness.  Feels better.  Has f/u with neurology 11/2020.  Feels stable currently.  Will follow.  Blood pressure doing well.  Walking.  Notify if recurring problems.

## 2020-06-23 NOTE — Assessment & Plan Note (Signed)
Had colonoscopy and EGD 2021.  Continue f/u with hematology.

## 2020-06-23 NOTE — Assessment & Plan Note (Signed)
Continue cpap.  

## 2020-06-23 NOTE — Assessment & Plan Note (Signed)
Follow met b and a1c.  

## 2020-06-23 NOTE — Assessment & Plan Note (Signed)
No upper symptoms reported.  Continue protonix.  

## 2020-07-13 ENCOUNTER — Other Ambulatory Visit: Payer: Self-pay | Admitting: Internal Medicine

## 2020-07-16 ENCOUNTER — Ambulatory Visit (INDEPENDENT_AMBULATORY_CARE_PROVIDER_SITE_OTHER): Payer: Medicare HMO

## 2020-07-16 VITALS — Ht 70.0 in | Wt 215.0 lb

## 2020-07-16 DIAGNOSIS — Z0000 Encounter for general adult medical examination without abnormal findings: Secondary | ICD-10-CM

## 2020-07-16 NOTE — Progress Notes (Signed)
Subjective:   Nathaniel Thompson is a 85 y.o. male who presents for Medicare Annual/Subsequent preventive examination.  Review of Systems    No ROS.  Medicare Wellness Virtual Visit.   Cardiac Risk Factors include: advanced age (>71mn, >>45women);male gender     Objective:    Today's Vitals   07/16/20 0828  Weight: 215 lb (97.5 kg)  Height: _0  (1.778 m)   Body mass index is 30.85 kg/m.  Advanced Directives 07/16/2020 04/06/2020 03/30/2020 07/16/2019 07/01/2019 05/07/2019 04/22/2019  Does Patient Have a Medical Advance Directive? Yes No No No No Yes No  Type of AParamedicof AHendersonLiving will - - - - HPress photographerLiving will -  Does patient want to make changes to medical advance directive? No - Patient declined - - - - - -  Copy of HSevernin Chart? No - copy requested - - - - - -  Would patient like information on creating a medical advance directive? - No - Patient declined No - Patient declined No - Patient declined No - Patient declined - No - Patient declined    Current Medications (verified) Outpatient Encounter Medications as of 07/16/2020  Medication Sig  . amLODipine (NORVASC) 10 MG tablet TAKE 1 TABLET BY MOUTH ONCE DAILY  . aspirin EC 81 MG tablet Take 81 mg by mouth daily.  . calcium citrate-vitamin D (CITRACAL+D) 315-200 MG-UNIT tablet Take 1 tablet by mouth daily.  . cholecalciferol (VITAMIN D) 1000 units tablet Take 1,000 Units by mouth daily.   . clopidogrel (PLAVIX) 75 MG tablet Take 1 tablet (75 mg total) by mouth daily.  . ferrous sulfate 325 (65 FE) MG tablet Take by mouth.  . fexofenadine (ALLEGRA) 180 MG tablet Take 180 mg by mouth daily as needed for allergies or rhinitis.   . fluticasone (FLONASE) 50 MCG/ACT nasal spray Place 2 sprays into both nostrils daily.   . isosorbide mononitrate (IMDUR) 30 MG 24 hr tablet TAKE 1 TABLET BY MOUTH ONCE DAILY  . losartan (COZAAR) 50 MG tablet Take 1 tablet  (50 mg total) by mouth daily.  .Marland Kitchenlovastatin (MEVACOR) 40 MG tablet TAKE 2 TABLETS BY MOUTH AT BEDTIME  . Multiple Vitamin (MULTI-VITAMINS) TABS Take 1 tablet by mouth daily.   . pantoprazole (PROTONIX) 40 MG tablet Take 40 mg by mouth 2 (two) times daily.    No facility-administered encounter medications on file as of 07/16/2020.    Allergies (verified) Atorvastatin   History: Past Medical History:  Diagnosis Date  . (HFpEF) heart failure with preserved ejection fraction ([place]    a. 05/2018 Echo: Nl EF, Gr1 DD, sev dil LA, mild MR/TR, mild PAH; b. 05/2019 Echo: EF 55-60%, no rwma, mild LVH. Nl PASP. Mildly dil LA. Triv MR. Mild AoV sclerosis w/o stenosis. Mildly dil Asc AO (393m.  . Allergic rhinitis   . Barrett's esophagus 10/21/13  . Bone cancer ([place]CE[place]  . CKD (chronic kidney disease), stage III (HCLuna  . Colon polyp, hyperplastic   . COPD (chronic obstructive pulmonary disease) (HCC)    mild COPD. former smoker  . Coronary artery disease    a. 2003 s/p PCI ([facility]); b. 2004 s/p PCI x 2 ([facility]); c. 11/2017 Cath: LM nl, LAD mild dzs, LCX patent stent w/ 30% distal edge restenosis, RCA patent distal stent w/ 30% prox edge stenosis. Nl EF->Med Rx.  . Diverticulosis   . Fundic gland polyps of stomach, benign   .  Gastritis   . GERD (gastroesophageal reflux disease)   . Hypercholesteremia   . Hypertension   . Prostate cancer ([place])   . Prostatism   . Reflux esophagitis   . Renal stones   . Skin cancer    left cheek/lesion excised  . Sleep apnea   . Tachycardia    a. 11/2017 admit w/ tachycardia/? afib-->no afib noted upon review (amio/OAC d/c'd).  [name] TIA (transient ischemic attack)    Past Surgical History:  Procedure Laterality Date  . CARDIAC CATHETERIZATION    . COLONOSCOPY WITH PROPOFOL N/A 09/13/2015   Procedure: COLONOSCOPY WITH PROPOFOL;  Surgeon: Lollie Sails, MD;  Location: [facility] ENDOSCOPY;  Service: Endoscopy;  Laterality: N/A;  . COLONOSCOPY WITH PROPOFOL N/A 05/07/2019    Procedure: COLONOSCOPY WITH PROPOFOL;  Surgeon: Robert Bellow, MD;  Location: [facility] ENDOSCOPY;  Service: Endoscopy;  Laterality: N/A;  . CORONARY ANGIOPLASTY    . ESOPHAGOGASTRODUODENOSCOPY (EGD) WITH PROPOFOL N/A 09/13/2015   Procedure: ESOPHAGOGASTRODUODENOSCOPY (EGD) WITH PROPOFOL;  Surgeon: Lollie Sails, MD;  Location: [facility] ENDOSCOPY;  Service: Endoscopy;  Laterality: N/A;  . ESOPHAGOGASTRODUODENOSCOPY (EGD) WITH PROPOFOL N/A 12/28/2015   Procedure: ESOPHAGOGASTRODUODENOSCOPY (EGD) WITH PROPOFOL;  Surgeon: Lollie Sails, MD;  Location: [facility] ENDOSCOPY;  Service: Endoscopy;  Laterality: N/A;  . ESOPHAGOGASTRODUODENOSCOPY (EGD) WITH PROPOFOL N/A 06/16/2016   Procedure: ESOPHAGOGASTRODUODENOSCOPY (EGD) WITH PROPOFOL;  Surgeon: Lollie Sails, MD;  Location: [facility] ENDOSCOPY;  Service: Endoscopy;  Laterality: N/A;  . ESOPHAGOGASTRODUODENOSCOPY (EGD) WITH PROPOFOL N/A 05/07/2019   Procedure: ESOPHAGOGASTRODUODENOSCOPY (EGD) WITH PROPOFOL;  Surgeon: Robert Bellow, MD;  Location: [facility] ENDOSCOPY;  Service: Endoscopy;  Laterality: N/A;  . EYE SURGERY  2013   CATARACT EXTRACTION  . GANGLION CYST EXCISION Right 05/18/2017   Procedure: REMOVAL GANGLION CYST ANKLE;  Surgeon: Samara Deist, DPM;  Location: [facility] ORS;  Service: Podiatry;  Laterality: Right;  . heart cath stent    . LEFT HEART CATH AND CORONARY ANGIOGRAPHY Right 12/03/2017   Procedure: Left Heart Cath and Coronary Angiography with possible coronary intervention;  Surgeon: Dionisio David, MD;  Location: [facility];  Service: Cardiovascular;  Laterality: Right;  . PROSTATE BIOPSY    . stents     multiple   Family History  Problem Relation Age of Onset  . Cancer Mother        gastric and lung  . Cancer Father        multiple myeloma  . Stroke Father   . Cancer Sister        leukemia  . Cancer Brother        leukemia  . Cancer Brother        kidney  . Cancer Daughter        Uterine  . Cancer Other         [place] (Sister's Son): Prostate   Social History   Socioeconomic History  . Marital status: Married    Spouse name: Not on file  . Number of children: Not on file  . Years of education: Not on file  . Highest education level: Not on file  Occupational History  . Not on file  Tobacco Use  . Smoking status: Former Smoker    Packs/day: 1.00    Years: 35.00    Pack years: 35.00    Types: Cigarettes    Quit date: 03/06/1986    Years since quitting: 34.3  . Smokeless tobacco: Former Systems developer    Types: Secondary school teacher  .  Vaping Use: Never used  Substance and Sexual Activity  . Alcohol use: No  . Drug use: No  . Sexual activity: Not Currently  Other Topics Concern  . Not on file  Social History Narrative  . Not on file   Social Determinants of Health   Financial Resource Strain: Low Risk   . Difficulty of Paying Living Expenses: Not hard at all  Food Insecurity: No Food Insecurity  . Worried About Charity fundraiser in the Last Year: Never true  . Ran Out of Food in the Last Year: Never true  Transportation Needs: No Transportation Needs  . Lack of Transportation (Medical): No  . Lack of Transportation (Non-Medical): No  Physical Activity: Unknown  . Days of Exercise per Week: 0 days  . Minutes of Exercise per Session: Not on file  Stress: No Stress Concern Present  . Feeling of Stress : Not at all  Social Connections: Unknown  . Frequency of Communication with Friends and Family: Not on file  . Frequency of Social Gatherings with Friends and Family: Not on file  . Attends Religious Services: Not on file  . Active Member of Clubs or Organizations: Not on file  . Attends Archivist Meetings: Not on file  . Marital Status: Married    Tobacco Counseling Counseling given: Not Answered   Clinical Intake:  Pre-visit preparation completed: Yes        Diabetes: No  How often do you need to have someone help you when you read instructions, pamphlets, or  other written materials from your doctor or pharmacy?: 1 - Never    Interpreter Needed?: No      Activities of Daily Living In your present state of health, do you have any difficulty performing the following activities: 07/16/2020  Hearing? Y  Comment Hearing aids  Vision? N  Difficulty concentrating or making decisions? N  Walking or climbing stairs? N  Dressing or bathing? N  Doing errands, shopping? N  Preparing Food and eating ? N  Using the Toilet? N  In the past six months, have you accidently leaked urine? N  Do you have problems with loss of bowel control? N  Managing your Medications? N  Managing your Finances? N  Housekeeping or managing your Housekeeping? N  Some recent data might be hidden    Patient Care Team: Einar Pheasant, MD as PCP - General (Internal Medicine) [name], MD as PCP - Cardiology (Cardiology) [name], MD as Consulting Physician (Cardiology)  Indicate any recent Medical Services you may have received from other than [facility] providers in the past year (date may be approximate).     Assessment:   This is a routine wellness examination for Nathaniel Thompson.  I connected with Nathaniel Thompson today by telephone and verified that I am speaking with the correct person using two identifiers. Location patient: home Location provider: work Persons participating in the virtual visit: patient, Marine scientist.    I discussed the limitations, risks, security and privacy concerns of performing an evaluation and management service by telephone and the availability of in person appointments. The patient expressed understanding and verbally consented to this telephonic visit.    Interactive audio and video telecommunications were attempted between this provider and patient, however failed, due to patient having technical difficulties OR patient did not have access to video capability.  We continued and completed visit with audio only.  Some vital signs may be absent or  patient reported.   Hearing/Vision screen  Hearing Screening   '125Hz'$  $Remo'250Hz'SLRow$'500Hz'$'1000Hz'$'2000Hz'$'3000Hz'$'4000Hz'$'6000Hz'$'8000Hz'$   Right ear:           Left ear:           Comments: Hearing aids  Vision Screening Comments: Followed by [facility]  Wears corrective lenses Visit in the last 12 months  Dietary issues and exercise activities discussed: Current Exercise Habits: Home exercise routine, Time (Minutes): 60, Frequency (Times/Week): 3, Weekly Exercise (Minutes/Week): 180, Intensity: Mild   Healthy diet Good fluid intake  Goals Addressed            This Visit's Progress   . Follow up with pcp as needed       Routine maintenance      Depression Screen PHQ 2/9 Scores 07/16/2020 07/16/2019 07/15/2018 07/11/2017 08/07/2016 04/13/2016 11/15/2015  PHQ - 2 Score 0 0 0 0 0 0 0    Fall Risk Fall Risk  07/16/2020 07/16/2019 04/02/2019 07/11/2017 08/07/2016  Falls in the past year? 0 0 0 Yes No  Number falls in past yr: 0 - - 1 -  Injury with Fall? 0 - - No -  Comment - - - Minor bruising. Observation stay at the hospital. -  Follow up Falls evaluation completed Falls evaluation completed Falls evaluation completed Education provided -    FALL RISK PREVENTION PERTAINING TO THE HOME: Handrails in use when climbing stairs? Yes Home free of loose throw rugs in walkways, pet beds, electrical cords, etc? Yes  Adequate lighting in your home to reduce risk of falls? Yes   ASSISTIVE DEVICES UTILIZED TO PREVENT FALLS: Life alert? No  Use of a cane, walker or w/c? No   Grab bars in the bathroom? Yes  Shower chair or bench in shower? No  Elevated toilet seat or a handicapped toilet? No   TIMED UP AND GO: Was the test performed? No . Virtual visit.  Cognitive Function:  Patient is alert and oriented x3.  Denies difficulty focusing, making decisions, memory loss.  MMSE/6CIT deferred. Normal by direct communication/observation.   6CIT Screen 07/16/2019 07/15/2018 07/11/2017  What Year? 0  points 0 points 0 points  What month? 0 points 0 points 0 points  What time? 0 points 0 points 0 points  Count back from 20 - 0 points 0 points  Months in reverse 0 points 0 points 0 points  Repeat phrase - 0 points 0 points  Total Score - 0 0    Immunizations Immunization History  Administered Date(s) Administered  . Fluad Quad(high Dose 65+) 11/05/2018, 11/06/2019  . Influenza, High Dose Seasonal PF 11/28/2016  . Influenza-Unspecified 12/05/2011, 12/05/2015, 11/04/2017  . PFIZER(Purple Top)SARS-COV-2 Vaccination 03/25/2019, 04/19/2019, 05/26/2019, 12/11/2019  . Pneumococcal Conjugate-13 07/12/2016  . Pneumococcal Polysaccharide-23 03/06/2006    TDAP status: Due, Education has been provided regarding the importance of this vaccine. Advised may receive this vaccine at local pharmacy or Health Dept. Aware to provide a copy of the vaccination record if obtained from local pharmacy or Health Dept. Verbalized acceptance and understanding. Deferred.    Health Maintenance Health Maintenance  Topic Date Due  . TETANUS/TDAP  07/16/2021 (Originally 12/03/1954)  . INFLUENZA VACCINE  10/04/2020  . COVID-19 Vaccine  Completed  . PNA vac Low Risk Adult  Completed  . HPV VACCINES  Aged Out   Colorectal cancer screening: No longer required.   Lung Cancer Screening: (Low Dose CT Chest recommended if Age 71-80 years, 30 pack-year currently smoking OR have quit w/in 15years.)  does not qualify.   Hepatitis C Screening: does not qualify.  Vision Screening: Recommended annual ophthalmology exams for early detection of glaucoma and other disorders of the eye. Is the patient up to date with their annual eye exam?  Yes   Dental Screening: Recommended annual dental exams for proper oral hygiene. Partials.   Community Resource Referral / Chronic Care Management: CRR required this visit?  No   CCM required this visit?  No      Plan:     I have personally reviewed and noted the following in  the patient's chart:   . Medical and social history . Use of alcohol, tobacco or illicit drugs  . Current medications and supplements including opioid prescriptions. Patient is not currently taking opioid prescriptions. . Functional ability and status . Nutritional status . Physical activity . Advanced directives . List of other physicians . Hospitalizations, surgeries, and ER visits in previous 12 months . Vitals . Screenings to include cognitive, depression, and falls . Referrals and appointments  In addition, I have reviewed and discussed with patient certain preventive protocols, quality metrics, and best practice recommendations. A written personalized care plan for preventive services as well as general preventive health recommendations were provided to patient via mychart.     Varney Biles, LPN   08/20/735

## 2020-07-16 NOTE — Patient Instructions (Addendum)
Nathaniel Thompson , Thank you for taking time to come for your Medicare Wellness Visit. I appreciate your ongoing commitment to your health goals. Please review the following plan we discussed and let me know if I can assist you in the future.   These are the goals we discussed: Goals    . Follow up with pcp as needed     Routine maintenance       This is a list of the screening recommended for you and due dates:  Health Maintenance  Topic Date Due  . Tetanus Vaccine  07/16/2021*  . Flu Shot  10/04/2020  . COVID-19 Vaccine  Completed  . Pneumonia vaccines  Completed  . HPV Vaccine  Aged Out  *Topic was postponed. The date shown is not the original due date.   Advanced directives: End of life planning; Advance aging; Advanced directives discussed.  Copy of current HCPOA/Living Will requested.    Conditions/risks identified: none new  Follow up in one year for your annual wellness visit.   Preventive Care 90 Years and Older, Male Preventive care refers to lifestyle choices and visits with your health care provider that can promote health and wellness. What does preventive care include?  A yearly physical exam. This is also called an annual well check.  Dental exams once or twice a year.  Routine eye exams. Ask your health care provider how often you should have your eyes checked.  Personal lifestyle choices, including:  Daily care of your teeth and gums.  Regular physical activity.  Eating a healthy diet.  Avoiding tobacco and drug use.  Limiting alcohol use.  Practicing safe sex.  Taking low doses of aspirin every day.  Taking vitamin and mineral supplements as recommended by your health care provider. What happens during an annual well check? The services and screenings done by your health care provider during your annual well check will depend on your age, overall health, lifestyle risk factors, and family history of disease. Counseling  Your health care provider  may ask you questions about your:  Alcohol use.  Tobacco use.  Drug use.  Emotional well-being.  Home and relationship well-being.  Sexual activity.  Eating habits.  History of falls.  Memory and ability to understand (cognition).  Work and work Statistician. Screening  You may have the following tests or measurements:  Height, weight, and BMI.  Blood pressure.  Lipid and cholesterol levels. These may be checked every 5 years, or more frequently if you are over 39 years old.  Skin check.  Lung cancer screening. You may have this screening every year starting at age 40 if you have a 30-pack-year history of smoking and currently smoke or have quit within the past 15 years.  Fecal occult blood test (FOBT) of the stool. You may have this test every year starting at age 70.  Flexible sigmoidoscopy or colonoscopy. You may have a sigmoidoscopy every 5 years or a colonoscopy every 10 years starting at age 98.  Prostate cancer screening. Recommendations will vary depending on your family history and other risks.  Hepatitis C blood test.  Hepatitis B blood test.  Sexually transmitted disease (STD) testing.  Diabetes screening. This is done by checking your blood sugar (glucose) after you have not eaten for a while (fasting). You may have this done every 1-3 years.  Abdominal aortic aneurysm (AAA) screening. You may need this if you are a current or former smoker.  Osteoporosis. You may be screened starting at age  70 if you are at high risk. Talk with your health care provider about your test results, treatment options, and if necessary, the need for more tests. Vaccines  Your health care provider may recommend certain vaccines, such as:  Influenza vaccine. This is recommended every year.  Tetanus, diphtheria, and acellular pertussis (Tdap, Td) vaccine. You may need a Td booster every 10 years.  Zoster vaccine. You may need this after age 83.  Pneumococcal 13-valent  conjugate (PCV13) vaccine. One dose is recommended after age 43.  Pneumococcal polysaccharide (PPSV23) vaccine. One dose is recommended after age 47. Talk to your health care provider about which screenings and vaccines you need and how often you need them. This information is not intended to replace advice given to you by your health care provider. Make sure you discuss any questions you have with your health care provider. Document Released: 03/19/2015 Document Revised: 11/10/2015 Document Reviewed: 12/22/2014 Elsevier Interactive Patient Education  2017 [place] Prevention in the Home Falls can cause injuries. They can happen to people of all ages. There are many things you can do to make your home safe and to help prevent falls. What can I do on the outside of my home?  Regularly fix the edges of walkways and driveways and fix any cracks.  Remove anything that might make you trip as you walk through a door, such as a raised step or threshold.  Trim any bushes or trees on the path to your home.  Use bright outdoor lighting.  Clear any walking paths of anything that might make someone trip, such as rocks or tools.  Regularly check to see if handrails are loose or broken. Make sure that both sides of any steps have handrails.  Any raised decks and porches should have guardrails on the edges.  Have any leaves, snow, or ice cleared regularly.  Use sand or salt on walking paths during winter.  Clean up any spills in your garage right away. This includes oil or grease spills. What can I do in the bathroom?  Use night lights.  Install grab bars by the toilet and in the tub and shower. Do not use towel bars as grab bars.  Use non-skid mats or decals in the tub or shower.  If you need to sit down in the shower, use a plastic, non-slip stool.  Keep the floor dry. Clean up any water that spills on the floor as soon as it happens.  Remove soap buildup in the tub or  shower regularly.  Attach bath mats securely with double-sided non-slip rug tape.  Do not have throw rugs and other things on the floor that can make you trip. What can I do in the bedroom?  Use night lights.  Make sure that you have a light by your bed that is easy to reach.  Do not use any sheets or blankets that are too big for your bed. They should not hang down onto the floor.  Have a firm chair that has side arms. You can use this for support while you get dressed.  Do not have throw rugs and other things on the floor that can make you trip. What can I do in the kitchen?  Clean up any spills right away.  Avoid walking on wet floors.  Keep items that you use a lot in easy-to-reach places.  If you need to reach something above you, use a strong step stool that has a grab bar.  Keep  electrical cords out of the way.  Do not use floor polish or wax that makes floors slippery. If you must use wax, use non-skid floor wax.  Do not have throw rugs and other things on the floor that can make you trip. What can I do with my stairs?  Do not leave any items on the stairs.  Make sure that there are handrails on both sides of the stairs and use them. Fix handrails that are broken or loose. Make sure that handrails are as long as the stairways.  Check any carpeting to make sure that it is firmly attached to the stairs. Fix any carpet that is loose or worn.  Avoid having throw rugs at the top or bottom of the stairs. If you do have throw rugs, attach them to the floor with carpet tape.  Make sure that you have a light switch at the top of the stairs and the bottom of the stairs. If you do not have them, ask someone to add them for you. What else can I do to help prevent falls?  Wear shoes that:  Do not have high heels.  Have rubber bottoms.  Are comfortable and fit you well.  Are closed at the toe. Do not wear sandals.  If you use a stepladder:  Make sure that it is fully  opened. Do not climb a closed stepladder.  Make sure that both sides of the stepladder are locked into place.  Ask someone to hold it for you, if possible.  Clearly mark and make sure that you can see:  Any grab bars or handrails.  First and last steps.  Where the edge of each step is.  Use tools that help you move around (mobility aids) if they are needed. These include:  Canes.  Walkers.  Scooters.  Crutches.  Turn on the lights when you go into a dark area. Replace any light bulbs as soon as they burn out.  Set up your furniture so you have a clear path. Avoid moving your furniture around.  If any of your floors are uneven, fix them.  If there are any pets around you, be aware of where they are.  Review your medicines with your doctor. Some medicines can make you feel dizzy. This can increase your chance of falling. Ask your doctor what other things that you can do to help prevent falls. This information is not intended to replace advice given to you by your health care provider. Make sure you discuss any questions you have with your health care provider. Document Released: 12/17/2008 Document Revised: 07/29/2015 Document Reviewed: 03/27/2014 Elsevier Interactive Patient Education  2017 [employer].

## 2020-07-27 ENCOUNTER — Other Ambulatory Visit: Payer: Self-pay

## 2020-07-27 ENCOUNTER — Other Ambulatory Visit (INDEPENDENT_AMBULATORY_CARE_PROVIDER_SITE_OTHER): Payer: Medicare HMO

## 2020-07-27 DIAGNOSIS — N1831 Chronic kidney disease, stage 3a: Secondary | ICD-10-CM

## 2020-07-27 LAB — URINALYSIS, ROUTINE W REFLEX MICROSCOPIC
Bilirubin Urine: NEGATIVE
Hgb urine dipstick: NEGATIVE
Ketones, ur: NEGATIVE
Leukocytes,Ua: NEGATIVE
Nitrite: NEGATIVE
RBC / HPF: NONE SEEN (ref 0–?)
Specific Gravity, Urine: <=1.005 — AB (ref 1.000–1.030)
Total Protein, Urine: NEGATIVE
Urine Glucose: NEGATIVE
Urobilinogen, UA: 0.2 (ref 0.0–1.0)
pH: 6 (ref 5.0–8.0)

## 2020-07-27 LAB — BASIC METABOLIC PANEL
BUN: 17 mg/dL (ref 6–23)
CO2: 27 mEq/L (ref 19–32)
Calcium: 9.3 mg/dL (ref 8.4–10.5)
Chloride: 107 mEq/L (ref 96–112)
Creatinine, Ser: 1.3 mg/dL (ref 0.40–1.50)
GFR: 51.11 mL/min — ABNORMAL LOW (ref >60.00)
Glucose, Bld: 90 mg/dL (ref 70–99)
Potassium: 3.8 mEq/L (ref 3.5–5.1)
Sodium: 140 mEq/L (ref 135–145)

## 2020-08-05 ENCOUNTER — Encounter: Payer: Self-pay | Admitting: Internal Medicine

## 2020-08-12 ENCOUNTER — Other Ambulatory Visit: Payer: Self-pay

## 2020-08-12 ENCOUNTER — Other Ambulatory Visit: Payer: Self-pay | Admitting: Internal Medicine

## 2020-08-12 MED ORDER — CLOPIDOGREL BISULFATE 75 MG PO TABS: 75.0000 mg | ORAL_TABLET | Freq: Every day | ORAL | 0 refills | Status: DC

## 2020-08-17 ENCOUNTER — Other Ambulatory Visit: Payer: Self-pay | Admitting: *Deleted

## 2020-08-17 DIAGNOSIS — C61 Malignant neoplasm of prostate: Secondary | ICD-10-CM

## 2020-08-18 ENCOUNTER — Inpatient Hospital Stay: Payer: Medicare HMO

## 2020-08-18 ENCOUNTER — Other Ambulatory Visit: Payer: Self-pay

## 2020-08-18 ENCOUNTER — Inpatient Hospital Stay: Payer: Medicare HMO | Admitting: Internal Medicine

## 2020-08-18 ENCOUNTER — Inpatient Hospital Stay: Payer: Medicare HMO | Attending: Internal Medicine

## 2020-08-18 DIAGNOSIS — C7951 Secondary malignant neoplasm of bone: Secondary | ICD-10-CM | POA: Insufficient documentation

## 2020-08-18 DIAGNOSIS — Z5111 Encounter for antineoplastic chemotherapy: Secondary | ICD-10-CM | POA: Insufficient documentation

## 2020-08-18 DIAGNOSIS — C61 Malignant neoplasm of prostate: Secondary | ICD-10-CM | POA: Diagnosis not present

## 2020-08-18 LAB — COMPREHENSIVE METABOLIC PANEL
ALT: 14 U/L (ref 0–44)
AST: 20 U/L (ref 15–41)
Albumin: 3.9 g/dL (ref 3.5–5.0)
Alkaline Phosphatase: 53 U/L (ref 38–126)
Anion gap: 8 (ref 5–15)
BUN: 21 mg/dL (ref 8–23)
CO2: 24 mmol/L (ref 22–32)
Calcium: 9.1 mg/dL (ref 8.9–10.3)
Chloride: 106 mmol/L (ref 98–111)
Creatinine, Ser: 1.14 mg/dL (ref 0.61–1.24)
GFR, Estimated: >60 mL/min (ref >60)
Glucose, Bld: 104 mg/dL — ABNORMAL HIGH (ref 70–99)
Potassium: 4 mmol/L (ref 3.5–5.1)
Sodium: 138 mmol/L (ref 135–145)
Total Bilirubin: 0.6 mg/dL (ref 0.3–1.2)
Total Protein: 6.9 g/dL (ref 6.5–8.1)

## 2020-08-18 LAB — CBC WITH DIFFERENTIAL/PLATELET
Abs Immature Granulocytes: 0.01 10*3/uL (ref 0.00–0.07)
Basophils Absolute: 0 10*3/uL (ref 0.0–0.1)
Basophils Relative: 0 %
Eosinophils Absolute: 0.4 10*3/uL (ref 0.0–0.5)
Eosinophils Relative: 8 %
HCT: 35.9 % — ABNORMAL LOW (ref 39.0–52.0)
Hemoglobin: 12.3 g/dL — ABNORMAL LOW (ref 13.0–17.0)
Immature Granulocytes: 0 %
Lymphocytes Relative: 29 %
Lymphs Abs: 1.4 10*3/uL (ref 0.7–4.0)
MCH: 30 pg (ref 26.0–34.0)
MCHC: 34.3 g/dL (ref 30.0–36.0)
MCV: 87.6 fL (ref 80.0–100.0)
Monocytes Absolute: 0.6 10*3/uL (ref 0.1–1.0)
Monocytes Relative: 13 %
Neutro Abs: 2.4 10*3/uL (ref 1.7–7.7)
Neutrophils Relative %: 50 %
Platelets: 183 10*3/uL (ref 150–400)
RBC: 4.1 MIL/uL — ABNORMAL LOW (ref 4.22–5.81)
RDW: 12.9 % (ref 11.5–15.5)
WBC: 4.8 10*3/uL (ref 4.0–10.5)
nRBC: 0 % (ref 0.0–0.2)

## 2020-08-18 MED ORDER — LEUPROLIDE ACETATE (3 MONTH) 22.5 MG ~~LOC~~ KIT
22.5000 mg | PACK | Freq: Once | SUBCUTANEOUS | Status: AC
  Administered 2020-08-18: 22.5 mg via SUBCUTANEOUS
  Filled 2020-08-18: qty 22.5

## 2020-08-18 NOTE — Progress Notes (Signed)
[place] OFFICE PROGRESS NOTE  Patient Care Team: Einar Pheasant, MD as PCP - General (Internal Medicine) [name], MD as PCP - Cardiology (Cardiology) [name], MD as Consulting Physician (Cardiology)  Cancer Staging No matching staging information was found for the patient.   Oncology History Overview Note  Nov 2017- completed IM RT radiation therapy to his prostate and pelvic nodes for Gleason 7 (4+3) adenocarcinoma the prostate presenting the PSA of 7.8.  # JAN 2019- METASTATIC PROSTATE CA to Bone [low volume met on bone scan; CT- NED]; PSA ~50; March 25th 2019- Lupron 45 mg IM [Urology q 32M]; June 26, 2018-Eligard every 3 months[intolerance to Lupron/question A. Fib/injection site pain  # May 23rd Zytiga $RemoveBef'250mg'sePOLuvOdP$ /day; no prednisone; STOPPED MARCH 17th 2021-repeated severe hypokalemia;   # Barretts- EGD/ colo [place] 2021; Dr.Byrnett].   # [name], [place]]; Oct 2019-paroxysmal A.fib/not on eliquis; [Dr.Khan] -Dr.Arida ; Dizzy spells [Dr.Shah]  ------------------------------------------------------------------  DIAGNOSIS: [ JAN 2019]- Met- PROSTATE CANCER  STAGE:  IV       ;GOALS: PALLIATIVE  CURRENT/MOST RECENT THERAPY [ march 2019]- Lupron; May 23rd 2019- Zytiga $RemoveBefo'250mg'zsMHkryBrCA$ /day; discontinued-March 2021.     Prostate cancer [facility])     INTERVAL HISTORY:  Nathaniel Thompson 85 y.o.  male pleasant patient above history of metastatic prostate cancer on ADT is here for follow-up.  Patient notes slow improvement of his dizzy spells.  He s/p ENT evaluation/neurology evaluation.  No falls.  Denies any worsening hot flashes.  Denies any nausea vomiting abdominal pain.  Denies any worsening back pain.  Review of Systems  Constitutional:  Negative for chills, diaphoresis, fever, malaise/fatigue and weight loss.  HENT:  Negative for nosebleeds and sore throat.   Eyes:  Negative for double vision.  Respiratory:  Negative for cough, hemoptysis, sputum  production, shortness of breath and wheezing.   Cardiovascular:  Negative for chest pain, palpitations, orthopnea and leg swelling.  Gastrointestinal:  Negative for abdominal pain, blood in stool, constipation, diarrhea, heartburn, melena, nausea and vomiting.  Genitourinary:  Negative for dysuria, frequency and urgency.  Musculoskeletal:  Positive for myalgias. Negative for back pain and joint pain.  Skin: Negative.  Negative for itching and rash.  Neurological:  Positive for dizziness. Negative for tingling, focal weakness, weakness and headaches.  Endo/Heme/Allergies:  Does not bruise/bleed easily.  Psychiatric/Behavioral:  Negative for depression. The patient is not nervous/anxious and does not have insomnia.    PAST MEDICAL HISTORY :  Past Medical History:  Diagnosis Date   (HFpEF) heart failure with preserved ejection fraction ([place])    a. 05/2018 Echo: Nl EF, Gr1 DD, sev dil LA, mild MR/TR, mild PAH; b. 05/2019 Echo: EF 55-60%, no rwma, mild LVH. Nl PASP. Mildly dil LA. Triv MR. Mild AoV sclerosis w/o stenosis. Mildly dil Asc AO (18mm).   Allergic rhinitis    Barrett's esophagus 10/21/13   Bone cancer (HCC)    CKD (chronic kidney disease), stage III (HCC)    Colon polyp, hyperplastic    COPD (chronic obstructive pulmonary disease) (HCC)    mild COPD. former smoker   Coronary artery disease    a. 2003 s/p PCI ([facility]); b. 2004 s/p PCI x 2 ([facility]); c. 11/2017 Cath: LM nl, LAD mild dzs, LCX patent stent w/ 30% distal edge restenosis, RCA patent distal stent w/ 30% prox edge stenosis. Nl EF->Med Rx.   Diverticulosis    Fundic gland polyps of stomach, benign    Gastritis    GERD (gastroesophageal  reflux disease)    Hypercholesteremia    Hypertension    Prostate cancer ([place])    Prostatism    Reflux esophagitis    Renal stones    Skin cancer    left cheek/lesion excised   Sleep apnea    Tachycardia    a. 11/2017 admit w/ tachycardia/? afib-->no afib noted upon review (amio/OAC d/c'd).    TIA (transient ischemic attack)     PAST SURGICAL HISTORY :   Past Surgical History:  Procedure Laterality Date   CARDIAC CATHETERIZATION     COLONOSCOPY WITH PROPOFOL N/A 09/13/2015   Procedure: COLONOSCOPY WITH PROPOFOL;  Surgeon: Lollie Sails, MD;  Location: [facility] ENDOSCOPY;  Service: Endoscopy;  Laterality: N/A;   COLONOSCOPY WITH PROPOFOL N/A 05/07/2019   Procedure: COLONOSCOPY WITH PROPOFOL;  Surgeon: Robert Bellow, MD;  Location: [facility] ENDOSCOPY;  Service: Endoscopy;  Laterality: N/A;   CORONARY ANGIOPLASTY     ESOPHAGOGASTRODUODENOSCOPY (EGD) WITH PROPOFOL N/A 09/13/2015   Procedure: ESOPHAGOGASTRODUODENOSCOPY (EGD) WITH PROPOFOL;  Surgeon: Lollie Sails, MD;  Location: [facility] ENDOSCOPY;  Service: Endoscopy;  Laterality: N/A;   ESOPHAGOGASTRODUODENOSCOPY (EGD) WITH PROPOFOL N/A 12/28/2015   Procedure: ESOPHAGOGASTRODUODENOSCOPY (EGD) WITH PROPOFOL;  Surgeon: Lollie Sails, MD;  Location: [facility] ENDOSCOPY;  Service: Endoscopy;  Laterality: N/A;   ESOPHAGOGASTRODUODENOSCOPY (EGD) WITH PROPOFOL N/A 06/16/2016   Procedure: ESOPHAGOGASTRODUODENOSCOPY (EGD) WITH PROPOFOL;  Surgeon: Lollie Sails, MD;  Location: [facility] ENDOSCOPY;  Service: Endoscopy;  Laterality: N/A;   ESOPHAGOGASTRODUODENOSCOPY (EGD) WITH PROPOFOL N/A 05/07/2019   Procedure: ESOPHAGOGASTRODUODENOSCOPY (EGD) WITH PROPOFOL;  Surgeon: Robert Bellow, MD;  Location: [facility] ENDOSCOPY;  Service: Endoscopy;  Laterality: N/A;   EYE SURGERY  2013   CATARACT EXTRACTION   GANGLION CYST EXCISION Right 05/18/2017   Procedure: REMOVAL GANGLION CYST ANKLE;  Surgeon: Samara Deist, DPM;  Location: [facility] ORS;  Service: Podiatry;  Laterality: Right;   heart cath stent     LEFT HEART CATH AND CORONARY ANGIOGRAPHY Right 12/03/2017   Procedure: Left Heart Cath and Coronary Angiography with possible coronary intervention;  Surgeon: Dionisio David, MD;  Location: [place] CV LAB;  Service: Cardiovascular;  Laterality: Right;    PROSTATE BIOPSY     stents     multiple    FAMILY HISTORY :   Family History  Problem Relation Age of Onset   Cancer Mother        gastric and lung   Cancer Father        multiple myeloma   Stroke Father    Cancer Sister        leukemia   Cancer Brother        leukemia   Cancer Brother        kidney   Cancer Daughter        Uterine   Cancer Other        [employer] copy Son): Prostate    SOCIAL HISTORY:   Social History   Tobacco Use   Smoking status: Former    Packs/day: 1.00    Years: 35.00    Pack years: 35.00    Types: Cigarettes    Quit date: 03/06/1986    Years since quitting: 34.5   Smokeless tobacco: Former    Types: Nurse, children's Use: Never used  Substance Use Topics   Alcohol use: No   Drug use: No    ALLERGIES:  is allergic to atorvastatin.  MEDICATIONS:  Current Outpatient Medications  Medication Sig Dispense Refill  amLODipine (NORVASC) 10 MG tablet TAKE 1 TABLET BY MOUTH ONCE DAILY 90 tablet 1   aspirin EC 81 MG tablet Take 81 mg by mouth daily.     calcium citrate-vitamin D (CITRACAL+D) 315-200 MG-UNIT tablet Take 1 tablet by mouth daily.     cholecalciferol (VITAMIN D) 1000 units tablet Take 1,000 Units by mouth daily.      clopidogrel (PLAVIX) 75 MG tablet Take 1 tablet (75 mg total) by mouth daily. 90 tablet 0   ferrous sulfate 325 (65 FE) MG tablet Take by mouth.     fexofenadine (ALLEGRA) 180 MG tablet Take 180 mg by mouth daily as needed for allergies or rhinitis.      fluticasone (FLONASE) 50 MCG/ACT nasal spray Place 2 sprays into both nostrils daily.      isosorbide mononitrate (IMDUR) 30 MG 24 hr tablet TAKE 1 TABLET BY MOUTH ONCE DAILY 90 tablet 2   losartan (COZAAR) 50 MG tablet Take 1 tablet (50 mg total) by mouth daily. 90 tablet 1   lovastatin (MEVACOR) 40 MG tablet TAKE 2 TABLETS BY MOUTH AT BEDTIME 180 tablet 1   Multiple Vitamin (MULTI-VITAMINS) TABS Take 1 tablet by mouth daily.      pantoprazole (PROTONIX) 40  MG tablet Take 40 mg by mouth 2 (two) times daily.      azelastine (ASTELIN) 0.1 % nasal spray Place 1 spray into both nostrils as needed for rhinitis. Use in each nostril as directed     No current facility-administered medications for this visit.    PHYSICAL EXAMINATION: ECOG PERFORMANCE STATUS: 0 - Asymptomatic  BP 117/72   Pulse 69   Temp (!) 97.2 F (36.2 C) (Oral)   Resp 20   Ht $R'5\' 10"'ys$  (1.778 m)   Wt 219 lb (99.3 kg)   BMI 31.42 kg/m   Filed Weights   08/18/20 1309  Weight: 219 lb (99.3 kg)    Physical Exam Constitutional:      Comments: Patient is with his wife.  He is walking independently.  HENT:     Head: Normocephalic and atraumatic.     Mouth/Throat:     Pharynx: No oropharyngeal exudate.  Eyes:     Pupils: Pupils are equal, round, and reactive to light.  Cardiovascular:     Rate and Rhythm: Normal rate and regular rhythm.  Pulmonary:     Effort: No respiratory distress.     Breath sounds: No wheezing.  Abdominal:     General: Bowel sounds are normal. There is no distension.     Palpations: Abdomen is soft. There is no mass.     Tenderness: There is no abdominal tenderness. There is no guarding or rebound.  Musculoskeletal:        General: No tenderness. Normal range of motion.     Cervical back: Normal range of motion and neck supple.  Skin:    General: Skin is warm.  Neurological:     Mental Status: He is alert and oriented to person, place, and time.  Psychiatric:        Mood and Affect: Affect normal.    LABORATORY DATA:  I have reviewed the data as listed    Component Value Date/Time   NA 138 08/18/2020 1248   NA 140 09/16/2013 0438   K 4.0 08/18/2020 1248   K 4.2 09/16/2013 0438   CL 106 08/18/2020 1248   CL 109 (H) 09/16/2013 0438   CO2 24 08/18/2020 1248   CO2 24 09/16/2013 0438  GLUCOSE 104 (H) 08/18/2020 1248   GLUCOSE 101 (H) 09/16/2013 0438   BUN 21 08/18/2020 1248   BUN 20 (H) 09/16/2013 0438   CREATININE 1.14 08/18/2020  1248   CREATININE 1.28 09/16/2013 0438   CALCIUM 9.1 08/18/2020 1248   CALCIUM 8.3 (L) 09/16/2013 0438   PROT 6.9 08/18/2020 1248   PROT 6.7 09/15/2013 0755   ALBUMIN 3.9 08/18/2020 1248   ALBUMIN 3.5 09/15/2013 0755   AST 20 08/18/2020 1248   AST 37 09/15/2013 0755   ALT 14 08/18/2020 1248   ALT 28 09/15/2013 0755   ALKPHOS 53 08/18/2020 1248   ALKPHOS 44 (L) 09/15/2013 0755   BILITOT 0.6 08/18/2020 1248   BILITOT 0.5 09/15/2013 0755   GFRNONAA >60 08/18/2020 1248   GFRNONAA 54 (L) 09/16/2013 0438   GFRAA 59 (L) 10/07/2019 1305   GFRAA >60 09/16/2013 0438    No results found for: SPEP, UPEP  Lab Results  Component Value Date   WBC 4.8 08/18/2020   NEUTROABS 2.4 08/18/2020   HGB 12.3 (L) 08/18/2020   HCT 35.9 (L) 08/18/2020   MCV 87.6 08/18/2020   PLT 183 08/18/2020      Chemistry      Component Value Date/Time   NA 138 08/18/2020 1248   NA 140 09/16/2013 0438   K 4.0 08/18/2020 1248   K 4.2 09/16/2013 0438   CL 106 08/18/2020 1248   CL 109 (H) 09/16/2013 0438   CO2 24 08/18/2020 1248   CO2 24 09/16/2013 0438   BUN 21 08/18/2020 1248   BUN 20 (H) 09/16/2013 0438   CREATININE 1.14 08/18/2020 1248   CREATININE 1.28 09/16/2013 0438      Component Value Date/Time   CALCIUM 9.1 08/18/2020 1248   CALCIUM 8.3 (L) 09/16/2013 0438   ALKPHOS 53 08/18/2020 1248   ALKPHOS 44 (L) 09/15/2013 0755   AST 20 08/18/2020 1248   AST 37 09/15/2013 0755   ALT 14 08/18/2020 1248   ALT 28 09/15/2013 0755   BILITOT 0.6 08/18/2020 1248   BILITOT 0.5 09/15/2013 0755       RADIOGRAPHIC STUDIES: I have personally reviewed the radiological images as listed and agreed with the findings in the report. No results found.   ASSESSMENT & PLAN:  Prostate cancer ([place]) # Castrate sensitive-metastatic prostate cancer to the bone. Stage IV-on Eligard; Bone scan October 2020-improved;  FEB 2022- PSA- <0.1.  Eligard today. [22.69m q3M]  # Iron deficient anemia/CKD- III-[s/p EGD March  2021]--status post IV iron infusion x1 [FEB 2022]-today hemoglobin is 12.3. STABLE; Hold off on infusion.  # Dizzy spells- ? Seizures [s/p EEG; Dr.Shah]; MRI brain-no evidence of stroke. S/p ENT evaluation.   # A. fib paroxysmal-sinus rhythm; aspirin Plavix-stable.  # DISPOSITION:  # Eligard today; HOLD  Venofer # Follow up in 3 months; MD;labs- cbc.cmp/PSA eligard; possible venofer- Dr.B  Cc; Dr.Scott/   Orders Placed This Encounter  Procedures   CBC with Differential    Standing Status:   Future    Standing Expiration Date:   08/18/2021   Comprehensive metabolic panel    Standing Status:   Future    Standing Expiration Date:   08/18/2021   PSA    Standing Status:   Future    Standing Expiration Date:   08/18/2021   All questions were answered. The patient knows to call the clinic with any problems, questions or concerns.      GCammie Sickle MD 08/28/2020 9:51 PM

## 2020-08-18 NOTE — Assessment & Plan Note (Addendum)
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 Stage IV-on Eligard; Bone scan October 2020-improved;  FEB 2022- PSA- <0.1.  Eligard today. [22.5mg  q3M]  # Iron deficient anemia/CKD- III-[s/p EGD March 2021]--status post IV iron infusion x1 [FEB 2022]-today hemoglobin is 12.3. STABLE; Hold off on infusion.  # Dizzy spells- ? Seizures [s/p EEG; Dr.Shah]; MRI brain-no evidence of stroke. S/p ENT evaluation.   # A. fib paroxysmal-sinus rhythm; aspirin Plavix-stable.  # DISPOSITION:  # Eligard today; HOLD  Venofer # Follow up in 3 months; MD;labs- cbc.cmp/PSA eligard; possible venofer- Dr.B  Cc; Dr.Scott/

## 2020-08-19 ENCOUNTER — Encounter: Payer: Self-pay | Admitting: Cardiovascular Disease

## 2020-08-19 ENCOUNTER — Ambulatory Visit: Payer: Medicare HMO | Admitting: Cardiovascular Disease

## 2020-08-19 VITALS — BP 118/70 | HR 71 | Ht 70.0 in | Wt 218.5 lb

## 2020-08-19 DIAGNOSIS — I5032 Chronic diastolic (congestive) heart failure: Secondary | ICD-10-CM

## 2020-08-19 DIAGNOSIS — I2510 Atherosclerotic heart disease of native coronary artery without angina pectoris: Secondary | ICD-10-CM

## 2020-08-19 DIAGNOSIS — I714 Abdominal aortic aneurysm, without rupture, unspecified: Secondary | ICD-10-CM

## 2020-08-19 DIAGNOSIS — I10 Essential (primary) hypertension: Secondary | ICD-10-CM | POA: Diagnosis not present

## 2020-08-19 DIAGNOSIS — E785 Hyperlipidemia, unspecified: Secondary | ICD-10-CM

## 2020-08-19 LAB — PSA: Prostatic Specific Antigen: <0.01 ng/mL (ref 0.00–4.00)

## 2020-08-19 NOTE — Progress Notes (Signed)
Cardiology Office Note   Date:  08/19/2020   ID:  Nathaniel Thompson, DOB 1935-07-28, MRN [id number]  PCP:  Nathaniel Pheasant, MD  Cardiologist:   Nathaniel Sacramento, MD   Chief Complaint  Patient presents with   Other    6 month f/u no complaints today. Meds reviewed verbally with pt.       History of Present Illness: Nathaniel Thompson is a 85 y.o. male who presents for a follow-up visit regarding coronary artery disease.   He has known history of coronary artery disease status post stenting of the left circumflex and RCA.  He had initial PCI done in 2003 at [facility].  He had repeat PCI x2 at [facility] in 2004.  Most recent cardiac catheterization in 2019 was personally reviewed by me and showed patent left circumflex stent with 30% distal edge restenosis, mild LAD disease, patent distal RCA stent with 30% proximal stenosis.  Ejection fraction was normal. He quit smoking in the 80s.  He reports having TIA in the past.  Other medical problems include metastatic prostate cancer, small abdominal aortic aneurysm followed by vascular surgery, hyperlipidemia and essential hypertension. He was hospitalized in September 2019 with questionable tachycardia and atrial fibrillation.  Initially was placed on amiodarone and anticoagulation but all his EKGs revealed sinus rhythm at that time and thus both amiodarone and Eliquis were discontinued.  No recurrent arrhythmia since then.    Echocardiogram in March 2020 showed normal LV systolic function with grade 1 diastolic dysfunction, severely dilated left atrium, mild mitral and tricuspid regurgitation and mild pulmonary hypertension. He has been doing well with no chest pain, shortness of breath or palpitations.  He continues to get Lupron injections and his PSA is very low.  He takes his medications regularly.    Past Medical History:  Diagnosis Date   (HFpEF) heart failure with preserved ejection fraction ([place])    a. 05/2018 Echo: Nl EF, Gr1 DD, sev dil LA,  mild MR/TR, mild PAH; b. 05/2019 Echo: EF 55-60%, no rwma, mild LVH. Nl PASP. Mildly dil LA. Triv MR. Mild AoV sclerosis w/o stenosis. Mildly dil Asc AO (86mm).   Allergic rhinitis    Barrett's esophagus 10/21/13   Bone cancer (HCC)    CKD (chronic kidney disease), stage III (HCC)    Colon polyp, hyperplastic    COPD (chronic obstructive pulmonary disease) (HCC)    mild COPD. former smoker   Coronary artery disease    a. 2003 s/p PCI ([facility]); b. 2004 s/p PCI x 2 ([facility]); c. 11/2017 Cath: LM nl, LAD mild dzs, LCX patent stent w/ 30% distal edge restenosis, RCA patent distal stent w/ 30% prox edge stenosis. Nl EF->Med Rx.   Diverticulosis    Fundic gland polyps of stomach, benign    Gastritis    GERD (gastroesophageal reflux disease)    Hypercholesteremia    Hypertension    Prostate cancer ([place])    Prostatism    Reflux esophagitis    Renal stones    Skin cancer    left cheek/lesion excised   Sleep apnea    Tachycardia    a. 11/2017 admit w/ tachycardia/? afib-->no afib noted upon review (amio/OAC d/c'd).   TIA (transient ischemic attack)     Past Surgical History:  Procedure Laterality Date   CARDIAC CATHETERIZATION     COLONOSCOPY WITH PROPOFOL N/A 09/13/2015   Procedure: COLONOSCOPY WITH PROPOFOL;  Surgeon: Lollie Sails, MD;  Location: [facility] ENDOSCOPY;  Service: Endoscopy;  Laterality: N/A;   COLONOSCOPY WITH PROPOFOL N/A 05/07/2019   Procedure: COLONOSCOPY WITH PROPOFOL;  Surgeon: Robert Bellow, MD;  Location: [facility] ENDOSCOPY;  Service: Endoscopy;  Laterality: N/A;   CORONARY ANGIOPLASTY     ESOPHAGOGASTRODUODENOSCOPY (EGD) WITH PROPOFOL N/A 09/13/2015   Procedure: ESOPHAGOGASTRODUODENOSCOPY (EGD) WITH PROPOFOL;  Surgeon: Lollie Sails, MD;  Location: [facility] ENDOSCOPY;  Service: Endoscopy;  Laterality: N/A;   ESOPHAGOGASTRODUODENOSCOPY (EGD) WITH PROPOFOL N/A 12/28/2015   Procedure: ESOPHAGOGASTRODUODENOSCOPY (EGD) WITH PROPOFOL;  Surgeon: Lollie Sails, MD;  Location:  [facility] ENDOSCOPY;  Service: Endoscopy;  Laterality: N/A;   ESOPHAGOGASTRODUODENOSCOPY (EGD) WITH PROPOFOL N/A 06/16/2016   Procedure: ESOPHAGOGASTRODUODENOSCOPY (EGD) WITH PROPOFOL;  Surgeon: Lollie Sails, MD;  Location: [facility] ENDOSCOPY;  Service: Endoscopy;  Laterality: N/A;   ESOPHAGOGASTRODUODENOSCOPY (EGD) WITH PROPOFOL N/A 05/07/2019   Procedure: ESOPHAGOGASTRODUODENOSCOPY (EGD) WITH PROPOFOL;  Surgeon: Robert Bellow, MD;  Location: [facility] ENDOSCOPY;  Service: Endoscopy;  Laterality: N/A;   EYE SURGERY  2013   CATARACT EXTRACTION   GANGLION CYST EXCISION Right 05/18/2017   Procedure: REMOVAL GANGLION CYST ANKLE;  Surgeon: Samara Deist, DPM;  Location: [facility] ORS;  Service: Podiatry;  Laterality: Right;   heart cath stent     LEFT HEART CATH AND CORONARY ANGIOGRAPHY Right 12/03/2017   Procedure: Left Heart Cath and Coronary Angiography with possible coronary intervention;  Surgeon: Dionisio David, MD;  Location: [place] CV LAB;  Service: Cardiovascular;  Laterality: Right;   PROSTATE BIOPSY     stents     multiple     Current Outpatient Medications  Medication Sig Dispense Refill   amLODipine (NORVASC) 10 MG tablet TAKE 1 TABLET BY MOUTH ONCE DAILY 90 tablet 1   aspirin EC 81 MG tablet Take 81 mg by mouth daily.     azelastine (ASTELIN) 0.1 % nasal spray Place 1 spray into both nostrils as needed for rhinitis. Use in each nostril as directed     calcium citrate-vitamin D (CITRACAL+D) 315-200 MG-UNIT tablet Take 1 tablet by mouth daily.     cholecalciferol (VITAMIN D) 1000 units tablet Take 1,000 Units by mouth daily.      clopidogrel (PLAVIX) 75 MG tablet Take 1 tablet (75 mg total) by mouth daily. 90 tablet 0   ferrous sulfate 325 (65 FE) MG tablet Take by mouth.     fexofenadine (ALLEGRA) 180 MG tablet Take 180 mg by mouth daily as needed for allergies or rhinitis.      fluticasone (FLONASE) 50 MCG/ACT nasal spray Place 2 sprays into both nostrils daily.      isosorbide  mononitrate (IMDUR) 30 MG 24 hr tablet TAKE 1 TABLET BY MOUTH ONCE DAILY 90 tablet 2   losartan (COZAAR) 50 MG tablet Take 1 tablet (50 mg total) by mouth daily. 90 tablet 1   lovastatin (MEVACOR) 40 MG tablet TAKE 2 TABLETS BY MOUTH AT BEDTIME 180 tablet 1   Multiple Vitamin (MULTI-VITAMINS) TABS Take 1 tablet by mouth daily.      pantoprazole (PROTONIX) 40 MG tablet Take 40 mg by mouth 2 (two) times daily.      No current facility-administered medications for this visit.    Allergies:   Atorvastatin    Social History:  The patient  reports that he quit smoking about 34 years ago. His smoking use included cigarettes. He has a 35.00 pack-year smoking history. He has quit using smokeless tobacco.  His smokeless tobacco use included chew. He reports that he does not drink alcohol and does  not use drugs.   Family History:  The patient's family history includes Cancer in his brother, brother, daughter, father, mother, sister, and another family member; Stroke in his father.    ROS:  Please see the history of present illness.   Otherwise, review of systems are positive for none.   All other systems are reviewed and negative.    PHYSICAL EXAM: VS:  BP 118/70 (BP Location: Left Arm, Patient Position: Sitting, Cuff Size: Normal)   Pulse 71   Ht 5\' 10"  (1.778 m)   Wt 218 lb 8 oz (99.1 kg)   SpO2 98%   BMI 31.35 kg/m  , BMI Body mass index is 31.35 kg/m. GEN: Well nourished, well developed, in no acute distress  HEENT: normal  Neck: no JVD, carotid bruits, or masses Cardiac: RRR; no murmurs, rubs, or gallops,no edema  Respiratory:  clear to auscultation bilaterally, normal work of breathing GI: soft, nontender, nondistended, + BS MS: no deformity or atrophy  Skin: warm and dry, no rash Neuro:  Strength and sensation are intact Psych: euthymic mood, full affect   EKG:  EKG is ordered today. The ekg ordered today demonstrates normal sinus rhythm with no significant ST or T wave  changes.   Recent Labs: 06/17/2020: TSH 2.15 08/18/2020: ALT 14; BUN 21; Creatinine, Ser 1.14; Hemoglobin 12.3; Platelets 183; Potassium 4.0; Sodium 138    Lipid Panel    Component Value Date/Time   CHOL 121 06/17/2020 0822   CHOL 103 09/16/2013 0438   TRIG 98.0 06/17/2020 0822   TRIG 95 09/16/2013 0438   HDL 33.40 (L) 06/17/2020 0822   HDL 30 (L) 09/16/2013 0438   CHOLHDL 4 06/17/2020 0822   VLDL 19.6 06/17/2020 0822   VLDL 19 09/16/2013 0438   LDLCALC 68 06/17/2020 0822   LDLCALC 54 09/16/2013 0438      Wt Readings from Last 3 Encounters:  08/19/20 218 lb 8 oz (99.1 kg)  08/18/20 219 lb (99.3 kg)  07/16/20 215 lb (97.5 kg)       PAD Screen 04/15/2019  Previous PAD dx? No  Previous surgical procedure? No  Pain with walking? No  Feet/toe relief with dangling? No  Painful, non-healing ulcers? No  Extremities discolored? No      ASSESSMENT AND PLAN:  1.  Coronary artery disease involving native coronary arteries without angina: He is overall doing reasonably well with no anginal symptoms.  He continues to be on dual antiplatelet therapy as tolerated given that he likely had first-generation drug-eluting stents.  2.  Chronic diastolic heart failure: He appears to be euvolemic and blood pressure is controlled.  He is not requiring any diuretic.  3.  Essential hypertension: Blood pressure is controlled on current medications.    4.  Hyperlipidemia: Currently on lovastatin and Zetia.  History of intolerance to potent statins.  I reviewed most recent lipid profile showed an LDL of 68.  5.  Small abdominal aortic aneurysm: Less than 4 cm.  Followed by vascular surgery.  This has been stable.  In addition, most recent carotid Doppler showed mild nonobstructive disease.   Disposition:   FU with me in 12 months  Signed,  Nathaniel Sacramento, MD  08/19/2020 9:58 AM    [place]

## 2020-08-19 NOTE — Patient Instructions (Signed)
Medication Instructions:  Please continue your current medications  *If you need a refill on your cardiac medications before your next appointment, please call your pharmacy*   Lab Work: None  Testing/Procedures: None   Follow-Up: At [facility], you and your health needs are our priority.  As part of our continuing mission to provide you with exceptional heart care, we have created designated Provider Care Teams.  These Care Teams include your primary Cardiologist (physician) and Advanced Practice Providers (APPs -  Physician Assistants and Nurse Practitioners) who all work together to provide you with the care you need, when you need it.  Your next appointment:   1 year(s)  The format for your next appointment:   In Person  Provider:   You may see Kathlyn Sacramento, MD or one of the following Advanced Practice Providers on your designated Care Team:   Murray Hodgkins, NP Christell Faith, PA-C Marrianne Mood, PA-C Cadence [place], [place]

## 2020-08-28 ENCOUNTER — Encounter: Payer: Self-pay | Admitting: Internal Medicine

## 2020-09-21 ENCOUNTER — Ambulatory Visit: Payer: Medicare HMO | Admitting: Internal Medicine

## 2020-09-24 ENCOUNTER — Telehealth: Payer: Self-pay | Admitting: Internal Medicine

## 2020-09-24 NOTE — Telephone Encounter (Signed)
I called back & spoke with patient's wife she agreed he should be seen ASAP to see if medication needed to be prescribed. She is taking patient to [facility] UC either this evening or first thing in the morning. She wanted me to thank both of you for the wonderful care. She will either mychart over the weekend or call Monday to update [place] on how patient is doing.

## 2020-09-24 NOTE — Telephone Encounter (Signed)
I would suggest an urgent care visit for evaluation as he likely meets criteria for oral treatment for COVID given his age.

## 2020-09-24 NOTE — Telephone Encounter (Signed)
Pt tested positive for Covid today (09/24/20) Pt is having a cough and low grade fever.  Pt wife just wanted to let Dr. Nicki Reaper know

## 2020-09-24 NOTE — Telephone Encounter (Signed)
I called and spoke with patient's wife. I could hear patient in the background. It is patient's wife 6th day & she is feeling good. Patient started feeling bad today & was resting a lot. 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 He has scratchy throat, cough & fever of 100. Pt is taking tylenol & has some Delsym for cough. He is NOT SOB nor does he have any breathing issues or chest congestion. Just nasal congestion at this time. Please advise on any further treatment if needed or should advise UC. Pt's wife worried.

## 2020-09-26 NOTE — Telephone Encounter (Signed)
I had already left Friday when call came through.  He did go to acute care and it appears was prescribed oral antiviral.  Please confirm he is doing ok.

## 2020-09-27 NOTE — Telephone Encounter (Signed)
FYI   Patient confirmed doing ok. He says he is having some sinus symptoms but denies any fever, chills, sob, chest tightness. Patient is going to continue the antiviral medication and let us know if he needs anything. He and his wife thanked [place] for checking in on them as they go through this

## 2020-10-11 ENCOUNTER — Encounter: Payer: Self-pay | Admitting: Internal Medicine

## 2020-10-11 ENCOUNTER — Ambulatory Visit: Payer: Medicare HMO | Admitting: Internal Medicine

## 2020-10-11 ENCOUNTER — Other Ambulatory Visit: Payer: Self-pay

## 2020-10-11 DIAGNOSIS — I724 Aneurysm of artery of lower extremity: Secondary | ICD-10-CM

## 2020-10-11 DIAGNOSIS — K219 Gastro-esophageal reflux disease without esophagitis: Secondary | ICD-10-CM

## 2020-10-11 DIAGNOSIS — I10 Essential (primary) hypertension: Secondary | ICD-10-CM

## 2020-10-11 DIAGNOSIS — I5032 Chronic diastolic (congestive) heart failure: Secondary | ICD-10-CM | POA: Diagnosis not present

## 2020-10-11 DIAGNOSIS — D500 Iron deficiency anemia secondary to blood loss (chronic): Secondary | ICD-10-CM

## 2020-10-11 DIAGNOSIS — I714 Abdominal aortic aneurysm, without rupture, unspecified: Secondary | ICD-10-CM

## 2020-10-11 DIAGNOSIS — E7800 Pure hypercholesterolemia, unspecified: Secondary | ICD-10-CM

## 2020-10-11 DIAGNOSIS — I779 Disorder of arteries and arterioles, unspecified: Secondary | ICD-10-CM

## 2020-10-11 DIAGNOSIS — G4730 Sleep apnea, unspecified: Secondary | ICD-10-CM

## 2020-10-11 DIAGNOSIS — R739 Hyperglycemia, unspecified: Secondary | ICD-10-CM

## 2020-10-11 DIAGNOSIS — J449 Chronic obstructive pulmonary disease, unspecified: Secondary | ICD-10-CM

## 2020-10-11 NOTE — Progress Notes (Signed)
Patient ID: Nathaniel Thompson, male   DOB: [birth year], 85 y.o.   MRN: [id number]   Subjective:    Patient ID: Nathaniel Thompson, male    DOB: 1935-11-28, 85 y.o.   MRN: [id number]  HPI This visit occurred during the SARS-CoV-2 public health emergency.  Safety protocols were in place, including screening questions prior to the visit, additional usage of staff PPE, and extensive cleaning of exam room while observing appropriate contact time as indicated for disinfecting solutions.   Patient here for a scheduled follow up.  He is accompanied by his wife.  History obtained from both of them.  He was seen acute care 09/24/20 - with covid.  Symptoms had started two days before UC appt.  Treated with molnupiravir.  He is doing better.  Feels better.  No chest pain or sob reported.  Saw cardiology [id number] - stable.  Continues dual antiplatelet therapy.  Stable.  Recommended f/u in 12 months.  Saw Dr Rogue Bussing 08/18/20 - hgb 12.3.  on eligard - for prostate cancer.  Stable.  Recommended f/u in 3 months.  Taking PPI bid.  TUMS occasionally.  Eating.  No nausea or vomiting reported.  No abdominal pain.  Bowels moving.  Using cpap.    Past Medical History:  Diagnosis Date   (HFpEF) heart failure with preserved ejection fraction ([place])    a. 05/2018 Echo: Nl EF, Gr1 DD, sev dil LA, mild MR/TR, mild PAH; b. 05/2019 Echo: EF 55-60%, no rwma, mild LVH. Nl PASP. Mildly dil LA. Triv MR. Mild AoV sclerosis w/o stenosis. Mildly dil Asc AO (40m).   Allergic rhinitis    Barrett's esophagus 10/21/13   Bone cancer (HCC)    CKD (chronic kidney disease), stage III (HCC)    Colon polyp, hyperplastic    COPD (chronic obstructive pulmonary disease) (HCC)    mild COPD. former smoker   Coronary artery disease    a. 2003 s/p PCI ([facility]); b. 2004 s/p PCI x 2 ([facility]); c. 11/2017 Cath: LM nl, LAD mild dzs, LCX patent stent w/ 30% distal edge restenosis, RCA patent distal stent w/ 30% prox edge stenosis. Nl EF->Med Rx.   Diverticulosis     Fundic gland polyps of stomach, benign    Gastritis    GERD (gastroesophageal reflux disease)    Hypercholesteremia    Hypertension    [place]state cancer [place]    Prostatism    Reflux esophagitis    Renal stones    Skin cancer    left cheek/lesion excised   Sleep apnea    Tachycardia    a. 11/2017 admit w/ tachycardia/? afib-->no afib noted upon review (amio/OAC d/c'd).   TIA (transient ischemic attack)    Past Surgical History:  Procedure Laterality Date   CARDIAC CATHETERIZATION     COLONOSCOPY WITH PROPOFOL N/A 09/13/2015   Procedure: COLONOSCOPY WITH PROPOFOL;  Surgeon: MLollie Sails MD;  [facility];  Service: Endoscopy;  Laterality: N/A;   COLONOSCOPY WITH PROPOFOL N/A 05/07/2019   Procedure: COLONOSCOPY WITH PROPOFOL;  Surgeon: BRobert Bellow MD;  Location: [facility] ENDOSCOPY;  Service: Endoscopy;  Laterality: N/A;   CORONARY ANGIOPLASTY     ESOPHAGOGASTRODUODENOSCOPY (EGD) WITH PROPOFOL N/A 09/13/2015   Procedure: ESOPHAGOGASTRODUODENOSCOPY (EGD) WITH PROPOFOL;  Surgeon: MLollie Sails MD;  [facility];  Service: Endoscopy;  Laterality: N/A;   ESOPHAGOGASTRODUODENOSCOPY (EGD) WITH PROPOFOL N/A 12/28/2015   Procedure: ESOPHAGOGASTRODUODENOSCOPY (EGD) WITH PROPOFOL;  Surgeon: MLollie Sails MD;  [facility];  Service: Endoscopy;  Laterality: N/A;   ESOPHAGOGASTRODUODENOSCOPY (EGD) WITH PROPOFOL N/A 06/16/2016   Procedure: ESOPHAGOGASTRODUODENOSCOPY (EGD) WITH PROPOFOL;  Surgeon: Lollie Sails, MD;  Location: [facility] ENDOSCOPY;  Service: Endoscopy;  Laterality: N/A;   ESOPHAGOGASTRODUODENOSCOPY (EGD) WITH PROPOFOL N/A 05/07/2019   Procedure: ESOPHAGOGASTRODUODENOSCOPY (EGD) WITH PROPOFOL;  Surgeon: Robert Bellow, MD;  Location: [facility] ENDOSCOPY;  Service: Endoscopy;  Laterality: N/A;   EYE SURGERY  2013   CATARACT EXTRACTION   GANGLION CYST EXCISION Right 05/18/2017   Procedure: REMOVAL GANGLION CYST ANKLE;  Surgeon: Samara Deist,  DPM;  Location: [facility] ORS;  Service: Podiatry;  Laterality: Right;   heart cath stent     LEFT HEART CATH AND CORONARY ANGIOGRAPHY Right 12/03/2017   Procedure: Left Heart Cath and Coronary Angiography with possible coronary intervention;  Surgeon: Dionisio David, MD;  Location: [place] CV LAB;  Service: Cardiovascular;  Laterality: Right;   PROSTATE BIOPSY     stents     multiple   Family History  Problem Relation Age of Onset   Cancer Mother        gastric and lung   Cancer Father        multiple myeloma   Stroke Father    Cancer Sister        leukemia   Cancer Brother        leukemia   Cancer Brother        kidney   Cancer Daughter        Uterine   Cancer Other        [place] (Sister's Son): Prostate   Social History   Socioeconomic History   Marital status: Married    Spouse name: Not on file   Number of children: Not on file   Years of education: Not on file   Highest education level: Not on file  Occupational History   Not on file  Tobacco Use   Smoking status: Former    Packs/day: 1.00    Years: 35.00    Pack years: 35.00    Types: Cigarettes    Quit date: 03/06/1986    Years since quitting: 34.6   Smokeless tobacco: Former    Types: Nurse, children's Use: Never used  Substance and Sexual Activity   Alcohol use: No   Drug use: No   Sexual activity: Not Currently  Other Topics Concern   Not on file  Social History Narrative   Not on file   Social Determinants of Health   Financial Resource Strain: Low Risk    Difficulty of Paying Living Expenses: Not hard at all  Food Insecurity: No Food Insecurity   Worried About Charity fundraiser in the Last Year: Never true   [place] in the Last Year: Never true  Transportation Needs: No Transportation Needs   Lack of Transportation (Medical): No   Lack of Transportation (Non-Medical): No  Physical Activity: Unknown   Days of Exercise per Week: 0 days   Minutes of Exercise per Session:  Not on file  Stress: No Stress Concern Present   Feeling of Stress : Not at all  Social Connections: Unknown   Frequency of Communication with Friends and Family: Not on file   Frequency of Social Gatherings with Friends and Family: Not on file   Attends Religious Services: Not on file   Active Member of Clubs or Organizations: Not on file   Attends Archivist Meetings: Not on file  Marital Status: Married    Review of Systems  Constitutional:  Negative for appetite change and unexpected weight change.  HENT:  Negative for congestion and sinus pressure.   Respiratory:  Negative for cough and chest tightness.        Breathing stable.   Cardiovascular:  Negative for chest pain, palpitations and leg swelling.  Gastrointestinal:  Negative for abdominal pain, diarrhea, nausea and vomiting.  Genitourinary:  Negative for difficulty urinating and dysuria.  Musculoskeletal:  Negative for joint swelling and myalgias.  Skin:  Negative for color change and rash.  Neurological:  Negative for dizziness, light-headedness and headaches.  Psychiatric/Behavioral:  Negative for agitation and dysphoric mood.       Objective:    Physical Exam Constitutional:      General: He is not in acute distress.    Appearance: Normal appearance. He is well-developed.  HENT:     Head: Normocephalic and atraumatic.     Right Ear: External ear normal.     Left Ear: External ear normal.  Eyes:     General: No scleral icterus.       Right eye: No discharge.        Left eye: No discharge.  Cardiovascular:     Rate and Rhythm: Normal rate and regular rhythm.  Pulmonary:     Effort: Pulmonary effort is normal. No respiratory distress.     Breath sounds: Normal breath sounds.  Abdominal:     General: Bowel sounds are normal.     Palpations: Abdomen is soft.     Tenderness: There is no abdominal tenderness.  Musculoskeletal:        General: No swelling or tenderness.     Cervical back: Neck  supple. No tenderness.  Lymphadenopathy:     Cervical: No cervical adenopathy.  Skin:    Findings: No erythema or rash.  Neurological:     Mental Status: He is alert.  Psychiatric:        Mood and Affect: Mood normal.        Behavior: Behavior normal.    BP (!) 116/58   Pulse 69   Ht '5\' 10"'  (1.778 m)   Wt 223 lb 3.2 oz (101.2 kg)   SpO2 96%   BMI 32.03 kg/m  Wt Readings from Last 3 Encounters:  10/11/20 223 lb 3.2 oz (101.2 kg)  08/19/20 218 lb 8 oz (99.1 kg)  08/18/20 219 lb (99.3 kg)    Outpatient Encounter Medications as of 10/11/2020  Medication Sig   amLODipine (NORVASC) 10 MG tablet TAKE 1 TABLET BY MOUTH ONCE DAILY   aspirin EC 81 MG tablet Take 81 mg by mouth daily.   azelastine (ASTELIN) 0.1 % nasal spray Place 1 spray into both nostrils as needed for rhinitis. Use in each nostril as directed   calcium citrate-vitamin D (CITRACAL+D) 315-200 MG-UNIT tablet Take 1 tablet by mouth daily.   cholecalciferol (VITAMIN D) 1000 units tablet Take 1,000 Units by mouth daily.    clopidogrel (PLAVIX) 75 MG tablet Take 1 tablet (75 mg total) by mouth daily.   ferrous sulfate 325 (65 FE) MG tablet Take by mouth.   fexofenadine (ALLEGRA) 180 MG tablet Take 180 mg by mouth daily as needed for allergies or rhinitis.    fluticasone (FLONASE) 50 MCG/ACT nasal spray Place 2 sprays into both nostrils daily.    losartan (COZAAR) 50 MG tablet Take 1 tablet (50 mg total) by mouth daily.   lovastatin (MEVACOR) 40 MG tablet TAKE  2 TABLETS BY MOUTH AT BEDTIME   Multiple Vitamin (MULTI-VITAMINS) TABS Take 1 tablet by mouth daily.    pantoprazole (PROTONIX) 40 MG tablet Take 40 mg by mouth 2 (two) times daily.    [DISCONTINUED] isosorbide mononitrate (IMDUR) 30 MG 24 hr tablet TAKE 1 TABLET BY MOUTH ONCE DAILY   No facility-administered encounter medications on file as of 10/11/2020.     Lab Results  Component Value Date   WBC 4.8 08/18/2020   HGB 12.3 (L) 08/18/2020   HCT 35.9 (L) 08/18/2020    PLT 183 08/18/2020   GLUCOSE 104 (H) 08/18/2020   CHOL 121 06/17/2020   TRIG 98.0 06/17/2020   HDL 33.40 (L) 06/17/2020   LDLCALC 68 06/17/2020   ALT 14 08/18/2020   AST 20 08/18/2020   NA 138 08/18/2020   K 4.0 08/18/2020   CL 106 08/18/2020   CREATININE 1.14 08/18/2020   BUN 21 08/18/2020   CO2 24 08/18/2020   TSH 2.15 06/17/2020   PSA 5.47 (H) 08/03/2016   HGBA1C 5.8 06/17/2020    CT Head Wo Contrast  Result Date: 03/30/2020 CLINICAL DATA:  Mental status change EXAM: CT HEAD WITHOUT CONTRAST TECHNIQUE: Contiguous axial images were obtained from the base of the skull through the vertex without intravenous contrast. COMPARISON:  02/03/2019 FINDINGS: Brain: There is no acute intracranial hemorrhage, mass effect, or edema. No new loss of gray-white differentiation. Small chronic right frontal infarct. There is no extra-axial fluid collection. Ventricles and sulci are stable in size and configuration. Vascular: There is atherosclerotic calcification at the skull base. Skull: Calvarium is unremarkable. Sinuses/Orbits: Minor mucosal thickening. Other: None. IMPRESSION: No acute intracranial abnormality. Small chronic right parietal infarct. Electronically Signed   By: Macy Mis M.D.   On: 03/30/2020 08:54   MR BRAIN WO CONTRAST  Result Date: 03/30/2020 CLINICAL DATA:  Mental status change, unknown cause. Additional provided: Patient complains of weakness, feeling as though he is going to fall over, "swim E" head, arm tingling, constipation. EXAM: MRI HEAD WITHOUT CONTRAST TECHNIQUE: Multiplanar, multiecho pulse sequences of the brain and surrounding structures were obtained without intravenous contrast. COMPARISON:  Head CT 03/30/2020.  Brain MRI 01/15/2020. FINDINGS: Brain: Cerebral volume is normal for age. Small chronic right frontal lobe cortical infarct. Subcentimeter focus of T2/FLAIR hyperintensity within the posterior left frontal lobe white matter, likely reflecting minimal  chronic small vessel ischemic disease. Redemonstrated punctate focus of SWI signal loss in the left parietal lobe compatible with chronic microhemorrhage. There is no acute infarct. No evidence of intracranial mass. No chronic intracranial blood products. No extra-axial fluid collection. No midline shift. Vascular: Expected proximal arterial flow voids. Skull and upper cervical spine: No focal marrow lesion Sinuses/Orbits: Visualized orbits show no acute finding. Bilateral lens replacements. Trace ethmoid sinus mucosal thickening. Small right maxillary sinus mucous retention cysts. Other: Trace fluid within the left mastoid air cells. Right TMJ osteoarthrosis IMPRESSION: No evidence of acute intracranial abnormality, including acute infarction. Stable MRI appearance of the brain as compared to the prior exam of 01/15/2020. Small chronic right frontal lobe cortical infarct. Subcentimeter focus of T2 hyperintensity within the posterior left frontal white matter, likely reflecting minimal chronic small vessel ischemic disease. Mild paranasal sinus disease as described. Electronically Signed   By: Kellie Simmering DO   On: 03/30/2020 13:29   DG Chest Portable 1 View  Result Date: 03/30/2020 CLINICAL DATA:  Weakness.  Evaluate for pulmonary edema. EXAM: PORTABLE CHEST 1 VIEW COMPARISON:  04/30/2018 FINDINGS: Heart size  is mildly enlarged. Pulmonary vascular congestion. No overt edema, pleural effusion, or airspace consolidation. Calcified granuloma noted in the right upper lobe. IMPRESSION: Cardiac enlargement and pulmonary vascular congestion. Electronically Signed   By: Kerby Moors M.D.   On: 03/30/2020 08:50       Assessment & Plan:   Problem List Items Addressed This Visit     Abdominal aortic aneurysm (AAA) ([place])    Followed by [facility].  Evaluated 01/2020.  Continues to follow.       Carotid artery disease ([place])    Saw [facility] 01/2020.  Stable.  Recommended f/u in one year.       Chronic heart failure  with preserved ejection fraction ([place])    Followed by cardiology.  ECHO - 05/2019 - EF 55-60%.  Continues on losartan, imdur and amlodipine.  Blood pressure doing well.  Stable.       COPD (chronic obstructive pulmonary disease) (HCC)    Breathing stable.        GERD without esophagitis    Continue protonix.        Hypercholesterolemia    Continue lovastatin.  Follow lipid panel and liver function tests.        Relevant Orders   Lipid panel   Hepatic function panel   Hyperglycemia    Follow met b and a1c.       Relevant Orders   Hemoglobin A1c   Hypertension, essential    Blood pressure doing well as outlined.  Continue imdur, amlodipine and losartan.  Continue to follow blood pressure.  Continue to monitor metabolic panel.        Relevant Orders   Basic metabolic panel   Iron deficiency anemia due to chronic blood loss    Continue to follow with hematology.        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  Evaluated 01/2020.  Stable.  Recommended f/u scanning in 2 years.       Sleep apnea    Continue cpap.  Using 8-9 hours per day.         Einar Pheasant, MD

## 2020-10-12 ENCOUNTER — Other Ambulatory Visit: Payer: Self-pay | Admitting: Internal Medicine

## 2020-10-18 ENCOUNTER — Encounter: Payer: Self-pay | Admitting: Internal Medicine

## 2020-10-18 NOTE — Assessment & Plan Note (Signed)
Breathing stable.

## 2020-10-18 NOTE — Assessment & Plan Note (Signed)
Saw [facility] 01/2020.  Stable.  Recommended f/u in one year.

## 2020-10-18 NOTE — Assessment & Plan Note (Signed)
Blood pressure doing well as outlined.  Continue imdur, amlodipine and losartan.  Continue to follow blood pressure.  Continue to monitor metabolic panel.

## 2020-10-18 NOTE — Assessment & Plan Note (Signed)
Continue cpap.  Using 8-9 hours per day.

## 2020-10-18 NOTE — Assessment & Plan Note (Signed)
Continue protonix  

## 2020-10-18 NOTE — Assessment & Plan Note (Signed)
Evaluated by [facility].  Evaluated 01/2020.  Stable.  Recommended f/u scanning in 2 years.  

## 2020-10-18 NOTE — Assessment & Plan Note (Signed)
Followed by [facility].  Evaluated 01/2020.  Continues to follow.

## 2020-10-18 NOTE — Assessment & Plan Note (Signed)
Continue lovastatin.  Follow lipid panel and liver function tests.   

## 2020-10-18 NOTE — Assessment & Plan Note (Signed)
Follow met b and a1c.  

## 2020-10-18 NOTE — Assessment & Plan Note (Signed)
Continue to follow with hematology

## 2020-10-18 NOTE — Assessment & Plan Note (Signed)
Followed by cardiology.  ECHO - 05/2019 - EF 55-60%.  Continues on losartan, imdur and amlodipine.  Blood pressure doing well.  Stable.  

## 2020-10-25 ENCOUNTER — Other Ambulatory Visit: Payer: Self-pay

## 2020-10-25 ENCOUNTER — Other Ambulatory Visit (INDEPENDENT_AMBULATORY_CARE_PROVIDER_SITE_OTHER): Payer: Medicare HMO

## 2020-10-25 DIAGNOSIS — E7800 Pure hypercholesterolemia, unspecified: Secondary | ICD-10-CM | POA: Diagnosis not present

## 2020-10-25 DIAGNOSIS — I10 Essential (primary) hypertension: Secondary | ICD-10-CM

## 2020-10-25 DIAGNOSIS — R739 Hyperglycemia, unspecified: Secondary | ICD-10-CM | POA: Diagnosis not present

## 2020-10-25 LAB — BASIC METABOLIC PANEL
BUN: 19 mg/dL (ref 6–23)
CO2: 24 mEq/L (ref 19–32)
Calcium: 9.5 mg/dL (ref 8.4–10.5)
Chloride: 106 mEq/L (ref 96–112)
Creatinine, Ser: 1.21 mg/dL (ref 0.40–1.50)
GFR: 55.61 mL/min — ABNORMAL LOW (ref >60.00)
Glucose, Bld: 92 mg/dL (ref 70–99)
Potassium: 3.9 mEq/L (ref 3.5–5.1)
Sodium: 140 mEq/L (ref 135–145)

## 2020-10-25 LAB — HEPATIC FUNCTION PANEL
ALT: 12 U/L (ref 0–53)
AST: 17 U/L (ref 0–37)
Albumin: 3.9 g/dL (ref 3.5–5.2)
Alkaline Phosphatase: 53 U/L (ref 39–117)
Bilirubin, Direct: 0.1 mg/dL (ref 0.0–0.3)
Total Bilirubin: 0.5 mg/dL (ref 0.2–1.2)
Total Protein: 6.5 g/dL (ref 6.0–8.3)

## 2020-10-25 LAB — LIPID PANEL
Cholesterol: 128 mg/dL (ref 0–200)
HDL: 32.2 mg/dL — ABNORMAL LOW (ref >39.00)
LDL Cholesterol: 63 mg/dL (ref 0–99)
NonHDL: 95.84
Total CHOL/HDL Ratio: 4
Triglycerides: 166 mg/dL — ABNORMAL HIGH (ref 0.0–149.0)
VLDL: 33.2 mg/dL (ref 0.0–40.0)

## 2020-10-25 LAB — HEMOGLOBIN A1C: Hgb A1c MFr Bld: 5.9 % (ref 4.6–6.5)

## 2020-10-26 ENCOUNTER — Telehealth: Payer: Self-pay

## 2020-10-26 NOTE — Telephone Encounter (Signed)
LMTCB in regards to lab results.  

## 2020-11-09 ENCOUNTER — Other Ambulatory Visit: Payer: Self-pay | Admitting: Internal Medicine

## 2020-11-09 ENCOUNTER — Other Ambulatory Visit: Payer: Self-pay | Admitting: Nurse Practitioner

## 2020-11-11 ENCOUNTER — Telehealth: Payer: Self-pay | Admitting: Internal Medicine

## 2020-11-11 NOTE — Telephone Encounter (Signed)
Patient had COVID in July and is wondering how long he has to wait until getting his second COVID booster shot and his flu shot.Please advise.

## 2020-11-11 NOTE — Telephone Encounter (Signed)
As long as he is recovered from covid, ok to get flu vaccine.  Also, regarding covid vaccine,  individuals with recent, documented SARS-CoV-2 infection (including those who are diagnosed after initiating a vaccine series) should have at least recovered from acute infection and met criteria for discontinuation of isolation precautions before receiving a vaccine dose. Additionally, given the low risk of reinfection soon after prior infection, it is reasonable for individuals with SARS-CoV-2 infection to wait to receive a vaccine dose until three months after infection [40].

## 2020-11-12 NOTE — Telephone Encounter (Signed)
Patient aware of below.

## 2020-11-17 ENCOUNTER — Other Ambulatory Visit: Payer: Self-pay

## 2020-11-17 ENCOUNTER — Ambulatory Visit (INDEPENDENT_AMBULATORY_CARE_PROVIDER_SITE_OTHER): Payer: Medicare HMO

## 2020-11-17 DIAGNOSIS — Z23 Encounter for immunization: Secondary | ICD-10-CM | POA: Diagnosis not present

## 2020-11-19 ENCOUNTER — Inpatient Hospital Stay: Payer: Medicare HMO

## 2020-11-19 ENCOUNTER — Other Ambulatory Visit: Payer: Self-pay

## 2020-11-19 ENCOUNTER — Encounter: Payer: Self-pay | Admitting: Internal Medicine

## 2020-11-19 ENCOUNTER — Inpatient Hospital Stay: Payer: Medicare HMO | Attending: Internal Medicine

## 2020-11-19 ENCOUNTER — Inpatient Hospital Stay: Payer: Medicare HMO | Admitting: Internal Medicine

## 2020-11-19 DIAGNOSIS — C7951 Secondary malignant neoplasm of bone: Secondary | ICD-10-CM | POA: Insufficient documentation

## 2020-11-19 DIAGNOSIS — C61 Malignant neoplasm of prostate: Secondary | ICD-10-CM | POA: Diagnosis not present

## 2020-11-19 DIAGNOSIS — Z5111 Encounter for antineoplastic chemotherapy: Secondary | ICD-10-CM | POA: Diagnosis present

## 2020-11-19 LAB — CBC WITH DIFFERENTIAL/PLATELET
Abs Immature Granulocytes: 0 10*3/uL (ref 0.00–0.07)
Basophils Absolute: 0 10*3/uL (ref 0.0–0.1)
Basophils Relative: 0 %
Eosinophils Absolute: 0.3 10*3/uL (ref 0.0–0.5)
Eosinophils Relative: 6 %
HCT: 36.2 % — ABNORMAL LOW (ref 39.0–52.0)
Hemoglobin: 12.3 g/dL — ABNORMAL LOW (ref 13.0–17.0)
Immature Granulocytes: 0 %
Lymphocytes Relative: 34 %
Lymphs Abs: 1.7 10*3/uL (ref 0.7–4.0)
MCH: 30.2 pg (ref 26.0–34.0)
MCHC: 34 g/dL (ref 30.0–36.0)
MCV: 88.9 fL (ref 80.0–100.0)
Monocytes Absolute: 0.6 10*3/uL (ref 0.1–1.0)
Monocytes Relative: 11 %
Neutro Abs: 2.5 10*3/uL (ref 1.7–7.7)
Neutrophils Relative %: 49 %
Platelets: 186 10*3/uL (ref 150–400)
RBC: 4.07 MIL/uL — ABNORMAL LOW (ref 4.22–5.81)
RDW: 13.2 % (ref 11.5–15.5)
WBC: 5.1 10*3/uL (ref 4.0–10.5)
nRBC: 0 % (ref 0.0–0.2)

## 2020-11-19 LAB — PSA: Prostatic Specific Antigen: <0.01 ng/mL (ref 0.00–4.00)

## 2020-11-19 LAB — COMPREHENSIVE METABOLIC PANEL
ALT: 15 U/L (ref 0–44)
AST: 21 U/L (ref 15–41)
Albumin: 3.8 g/dL (ref 3.5–5.0)
Alkaline Phosphatase: 53 U/L (ref 38–126)
Anion gap: 9 (ref 5–15)
BUN: 24 mg/dL — ABNORMAL HIGH (ref 8–23)
CO2: 23 mmol/L (ref 22–32)
Calcium: 9.1 mg/dL (ref 8.9–10.3)
Chloride: 105 mmol/L (ref 98–111)
Creatinine, Ser: 1.39 mg/dL — ABNORMAL HIGH (ref 0.61–1.24)
GFR, Estimated: 51 mL/min — ABNORMAL LOW (ref >60)
Glucose, Bld: 130 mg/dL — ABNORMAL HIGH (ref 70–99)
Potassium: 4 mmol/L (ref 3.5–5.1)
Sodium: 137 mmol/L (ref 135–145)
Total Bilirubin: 0.6 mg/dL (ref 0.3–1.2)
Total Protein: 6.8 g/dL (ref 6.5–8.1)

## 2020-11-19 MED ORDER — LEUPROLIDE ACETATE (3 MONTH) 22.5 MG ~~LOC~~ KIT
22.5000 mg | PACK | Freq: Once | SUBCUTANEOUS | Status: AC
  Administered 2020-11-19: 22.5 mg via SUBCUTANEOUS
  Filled 2020-11-19: qty 22.5

## 2020-11-19 NOTE — Assessment & Plan Note (Addendum)
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 Stage IV-on Eligard; Bone scan October 2020-improved;  JUNE 2022- PSA- <0.1.  Eligard today. [22.'5mg'$  q3M]; change to q4M  # Iron deficient anemia/CKD- III-[s/p EGD March 2021]--status post IV iron infusion x1 [FEB 2022]-today hemoglobin is 12.3. STABLE; Hold off on infusion.  # Dizzy spells- ? Seizures [s/p EEG; Dr.Shah]; MRI brain-no evidence of stroke. S/p ENT evaluation-STABLE.   # A. fib paroxysmal-sinus rhythm; aspirin Plavix- STABLE.   # DISPOSITION:  # Eligard today; HOLD  Venofer # Follow up in 4 months; MD;labs- cbc.cmp/PSA eligard; possible venofer- Dr.B  Cc; Dr.Scott/

## 2020-11-19 NOTE — Progress Notes (Signed)
[place] OFFICE PROGRESS NOTE  Patient Care Team: Einar Pheasant, MD as PCP - General (Internal Medicine) [name], MD as PCP - Cardiology (Cardiology) [name], MD as Consulting Physician (Cardiology)  Cancer Staging No matching staging information was found for the patient.   Oncology History Overview Note  Nov 2017- completed IM RT radiation therapy to his prostate and pelvic nodes for Gleason 7 (4+3) adenocarcinoma the prostate presenting the PSA of 7.8.  # JAN 2019- METASTATIC PROSTATE CA to Bone [low volume met on bone scan; CT- NED]; PSA ~50; March 25th 2019- Lupron 45 mg IM [Urology q 12M]; June 26, 2018-Eligard every 3 months[intolerance to Lupron/question A. Fib/injection site pain  # May 23rd Zytiga 284m/day; no prednisone; STOPPED MARCH 17th 2021-repeated severe hypokalemia;   # Barretts- EGD/ colo [[place]2021; Dr.Byrnett].   # OBenita Stabile [place]]; Oct 2019-paroxysmal A.fib/not on eliquis; [Dr.Khan] -Dr.Arida ; Dizzy spells [Dr.Shah]  ------------------------------------------------------------------  DIAGNOSIS: [ JAN 2019]- Met- PROSTATE CANCER  STAGE:  IV       ;GOALS: PALLIATIVE  CURRENT/MOST RECENT THERAPY [ march 2019]- Lupron; May 23rd 2019- Zytiga 2527mday; discontinued-March 2021.     [facility]    INTERVAL HISTORY: Patient is with his wife.  He is walking independently.  JaMaylene Roes89.[id number].  male pleasant patient above history of metastatic prostate cancer on ADT is here for follow-up.  Patient denies any worsening shortness of breath or cough.  Denies any worsening spells of dizziness.  No headaches.  No worsening bone pain.  Patient has gained weight.  Denies any worsening hot flashes.  No difficulty with urination.  Review of Systems  Constitutional:  Negative for chills, diaphoresis, fever, malaise/fatigue and weight loss.  HENT:  Negative for nosebleeds and sore throat.   Eyes:  Negative  for double vision.  Respiratory:  Negative for cough, hemoptysis, sputum production, shortness of breath and wheezing.   Cardiovascular:  Negative for chest pain, palpitations, orthopnea and leg swelling.  Gastrointestinal:  Negative for abdominal pain, blood in stool, constipation, diarrhea, heartburn, melena, nausea and vomiting.  Genitourinary:  Negative for dysuria, frequency and urgency.  Musculoskeletal:  Positive for myalgias. Negative for back pain and joint pain.  Skin: Negative.  Negative for itching and rash.  Neurological:  Positive for dizziness. Negative for tingling, focal weakness, weakness and headaches.  Endo/Heme/Allergies:  Does not bruise/bleed easily.  Psychiatric/Behavioral:  Negative for depression. The patient is not nervous/anxious and does not have insomnia.    PAST MEDICAL HISTORY :  Past Medical History:  Diagnosis Date   (HFpEF) heart failure with preserved ejection fraction (HCFlora   a. 05/2018 Echo: Nl EF, Gr1 DD, sev dil LA, mild MR/TR, mild PAH; b. 05/2019 Echo: EF 55-60%, no rwma, mild LVH. Nl PASP. Mildly dil LA. Triv MR. Mild AoV sclerosis w/o stenosis. Mildly dil Asc AO (3921m   Allergic rhinitis    Barrett's esophagus 10/21/13   Bone cancer (HCC)    CKD (chronic kidney disease), stage III (HCC)    Colon polyp, hyperplastic    COPD (chronic obstructive pulmonary disease) (HCC)    mild COPD. former smoker   Coronary artery disease    a. 2003 s/p PCI ([facility]); b. 2004 s/p PCI x 2 ([facility]); c. 11/2017 Cath: LM nl, LAD mild dzs, LCX patent stent w/ 30% distal edge restenosis, RCA patent distal stent w/ 30% prox edge stenosis. Nl EF->Med Rx.   Diverticulosis    Fundic  gland polyps of stomach, benign    Gastritis    GERD (gastroesophageal reflux disease)    Hypercholesteremia    Hypertension    Prostate cancer ([place])    Prostatism    Reflux esophagitis    Renal stones    Skin cancer    left cheek/lesion excised   Sleep apnea    Tachycardia    a. 11/2017  admit w/ tachycardia/? afib-->no afib noted upon review (amio/OAC d/c'd).   TIA (transient ischemic attack)     PAST SURGICAL HISTORY :   Past Surgical History:  Procedure Laterality Date   CARDIAC CATHETERIZATION     COLONOSCOPY WITH PROPOFOL N/A 09/13/2015   Procedure: COLONOSCOPY WITH PROPOFOL;  Surgeon: Lollie Sails, MD;  Location[facility] ENDOSCOPY;  Service: Endoscopy;  Laterality: N/A;   COLONOSCOPY WITH PROPOFOL N/A 05/07/2019   Procedure: COLONOSCOPY WITH PROPOFOL;  Surgeon: Robert Bellow, MD;  Location: [facility] ENDOSCOPY;  Service: Endoscopy;  Laterality: N/A;   CORONARY ANGIOPLASTY     ESOPHAGOGASTRODUODENOSCOPY (EGD) WITH PROPOFOL N/A 09/13/2015   Procedure: ESOPHAGOGASTRODUODENOSCOPY (EGD) WITH PROPOFOL;  Surgeon: Lollie Sails, MD;  Location: [facility] ENDOSCOPY;  Service: Endoscopy;  Laterality: N/A;   ESOPHAGOGASTRODUODENOSCOPY (EGD) WITH PROPOFOL N/A 12/28/2015   Procedure: ESOPHAGOGASTRODUODENOSCOPY (EGD) WITH PROPOFOL;  Surgeon: Lollie Sails, MD;  Location: [facility] ENDOSCOPY;  Service: Endoscopy;  Laterality: N/A;   ESOPHAGOGASTRODUODENOSCOPY (EGD) WITH PROPOFOL N/A 06/16/2016   Procedure: ESOPHAGOGASTRODUODENOSCOPY (EGD) WITH PROPOFOL;  Surgeon: Lollie Sails, MD;  Location: [facility] ENDOSCOPY;  Service: Endoscopy;  Laterality: N/A;   ESOPHAGOGASTRODUODENOSCOPY (EGD) WITH PROPOFOL N/A 05/07/2019   Procedure: ESOPHAGOGASTRODUODENOSCOPY (EGD) WITH PROPOFOL;  Surgeon: Robert Bellow, MD;  Location: [facility] ENDOSCOPY;  Service: Endoscopy;  Laterality: N/A;   EYE SURGERY  2013   CATARACT EXTRACTION   GANGLION CYST EXCISION Right 05/18/2017   Procedure: REMOVAL GANGLION CYST ANKLE;  Surgeon: Samara Deist, DPM;  Location: [facility] ORS;  Service: Podiatry;  Laterality: Right;   heart cath stent     LEFT HEART CATH AND CORONARY ANGIOGRAPHY Right 12/03/2017   Procedure: Left Heart Cath and Coronary Angiography with possible coronary intervention;  Surgeon: Dionisio David, MD;   Location: [place] CV LAB;  Service: Cardiovascular;  Laterality: Right;   PROSTATE BIOPSY     stents     multiple    FAMILY HISTORY :   Family History  Problem Relation Age of Onset   Cancer Mother        gastric and lung   Cancer Father        multiple myeloma   Stroke Father    Cancer Sister        leukemia   Cancer Brother        leukemia   Cancer Brother        kidney   Cancer Daughter        Uterine   Cancer Other        [employer] copy Son): Prostate    SOCIAL HISTORY:   Social History   Tobacco Use   Smoking status: Former    Packs/day: 1.00    Years: 35.00    Pack years: 35.00    Types: Cigarettes    Quit date: 03/06/1986    Years since quitting: 34.7   Smokeless tobacco: Former    Types: Nurse, children's Use: Never used  Substance Use Topics   Alcohol use: No   Drug use: No    ALLERGIES:  is  allergic to atorvastatin.  MEDICATIONS:  Current Outpatient Medications  Medication Sig Dispense Refill   amLODipine (NORVASC) 10 MG tablet TAKE 1 TABLET BY MOUTH ONCE DAILY 90 tablet 1   aspirin EC 81 MG tablet Take 81 mg by mouth daily.     azelastine (ASTELIN) 0.1 % nasal spray Place 1 spray into both nostrils as needed for rhinitis. Use in each nostril as directed     calcium citrate-vitamin D (CITRACAL+D) 315-200 MG-UNIT tablet Take 1 tablet by mouth daily.     cholecalciferol (VITAMIN D) 1000 units tablet Take 1,000 Units by mouth daily.      clopidogrel (PLAVIX) 75 MG tablet TAKE 1 TABLET BY MOUTH ONCE DAILY 90 tablet 0   ferrous sulfate 325 (65 FE) MG tablet Take by mouth.     fexofenadine (ALLEGRA) 180 MG tablet Take 180 mg by mouth daily as needed for allergies or rhinitis.      fluticasone (FLONASE) 50 MCG/ACT nasal spray Place 2 sprays into both nostrils daily.      isosorbide mononitrate (IMDUR) 30 MG 24 hr tablet TAKE 1 TABLET BY MOUTH ONCE DAILY 90 tablet 2   losartan (COZAAR) 50 MG tablet Take 1 tablet (50 mg total) by mouth  daily. 90 tablet 1   lovastatin (MEVACOR) 40 MG tablet TAKE 2 TABLETS BY MOUTH AT BEDTIME 180 tablet 1   Multiple Vitamin (MULTI-VITAMINS) TABS Take 1 tablet by mouth daily.      pantoprazole (PROTONIX) 40 MG tablet Take 40 mg by mouth 2 (two) times daily.      No current facility-administered medications for this visit.    PHYSICAL EXAMINATION: ECOG PERFORMANCE STATUS: 0 - Asymptomatic  BP 137/75   Pulse 69   Temp 97.6 F (36.4 C) (Tympanic)   Ht '5\' 10"'  (1.778 m)   Wt 225 lb (102.1 kg)   BMI 32.28 kg/m   Filed Weights   11/19/20 1331  Weight: 225 lb (102.1 kg)    Physical Exam HENT:     Head: Normocephalic and atraumatic.     Mouth/Throat:     Pharynx: No oropharyngeal exudate.  Eyes:     Pupils: Pupils are equal, round, and reactive to light.  Cardiovascular:     Rate and Rhythm: Normal rate and regular rhythm.  Pulmonary:     Effort: No respiratory distress.     Breath sounds: No wheezing.  Abdominal:     General: Bowel sounds are normal. There is no distension.     Palpations: Abdomen is soft. There is no mass.     Tenderness: There is no abdominal tenderness. There is no guarding or rebound.  Musculoskeletal:        General: No tenderness. Normal range of motion.     Cervical back: Normal range of motion and neck supple.  Skin:    General: Skin is warm.  Neurological:     Mental Status: He is alert and oriented to person, place, and time.  Psychiatric:        Mood and Affect: Affect normal.    LABORATORY DATA:  I have reviewed the data as listed    Component Value Date/Time   NA 137 11/19/2020 1259   NA 140 09/16/2013 0438   K 4.0 11/19/2020 1259   K 4.2 09/16/2013 0438   CL 105 11/19/2020 1259   CL 109 (H) 09/16/2013 0438   CO2 23 11/19/2020 1259   CO2 24 09/16/2013 0438   GLUCOSE 130 (H) 11/19/2020 1259  GLUCOSE 101 (H) 09/16/2013 0438   BUN 24 (H) 11/19/2020 1259   BUN 20 (H) 09/16/2013 0438   CREATININE 1.39 (H) 11/19/2020 1259    CREATININE 1.28 09/16/2013 0438   CALCIUM 9.1 11/19/2020 1259   CALCIUM 8.3 (L) 09/16/2013 0438   PROT 6.8 11/19/2020 1259   PROT 6.7 09/15/2013 0755   ALBUMIN 3.8 11/19/2020 1259   ALBUMIN 3.5 09/15/2013 0755   AST 21 11/19/2020 1259   AST 37 09/15/2013 0755   ALT 15 11/19/2020 1259   ALT 28 09/15/2013 0755   ALKPHOS 53 11/19/2020 1259   ALKPHOS 44 (L) 09/15/2013 0755   BILITOT 0.6 11/19/2020 1259   BILITOT 0.5 09/15/2013 0755   GFRNONAA 51 (L) 11/19/2020 1259   GFRNONAA 54 (L) 09/16/2013 0438   GFRAA 59 (L) 10/07/2019 1305   GFRAA >60 09/16/2013 0438    No results found for: SPEP, UPEP  Lab Results  Component Value Date   WBC 5.1 11/19/2020   NEUTROABS 2.5 11/19/2020   HGB 12.3 (L) 11/19/2020   HCT 36.2 (L) 11/19/2020   MCV 88.9 11/19/2020   PLT 186 11/19/2020      Chemistry      Component Value Date/Time   NA 137 11/19/2020 1259   NA 140 09/16/2013 0438   K 4.0 11/19/2020 1259   K 4.2 09/16/2013 0438   CL 105 11/19/2020 1259   CL 109 (H) 09/16/2013 0438   CO2 23 11/19/2020 1259   CO2 24 09/16/2013 0438   BUN 24 (H) 11/19/2020 1259   BUN 20 (H) 09/16/2013 0438   CREATININE 1.39 (H) 11/19/2020 1259   CREATININE 1.28 09/16/2013 0438      Component Value Date/Time   CALCIUM 9.1 11/19/2020 1259   CALCIUM 8.3 (L) 09/16/2013 0438   ALKPHOS 53 11/19/2020 1259   ALKPHOS 44 (L) 09/15/2013 0755   AST 21 11/19/2020 1259   AST 37 09/15/2013 0755   ALT 15 11/19/2020 1259   ALT 28 09/15/2013 0755   BILITOT 0.6 11/19/2020 1259   BILITOT 0.5 09/15/2013 0755       RADIOGRAPHIC STUDIES: I have personally reviewed the radiological images as listed and agreed with the findings in the report. No results found.   ASSESSMENT & PLAN:  Prostate cancer ([place]) # Castrate sensitive-metastatic prostate cancer to the bone. Stage IV-on Eligard; Bone scan October 2020-improved;  JUNE 2022- PSA- <0.1.  Eligard today. [22.51m q3M]; change to q4M  # Iron deficient anemia/CKD-  III-[s/p EGD March 2021]--status post IV iron infusion x1 [FEB 2022]-today hemoglobin is 12.3. STABLE; Hold off on infusion.  # Dizzy spells- ? Seizures [s/p EEG; Dr.Shah]; MRI brain-no evidence of stroke. S/p ENT evaluation-STABLE.   # A. fib paroxysmal-sinus rhythm; aspirin Plavix- STABLE.   # DISPOSITION:  # Eligard today; HOLD  Venofer # Follow up in 4 months; MD;labs- cbc.cmp/PSA eligard; possible venofer- Dr.B  Cc; Dr.Scott/   Orders Placed This Encounter  Procedures   CBC with Differential    Standing Status:   Future    Standing Expiration Date:   11/19/2021   Comprehensive metabolic panel    Standing Status:   Future    Standing Expiration Date:   11/19/2021   PSA    Standing Status:   Future    Standing Expiration Date:   11/19/2021   All questions were answered. The patient knows to call the clinic with any problems, questions or concerns.      GCammie Sickle MD 11/20/2020 12:23 AM

## 2020-11-20 ENCOUNTER — Encounter: Payer: Self-pay | Admitting: Internal Medicine

## 2021-01-10 ENCOUNTER — Other Ambulatory Visit: Payer: Medicare HMO

## 2021-01-12 ENCOUNTER — Emergency Department
Admission: EM | Admit: 2021-01-12 | Discharge: 2021-01-12 | Disposition: A | Discharge status: Home / Self Care | Payer: Medicare HMO | Attending: Emergency Medicine | Admitting: Emergency Medicine

## 2021-01-12 ENCOUNTER — Emergency Department: Payer: Medicare HMO

## 2021-01-12 ENCOUNTER — Other Ambulatory Visit: Payer: Self-pay

## 2021-01-12 ENCOUNTER — Ambulatory Visit (INDEPENDENT_AMBULATORY_CARE_PROVIDER_SITE_OTHER): Payer: Medicare HMO | Admitting: Internal Medicine

## 2021-01-12 ENCOUNTER — Telehealth: Payer: Self-pay | Admitting: Internal Medicine

## 2021-01-12 DIAGNOSIS — D500 Iron deficiency anemia secondary to blood loss (chronic): Secondary | ICD-10-CM

## 2021-01-12 DIAGNOSIS — K2270 Barrett's esophagus without dysplasia: Secondary | ICD-10-CM

## 2021-01-12 DIAGNOSIS — I5032 Chronic diastolic (congestive) heart failure: Secondary | ICD-10-CM

## 2021-01-12 DIAGNOSIS — Z7902 Long term (current) use of antithrombotics/antiplatelets: Secondary | ICD-10-CM | POA: Diagnosis not present

## 2021-01-12 DIAGNOSIS — N183 Chronic kidney disease, stage 3 unspecified: Secondary | ICD-10-CM | POA: Diagnosis not present

## 2021-01-12 DIAGNOSIS — Z7982 Long term (current) use of aspirin: Secondary | ICD-10-CM | POA: Insufficient documentation

## 2021-01-12 DIAGNOSIS — I509 Heart failure, unspecified: Secondary | ICD-10-CM | POA: Insufficient documentation

## 2021-01-12 DIAGNOSIS — Z79899 Other long term (current) drug therapy: Secondary | ICD-10-CM | POA: Insufficient documentation

## 2021-01-12 DIAGNOSIS — J449 Chronic obstructive pulmonary disease, unspecified: Secondary | ICD-10-CM

## 2021-01-12 DIAGNOSIS — I130 Hypertensive heart and chronic kidney disease with heart failure and stage 1 through stage 4 chronic kidney disease, or unspecified chronic kidney disease: Secondary | ICD-10-CM | POA: Insufficient documentation

## 2021-01-12 DIAGNOSIS — Z8673 Personal history of transient ischemic attack (TIA), and cerebral infarction without residual deficits: Secondary | ICD-10-CM

## 2021-01-12 DIAGNOSIS — I724 Aneurysm of artery of lower extremity: Secondary | ICD-10-CM

## 2021-01-12 DIAGNOSIS — Z8546 Personal history of malignant neoplasm of prostate: Secondary | ICD-10-CM | POA: Insufficient documentation

## 2021-01-12 DIAGNOSIS — R202 Paresthesia of skin: Secondary | ICD-10-CM | POA: Insufficient documentation

## 2021-01-12 DIAGNOSIS — Z7952 Long term (current) use of systemic steroids: Secondary | ICD-10-CM | POA: Insufficient documentation

## 2021-01-12 DIAGNOSIS — R42 Dizziness and giddiness: Secondary | ICD-10-CM

## 2021-01-12 DIAGNOSIS — I2510 Atherosclerotic heart disease of native coronary artery without angina pectoris: Secondary | ICD-10-CM

## 2021-01-12 DIAGNOSIS — Z87891 Personal history of nicotine dependence: Secondary | ICD-10-CM | POA: Diagnosis not present

## 2021-01-12 DIAGNOSIS — G4730 Sleep apnea, unspecified: Secondary | ICD-10-CM

## 2021-01-12 DIAGNOSIS — R001 Bradycardia, unspecified: Secondary | ICD-10-CM | POA: Diagnosis not present

## 2021-01-12 DIAGNOSIS — R739 Hyperglycemia, unspecified: Secondary | ICD-10-CM

## 2021-01-12 DIAGNOSIS — I714 Abdominal aortic aneurysm, without rupture, unspecified: Secondary | ICD-10-CM

## 2021-01-12 DIAGNOSIS — I779 Disorder of arteries and arterioles, unspecified: Secondary | ICD-10-CM

## 2021-01-12 DIAGNOSIS — I10 Essential (primary) hypertension: Secondary | ICD-10-CM

## 2021-01-12 DIAGNOSIS — Z85830 Personal history of malignant neoplasm of bone: Secondary | ICD-10-CM | POA: Insufficient documentation

## 2021-01-12 DIAGNOSIS — E7800 Pure hypercholesterolemia, unspecified: Secondary | ICD-10-CM

## 2021-01-12 DIAGNOSIS — K219 Gastro-esophageal reflux disease without esophagitis: Secondary | ICD-10-CM

## 2021-01-12 LAB — URINALYSIS, ROUTINE W REFLEX MICROSCOPIC
Bilirubin Urine: NEGATIVE
Glucose, UA: NEGATIVE mg/dL
Hgb urine dipstick: NEGATIVE
Ketones, ur: NEGATIVE mg/dL
Leukocytes,Ua: NEGATIVE
Nitrite: NEGATIVE
Protein, ur: NEGATIVE mg/dL
Specific Gravity, Urine: 1.006 (ref 1.005–1.030)
pH: 7 (ref 5.0–8.0)

## 2021-01-12 LAB — BASIC METABOLIC PANEL
Anion gap: 8 (ref 5–15)
BUN: 17 mg/dL (ref 8–23)
CO2: 23 mmol/L (ref 22–32)
Calcium: 9.4 mg/dL (ref 8.9–10.3)
Chloride: 107 mmol/L (ref 98–111)
Creatinine, Ser: 1.11 mg/dL (ref 0.61–1.24)
GFR, Estimated: >60 mL/min (ref >60)
Glucose, Bld: 100 mg/dL — ABNORMAL HIGH (ref 70–99)
Potassium: 3.9 mmol/L (ref 3.5–5.1)
Sodium: 138 mmol/L (ref 135–145)

## 2021-01-12 LAB — CBC
HCT: 36.7 % — ABNORMAL LOW (ref 39.0–52.0)
Hemoglobin: 12.5 g/dL — ABNORMAL LOW (ref 13.0–17.0)
MCH: 30.6 pg (ref 26.0–34.0)
MCHC: 34.1 g/dL (ref 30.0–36.0)
MCV: 90 fL (ref 80.0–100.0)
Platelets: 199 10*3/uL (ref 150–400)
RBC: 4.08 MIL/uL — ABNORMAL LOW (ref 4.22–5.81)
RDW: 12.8 % (ref 11.5–15.5)
WBC: 6 10*3/uL (ref 4.0–10.5)
nRBC: 0 % (ref 0.0–0.2)

## 2021-01-12 LAB — TROPONIN I (HIGH SENSITIVITY): Troponin I (High Sensitivity): 8 ng/L (ref <18)

## 2021-01-12 NOTE — Progress Notes (Signed)
Patient ID: Nathaniel Thompson, male   DOB: 05-29-1935, 85 y.o.   MRN: [id number]   Subjective:    Patient ID: Nathaniel Thompson, male    DOB: [birth year], 85 y.o.   MRN: [id number]  This visit occurred during the SARS-CoV-2 public health emergency.  Safety protocols were in place, including screening questions prior to the visit, additional usage of staff PPE, and extensive cleaning of exam room while observing appropriate contact time as indicated for disinfecting solutions.   Patient here for a scheduled follow up.   Chief Complaint  Patient presents with   Hyperlipidemia   Gastroesophageal Reflux   Hypertension   COPD   .   HPI Accompanied by his wife.  History obtained from both of them.  Has a history of dizziness.  Has noticed over the last 24 hours, increased problems with light headedness/dizziness.  Today is different than his usual episodes. No chest pain.  Breathing appears to be stable.  No increased heart rate or palpitations.  Noticed last night and intermittently throughout the day - head not feeling right.  Will feel light headed and then unsteady.  Has eaten this am.  Blood sugar 100 on check today.  No nausea or vomiting.  Feels chilled.     Past Medical History:  Diagnosis Date   (HFpEF) heart failure with preserved ejection fraction ([place])    a. 05/2018 Echo: Nl EF, Gr1 DD, sev dil LA, mild MR/TR, mild PAH; b. 05/2019 Echo: EF 55-60%, no rwma, mild LVH. Nl PASP. Mildly dil LA. Triv MR. Mild AoV sclerosis w/o stenosis. Mildly dil Asc AO (78m).   Allergic rhinitis    Barrett's esophagus 10/21/13   Bone cancer (HCC)    CKD (chronic kidney disease), stage III (HCC)    Colon polyp, hyperplastic    COPD (chronic obstructive pulmonary disease) (HCC)    mild COPD. former smoker   Coronary artery disease    a. 2003 s/p PCI ([facility]); b. 2004 s/p PCI x 2 ([facility]); c. 11/2017 Cath: LM nl, LAD mild dzs, LCX patent stent w/ 30% distal edge restenosis, RCA patent distal stent w/ 30% prox edge  stenosis. Nl EF->Med Rx.   Diverticulosis    Fundic gland polyps of stomach, benign    Gastritis    GERD (gastroesophageal reflux disease)    Hypercholesteremia    Hypertension    Prostate cancer ([place]    Prostatism    Reflux esophagitis    Renal stones    Skin cancer    left cheek/lesion excised   Sleep apnea    Tachycardia    a. 11/2017 admit w/ tachycardia/? afib-->no afib noted upon review (amio/OAC d/c'd).   TIA (transient ischemic attack)    Past Surgical History:  Procedure Laterality Date   CARDIAC CATHETERIZATION     COLONOSCOPY WITH PROPOFOL N/A 09/13/2015   Procedure: COLONOSCOPY WITH PROPOFOL;  Surgeon: MLollie Sails MD;  [facility]  Service: Endoscopy;  Laterality: N/A;   COLONOSCOPY WITH PROPOFOL N/A 05/07/2019   Procedure: COLONOSCOPY WITH PROPOFOL;  Surgeon: BRobert Bellow MD;  Location: [facility] ENDOSCOPY;  Service: Endoscopy;  Laterality: N/A;   CORONARY ANGIOPLASTY     ESOPHAGOGASTRODUODENOSCOPY (EGD) WITH PROPOFOL N/A 09/13/2015   Procedure: ESOPHAGOGASTRODUODENOSCOPY (EGD) WITH PROPOFOL;  Surgeon: MLollie Sails MD;  [facility]  Service: Endoscopy;  Laterality: N/A;   ESOPHAGOGASTRODUODENOSCOPY (EGD) WITH PROPOFOL N/A 12/28/2015   Procedure: ESOPHAGOGASTRODUODENOSCOPY (EGD) WITH PROPOFOL;  Surgeon: MLollie Sails MD;  [facility];  Service: Endoscopy;  Laterality: N/A;   ESOPHAGOGASTRODUODENOSCOPY (EGD) WITH PROPOFOL N/A 06/16/2016   Procedure: ESOPHAGOGASTRODUODENOSCOPY (EGD) WITH PROPOFOL;  Surgeon: Lollie Sails, MD;  [facility];  Service: Endoscopy;  Laterality: N/A;   ESOPHAGOGASTRODUODENOSCOPY (EGD) WITH PROPOFOL N/A 05/07/2019   Procedure: ESOPHAGOGASTRODUODENOSCOPY (EGD) WITH PROPOFOL;  Surgeon: Robert Bellow, MD;  Location: [facility] ENDOSCOPY;  Service: Endoscopy;  Laterality: N/A;   EYE SURGERY  2013   CATARACT EXTRACTION   GANGLION CYST EXCISION Right 05/18/2017   Procedure: REMOVAL  GANGLION CYST ANKLE;  Surgeon: Samara Deist, DPM;  Location: [facility] ORS;  Service: Podiatry;  Laterality: Right;   heart cath stent     LEFT HEART CATH AND CORONARY ANGIOGRAPHY Right 12/03/2017   Procedure: Left Heart Cath and Coronary Angiography with possible coronary intervention;  Surgeon: Dionisio David, MD;  Location: [facility];  Service: Cardiovascular;  Laterality: Right;   PROSTATE BIOPSY     stents     multiple   Family History  Problem Relation Age of Onset   Cancer Mother        gastric and lung   Cancer Father        multiple myeloma   Stroke Father    Cancer Sister        leukemia   Cancer Brother        leukemia   Cancer Brother        kidney   Cancer Daughter        Uterine   Cancer Other        [place] (Sister's Son): Prostate   Social History   Socioeconomic History   Marital status: Married    Spouse name: Not on file   Number of children: Not on file   Years of education: Not on file   Highest education level: Not on file  Occupational History   Not on file  Tobacco Use   Smoking status: Former    Packs/day: 1.00    Years: 35.00    Pack years: 35.00    Types: Cigarettes    Quit date: 03/06/1986    Years since quitting: 34.8   Smokeless tobacco: Former    Types: Nurse, children's Use: Never used  Substance and Sexual Activity   Alcohol use: No   Drug use: No   Sexual activity: Not Currently  Other Topics Concern   Not on file  Social History Narrative   Not on file   Social Determinants of Health   Financial Resource Strain: Low Risk    Difficulty of Paying Living Expenses: Not hard at all  Food Insecurity: No Food Insecurity   Worried About Charity fundraiser in the Last Year: Never true   [place] in the Last Year: Never true  Transportation Needs: No Transportation Needs   Lack of Transportation (Medical): No   Lack of Transportation (Non-Medical): No  Physical Activity: Unknown   Days of Exercise per  Week: 0 days   Minutes of Exercise per Session: Not on file  Stress: No Stress Concern Present   Feeling of Stress : Not at all  Social Connections: Unknown   Frequency of Communication with Friends and Family: Not on file   Frequency of Social Gatherings with Friends and Family: Not on file   Attends Religious Services: Not on file   Active Member of Clubs or Organizations: Not on file   Attends Archivist Meetings:  Not on file   Marital Status: Married     Review of Systems  Constitutional:  Negative for appetite change and unexpected weight change.  HENT:  Negative for congestion and sinus pressure.   Respiratory:  Negative for cough, chest tightness and shortness of breath.   Cardiovascular:  Negative for chest pain, palpitations and leg swelling.  Gastrointestinal:  Negative for abdominal pain, diarrhea, nausea and vomiting.  Genitourinary:  Negative for difficulty urinating and dysuria.  Musculoskeletal:  Negative for joint swelling and myalgias.  Skin:  Negative for color change and rash.  Neurological:  Positive for dizziness and light-headedness. Negative for headaches.  Psychiatric/Behavioral:  Negative for agitation and dysphoric mood.       Objective:     BP 106/68   Pulse 72   Temp 98 F (36.7 C)   Resp 16   Ht '5\' 10"'  (1.778 m)   Wt 222 lb (100.7 kg)   SpO2 98%   BMI 31.85 kg/m  Wt Readings from Last 3 Encounters:  01/12/21 222 lb (100.7 kg)  01/12/21 222 lb (100.7 kg)  11/19/20 225 lb (102.1 kg)    Physical Exam Constitutional:      General: He is not in acute distress.    Appearance: Normal appearance. He is well-developed.  HENT:     Head: Normocephalic and atraumatic.     Right Ear: External ear normal.     Left Ear: External ear normal.  Eyes:     General: No scleral icterus.       Right eye: No discharge.        Left eye: No discharge.  Cardiovascular:     Rate and Rhythm: Normal rate and regular rhythm.  Pulmonary:     Effort:  Pulmonary effort is normal. No respiratory distress.     Breath sounds: Normal breath sounds.  Abdominal:     General: Bowel sounds are normal.     Palpations: Abdomen is soft.     Tenderness: There is no abdominal tenderness.  Musculoskeletal:        General: No swelling or tenderness.     Cervical back: Neck supple. No tenderness.  Lymphadenopathy:     Cervical: No cervical adenopathy.  Skin:    Findings: No erythema or rash.  Neurological:     Mental Status: He is alert.     Comments: Unsteady - when standing and changing positions.  Dizziness noted with lying flat and when rising from lying position.    Psychiatric:        Mood and Affect: Mood normal.        Behavior: Behavior normal.     Outpatient Encounter Medications as of 01/12/2021  Medication Sig   amLODipine (NORVASC) 10 MG tablet TAKE 1 TABLET BY MOUTH ONCE DAILY   aspirin EC 81 MG tablet Take 81 mg by mouth daily.   azelastine (ASTELIN) 0.1 % nasal spray Place 1 spray into both nostrils as needed for rhinitis. Use in each nostril as directed   calcium citrate-vitamin D (CITRACAL+D) 315-200 MG-UNIT tablet Take 1 tablet by mouth daily.   cholecalciferol (VITAMIN D) 1000 units tablet Take 1,000 Units by mouth daily.    clopidogrel (PLAVIX) 75 MG tablet TAKE 1 TABLET BY MOUTH ONCE DAILY   ferrous sulfate 325 (65 FE) MG tablet Take by mouth.   fexofenadine (ALLEGRA) 180 MG tablet Take 180 mg by mouth daily as needed for allergies or rhinitis.    fluticasone (FLONASE) 50 MCG/ACT nasal spray  Place 2 sprays into both nostrils daily.    isosorbide mononitrate (IMDUR) 30 MG 24 hr tablet TAKE 1 TABLET BY MOUTH ONCE DAILY   losartan (COZAAR) 50 MG tablet Take 1 tablet (50 mg total) by mouth daily.   lovastatin (MEVACOR) 40 MG tablet TAKE 2 TABLETS BY MOUTH AT BEDTIME   Multiple Vitamin (MULTI-VITAMINS) TABS Take 1 tablet by mouth daily.    pantoprazole (PROTONIX) 40 MG tablet Take 40 mg by mouth 2 (two) times daily.    No  facility-administered encounter medications on file as of 01/12/2021.     Lab Results  Component Value Date   WBC 6.0 01/12/2021   HGB 12.5 (L) 01/12/2021   HCT 36.7 (L) 01/12/2021   PLT 199 01/12/2021   GLUCOSE 100 (H) 01/12/2021   CHOL 128 10/25/2020   TRIG 166.0 (H) 10/25/2020   HDL 32.20 (L) 10/25/2020   LDLCALC 63 10/25/2020   ALT 15 11/19/2020   AST 21 11/19/2020   NA 138 01/12/2021   K 3.9 01/12/2021   CL 107 01/12/2021   CREATININE 1.11 01/12/2021   BUN 17 01/12/2021   CO2 23 01/12/2021   TSH 2.15 06/17/2020   PSA 5.47 (H) 08/03/2016   HGBA1C 5.9 10/25/2020    CT Head Wo Contrast  Result Date: 03/30/2020 CLINICAL DATA:  Mental status change EXAM: CT HEAD WITHOUT CONTRAST TECHNIQUE: Contiguous axial images were obtained from the base of the skull through the vertex without intravenous contrast. COMPARISON:  02/03/2019 FINDINGS: Brain: There is no acute intracranial hemorrhage, mass effect, or edema. No new loss of gray-white differentiation. Small chronic right frontal infarct. There is no extra-axial fluid collection. Ventricles and sulci are stable in size and configuration. Vascular: There is atherosclerotic calcification at the skull base. Skull: Calvarium is unremarkable. Sinuses/Orbits: Minor mucosal thickening. Other: None. IMPRESSION: No acute intracranial abnormality. Small chronic right parietal infarct. Electronically Signed   By: Macy Mis M.D.   On: 03/30/2020 08:54   MR BRAIN WO CONTRAST  Result Date: 03/30/2020 CLINICAL DATA:  Mental status change, unknown cause. Additional provided: Patient complains of weakness, feeling as though he is going to fall over, "swim E" head, arm tingling, constipation. EXAM: MRI HEAD WITHOUT CONTRAST TECHNIQUE: Multiplanar, multiecho pulse sequences of the brain and surrounding structures were obtained without intravenous contrast. COMPARISON:  Head CT 03/30/2020.  Brain MRI 01/15/2020. FINDINGS: Brain: Cerebral volume is  normal for age. Small chronic right frontal lobe cortical infarct. Subcentimeter focus of T2/FLAIR hyperintensity within the posterior left frontal lobe white matter, likely reflecting minimal chronic small vessel ischemic disease. Redemonstrated punctate focus of SWI signal loss in the left parietal lobe compatible with chronic microhemorrhage. There is no acute infarct. No evidence of intracranial mass. No chronic intracranial blood products. No extra-axial fluid collection. No midline shift. Vascular: Expected proximal arterial flow voids. Skull and upper cervical spine: No focal marrow lesion Sinuses/Orbits: Visualized orbits show no acute finding. Bilateral lens replacements. Trace ethmoid sinus mucosal thickening. Small right maxillary sinus mucous retention cysts. Other: Trace fluid within the left mastoid air cells. Right TMJ osteoarthrosis IMPRESSION: No evidence of acute intracranial abnormality, including acute infarction. Stable MRI appearance of the brain as compared to the prior exam of 01/15/2020. Small chronic right frontal lobe cortical infarct. Subcentimeter focus of T2 hyperintensity within the posterior left frontal white matter, likely reflecting minimal chronic small vessel ischemic disease. Mild paranasal sinus disease as described. Electronically Signed   By: Kellie Simmering DO   On: 03/30/2020  13:29   DG Chest Portable 1 View  Result Date: 03/30/2020 CLINICAL DATA:  Weakness.  Evaluate for pulmonary edema. EXAM: PORTABLE CHEST 1 VIEW COMPARISON:  04/30/2018 FINDINGS: Heart size is mildly enlarged. Pulmonary vascular congestion. No overt edema, pleural effusion, or airspace consolidation. Calcified granuloma noted in the right upper lobe. IMPRESSION: Cardiac enlargement and pulmonary vascular congestion. Electronically Signed   By: Kerby Moors M.D.   On: 03/30/2020 08:50       Assessment & Plan:   Problem List Items Addressed This Visit     Abdominal aortic aneurysm (AAA)     Followed by [facility].  Evaluated 01/2020.  Continues to follow.       Barrett's esophagus    Followed by GI.  EGD 05/2019.  Continue PPI.       CAD (coronary artery disease)    No chest pain.  Breathing stable.  W/up for dizziness planned as outlined.       Carotid artery disease ([place])    Saw [facility] 01/2020.  Stable.  Recommended f/u in one year.       Chronic heart failure with preserved ejection fraction ([place])    Followed by cardiology.  ECHO - 05/2019 - EF 55-60%.  Continues on losartan, imdur and amlodipine.  Blood pressure doing well.  Stable.       COPD (chronic obstructive pulmonary disease) (HCC)    Breathing stable.       Dizziness    Has been evaluated previously for dizziness by cardiology and ENT.  Also has seen neurology.  The episodes he has described occurring in the last 24-36 hours have been different.  States his head does not feel right.  Describes a tingling sensation in his head.  Is more unsteady.  Not orthostatic on exam.  In SR.  No increased heart rate or palpitations.  Has a history of CVA.  Discussed concern given the change in symptoms, that he needs further evaluation including heart monitor, scan, etc.  Discussed ER evaluation.  He is agreeable.  Pt wife to transport him to ER.  Declined ambulance.        GERD without esophagitis    Continue protonix.       History of TIA (transient ischemic attack)    Remains on aspirin and plavix.  States has not missed medication.  Given his continued light headedness and head not feeling right with unsteadiness, will send over to ER for further evaluation.  ER notified.        Hypercholesterolemia    Continue lovastatin.  Follow lipid panel and liver function tests.        Hyperglycemia    Follow met b and a1c.       Hypertension, essential    Blood pressure as outlined.  On imdur, amlodipine and losartan. Not orthostatic on exam. Discussed lowering amlodipine to 1/2 tablet q day - to see if helped with light  headedness.  Continue to follow blood pressure.  Continue to monitor metabolic panel.        Iron deficiency anemia due to chronic blood loss    Continue f/u with hematology.  Will need cbc checked to confirm stable.  Labs to be done in ER.       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  Evaluated 01/2020.  Stable.  Recommended f/u scanning in 2 years.       Sleep apnea    Continue cpap.  I spent 45 minutes with the patient and more than 50% of the time was spent in consultation regarding the above.   Time spent discussing his current symptoms.  Time also spent discussing treatment and plan for further evaluation.   Einar Pheasant, MD

## 2021-01-12 NOTE — ED Provider Notes (Signed)
[facility] Emergency Department Provider Note   ____________________________________________   Event Date/Time   First MD Initiated Contact with Patient 01/12/21 1040     (approximate)  I have reviewed the triage vital signs and the nursing notes.   HISTORY  Chief Complaint Dizziness    HPI Nathaniel Thompson is a 85 y.o. male with past medical history of hypertension, hyperlipidemia, CAD, CHF, atrial fibrillation, CKD, COPD, and AAA who presents to the ED complaining of dizziness.  Patient reports that he has been dealing with intermittent episodes of dizziness and lightheadedness for the past few months.  He reports seeing his PCP as well as cardiology, ENT, and neurology for these episodes with unremarkable work-up.  He has more of these episodes over the past 24 to 36 hours.  He states it can come on at any time and is not associated with changes in position.  He denies any associated chest pain or shortness of breath, does state that he had some tingling sensation around his forehead as well as in both feet with the episodes earlier today.  Overall, episodes feels similar to the past but tingling sensation was new today.  He denies any numbness or weakness in his extremities, has not had any changes in his vision or speech.  He was initially seen at his PCPs office, orthostatic vital signs were unremarkable at that time.  He was subsequently referred to the ED for further evaluation.        Past Medical History:  Diagnosis Date   (HFpEF) heart failure with preserved ejection fraction ([place])    a. 05/2018 Echo: Nl EF, Gr1 DD, sev dil LA, mild MR/TR, mild PAH; b. 05/2019 Echo: EF 55-60%, no rwma, mild LVH. Nl PASP. Mildly dil LA. Triv MR. Mild AoV sclerosis w/o stenosis. Mildly dil Asc AO (28m).   Allergic rhinitis    Barrett's esophagus 10/21/13   Bone cancer (HCC)    CKD (chronic kidney disease), stage III (HCC)    Colon polyp, hyperplastic    COPD (chronic  obstructive pulmonary disease) (HCC)    mild COPD. former smoker   Coronary artery disease    a. 2003 s/p PCI ([facility]); b. 2004 s/p PCI x 2 ([facility]); c. 11/2017 Cath: LM nl, LAD mild dzs, LCX patent stent w/ 30% distal edge restenosis, RCA patent distal stent w/ 30% prox edge stenosis. Nl EF->Med Rx.   Diverticulosis    Fundic gland polyps of stomach, benign    Gastritis    GERD (gastroesophageal reflux disease)    Hypercholesteremia    Hypertension    Prostate cancer ([place]    Prostatism    Reflux esophagitis    Renal stones    Skin cancer    left cheek/lesion excised   Sleep apnea    Tachycardia    a. 11/2017 admit w/ tachycardia/? afib-->no afib noted upon review (amio/OAC d/c'd).   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[place] 12/30/2018   Abdominal aortic aneurysm (AAA) 12/07/2018   Anemia 12/07/2018   Right hip pain 11/22/2018   Atrial fibrillation, new onset ([place] 11/27/2017  Dizziness 08/12/2017   Nasal erythema 04/10/2017   Carotid artery disease ([place]) 08/06/2016   History of TIA (transient ischemic attack) 07/24/2016   Sleep apnea 07/24/2016   COPD (chronic obstructive pulmonary disease) ([place]) 07/24/2016   CKD (chronic kidney disease) 07/24/2016   Barrett's esophagus 07/24/2016   Hyperglycemia 07/24/2016   Hypercholesterolemia 07/12/2016   Hypertension, essential 07/12/2016   CAD (coronary artery disease) 07/12/2016   Prostate cancer ([place]) 11/15/2015   Family history of cancer 11/15/2015   Goals of care, counseling/discussion 06/18/2014   Acute rhinitis 02/12/2014   S/P coronary artery  stent placement 10/02/2013   History of skin cancer 09/02/2013    Past Surgical History:  Procedure Laterality Date   CARDIAC CATHETERIZATION     COLONOSCOPY WITH PROPOFOL N/A 09/13/2015   Procedure: COLONOSCOPY WITH PROPOFOL;  Surgeon: Lollie Sails, MD;  Location: [facility] ENDOSCOPY;  Service: Endoscopy;  Laterality: N/A;   COLONOSCOPY WITH PROPOFOL N/A 05/07/2019   Procedure: COLONOSCOPY WITH PROPOFOL;  Surgeon: Robert Bellow, MD;  Location: [facility] ENDOSCOPY;  Service: Endoscopy;  Laterality: N/A;   CORONARY ANGIOPLASTY     ESOPHAGOGASTRODUODENOSCOPY (EGD) WITH PROPOFOL N/A 09/13/2015   Procedure: ESOPHAGOGASTRODUODENOSCOPY (EGD) WITH PROPOFOL;  Surgeon: Lollie Sails, MD;  Location: [facility] ENDOSCOPY;  Service: Endoscopy;  Laterality: N/A;   ESOPHAGOGASTRODUODENOSCOPY (EGD) WITH PROPOFOL N/A 12/28/2015   Procedure: ESOPHAGOGASTRODUODENOSCOPY (EGD) WITH PROPOFOL;  Surgeon: Lollie Sails, MD;  Location: [facility] ENDOSCOPY;  Service: Endoscopy;  Laterality: N/A;   ESOPHAGOGASTRODUODENOSCOPY (EGD) WITH PROPOFOL N/A 06/16/2016   Procedure: ESOPHAGOGASTRODUODENOSCOPY (EGD) WITH PROPOFOL;  Surgeon: Lollie Sails, MD;  Location: [facility] ENDOSCOPY;  Service: Endoscopy;  Laterality: N/A;   ESOPHAGOGASTRODUODENOSCOPY (EGD) WITH PROPOFOL N/A 05/07/2019   Procedure: ESOPHAGOGASTRODUODENOSCOPY (EGD) WITH PROPOFOL;  Surgeon: Robert Bellow, MD;  Location: [facility] ENDOSCOPY;  Service: Endoscopy;  Laterality: N/A;   EYE SURGERY  2013   CATARACT EXTRACTION   GANGLION CYST EXCISION Right 05/18/2017   Procedure: REMOVAL GANGLION CYST ANKLE;  Surgeon: Samara Deist, DPM;  Location: [facility] ORS;  Service: Podiatry;  Laterality: Right;   heart cath stent     LEFT HEART CATH AND CORONARY ANGIOGRAPHY Right 12/03/2017   Procedure: Left Heart Cath and Coronary Angiography with possible coronary intervention;  Surgeon: Dionisio David, MD;  Location: [place] CV LAB;  Service: Cardiovascular;  Laterality: Right;    PROSTATE BIOPSY     stents     multiple    Prior to Admission medications   Medication Sig Start Date End Date Taking? Authorizing Provider  amLODipine (NORVASC) 10 MG tablet TAKE 1 TABLET BY MOUTH ONCE DAILY 11/09/20   Einar Pheasant, MD  aspirin EC 81 MG tablet Take 81 mg by mouth daily.    [provider]  azelastine (ASTELIN) 0.1 % nasal spray Place 1 spray into both nostrils as needed for rhinitis. Use in each nostril as directed    [provider]  calcium citrate-vitamin D (CITRACAL+D) 315-200 MG-UNIT tablet Take 1 tablet by mouth daily.    [provider]  cholecalciferol (VITAMIN D) 1000 units tablet Take 1,000 Units by mouth daily.     [provider]  clopidogrel (PLAVIX) 75 MG tablet TAKE 1 TABLET BY MOUTH ONCE DAILY 11/09/20   Theora Gianotti, NP  ferrous sulfate 325 (65 FE) MG tablet Take by mouth.    [provider]  fexofenadine (ALLEGRA) 180 MG tablet Take 180 mg by mouth daily as needed for allergies or rhinitis.     [provider]  fluticasone (FLONASE) 50 MCG/ACT nasal spray Place 2 sprays into both nostrils daily.     [provider]  isosorbide mononitrate (IMDUR) 30 MG 24 hr tablet TAKE 1 TABLET BY MOUTH ONCE DAILY 10/12/20   Einar Pheasant, MD  losartan (COZAAR) 50 MG tablet Take 1 tablet (50 mg total) by mouth daily. 11/22/18   Einar Pheasant, MD  lovastatin (MEVACOR) 40 MG tablet TAKE 2 TABLETS BY MOUTH AT BEDTIME 07/13/20   Einar Pheasant, MD  Multiple Vitamin (MULTI-VITAMINS) TABS Take 1 tablet by mouth daily.     [provider]  pantoprazole (PROTONIX) 40 MG tablet Take 40 mg by mouth 2 (two) times daily.     [provider]    Allergies Atorvastatin  Family History  Problem Relation Age of Onset   Cancer Mother        gastric and lung   Cancer Father        multiple myeloma   Stroke Father    Cancer Sister        leukemia   Cancer Brother        leukemia    Cancer Brother        kidney   Cancer Daughter        Uterine   Cancer Other        [place] (Sister's Son): Prostate    Social History Social History   Tobacco Use   Smoking status: Former    Packs/day: 1.00    Years: 35.00    Pack years: 35.00    Types: Cigarettes    Quit date: 03/06/1986    Years since quitting: 34.8   Smokeless tobacco: Former    Types: Nurse, children's Use: Never used  Substance Use Topics   Alcohol use: No   Drug use: No    Review of Systems  Constitutional: No fever/chills Eyes: No visual changes. ENT: No sore throat. Cardiovascular: Denies chest pain.  Positive for dizziness and lightheadedness. Respiratory: Denies shortness of breath. Gastrointestinal: No abdominal pain.  No nausea, no vomiting.  No diarrhea.  No constipation. Genitourinary: Negative for dysuria. Musculoskeletal: Negative for back pain. Skin: Negative for rash. Neurological: Negative for headaches, focal weakness or numbness.  Positive for tingling.  ____________________________________________   PHYSICAL EXAM:  VITAL SIGNS: ED Triage Vitals  Enc Vitals Group     BP 01/12/21 1026 (!) 133/57     Pulse Rate 01/12/21 1026 62     Resp 01/12/21 1025 18     Temp 01/12/21 1025 (!) 97.4 F (36.3 C)     Temp Source 01/12/21 1025 Oral     SpO2 01/12/21 1026 100 %     Weight 01/12/21 1025 222 lb (100.7 kg)     Height 01/12/21 1025 5' 10" (1.778 m)     Head Circumference --      Peak Flow --      Pain Score 01/12/21 1025 0     Pain Loc --      Pain Edu? --      Excl. in [place]? --     Constitutional: Alert and oriented. Eyes: Conjunctivae are normal. Head: Atraumatic. Nose: No congestion/rhinnorhea. Mouth/Throat: Mucous membranes are moist. Neck: Normal ROM Cardiovascular: Normal rate, regular rhythm. Grossly normal heart sounds.  2+ radial pulses bilaterally. Respiratory: Normal respiratory effort.  No retractions. Lungs CTAB. Gastrointestinal: Soft and  nontender. No distention. Genitourinary: deferred Musculoskeletal: No lower extremity tenderness nor edema. Neurologic:  Normal speech and language. No gross focal neurologic deficits are appreciated. Skin:  Skin is warm, dry and intact. No rash noted. Psychiatric: Mood and affect are normal. Speech and behavior are normal.  ____________________________________________   LABS (all labs ordered are listed, but only abnormal results are displayed)  Labs Reviewed  BASIC METABOLIC PANEL - Abnormal; Notable for the following components:      Result Value   Glucose, Bld 100 (*)    All other components within normal limits  CBC - Abnormal; Notable for the following components:   RBC 4.08 (*)    Hemoglobin 12.5 (*)    HCT 36.7 (*)    All other components within normal limits  URINALYSIS, ROUTINE W REFLEX MICROSCOPIC - Abnormal; Notable for the following components:   Color, Urine STRAW (*)    APPearance CLEAR (*)    All other components within normal limits  TROPONIN I (HIGH SENSITIVITY)   ____________________________________________  EKG  ED ECG REPORT I, Blake Divine, the attending physician, personally viewed and interpreted this ECG.   Date: 01/12/2021  EKG Time: 10:26  Rate: 58  Rhythm: sinus bradycardia  Axis: Normal  Intervals:none  ST&T Change: None   PROCEDURES  Procedure(s) performed (including Critical Care):  Procedures   ____________________________________________   INITIAL IMPRESSION / ASSESSMENT AND PLAN / ED COURSE      85 year old male with past medical history of hypertension, hyperlipidemia, CAD, CHF, atrial fibrillation, CKD, COPD, and AAA who presents to the ED for intermittent episodes of dizziness over the past couple of months that have been slightly different over the past 24 to 36 hours with episodes of tingling around his head and both feet today.  He has no focal neurologic deficits on exam and given he describes dizzy episodes as a  lightheadedness rather than the room spinning around him, suspicion for stroke is low.  But given his dizziness with vague neurologic symptoms, we will check CT head.  EKG shows no evidence of arrhythmia or ischemia, we will screen labs for anemia, electrode abnormality, or elevation in troponin.  Low suspicion for cardiac etiology for his symptoms.  Labs are unremarkable, hemoglobin stable from previous and no significant electrolyte abnormality.  Troponin is within normal limits and I doubt cardiac etiology for his symptoms.  CT head is negative for acute process and I doubt stroke given his nonfocal neurologic exam with symptoms more consistent with paresthesias.  He is appropriate for discharge home with PCP follow-up, was counseled to drink plenty of fluids and to return to the ED for new worsening symptoms.  Patient agrees with plan.      ____________________________________________   FINAL CLINICAL IMPRESSION(S) / ED DIAGNOSES  Final diagnoses:  Dizziness  Lightheadedness  Paresthesia     ED Discharge Orders     None        Note:  This document was prepared using Dragon voice recognition software and may include unintentional dictation errors.    Blake Divine, MD 01/12/21 1308

## 2021-01-12 NOTE — ED Notes (Signed)
Patient provided with urinal to obtain urine sample.

## 2021-01-12 NOTE — ED Triage Notes (Signed)
Pt sent to the ED by Dr. Nicki Reaper for dizziness.  Pt has h/o a-fib, CKD, CHF.  Pt not in a-fib today and orthostatics performed in their office with no obvious change.  Pt states his dizziness feels different than his baseline.  Pt does have a h/o small stroke in the past.  Pt currently in NAD with even and unlabored respirations.

## 2021-01-12 NOTE — Assessment & Plan Note (Signed)
Has been evaluated previously for dizziness by cardiology and ENT.  Also has seen neurology.  The episodes he has described occurring in the last 24-36 hours have been different.  States his head does not feel right.  Describes a tingling sensation in his head.  Is more unsteady.  Not orthostatic on exam.  In SR.  No increased heart rate or palpitations.  Has a history of CVA.  Discussed concern given the change in symptoms, that he needs further evaluation including heart monitor, scan, etc.  Discussed ER evaluation.  He is agreeable.  Pt wife to transport him to ER.  Declined ambulance.

## 2021-01-12 NOTE — Telephone Encounter (Signed)
FYI for you-  Called to confirm patient is doing ok. Advised Dr Nicki Reaper is out of the office but did let wife know that we received records from ED and thanked her for the call to update. Patient will call if they need anything.

## 2021-01-12 NOTE — Telephone Encounter (Signed)
Patient's wife called to let Dr Nicki Reaper know that  all the labs and CT scan are good.

## 2021-01-14 ENCOUNTER — Encounter: Payer: Self-pay | Admitting: Internal Medicine

## 2021-01-17 ENCOUNTER — Encounter: Payer: Self-pay | Admitting: Internal Medicine

## 2021-01-17 ENCOUNTER — Other Ambulatory Visit: Payer: Self-pay | Admitting: Internal Medicine

## 2021-01-17 NOTE — Assessment & Plan Note (Signed)
Breathing stable.

## 2021-01-17 NOTE — Assessment & Plan Note (Signed)
No chest pain.  Breathing stable.  W/up for dizziness planned as outlined.

## 2021-01-17 NOTE — Assessment & Plan Note (Signed)
Followed by GI.  EGD 05/2019.  Continue PPI.

## 2021-01-17 NOTE — Assessment & Plan Note (Signed)
Continue cpap.  

## 2021-01-17 NOTE — Assessment & Plan Note (Signed)
Followed by [facility].  Evaluated 01/2020.  Continues to follow.

## 2021-01-17 NOTE — Assessment & Plan Note (Signed)
Continue protonix  

## 2021-01-17 NOTE — Assessment & Plan Note (Signed)
Blood pressure as outlined.  On imdur, amlodipine and losartan. Not orthostatic on exam. Discussed lowering amlodipine to 1/2 tablet q day - to see if helped with light headedness.  Continue to follow blood pressure.  Continue to monitor metabolic panel.

## 2021-01-17 NOTE — Assessment & Plan Note (Signed)
Saw [facility] 01/2020.  Stable.  Recommended f/u in one year.

## 2021-01-17 NOTE — Assessment & Plan Note (Signed)
Continue lovastatin.  Follow lipid panel and liver function tests.   

## 2021-01-17 NOTE — Assessment & Plan Note (Signed)
Follow met b and a1c.  

## 2021-01-17 NOTE — Assessment & Plan Note (Signed)
Evaluated by [facility].  Evaluated 01/2020.  Stable.  Recommended f/u scanning in 2 years.  

## 2021-01-17 NOTE — Assessment & Plan Note (Signed)
Remains on aspirin and plavix.  States has not missed medication.  Given his continued light headedness and head not feeling right with unsteadiness, will send over to ER for further evaluation.  ER notified.

## 2021-01-17 NOTE — Assessment & Plan Note (Signed)
Followed by cardiology.  ECHO - 05/2019 - EF 55-60%.  Continues on losartan, imdur and amlodipine.  Blood pressure doing well.  Stable.  

## 2021-01-17 NOTE — Assessment & Plan Note (Signed)
Continue f/u with hematology.  Will need cbc checked to confirm stable.  Labs to be done in ER.

## 2021-01-20 ENCOUNTER — Other Ambulatory Visit: Payer: Self-pay

## 2021-01-20 MED ORDER — LOSARTAN POTASSIUM 50 MG PO TABS: 50.0000 mg | ORAL_TABLET | Freq: Every day | ORAL | 1 refills | Status: DC

## 2021-01-20 NOTE — Telephone Encounter (Signed)
*  STAT* If patient is at the pharmacy, call can be transferred to refill team.   1. Which medications need to be refilled? (please list name of each medication and dose if known) Losartan  2. Which pharmacy/location (including street and city if local pharmacy) is medication to be sent to? [facility]  3. Do they need a 30 day or 90 day supply? [place]

## 2021-01-21 ENCOUNTER — Ambulatory Visit (INDEPENDENT_AMBULATORY_CARE_PROVIDER_SITE_OTHER): Payer: Medicare HMO

## 2021-01-21 ENCOUNTER — Ambulatory Visit (INDEPENDENT_AMBULATORY_CARE_PROVIDER_SITE_OTHER): Payer: Medicare HMO | Admitting: Nurse Practitioner

## 2021-01-21 ENCOUNTER — Other Ambulatory Visit: Payer: Self-pay

## 2021-01-21 VITALS — BP 110/65 | HR 73 | Ht 70.0 in | Wt 223.0 lb

## 2021-01-21 DIAGNOSIS — I714 Abdominal aortic aneurysm, without rupture, unspecified: Secondary | ICD-10-CM

## 2021-01-21 DIAGNOSIS — I779 Disorder of arteries and arterioles, unspecified: Secondary | ICD-10-CM

## 2021-01-21 DIAGNOSIS — I10 Essential (primary) hypertension: Secondary | ICD-10-CM | POA: Diagnosis not present

## 2021-01-24 ENCOUNTER — Encounter (INDEPENDENT_AMBULATORY_CARE_PROVIDER_SITE_OTHER): Payer: Self-pay | Admitting: Nurse Practitioner

## 2021-01-24 NOTE — Progress Notes (Signed)
Subjective:    Patient ID: Nathaniel Thompson, male    DOB: [birth year], 85 y.o.   MRN: [id number] Chief Complaint  Patient presents with   Follow-up    1 yr Carotid U/S     Maks Cavallero is an 85 year old male that presents to the office for evaluation of an abdominal aortic aneurysm. The aneurysm was found incidentally by plain films for low back pain. Patient denies abdominal pain or unusual back pain, no other abdominal complaints.  He does have a bony met from his prostate cancer at L3.  No history of an acute onset of painful blue discoloration of the toes.      No family history of AAA.    Patient denies amaurosis fugax or TIA symptoms. There is no history of claudication or rest pain symptoms of the lower extremities.  He does have pain in the right leg when he walks but it radiates from posterior to laterally down the leg and occurs with laying down in bed tooThe patient denies angina or shortness of breath.   Duplex ultrasound of the abdominal aorta obtained today demonstrates the infrarenal abdominal aorta is 2.7 cm.  The previous aortic duplex ultrasound dated 01/24/2019 demonstrated a infrarenal abdominal aortic aneurysm with a maximal diameter of 3.3 cm   Carotid duplex is 1 to 39% bilaterally.  There is no significant change on today's study compared to previous.      Review of Systems  Cardiovascular:  Negative for leg swelling.  Gastrointestinal:  Negative for abdominal pain.  Musculoskeletal:  Positive for back pain.  All other systems reviewed and are negative.     Objective:   Physical Exam Vitals reviewed.  Neck:     Vascular: No carotid bruit.  Cardiovascular:     Rate and Rhythm: Normal rate.     Pulses: Normal pulses.  Pulmonary:     Effort: Pulmonary effort is normal.  Skin:    General: Skin is warm and dry.  Neurological:     Mental Status: He is alert and oriented to person, place, and time.     Gait: Gait abnormal.  Psychiatric:        Mood and  Affect: Mood normal.        Behavior: Behavior normal.        Thought Content: Thought content normal.        Judgment: Judgment normal.    BP 110/65   Pulse 73   Ht 5' 10" (1.778 m)   Wt 223 lb (101.2 kg)   BMI 32.00 kg/m   Past Medical History:  Diagnosis Date   (HFpEF) heart failure with preserved ejection fraction ([place])    a. 05/2018 Echo: Nl EF, Gr1 DD, sev dil LA, mild MR/TR, mild PAH; b. 05/2019 Echo: EF 55-60%, no rwma, mild LVH. Nl PASP. Mildly dil LA. Triv MR. Mild AoV sclerosis w/o stenosis. Mildly dil Asc AO (30m).   Allergic rhinitis    Barrett's esophagus 10/21/13   Bone cancer (HCC)    CKD (chronic kidney disease), stage III (HCC)    Colon polyp, hyperplastic    COPD (chronic obstructive pulmonary disease) (HCC)    mild COPD. former smoker   Coronary artery disease    a. 2003 s/p PCI ([facility]); b. 2004 s/p PCI x 2 ([facility]); c. 11/2017 Cath: LM nl, LAD mild dzs, LCX patent stent w/ 30% distal edge restenosis, RCA patent distal stent w/ 30% prox edge stenosis. Nl EF->Med Rx.   Diverticulosis  Fundic gland polyps of stomach, benign    Gastritis    GERD (gastroesophageal reflux disease)    Hypercholesteremia    Hypertension    Prostate cancer (HCC)    Prostatism    Reflux esophagitis    Renal stones    Skin cancer    left cheek/lesion excised   Sleep apnea    Tachycardia    a. 11/2017 admit w/ tachycardia/? afib-->no afib noted upon review (amio/OAC d/c'd).   TIA (transient ischemic attack)     Social History   Socioeconomic History   Marital status: Married    Spouse name: Not on file   Number of children: Not on file   Years of education: Not on file   Highest education level: Not on file  Occupational History   Not on file  Tobacco Use   Smoking status: Former    Packs/day: 1.00    Years: 35.00    Pack years: 35.00    Types: Cigarettes    Quit date: 03/06/1986    Years since quitting: 34.9   Smokeless tobacco: Former    Types: Chew  Vaping Use    Vaping Use: Never used  Substance and Sexual Activity   Alcohol use: No   Drug use: No   Sexual activity: Not Currently  Other Topics Concern   Not on file  Social History Narrative   Not on file   Social Determinants of Health   Financial Resource Strain: Low Risk    Difficulty of Paying Living Expenses: Not hard at all  Food Insecurity: No Food Insecurity   Worried About Running Out of Food in the Last Year: Never true   Ran Out of Food in the Last Year: Never true  Transportation Needs: No Transportation Needs   Lack of Transportation (Medical): No   Lack of Transportation (Non-Medical): No  Physical Activity: Unknown   Days of Exercise per Week: 0 days   Minutes of Exercise per Session: Not on file  Stress: No Stress Concern Present   Feeling of Stress : Not at all  Social Connections: Unknown   Frequency of Communication with Friends and Family: Not on file   Frequency of Social Gatherings with Friends and Family: Not on file   Attends Religious Services: Not on file   Active Member of Clubs or Organizations: Not on file   Attends Club or Organization Meetings: Not on file   Marital Status: Married  Intimate Partner Violence: Not At Risk   Fear of Current or Ex-Partner: No   Emotionally Abused: No   Physically Abused: No   Sexually Abused: No    Past Surgical History:  Procedure Laterality Date   CARDIAC CATHETERIZATION     COLONOSCOPY WITH PROPOFOL N/A 09/13/2015   Procedure: COLONOSCOPY WITH PROPOFOL;  Surgeon: Martin U Skulskie, MD;  Location: [facility];  Service: Endoscopy;  Laterality: N/A;   COLONOSCOPY WITH PROPOFOL N/A 05/07/2019   Procedure: COLONOSCOPY WITH PROPOFOL;  Surgeon: Byrnett, Jeffrey W, MD;  Location: [facility];  Service: Endoscopy;  Laterality: N/A;   CORONARY ANGIOPLASTY     ESOPHAGOGASTRODUODENOSCOPY (EGD) WITH PROPOFOL N/A 09/13/2015   Procedure: ESOPHAGOGASTRODUODENOSCOPY (EGD) WITH PROPOFOL;  Surgeon: Martin U Skulskie, MD;   Location: [facility];  Service: Endoscopy;  Laterality: N/A;   ESOPHAGOGASTRODUODENOSCOPY (EGD) WITH PROPOFOL N/A 12/28/2015   Procedure: ESOPHAGOGASTRODUODENOSCOPY (EGD) WITH PROPOFOL;  Surgeon: Martin U Skulskie, MD;  Location: [facility];  Service: Endoscopy;  Laterality: N/A;   ESOPHAGOGASTRODUODENOSCOPY (EGD) WITH PROPOFOL N/A 06/16/2016     Procedure: ESOPHAGOGASTRODUODENOSCOPY (EGD) WITH PROPOFOL;  Surgeon: Martin U Skulskie, MD;  Location: [facility];  Service: Endoscopy;  Laterality: N/A;   ESOPHAGOGASTRODUODENOSCOPY (EGD) WITH PROPOFOL N/A 05/07/2019   Procedure: ESOPHAGOGASTRODUODENOSCOPY (EGD) WITH PROPOFOL;  Surgeon: Byrnett, Jeffrey W, MD;  Location: [facility];  Service: Endoscopy;  Laterality: N/A;   EYE SURGERY  2013   CATARACT EXTRACTION   GANGLION CYST EXCISION Right 05/18/2017   Procedure: REMOVAL GANGLION CYST ANKLE;  Surgeon: Fowler, Justin, DPM;  Location: [facility];  Service: Podiatry;  Laterality: Right;   heart cath stent     LEFT HEART CATH AND CORONARY ANGIOGRAPHY Right 12/03/2017   Procedure: Left Heart Cath and Coronary Angiography with possible coronary intervention;  Surgeon: Khan, Shaukat A, MD;  Location: [facility];  Service: Cardiovascular;  Laterality: Right;   PROSTATE BIOPSY     stents     multiple    Family History  Problem Relation Age of Onset   Cancer Mother        gastric and lung   Cancer Father        multiple myeloma   Stroke Father    Cancer Sister        leukemia   Cancer Brother        leukemia   Cancer Brother        kidney   Cancer Daughter        Uterine   Cancer Other        Nephes (Sister's Son): Prostate    Allergies  Allergen Reactions   Atorvastatin Other (See Comments)    Achy joints    CBC Latest Ref Rng & Units 01/12/2021 11/19/2020 08/18/2020  WBC 4.0 - 10.5 K/uL 6.0 5.1 4.8  Hemoglobin 13.0 - 17.0 g/dL 12.5(L) 12.3(L) 12.3(L)  Hematocrit 39.0 - 52.0 % 36.7(L) 36.2(L) 35.9(L)  Platelets 150 -  400 K/uL 199 186 183      CMP     Component Value Date/Time   NA 138 01/12/2021 1027   NA 140 09/16/2013 0438   K 3.9 01/12/2021 1027   K 4.2 09/16/2013 0438   CL 107 01/12/2021 1027   CL 109 (H) 09/16/2013 0438   CO2 23 01/12/2021 1027   CO2 24 09/16/2013 0438   GLUCOSE 100 (H) 01/12/2021 1027   GLUCOSE 101 (H) 09/16/2013 0438   BUN 17 01/12/2021 1027   BUN 20 (H) 09/16/2013 0438   CREATININE 1.11 01/12/2021 1027   CREATININE 1.28 09/16/2013 0438   CALCIUM 9.4 01/12/2021 1027   CALCIUM 8.3 (L) 09/16/2013 0438   PROT 6.8 11/19/2020 1259   PROT 6.7 09/15/2013 0755   ALBUMIN 3.8 11/19/2020 1259   ALBUMIN 3.5 09/15/2013 0755   AST 21 11/19/2020 1259   AST 37 09/15/2013 0755   ALT 15 11/19/2020 1259   ALT 28 09/15/2013 0755   ALKPHOS 53 11/19/2020 1259   ALKPHOS 44 (L) 09/15/2013 0755   BILITOT 0.6 11/19/2020 1259   BILITOT 0.5 09/15/2013 0755   GFRNONAA >60 01/12/2021 1027   GFRNONAA 54 (L) 09/16/2013 0438   GFRAA 59 (L) 10/07/2019 1305   GFRAA >60 09/16/2013 0438     No results found.     Assessment & Plan:   1. Carotid artery disease, unspecified laterality, unspecified type (HCC) The patient is seen for follow up evaluation of carotid stenosis. The carotid stenosis followed by ultrasound.   The patient denies amaurosis fugax. There is no recent history of TIA symptoms or focal motor deficits.   There is no prior documented CVA.  The patient is taking enteric-coated aspirin 81 mg daily.  There is no history of migraine headaches. There is no history of seizures.  The patient has a history of coronary artery disease, no recent episodes of angina or shortness of breath. The patient denies PAD or claudication symptoms. There is a history of hyperlipidemia which is being treated with a statin.    Carotid Duplex done today shows 1 to 39% stenosis bilaterally.  No change compared to last study in 01/22/2020  2. Abdominal aortic aneurysm (AAA) without rupture,  unspecified part No surgery or intervention at this time. The patient has an asymptomatic abdominal aortic aneurysm that is less than 4 cm in maximal diameter.  I have discussed the natural history of abdominal aortic aneurysm and the small risk of rupture for aneurysm less than 5 cm in size.  However, as these small aneurysms tend to enlarge over time, continued surveillance with ultrasound or CT scan is mandatory.  I have also discussed optimizing medical management with hypertension and lipid control and the importance of abstinence from tobacco.  The patient is also encouraged to exercise a minimum of 30 minutes 4 times a week.  Should the patient develop new onset abdominal or back pain or signs of peripheral embolization they are instructed to seek medical attention immediately and to alert the physician providing care that they have an aneurysm.  The patient voices their understanding. The patient will return in 12 months with an aortic duplex.   3. Hypertension, essential Continue antihypertensive medications as already ordered, these medications have been reviewed and there are no changes at this time.    Current Outpatient Medications on File Prior to Visit  Medication Sig Dispense Refill   amLODipine (NORVASC) 10 MG tablet TAKE 1 TABLET BY MOUTH ONCE DAILY 90 tablet 1   aspirin EC 81 MG tablet Take 81 mg by mouth daily.     azelastine (ASTELIN) 0.1 % nasal spray Place 1 spray into both nostrils as needed for rhinitis. Use in each nostril as directed     calcium citrate-vitamin D (CITRACAL+D) 315-200 MG-UNIT tablet Take 1 tablet by mouth daily.     cholecalciferol (VITAMIN D) 1000 units tablet Take 1,000 Units by mouth daily.      clopidogrel (PLAVIX) 75 MG tablet TAKE 1 TABLET BY MOUTH ONCE DAILY 90 tablet 0   ferrous sulfate 325 (65 FE) MG tablet Take by mouth.     fexofenadine (ALLEGRA) 180 MG tablet Take 180 mg by mouth daily as needed for allergies or rhinitis.      fluticasone  (FLONASE) 50 MCG/ACT nasal spray Place 2 sprays into both nostrils daily.      isosorbide mononitrate (IMDUR) 30 MG 24 hr tablet TAKE 1 TABLET BY MOUTH ONCE DAILY 90 tablet 2   losartan (COZAAR) 50 MG tablet Take 1 tablet (50 mg total) by mouth daily. 90 tablet 1   lovastatin (MEVACOR) 40 MG tablet TAKE 2 TABLETS BY MOUTH AT BEDTIME 180 tablet 1   Multiple Vitamin (MULTI-VITAMINS) TABS Take 1 tablet by mouth daily.      pantoprazole (PROTONIX) 40 MG tablet Take 40 mg by mouth 2 (two) times daily.      No current facility-administered medications on file prior to visit.    There are no Patient Instructions on file for this visit. No follow-ups on file.   Fallon E Brown, NP    

## 2021-01-31 ENCOUNTER — Telehealth: Payer: Self-pay | Admitting: Cardiovascular Disease

## 2021-01-31 NOTE — Telephone Encounter (Signed)
I called and spoke with the patient's wife (ok per DPR). She advised that for the last 2 weeks, the patient will intermittently wake up/ jump up around 3 am- 4 am stating he could not breathe. He is currently on C-PAP. He has also had more feelings of indigestion which is relieved with Tums/ belching.   Per Mrs. Newsome the patient had not admitted to her to having chest pain, but had admitted this to his daughter. She discussed this with the patient on Thanksgiving day and he admitted to her at that time he has been having intermittent chest pain.  Per Mrs. Fagin, her concern is that prior to the patient's previous stenting, he was having symptoms of indigestion as well. She had told the patient she was going to call our office to report his recent symptoms of chest pain/ indigestion. She does not think he has had any further chest pain since Thursday.   I inquired from Mrs. Enberg if she felt the patient may be agreeable to an office visit for further evaluation. She advised that he would be agreeable and she would prefer this as well.  I have offered an appointment with Dr. Fletcher Anon on Friday 02/04/21 at 7:40 am and she is agreeable.  I have advised if the patient's symptoms worsen prior to that appointment that she may call us back, or if urgent, please have the patient seek evaluation in the ER. Mrs. Edelson voices understanding of the above and was very appreciative of the call back.

## 2021-01-31 NOTE — Telephone Encounter (Signed)
Patient spouse calling States that he has not been sleeping well Has had issues with bad indigestion and would like to discuss if this has anything to do with blockage Please call to discuss

## 2021-02-03 NOTE — H&P (View-Only) (Signed)
Cardiology Office Note   Date:  02/04/2021   ID:  Nathaniel Thompson, DOB 17-Aug-1935, MRN [id number]  PCP:  Einar Pheasant, MD  Cardiologist:   Kathlyn Sacramento, MD   Chief Complaint  Patient presents with   office visit-chest pain    Patient reports SOB and sharp chest pain/pressure that has been recurring for the last several weeks. He also states he has been having indigestion recently.        History of Present Illness: Nathaniel Thompson is a 85 y.o. male who presents for a follow-up visit regarding coronary artery disease.   He has known history of coronary artery disease status post stenting of the left circumflex and RCA.  He had initial PCI done in 2003 at [facility].  He had repeat PCI x2 at [facility] in 2004.  Most recent cardiac catheterization in 2019 was personally reviewed by me and showed patent left circumflex stent with 30% distal edge restenosis, mild LAD disease, patent distal RCA stent with 30% proximal stenosis.  Ejection fraction was normal. He quit smoking in the 80s.  He reports having TIA in the past.  Other medical problems include metastatic prostate cancer, small abdominal aortic aneurysm followed by vascular surgery, hyperlipidemia and essential hypertension. He was hospitalized in September 2019 with questionable tachycardia and atrial fibrillation.  Initially was placed on amiodarone and anticoagulation but all his EKGs revealed sinus rhythm at that time and thus both amiodarone and Eliquis were discontinued.  No recurrent arrhythmia since then.    Echocardiogram in March 2020 showed normal LV systolic function with grade 1 diastolic dysfunction, severely dilated left atrium, mild mitral and tricuspid regurgitation and mild pulmonary hypertension.  He went to the emergency room few weeks ago with dizziness.  His labs were unremarkable.  CT head was negative for acute process.  Over the last 2 weeks, he started having intermittent episodes of substernal chest pain  and tightness with increased heartburn.  This happens with exertion and also at rest at night.  He noticed increased shortness of breath and orthopnea.  He wakes up in the middle of the night with shortness of breath followed by chest pain.  In addition, he had weight gain and increased lower extremity edema.  Past Medical History:  Diagnosis Date   (HFpEF) heart failure with preserved ejection fraction ([place])    a. 05/2018 Echo: Nl EF, Gr1 DD, sev dil LA, mild MR/TR, mild PAH; b. 05/2019 Echo: EF 55-60%, no rwma, mild LVH. Nl PASP. Mildly dil LA. Triv MR. Mild AoV sclerosis w/o stenosis. Mildly dil Asc AO (48mm).   Allergic rhinitis    Barrett's esophagus 10/21/13   Bone cancer (HCC)    CKD (chronic kidney disease), stage III (HCC)    Colon polyp, hyperplastic    COPD (chronic obstructive pulmonary disease) (HCC)    mild COPD. former smoker   Coronary artery disease    a. 2003 s/p PCI ([facility]); b. 2004 s/p PCI x 2 ([facility]); c. 11/2017 Cath: LM nl, LAD mild dzs, LCX patent stent w/ 30% distal edge restenosis, RCA patent distal stent w/ 30% prox edge stenosis. Nl EF->Med Rx.   Diverticulosis    Fundic gland polyps of stomach, benign    Gastritis    GERD (gastroesophageal reflux disease)    Hypercholesteremia    Hypertension    Prostate cancer ([place])    Prostatism    Reflux esophagitis    Renal stones    Skin cancer  left cheek/lesion excised   Sleep apnea    Tachycardia    a. 11/2017 admit w/ tachycardia/? afib-->no afib noted upon review (amio/OAC d/c'd).   TIA (transient ischemic attack)     Past Surgical History:  Procedure Laterality Date   CARDIAC CATHETERIZATION     COLONOSCOPY WITH PROPOFOL N/A 09/13/2015   Procedure: COLONOSCOPY WITH PROPOFOL;  Surgeon: Lollie Sails, MD;  Location: [facility] ENDOSCOPY;  Service: Endoscopy;  Laterality: N/A;   COLONOSCOPY WITH PROPOFOL N/A 05/07/2019   Procedure: COLONOSCOPY WITH PROPOFOL;  Surgeon: Robert Bellow, MD;  Location: [facility] ENDOSCOPY;   Service: Endoscopy;  Laterality: N/A;   CORONARY ANGIOPLASTY     ESOPHAGOGASTRODUODENOSCOPY (EGD) WITH PROPOFOL N/A 09/13/2015   Procedure: ESOPHAGOGASTRODUODENOSCOPY (EGD) WITH PROPOFOL;  Surgeon: Lollie Sails, MD;  Location: [facility] ENDOSCOPY;  Service: Endoscopy;  Laterality: N/A;   ESOPHAGOGASTRODUODENOSCOPY (EGD) WITH PROPOFOL N/A 12/28/2015   Procedure: ESOPHAGOGASTRODUODENOSCOPY (EGD) WITH PROPOFOL;  Surgeon: Lollie Sails, MD;  Location: [facility] ENDOSCOPY;  Service: Endoscopy;  Laterality: N/A;   ESOPHAGOGASTRODUODENOSCOPY (EGD) WITH PROPOFOL N/A 06/16/2016   Procedure: ESOPHAGOGASTRODUODENOSCOPY (EGD) WITH PROPOFOL;  Surgeon: Lollie Sails, MD;  Location: [facility] ENDOSCOPY;  Service: Endoscopy;  Laterality: N/A;   ESOPHAGOGASTRODUODENOSCOPY (EGD) WITH PROPOFOL N/A 05/07/2019   Procedure: ESOPHAGOGASTRODUODENOSCOPY (EGD) WITH PROPOFOL;  Surgeon: Robert Bellow, MD;  Location: [facility] ENDOSCOPY;  Service: Endoscopy;  Laterality: N/A;   EYE SURGERY  2013   CATARACT EXTRACTION   GANGLION CYST EXCISION Right 05/18/2017   Procedure: REMOVAL GANGLION CYST ANKLE;  Surgeon: Samara Deist, DPM;  Location: [facility] ORS;  Service: Podiatry;  Laterality: Right;   heart cath stent     LEFT HEART CATH AND CORONARY ANGIOGRAPHY Right 12/03/2017   Procedure: Left Heart Cath and Coronary Angiography with possible coronary intervention;  Surgeon: Dionisio David, MD;  Location: [place] CV LAB;  Service: Cardiovascular;  Laterality: Right;   PROSTATE BIOPSY     stents     multiple     Current Outpatient Medications  Medication Sig Dispense Refill   amLODipine (NORVASC) 5 MG tablet Take 5 mg by mouth daily.     aspirin EC 81 MG tablet Take 81 mg by mouth daily.     azelastine (ASTELIN) 0.1 % nasal spray Place 1 spray into both nostrils as needed for rhinitis. Use in each nostril as directed     calcium citrate-vitamin D (CITRACAL+D) 315-200 MG-UNIT tablet Take 1 tablet by mouth daily.      cholecalciferol (VITAMIN D) 1000 units tablet Take 1,000 Units by mouth daily.      clopidogrel (PLAVIX) 75 MG tablet TAKE 1 TABLET BY MOUTH ONCE DAILY 90 tablet 0   ferrous sulfate 325 (65 FE) MG tablet Take 325 mg by mouth daily with breakfast.     fexofenadine (ALLEGRA) 180 MG tablet Take 180 mg by mouth daily as needed for allergies or rhinitis.      fluticasone (FLONASE) 50 MCG/ACT nasal spray Place 2 sprays into both nostrils daily.      isosorbide mononitrate (IMDUR) 30 MG 24 hr tablet TAKE 1 TABLET BY MOUTH ONCE DAILY 90 tablet 2   losartan (COZAAR) 50 MG tablet Take 1 tablet (50 mg total) by mouth daily. 90 tablet 1   lovastatin (MEVACOR) 40 MG tablet TAKE 2 TABLETS BY MOUTH AT BEDTIME 180 tablet 1   Multiple Vitamin (MULTI-VITAMINS) TABS Take 1 tablet by mouth daily.      pantoprazole (PROTONIX) 40 MG tablet Take 40 mg by  mouth 2 (two) times daily.      No current facility-administered medications for this visit.    Allergies:   Atorvastatin    Social History:  The patient  reports that he quit smoking about 34 years ago. His smoking use included cigarettes. He has a 35.00 pack-year smoking history. He has quit using smokeless tobacco.  His smokeless tobacco use included chew. He reports that he does not drink alcohol and does not use drugs.   Family History:  The patient's family history includes Cancer in his brother, brother, daughter, father, mother, sister, and another family member; Stroke in his father.    ROS:  Please see the history of present illness.   Otherwise, review of systems are positive for none.   All other systems are reviewed and negative.    PHYSICAL EXAM: VS:  BP 120/70 (BP Location: Left Arm, Patient Position: Sitting, Cuff Size: Large)   Pulse 65   Ht 5\' 10"  (1.778 m)   Wt 220 lb (99.8 kg)   SpO2 97%   BMI 31.57 kg/m  , BMI Body mass index is 31.57 kg/m. GEN: Well nourished, well developed, in no acute distress  HEENT: normal  Neck: no JVD,  carotid bruits, or masses Cardiac: RRR; no murmurs, rubs, or gallops,no edema  Respiratory:  clear to auscultation bilaterally, normal work of breathing GI: soft, nontender, nondistended, + BS MS: no deformity or atrophy  Skin: warm and dry, no rash Neuro:  Strength and sensation are intact Psych: euthymic mood, full affect Radial pulses normal.  EKG:  EKG is ordered today. The ekg ordered today demonstrates sinus rhythm with 1 PVC.  No significant ST or T wave changes.   Recent Labs: 06/17/2020: TSH 2.15 11/19/2020: ALT 15 01/12/2021: BUN 17; Creatinine, Ser 1.11; Hemoglobin 12.5; Platelets 199; Potassium 3.9; Sodium 138    Lipid Panel    Component Value Date/Time   CHOL 128 10/25/2020 0841   CHOL 103 09/16/2013 0438   TRIG 166.0 (H) 10/25/2020 0841   TRIG 95 09/16/2013 0438   HDL 32.20 (L) 10/25/2020 0841   HDL 30 (L) 09/16/2013 0438   CHOLHDL 4 10/25/2020 0841   VLDL 33.2 10/25/2020 0841   VLDL 19 09/16/2013 0438   LDLCALC 63 10/25/2020 0841   LDLCALC 54 09/16/2013 0438      Wt Readings from Last 3 Encounters:  02/04/21 220 lb (99.8 kg)  01/21/21 223 lb (101.2 kg)  01/12/21 222 lb (100.7 kg)       PAD Screen 04/15/2019  Previous PAD dx? No  Previous surgical procedure? No  Pain with walking? No  Feet/toe relief with dangling? No  Painful, non-healing ulcers? No  Extremities discolored? No      ASSESSMENT AND PLAN:  1.  Coronary artery disease involving native coronary arteries with unstable angina: The patient presents with symptoms of chest pain and shortness of breath both with exertion and at rest highly suggestive of unstable angina and very similar to his symptoms before his previous PCI.  Due to that, I recommend proceeding with cardiac catheterization and possible PCI.  I discussed the procedure in details as well as risks and benefits.  We will schedule urgently on Monday.  He does have sublingual nitroglycerin at home.  I instructed him to emergent  care if chest pain does not respond to 1 sublingual nitroglycerin.  2.  Acute on chronic diastolic heart failure: He reports worsening exertional dyspnea, leg edema and weight gain.  We will plan on  doing a right heart catheterization at the same time with left heart catheterization.  3.  Essential hypertension: Blood pressure is controlled on current medications.    4.  Hyperlipidemia: Currently on lovastatin and Zetia.  History of intolerance to potent statins.  I reviewed most recent lipid profile showed an LDL of 63.  5.  Small abdominal aortic aneurysm: Less than 4 cm.  Followed by vascular surgery.  This has been stable.  In addition, most recent carotid Doppler showed mild nonobstructive disease.   Disposition: Proceed with urgent right and left cardiac catheterization and follow-up in 1 month.  Signed,  Kathlyn Sacramento, MD  02/04/2021 8:00 AM    [place]

## 2021-02-03 NOTE — Progress Notes (Signed)
Cardiology Office Note   Date:  02/04/2021   ID:  Nathaniel Thompson, DOB 28-Jan-1936, MRN [id number]  PCP:  Einar Pheasant, MD  Cardiologist:   Kathlyn Sacramento, MD   Chief Complaint  Patient presents with   office visit-chest pain    Patient reports SOB and sharp chest pain/pressure that has been recurring for the last several weeks. He also states he has been having indigestion recently.        History of Present Illness: Nathaniel Thompson is a 85 y.o. male who presents for a follow-up visit regarding coronary artery disease.   He has known history of coronary artery disease status post stenting of the left circumflex and RCA.  He had initial PCI done in 2003 at [facility].  He had repeat PCI x2 at [facility] in 2004.  Most recent cardiac catheterization in 2019 was personally reviewed by me and showed patent left circumflex stent with 30% distal edge restenosis, mild LAD disease, patent distal RCA stent with 30% proximal stenosis.  Ejection fraction was normal. He quit smoking in the 80s.  He reports having TIA in the past.  Other medical problems include metastatic prostate cancer, small abdominal aortic aneurysm followed by vascular surgery, hyperlipidemia and essential hypertension. He was hospitalized in September 2019 with questionable tachycardia and atrial fibrillation.  Initially was placed on amiodarone and anticoagulation but all his EKGs revealed sinus rhythm at that time and thus both amiodarone and Eliquis were discontinued.  No recurrent arrhythmia since then.    Echocardiogram in March 2020 showed normal LV systolic function with grade 1 diastolic dysfunction, severely dilated left atrium, mild mitral and tricuspid regurgitation and mild pulmonary hypertension.  He went to the emergency room few weeks ago with dizziness.  His labs were unremarkable.  CT head was negative for acute process.  Over the last 2 weeks, he started having intermittent episodes of substernal chest pain  and tightness with increased heartburn.  This happens with exertion and also at rest at night.  He noticed increased shortness of breath and orthopnea.  He wakes up in the middle of the night with shortness of breath followed by chest pain.  In addition, he had weight gain and increased lower extremity edema.  Past Medical History:  Diagnosis Date   (HFpEF) heart failure with preserved ejection fraction ([place])    a. 05/2018 Echo: Nl EF, Gr1 DD, sev dil LA, mild MR/TR, mild PAH; b. 05/2019 Echo: EF 55-60%, no rwma, mild LVH. Nl PASP. Mildly dil LA. Triv MR. Mild AoV sclerosis w/o stenosis. Mildly dil Asc AO (72mm).   Allergic rhinitis    Barrett's esophagus 10/21/13   Bone cancer (HCC)    CKD (chronic kidney disease), stage III (HCC)    Colon polyp, hyperplastic    COPD (chronic obstructive pulmonary disease) (HCC)    mild COPD. former smoker   Coronary artery disease    a. 2003 s/p PCI ([facility]); b. 2004 s/p PCI x 2 ([facility]); c. 11/2017 Cath: LM nl, LAD mild dzs, LCX patent stent w/ 30% distal edge restenosis, RCA patent distal stent w/ 30% prox edge stenosis. Nl EF->Med Rx.   Diverticulosis    Fundic gland polyps of stomach, benign    Gastritis    GERD (gastroesophageal reflux disease)    Hypercholesteremia    Hypertension    Prostate cancer ([place])    Prostatism    Reflux esophagitis    Renal stones    Skin cancer  left cheek/lesion excised   Sleep apnea    Tachycardia    a. 11/2017 admit w/ tachycardia/? afib-->no afib noted upon review (amio/OAC d/c'd).   TIA (transient ischemic attack)     Past Surgical History:  Procedure Laterality Date   CARDIAC CATHETERIZATION     COLONOSCOPY WITH PROPOFOL N/A 09/13/2015   Procedure: COLONOSCOPY WITH PROPOFOL;  Surgeon: Lollie Sails, MD;  Location: [facility] ENDOSCOPY;  Service: Endoscopy;  Laterality: N/A;   COLONOSCOPY WITH PROPOFOL N/A 05/07/2019   Procedure: COLONOSCOPY WITH PROPOFOL;  Surgeon: Robert Bellow, MD;  Location: [facility] ENDOSCOPY;   Service: Endoscopy;  Laterality: N/A;   CORONARY ANGIOPLASTY     ESOPHAGOGASTRODUODENOSCOPY (EGD) WITH PROPOFOL N/A 09/13/2015   Procedure: ESOPHAGOGASTRODUODENOSCOPY (EGD) WITH PROPOFOL;  Surgeon: Lollie Sails, MD;  Location: [facility] ENDOSCOPY;  Service: Endoscopy;  Laterality: N/A;   ESOPHAGOGASTRODUODENOSCOPY (EGD) WITH PROPOFOL N/A 12/28/2015   Procedure: ESOPHAGOGASTRODUODENOSCOPY (EGD) WITH PROPOFOL;  Surgeon: Lollie Sails, MD;  Location: [facility] ENDOSCOPY;  Service: Endoscopy;  Laterality: N/A;   ESOPHAGOGASTRODUODENOSCOPY (EGD) WITH PROPOFOL N/A 06/16/2016   Procedure: ESOPHAGOGASTRODUODENOSCOPY (EGD) WITH PROPOFOL;  Surgeon: Lollie Sails, MD;  Location: [facility] ENDOSCOPY;  Service: Endoscopy;  Laterality: N/A;   ESOPHAGOGASTRODUODENOSCOPY (EGD) WITH PROPOFOL N/A 05/07/2019   Procedure: ESOPHAGOGASTRODUODENOSCOPY (EGD) WITH PROPOFOL;  Surgeon: Robert Bellow, MD;  Location: [facility] ENDOSCOPY;  Service: Endoscopy;  Laterality: N/A;   EYE SURGERY  2013   CATARACT EXTRACTION   GANGLION CYST EXCISION Right 05/18/2017   Procedure: REMOVAL GANGLION CYST ANKLE;  Surgeon: Samara Deist, DPM;  Location: [facility] ORS;  Service: Podiatry;  Laterality: Right;   heart cath stent     LEFT HEART CATH AND CORONARY ANGIOGRAPHY Right 12/03/2017   Procedure: Left Heart Cath and Coronary Angiography with possible coronary intervention;  Surgeon: Dionisio David, MD;  Location: [place] CV LAB;  Service: Cardiovascular;  Laterality: Right;   PROSTATE BIOPSY     stents     multiple     Current Outpatient Medications  Medication Sig Dispense Refill   amLODipine (NORVASC) 5 MG tablet Take 5 mg by mouth daily.     aspirin EC 81 MG tablet Take 81 mg by mouth daily.     azelastine (ASTELIN) 0.1 % nasal spray Place 1 spray into both nostrils as needed for rhinitis. Use in each nostril as directed     calcium citrate-vitamin D (CITRACAL+D) 315-200 MG-UNIT tablet Take 1 tablet by mouth daily.      cholecalciferol (VITAMIN D) 1000 units tablet Take 1,000 Units by mouth daily.      clopidogrel (PLAVIX) 75 MG tablet TAKE 1 TABLET BY MOUTH ONCE DAILY 90 tablet 0   ferrous sulfate 325 (65 FE) MG tablet Take 325 mg by mouth daily with breakfast.     fexofenadine (ALLEGRA) 180 MG tablet Take 180 mg by mouth daily as needed for allergies or rhinitis.      fluticasone (FLONASE) 50 MCG/ACT nasal spray Place 2 sprays into both nostrils daily.      isosorbide mononitrate (IMDUR) 30 MG 24 hr tablet TAKE 1 TABLET BY MOUTH ONCE DAILY 90 tablet 2   losartan (COZAAR) 50 MG tablet Take 1 tablet (50 mg total) by mouth daily. 90 tablet 1   lovastatin (MEVACOR) 40 MG tablet TAKE 2 TABLETS BY MOUTH AT BEDTIME 180 tablet 1   Multiple Vitamin (MULTI-VITAMINS) TABS Take 1 tablet by mouth daily.      pantoprazole (PROTONIX) 40 MG tablet Take 40 mg by  mouth 2 (two) times daily.      No current facility-administered medications for this visit.    Allergies:   Atorvastatin    Social History:  The patient  reports that he quit smoking about 34 years ago. His smoking use included cigarettes. He has a 35.00 pack-year smoking history. He has quit using smokeless tobacco.  His smokeless tobacco use included chew. He reports that he does not drink alcohol and does not use drugs.   Family History:  The patient's family history includes Cancer in his brother, brother, daughter, father, mother, sister, and another family member; Stroke in his father.    ROS:  Please see the history of present illness.   Otherwise, review of systems are positive for none.   All other systems are reviewed and negative.    PHYSICAL EXAM: VS:  BP 120/70 (BP Location: Left Arm, Patient Position: Sitting, Cuff Size: Large)   Pulse 65   Ht 5\' 10"  (1.778 m)   Wt 220 lb (99.8 kg)   SpO2 97%   BMI 31.57 kg/m  , BMI Body mass index is 31.57 kg/m. GEN: Well nourished, well developed, in no acute distress  HEENT: normal  Neck: no JVD,  carotid bruits, or masses Cardiac: RRR; no murmurs, rubs, or gallops,no edema  Respiratory:  clear to auscultation bilaterally, normal work of breathing GI: soft, nontender, nondistended, + BS MS: no deformity or atrophy  Skin: warm and dry, no rash Neuro:  Strength and sensation are intact Psych: euthymic mood, full affect Radial pulses normal.  EKG:  EKG is ordered today. The ekg ordered today demonstrates sinus rhythm with 1 PVC.  No significant ST or T wave changes.   Recent Labs: 06/17/2020: TSH 2.15 11/19/2020: ALT 15 01/12/2021: BUN 17; Creatinine, Ser 1.11; Hemoglobin 12.5; Platelets 199; Potassium 3.9; Sodium 138    Lipid Panel    Component Value Date/Time   CHOL 128 10/25/2020 0841   CHOL 103 09/16/2013 0438   TRIG 166.0 (H) 10/25/2020 0841   TRIG 95 09/16/2013 0438   HDL 32.20 (L) 10/25/2020 0841   HDL 30 (L) 09/16/2013 0438   CHOLHDL 4 10/25/2020 0841   VLDL 33.2 10/25/2020 0841   VLDL 19 09/16/2013 0438   LDLCALC 63 10/25/2020 0841   LDLCALC 54 09/16/2013 0438      Wt Readings from Last 3 Encounters:  02/04/21 220 lb (99.8 kg)  01/21/21 223 lb (101.2 kg)  01/12/21 222 lb (100.7 kg)       PAD Screen 04/15/2019  Previous PAD dx? No  Previous surgical procedure? No  Pain with walking? No  Feet/toe relief with dangling? No  Painful, non-healing ulcers? No  Extremities discolored? No      ASSESSMENT AND PLAN:  1.  Coronary artery disease involving native coronary arteries with unstable angina: The patient presents with symptoms of chest pain and shortness of breath both with exertion and at rest highly suggestive of unstable angina and very similar to his symptoms before his previous PCI.  Due to that, I recommend proceeding with cardiac catheterization and possible PCI.  I discussed the procedure in details as well as risks and benefits.  We will schedule urgently on Monday.  He does have sublingual nitroglycerin at home.  I instructed him to emergent  care if chest pain does not respond to 1 sublingual nitroglycerin.  2.  Acute on chronic diastolic heart failure: He reports worsening exertional dyspnea, leg edema and weight gain.  We will plan on  doing a right heart catheterization at the same time with left heart catheterization.  3.  Essential hypertension: Blood pressure is controlled on current medications.    4.  Hyperlipidemia: Currently on lovastatin and Zetia.  History of intolerance to potent statins.  I reviewed most recent lipid profile showed an LDL of 63.  5.  Small abdominal aortic aneurysm: Less than 4 cm.  Followed by vascular surgery.  This has been stable.  In addition, most recent carotid Doppler showed mild nonobstructive disease.   Disposition: Proceed with urgent right and left cardiac catheterization and follow-up in 1 month.  Signed,  Kathlyn Sacramento, MD  02/04/2021 8:00 AM    [place]

## 2021-02-04 ENCOUNTER — Ambulatory Visit: Payer: Medicare HMO | Admitting: Cardiovascular Disease

## 2021-02-04 ENCOUNTER — Other Ambulatory Visit: Payer: Self-pay

## 2021-02-04 ENCOUNTER — Encounter: Payer: Self-pay | Admitting: Cardiovascular Disease

## 2021-02-04 VITALS — BP 120/70 | HR 65 | Ht 70.0 in | Wt 220.0 lb

## 2021-02-04 DIAGNOSIS — I714 Abdominal aortic aneurysm, without rupture, unspecified: Secondary | ICD-10-CM

## 2021-02-04 DIAGNOSIS — I5033 Acute on chronic diastolic (congestive) heart failure: Secondary | ICD-10-CM

## 2021-02-04 DIAGNOSIS — I10 Essential (primary) hypertension: Secondary | ICD-10-CM

## 2021-02-04 DIAGNOSIS — I200 Unstable angina: Secondary | ICD-10-CM | POA: Diagnosis not present

## 2021-02-04 DIAGNOSIS — E785 Hyperlipidemia, unspecified: Secondary | ICD-10-CM

## 2021-02-04 DIAGNOSIS — I25110 Atherosclerotic heart disease of native coronary artery with unstable angina pectoris: Secondary | ICD-10-CM

## 2021-02-04 DIAGNOSIS — R0602 Shortness of breath: Secondary | ICD-10-CM | POA: Diagnosis not present

## 2021-02-04 NOTE — Patient Instructions (Signed)
Medication Instructions:  Your physician recommends that you continue on your current medications as directed. Please refer to the Current Medication list given to you today.  *If you need a refill on your cardiac medications before your next appointment, please call your pharmacy*   Lab Work: Bmp and Cbc today If you have labs (blood work) drawn today and your tests are completely normal, you will receive your results only by: [place] (if you have MyChart) OR A paper copy in the mail If you have any lab test that is abnormal or we need to change your treatment, we will call you to review the results.   Testing/Procedures: Your physician has requested that you have a cardiac catheterization. Cardiac catheterization is used to diagnose and/or treat various heart conditions. Doctors may recommend this procedure for a number of different reasons. The most common reason is to evaluate chest pain. Chest pain can be a symptom of coronary artery disease (CAD), and cardiac catheterization can show whether plaque is narrowing or blocking your heart's arteries. This procedure is also used to evaluate the valves, as well as measure the blood flow and oxygen levels in different parts of your heart. For further information please visit [email or web address]. Please follow instruction sheet, as given.    Follow-Up: At [facility], you and your health needs are our priority.  As part of our continuing mission to provide you with exceptional heart care, we have created designated Provider Care Teams.  These Care Teams include your primary Cardiologist (physician) and Advanced Practice Providers (APPs -  Physician Assistants and Nurse Practitioners) who all work together to provide you with the care you need, when you need it.  We recommend signing up for the patient portal called "MyChart".  Sign up information is provided on this After Visit Summary.  MyChart is used to connect with patients for  Virtual Visits (Telemedicine).  Patients are able to view lab/test results, encounter notes, upcoming appointments, etc.  Non-urgent messages can be sent to your provider as well.   To learn more about what you can do with MyChart, go to [email or web address].    Your next appointment:   4 week(s)  The format for your next appointment:   In Person  Provider:   You may see Kathlyn Sacramento, MD or one of the following Advanced Practice Providers on your designated Care Team:   Murray Hodgkins, NP Christell Faith, PA-C Cadence Kathlen Mody, PA-C:1}    Other Instructions   [address], [place] [address] Dept: [phone] Loc: [phone]  SUHAS ESTIS  02/04/2021  You are scheduled for a Cardiac Catheterization on Monday, December 5 with Dr. Kathlyn Sacramento.  1. Please arrive at the [facility] [place], [place] at 8:30 AM (This time is one hour before your procedure to ensure your preparation). Free valet parking service is available.   Special note: Every effort is made to have your procedure done on time. Please understand that emergencies sometimes delay scheduled procedures.  2. Diet: Do not eat solid foods after midnight.  The patient may have clear liquids until 5am upon the day of the procedure.  3. Labs: You will need to have blood drawn today (bmp, cbc). You do not need to be fasting.  4. Medication instructions in preparation for your procedure:   Contrast Allergy: No   On the morning of your procedure, take your Aspirin and  any morning medicines NOT listed above.  You may use sips of water.  5. Plan for one night stay--bring personal belongings. 6. Bring a current list of your medications and current insurance cards. 7. You MUST have a responsible person to drive you home. 8. Someone MUST be with you the first 24 hours after you arrive  home or your discharge will be delayed. 9. Please wear clothes that are easy to get on and off and wear slip-on shoes.  Thank you for allowing [place] to care for you!   -- [facility]

## 2021-02-05 LAB — CBC WITH DIFFERENTIAL/PLATELET
Basophils Absolute: 0 10*3/uL (ref 0.0–0.2)
Basos: 1 %
EOS (ABSOLUTE): 0.3 10*3/uL (ref 0.0–0.4)
Eos: 5 %
Hematocrit: 38.8 % (ref 37.5–51.0)
Hemoglobin: 12.9 g/dL — ABNORMAL LOW (ref 13.0–17.7)
Immature Grans (Abs): 0 10*3/uL (ref 0.0–0.1)
Immature Granulocytes: 0 %
Lymphocytes Absolute: 1.5 10*3/uL (ref 0.7–3.1)
Lymphs: 28 %
MCH: 30 pg (ref 26.6–33.0)
MCHC: 33.2 g/dL (ref 31.5–35.7)
MCV: 90 fL (ref 79–97)
Monocytes Absolute: 0.6 10*3/uL (ref 0.1–0.9)
Monocytes: 12 %
Neutrophils Absolute: 2.9 10*3/uL (ref 1.4–7.0)
Neutrophils: 54 %
Platelets: 205 10*3/uL (ref 150–450)
RBC: 4.3 x10E6/uL (ref 4.14–5.80)
RDW: 13.1 % (ref 11.6–15.4)
WBC: 5.3 10*3/uL (ref 3.4–10.8)

## 2021-02-05 LAB — BASIC METABOLIC PANEL
BUN/Creatinine Ratio: 14 (ref 10–24)
BUN: 18 mg/dL (ref 8–27)
CO2: 22 mmol/L (ref 20–29)
Calcium: 9.5 mg/dL (ref 8.6–10.2)
Chloride: 106 mmol/L (ref 96–106)
Creatinine, Ser: 1.3 mg/dL — ABNORMAL HIGH (ref 0.76–1.27)
Glucose: 100 mg/dL — ABNORMAL HIGH (ref 70–99)
Potassium: 4.7 mmol/L (ref 3.5–5.2)
Sodium: 141 mmol/L (ref 134–144)
eGFR: 55 mL/min/{1.73_m2} — ABNORMAL LOW (ref >59)

## 2021-02-07 ENCOUNTER — Encounter
Admission: RE | Disposition: A | Discharge status: Home / Self Care | Payer: Self-pay | Source: Ambulatory Visit | Attending: Cardiovascular Disease

## 2021-02-07 ENCOUNTER — Ambulatory Visit (INDEPENDENT_AMBULATORY_CARE_PROVIDER_SITE_OTHER): Payer: Medicare HMO

## 2021-02-07 ENCOUNTER — Telehealth: Payer: Self-pay

## 2021-02-07 ENCOUNTER — Encounter: Payer: Self-pay | Admitting: Cardiovascular Disease

## 2021-02-07 ENCOUNTER — Other Ambulatory Visit: Payer: Self-pay

## 2021-02-07 ENCOUNTER — Ambulatory Visit
Admission: RE | Admit: 2021-02-07 | Discharge: 2021-02-07 | Disposition: A | Discharge status: Home / Self Care | Payer: Medicare HMO | Source: Ambulatory Visit | Attending: Cardiovascular Disease | Admitting: Cardiovascular Disease

## 2021-02-07 DIAGNOSIS — I714 Abdominal aortic aneurysm, without rupture, unspecified: Secondary | ICD-10-CM | POA: Insufficient documentation

## 2021-02-07 DIAGNOSIS — E785 Hyperlipidemia, unspecified: Secondary | ICD-10-CM | POA: Insufficient documentation

## 2021-02-07 DIAGNOSIS — I25110 Atherosclerotic heart disease of native coronary artery with unstable angina pectoris: Secondary | ICD-10-CM | POA: Insufficient documentation

## 2021-02-07 DIAGNOSIS — R001 Bradycardia, unspecified: Secondary | ICD-10-CM | POA: Diagnosis not present

## 2021-02-07 DIAGNOSIS — Z87891 Personal history of nicotine dependence: Secondary | ICD-10-CM | POA: Insufficient documentation

## 2021-02-07 DIAGNOSIS — Z955 Presence of coronary angioplasty implant and graft: Secondary | ICD-10-CM | POA: Diagnosis not present

## 2021-02-07 DIAGNOSIS — I130 Hypertensive heart and chronic kidney disease with heart failure and stage 1 through stage 4 chronic kidney disease, or unspecified chronic kidney disease: Secondary | ICD-10-CM | POA: Diagnosis not present

## 2021-02-07 DIAGNOSIS — I200 Unstable angina: Secondary | ICD-10-CM

## 2021-02-07 DIAGNOSIS — Z79899 Other long term (current) drug therapy: Secondary | ICD-10-CM | POA: Insufficient documentation

## 2021-02-07 DIAGNOSIS — I272 Pulmonary hypertension, unspecified: Secondary | ICD-10-CM | POA: Insufficient documentation

## 2021-02-07 DIAGNOSIS — R0602 Shortness of breath: Secondary | ICD-10-CM | POA: Diagnosis not present

## 2021-02-07 DIAGNOSIS — I5033 Acute on chronic diastolic (congestive) heart failure: Secondary | ICD-10-CM | POA: Diagnosis not present

## 2021-02-07 DIAGNOSIS — N183 Chronic kidney disease, stage 3 unspecified: Secondary | ICD-10-CM | POA: Insufficient documentation

## 2021-02-07 HISTORY — PX: RIGHT/LEFT HEART CATH AND CORONARY ANGIOGRAPHY: CATH118266

## 2021-02-07 SURGERY — RIGHT/LEFT HEART CATH AND CORONARY ANGIOGRAPHY
Anesthesia: Moderate Sedation

## 2021-02-07 MED ORDER — MIDAZOLAM HCL 2 MG/2ML IJ SOLN
INTRAMUSCULAR | Status: DC | PRN
  Administered 2021-02-07: 1 mg via INTRAVENOUS

## 2021-02-07 MED ORDER — LIDOCAINE HCL 1 % IJ SOLN
INTRAMUSCULAR | Status: AC
  Filled 2021-02-07: qty 20

## 2021-02-07 MED ORDER — MIDAZOLAM HCL 2 MG/2ML IJ SOLN
INTRAMUSCULAR | Status: AC
  Filled 2021-02-07: qty 2

## 2021-02-07 MED ORDER — SODIUM CHLORIDE 0.9 % IV SOLN: 250.0000 mL | INTRAVENOUS | Status: DC | PRN

## 2021-02-07 MED ORDER — ONDANSETRON HCL 4 MG/2ML IJ SOLN: 4.0000 mg | Freq: Four times a day (QID) | INTRAMUSCULAR | Status: DC | PRN

## 2021-02-07 MED ORDER — SODIUM CHLORIDE 0.9% FLUSH: 3.0000 mL | INTRAVENOUS | Status: DC | PRN

## 2021-02-07 MED ORDER — FENTANYL CITRATE (PF) 100 MCG/2ML IJ SOLN
INTRAMUSCULAR | Status: AC
  Filled 2021-02-07: qty 2

## 2021-02-07 MED ORDER — ASPIRIN 81 MG PO CHEW: 81.0000 mg | CHEWABLE_TABLET | ORAL | Status: DC

## 2021-02-07 MED ORDER — VERAPAMIL HCL 2.5 MG/ML IV SOLN
INTRAVENOUS | Status: DC | PRN
  Administered 2021-02-07: 2.5 mg via INTRA_ARTERIAL

## 2021-02-07 MED ORDER — SODIUM CHLORIDE 0.9 % IV SOLN: INTRAVENOUS | Status: DC

## 2021-02-07 MED ORDER — IOHEXOL 350 MG/ML SOLN
INTRAVENOUS | Status: DC | PRN
  Administered 2021-02-07: 77 mL

## 2021-02-07 MED ORDER — HEPARIN (PORCINE) IN NACL 1000-0.9 UT/500ML-% IV SOLN
INTRAVENOUS | Status: AC
  Filled 2021-02-07: qty 1000

## 2021-02-07 MED ORDER — SODIUM CHLORIDE 0.9% FLUSH: 3.0000 mL | Freq: Two times a day (BID) | INTRAVENOUS | Status: DC

## 2021-02-07 MED ORDER — HEPARIN SODIUM (PORCINE) 1000 UNIT/ML IJ SOLN
INTRAMUSCULAR | Status: AC
  Filled 2021-02-07: qty 10

## 2021-02-07 MED ORDER — VERAPAMIL HCL 2.5 MG/ML IV SOLN
INTRAVENOUS | Status: AC
  Filled 2021-02-07: qty 2

## 2021-02-07 MED ORDER — ACETAMINOPHEN 325 MG PO TABS: 650.0000 mg | ORAL_TABLET | ORAL | Status: DC | PRN

## 2021-02-07 MED ORDER — FENTANYL CITRATE (PF) 100 MCG/2ML IJ SOLN
INTRAMUSCULAR | Status: DC | PRN
  Administered 2021-02-07: 25 ug via INTRAVENOUS

## 2021-02-07 MED ORDER — HEPARIN (PORCINE) IN NACL 1000-0.9 UT/500ML-% IV SOLN
INTRAVENOUS | Status: DC | PRN
  Administered 2021-02-07: 1000 mL

## 2021-02-07 MED ORDER — LIDOCAINE HCL (PF) 1 % IJ SOLN
INTRAMUSCULAR | Status: DC | PRN
  Administered 2021-02-07 (×2): 2 mL

## 2021-02-07 MED ORDER — HEPARIN SODIUM (PORCINE) 1000 UNIT/ML IJ SOLN
INTRAMUSCULAR | Status: DC | PRN
  Administered 2021-02-07: 5000 [IU] via INTRAVENOUS

## 2021-02-07 SURGICAL SUPPLY — 14 items
CATH BALLN WEDGE 5F 110CM (CATHETERS) ×1 IMPLANT
CATH INFINITI 5FR ANG PIGTAIL (CATHETERS) ×1 IMPLANT
CATH INFINITI 5FR JK (CATHETERS) ×1 IMPLANT
CATH INFINITI JR4 5F (CATHETERS) ×1 IMPLANT
DEVICE RAD TR BAND REGULAR (VASCULAR PRODUCTS) ×1 IMPLANT
DRAPE BRACHIAL (DRAPES) ×2 IMPLANT
GLIDESHEATH SLEND SS 6F .021 (SHEATH) ×1 IMPLANT
GUIDEWIRE INQWIRE 1.5J.035X260 (WIRE) IMPLANT
INQWIRE 1.5J .035X260CM (WIRE) ×2
PACK CARDIAC CATH (CUSTOM PROCEDURE TRAY) ×2 IMPLANT
PROTECTION STATION PRESSURIZED (MISCELLANEOUS) ×2
SET ATX SIMPLICITY (MISCELLANEOUS) ×1 IMPLANT
SHEATH GLIDE SLENDER 4/5FR (SHEATH) ×1 IMPLANT
STATION PROTECTION PRESSURIZED (MISCELLANEOUS) IMPLANT

## 2021-02-07 NOTE — Telephone Encounter (Signed)
DPR on file. Spoke with the patients wife. Advised her that the recommended zio XT has been ordered to be mailed to the patients home. Advised her that it is to be worn for 14 days. They should follow the application and return instruction that come with the device. Adv her to contact [employer] if they need to trouble shoot.  Pt wife sts that the patient is resting comfortably on the recliner after his cardiac cath today.  Patient verbalized understanding to the instruction given and has no questions or concerns at this time.

## 2021-02-07 NOTE — Progress Notes (Signed)
PT had brief episode of sudden onset L upper chest pain, sharp in nature. Relieved in less than 2 minutes. EKG exported for MD to see and MD sent messaged regarding such.

## 2021-02-07 NOTE — Interval H&P Note (Signed)
History and Physical Interval Note:  02/07/2021 9:37 AM  Nathaniel Thompson  has presented today for surgery, with the diagnosis of RT LT Heart Cath   Shortness of breath  Unstable angina.  The various methods of treatment have been discussed with the patient and family. After consideration of risks, benefits and other options for treatment, the patient has consented to  Procedure(s): RIGHT/LEFT HEART CATH AND CORONARY ANGIOGRAPHY (N/A) as a surgical intervention.  The patient's history has been reviewed, patient examined, no change in status, stable for surgery.  I have reviewed the patient's chart and labs.  Questions were answered to the patient's satisfaction.     Kathlyn Sacramento

## 2021-02-07 NOTE — Telephone Encounter (Signed)
-----   Message from [name], MD sent at 02/07/2021 11:09 AM EST ----- Please arrange for this patient to have a 2-week ZIO monitor done due to bradycardia and dizziness.

## 2021-02-14 ENCOUNTER — Other Ambulatory Visit: Payer: Self-pay | Admitting: Nurse Practitioner

## 2021-02-14 NOTE — Telephone Encounter (Signed)
This is a [place] pt 

## 2021-02-21 ENCOUNTER — Telehealth: Payer: Self-pay | Admitting: *Deleted

## 2021-02-21 NOTE — Telephone Encounter (Signed)
Transition Care Management Follow-up Telephone Call Date of discharge and from where: 02/07/2021  [facility] How have you been since you were released from the hospital? "Very well" Any questions or concerns? No  Items Reviewed: Did the pt receive and understand the discharge instructions provided? Yes  Medications obtained and verified? Yes  Other? No  Any new allergies since your discharge? No  Dietary orders reviewed? Yes Do you have support at home? Yes    spouse  Home Care and Equipment/Supplies: Were home health services ordered? no If so, what is the name of the agency? N/a  Has the agency set up a time to come to the patient's home? not applicable Were any new equipment or medical supplies ordered?  No What is the name of the medical supply agency? N/a Were you able to get the supplies/equipment? not applicable Do you have any questions related to the use of the equipment or supplies? N/a  Functional Questionnaire: (I = Independent and D = Dependent) ADLs: I  Bathing/Dressing- I  Meal Prep- I  Eating- I  Maintaining continence- I  Transferring/Ambulation- I  Managing Meds- I  Follow up appointments reviewed:  PCP Hospital f/u appt confirmed? No   pt reports he is seeing cardiologist on 03/10/21 Specialist Hospital f/u appt confirmed? Yes  Scheduled to see Cadence Kathlen Mody (cardiology) on 03/10/2021. Are transportation arrangements needed? No  If their condition worsens, is the pt aware to call PCP or go to the Emergency Dept.? Yes Was the patient provided with contact information for the PCP's office or ED? Yes Was to pt encouraged to call back with questions or concerns? Yes   Jacqlyn Larsen [facility], BSN RN Case Manager [phone]

## 2021-03-04 NOTE — Progress Notes (Deleted)
Cardiology Office Note:    Date:  03/04/2021   ID:  Nathaniel Thompson, DOB 07-02-1935, MRN [id number]  PCP:  Einar Pheasant, MD  [facility] Cardiologist:  Kathlyn Sacramento, MD  [facility] Electrophysiologist:  None   Referring MD: Einar Pheasant, MD   Chief Complaint: 4 weeks post-cath  History of Present Illness:    Nathaniel Thompson is a 85 y.o. male with a hx of ***  Past Medical History:  Diagnosis Date   (HFpEF) heart failure with preserved ejection fraction ([place])    a. 05/2018 Echo: Nl EF, Gr1 DD, sev dil LA, mild MR/TR, mild PAH; b. 05/2019 Echo: EF 55-60%, no rwma, mild LVH. Nl PASP. Mildly dil LA. Triv MR. Mild AoV sclerosis w/o stenosis. Mildly dil Asc AO (23mm).   Allergic rhinitis    Barrett's esophagus 10/21/13   Bone cancer (HCC)    CKD (chronic kidney disease), stage III (HCC)    Colon polyp, hyperplastic    COPD (chronic obstructive pulmonary disease) (HCC)    mild COPD. former smoker   Coronary artery disease    a. 2003 s/p PCI ([facility]); b. 2004 s/p PCI x 2 ([facility]); c. 11/2017 Cath: LM nl, LAD mild dzs, LCX patent stent w/ 30% distal edge restenosis, RCA patent distal stent w/ 30% prox edge stenosis. Nl EF->Med Rx.   Diverticulosis    Fundic gland polyps of stomach, benign    Gastritis    GERD (gastroesophageal reflux disease)    Hypercholesteremia    Hypertension    Prostate cancer ([place])    Prostatism    Reflux esophagitis    Renal stones    Skin cancer    left cheek/lesion excised   Sleep apnea    Tachycardia    a. 11/2017 admit w/ tachycardia/? afib-->no afib noted upon review (amio/OAC d/c'd).   TIA (transient ischemic attack)     Past Surgical History:  Procedure Laterality Date   CARDIAC CATHETERIZATION     COLONOSCOPY WITH PROPOFOL N/A 09/13/2015   Procedure: COLONOSCOPY WITH PROPOFOL;  Surgeon: Lollie Sails, MD;  Location: [facility] ENDOSCOPY;  Service: Endoscopy;  Laterality: N/A;   COLONOSCOPY WITH PROPOFOL N/A 05/07/2019   Procedure:  COLONOSCOPY WITH PROPOFOL;  Surgeon: Robert Bellow, MD;  Location: [facility] ENDOSCOPY;  Service: Endoscopy;  Laterality: N/A;   CORONARY ANGIOPLASTY     ESOPHAGOGASTRODUODENOSCOPY (EGD) WITH PROPOFOL N/A 09/13/2015   Procedure: ESOPHAGOGASTRODUODENOSCOPY (EGD) WITH PROPOFOL;  Surgeon: Lollie Sails, MD;  Location: [facility] ENDOSCOPY;  Service: Endoscopy;  Laterality: N/A;   ESOPHAGOGASTRODUODENOSCOPY (EGD) WITH PROPOFOL N/A 12/28/2015   Procedure: ESOPHAGOGASTRODUODENOSCOPY (EGD) WITH PROPOFOL;  Surgeon: Lollie Sails, MD;  Location: [facility] ENDOSCOPY;  Service: Endoscopy;  Laterality: N/A;   ESOPHAGOGASTRODUODENOSCOPY (EGD) WITH PROPOFOL N/A 06/16/2016   Procedure: ESOPHAGOGASTRODUODENOSCOPY (EGD) WITH PROPOFOL;  Surgeon: Lollie Sails, MD;  Location: [facility] ENDOSCOPY;  Service: Endoscopy;  Laterality: N/A;   ESOPHAGOGASTRODUODENOSCOPY (EGD) WITH PROPOFOL N/A 05/07/2019   Procedure: ESOPHAGOGASTRODUODENOSCOPY (EGD) WITH PROPOFOL;  Surgeon: Robert Bellow, MD;  Location: [facility] ENDOSCOPY;  Service: Endoscopy;  Laterality: N/A;   EYE SURGERY  2013   CATARACT EXTRACTION   GANGLION CYST EXCISION Right 05/18/2017   Procedure: REMOVAL GANGLION CYST ANKLE;  Surgeon: Samara Deist, DPM;  Location: [facility] ORS;  Service: Podiatry;  Laterality: Right;   heart cath stent     LEFT HEART CATH AND CORONARY ANGIOGRAPHY Right 12/03/2017   Procedure: Left Heart Cath and Coronary Angiography with possible coronary intervention;  Surgeon: Neoma Laming  A, MD;  Location: [place] CV LAB;  Service: Cardiovascular;  Laterality: Right;   PROSTATE BIOPSY     RIGHT/LEFT HEART CATH AND CORONARY ANGIOGRAPHY N/A 02/07/2021   Procedure: RIGHT/LEFT HEART CATH AND CORONARY ANGIOGRAPHY;  Surgeon: [name], MD;  Location: [place] [facility];  Service: Cardiovascular;  Laterality: N/A;   stents     multiple    Current Medications: No outpatient medications have been marked as taking for the 03/10/21 encounter  (Appointment) with Kathlen Mody, Leroy Trim H, PA-C.     Allergies:   Atorvastatin   Social History   Socioeconomic History   Marital status: Married    Spouse name: Not on file   Number of children: Not on file   Years of education: Not on file   Highest education level: Not on file  Occupational History   Not on file  Tobacco Use   Smoking status: Former    Packs/day: 1.00    Years: 35.00    Pack years: 35.00    Types: Cigarettes    Quit date: 03/06/1986    Years since quitting: 35.0   Smokeless tobacco: Former    Types: Nurse, children's Use: Never used  Substance and Sexual Activity   Alcohol use: No   Drug use: No   Sexual activity: Not Currently  Other Topics Concern   Not on file  Social History Narrative   Not on file   Social Determinants of Health   Financial Resource Strain: Low Risk    Difficulty of Paying Living Expenses: Not hard at all  Food Insecurity: No Food Insecurity   Worried About Charity fundraiser in the Last Year: Never true   [place] in the Last Year: Never true  Transportation Needs: No Transportation Needs   Lack of Transportation (Medical): No   Lack of Transportation (Non-Medical): No  Physical Activity: Unknown   Days of Exercise per Week: 0 days   Minutes of Exercise per Session: Not on file  Stress: No Stress Concern Present   Feeling of Stress : Not at all  Social Connections: Unknown   Frequency of Communication with Friends and Family: Not on file   Frequency of Social Gatherings with Friends and Family: Not on file   Attends Religious Services: Not on file   Active Member of Clubs or Organizations: Not on file   Attends Archivist Meetings: Not on file   Marital Status: Married     Family History: The patient's ***family history includes Cancer in his brother, brother, daughter, father, mother, sister, and another family member; Stroke in his father.  ROS:   Please see the history of present illness.     *** All other systems reviewed and are negative.  EKGs/Labs/Other Studies Reviewed:    The following studies were reviewed today: ***  EKG:  EKG is *** ordered today.  The ekg ordered today demonstrates ***  Recent Labs: 06/17/2020: TSH 2.15 11/19/2020: ALT 15 02/04/2021: BUN 18; Creatinine, Ser 1.30; Hemoglobin 12.9; Platelets 205; Potassium 4.7; Sodium 141  Recent Lipid Panel    Component Value Date/Time   CHOL 128 10/25/2020 0841   CHOL 103 09/16/2013 0438   TRIG 166.0 (H) 10/25/2020 0841   TRIG 95 09/16/2013 0438   HDL 32.20 (L) 10/25/2020 0841   HDL 30 (L) 09/16/2013 0438   CHOLHDL 4 10/25/2020 0841   VLDL 33.2 10/25/2020 0841   VLDL 19 09/16/2013 0438  [place] 63 10/25/2020 0841   LDLCALC 54 09/16/2013 0438     Risk Assessment/Calculations:   {Does this patient have ATRIAL FIBRILLATION?[phone]}   Physical Exam:    VS:  There were no vitals taken for this visit.    Wt Readings from Last 3 Encounters:  02/07/21 220 lb (99.8 kg)  02/04/21 220 lb (99.8 kg)  01/21/21 223 lb (101.2 kg)     GEN: *** Well nourished, well developed in no acute distress HEENT: Normal NECK: No JVD; No carotid bruits LYMPHATICS: No lymphadenopathy CARDIAC: ***RRR, no murmurs, rubs, gallops RESPIRATORY:  Clear to auscultation without rales, wheezing or rhonchi  ABDOMEN: Soft, non-tender, non-distended MUSCULOSKELETAL:  No edema; No deformity  SKIN: Warm and dry NEUROLOGIC:  Alert and oriented x 3 PSYCHIATRIC:  Normal affect   ASSESSMENT:    No diagnosis found. PLAN:    In order of problems listed above:  ***  Disposition: Follow up {follow up:15908} with ***   Shared Decision Making/Informed Consent   {Are you ordering a CV Procedure (e.g. stress test, cath, DCCV, TEE, etc)?   Press F2        :[id number]}    Signed, Alahna Dunne Ninfa Meeker, PA-C  03/04/2021 1:02 PM    [facility]

## 2021-03-09 ENCOUNTER — Encounter: Payer: Self-pay | Admitting: Medical

## 2021-03-09 NOTE — Progress Notes (Signed)
Office Visit    Patient Name: Nathaniel Thompson Date of Encounter: 03/10/2021  Primary Care Provider:  Einar Pheasant, MD Primary Cardiologist:  Kathlyn Sacramento, MD  Chief Complaint    86 year old male with a history of CAD (s/p prior stenting to RCA and LCx), chronic diastolic heart failure, atrial fibrillation, SVT, hypertension, hyperlipidemia, mild carotid artery disease, hypokalemia, metastatic prostate cancer, COPD, CKD stage III, Barrett's esophagus, TIA, and nephrolithiasis who presents for follow-up related to CAD and recent bradycardia.    Past Medical History    Past Medical History:  Diagnosis Date   (HFpEF) heart failure with preserved ejection fraction ([place])    a. 05/2018 Echo: Nl EF, Gr1 DD, sev dil LA, mild MR/TR, mild PAH; b. 05/2019 Echo: EF 55-60%, no rwma, mild LVH. Nl PASP. Mildly dil LA. Triv MR. Mild AoV sclerosis w/o stenosis. Mildly dil Asc AO (57mm).   Allergic rhinitis    Barrett's esophagus 10/21/2013   Bone cancer (HCC)    CKD (chronic kidney disease), stage III (HCC)    Colon polyp, hyperplastic    COPD (chronic obstructive pulmonary disease) (HCC)    mild COPD. former smoker   Coronary artery disease    a. 2003 s/p PCI ([facility]); b. 2004 s/p PCI x 2 ([facility]); c. 11/2017 Cath: LM nl, LAD mild dzs, LCX patent stent w/ 30% distal edge restenosis, RCA patent distal stent w/ 30% prox edge stenosis. Nl EF->Med Rx;   Diverticulosis    Fundic gland polyps of stomach, benign    Gastritis    GERD (gastroesophageal reflux disease)    Hypercholesteremia    Hypertension    Prostate cancer ([place])    Prostatism    Reflux esophagitis    Renal stones    Skin cancer    left cheek/lesion excised   Sleep apnea    Tachycardia    a. 11/2017 admit w/ tachycardia/? afib-->no afib noted upon review (amio/OAC d/c'd).   TIA (transient ischemic attack)    Past Surgical History:  Procedure Laterality Date   CARDIAC CATHETERIZATION     COLONOSCOPY WITH PROPOFOL N/A 09/13/2015    Procedure: COLONOSCOPY WITH PROPOFOL;  Surgeon: Lollie Sails, MD;  Location: [facility] ENDOSCOPY;  Service: Endoscopy;  Laterality: N/A;   COLONOSCOPY WITH PROPOFOL N/A 05/07/2019   Procedure: COLONOSCOPY WITH PROPOFOL;  Surgeon: Robert Bellow, MD;  Location: [facility] ENDOSCOPY;  Service: Endoscopy;  Laterality: N/A;   CORONARY ANGIOPLASTY     ESOPHAGOGASTRODUODENOSCOPY (EGD) WITH PROPOFOL N/A 09/13/2015   Procedure: ESOPHAGOGASTRODUODENOSCOPY (EGD) WITH PROPOFOL;  Surgeon: Lollie Sails, MD;  Location: [facility] ENDOSCOPY;  Service: Endoscopy;  Laterality: N/A;   ESOPHAGOGASTRODUODENOSCOPY (EGD) WITH PROPOFOL N/A 12/28/2015   Procedure: ESOPHAGOGASTRODUODENOSCOPY (EGD) WITH PROPOFOL;  Surgeon: Lollie Sails, MD;  Location: [facility] ENDOSCOPY;  Service: Endoscopy;  Laterality: N/A;   ESOPHAGOGASTRODUODENOSCOPY (EGD) WITH PROPOFOL N/A 06/16/2016   Procedure: ESOPHAGOGASTRODUODENOSCOPY (EGD) WITH PROPOFOL;  Surgeon: Lollie Sails, MD;  Location: [facility] ENDOSCOPY;  Service: Endoscopy;  Laterality: N/A;   ESOPHAGOGASTRODUODENOSCOPY (EGD) WITH PROPOFOL N/A 05/07/2019   Procedure: ESOPHAGOGASTRODUODENOSCOPY (EGD) WITH PROPOFOL;  Surgeon: Robert Bellow, MD;  Location: [facility] ENDOSCOPY;  Service: Endoscopy;  Laterality: N/A;   EYE SURGERY  2013   CATARACT EXTRACTION   GANGLION CYST EXCISION Right 05/18/2017   Procedure: REMOVAL GANGLION CYST ANKLE;  Surgeon: Samara Deist, DPM;  Location: [facility] ORS;  Service: Podiatry;  Laterality: Right;   heart cath stent     LEFT HEART CATH AND CORONARY ANGIOGRAPHY Right  12/03/2017   Procedure: Left Heart Cath and Coronary Angiography with possible coronary intervention;  Surgeon: Dionisio David, MD;  Location: [place] CV LAB;  Service: Cardiovascular;  Laterality: Right;   PROSTATE BIOPSY     RIGHT/LEFT HEART CATH AND CORONARY ANGIOGRAPHY N/A 02/07/2021   Procedure: RIGHT/LEFT HEART CATH AND CORONARY ANGIOGRAPHY;  Surgeon: [name], MD;  Location:  [place] CV LAB;  Service: Cardiovascular;  Laterality: N/A;   stents     multiple    Allergies  Allergies  Allergen Reactions   Atorvastatin Other (See Comments)    Achy joints    History of Present Illness    86 year old male with the above past medical history including CAD (s/p prior stenting to RCA and LCx), chronic diastolic heart failure, atrial fibrillation, SVT, hypertension, hyperlipidemia, mild carotid artery disease, hypokalemia, metastatic prostate cancer, COPD, CKD stage III, Barrett's esophagus, TIA, and nephrolithiasis.   He has a history of prior stenting to the LCx and RCA in 2003 with repeat PCI x2 at [facility] in 2004. Cath in 2019 showed patent stent to Cx with 30% distal edge restenosis as patent dRCA stent with 30% proximal stenosis. EF normal at the time. History of tachycardia with question of atrial fibrillation. He was initially placed on amiodarone and Eliquis, however, these were later discontinued as his EKG showed NSR. He established care with Dr. Fletcher Anon in February 2021. Repeat echo in 2021 showed EF 55-65%, mild LVH and mildly dilated ascending aorta of 39 mm (followed by vascular surgery).   He was last seen in the office on 02/04/2021 and reported a 2 week history of intermittent episodes of chest pain and tightness with increased heart burn with exertion and at rest. He also reported increased shortness of breath, orthopnea, weight gain, and increased LEE.   He underwent outpatient R/L heart catheterization on 02/07/2021 which showed patent stents to LCx and RCA, no evidence of obstructive CAD, mildly elevated filling pressures and mild pulmonary hypertension with normal cardiac output, normal LV function. He had intermittent bradycardia during cath, also with recent episodes of dizziness and presyncope. Zio monitor showed monitor frequent episodes of SVT as well as atrial fibrillation. Given recent bradycardia, Dr. Fletcher Anon recommended EP referral to discuss  management options including possible pacemaker.   He presents today for follow-up accompanied by his wife. Since his catheterization he continues to have regular episodes of dizziness, lightheadedness, presyncope and fleeting chest pain.  Symptoms often occur with positional changes. He denies syncope. He is inquiring as to the results of his heart catheterization and 2-week ZIO monitor.  He did have an episode of dizziness/lightheadedness twice during his visit today.  He is concerned that his symptoms are persistent and not improving and limit his daily activities.  Additionally, he reports fairly regular orthopnea despite adherence to CPAP and mild bilateral lower extremity edema and abdominal tightness. Home Medications    Current Outpatient Medications  Medication Sig Dispense Refill   amLODipine (NORVASC) 10 MG tablet Take 5 mg by mouth daily.     aspirin EC 81 MG tablet Take 81 mg by mouth daily.     azelastine (ASTELIN) 0.1 % nasal spray Place 1 spray into both nostrils as needed for rhinitis. Use in each nostril as directed     Calcium Carb-Cholecalciferol (CALCIUM + D3 PO) Take 1 tablet by mouth daily.     cholecalciferol (VITAMIN D) 1000 units tablet Take 1,000 Units by mouth daily.      clopidogrel (PLAVIX)  75 MG tablet TAKE 1 TABLET BY MOUTH ONCE DAILY 90 tablet 0   ferrous sulfate 324 MG TBEC Take 324 mg by mouth daily.     fexofenadine (ALLEGRA) 180 MG tablet Take 180 mg by mouth daily as needed for allergies or rhinitis.      fluticasone (FLONASE) 50 MCG/ACT nasal spray Place 2 sprays into both nostrils daily as needed for allergies.     furosemide (LASIX) 20 MG tablet Take 1 tablet (20 mg total) by mouth daily. 30 tablet 3   isosorbide mononitrate (IMDUR) 30 MG 24 hr tablet TAKE 1 TABLET BY MOUTH ONCE DAILY 90 tablet 2   losartan (COZAAR) 50 MG tablet Take 1 tablet (50 mg total) by mouth daily. 90 tablet 1   lovastatin (MEVACOR) 40 MG tablet TAKE 2 TABLETS BY MOUTH AT BEDTIME 180  tablet 1   Multiple Vitamin (MULTI-VITAMINS) TABS Take 1 tablet by mouth daily.      pantoprazole (PROTONIX) 40 MG tablet Take 40 mg by mouth 2 (two) times daily.      No current facility-administered medications for this visit.     Review of Systems    He denies dyspnea, pnd, n, v,  syncope, weight gain, or early satiety. All other systems reviewed and are otherwise negative except as noted above.  Physical Exam    VS:  BP 130/62 (BP Location: Left Arm, Patient Position: Sitting, Cuff Size: Normal)    Pulse 64    Ht 5\' 10"  (1.778 m)    Wt 224 lb (101.6 kg)    BMI 32.14 kg/m   GEN: Well nourished, well developed, in no acute distress. HEENT: normal. Neck: Supple, no JVD, carotid bruits, or masses. Cardiac: RRR, no murmurs, rubs, or gallops. No clubbing, cyanosis.  Non-pitting BLE edema. Radials/DP/PT 2+ and equal bilaterally.  Right radial cath site without bruising bleeding or hematoma. Respiratory:  Respirations regular and unlabored, clear to auscultation bilaterally. GI: Obese, firm, nontender, nondistended, BS + x 4. MS: no deformity or atrophy. Skin: warm and dry, no rash. Neuro:  Strength and sensation are intact. Psych: Normal affect.  Accessory Clinical Findings    ECG personally reviewed by me today - NSR, 64 bpm, non-specific t-wave abnormality - no acute changes.  Lab Results  Component Value Date   WBC 5.3 02/04/2021   HGB 12.9 (L) 02/04/2021   HCT 38.8 02/04/2021   MCV 90 02/04/2021   PLT 205 02/04/2021   Lab Results  Component Value Date   CREATININE 1.30 (H) 02/04/2021   BUN 18 02/04/2021   NA 141 02/04/2021   K 4.7 02/04/2021   CL 106 02/04/2021   CO2 22 02/04/2021   Lab Results  Component Value Date   ALT 15 11/19/2020   AST 21 11/19/2020   ALKPHOS 53 11/19/2020   BILITOT 0.6 11/19/2020   Lab Results  Component Value Date   CHOL 128 10/25/2020   HDL 32.20 (L) 10/25/2020   LDLCALC 63 10/25/2020   TRIG 166.0 (H) 10/25/2020   CHOLHDL 4  10/25/2020    Lab Results  Component Value Date   HGBA1C 5.9 10/25/2020    Assessment & Plan    1. CAD/atypical chest pain: R/L heart catheterization on 02/07/2021 showed patent stents to LCx and RCA, no evidence of obstructive CAD, mildly elevated filling pressures and mild pulmonary hypertension with normal cardiac output, normal LV function. Suspected GI etiology for chest pain. No anginal symptoms. He does have episodes of fleeting atypical chest pain that  occur with dizziness and lightheadedness.  Recent heart cath normal and very reassuring.  I suspect this is related to his underlying heart rhythm issues and/or orthostatic hypotension.  He is orthostatic positive in office today and symptomatic. Continue ASA, Plavix, amlodipine, losartan, Imdur.   2. Bradycardia/atrial fibrillation/SVT: Intermittent bradycardia during recent cath, also with recent episodes of dizziness and presyncope. Zio monitor from 02/2021 showed frequent episodes of SVT as well as atrial fibrillation, HR min 42, max 203 bpm, with inconsistency between triggered events and arrhythmias.  Not a candidate for beta-blocker therapy given bradycardia. CHA2DS2VASc = 7, however, given low A. fib burden, and per discussion with Dr. Fletcher Anon, will not transition to [place] at this time.  Orthostatic hypotension as below.  Repeat orthostatics with rhythm strips was unrevealing for arrhythmia associated with any symptoms.  Will refer to EP for further evaluation and additional management.  3.  Orthostatic hypotension: He is orthostatic positive in office today.  Many of his a symptoms of dizziness, lightheadedness, chest pain, and presyncope occur with positional changes. He does have underlying history of bradycardia, SVT, PVCs, and new atrial fibrillation.  However, it is likely that orthostasis is playing a large part in his symptoms.  Case discussed with Dr. Caryl Comes.  Recommend thigh sleeves and abdominal binder to help with orthostatic  hypotension.  Patient and wife verbalized understanding.  He will monitor home blood pressure and report readings BP consistently less than 100 or greater than 130/80.  4. Diastolic heart failure:  Recent RHC as above.  He does report mild bilateral lower extremity edema, and occasional orthopnea.  He is not weighing himself at home.  Generally, well compensated on exam.  Discussed need for diuretic in the setting of orthostatic hypotension with Dr. Caryl Comes, will have him start Lasix 20 mg daily with use of thigh sleeves and abdominal binder as above.  Repeat BMET in 1 week.  I will have him monitor blood pressure.   5. Hypertension: BP overall well controlled, with some elevated readings in office today during orthostatic vital signs.  He is orthostatic as above. Continue current antihypertensive regimen.   6. Hyperlipidemia: LDL was 68 on 06/2020. Continue lovastatin.   7. Mild carotid artery disease: Carotid dopplers in 01/2021 showed 1-39% BICA stenosis.  No bruit on exam today.  Repeat monitoring as needed.  8. AAA: 2.6 cm on most reset abdominal ultrasound on 01/2021, unchanged from prior. Stable, followed by vascular surgery.   9. Disposition: Follow-up with EP.  Follow-up in 3 months.  Lenna Sciara, NP 03/10/2021, 1:07 PM

## 2021-03-09 NOTE — Progress Notes (Deleted)
Office Visit    Patient Name: Nathaniel Thompson Date of Encounter: 03/09/2021  Primary Care Provider:  Einar Pheasant, MD Primary Cardiologist:  Kathlyn Sacramento, MD  Chief Complaint    ***  Past Medical History    Past Medical History:  Diagnosis Date   (HFpEF) heart failure with preserved ejection fraction ([place])    a. 05/2018 Echo: Nl EF, Gr1 DD, sev dil LA, mild MR/TR, mild PAH; b. 05/2019 Echo: EF 55-60%, no rwma, mild LVH. Nl PASP. Mildly dil LA. Triv MR. Mild AoV sclerosis w/o stenosis. Mildly dil Asc AO (76mm).   Allergic rhinitis    Barrett's esophagus 10/21/13   Bone cancer (HCC)    CKD (chronic kidney disease), stage III (HCC)    Colon polyp, hyperplastic    COPD (chronic obstructive pulmonary disease) (HCC)    mild COPD. former smoker   Coronary artery disease    a. 2003 s/p PCI ([facility]); b. 2004 s/p PCI x 2 ([facility]); c. 11/2017 Cath: LM nl, LAD mild dzs, LCX patent stent w/ 30% distal edge restenosis, RCA patent distal stent w/ 30% prox edge stenosis. Nl EF->Med Rx.   Diverticulosis    Fundic gland polyps of stomach, benign    Gastritis    GERD (gastroesophageal reflux disease)    Hypercholesteremia    Hypertension    Prostate cancer ([place])    Prostatism    Reflux esophagitis    Renal stones    Skin cancer    left cheek/lesion excised   Sleep apnea    Tachycardia    a. 11/2017 admit w/ tachycardia/? afib-->no afib noted upon review (amio/OAC d/c'd).   TIA (transient ischemic attack)    Past Surgical History:  Procedure Laterality Date   CARDIAC CATHETERIZATION     COLONOSCOPY WITH PROPOFOL N/A 09/13/2015   Procedure: COLONOSCOPY WITH PROPOFOL;  Surgeon: Lollie Sails, MD;  Location: [facility] ENDOSCOPY;  Service: Endoscopy;  Laterality: N/A;   COLONOSCOPY WITH PROPOFOL N/A 05/07/2019   Procedure: COLONOSCOPY WITH PROPOFOL;  Surgeon: Robert Bellow, MD;  Location: [facility] ENDOSCOPY;  Service: Endoscopy;  Laterality: N/A;   CORONARY ANGIOPLASTY      ESOPHAGOGASTRODUODENOSCOPY (EGD) WITH PROPOFOL N/A 09/13/2015   Procedure: ESOPHAGOGASTRODUODENOSCOPY (EGD) WITH PROPOFOL;  Surgeon: Lollie Sails, MD;  Location: [facility] ENDOSCOPY;  Service: Endoscopy;  Laterality: N/A;   ESOPHAGOGASTRODUODENOSCOPY (EGD) WITH PROPOFOL N/A 12/28/2015   Procedure: ESOPHAGOGASTRODUODENOSCOPY (EGD) WITH PROPOFOL;  Surgeon: Lollie Sails, MD;  Location: [facility] ENDOSCOPY;  Service: Endoscopy;  Laterality: N/A;   ESOPHAGOGASTRODUODENOSCOPY (EGD) WITH PROPOFOL N/A 06/16/2016   Procedure: ESOPHAGOGASTRODUODENOSCOPY (EGD) WITH PROPOFOL;  Surgeon: Lollie Sails, MD;  Location: [facility] ENDOSCOPY;  Service: Endoscopy;  Laterality: N/A;   ESOPHAGOGASTRODUODENOSCOPY (EGD) WITH PROPOFOL N/A 05/07/2019   Procedure: ESOPHAGOGASTRODUODENOSCOPY (EGD) WITH PROPOFOL;  Surgeon: Robert Bellow, MD;  Location: [facility] ENDOSCOPY;  Service: Endoscopy;  Laterality: N/A;   EYE SURGERY  2013   CATARACT EXTRACTION   GANGLION CYST EXCISION Right 05/18/2017   Procedure: REMOVAL GANGLION CYST ANKLE;  Surgeon: Samara Deist, DPM;  Location: [facility] ORS;  Service: Podiatry;  Laterality: Right;   heart cath stent     LEFT HEART CATH AND CORONARY ANGIOGRAPHY Right 12/03/2017   Procedure: Left Heart Cath and Coronary Angiography with possible coronary intervention;  Surgeon: Dionisio David, MD;  Location: [place] CV LAB;  Service: Cardiovascular;  Laterality: Right;   PROSTATE BIOPSY     RIGHT/LEFT HEART CATH AND CORONARY ANGIOGRAPHY N/A 02/07/2021   Procedure:  RIGHT/LEFT HEART CATH AND CORONARY ANGIOGRAPHY;  Surgeon: [name], MD;  Location: [place] CV LAB;  Service: Cardiovascular;  Laterality: N/A;   stents     multiple    Allergies  Allergies  Allergen Reactions   Atorvastatin Other (See Comments)    Achy joints    History of Present Illness    86 year old male with a history of CAD (s/p DES LCx/RCA)  Home Medications    Current Outpatient Medications   Medication Sig Dispense Refill   amLODipine (NORVASC) 10 MG tablet Take 5 mg by mouth daily.     aspirin EC 81 MG tablet Take 81 mg by mouth daily.     azelastine (ASTELIN) 0.1 % nasal spray Place 1 spray into both nostrils as needed for rhinitis. Use in each nostril as directed     Calcium Carb-Cholecalciferol (CALCIUM + D3 PO) Take 1 tablet by mouth daily.     cholecalciferol (VITAMIN D) 1000 units tablet Take 1,000 Units by mouth daily.      clopidogrel (PLAVIX) 75 MG tablet TAKE 1 TABLET BY MOUTH ONCE DAILY 90 tablet 0   ferrous sulfate 324 MG TBEC Take 324 mg by mouth daily.     fexofenadine (ALLEGRA) 180 MG tablet Take 180 mg by mouth daily as needed for allergies or rhinitis.      fluticasone (FLONASE) 50 MCG/ACT nasal spray Place 2 sprays into both nostrils daily as needed for allergies.     isosorbide mononitrate (IMDUR) 30 MG 24 hr tablet TAKE 1 TABLET BY MOUTH ONCE DAILY 90 tablet 2   losartan (COZAAR) 50 MG tablet Take 1 tablet (50 mg total) by mouth daily. 90 tablet 1   lovastatin (MEVACOR) 40 MG tablet TAKE 2 TABLETS BY MOUTH AT BEDTIME 180 tablet 1   Multiple Vitamin (MULTI-VITAMINS) TABS Take 1 tablet by mouth daily.      pantoprazole (PROTONIX) 40 MG tablet Take 40 mg by mouth 2 (two) times daily.      No current facility-administered medications for this visit.     Review of Systems    ***.  All other systems reviewed and are otherwise negative except as noted above.    Physical Exam    VS:  There were no vitals taken for this visit. , BMI There is no height or weight on file to calculate BMI.     GEN: Well nourished, well developed, in no acute distress. HEENT: normal. Neck: Supple, no JVD, carotid bruits, or masses. Cardiac: RRR, no murmurs, rubs, or gallops. No clubbing, cyanosis, edema.  Radials/DP/PT 2+ and equal bilaterally.  Respiratory:  Respirations regular and unlabored, clear to auscultation bilaterally. GI: Soft, nontender, nondistended, BS + x 4. MS:  no deformity or atrophy. Skin: warm and dry, no rash. Neuro:  Strength and sensation are intact. Psych: Normal affect.  Accessory Clinical Findings    ECG personally reviewed by me today - *** - no acute changes.  Lab Results  Component Value Date   WBC 5.3 02/04/2021   HGB 12.9 (L) 02/04/2021   HCT 38.8 02/04/2021   MCV 90 02/04/2021   PLT 205 02/04/2021   Lab Results  Component Value Date   CREATININE 1.30 (H) 02/04/2021   BUN 18 02/04/2021   NA 141 02/04/2021   K 4.7 02/04/2021   CL 106 02/04/2021   CO2 22 02/04/2021   Lab Results  Component Value Date   ALT 15 11/19/2020   AST 21 11/19/2020   ALKPHOS 53 11/19/2020  BILITOT 0.6 11/19/2020   Lab Results  Component Value Date   CHOL 128 10/25/2020   HDL 32.20 (L) 10/25/2020   LDLCALC 63 10/25/2020   TRIG 166.0 (H) 10/25/2020   CHOLHDL 4 10/25/2020    Lab Results  Component Value Date   HGBA1C 5.9 10/25/2020    Assessment & Plan    1.  ***   Lenna Sciara, NP 03/09/2021, 5:09 PM

## 2021-03-10 ENCOUNTER — Encounter: Payer: Self-pay | Admitting: Nurse Practitioner

## 2021-03-10 ENCOUNTER — Other Ambulatory Visit: Payer: Self-pay

## 2021-03-10 ENCOUNTER — Telehealth: Payer: Self-pay | Admitting: Medical

## 2021-03-10 ENCOUNTER — Ambulatory Visit: Payer: Medicare HMO | Admitting: Nurse Practitioner

## 2021-03-10 VITALS — BP 130/62 | HR 64 | Ht 70.0 in | Wt 224.0 lb

## 2021-03-10 DIAGNOSIS — I471 Supraventricular tachycardia, unspecified: Secondary | ICD-10-CM

## 2021-03-10 DIAGNOSIS — R0789 Other chest pain: Secondary | ICD-10-CM | POA: Diagnosis not present

## 2021-03-10 DIAGNOSIS — I2510 Atherosclerotic heart disease of native coronary artery without angina pectoris: Secondary | ICD-10-CM

## 2021-03-10 DIAGNOSIS — I5032 Chronic diastolic (congestive) heart failure: Secondary | ICD-10-CM

## 2021-03-10 DIAGNOSIS — I714 Abdominal aortic aneurysm, without rupture, unspecified: Secondary | ICD-10-CM

## 2021-03-10 DIAGNOSIS — I6523 Occlusion and stenosis of bilateral carotid arteries: Secondary | ICD-10-CM

## 2021-03-10 DIAGNOSIS — I25110 Atherosclerotic heart disease of native coronary artery with unstable angina pectoris: Secondary | ICD-10-CM

## 2021-03-10 DIAGNOSIS — R001 Bradycardia, unspecified: Secondary | ICD-10-CM

## 2021-03-10 DIAGNOSIS — I4891 Unspecified atrial fibrillation: Secondary | ICD-10-CM | POA: Diagnosis not present

## 2021-03-10 DIAGNOSIS — I951 Orthostatic hypotension: Secondary | ICD-10-CM

## 2021-03-10 DIAGNOSIS — I10 Essential (primary) hypertension: Secondary | ICD-10-CM

## 2021-03-10 DIAGNOSIS — Z79899 Other long term (current) drug therapy: Secondary | ICD-10-CM

## 2021-03-10 MED ORDER — FUROSEMIDE 20 MG PO TABS: 20.0000 mg | ORAL_TABLET | Freq: Every day | ORAL | 3 refills | Status: DC

## 2021-03-10 NOTE — Telephone Encounter (Signed)
Patient wife calling back states [employer] will not cover cost, so she will try [employer]

## 2021-03-10 NOTE — Patient Instructions (Addendum)
Medication Instructions:   Your physician has recommended you make the following change in your medication:   1) START Furosemide (Lasix) 20 mg daily   2) Your provider recommends that you purchase Thigh sleeves/binders and Abdominal binder for orthostatic hypotension    - Wear thigh and abdominal binders during the day and remove before you go to bed.  *If you need a refill on your cardiac medications before your next appointment, please call your pharmacy*   Lab Work:  Your physician recommends that you return for lab work in: [place] (BMET)   - This lab does not require fasting  Testing/Procedures:  None ordered   Follow-Up: At [facility], you and your health needs are our priority.  As part of our continuing mission to provide you with exceptional heart care, we have created designated Provider Care Teams.  These Care Teams include your primary Cardiologist (physician) and Advanced Practice Providers (APPs -  Physician Assistants and Nurse Practitioners) who all work together to provide you with the care you need, when you need it.  We recommend signing up for the patient portal called "MyChart".  Sign up information is provided on this After Visit Summary.  MyChart is used to connect with patients for Virtual Visits (Telemedicine).  Patients are able to view lab/test results, encounter notes, upcoming appointments, etc.  Non-urgent messages can be sent to your provider as well.   To learn more about what you can do with MyChart, go to [email or web address].    Your next appointment:   3 month(s)  The format for your next appointment:   In Person  Provider:   You may see Kathlyn Sacramento, MD or one of the following Advanced Practice Providers on your designated Care Team:   Murray Hodgkins, NP Christell Faith, PA-C Cadence Kathlen Mody, PA-C}    Other Instructions  1) You have been referred to Electrophysiology   2) Please take weights daily.  Call clinic with any weight  gain of greater than 3 pounds in day or 5 pounds in a week. Call for lower extremity swelling or SOB related to fluid volume over load including sleeping on more pillows or sitting up.    How to use a home scale Place the scale on a flat surface without carpet. Use a calendar, notebook paper, or log to track your weights and keep it by your scale. Each morning step on the scale after you empty your bladder. Weigh before eating or drinking. Always weigh in the same clothing or no clothing. Record your weight every day. Weighing twice a day is not necessary. If you choose to weigh in the morning and evening make sure your doctor knows which weights you are talking about. Bring journal to each office visit.

## 2021-03-10 NOTE — Telephone Encounter (Signed)
Spoke with pt's wife.  Pt was instructed at office visit today to start wearing abdominal binder and thigh sleeves for orthostatic hypotension.  Pt's wife states that her daughter is going to help her find this on [place] and understands there are affordable selections to order this route.  Pt's wife states that she was going to call insurance company to ask if they would cover if an order was written.  After discussing ordering on [place] with her daughter, she is considering ordering this.  Pt's wife will let us know if for any reason she chooses to try to purchase at a supply company using insurance.  She feels confident that she will be able to order and will only let us know if requires an order.  No further needs at this time.

## 2021-03-10 NOTE — Telephone Encounter (Signed)
Patient's wife is calling and asks if there is a prescription written for his abdominal binder and thigh sleeves if insurance will pay. Please call to discuss.

## 2021-03-10 NOTE — Telephone Encounter (Signed)
Noted  

## 2021-03-10 NOTE — Progress Notes (Deleted)
Cardiology Office Note:    Date:  03/10/2021   ID:  Nathaniel Thompson, DOB June 10, 1935, MRN [id number]  PCP:  Einar Pheasant, MD  [facility] Cardiologist:  Kathlyn Sacramento, MD  [facility] Electrophysiologist:  None   Referring MD: Einar Pheasant, MD   Chief Complaint: ***  History of Present Illness:    Nathaniel Thompson is a 86 y.o. male with a hx of ***  Past Medical History:  Diagnosis Date   (HFpEF) heart failure with preserved ejection fraction ([place])    a. 05/2018 Echo: Nl EF, Gr1 DD, sev dil LA, mild MR/TR, mild PAH; b. 05/2019 Echo: EF 55-60%, no rwma, mild LVH. Nl PASP. Mildly dil LA. Triv MR. Mild AoV sclerosis w/o stenosis. Mildly dil Asc AO (19mm).   Allergic rhinitis    Barrett's esophagus 10/21/2013   Bone cancer (HCC)    CKD (chronic kidney disease), stage III (HCC)    Colon polyp, hyperplastic    COPD (chronic obstructive pulmonary disease) (HCC)    mild COPD. former smoker   Coronary artery disease    a. 2003 s/p PCI ([facility]); b. 2004 s/p PCI x 2 ([facility]); c. 11/2017 Cath: LM nl, LAD mild dzs, LCX patent stent w/ 30% distal edge restenosis, RCA patent distal stent w/ 30% prox edge stenosis. Nl EF->Med Rx;   Diverticulosis    Fundic gland polyps of stomach, benign    Gastritis    GERD (gastroesophageal reflux disease)    Hypercholesteremia    Hypertension    Prostate cancer ([place])    Prostatism    Reflux esophagitis    Renal stones    Skin cancer    left cheek/lesion excised   Sleep apnea    Tachycardia    a. 11/2017 admit w/ tachycardia/? afib-->no afib noted upon review (amio/OAC d/c'd).   TIA (transient ischemic attack)     Past Surgical History:  Procedure Laterality Date   CARDIAC CATHETERIZATION     COLONOSCOPY WITH PROPOFOL N/A 09/13/2015   Procedure: COLONOSCOPY WITH PROPOFOL;  Surgeon: Lollie Sails, MD;  Location: [facility] ENDOSCOPY;  Service: Endoscopy;  Laterality: N/A;   COLONOSCOPY WITH PROPOFOL N/A 05/07/2019   Procedure: COLONOSCOPY WITH  PROPOFOL;  Surgeon: Robert Bellow, MD;  Location: [facility] ENDOSCOPY;  Service: Endoscopy;  Laterality: N/A;   CORONARY ANGIOPLASTY     ESOPHAGOGASTRODUODENOSCOPY (EGD) WITH PROPOFOL N/A 09/13/2015   Procedure: ESOPHAGOGASTRODUODENOSCOPY (EGD) WITH PROPOFOL;  Surgeon: Lollie Sails, MD;  Location: [facility] ENDOSCOPY;  Service: Endoscopy;  Laterality: N/A;   ESOPHAGOGASTRODUODENOSCOPY (EGD) WITH PROPOFOL N/A 12/28/2015   Procedure: ESOPHAGOGASTRODUODENOSCOPY (EGD) WITH PROPOFOL;  Surgeon: Lollie Sails, MD;  Location: [facility] ENDOSCOPY;  Service: Endoscopy;  Laterality: N/A;   ESOPHAGOGASTRODUODENOSCOPY (EGD) WITH PROPOFOL N/A 06/16/2016   Procedure: ESOPHAGOGASTRODUODENOSCOPY (EGD) WITH PROPOFOL;  Surgeon: Lollie Sails, MD;  Location: [facility] ENDOSCOPY;  Service: Endoscopy;  Laterality: N/A;   ESOPHAGOGASTRODUODENOSCOPY (EGD) WITH PROPOFOL N/A 05/07/2019   Procedure: ESOPHAGOGASTRODUODENOSCOPY (EGD) WITH PROPOFOL;  Surgeon: Robert Bellow, MD;  Location: [facility] ENDOSCOPY;  Service: Endoscopy;  Laterality: N/A;   EYE SURGERY  2013   CATARACT EXTRACTION   GANGLION CYST EXCISION Right 05/18/2017   Procedure: REMOVAL GANGLION CYST ANKLE;  Surgeon: Samara Deist, DPM;  Location: [facility] ORS;  Service: Podiatry;  Laterality: Right;   heart cath stent     LEFT HEART CATH AND CORONARY ANGIOGRAPHY Right 12/03/2017   Procedure: Left Heart Cath and Coronary Angiography with possible coronary intervention;  Surgeon: Dionisio David, MD;  Location: [place] CV LAB;  Service: Cardiovascular;  Laterality: Right;   PROSTATE BIOPSY     RIGHT/LEFT HEART CATH AND CORONARY ANGIOGRAPHY N/A 02/07/2021   Procedure: RIGHT/LEFT HEART CATH AND CORONARY ANGIOGRAPHY;  Surgeon: [name], MD;  Location: [place] CV LAB;  Service: Cardiovascular;  Laterality: N/A;   stents     multiple    Current Medications: Current Meds  Medication Sig   amLODipine (NORVASC) 10 MG tablet Take 5 mg by mouth daily.    aspirin EC 81 MG tablet Take 81 mg by mouth daily.   azelastine (ASTELIN) 0.1 % nasal spray Place 1 spray into both nostrils as needed for rhinitis. Use in each nostril as directed   Calcium Carb-Cholecalciferol (CALCIUM + D3 PO) Take 1 tablet by mouth daily.   cholecalciferol (VITAMIN D) 1000 units tablet Take 1,000 Units by mouth daily.    clopidogrel (PLAVIX) 75 MG tablet TAKE 1 TABLET BY MOUTH ONCE DAILY   ferrous sulfate 324 MG TBEC Take 324 mg by mouth daily.   fexofenadine (ALLEGRA) 180 MG tablet Take 180 mg by mouth daily as needed for allergies or rhinitis.    fluticasone (FLONASE) 50 MCG/ACT nasal spray Place 2 sprays into both nostrils daily as needed for allergies.   furosemide (LASIX) 20 MG tablet Take 1 tablet (20 mg total) by mouth daily.   isosorbide mononitrate (IMDUR) 30 MG 24 hr tablet TAKE 1 TABLET BY MOUTH ONCE DAILY   losartan (COZAAR) 50 MG tablet Take 1 tablet (50 mg total) by mouth daily.   lovastatin (MEVACOR) 40 MG tablet TAKE 2 TABLETS BY MOUTH AT BEDTIME   Multiple Vitamin (MULTI-VITAMINS) TABS Take 1 tablet by mouth daily.    pantoprazole (PROTONIX) 40 MG tablet Take 40 mg by mouth 2 (two) times daily.      Allergies:   Atorvastatin   Social History   Socioeconomic History   Marital status: Married    Spouse name: Not on file   Number of children: Not on file   Years of education: Not on file   Highest education level: Not on file  Occupational History   Not on file  Tobacco Use   Smoking status: Former    Packs/day: 1.00    Years: 35.00    Pack years: 35.00    Types: Cigarettes    Quit date: 03/06/1986    Years since quitting: 35.0   Smokeless tobacco: Former    Types: Nurse, children's Use: Never used  Substance and Sexual Activity   Alcohol use: No   Drug use: No   Sexual activity: Not Currently  Other Topics Concern   Not on file  Social History Narrative   Not on file   Social Determinants of Health   Financial Resource  Strain: Low Risk    Difficulty of Paying Living Expenses: Not hard at all  Food Insecurity: No Food Insecurity   Worried About Charity fundraiser in the Last Year: Never true   [place] in the Last Year: Never true  Transportation Needs: No Transportation Needs   Lack of Transportation (Medical): No   Lack of Transportation (Non-Medical): No  Physical Activity: Unknown   Days of Exercise per Week: 0 days   Minutes of Exercise per Session: Not on file  Stress: No Stress Concern Present   Feeling of Stress : Not at all  Social Connections: Unknown   Frequency of Communication with Friends and  Family: Not on file   Frequency of Social Gatherings with Friends and Family: Not on file   Attends Religious Services: Not on file   Active Member of Clubs or Organizations: Not on file   Attends Archivist Meetings: Not on file   Marital Status: Married     Family History: The patient's ***family history includes Cancer in his brother, brother, daughter, father, mother, sister, and another family member; Stroke in his father.  ROS:   Please see the history of present illness.    *** All other systems reviewed and are negative.  EKGs/Labs/Other Studies Reviewed:    The following studies were reviewed today: ***  EKG:  EKG is *** ordered today.  The ekg ordered today demonstrates ***  Recent Labs: 06/17/2020: TSH 2.15 11/19/2020: ALT 15 02/04/2021: BUN 18; Creatinine, Ser 1.30; Hemoglobin 12.9; Platelets 205; Potassium 4.7; Sodium 141  Recent Lipid Panel    Component Value Date/Time   CHOL 128 10/25/2020 0841   CHOL 103 09/16/2013 0438   TRIG 166.0 (H) 10/25/2020 0841   TRIG 95 09/16/2013 0438   HDL 32.20 (L) 10/25/2020 0841   HDL 30 (L) 09/16/2013 0438   CHOLHDL 4 10/25/2020 0841   VLDL 33.2 10/25/2020 0841   VLDL 19 09/16/2013 0438   LDLCALC 63 10/25/2020 0841   LDLCALC 54 09/16/2013 0438     Risk Assessment/Calculations:   {Does this patient have ATRIAL  FIBRILLATION?:[phone]}   Physical Exam:    VS:  BP 130/62 (BP Location: Left Arm, Patient Position: Sitting, Cuff Size: Normal)    Pulse 64    Ht 5\' 10"  (1.778 m)    Wt 224 lb (101.6 kg)    BMI 32.14 kg/m     Wt Readings from Last 3 Encounters:  03/10/21 224 lb (101.6 kg)  02/07/21 220 lb (99.8 kg)  02/04/21 220 lb (99.8 kg)     GEN: *** Well nourished, well developed in no acute distress HEENT: Normal NECK: No JVD; No carotid bruits LYMPHATICS: No lymphadenopathy CARDIAC: ***RRR, no murmurs, rubs, gallops RESPIRATORY:  Clear to auscultation without rales, wheezing or rhonchi  ABDOMEN: Soft, non-tender, non-distended MUSCULOSKELETAL:  No edema; No deformity  SKIN: Warm and dry NEUROLOGIC:  Alert and oriented x 3 PSYCHIATRIC:  Normal affect   ASSESSMENT:    1. Coronary artery disease involving native coronary artery of native heart without angina pectoris   2. Coronary artery disease involving native coronary artery of native heart with unstable angina pectoris ([place])   3. Atypical chest pain   4. Bradycardia   5. SVT (supraventricular tachycardia) ([place])   6. Atrial fibrillation, unspecified type ([place])   7. Orthostatic hypotension   8. Medication management   9. Chronic diastolic heart failure ([place])   10. Chronic heart failure with preserved ejection fraction (HCC)   11. Essential hypertension   12. Bilateral carotid artery stenosis   13. Abdominal aortic aneurysm (AAA) without rupture, unspecified part    PLAN:    In order of problems listed above:  ***  Disposition: Follow up {follow up:15908} with ***   Shared Decision Making/Informed Consent   {Are you ordering a CV Procedure (e.g. stress test, cath, DCCV, TEE, etc)?   Press F2        :[id number]}    Signed, Dariya Gainer Ninfa Meeker, PA-C  03/10/2021 4:48 PM    [facility]

## 2021-03-15 ENCOUNTER — Emergency Department
Admission: EM | Admit: 2021-03-15 | Discharge: 2021-03-15 | Disposition: A | Discharge status: Home / Self Care | Payer: Medicare HMO | Attending: Emergency Medicine | Admitting: Emergency Medicine

## 2021-03-15 ENCOUNTER — Emergency Department: Payer: Medicare HMO

## 2021-03-15 ENCOUNTER — Other Ambulatory Visit: Payer: Self-pay

## 2021-03-15 DIAGNOSIS — N189 Chronic kidney disease, unspecified: Secondary | ICD-10-CM | POA: Diagnosis not present

## 2021-03-15 DIAGNOSIS — I509 Heart failure, unspecified: Secondary | ICD-10-CM | POA: Diagnosis not present

## 2021-03-15 DIAGNOSIS — I130 Hypertensive heart and chronic kidney disease with heart failure and stage 1 through stage 4 chronic kidney disease, or unspecified chronic kidney disease: Secondary | ICD-10-CM | POA: Insufficient documentation

## 2021-03-15 DIAGNOSIS — R0601 Orthopnea: Secondary | ICD-10-CM | POA: Diagnosis not present

## 2021-03-15 DIAGNOSIS — R42 Dizziness and giddiness: Secondary | ICD-10-CM | POA: Diagnosis not present

## 2021-03-15 DIAGNOSIS — R0602 Shortness of breath: Secondary | ICD-10-CM | POA: Insufficient documentation

## 2021-03-15 DIAGNOSIS — Z20822 Contact with and (suspected) exposure to covid-19: Secondary | ICD-10-CM | POA: Diagnosis not present

## 2021-03-15 DIAGNOSIS — I10 Essential (primary) hypertension: Secondary | ICD-10-CM

## 2021-03-15 DIAGNOSIS — R059 Cough, unspecified: Secondary | ICD-10-CM | POA: Insufficient documentation

## 2021-03-15 DIAGNOSIS — R0789 Other chest pain: Secondary | ICD-10-CM | POA: Insufficient documentation

## 2021-03-15 DIAGNOSIS — R531 Weakness: Secondary | ICD-10-CM

## 2021-03-15 LAB — RESP PANEL BY RT-PCR (FLU A&B, COVID) ARPGX2
Influenza A by PCR: NEGATIVE
Influenza B by PCR: NEGATIVE
SARS Coronavirus 2 by RT PCR: NEGATIVE

## 2021-03-15 LAB — CBC
HCT: 37.6 % — ABNORMAL LOW (ref 39.0–52.0)
Hemoglobin: 12.5 g/dL — ABNORMAL LOW (ref 13.0–17.0)
MCH: 29.6 pg (ref 26.0–34.0)
MCHC: 33.2 g/dL (ref 30.0–36.0)
MCV: 88.9 fL (ref 80.0–100.0)
Platelets: 195 10*3/uL (ref 150–400)
RBC: 4.23 MIL/uL (ref 4.22–5.81)
RDW: 13 % (ref 11.5–15.5)
WBC: 5.3 10*3/uL (ref 4.0–10.5)
nRBC: 0 % (ref 0.0–0.2)

## 2021-03-15 LAB — BASIC METABOLIC PANEL
Anion gap: 6 (ref 5–15)
BUN: 21 mg/dL (ref 8–23)
CO2: 28 mmol/L (ref 22–32)
Calcium: 9.4 mg/dL (ref 8.9–10.3)
Chloride: 105 mmol/L (ref 98–111)
Creatinine, Ser: 1.41 mg/dL — ABNORMAL HIGH (ref 0.61–1.24)
GFR, Estimated: 49 mL/min — ABNORMAL LOW (ref >60)
Glucose, Bld: 110 mg/dL — ABNORMAL HIGH (ref 70–99)
Potassium: 4 mmol/L (ref 3.5–5.1)
Sodium: 139 mmol/L (ref 135–145)

## 2021-03-15 LAB — BRAIN NATRIURETIC PEPTIDE: B Natriuretic Peptide: 114.6 pg/mL — ABNORMAL HIGH (ref 0.0–100.0)

## 2021-03-15 LAB — TROPONIN I (HIGH SENSITIVITY)
Troponin I (High Sensitivity): 14 ng/L (ref <18)
Troponin I (High Sensitivity): 13 ng/L (ref <18)

## 2021-03-15 MED ORDER — FUROSEMIDE 10 MG/ML IJ SOLN
20.0000 mg | Freq: Once | INTRAMUSCULAR | Status: AC
  Administered 2021-03-15: 20 mg via INTRAVENOUS
  Filled 2021-03-15: qty 4

## 2021-03-15 NOTE — Discharge Instructions (Addendum)
Follow up with cardiology this week.  Return for any additional questions or concerns.

## 2021-03-15 NOTE — ED Triage Notes (Incomplete)
Patient from home for shortness of breath tonight; can not tolerate lying flat when sleeping; due to have pacemaker placed this coming Thursday; A/O x 4; skin warm/ dry; respirations tachypneic

## 2021-03-15 NOTE — ED Notes (Signed)
All questions answered, follow up pcp

## 2021-03-15 NOTE — Consult Note (Signed)
Cardiology Consultation:   Patient ID: Nathaniel Thompson; [id number]; 1935-06-23   Admit date: 03/15/2021 Date of Consult: 03/15/2021  Primary Care Provider: Einar Pheasant, MD Primary Cardiologist: Fletcher Anon Primary Electrophysiologist:  None   Patient Profile:   Nathaniel Thompson is a 86 y.o. male with a hx of CAD status post stenting to the LCx and RCA, A. fib, PSVT, NSVT, junctional rhythm, metastatic prostate cancer, small AAA followed by vascular surgery stable on imaging in 01/2021, TIA, HTN, HLD, and prior tobacco use quitting in the 1980s who is being seen today for the evaluation of dizziness at the request of Dr. Quentin Cornwall.  History of Present Illness:   [name] had PCI done at [facility] entry 2003 with repeat PCI at [facility] in 2004.  LHC in 2019 showed patent LCx stent with 30% distal edge restenosis, mild LAD disease, patent distal RCA stent with 30% proximal stenosis.  EF was normal.  He was admitted in 11/2017 with questionable tachycardia and A. fib.  Initially, he was placed on amiodarone and anticoagulation, but all his EKGs revealed sinus rhythm at that time and thus these medications were discontinued.  Echo in 05/2018 demonstrated normal LV systolic function, grade 1 diastolic dysfunction, severely dilated left atrium, mild mitral and tricuspid regurgitation, and mild pulmonary hypertension.  He was seen in the ED in the fall 2022 with dizziness with reassuring work-up including CT head.  Carotid artery ultrasound in 01/2021 showed bilateral 1 to 39% ICA stenoses with antegrade flow of the bilateral vertebral arteries.  Due to intermittent episodes of substernal chest pain and tightness that were occurring with exertion and at rest during nighttime hours with associated increase shortness of breath and orthopnea he underwent diagnostic R/LHC on 02/07/2021 which demonstrated patent stents in the LCx and RCA with no evidence of obstructive CAD.  RHC demonstrated mildly elevated  filling pressures, mild pulmonary hypertension, and normal cardiac output.  Normal LV systolic function.  During the case, he was noted to have intermittent bradycardia with a heart rate down into the 40s bpm.  Given this, he underwent outpatient cardiac monitoring which demonstrated a predominant rhythm of sinus with an average rate of 58 bpm (range 42 to 203 bpm), 1 run of NSVT lasting 6 beats with a maximal rate of 152 bpm, 103 episodes of SVT with the fastest interval lasting 6 beats with a maximal rate of 203 bpm and the longest interval lasting 16.7 seconds with an average rate of 93 bpm.  A. fib was noted with a less than 1% burden with the longest episode lasting 5 minutes and 51 seconds.  Junctional rhythm was noted.  Patient detected events were associated with junctional rhythm and A. fib.  Given these findings, he was referred to EP and has an appointment scheduled with him for 03/17/2021.  He presented to the ED on the morning of 1/10 with dizziness and orthopnea.  No chest pain, palpitations, lower extremity swelling, abdominal distention, or early satiety.  He was hypertensive upon arrival with a BP of 197/74.  Chest x-ray showed no active cardiopulmonary disease.  High-sensitivity troponin negative x2.  BNP 114.  COVID and influenza negative.  Hgb 12.5.  WBC 5.3.  BUN 21.  Serum creatinine 1.41 with a baseline around 1.1-1.3.  Potassium 4.0.  On telemetry, he was noted to be in sinus bradycardia with a heart rate predominantly in the 50s bpm with a low heart rate of 47 bpm.  Upon cardiology evaluation in  the ED he reported feeling nearly back to his baseline.  However, when we laid him back on the exam bed he became severely dizzy with associated vertigo-like symptoms.  When we sat him back up the symptoms improved.  He did not note significant orthopnea with this.  It was recommended the patient receive IV Lasix 20 mg and ambulate to assess respiratory status and chronotropic competence.  In the  ED, the patient continued to feel well and was discharged to outpatient cardiology follow-up.   Past Medical History:  Diagnosis Date   (HFpEF) heart failure with preserved ejection fraction ([place])    a. 05/2018 Echo: Nl EF, Gr1 DD, sev dil LA, mild MR/TR, mild PAH; b. 05/2019 Echo: EF 55-60%, no rwma, mild LVH. Nl PASP. Mildly dil LA. Triv MR. Mild AoV sclerosis w/o stenosis. Mildly dil Asc AO (16m).   Allergic rhinitis    Barrett's esophagus 10/21/2013   Bone cancer (HCC)    CKD (chronic kidney disease), stage III (HCC)    Colon polyp, hyperplastic    COPD (chronic obstructive pulmonary disease) (HCC)    mild COPD. former smoker   Coronary artery disease    a. 2003 s/p PCI ([facility]); b. 2004 s/p PCI x 2 ([facility]); c. 11/2017 Cath: LM nl, LAD mild dzs, LCX patent stent w/ 30% distal edge restenosis, RCA patent distal stent w/ 30% prox edge stenosis. Nl EF->Med Rx;   Diverticulosis    Fundic gland polyps of stomach, benign    Gastritis    GERD (gastroesophageal reflux disease)    Hypercholesteremia    Hypertension    Prostate cancer ([place]    Prostatism    Reflux esophagitis    Renal stones    Skin cancer    left cheek/lesion excised   Sleep apnea    Tachycardia    a. 11/2017 admit w/ tachycardia/? afib-->no afib noted upon review (amio/OAC d/c'd).   TIA (transient ischemic attack)     Past Surgical History:  Procedure Laterality Date   CARDIAC CATHETERIZATION     COLONOSCOPY WITH PROPOFOL N/A 09/13/2015   Procedure: COLONOSCOPY WITH PROPOFOL;  Surgeon: MLollie Sails MD;  [facility];  Service: Endoscopy;  Laterality: N/A;   COLONOSCOPY WITH PROPOFOL N/A 05/07/2019   Procedure: COLONOSCOPY WITH PROPOFOL;  Surgeon: BRobert Bellow MD;  Location: [facility] ENDOSCOPY;  Service: Endoscopy;  Laterality: N/A;   CORONARY ANGIOPLASTY     ESOPHAGOGASTRODUODENOSCOPY (EGD) WITH PROPOFOL N/A 09/13/2015   Procedure: ESOPHAGOGASTRODUODENOSCOPY (EGD) WITH PROPOFOL;  Surgeon: MLollie Sails MD;  [facility]  Service: Endoscopy;  Laterality: N/A;   ESOPHAGOGASTRODUODENOSCOPY (EGD) WITH PROPOFOL N/A 12/28/2015   Procedure: ESOPHAGOGASTRODUODENOSCOPY (EGD) WITH PROPOFOL;  Surgeon: MLollie Sails MD;  [facility];  Service: Endoscopy;  Laterality: N/A;   ESOPHAGOGASTRODUODENOSCOPY (EGD) WITH PROPOFOL N/A 06/16/2016   Procedure: ESOPHAGOGASTRODUODENOSCOPY (EGD) WITH PROPOFOL;  Surgeon: MLollie Sails MD;  [facility];  Service: Endoscopy;  Laterality: N/A;   ESOPHAGOGASTRODUODENOSCOPY (EGD) WITH PROPOFOL N/A 05/07/2019   Procedure: ESOPHAGOGASTRODUODENOSCOPY (EGD) WITH PROPOFOL;  Surgeon: BRobert Bellow MD;  Location: [facility] ENDOSCOPY;  Service: Endoscopy;  Laterality: N/A;   EYE SURGERY  2013   CATARACT EXTRACTION   GANGLION CYST EXCISION Right 05/18/2017   Procedure: REMOVAL GANGLION CYST ANKLE;  Surgeon: FSamara Deist DPM;  Location: [facility] ORS;  Service: Podiatry;  Laterality: Right;   heart cath stent     LEFT HEART CATH AND CORONARY ANGIOGRAPHY Right 12/03/2017   Procedure: Left Heart  Cath and Coronary Angiography with possible coronary intervention;  Surgeon: Dionisio David, MD;  Location: [place] CV LAB;  Service: Cardiovascular;  Laterality: Right;   PROSTATE BIOPSY     RIGHT/LEFT HEART CATH AND CORONARY ANGIOGRAPHY N/A 02/07/2021   Procedure: RIGHT/LEFT HEART CATH AND CORONARY ANGIOGRAPHY;  Surgeon: [name], MD;  Location: [place] CV LAB;  Service: Cardiovascular;  Laterality: N/A;   stents     multiple     Home Meds: Prior to Admission medications   Medication Sig Start Date End Date Taking? Authorizing Provider  amLODipine (NORVASC) 10 MG tablet Take 5 mg by mouth daily.    [provider]  aspirin EC 81 MG tablet Take 81 mg by mouth daily.    [provider]  azelastine (ASTELIN) 0.1 % nasal spray Place 1 spray into both nostrils as needed for rhinitis. Use in each nostril  as directed    [provider]  Calcium Carb-Cholecalciferol (CALCIUM + D3 PO) Take 1 tablet by mouth daily.    [provider]  cholecalciferol (VITAMIN D) 1000 units tablet Take 1,000 Units by mouth daily.     [provider]  clopidogrel (PLAVIX) 75 MG tablet TAKE 1 TABLET BY MOUTH ONCE DAILY 02/14/21   [name], MD  ferrous sulfate 324 MG TBEC Take 324 mg by mouth daily.    [provider]  fexofenadine (ALLEGRA) 180 MG tablet Take 180 mg by mouth daily as needed for allergies or rhinitis.     [provider]  fluticasone (FLONASE) 50 MCG/ACT nasal spray Place 2 sprays into both nostrils daily as needed for allergies.    [provider]  furosemide (LASIX) 20 MG tablet Take 1 tablet (20 mg total) by mouth daily. 03/10/21 07/08/21  Furth, Cadence H, PA-C  isosorbide mononitrate (IMDUR) 30 MG 24 hr tablet TAKE 1 TABLET BY MOUTH ONCE DAILY 10/12/20   Einar Pheasant, MD  losartan (COZAAR) 50 MG tablet Take 1 tablet (50 mg total) by mouth daily. 01/20/21   [name], MD  lovastatin (MEVACOR) 40 MG tablet TAKE 2 TABLETS BY MOUTH AT BEDTIME 01/17/21   Einar Pheasant, MD  Multiple Vitamin (MULTI-VITAMINS) TABS Take 1 tablet by mouth daily.     [provider]  pantoprazole (PROTONIX) 40 MG tablet Take 40 mg by mouth 2 (two) times daily.     [provider]    Inpatient Medications: Scheduled Meds:  Continuous Infusions:  PRN Meds:   Allergies:   Allergies  Allergen Reactions   Atorvastatin Other (See Comments)    Achy joints    Social History:   Social History   Socioeconomic History   Marital status: Married    Spouse name: Not on file   Number of children: Not on file   Years of education: Not on file   Highest education level: Not on file  Occupational History   Not on file  Tobacco Use   Smoking status: Former    Packs/day: 1.00    Years: 35.00    Pack years: 35.00    Types: Cigarettes     Quit date: 03/06/1986    Years since quitting: 35.0   Smokeless tobacco: Former    Types: Nurse, children's Use: Never used  Substance and Sexual Activity   Alcohol use: No   Drug use: No   Sexual activity: Not Currently  Other Topics Concern   Not on file  Social  History Narrative   Not on file   Social Determinants of Health   Financial Resource Strain: Low Risk    Difficulty of Paying Living Expenses: Not hard at all  Food Insecurity: No Food Insecurity   Worried About Charity fundraiser in the Last Year: Never true   [place] in the Last Year: Never true  Transportation Needs: No Transportation Needs   Lack of Transportation (Medical): No   Lack of Transportation (Non-Medical): No  Physical Activity: Unknown   Days of Exercise per Week: 0 days   Minutes of Exercise per Session: Not on file  Stress: No Stress Concern Present   Feeling of Stress : Not at all  Social Connections: Unknown   Frequency of Communication with Friends and Family: Not on file   Frequency of Social Gatherings with Friends and Family: Not on file   Attends Religious Services: Not on Electrical engineer or Organizations: Not on file   Attends Archivist Meetings: Not on file   Marital Status: Married  Human resources officer Violence: Not At Risk   Fear of Current or Ex-Partner: No   Emotionally Abused: No   Physically Abused: No   Sexually Abused: No     Family History:   Family History  Problem Relation Age of Onset   Cancer Mother        gastric and lung   Cancer Father        multiple myeloma   Stroke Father    Cancer Sister        leukemia   Cancer Brother        leukemia   Cancer Brother        kidney   Cancer Daughter        Uterine   Cancer Other        Nephes (Sister's Son): Prostate    ROS:  Review of Systems  Constitutional:  Positive for malaise/fatigue. Negative for chills, diaphoresis, fever and weight loss.  HENT:  Negative for  congestion.   Eyes:  Negative for discharge and redness.  Respiratory:  Positive for shortness of breath. Negative for cough, sputum production and wheezing.   Cardiovascular:  Positive for orthopnea. Negative for chest pain, palpitations, claudication, leg swelling and PND.  Gastrointestinal:  Negative for abdominal pain, blood in stool, heartburn, melena, nausea and vomiting.  Musculoskeletal:  Negative for falls and myalgias.  Skin:  Negative for rash.  Neurological:  Positive for dizziness and weakness. Negative for tingling, tremors, sensory change, speech change, focal weakness and loss of consciousness.  Endo/Heme/Allergies:  Does not bruise/bleed easily.  Psychiatric/Behavioral:  Negative for substance abuse. The patient is not nervous/anxious.   All other systems reviewed and are negative.    Physical Exam/Data:   Vitals:   03/15/21 0645 03/15/21 0700 03/15/21 0855 03/15/21 1029  BP: (!) 143/67 (!) 159/75 (!) 163/74 (!) 155/74  Pulse: (!) 53 (!) 48 60 65  Resp: '20 11 19 18  ' Temp:   98.8 F (37.1 C) 98.5 F (36.9 C)  TempSrc:    Oral  SpO2: 96% 98% 99% 96%  Weight:      Height:       No intake or output data in the 24 hours ending 03/15/21 1345 Filed Weights   03/15/21 0507  Weight: 102 kg   Body mass index is 32.27 kg/m.   Physical Exam: General: Well developed, well nourished, in no acute distress. Head: Normocephalic,  atraumatic, sclera non-icteric, no xanthomas, nares without discharge.  Neck: Negative for carotid bruits. JVD not elevated. Lungs: Clear bilaterally to auscultation without wheezes, rales, or rhonchi. Breathing is unlabored. Heart: RRR with S1 S2. No murmurs, rubs, or gallops appreciated. Abdomen: Soft, non-tender, non-distended with normoactive bowel sounds. No hepatomegaly. No rebound/guarding. No obvious abdominal masses. Msk:  Strength and tone appear normal for age. Extremities: No clubbing or cyanosis. No edema. Distal pedal pulses are 2+ and  equal bilaterally. Neuro: Alert and oriented X 3. No facial asymmetry. No focal deficit. Moves all extremities spontaneously. Psych:  Responds to questions appropriately with a normal affect.   EKG:  The EKG was personally reviewed and demonstrates: NSR, 71 bpm, PVCs, baseline artifact, nonspecific ST-T changes Telemetry:  Telemetry was personally reviewed and demonstrates: Sinus bradycardia with heart rates predominantly in the 50s bpm  Weights: Filed Weights   03/15/21 0507  Weight: 102 kg    Relevant CV Studies:  Zio patch 02/2021: Patient had a min HR of 42 bpm, max HR of 203 bpm, and avg HR of 58 bpm.  Predominant underlying rhythm was Sinus Rhythm.Slight P wave morphology changes were noted.1 run of Ventricular Tachycardia occurred lasting 6 beats with a max rate of 152 bpm (avg 138 bpm).  103 Supraventricular Tachycardia runs occurred, the run with the fastest interval lasting 6 beats with a max rate of 203 bpm, the longest lasting 16.7 secs with an avg rate of 93 bpm. Atrial Fibrillation occurred (<1% burden), ranging from  103-167 bpm (avg of 133 bpm), the longest lasting 5 mins 51 secs with an avg rate of 139 bpm. Junctional Rhythm was present. Atrial Fibrillation and Junctional Rhythm were detected within +/- 45 seconds of symptomatic patient event(s).  Rare PACs and occasional PVCs. __________  [facility] 02/07/2021:   Prox Cx to Mid Cx lesion is 10% stenosed.   Mid Cx lesion is 30% stenosed.   Mid RCA to Dist RCA lesion is 5% stenosed.   The left ventricular systolic function is normal.   LV end diastolic pressure is mildly elevated.   The left ventricular ejection fraction is 55-65% by visual estimate.   1.  Patent stents in the left circumflex and right coronary artery with no evidence of obstructive coronary artery disease. 2.  Right heart catheterization showed mildly elevated filling pressures, mild pulmonary hypertension and normal cardiac output. 3.  Normal left  ventricular systolic function.   Recommendations: No culprit is identified for the patient's chest pain.  Suspect possible GI etiology.  The patient is mildly volume overloaded likely due to some degree of diastolic heart failure.  A small dose diuretic can be considered.   The patient was noted to have intermittent bradycardia during cardiac cath with a heart rate down to the low 40s.  He reports recent episodes of dizziness and presyncope and thus we will obtain a 2-week outpatient monitor to evaluate the need for a pacemaker. __________  2D echo 05/2019: 1. Left ventricular ejection fraction, by estimation, is 55 to 60%. The  left ventricle has normal function. The left ventricle has no regional  wall motion abnormalities. There is mild left ventricular hypertrophy.  Left ventricular diastolic parameters  were normal.   2. Right ventricular systolic function is normal. The right ventricular  size is normal. There is normal pulmonary artery systolic pressure.   3. Left atrial size was mildly dilated.   4. The mitral valve is normal in structure. Trivial mitral valve  regurgitation. No evidence  of mitral stenosis.   5. The aortic valve is normal in structure. Aortic valve regurgitation is  not visualized. Mild aortic valve sclerosis is present, with no evidence  of aortic valve stenosis.   6. Aortic dilatation noted. There is mild dilatation of the ascending  aorta measuring 39 mm.   7. The inferior vena cava is normal in size with greater than 50%  respiratory variability, suggesting right atrial pressure of 3 mmHg.   Comparison(s): Prior Echo showed LV EF 55%, no RWMA, grade I diastolic  dysfunction, and mild LAE. Mild MR/TR.    Laboratory Data:  Chemistry Recent Labs  Lab 03/15/21 0446  NA 139  K 4.0  CL 105  CO2 28  GLUCOSE 110*  BUN 21  CREATININE 1.41*  CALCIUM 9.4  GFRNONAA 49*  ANIONGAP 6    No results for input(s): PROT, ALBUMIN, AST, ALT, ALKPHOS, BILITOT in  the last 168 hours. Hematology Recent Labs  Lab 03/15/21 0446  WBC 5.3  RBC 4.23  HGB 12.5*  HCT 37.6*  MCV 88.9  MCH 29.6  MCHC 33.2  RDW 13.0  PLT 195   Cardiac EnzymesNo results for input(s): TROPONINI in the last 168 hours. No results for input(s): TROPIPOC in the last 168 hours.  BNP Recent Labs  Lab 03/15/21 0446  BNP 114.6*    DDimer No results for input(s): DDIMER in the last 168 hours.  Radiology/Studies:  DG Chest Portable 1 View  Result Date: 03/15/2021 IMPRESSION: No active disease. Electronically Signed   By: Jorje Guild M.D.   On: 03/15/2021 05:23    Assessment and Plan:   1. Dizziness: -Symptoms appear to be more consistent with vertigo in etiology given significant positional component observed in the ED exam room this morning -Consider trial of meclizine -Further work-up per ED  2.  PSVT/PAF/junctional rhythm/sinus bradycardia: -Underlying tachybradycardia syndrome may be contributing to overall presentation of dizziness -He has ambulated in the ED without symptoms and has demonstrated appropriate chronotropic competence -Patient was given the option for hospital admission and EP evaluation inpatient versus outpatient follow-up if he continued to feel well, he chose the latter -Continue with previously scheduled EP appointment on 1/12 -Avoid AV nodal blocking medications  3.  CAD status post prior PCI without angina: -High-sensitivity troponin negative x2 -PTA ASA, clopidogrel, Imdur, statin -No plans for inpatient ischemic evaluation  4. HTN: -BP mildly elevated in the ED -Outpatient follow-up      For questions or updates, please contact [place] Please consult [email or web address] for contact info under Cardiology/STEMI.   Signed, Christell Faith, PA-C [place] Pager: [phone] 03/15/2021, 1:45 PM

## 2021-03-15 NOTE — ED Provider Notes (Signed)
[facility] Provider Note    Event Date/Time   First MD Initiated Contact with Patient 03/15/21 [phone]     (approximate)  History   Chief Complaint: Shortness of Breath  HPI  Nathaniel Thompson is a 86 y.o. male with a past medical history of CHF, CKD, gastric reflux, hypertension, hyperlipidemia, presents to the emergency department for shortness of breath.  According to the patient shortly after laying down to sleep he became very short of breath.  Try to sit up on the bed but states it was not getting any better.  States he felt like he was getting worse so he called EMS.  Patient states as long as he is sitting upright he seems okay, does not feel overly short of breath however if he lies down he will be very short of breath per patient.  States mild chest tightness but denies any chest "pain."  No known fever.  Does state occasional cough.  No lower extremity edema.  Physical Exam   Triage Vital Signs: ED Triage Vitals  Enc Vitals Group     BP 03/15/21 0447 (!) 197/74     Pulse Rate 03/15/21 0447 63     Resp 03/15/21 0447 (!) 28     Temp 03/15/21 0447 98.7 F (37.1 C)     Temp Source 03/15/21 0447 Oral     SpO2 03/15/21 0447 100 %     Weight 03/15/21 0507 224 lb 13.9 oz (102 kg)     Height 03/15/21 0507 5\' 10"  (1.778 m)     Head Circumference --      Peak Flow --      Pain Score 03/15/21 0447 0     Pain Loc --      Pain Edu? --      Excl. in [place]? --     Most recent vital signs: Vitals:   03/15/21 0447 03/15/21 0500  BP: (!) 197/74   Pulse: 63 (!) 56  Resp: (!) 28 (!) 24  Temp: 98.7 F (37.1 C)   SpO2: 100% 98%    General: Awake, no distress, but sitting upright in bed. CV:  Good peripheral perfusion.  Regular rate and rhythm  Resp:  Mild tachypnea with mild increased respiratory effort.  No wheeze rales or rhonchi on exam. Abd:  No distention.  Soft, nontender.  No rebound or guarding. Other:  No significant lower extreme edema  identified.    ED Results / Procedures / Treatments   EKG  EKG viewed and interpreted by myself shows a normal sinus rhythm at 71 bpm with a narrow QRS, normal axis, normal intervals, nonspecific ST changes without ST elevation.  RADIOLOGY  I personally reviewed the chest x-ray images.  No acute abnormality on my examination. Radiology has read the x-ray is negative.   MEDICATIONS ORDERED IN ED: Medications - No data to display   IMPRESSION / MDM / [place] / ED COURSE  I reviewed the triage vital signs and the nursing notes.  Patient presents emergency department for cute onset of shortness of breath overnight tonight.  Currently the patient appears well he is sitting upright in bed with mild tachypnea but overall clear lung sounds satting 98% on room air.  Blood pressure is elevated 197/74.  We will check labs including cardiac enzymes, chest x-ray and continue to closely monitor.  We will also obtain a COVID/influenza swab.  Differential is quite broad at this time would include CHF exacerbation/pulmonary edema/interstitial  edema, infectious etiology such as pneumonia or COVID, pneumothorax, ACS.   X-ray appears to be clear.  Lab work largely [place] including a negative troponin.  Creatinine largely at baseline compared to historical values.  Patient is feeling much better in the emergency department.  Repeat troponin pending.  Anticipate possible discharge home as the patient appears well with a reassuring work-up thus far.  Patient care signed out to oncoming provider.  FINAL CLINICAL IMPRESSION(S) / ED DIAGNOSES   Dyspnea  Rx / DC Orders   Cardiology follow-up  Note:  This document was prepared using Dragon voice recognition software and may include unintentional dictation errors.   Harvest Dark, MD 03/17/21 1513

## 2021-03-15 NOTE — ED Provider Notes (Signed)
Patient received in signout from Dr. Lorel Monaco pending follow-up troponin and observation.  Patient's heart rates in the cardiac monitor have been in the 50s and 60s.  He is normotensive.  Given his history I consulted with cardiology, Dr. Sandrea Matte who evaluated patient at bedside.  Plan was for [place] to give dose of 20 mg IV Lasix and observe.  Patient has diuresed he is ambulating with steady gait denies any dizziness, denies any chest pain or pressure, he has appropriate heart rate response to ambulation.  Feels well.  He has close outpatient follow-up with cardiology.  At this point I do believe he is appropriate for outpatient follow-up.   Merlyn Lot, MD 03/15/21 1022

## 2021-03-16 NOTE — Progress Notes (Signed)
ELECTROPHYSIOLOGY CONSULT NOTE  Patient ID: Nathaniel Thompson, MRN: [id number], DOB/AGE: [birth year] 86 y.o. Admit date: (Not on file) Date of Consult: 03/17/2021  Primary Physician: Einar Pheasant, MD Primary Cardiologist: MA     Nathaniel Thompson is a 86 y.o. male who is being seen today for the evaluation of lightheadedness at the request of MA.    HPI Nathaniel Thompson is a 86 y.o. male referred for episodes of dizziness and shortness of breath.  He has a history of syncope 2019 there was no prodromal symptoms that he can recall.  His episodes of dizziness are frequently almost always associated with dyspnea; duration is typically tens of seconds.  These are unassociated with palpitations.  They have been noted by others (Dr. CE for 1) to occur without discernible change in heart rate.  They are unpredictable.  Event recorder as noted below identified some symptoms with PVCs other symptoms with junctional rhythm.  He has undergone an evaluation for the possibility of vertigo; neurological evaluation 9/22 "suspected focal seizures consistent with previous stroke," albeit with a negative EEG 7/19 and 5/20  Has dyspnea on exertion some peripheral edema.  These symptoms were similar to prior PCI and so underwent catheterization as below   He has a history of coronary artery disease with prior PCI 2003 and 2004 when stents were patent 2022 History of TIA.  DATE TEST EF   9/19 LHC  No obstructive disease  3/21 Echo   55-60 %   12/22 LHC  LCXst and RCAst patent No obstructive CAD   Date Cr K Hgb  1/23 1.41 4.0 12.5         In the office 03/17/2021  on 6/8 out of a number of occasions he became dizzy with lying and or sitting and on 2 occasions accompanied by shortness of breath.  There was no nystagmus ECG rhythm did not change although the heart rate change from 80--100 and these were all associated with junctional rhythm.  No episodes of severe dyspnea occurred during secondary  monitoring    Event Recorder personnally reviewed    HR [id number] AVg 58 VTNS   Symptoms of LH   Junctional rhythm and PVC;  sinus and PVCs Onset of afib (duration just under 6 minutes)  Sinus    Past Medical History:  Diagnosis Date   (HFpEF) heart failure with preserved ejection fraction ([place])    a. 05/2018 Echo: Nl EF, Gr1 DD, sev dil LA, mild MR/TR, mild PAH; b. 05/2019 Echo: EF 55-60%, no rwma, mild LVH. Nl PASP. Mildly dil LA. Triv MR. Mild AoV sclerosis w/o stenosis. Mildly dil Asc AO (11m).   Allergic rhinitis    Barrett's esophagus 10/21/2013   Bone cancer (HCC)    CKD (chronic kidney disease), stage III (HCC)    Colon polyp, hyperplastic    COPD (chronic obstructive pulmonary disease) (HCC)    mild COPD. former smoker   Coronary artery disease    a. 2003 s/p PCI ([facility]); b. 2004 s/p PCI x 2 ([facility]); c. 11/2017 Cath: LM nl, LAD mild dzs, LCX patent stent w/ 30% distal edge restenosis, RCA patent distal stent w/ 30% prox edge stenosis. Nl EF->Med Rx;   Diverticulosis    Fundic gland polyps of stomach, benign    Gastritis    GERD (gastroesophageal reflux disease)    Hypercholesteremia    Hypertension    [place]    Prostatism    Reflux esophagitis  Renal stones    Skin cancer    left cheek/lesion excised   Sleep apnea    Tachycardia    a. 11/2017 admit w/ tachycardia/? afib-->no afib noted upon review (amio/OAC d/c'd).   TIA (transient ischemic attack)       Surgical History:  Past Surgical History:  Procedure Laterality Date   CARDIAC CATHETERIZATION     COLONOSCOPY WITH PROPOFOL N/A 09/13/2015   Procedure: COLONOSCOPY WITH PROPOFOL;  Surgeon: Lollie Sails, MD;  Location: [facility] ENDOSCOPY;  Service: Endoscopy;  Laterality: N/A;   COLONOSCOPY WITH PROPOFOL N/A 05/07/2019   Procedure: COLONOSCOPY WITH PROPOFOL;  Surgeon: Robert Bellow, MD;  Location: [facility] ENDOSCOPY;  Service: Endoscopy;  Laterality: N/A;   CORONARY ANGIOPLASTY      ESOPHAGOGASTRODUODENOSCOPY (EGD) WITH PROPOFOL N/A 09/13/2015   Procedure: ESOPHAGOGASTRODUODENOSCOPY (EGD) WITH PROPOFOL;  Surgeon: Lollie Sails, MD;  Location: [facility] ENDOSCOPY;  Service: Endoscopy;  Laterality: N/A;   ESOPHAGOGASTRODUODENOSCOPY (EGD) WITH PROPOFOL N/A 12/28/2015   Procedure: ESOPHAGOGASTRODUODENOSCOPY (EGD) WITH PROPOFOL;  Surgeon: Lollie Sails, MD;  Location: [facility] ENDOSCOPY;  Service: Endoscopy;  Laterality: N/A;   ESOPHAGOGASTRODUODENOSCOPY (EGD) WITH PROPOFOL N/A 06/16/2016   Procedure: ESOPHAGOGASTRODUODENOSCOPY (EGD) WITH PROPOFOL;  Surgeon: Lollie Sails, MD;  Location[facility] ENDOSCOPY;  Service: Endoscopy;  Laterality: N/A;   ESOPHAGOGASTRODUODENOSCOPY (EGD) WITH PROPOFOL N/A 05/07/2019   Procedure: ESOPHAGOGASTRODUODENOSCOPY (EGD) WITH PROPOFOL;  Surgeon: Robert Bellow, MD;  Location: [facility] ENDOSCOPY;  Service: Endoscopy;  Laterality: N/A;   EYE SURGERY  2013   CATARACT EXTRACTION   GANGLION CYST EXCISION Right 05/18/2017   Procedure: REMOVAL GANGLION CYST ANKLE;  Surgeon: Samara Deist, DPM;  Location: [facility] ORS;  Service: Podiatry;  Laterality: Right;   heart cath stent     LEFT HEART CATH AND CORONARY ANGIOGRAPHY Right 12/03/2017   Procedure: Left Heart Cath and Coronary Angiography with possible coronary intervention;  Surgeon: Dionisio David, MD;  Location: [facility] CV LAB;  Service: Cardiovascular;  Laterality: Right;   PROSTATE BIOPSY     RIGHT/LEFT HEART CATH AND CORONARY ANGIOGRAPHY N/A 02/07/2021   Procedure: RIGHT/LEFT HEART CATH AND CORONARY ANGIOGRAPHY;  Surgeon: [name], MD;  Location: [facility] CV LAB;  Service: Cardiovascular;  Laterality: N/A;   stents     multiple     Home Meds: Current Meds  Medication Sig   amLODipine (NORVASC) 10 MG tablet Take 5 mg by mouth daily.   aspirin EC 81 MG tablet Take 81 mg by mouth daily.   azelastine (ASTELIN) 0.1 % nasal spray Place 1 spray into both nostrils as needed for rhinitis.  Use in each nostril as directed   Calcium Carb-Cholecalciferol (CALCIUM + D3 PO) Take 1 tablet by mouth daily.   cholecalciferol (VITAMIN D) 1000 units tablet Take 1,000 Units by mouth daily.    clopidogrel (PLAVIX) 75 MG tablet TAKE 1 TABLET BY MOUTH ONCE DAILY   ferrous sulfate 324 MG TBEC Take 324 mg by mouth daily.   fexofenadine (ALLEGRA) 180 MG tablet Take 180 mg by mouth daily as needed for allergies or rhinitis.    fluticasone (FLONASE) 50 MCG/ACT nasal spray Place 2 sprays into both nostrils daily as needed for allergies.   furosemide (LASIX) 20 MG tablet Take 1 tablet (20 mg total) by mouth daily.   isosorbide mononitrate (IMDUR) 30 MG 24 hr tablet TAKE 1 TABLET BY MOUTH ONCE DAILY   losartan (COZAAR) 50 MG tablet Take 1 tablet (50 mg total) by mouth daily.   lovastatin (  MEVACOR) 40 MG tablet TAKE 2 TABLETS BY MOUTH AT BEDTIME   Multiple Vitamin (MULTI-VITAMINS) TABS Take 1 tablet by mouth daily.    pantoprazole (PROTONIX) 40 MG tablet Take 40 mg by mouth 2 (two) times daily.     Allergies:  Allergies  Allergen Reactions   Atorvastatin Other (See Comments)    Achy joints    Social History   Socioeconomic History   Marital status: Married    Spouse name: Not on file   Number of children: Not on file   Years of education: Not on file   Highest education level: Not on file  Occupational History   Not on file  Tobacco Use   Smoking status: Former    Packs/day: 1.00    Years: 35.00    Pack years: 35.00    Types: Cigarettes    Quit date: 03/06/1986    Years since quitting: 35.0   Smokeless tobacco: Former    Types: Nurse, children's Use: Never used  Substance and Sexual Activity   Alcohol use: No   Drug use: No   Sexual activity: Not Currently  Other Topics Concern   Not on file  Social History Narrative   Not on file   Social Determinants of Health   Financial Resource Strain: Low Risk    Difficulty of Paying Living Expenses: Not hard at all  Food  Insecurity: No Food Insecurity   Worried About Charity fundraiser in the Last Year: Never true   [place] in the Last Year: Never true  Transportation Needs: No Transportation Needs   Lack of Transportation (Medical): No   Lack of Transportation (Non-Medical): No  Physical Activity: Unknown   Days of Exercise per Week: 0 days   Minutes of Exercise per Session: Not on file  Stress: No Stress Concern Present   Feeling of Stress : Not at all  Social Connections: Unknown   Frequency of Communication with Friends and Family: Not on file   Frequency of Social Gatherings with Friends and Family: Not on file   Attends Religious Services: Not on Electrical engineer or Organizations: Not on file   Attends Archivist Meetings: Not on file   Marital Status: Married  Human resources officer Violence: Not At Risk   Fear of Current or Ex-Partner: No   Emotionally Abused: No   Physically Abused: No   Sexually Abused: No     Family History  Problem Relation Age of Onset   Cancer Mother        gastric and lung   Cancer Father        multiple myeloma   Stroke Father    Cancer Sister        leukemia   Cancer Brother        leukemia   Cancer Brother        kidney   Cancer Daughter        Uterine   Cancer Other        [place] (Sister's Son): Prostate     ROS:  Please see the history of present illness.     All other systems reviewed and negative.    Physical Exam: Blood pressure 121/66, pulse 63, height '5\' 10"'  (1.778 m), weight 222 lb (100.7 kg), SpO2 98 %. General: Well developed, well nourished male in no acute distress. Head: Normocephalic, atraumatic, sclera non-icteric, no xanthomas, nares are without discharge. EENT: normal  Lymph Nodes:  none Neck: Negative for carotid bruits. JVD 8 cm Back:without scoliosis kyphosis  Lungs: Clear bilaterally to auscultation without wheezes, rales, or rhonchi. Breathing is unlabored. Heart: RRR with S1 S2. No murmur . No  rubs, or gallops appreciated. Abdomen: Soft, non-tender, non-distended with normoactive bowel sounds. No hepatomegaly. No rebound/guarding. No obvious abdominal masses. Msk:  Strength and tone appear normal for age. Extremities: No clubbing or cyanosis.  1+ right greater than left edema.  Distal pedal pulses are 2+ and equal bilaterally. Skin: Warm and Dry Neuro: Alert and oriented X 3. CN III-XII intact Grossly normal sensory and motor function . Psych:  Responds to questions appropriately with a normal affect.        EKG: Sinus rhythm at 57 Intervals 13/10/42 Abnormal P wave axis with negative P waves 2 3 and F and a biphasic positive negative P wave in lead V1 suggesting a low right atrial focus  As noted prolonged monitoring was associated with junctional rhythm and heart rate ranges from the 70--105 or so   Assessment and Plan:  Dizziness often but not always associated with dyspnea-sometimes positional sometimes not  Ischemic heart disease with prior PCI and in no obstructive lesions 12/22; normal LV function  Junctional rhythm  PVCs  Ventricular tachycardia-nonsustained  Ectopic low atrial rhythm  Hypertension     The patient has some easy things and some not so easy things.  His palpitations which are bothersome to him are PVCs.  I suggest that he not worry about that.  He is volume overloaded.  We will increase his furosemide from 20--40 x 5 days.  Hopefully this will help some of his dyspnea and his nocturnal dyspnea.  He has junctional rhythm which was identified on his event recorder with symptoms and is largely dominant in the office today.  It is enhanced automaticity over running his sinus node as opposed to coming out as an escape rhythm.  It can be difficult to suppress; occasionally we have used amiodarone.  Some people are submitted for ablation of the junctional site and/or AV junction ablation.  Given his bradycardia, the use of amiodarone would be  precluding without backup bradycardia pacing.  Hence, we have discussed pacing and we will pursue left bundle branch block.  Pacing.  I discussed the aforementioned benefits as well as risks including but not limited to death perforation of lung and/or heart, lead dislodgment or perforation requiring procedure, pocket hematoma and/or infection.  He and his wife understand these risks and would like to proceed.  Previously he had undergone vestibular rehab with some improvement.  I Georgina Peer suggest that he do that again.  Blood pressure is reasonably controlled on his current regime of amlodipine 5 mg a day, losartan 50 and we will continue this.  Will continue on aspirin and Plavix        Virl Axe

## 2021-03-17 ENCOUNTER — Encounter: Payer: Self-pay | Admitting: Internal Medicine

## 2021-03-17 ENCOUNTER — Encounter: Payer: Self-pay | Admitting: *Deleted

## 2021-03-17 ENCOUNTER — Other Ambulatory Visit: Payer: Self-pay

## 2021-03-17 ENCOUNTER — Ambulatory Visit: Payer: Medicare HMO | Admitting: Internal Medicine

## 2021-03-17 VITALS — BP 121/66 | HR 63 | Ht 70.0 in | Wt 222.0 lb

## 2021-03-17 DIAGNOSIS — R001 Bradycardia, unspecified: Secondary | ICD-10-CM

## 2021-03-17 DIAGNOSIS — R42 Dizziness and giddiness: Secondary | ICD-10-CM | POA: Diagnosis not present

## 2021-03-17 DIAGNOSIS — I471 Supraventricular tachycardia: Secondary | ICD-10-CM

## 2021-03-17 DIAGNOSIS — I4891 Unspecified atrial fibrillation: Secondary | ICD-10-CM | POA: Diagnosis not present

## 2021-03-17 MED ORDER — AMLODIPINE BESYLATE 5 MG PO TABS: 5.0000 mg | ORAL_TABLET | Freq: Every day | ORAL | 1 refills | Status: DC

## 2021-03-17 NOTE — Patient Instructions (Signed)
Medication Instructions:  - Your physician has recommended you make the following change in your medication:   1) INCREASE lasix (furosemide) 20 mg: - take 2 tablets (40 mg) by mouth once daily x 5 days, then - resume 1 tablet (20 mg) by mouth once daily   *If you need a refill on your cardiac medications before your next appointment, please call your pharmacy*   Lab Work: - none ordered  If you have labs (blood work) drawn today and your tests are completely normal, you will receive your results only by: [place] (if you have MyChart) OR A paper copy in the mail If you have any lab test that is abnormal or we need to change your treatment, we will call you to review the results.   Testing/Procedures:  1) Pacemaker Implant: - Your physician has recommended that you have a pacemaker inserted. A pacemaker is a small device that is placed under the skin of your chest or abdomen to help control abnormal heart rhythms. This device uses electrical pulses to prompt the heart to beat at a normal rate. Pacemakers are used to treat heart rhythms that are too slow. Wire (leads) are attached to the pacemaker that goes into the chambers of you heart. This is done in the hospital and usually requires and overnight stay. Please see the instruction sheet given to you today for more information.    Follow-Up: At [facility], you and your health needs are our priority.  As part of our continuing mission to provide you with exceptional heart care, we have created designated Provider Care Teams.  These Care Teams include your primary Cardiologist (physician) and Advanced Practice Providers (APPs -  Physician Assistants and Nurse Practitioners) who all work together to provide you with the care you need, when you need it.  We recommend signing up for the patient portal called "MyChart".  Sign up information is provided on this After Visit Summary.  MyChart is used to connect with patients for  Virtual Visits (Telemedicine).  Patients are able to view lab/test results, encounter notes, upcoming appointments, etc.  Non-urgent messages can be sent to your provider as well.   To learn more about what you can do with MyChart, go to [email or web address].    Your next appointment:   1) 10-14 days (from 04/04/21) with the [facility] for a wound check- [place] office  2) 91 days (from 04/04/21) [facility]  The format for your next appointment:   In Person  Provider:   As above     Other Instructions  Pacemaker Implantation, Adult Pacemaker implantation is a procedure to place a pacemaker inside the chest. A pacemaker is a small computer that sends electrical signals to the heart and helps the heart beat normally. A pacemaker also stores information about heart rhythms. You may need pacemaker implantation if you have: A slow heartbeat (bradycardia). Loss of consciousness that happens repeatedly (syncope) or repeated episodes of dizziness or light-headedness because of an irregular heart rate. Shortness of breath (dyspnea) due to heart problems. The pacemaker usually attaches to your heart through a wire called a lead. One or two leads may be needed. There are different types of pacemakers: Transvenous pacemaker. This type is placed under the skin or muscle of your upper chest area. The lead goes through a vein in the chest area to reach the inside of the heart. Epicardial pacemaker. This type is placed under the skin or muscle of your chest or  abdomen. The lead goes through your chest to the outside of the heart. Tell a health care provider about: Any allergies you have. All medicines you are taking, including vitamins, herbs, eye drops, creams, and over-the-counter medicines. Any problems you or family members have had with anesthetic medicines. Any blood or bone disorders you have. Any surgeries you have had. Any medical conditions you have. Whether  you are pregnant or may be pregnant. What are the risks? Generally, this is a safe procedure. However, problems may occur, including: Infection. Bleeding. Failure of the pacemaker or the lead. Collapse of a lung or bleeding into a lung. Blood clot inside a blood vessel with a lead. Damage to the heart. Infection inside the heart (endocarditis). Allergic reactions to medicines. What happens before the procedure? Staying hydrated Follow instructions from your health care provider about hydration, which may include: Up to 2 hours before the procedure - you may continue to drink clear liquids, such as water, clear fruit juice, black coffee, and plain tea.  Eating and drinking restrictions Follow instructions from your health care provider about eating and drinking, which may include: 8 hours before the procedure - stop eating heavy meals or foods, such as meat, fried foods, or fatty foods. 6 hours before the procedure - stop eating light meals or foods, such as toast or cereal. 6 hours before the procedure - stop drinking milk or drinks that contain milk. 2 hours before the procedure - stop drinking clear liquids. Medicines Ask your health care provider about: Changing or stopping your regular medicines. This is especially important if you are taking diabetes medicines or blood thinners. Taking medicines such as aspirin and ibuprofen. These medicines can thin your blood. Do not take these medicines unless your health care provider tells you to take them. Taking over-the-counter medicines, vitamins, herbs, and supplements. Tests You may have: A heart evaluation. This may include: An electrocardiogram (ECG). This involves placing patches on your skin to check your heart rhythm. A chest X-ray. An echocardiogram. This is a test that uses sound waves (ultrasound) to produce an image of the heart. A cardiac rhythm monitor. This is used to record your heart rhythm and any events for a longer  period of time. Blood tests. Genetic testing. General instructions Do not use any products that contain nicotine or tobacco for at least 4 weeks before the procedure. These products include cigarettes, e-cigarettes, and chewing tobacco. If you need help quitting, ask your health care provider. Ask your health care provider: How your surgery site will be marked. What steps will be taken to help prevent infection. These steps may include: Removing hair at the surgery site. Washing skin with a germ-killing soap. Receiving antibiotic medicine. Plan to have someone take you home from the hospital or clinic. If you will be going home right after the procedure, plan to have someone with you for 24 hours. What happens during the procedure? An IV will be inserted into one of your veins. You will be given one or more of the following: A medicine to help you relax (sedative). A medicine to numb the area (local anesthetic). A medicine to make you fall asleep (general anesthetic). The next steps vary depending on the type of pacemaker you will be getting. If you are getting a transvenous pacemaker: An incision will be made in your upper chest. A pocket will be made for the pacemaker. It may be placed under the skin or between layers of muscle. The lead will be inserted  into a blood vessel that goes to the heart. While X-rays are taken by an imaging machine (fluoroscopy), the lead will be advanced through the vein to the inside of your heart. The other end of the lead will be tunneled under the skin and attached to the pacemaker. If you are getting an epicardial pacemaker: An incision will be made near your ribs or breastbone (sternum) for the lead. The lead will be attached to the outside of your heart. Another incision will be made in your chest or upper abdomen to create a pocket for the pacemaker. The free end of the lead will be tunneled under the skin and attached to the pacemaker. The  transvenous or epicardial pacemaker will be tested. Imaging studies may be done to check the lead position. The incisions will be closed with stitches (sutures), adhesive strips, or skin glue. Bandages (dressings) will be placed over the incisions. The procedure may vary among health care providers and hospitals. What happens after the procedure? Your blood pressure, heart rate, breathing rate, and blood oxygen level will be monitored until you leave the hospital or clinic. You may be given antibiotics. You will be given pain medicine. An ECG and chest X-rays will be done. You may need to wear a continuous type of ECG (Holter monitor) to check your heart rhythm. Your health care provider will program the pacemaker. If you were given a sedative during the procedure, it can affect you for several hours. Do not drive or operate machinery until your health care provider says that it is safe. You will be given a pacemaker identification card. This card lists the implant date, device model, and manufacturer of your pacemaker. Summary A pacemaker is a small computer that sends electrical signals to the heart and helps the heart beat normally. There are different types of pacemakers. A pacemaker may be placed under the skin or muscle of your chest or abdomen. Follow instructions from your health care provider about eating and drinking and about taking medicines before the procedure. This information is not intended to replace advice given to you by your health care provider. Make sure you discuss any questions you have with your health care provider. Document Revised: 11/02/2020 Document Reviewed: 01/22/2019 Elsevier Patient Education  2022 [employer].

## 2021-03-22 ENCOUNTER — Inpatient Hospital Stay: Payer: Medicare HMO

## 2021-03-22 ENCOUNTER — Other Ambulatory Visit: Payer: Self-pay

## 2021-03-22 ENCOUNTER — Inpatient Hospital Stay: Payer: Medicare HMO | Attending: Internal Medicine | Admitting: Internal Medicine

## 2021-03-22 ENCOUNTER — Encounter: Payer: Self-pay | Admitting: Internal Medicine

## 2021-03-22 DIAGNOSIS — C61 Malignant neoplasm of prostate: Secondary | ICD-10-CM | POA: Insufficient documentation

## 2021-03-22 DIAGNOSIS — Z5111 Encounter for antineoplastic chemotherapy: Secondary | ICD-10-CM | POA: Diagnosis not present

## 2021-03-22 DIAGNOSIS — C7951 Secondary malignant neoplasm of bone: Secondary | ICD-10-CM | POA: Diagnosis not present

## 2021-03-22 LAB — CBC WITH DIFFERENTIAL/PLATELET
Abs Immature Granulocytes: 0.01 10*3/uL (ref 0.00–0.07)
Basophils Absolute: 0 10*3/uL (ref 0.0–0.1)
Basophils Relative: 1 %
Eosinophils Absolute: 0.2 10*3/uL (ref 0.0–0.5)
Eosinophils Relative: 5 %
HCT: 39.5 % (ref 39.0–52.0)
Hemoglobin: 13.5 g/dL (ref 13.0–17.0)
Immature Granulocytes: 0 %
Lymphocytes Relative: 36 %
Lymphs Abs: 1.8 10*3/uL (ref 0.7–4.0)
MCH: 30.1 pg (ref 26.0–34.0)
MCHC: 34.2 g/dL (ref 30.0–36.0)
MCV: 88 fL (ref 80.0–100.0)
Monocytes Absolute: 0.5 10*3/uL (ref 0.1–1.0)
Monocytes Relative: 9 %
Neutro Abs: 2.6 10*3/uL (ref 1.7–7.7)
Neutrophils Relative %: 49 %
Platelets: 196 10*3/uL (ref 150–400)
RBC: 4.49 MIL/uL (ref 4.22–5.81)
RDW: 12.8 % (ref 11.5–15.5)
WBC: 5.1 10*3/uL (ref 4.0–10.5)
nRBC: 0 % (ref 0.0–0.2)

## 2021-03-22 LAB — COMPREHENSIVE METABOLIC PANEL
ALT: 16 U/L (ref 0–44)
AST: 25 U/L (ref 15–41)
Albumin: 4.3 g/dL (ref 3.5–5.0)
Alkaline Phosphatase: 55 U/L (ref 38–126)
Anion gap: 8 (ref 5–15)
BUN: 25 mg/dL — ABNORMAL HIGH (ref 8–23)
CO2: 25 mmol/L (ref 22–32)
Calcium: 9.3 mg/dL (ref 8.9–10.3)
Chloride: 105 mmol/L (ref 98–111)
Creatinine, Ser: 1.36 mg/dL — ABNORMAL HIGH (ref 0.61–1.24)
GFR, Estimated: 52 mL/min — ABNORMAL LOW (ref >60)
Glucose, Bld: 102 mg/dL — ABNORMAL HIGH (ref 70–99)
Potassium: 3.8 mmol/L (ref 3.5–5.1)
Sodium: 138 mmol/L (ref 135–145)
Total Bilirubin: 0.7 mg/dL (ref 0.3–1.2)
Total Protein: 7.2 g/dL (ref 6.5–8.1)

## 2021-03-22 LAB — PSA: Prostatic Specific Antigen: <0.01 ng/mL (ref 0.00–4.00)

## 2021-03-22 MED ORDER — LEUPROLIDE ACETATE (4 MONTH) 30 MG ~~LOC~~ KIT
30.0000 mg | PACK | Freq: Once | SUBCUTANEOUS | Status: AC
  Administered 2021-03-22: 30 mg via SUBCUTANEOUS
  Filled 2021-03-22: qty 30

## 2021-03-22 NOTE — Assessment & Plan Note (Addendum)
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 Stage IV-on Eligard; Bone scan October 2020-improved;  SEP 2022- PSA- <0.1. Proceed with eligard q4M today.   # Iron deficient anemia/CKD- III-[s/p EGD March 2021]--status post IV iron infusion x1 [FEB 2022]-today hemoglobin is 13.2  STABLE.  Hold off on infusion.  Continue oral iron.  #CKD stage III-GFR 52.  Clinically stable.  # A. fib paroxysmal-sinus rhythm; dizzy spells-; awaiting pp in end of Jan- aspirin Plavix- STABLE.   # DISPOSITION:  # Eligard today; HOLD  Venofer # Follow up in 4 months; MD;labs- cbc.cmp/PSA eligard; possible venofer- Dr.B  Cc; Dr.Scott/

## 2021-03-22 NOTE — Progress Notes (Signed)
Patient is having a pace maker placed on 04/04/21.

## 2021-03-22 NOTE — Progress Notes (Signed)
[facility] OFFICE PROGRESS NOTE  Patient Care Team: Einar Pheasant, MD as PCP - General (Internal Medicine) [name], MD as PCP - Cardiology (Cardiology) [name], MD as Consulting Physician (Cardiology) Cammie Sickle, MD as Consulting Physician (Hematology and Oncology)   Cancer Staging  No matching staging information was found for the patient.   Oncology History Overview Note  Nov 2017- completed IM RT radiation therapy to his prostate and pelvic nodes for Gleason 7 (4+3) adenocarcinoma the prostate presenting the PSA of 7.8.  # JAN 2019- METASTATIC PROSTATE CA to Bone [low volume met on bone scan; CT- NED]; PSA ~50; March 25th 2019- Lupron 45 mg IM [Urology q 19M]; June 26, 2018-Eligard every 3 months[intolerance to Lupron/question A. Fib/injection site pain  # May 23rd Zytiga 223m/day; no prednisone; STOPPED MARCH 17th 2021-repeated severe hypokalemia; Eligard  # Barretts- EGD/ colo [[place]2021; Dr.Byrnett].   # OBenita Stabile [place]]; Oct 2019-paroxysmal A.fib/not on eliquis; [Dr.Khan] -Dr.Arida ; Dizzy spells [Dr.Shah]  ------------------------------------------------------------------  DIAGNOSIS: [ JAN 2019]- Met- PROSTATE CANCER  STAGE:  IV       ;GOALS: PALLIATIVE     [place]state cancer ([place]     INTERVAL HISTORY: Patient is with his wife.  He is walking independently.  JMaylene [id number].  male pleasant patient above history of metastatic prostate cancer on ADT is here for follow-up.  Patient continues to have intermittent dizzy spells.  He is currently awaiting pacemaker placement in the month.  Otherwise no worsening hot flashes or joint pains. Patient has gained weight.  No difficulty with urination.  Review of Systems  Constitutional:  Negative for chills, diaphoresis, fever, malaise/fatigue and weight loss.  HENT:  Negative for nosebleeds and sore throat.   Eyes:  Negative for double vision.  Respiratory:   Negative for cough, hemoptysis, sputum production, shortness of breath and wheezing.   Cardiovascular:  Negative for chest pain, palpitations, orthopnea and leg swelling.  Gastrointestinal:  Negative for abdominal pain, blood in stool, constipation, diarrhea, heartburn, melena, nausea and vomiting.  Genitourinary:  Negative for dysuria, frequency and urgency.  Musculoskeletal:  Positive for myalgias. Negative for back pain and joint pain.  Skin: Negative.  Negative for itching and rash.  Neurological:  Positive for dizziness. Negative for tingling, focal weakness, weakness and headaches.  Endo/Heme/Allergies:  Does not bruise/bleed easily.  Psychiatric/Behavioral:  Negative for depression. The patient is not nervous/anxious and does not have insomnia.    PAST MEDICAL HISTORY :  Past Medical History:  Diagnosis Date   (HFpEF) heart failure with preserved ejection fraction ([place]    a. 05/2018 Echo: Nl EF, Gr1 DD, sev dil LA, mild MR/TR, mild PAH; b. 05/2019 Echo: EF 55-60%, no rwma, mild LVH. Nl PASP. Mildly dil LA. Triv MR. Mild AoV sclerosis w/o stenosis. Mildly dil Asc AO (361m.   Allergic rhinitis    Barrett's esophagus 10/21/2013   Bone cancer (HCC)    CKD (chronic kidney disease), stage III (HCC)    Colon polyp, hyperplastic    COPD (chronic obstructive pulmonary disease) (HCC)    mild COPD. former smoker   Coronary artery disease    a. 2003 s/p PCI ([facility]); b. 2004 s/p PCI x 2 ([facility]); c. 11/2017 Cath: LM nl, LAD mild dzs, LCX patent stent w/ 30% distal edge restenosis, RCA patent distal stent w/ 30% prox edge stenosis. Nl EF->Med Rx;   Diverticulosis    Fundic gland polyps of stomach, benign  Gastritis    GERD (gastroesophageal reflux disease)    Hypercholesteremia    Hypertension    Prostate cancer ([place])    Prostatism    Reflux esophagitis    Renal stones    Skin cancer    left cheek/lesion excised   Sleep apnea    Tachycardia    a. 11/2017 admit w/ tachycardia/? afib-->no  afib noted upon review (amio/OAC d/c'd).   TIA (transient ischemic attack)     PAST SURGICAL HISTORY :   Past Surgical History:  Procedure Laterality Date   CARDIAC CATHETERIZATION     COLONOSCOPY WITH PROPOFOL N/A 09/13/2015   Procedure: COLONOSCOPY WITH PROPOFOL;  Surgeon: Lollie Sails, MD;  Location: [facility] ENDOSCOPY;  Service: Endoscopy;  Laterality: N/A;   COLONOSCOPY WITH PROPOFOL N/A 05/07/2019   Procedure: COLONOSCOPY WITH PROPOFOL;  Surgeon: Robert Bellow, MD;  Location: [facility] ENDOSCOPY;  Service: Endoscopy;  Laterality: N/A;   CORONARY ANGIOPLASTY     ESOPHAGOGASTRODUODENOSCOPY (EGD) WITH PROPOFOL N/A 09/13/2015   Procedure: ESOPHAGOGASTRODUODENOSCOPY (EGD) WITH PROPOFOL;  Surgeon: Lollie Sails, MD;  Location: [facility] ENDOSCOPY;  Service: Endoscopy;  Laterality: N/A;   ESOPHAGOGASTRODUODENOSCOPY (EGD) WITH PROPOFOL N/A 12/28/2015   Procedure: ESOPHAGOGASTRODUODENOSCOPY (EGD) WITH PROPOFOL;  Surgeon: Lollie Sails, MD;  Location: [facility] ENDOSCOPY;  Service: Endoscopy;  Laterality: N/A;   ESOPHAGOGASTRODUODENOSCOPY (EGD) WITH PROPOFOL N/A 06/16/2016   Procedure: ESOPHAGOGASTRODUODENOSCOPY (EGD) WITH PROPOFOL;  Surgeon: Lollie Sails, MD;  Location: [facility] ENDOSCOPY;  Service: Endoscopy;  Laterality: N/A;   ESOPHAGOGASTRODUODENOSCOPY (EGD) WITH PROPOFOL N/A 05/07/2019   Procedure: ESOPHAGOGASTRODUODENOSCOPY (EGD) WITH PROPOFOL;  Surgeon: Robert Bellow, MD;  Location: [facility] ENDOSCOPY;  Service: Endoscopy;  Laterality: N/A;   EYE SURGERY  2013   CATARACT EXTRACTION   GANGLION CYST EXCISION Right 05/18/2017   Procedure: REMOVAL GANGLION CYST ANKLE;  Surgeon: Samara Deist, DPM;  Location: [facility] ORS;  Service: Podiatry;  Laterality: Right;   heart cath stent     LEFT HEART CATH AND CORONARY ANGIOGRAPHY Right 12/03/2017   Procedure: Left Heart Cath and Coronary Angiography with possible coronary intervention;  Surgeon: Dionisio David, MD;  Location: [facility] CV LAB;   Service: Cardiovascular;  Laterality: Right;   PROSTATE BIOPSY     RIGHT/LEFT HEART CATH AND CORONARY ANGIOGRAPHY N/A 02/07/2021   Procedure: RIGHT/LEFT HEART CATH AND CORONARY ANGIOGRAPHY;  Surgeon: [name], MD;  Location: [facility] CV LAB;  Service: Cardiovascular;  Laterality: N/A;   stents     multiple    FAMILY HISTORY :   Family History  Problem Relation Age of Onset   Cancer Mother        gastric and lung   Cancer Father        multiple myeloma   Stroke Father    Cancer Sister        leukemia   Cancer Brother        leukemia   Cancer Brother        kidney   Cancer Daughter        Uterine   Cancer Other        [employer] copy Son): Prostate    SOCIAL HISTORY:   Social History   Tobacco Use   Smoking status: Former    Packs/day: 1.00    Years: 35.00    Pack years: 35.00    Types: Cigarettes    Quit date: 03/06/1986    Years since quitting: 35.0   Smokeless tobacco: Former    Types: Loss adjuster, chartered  Vaping Use   Vaping Use: Never used  Substance Use Topics   Alcohol use: No   Drug use: No    ALLERGIES:  is allergic to atorvastatin.  MEDICATIONS:  Current Outpatient Medications  Medication Sig Dispense Refill   amLODipine (NORVASC) 5 MG tablet Take 1 tablet (5 mg total) by mouth daily. 90 tablet 1   aspirin EC 81 MG tablet Take 81 mg by mouth daily.     azelastine (ASTELIN) 0.1 % nasal spray Place 1 spray into both nostrils as needed for rhinitis. Use in each nostril as directed     Calcium Carb-Cholecalciferol (CALCIUM + D3 PO) Take 1 tablet by mouth daily.     cholecalciferol (VITAMIN D) 1000 units tablet Take 1,000 Units by mouth daily.      clopidogrel (PLAVIX) 75 MG tablet TAKE 1 TABLET BY MOUTH ONCE DAILY 90 tablet 0   ferrous sulfate 324 MG TBEC Take 324 mg by mouth daily.     fexofenadine (ALLEGRA) 180 MG tablet Take 180 mg by mouth daily as needed for allergies or rhinitis.      furosemide (LASIX) 20 MG tablet Take 1 tablet (20 mg total) by  mouth daily. (Patient taking differently: Take 20 mg by mouth daily. Took 41m for 5 days and will resume 219mQD tomorrow.) 30 tablet 3   isosorbide mononitrate (IMDUR) 30 MG 24 hr tablet TAKE 1 TABLET BY MOUTH ONCE DAILY 90 tablet 2   losartan (COZAAR) 50 MG tablet Take 1 tablet (50 mg total) by mouth daily. 90 tablet 1   lovastatin (MEVACOR) 40 MG tablet TAKE 2 TABLETS BY MOUTH AT BEDTIME 180 tablet 1   Multiple Vitamin (MULTI-VITAMINS) TABS Take 1 tablet by mouth daily.      pantoprazole (PROTONIX) 40 MG tablet Take 40 mg by mouth 2 (two) times daily.      fluticasone (FLONASE) 50 MCG/ACT nasal spray Place 2 sprays into both nostrils daily as needed for allergies. (Patient not taking: Reported on 03/22/2021)     No current facility-administered medications for this visit.    PHYSICAL EXAMINATION: ECOG PERFORMANCE STATUS: 0 - Asymptomatic  BP 128/78    Pulse 63    Temp 98 F (36.7 C)    Resp 18    Wt 221 lb 12.8 oz (100.6 kg)    BMI 31.82 kg/m   Filed Weights   03/22/21 1305  Weight: 221 lb 12.8 oz (100.6 kg)    Physical Exam HENT:     Head: Normocephalic and atraumatic.     Mouth/Throat:     Pharynx: No oropharyngeal exudate.  Eyes:     Pupils: Pupils are equal, round, and reactive to light.  Cardiovascular:     Rate and Rhythm: Normal rate and regular rhythm.  Pulmonary:     Effort: No respiratory distress.     Breath sounds: No wheezing.  Abdominal:     General: Bowel sounds are normal. There is no distension.     Palpations: Abdomen is soft. There is no mass.     Tenderness: There is no abdominal tenderness. There is no guarding or rebound.  Musculoskeletal:        General: No tenderness. Normal range of motion.     Cervical back: Normal range of motion and neck supple.  Skin:    General: Skin is warm.  Neurological:     Mental Status: He is alert and oriented to person, place, and time.  Psychiatric:  Mood and Affect: Affect normal.    LABORATORY DATA:   I have reviewed the data as listed    Component Value Date/Time   NA 138 03/22/2021 1220   NA 141 02/04/2021 0810   NA 140 09/16/2013 0438   K 3.8 03/22/2021 1220   K 4.2 09/16/2013 0438   CL 105 03/22/2021 1220   CL 109 (H) 09/16/2013 0438   CO2 25 03/22/2021 1220   CO2 24 09/16/2013 0438   GLUCOSE 102 (H) 03/22/2021 1220   GLUCOSE 101 (H) 09/16/2013 0438   BUN 25 (H) 03/22/2021 1220   BUN 18 02/04/2021 0810   BUN 20 (H) 09/16/2013 0438   CREATININE 1.36 (H) 03/22/2021 1220   CREATININE 1.28 09/16/2013 0438   CALCIUM 9.3 03/22/2021 1220   CALCIUM 8.3 (L) 09/16/2013 0438   PROT 7.2 03/22/2021 1220   PROT 6.7 09/15/2013 0755   ALBUMIN 4.3 03/22/2021 1220   ALBUMIN 3.5 09/15/2013 0755   AST 25 03/22/2021 1220   AST 37 09/15/2013 0755   ALT 16 03/22/2021 1220   ALT 28 09/15/2013 0755   ALKPHOS 55 03/22/2021 1220   ALKPHOS 44 (L) 09/15/2013 0755   BILITOT 0.7 03/22/2021 1220   BILITOT 0.5 09/15/2013 0755   GFRNONAA 52 (L) 03/22/2021 1220   GFRNONAA 54 (L) 09/16/2013 0438   GFRAA 59 (L) 10/07/2019 1305   GFRAA >60 09/16/2013 0438    No results found for: SPEP, UPEP  Lab Results  Component Value Date   WBC 5.1 03/22/2021   NEUTROABS 2.6 03/22/2021   HGB 13.5 03/22/2021   HCT 39.5 03/22/2021   MCV 88.0 03/22/2021   PLT 196 03/22/2021      Chemistry      Component Value Date/Time   NA 138 03/22/2021 1220   NA 141 02/04/2021 0810   NA 140 09/16/2013 0438   K 3.8 03/22/2021 1220   K 4.2 09/16/2013 0438   CL 105 03/22/2021 1220   CL 109 (H) 09/16/2013 0438   CO2 25 03/22/2021 1220   CO2 24 09/16/2013 0438   BUN 25 (H) 03/22/2021 1220   BUN 18 02/04/2021 0810   BUN 20 (H) 09/16/2013 0438   CREATININE 1.36 (H) 03/22/2021 1220   CREATININE 1.28 09/16/2013 0438      Component Value Date/Time   CALCIUM 9.3 03/22/2021 1220   CALCIUM 8.3 (L) 09/16/2013 0438   ALKPHOS 55 03/22/2021 1220   ALKPHOS 44 (L) 09/15/2013 0755   AST 25 03/22/2021 1220   AST 37  09/15/2013 0755   ALT 16 03/22/2021 1220   ALT 28 09/15/2013 0755   BILITOT 0.7 03/22/2021 1220   BILITOT 0.5 09/15/2013 0755       RADIOGRAPHIC STUDIES: I have personally reviewed the radiological images as listed and agreed with the findings in the report. No results found.   ASSESSMENT & PLAN:  Prostate cancer ([place]) # Castrate sensitive-metastatic prostate cancer to the bone. Stage IV-on Eligard; Bone scan October 2020-improved;  SEP 2022- PSA- <0.1. Proceed with eligard q4M today.   # Iron deficient anemia/CKD- III-[s/p EGD March 2021]--status post IV iron infusion x1 [FEB 2022]-today hemoglobin is 13.2  STABLE.  Hold off on infusion.  Continue oral iron.  #CKD stage III-GFR 52.  Clinically stable.  # A. fib paroxysmal-sinus rhythm; dizzy spells-; awaiting pp in end of Jan- aspirin Plavix- STABLE.   # DISPOSITION:  # Eligard today; HOLD  Venofer # Follow up in 4 months; MD;labs- cbc.cmp/PSA eligard; possible venofer- Dr.B  Cc; Dr.Scott/    Orders Placed This Encounter  Procedures   CBC with Differential/Platelet    Standing Status:   Future    Standing Expiration Date:   03/22/2022   Comprehensive metabolic panel    Standing Status:   Future    Standing Expiration Date:   03/22/2022   PSA    Standing Status:   Future    Standing Expiration Date:   03/22/2022   All questions were answered. The patient knows to call the clinic with any problems, questions or concerns.      Cammie Sickle, MD 03/22/2021 2:20 PM

## 2021-03-25 NOTE — Telephone Encounter (Signed)
Spoke with pt and advised pt he will now need to arrive to [facility] at 1230pm on 04/04/2021 for pacemaker implant.  Device Instruction letter updated and sent to pt on MyChart.  Pt verbalizes understanding and agrees with current plan.  See updated letter for complete details.

## 2021-03-30 ENCOUNTER — Other Ambulatory Visit: Payer: Self-pay | Admitting: Internal Medicine

## 2021-03-30 DIAGNOSIS — R001 Bradycardia, unspecified: Secondary | ICD-10-CM

## 2021-03-31 ENCOUNTER — Telehealth: Payer: Self-pay | Admitting: Internal Medicine

## 2021-03-31 NOTE — Telephone Encounter (Signed)
Reviewed the patient's chart. His PPM that is scheduled for 03/08/21 had been pushed back to a start time of 2:30 pm. Per Dr. Olin Pia nurse in [place], she had notified the patient of the time change.  I have clarified with Dr. Caryl Comes this morning by phone that the patient is ok to eat a light breakfast up until 6 hours prior to his procedure start time.  I have called and spoken with Mrs. Sakata. She is aware the patient will need to arrive at 12:30 pm for a 2:30 pm case on 04/04/21. She is also advised the patient may have a light breakfast up until 8:30 am the day of his procedure.  Otherwise all of his other prior instructions will stay the same.   Mrs. Monestime voices understanding and is agreeable. She was very appreciative of the call back.

## 2021-03-31 NOTE — Telephone Encounter (Signed)
Patient wife calling to confirm procedure time and instructions for fasting .  Please call

## 2021-04-01 NOTE — Pre-Procedure Instructions (Signed)
Attempted to call patient regarding procedure instructions for Monday.  No answer 

## 2021-04-04 ENCOUNTER — Ambulatory Visit (HOSPITAL_COMMUNITY): Payer: Medicare HMO

## 2021-04-04 ENCOUNTER — Encounter (HOSPITAL_COMMUNITY)
Admission: RE | Disposition: A | Discharge status: Home / Self Care | Payer: Medicare HMO | Source: Ambulatory Visit | Attending: Internal Medicine

## 2021-04-04 ENCOUNTER — Ambulatory Visit (HOSPITAL_COMMUNITY)
Admission: RE | Admit: 2021-04-04 | Discharge: 2021-04-04 | Disposition: A | Discharge status: Home / Self Care | Payer: Medicare HMO | Source: Ambulatory Visit | Attending: Internal Medicine | Admitting: Internal Medicine

## 2021-04-04 ENCOUNTER — Other Ambulatory Visit: Payer: Self-pay

## 2021-04-04 DIAGNOSIS — R001 Bradycardia, unspecified: Secondary | ICD-10-CM

## 2021-04-04 DIAGNOSIS — I4891 Unspecified atrial fibrillation: Secondary | ICD-10-CM | POA: Insufficient documentation

## 2021-04-04 DIAGNOSIS — Z7902 Long term (current) use of antithrombotics/antiplatelets: Secondary | ICD-10-CM | POA: Insufficient documentation

## 2021-04-04 DIAGNOSIS — I471 Supraventricular tachycardia: Secondary | ICD-10-CM | POA: Insufficient documentation

## 2021-04-04 DIAGNOSIS — Z7982 Long term (current) use of aspirin: Secondary | ICD-10-CM | POA: Diagnosis not present

## 2021-04-04 DIAGNOSIS — I495 Sick sinus syndrome: Secondary | ICD-10-CM | POA: Diagnosis not present

## 2021-04-04 DIAGNOSIS — Z87891 Personal history of nicotine dependence: Secondary | ICD-10-CM | POA: Insufficient documentation

## 2021-04-04 DIAGNOSIS — I5032 Chronic diastolic (congestive) heart failure: Secondary | ICD-10-CM | POA: Diagnosis not present

## 2021-04-04 DIAGNOSIS — R42 Dizziness and giddiness: Secondary | ICD-10-CM | POA: Insufficient documentation

## 2021-04-04 DIAGNOSIS — N183 Chronic kidney disease, stage 3 unspecified: Secondary | ICD-10-CM | POA: Diagnosis not present

## 2021-04-04 DIAGNOSIS — I130 Hypertensive heart and chronic kidney disease with heart failure and stage 1 through stage 4 chronic kidney disease, or unspecified chronic kidney disease: Secondary | ICD-10-CM | POA: Insufficient documentation

## 2021-04-04 DIAGNOSIS — Z79899 Other long term (current) drug therapy: Secondary | ICD-10-CM | POA: Insufficient documentation

## 2021-04-04 DIAGNOSIS — Z959 Presence of cardiac and vascular implant and graft, unspecified: Secondary | ICD-10-CM

## 2021-04-04 HISTORY — PX: PACEMAKER IMPLANT: EP1218

## 2021-04-04 SURGERY — PACEMAKER IMPLANT

## 2021-04-04 MED ORDER — CEFAZOLIN SODIUM-DEXTROSE 2-4 GM/100ML-% IV SOLN
INTRAVENOUS | Status: AC
  Filled 2021-04-04: qty 100

## 2021-04-04 MED ORDER — LIDOCAINE HCL 1 % IJ SOLN
INTRAMUSCULAR | Status: AC
  Filled 2021-04-04: qty 60

## 2021-04-04 MED ORDER — SODIUM CHLORIDE 0.9 % IV SOLN
80.0000 mg | INTRAVENOUS | Status: AC
  Administered 2021-04-04: 80 mg

## 2021-04-04 MED ORDER — SODIUM CHLORIDE 0.9 % IV SOLN
INTRAVENOUS | Status: AC
  Filled 2021-04-04: qty 2

## 2021-04-04 MED ORDER — FENTANYL CITRATE (PF) 100 MCG/2ML IJ SOLN
INTRAMUSCULAR | Status: AC
  Filled 2021-04-04: qty 2

## 2021-04-04 MED ORDER — LIDOCAINE HCL (PF) 1 % IJ SOLN
INTRAMUSCULAR | Status: DC | PRN
  Administered 2021-04-04: 60 mL

## 2021-04-04 MED ORDER — HEPARIN (PORCINE) IN NACL 1000-0.9 UT/500ML-% IV SOLN
INTRAVENOUS | Status: AC
  Filled 2021-04-04: qty 500

## 2021-04-04 MED ORDER — SODIUM CHLORIDE 0.9 % IV SOLN: INTRAVENOUS | Status: DC

## 2021-04-04 MED ORDER — HEPARIN (PORCINE) IN NACL 1000-0.9 UT/500ML-% IV SOLN
INTRAVENOUS | Status: DC | PRN
  Administered 2021-04-04: 500 mL

## 2021-04-04 MED ORDER — MIDAZOLAM HCL 5 MG/5ML IJ SOLN
INTRAMUSCULAR | Status: DC | PRN
  Administered 2021-04-04: .5 mg via INTRAVENOUS

## 2021-04-04 MED ORDER — MIDAZOLAM HCL 5 MG/5ML IJ SOLN
INTRAMUSCULAR | Status: AC
  Filled 2021-04-04: qty 5

## 2021-04-04 MED ORDER — ONDANSETRON HCL 4 MG/2ML IJ SOLN: 4.0000 mg | Freq: Four times a day (QID) | INTRAMUSCULAR | Status: DC | PRN

## 2021-04-04 MED ORDER — FENTANYL CITRATE (PF) 100 MCG/2ML IJ SOLN
INTRAMUSCULAR | Status: DC | PRN
  Administered 2021-04-04: 12.5 ug via INTRAVENOUS

## 2021-04-04 MED ORDER — CEFAZOLIN SODIUM-DEXTROSE 2-4 GM/100ML-% IV SOLN
2.0000 g | INTRAVENOUS | Status: AC
  Administered 2021-04-04: 2 g via INTRAVENOUS

## 2021-04-04 MED ORDER — ACETAMINOPHEN 325 MG PO TABS
325.0000 mg | ORAL_TABLET | ORAL | Status: DC | PRN
  Filled 2021-04-04: qty 2

## 2021-04-04 SURGICAL SUPPLY — 13 items
CABLE SURGICAL S-101-97-12 (CABLE) ×3 IMPLANT
CATH RIGHTSITE C315HIS02 (CATHETERS) ×1 IMPLANT
IPG PACE AZUR XT DR MRI W1DR01 (Pacemaker) IMPLANT
LEAD CAPSURE NOVUS 5076-52CM (Lead) ×1 IMPLANT
LEAD SELECT SECURE 3830 383069 (Lead) IMPLANT
MAT PREVALON FULL STRYKER (MISCELLANEOUS) ×1 IMPLANT
PACE AZURE XT DR MRI W1DR01 (Pacemaker) ×2 IMPLANT
PAD DEFIB RADIO PHYSIO CONN (PAD) ×3 IMPLANT
SELECT SECURE 3830 383069 (Lead) ×2 IMPLANT
SHEATH 7FR PRELUDE SNAP 13 (SHEATH) ×2 IMPLANT
SLITTER 6232ADJ (MISCELLANEOUS) ×1 IMPLANT
TRAY PACEMAKER INSERTION (PACKS) ×3 IMPLANT
WIRE HI TORQ VERSACORE-J 145CM (WIRE) ×1 IMPLANT

## 2021-04-04 NOTE — Discharge Instructions (Signed)
° ° °  Supplemental Discharge Instructions for  Pacemaker/Defibrillator Patients  Tomorrow, 04/05/21, send in a device transmission  Activity No heavy lifting or vigorous activity with your left/right arm for 6 to 8 weeks.  Do not raise your left/right arm above your head for one week.  Gradually raise your affected arm as drawn below.              04/09/21                     04/10/21                      04/11/21                   04/12/21 __   NO DRIVING for  1 week   ; you may begin driving on   07/10/79  .  WOUND CARE Keep the wound area clean and dry.  Do not get this area wet , no showers for 24hours; you may shower on  04/05/21 evening   . Tomorrow, 04/05/21, remove the arm sling Dr. Caryl Comes used DERMABOND to close your wound.  DO NOT peel this off.  Do not rub/scrub the area, pat dry. No bandage is needed on the site.  DO  NOT apply any creams, oils, or ointments to the wound area. If you notice any drainage or discharge from the wound, any swelling or bruising at the site, or you develop a fever > 101? F after you are discharged home, call the office at once.  Special Instructions You are still able to use cellular telephones; use the ear opposite the side where you have your pacemaker/defibrillator.  Avoid carrying your cellular phone near your device. When traveling through airports, show security personnel your identification card to avoid being screened in the metal detectors.  Ask the security personnel to use the hand wand. Avoid arc welding equipment, MRI testing (magnetic resonance imaging), TENS units (transcutaneous nerve stimulators).  Call the office for questions about other devices. Avoid electrical appliances that are in poor condition or are not properly grounded. Microwave ovens are safe to be near or to operate.

## 2021-04-05 ENCOUNTER — Encounter (HOSPITAL_COMMUNITY): Payer: Self-pay | Admitting: Internal Medicine

## 2021-04-05 ENCOUNTER — Telehealth: Payer: Self-pay

## 2021-04-05 NOTE — Telephone Encounter (Signed)
Follow-up after same day discharge: Implant date: 04/04/21 MD: Virl Axe, MD Device: MDT PPM Location: Left chest   Wound check visit: 04/14/21 8:40 90 day MD follow-up: 07/19/21 9:20  Remote Transmission received: Patient will be added to carelink and paceart  Dressing removed: NA     Successful telephone encounter to patient/wife to follow up on questions, concerns, symptoms post PPM implant yesterday. Patient states he is doing well. No swelling, drainage or redness at site. He is following driving and arm movement restrictions. He is aware of upcoming appointments as indicated above. He denies pain however complains of soreness. He is taking tylenol PRN. He may use ice pack however not directly on skin/incision. He is provided device clinic contact for additional questions or concerns that may arise.

## 2021-04-05 NOTE — Telephone Encounter (Signed)
-----   Message from [place], [place] sent at 04/04/2021  4:06 PM EST ----- Same day d/c  SK MDT  PPM

## 2021-04-12 ENCOUNTER — Other Ambulatory Visit: Payer: Self-pay | Admitting: Internal Medicine

## 2021-04-13 ENCOUNTER — Telehealth: Payer: Self-pay

## 2021-04-13 NOTE — Telephone Encounter (Signed)
Open in error

## 2021-04-14 ENCOUNTER — Ambulatory Visit (INDEPENDENT_AMBULATORY_CARE_PROVIDER_SITE_OTHER): Payer: Medicare HMO

## 2021-04-14 ENCOUNTER — Telehealth: Payer: Self-pay | Admitting: Internal Medicine

## 2021-04-14 ENCOUNTER — Other Ambulatory Visit: Payer: Self-pay

## 2021-04-14 DIAGNOSIS — I495 Sick sinus syndrome: Secondary | ICD-10-CM | POA: Diagnosis not present

## 2021-04-14 LAB — CUP PACEART INCLINIC DEVICE CHECK
Battery Remaining Longevity: 145 mo
Battery Voltage: 3.22 V
Brady Statistic AP VP Percent: 1.05 %
Brady Statistic AP VS Percent: 81.04 %
Brady Statistic AS VP Percent: 0.59 %
Brady Statistic AS VS Percent: 17.32 %
Brady Statistic RA Percent Paced: 83.12 %
Brady Statistic RV Percent Paced: 1.64 %
Date Time Interrogation Session: 20230209164456
Implantable Lead Implant Date: 20230130
Implantable Lead Implant Date: 20230130
Implantable Lead Location: 753859
Implantable Lead Location: 753860
Implantable Lead Model: 3830
Implantable Lead Model: 5076
Implantable Pulse Generator Implant Date: 20230130
Lead Channel Impedance Value: 342 Ohm
Lead Channel Impedance Value: 437 Ohm
Lead Channel Impedance Value: 456 Ohm
Lead Channel Impedance Value: 608 Ohm
Lead Channel Pacing Threshold Amplitude: 0.75 V
Lead Channel Pacing Threshold Amplitude: 0.75 V
Lead Channel Pacing Threshold Pulse Width: 0.4 ms
Lead Channel Pacing Threshold Pulse Width: 0.4 ms
Lead Channel Sensing Intrinsic Amplitude: 2.25 mV
Lead Channel Sensing Intrinsic Amplitude: 2.75 mV
Lead Channel Sensing Intrinsic Amplitude: 3.5 mV
Lead Channel Sensing Intrinsic Amplitude: 3.75 mV
Lead Channel Setting Pacing Amplitude: 3.5 V
Lead Channel Setting Pacing Amplitude: 3.5 V
Lead Channel Setting Pacing Pulse Width: 0.4 ms
Lead Channel Setting Sensing Sensitivity: 0.9 mV

## 2021-04-14 NOTE — Progress Notes (Signed)
Wound check appointment. Dermabond removed. Wound without redness or edema. Incision edges approximated, wound well healed. Normal device function. Thresholds, sensing, and impedances consistent with implant measurements. Device programmed at 3.5V/auto capture programmed on for extra safety margin until 3 month visit. Histogram distribution appropriate for patient and level of activity. 1 AT/AF episode 0.7% AT/Af burden, episode 1hr55min in duration patient asymptomatic. No high ventricular rates noted. Patient educated about wound care, arm mobility, lifting restrictions. ROV 07/19/21 with SK

## 2021-04-14 NOTE — Patient Instructions (Signed)
° °  After Your Pacemaker   Monitor your pacemaker site for redness, swelling, and drainage. Call the device clinic at [phone] if you experience these symptoms or fever/chills.  Your incision was closed with Dermabond:  You may shower 1 day after your defibrillator implant and wash your incision with soap and water. Avoid lotions, ointments, or perfumes over your incision until it is well-healed.   Do not lift, push or pull greater than 10 pounds with the affected arm until 6 weeks after your procedure, May 16, 2021. There are no other restrictions in arm movement after your wound check appointment.  You may drive, unless driving has been restricted by your healthcare providers.  Your Pacemaker is MRI compatible.  Remote monitoring is used to monitor your pacemaker from home. This monitoring is scheduled every 91 days by our office. It allows [place] to keep an eye on the functioning of your device to ensure it is working properly. You will routinely see your Electrophysiologist annually (more often if necessary).

## 2021-04-14 NOTE — Telephone Encounter (Signed)
Pt wife called in stating that Pt hasn't had lab work in a while. Pt wife would like to know if Dr. Nicki Reaper would like to check Pt cholesterol. Pt wife stated that pt hasn't seen Dr. Nicki Reaper in a while and was wondering if she would like pt to do a follow up appt. Pt requesting callback

## 2021-04-15 NOTE — Telephone Encounter (Signed)
Nathaniel Thompson is going to schedule

## 2021-04-15 NOTE — Telephone Encounter (Signed)
We saw him in Nov. Sent to ED so no check out note. 4 months would be March. Do you want to schedule fasting lab and follow up some time in March?

## 2021-04-15 NOTE — Telephone Encounter (Signed)
He does need a f/u appt and fasting labs.  Ok to schedule.

## 2021-04-17 NOTE — H&P (Signed)
No interval change.

## 2021-05-09 ENCOUNTER — Other Ambulatory Visit: Payer: Self-pay | Admitting: Cardiovascular Disease

## 2021-05-19 ENCOUNTER — Telehealth: Payer: Self-pay | Admitting: Internal Medicine

## 2021-05-19 DIAGNOSIS — R739 Hyperglycemia, unspecified: Secondary | ICD-10-CM

## 2021-05-19 DIAGNOSIS — E7800 Pure hypercholesterolemia, unspecified: Secondary | ICD-10-CM

## 2021-05-19 DIAGNOSIS — I10 Essential (primary) hypertension: Secondary | ICD-10-CM

## 2021-05-19 NOTE — Telephone Encounter (Signed)
Pt has a lab appt 05/23/2021, there are no orders in. ?

## 2021-05-19 NOTE — Telephone Encounter (Signed)
Needs labs

## 2021-05-20 NOTE — Addendum Note (Signed)
Addended by: Lars Masson on: 05/20/2021 03:27 PM ? ? Modules accepted: Orders ? ?

## 2021-05-20 NOTE — Telephone Encounter (Signed)
Labs ordered.

## 2021-05-23 ENCOUNTER — Other Ambulatory Visit: Payer: Self-pay

## 2021-05-23 ENCOUNTER — Other Ambulatory Visit (INDEPENDENT_AMBULATORY_CARE_PROVIDER_SITE_OTHER): Payer: Medicare HMO

## 2021-05-23 DIAGNOSIS — R739 Hyperglycemia, unspecified: Secondary | ICD-10-CM | POA: Diagnosis not present

## 2021-05-23 DIAGNOSIS — E7800 Pure hypercholesterolemia, unspecified: Secondary | ICD-10-CM | POA: Diagnosis not present

## 2021-05-23 DIAGNOSIS — I10 Essential (primary) hypertension: Secondary | ICD-10-CM

## 2021-05-23 LAB — HEPATIC FUNCTION PANEL
ALT: 11 U/L (ref 0–53)
AST: 17 U/L (ref 0–37)
Albumin: 4.3 g/dL (ref 3.5–5.2)
Alkaline Phosphatase: 61 U/L (ref 39–117)
Bilirubin, Direct: 0.2 mg/dL (ref 0.0–0.3)
Total Bilirubin: 0.7 mg/dL (ref 0.2–1.2)
Total Protein: 6.7 g/dL (ref 6.0–8.3)

## 2021-05-23 LAB — BASIC METABOLIC PANEL
BUN: 26 mg/dL — ABNORMAL HIGH (ref 6–23)
CO2: 26 mEq/L (ref 19–32)
Calcium: 9.6 mg/dL (ref 8.4–10.5)
Chloride: 105 mEq/L (ref 96–112)
Creatinine, Ser: 1.31 mg/dL (ref 0.40–1.50)
GFR: 50.35 mL/min — ABNORMAL LOW (ref >60.00)
Glucose, Bld: 102 mg/dL — ABNORMAL HIGH (ref 70–99)
Potassium: 4 mEq/L (ref 3.5–5.1)
Sodium: 140 mEq/L (ref 135–145)

## 2021-05-23 LAB — LIPID PANEL
Cholesterol: 118 mg/dL (ref 0–200)
HDL: 33.8 mg/dL — ABNORMAL LOW (ref >39.00)
LDL Cholesterol: 62 mg/dL (ref 0–99)
NonHDL: 83.98
Total CHOL/HDL Ratio: 3
Triglycerides: 112 mg/dL (ref 0.0–149.0)
VLDL: 22.4 mg/dL (ref 0.0–40.0)

## 2021-05-23 LAB — HEMOGLOBIN A1C: Hgb A1c MFr Bld: 5.9 % (ref 4.6–6.5)

## 2021-05-25 ENCOUNTER — Other Ambulatory Visit: Payer: Self-pay

## 2021-05-25 ENCOUNTER — Encounter: Payer: Self-pay | Admitting: Internal Medicine

## 2021-05-25 ENCOUNTER — Ambulatory Visit (INDEPENDENT_AMBULATORY_CARE_PROVIDER_SITE_OTHER): Payer: Medicare HMO | Admitting: Internal Medicine

## 2021-05-25 VITALS — BP 112/68 | HR 72 | Temp 98.0°F | Ht 70.0 in | Wt 217.0 lb

## 2021-05-25 DIAGNOSIS — E7800 Pure hypercholesterolemia, unspecified: Secondary | ICD-10-CM | POA: Diagnosis not present

## 2021-05-25 DIAGNOSIS — I714 Abdominal aortic aneurysm, without rupture, unspecified: Secondary | ICD-10-CM

## 2021-05-25 DIAGNOSIS — K219 Gastro-esophageal reflux disease without esophagitis: Secondary | ICD-10-CM

## 2021-05-25 DIAGNOSIS — D649 Anemia, unspecified: Secondary | ICD-10-CM

## 2021-05-25 DIAGNOSIS — J449 Chronic obstructive pulmonary disease, unspecified: Secondary | ICD-10-CM

## 2021-05-25 DIAGNOSIS — K2270 Barrett's esophagus without dysplasia: Secondary | ICD-10-CM

## 2021-05-25 DIAGNOSIS — I724 Aneurysm of artery of lower extremity: Secondary | ICD-10-CM

## 2021-05-25 DIAGNOSIS — R739 Hyperglycemia, unspecified: Secondary | ICD-10-CM | POA: Diagnosis not present

## 2021-05-25 DIAGNOSIS — I10 Essential (primary) hypertension: Secondary | ICD-10-CM | POA: Diagnosis not present

## 2021-05-25 DIAGNOSIS — I6523 Occlusion and stenosis of bilateral carotid arteries: Secondary | ICD-10-CM

## 2021-05-25 DIAGNOSIS — I25110 Atherosclerotic heart disease of native coronary artery with unstable angina pectoris: Secondary | ICD-10-CM | POA: Diagnosis not present

## 2021-05-25 DIAGNOSIS — G4730 Sleep apnea, unspecified: Secondary | ICD-10-CM

## 2021-05-25 DIAGNOSIS — I5032 Chronic diastolic (congestive) heart failure: Secondary | ICD-10-CM

## 2021-05-25 DIAGNOSIS — N1831 Chronic kidney disease, stage 3a: Secondary | ICD-10-CM

## 2021-05-25 NOTE — Progress Notes (Signed)
Patient ID: Nathaniel Thompson, male   DOB: [birth year], 86 y.o.   MRN: [id number] ? ? ?Subjective:  ? ? Patient ID: Nathaniel Thompson, male    DOB: Dec 29, 1935, 86 y.o.   MRN: [id number] ? ?This visit occurred during the SARS-CoV-2 public health emergency.  Safety protocols were in place, including screening questions prior to the visit, additional usage of staff PPE, and extensive cleaning of exam room while observing appropriate contact time as indicated for disinfecting solutions.  ? ?Patient here for a scheduled follow up.  ? ?Chief Complaint  ?Patient presents with  ? Follow-up  ?  4 mo f/u - Hypertension. Pt reports feeling well ! Pt had pacemaker put in, states has been feeling great. Patient has been walking 9mle per day on average. Reports some S.O.B while walking, but able to recover quickly.   ? .  ? ?HPI ? R/L heart catheterization on 02/07/2021 showed patent stents to LCx and RCA, no evidence of obstructive CAD, mildly elevated filling pressures and mild pulmonary hypertension with normal cardiac output, normal LV function. Zio monitor from 02/2021 showed frequent episodes of SVT as well as atrial fibrillation, HR min 42, max 203 bpm, with inconsistency between triggered events and arrhythmias.  Not a candidate for beta-blocker therapy given bradycardia. Saw Dr KCaryl Comes  Is now s/p pacemaker placement.  Feeling better.  Doing well.  Denies any dizziness or light headedness.  No chest pain.  Breathing stable.  Walking more.  Eating.  No nausea or vomiting.  Seeing Dr BRogue Bussingfor f/u prostate cancer.  Treated with eligard.  Continuing oral iron for IDA.   ? ? ?Past Medical History:  ?Diagnosis Date  ? (HFpEF) heart failure with preserved ejection fraction ([place]   ? a. 05/2018 Echo: Nl EF, Gr1 DD, sev dil LA, mild MR/TR, mild PAH; b. 05/2019 Echo: EF 55-60%, no rwma, mild LVH. Nl PASP. Mildly dil LA. Triv MR. Mild AoV sclerosis w/o stenosis. Mildly dil Asc AO (381m.  ? Allergic rhinitis   ? Barrett's esophagus  10/21/2013  ? Bone cancer ([facility] [place]  ? CKD (chronic kidney disease), stage III ([place]CR[place]  ? Colon polyp, hyperplastic   ? COPD (chronic obstructive pulmonary disease) ([place]  ? mild COPD. former smoker  ? Coronary artery disease   ? a. 2003 s/p PCI ([facility]); b. 2004 s/p PCI x 2 ([facility]); c. 11/2017 Cath: LM nl, LAD mild dzs, LCX patent stent w/ 30% distal edge restenosis, RCA patent distal stent w/ 30% prox edge stenosis. Nl EF->Med Rx;  ? Diverticulosis   ? Fundic gland polyps of stomach, benign   ? Gastritis   ? GERD (gastroesophageal reflux disease)   ? Hypercholesteremia   ? Hypertension   ? Prostate cancer ([place]  ? Prostatism   ? Reflux esophagitis   ? Renal stones   ? Skin cancer   ? left cheek/lesion excised  ? Sleep apnea   ? Tachycardia   ? a. 11/2017 admit w/ tachycardia/? afib-->no afib noted upon review (amio/OAC d/c'd).  ? TIA (transient ischemic attack)   ? ?Past Surgical History:  ?Procedure Laterality Date  ? CARDIAC CATHETERIZATION    ? COLONOSCOPY WITH PROPOFOL N/A 09/13/2015  ? Procedure: COLONOSCOPY WITH PROPOFOL;  Surgeon: MaLollie SailsMD;  [facility];  Service: Endoscopy;  Laterality: N/A;  ? COLONOSCOPY WITH PROPOFOL N/A 05/07/2019  ? Procedure: COLONOSCOPY WITH PROPOFOL;  Surgeon: ByRobert BellowMD;  [facility];  Service: Endoscopy;  Laterality: N/A;  ? CORONARY ANGIOPLASTY    ? ESOPHAGOGASTRODUODENOSCOPY (EGD) WITH PROPOFOL N/A 09/13/2015  ? Procedure: ESOPHAGOGASTRODUODENOSCOPY (EGD) WITH PROPOFOL;  Surgeon: Lollie Sails, MD;  [facility];  Service: Endoscopy;  Laterality: N/A;  ? ESOPHAGOGASTRODUODENOSCOPY (EGD) WITH PROPOFOL N/A 12/28/2015  ? Procedure: ESOPHAGOGASTRODUODENOSCOPY (EGD) WITH PROPOFOL;  Surgeon: Lollie Sails, MD;  Location: [facility] ENDOSCOPY;  Service: Endoscopy;  Laterality: N/A;  ? ESOPHAGOGASTRODUODENOSCOPY (EGD) WITH PROPOFOL N/A 06/16/2016  ? Procedure: ESOPHAGOGASTRODUODENOSCOPY (EGD) WITH PROPOFOL;  Surgeon: Lollie Sails, MD;  Location: [facility] END[facility]COPY;  Service: Endoscopy;  Laterality: N/A;  ? ESOPHAGOGASTRODUODENOSCOPY (EGD) WITH PROPOFOL N/A 05/07/2019  ? Procedure: ESOPHAGOGASTRODUODENOSCOPY (EGD) WITH PROPOFOL;  Surgeon: Robert Bellow, MD;  Location: [facility] ENDOSCOPY;  Service: Endoscopy;  Laterality: N/A;  ? EYE SURGERY  2013  ? CATARACT EXTRACTION  ? GANGLION CYST EXCISION Right 05/18/2017  ? Procedure: REMOVAL GANGLION CYST ANKLE;  Surgeon: Samara Deist, DPM;  Location: [facility] ORS;  Service: Podiatry;  Laterality: Right;  ? heart cath stent    ? LEFT HEART CATH AND CORONARY ANGIOGRAPHY Right 12/03/2017  ? Procedure: Left Heart Cath and Coronary Angiography with possible coronary intervention;  Surgeon: Dionisio David, MD;  Location: [place] CV LAB;  Service: Cardiovascular;  Laterality: Right;  ? PACEMAKER IMPLANT N/A 04/04/2021  ? Procedure: PACEMAKER IMPLANT;  Surgeon: Deboraha Sprang, MD;  Location: [place] CV LAB;  Service: Cardiovascular;  Laterality: N/A;  ? PROSTATE BIOPSY    ? RIGHT/LEFT HEART CATH AND CORONARY ANGIOGRAPHY N/A 02/07/2021  ? Procedure: RIGHT/LEFT HEART CATH AND CORONARY ANGIOGRAPHY;  Surgeon: [name], MD;  Location: [place] CV LAB;  Service: Cardiovascular;  Laterality: N/A;  ? stents    ? multiple  ? ?Family History  ?Problem Relation Age of Onset  ? Cancer Mother   ?     gastric and lung  ? Cancer Father   ?     multiple myeloma  ? Stroke Father   ? Cancer Sister   ?     leukemia  ? Cancer Brother   ?     leukemia  ? Cancer Brother   ?     kidney  ? Cancer Daughter   ?     Uterine  ? Cancer Other   ?     [place] Son): Prostate  ? ?Social History  ? ?Socioeconomic History  ? Marital status: Married  ?  Spouse name: Not on file  ? Number of children: Not on file  ? Years of education: Not on file  ? Highest education level: Not on file  ?Occupational History  ? Not on file  ?Tobacco Use  ? Smoking status: Former  ?  Packs/day: 1.00  ?  Years: 35.00  ?  Pack  years: 35.00  ?  Types: Cigarettes  ?  Quit date: 03/06/1986  ?  Years since quitting: 35.2  ? Smokeless tobacco: Former  ?  Types: Chew  ?Vaping Use  ? Vaping Use: Never used  ?Substance and Sexual Activity  ? Alcohol use: No  ? Drug use: No  ? Sexual activity: Not Currently  ?Other Topics Concern  ? Not on file  ?Social History Narrative  ? Not on file  ? ?Social Determinants of Health  ? ?Financial Resource Strain: Low Risk   ? Difficulty of Paying Living Expenses: Not hard at all  ?Food Insecurity: No Food Insecurity  ? Worried About Charity fundraiser in the  Last Year: Never true  ? Ran Out of Food in the Last Year: Never true  ?Transportation Needs: No Transportation Needs  ? Lack of Transportation (Medical): No  ? Lack of Transportation (Non-Medical): No  ?Physical Activity: Unknown  ? Days of Exercise per Week: 0 days  ? Minutes of Exercise per Session: Not on file  ?Stress: No Stress Concern Present  ? Feeling of Stress : Not at all  ?Social Connections: Unknown  ? Frequency of Communication with Friends and Family: Not on file  ? Frequency of Social Gatherings with Friends and Family: Not on file  ? Attends Religious Services: Not on file  ? Active Member of Clubs or Organizations: Not on file  ? Attends Archivist Meetings: Not on file  ? Marital Status: Married  ? ? ? ?Review of Systems  ?Constitutional:  Negative for appetite change and unexpected weight change.  ?HENT:  Negative for congestion and sinus pressure.   ?Respiratory:  Negative for cough and chest tightness.   ?     No increased sob.   ?Cardiovascular:  Negative for chest pain, palpitations and leg swelling.  ?Gastrointestinal:  Negative for abdominal pain, diarrhea, nausea and vomiting.  ?Genitourinary:  Negative for difficulty urinating and dysuria.  ?Musculoskeletal:  Negative for joint swelling and myalgias.  ?Skin:  Negative for color change and rash.  ?Neurological:  Negative for dizziness, light-headedness and headaches.   ?Psychiatric/Behavioral:  Negative for agitation and dysphoric mood.   ? ?   ?Objective:  ?  ? ?BP 112/68 (BP Location: Left Arm, Patient Position: Sitting, Cuff Size: Large)   Pulse 72   Temp 98 ?F (36.7 ?C) (Oral)

## 2021-05-30 ENCOUNTER — Encounter: Payer: Self-pay | Admitting: Internal Medicine

## 2021-05-30 NOTE — Assessment & Plan Note (Signed)
Follow met b and a1c.  ?

## 2021-05-30 NOTE — Assessment & Plan Note (Signed)
Continue protonix  

## 2021-05-30 NOTE — Assessment & Plan Note (Signed)
Blood pressure as outlined.  On imdur, amlodipine and losartan.  Continue to follow pressure.  Continue to monitor metabolic panel.   

## 2021-05-30 NOTE — Assessment & Plan Note (Addendum)
Followed by hematology.  Has received IV iron.  Held last visit.   Follow.  

## 2021-05-30 NOTE — Assessment & Plan Note (Signed)
No chest pain.  Breathing stable.  Feels better since PM placed.  Follow.  ?

## 2021-05-30 NOTE — Assessment & Plan Note (Signed)
[facility] evaluated 01/21/21 - recommended f/u in 12 months.  

## 2021-05-30 NOTE — Assessment & Plan Note (Signed)
Evaluated by [facility].  Evaluated 01/2020.  Stable.  Recommended f/u scanning in 2 years.  

## 2021-05-30 NOTE — Assessment & Plan Note (Signed)
Breathing stable.

## 2021-05-30 NOTE — Assessment & Plan Note (Signed)
Followed by GI.  EGD 05/2019.  Continue PPI.  ?

## 2021-05-30 NOTE — Assessment & Plan Note (Signed)
Decreased GFR - 50 on recent check.  Continue to avoid antiinflammatories.  Follow met b.  ?

## 2021-05-30 NOTE — Assessment & Plan Note (Signed)
Continue cpap.  

## 2021-05-30 NOTE — Assessment & Plan Note (Signed)
Followed by cardiology.  ECHO - 05/2019 - EF 55-60%.  Continues on losartan, imdur and amlodipine.  Blood pressure doing well.  Stable.  

## 2021-05-30 NOTE — Assessment & Plan Note (Signed)
Continue lovastatin.  Follow lipid panel and liver function tests.   

## 2021-05-30 NOTE — Assessment & Plan Note (Signed)
Saw [facility] 01/2020.  Stable.  Recommended f/u in one year.  Carotid ultrasound 01/2021:  Right Carotid: Velocities in the right ICA are consistent with a 1-39% stenosis. Left Carotid: Velocities in the left ICA are consistent with a 1-39% stenosis. Vertebrals: Bilateral vertebral arteries demonstrate antegrade flow. 

## 2021-06-06 ENCOUNTER — Other Ambulatory Visit: Payer: Self-pay | Admitting: Internal Medicine

## 2021-06-21 ENCOUNTER — Encounter: Payer: Self-pay | Admitting: Cardiovascular Disease

## 2021-06-21 ENCOUNTER — Ambulatory Visit: Payer: Medicare HMO | Admitting: Cardiovascular Disease

## 2021-06-21 VITALS — BP 118/60 | HR 80 | Ht 70.0 in | Wt 218.0 lb

## 2021-06-21 DIAGNOSIS — I10 Essential (primary) hypertension: Secondary | ICD-10-CM | POA: Diagnosis not present

## 2021-06-21 DIAGNOSIS — I2510 Atherosclerotic heart disease of native coronary artery without angina pectoris: Secondary | ICD-10-CM

## 2021-06-21 DIAGNOSIS — I714 Abdominal aortic aneurysm, without rupture, unspecified: Secondary | ICD-10-CM

## 2021-06-21 DIAGNOSIS — E785 Hyperlipidemia, unspecified: Secondary | ICD-10-CM | POA: Diagnosis not present

## 2021-06-21 DIAGNOSIS — I5032 Chronic diastolic (congestive) heart failure: Secondary | ICD-10-CM | POA: Diagnosis not present

## 2021-06-21 MED ORDER — AMLODIPINE BESYLATE 2.5 MG PO TABS: 2.5000 mg | ORAL_TABLET | Freq: Every day | ORAL | 2 refills | Status: DC

## 2021-06-21 NOTE — Progress Notes (Signed)
?  ?Cardiology Office Note ? ? ?Date:  06/21/2021  ? ?ID:  Nathaniel Thompson, DOB 05-20-1935, MRN [id number] ? ?PCP:  Einar Pheasant, MD  ?Cardiologist:   Kathlyn Sacramento, MD  ? ?Chief Complaint  ?Patient presents with  ? Other  ?  3 month f/u no complaints today. Meds reviewed verbally with pt.  ? ? ? ? ?  ?History of Present Illness: ?Nathaniel Thompson is a 86 y.o. male who presents for a follow-up visit regarding coronary artery disease.   ?He has known history of coronary artery disease status post stenting of the left circumflex and RCA.  He had initial PCI done in 2003 at [facility].  He had repeat PCI x2 at [facility] in 2004.  Most recent cardiac catheterization in 2019 was personally reviewed by me and showed patent left circumflex stent with 30% distal edge restenosis, mild LAD disease, patent distal RCA stent with 30% proximal stenosis.  Ejection fraction was normal. ?He quit smoking in the 80s.  He reports having TIA in the past.  Other medical problems include metastatic prostate cancer, small abdominal aortic aneurysm followed by vascular surgery, hyperlipidemia and essential hypertension. ?He was hospitalized in September 2019 with questionable tachycardia and atrial fibrillation.  Initially was placed on amiodarone and anticoagulation but all his EKGs revealed sinus rhythm at that time and thus both amiodarone and Eliquis were discontinued.  No recurrent arrhythmia since then. ? ?  Echocardiogram in March 2020 showed normal LV systolic function with grade 1 diastolic dysfunction, severely dilated left atrium, mild mitral and tricuspid regurgitation and mild pulmonary hypertension. ? ?He was seen in December for worsening chest pain and shortness of breath.  Proceeded with cardiac catheterization on February 07, 2021 which showed patent stents in the left circumflex and right coronary artery with no obstructive coronary artery disease.  Right heart catheterization showed mildly elevated filling pressures, mild  pulmonary hypertension and normal cardiac output.  The patient was noted to have intermittent bradycardia during cardiac catheterization with heart rate going to the low 40s.  He also reported recent episodes of dizziness and presyncope.  He underwent outpatient monitor which showed frequent episodes of SVT.  He was referred to EP and underwent pacemaker placement.  In addition, he was placed on furosemide for diastolic heart failure. ?Since pacemaker placement, he felt significantly better with significant improvement in dizziness.  No more chest pain.  His shortness of breath improved. ? ? ?Past Medical History:  ?Diagnosis Date  ? (HFpEF) heart failure with preserved ejection fraction ([place])   ? a. 05/2018 Echo: Nl EF, Gr1 DD, sev dil LA, mild MR/TR, mild PAH; b. 05/2019 Echo: EF 55-60%, no rwma, mild LVH. Nl PASP. Mildly dil LA. Triv MR. Mild AoV sclerosis w/o stenosis. Mildly dil Asc AO (51m).  ? Allergic rhinitis   ? Barrett's esophagus 10/21/2013  ? Bone cancer ([place]   ? CKD (chronic kidney disease), stage III ([place]   ? Colon polyp, hyperplastic   ? COPD (chronic obstructive pulmonary disease) ([place]   ? mild COPD. former smoker  ? Coronary artery disease   ? a. 2003 s/p PCI ([facility]); b. 2004 s/p PCI x 2 ([facility]); c. 11/2017 Cath: LM nl, LAD mild dzs, LCX patent stent w/ 30% distal edge restenosis, RCA patent distal stent w/ 30% prox edge stenosis. Nl EF->Med Rx;  ? Diverticulosis   ? Fundic gland polyps of stomach, benign   ? Gastritis   ? GERD (gastroesophageal reflux disease)   ?  Hypercholesteremia   ? Hypertension   ? Prostate cancer ([place])   ? Prostatism   ? Reflux esophagitis   ? Renal stones   ? Skin cancer   ? left cheek/lesion excised  ? Sleep apnea   ? Tachycardia   ? a. 11/2017 admit w/ tachycardia/? afib-->no afib noted upon review (amio/OAC d/c'd).  ? TIA (transient ischemic attack)   ? ? ?Past Surgical History:  ?Procedure Laterality Date  ? CARDIAC CATHETERIZATION    ? COLONOSCOPY WITH PROPOFOL N/A  09/13/2015  ? Procedure: COLONOSCOPY WITH PROPOFOL;  Surgeon: Lollie Sails, MD;  Location: [facility] ENDOSCOPY;  Service: Endoscopy;  Laterality: N/A;  ? COLONOSCOPY WITH PROPOFOL N/A 05/07/2019  ? Procedure: COLONOSCOPY WITH PROPOFOL;  Surgeon: Robert Bellow, MD;  [facility] END[facility]COPY;  Service: Endoscopy;  Laterality: N/A;  ? CORONARY ANGIOPLASTY    ? ESOPHAGOGASTRODUODENOSCOPY (EGD) WITH PROPOFOL N/A 09/13/2015  ? Procedure: ESOPHAGOGASTRODUODENOSCOPY (EGD) WITH PROPOFOL;  Surgeon: Lollie Sails, MD;  Location: [facility] ENDOSCOPY;  Service: Endoscopy;  Laterality: N/A;  ? ESOPHAGOGASTRODUODENOSCOPY (EGD) WITH PROPOFOL N/A 12/28/2015  ? Procedure: ESOPHAGOGASTRODUODENOSCOPY (EGD) WITH PROPOFOL;  Surgeon: Lollie Sails, MD;  Location[facility] ENDOSCOPY;  Service: Endoscopy;  Laterality: N/A;  ? ESOPHAGOGASTRODUODENOSCOPY (EGD) WITH PROPOFOL N/A 06/16/2016  ? Procedure: ESOPHAGOGASTRODUODENOSCOPY (EGD) WITH PROPOFOL;  Surgeon: Lollie Sails, MD;  Location: [facility] ENDOSCOPY;  Service: Endoscopy;  Laterality: N/A;  ? ESOPHAGOGASTRODUODENOSCOPY (EGD) WITH PROPOFOL N/A 05/07/2019  ? Procedure: ESOPHAGOGASTRODUODENOSCOPY (EGD) WITH PROPOFOL;  Surgeon: Robert Bellow, MD;  Location: [facility] ENDOSCOPY;  Service: Endoscopy;  Laterality: N/A;  ? EYE SURGERY  2013  ? CATARACT EXTRACTION  ? GANGLION CYST EXCISION Right 05/18/2017  ? Procedure: REMOVAL GANGLION CYST ANKLE;  Surgeon: Samara Deist, DPM;  Location: [facility] ORS;  Service: Podiatry;  Laterality: Right;  ? heart cath stent    ? LEFT HEART CATH AND CORONARY ANGIOGRAPHY Right 12/03/2017  ? Procedure: Left Heart Cath and Coronary Angiography with possible coronary intervention;  Surgeon: Dionisio David, MD;  Location: [place] CV LAB;  Service: Cardiovascular;  Laterality: Right;  ? PACEMAKER IMPLANT N/A 04/04/2021  ? Procedure: PACEMAKER IMPLANT;  Surgeon: Deboraha Sprang, MD;  Location: [place] CV LAB;  Service: Cardiovascular;  Laterality: N/A;  ?  PROSTATE BIOPSY    ? RIGHT/LEFT HEART CATH AND CORONARY ANGIOGRAPHY N/A 02/07/2021  ? Procedure: RIGHT/LEFT HEART CATH AND CORONARY ANGIOGRAPHY;  Surgeon: [name], MD;  Location: [place] CV LAB;  Service: Cardiovascular;  Laterality: N/A;  ? stents    ? multiple  ? ? ? ?Current Outpatient Medications  ?Medication Sig Dispense Refill  ? aspirin EC 81 MG tablet Take 81 mg by mouth daily.    ? azelastine (ASTELIN) 0.1 % nasal spray Place 1 spray into both nostrils as needed for rhinitis. Use in each nostril as directed    ? Calcium Carb-Cholecalciferol (CALCIUM + D3 PO) Take 1 tablet by mouth daily.    ? cholecalciferol (VITAMIN D) 1000 units tablet Take 1,000 Units by mouth daily.     ? clopidogrel (PLAVIX) 75 MG tablet TAKE 1 TABLET BY MOUTH ONCE DAILY 90 tablet 0  ? ferrous sulfate 324 MG TBEC Take 324 mg by mouth daily.    ? fexofenadine (ALLEGRA) 180 MG tablet Take 180 mg by mouth daily as needed for allergies or rhinitis.     ? furosemide (LASIX) 20 MG tablet Take 1 tablet (20 mg total) by mouth daily. 30 tablet 3  ?  isosorbide mononitrate (IMDUR) 30 MG 24 hr tablet TAKE 1 TABLET BY MOUTH ONCE DAILY 90 tablet 2  ? Leuprolide Acetate, 3 Month, (ELIGARD) 22.5 MG injection Inject 22.5 mg into the skin every 4 (four) months.    ? losartan (COZAAR) 50 MG tablet Take 1 tablet (50 mg total) by mouth daily. 90 tablet 1  ? lovastatin (MEVACOR) 40 MG tablet TAKE 2 TABLETS BY MOUTH AT BEDTIME 180 tablet 1  ? Multiple Vitamin (MULTI-VITAMINS) TABS Take 1 tablet by mouth daily.     ? pantoprazole (PROTONIX) 40 MG tablet Take 40 mg by mouth 2 (two) times daily.     ? amLODipine (NORVASC) 2.5 MG tablet Take 1 tablet (2.5 mg total) by mouth daily. 90 tablet 2  ? ?No current facility-administered medications for this visit.  ? ? ?Allergies:   Atorvastatin  ? ? ?Social History:  The patient  reports that he quit smoking about 35 years ago. His smoking use included cigarettes. He has a 35.00 pack-year smoking  history. He has quit using smokeless tobacco.  His smokeless tobacco use included chew. He reports that he does not drink alcohol and does not use drugs.  ? ?Family History:  The patient's family history includes Cancer i

## 2021-06-21 NOTE — Patient Instructions (Signed)
Medication Instructions:  ?Your physician has recommended you make the following change in your medication:  ? ?REDUCE Amlodipine to 2.5 mg daily. An Rx has been sent to your pharmacy. ? ? ?*If you need a refill on your cardiac medications before your next appointment, please call your pharmacy* ? ? ?Lab Work: ?None ordered ?If you have labs (blood work) drawn today and your tests are completely normal, you will receive your results only by: ?MyChart Message (if you have MyChart) OR ?A paper copy in the mail ?If you have any lab test that is abnormal or we need to change your treatment, we will call you to review the results. ? ? ?Testing/Procedures: ?None ordered ? ? ?Follow-Up: ?At [facility], you and your health needs are our priority.  As part of our continuing mission to provide you with exceptional heart care, we have created designated Provider Care Teams.  These Care Teams include your primary Cardiologist (physician) and Advanced Practice Providers (APPs -  Physician Assistants and Nurse Practitioners) who all work together to provide you with the care you need, when you need it. ? ?We recommend signing up for the patient portal called "MyChart".  Sign up information is provided on this After Visit Summary.  MyChart is used to connect with patients for Virtual Visits (Telemedicine).  Patients are able to view lab/test results, encounter notes, upcoming appointments, etc.  Non-urgent messages can be sent to your provider as well.   ?To learn more about what you can do with MyChart, go to [email or web address].   ? ?Your next appointment:   ?Your physician wants you to follow-up in: 6 months You will receive a reminder letter in the mail two months in advance. If you don't receive a letter, please call our office to schedule the follow-up appointment. ? ? ?The format for your next appointment:   ?In Person ? ?Provider:   ?You may see Kathlyn Sacramento, MD or one of the following Advanced Practice Providers  on your designated Care Team:   ?Murray Hodgkins, NP ?Christell Faith, PA-C ?Cadence Kathlen Mody, PA-C ? ? ?Other Instructions ?N/A ? ?Important Information About Sugar ? ? ? ? ? ? ?

## 2021-07-06 ENCOUNTER — Other Ambulatory Visit: Payer: Self-pay

## 2021-07-06 ENCOUNTER — Telehealth: Payer: Self-pay | Admitting: Cardiovascular Disease

## 2021-07-06 MED ORDER — FUROSEMIDE 20 MG PO TABS
20.0000 mg | ORAL_TABLET | Freq: Every day | ORAL | 4 refills | Status: DC
Start: 2021-07-06 — End: 2021-10-05

## 2021-07-06 NOTE — Telephone Encounter (Signed)
?*  STAT* If patient is at the pharmacy, call can be transferred to refill team. ? ? ?1. Which medications need to be refilled? (please list name of each medication and dose if known)  ?furosemide (LASIX) 20 MG tablet ? ?2. Which pharmacy/location (including street and city if local pharmacy) is medication to be sent to? ?[place], [place] ? ? ?3. Do they need a 30 day or 90 day supply?  ? ?90 day supply ?Patient's wife states the patient will be out of medication on after today. ? ?

## 2021-07-12 ENCOUNTER — Ambulatory Visit (INDEPENDENT_AMBULATORY_CARE_PROVIDER_SITE_OTHER): Payer: Medicare HMO

## 2021-07-12 DIAGNOSIS — I5032 Chronic diastolic (congestive) heart failure: Secondary | ICD-10-CM

## 2021-07-12 DIAGNOSIS — I495 Sick sinus syndrome: Secondary | ICD-10-CM

## 2021-07-14 LAB — CUP PACEART REMOTE DEVICE CHECK
Battery Remaining Longevity: 147 mo
Battery Voltage: 3.17 V
Brady Statistic AP VP Percent: 0.73 %
Brady Statistic AP VS Percent: 79.8 %
Brady Statistic AS VP Percent: 0.49 %
Brady Statistic AS VS Percent: 18.98 %
Brady Statistic RA Percent Paced: 82.97 %
Brady Statistic RV Percent Paced: 1.22 %
Date Time Interrogation Session: 20230510151036
Implantable Lead Implant Date: 20230130
Implantable Lead Implant Date: 20230130
Implantable Lead Location: 753859
Implantable Lead Location: 753860
Implantable Lead Model: 3830
Implantable Lead Model: 5076
Implantable Pulse Generator Implant Date: 20230130
Lead Channel Impedance Value: 304 Ohm
Lead Channel Impedance Value: 399 Ohm
Lead Channel Impedance Value: 418 Ohm
Lead Channel Impedance Value: 570 Ohm
Lead Channel Pacing Threshold Amplitude: 0.625 V
Lead Channel Pacing Threshold Amplitude: 0.75 V
Lead Channel Pacing Threshold Pulse Width: 0.4 ms
Lead Channel Pacing Threshold Pulse Width: 0.4 ms
Lead Channel Sensing Intrinsic Amplitude: 1.875 mV
Lead Channel Sensing Intrinsic Amplitude: 1.875 mV
Lead Channel Sensing Intrinsic Amplitude: 2.25 mV
Lead Channel Sensing Intrinsic Amplitude: 2.25 mV
Lead Channel Setting Pacing Amplitude: 2.25 V
Lead Channel Setting Pacing Amplitude: 2.25 V
Lead Channel Setting Pacing Pulse Width: 0.4 ms
Lead Channel Setting Sensing Sensitivity: 0.9 mV

## 2021-07-15 ENCOUNTER — Telehealth: Payer: Self-pay | Admitting: Internal Medicine

## 2021-07-15 NOTE — Telephone Encounter (Signed)
Copied from [place] [id number]. Topic: Medicare AWV ?>> Jul 15, 2021  9:24 AM Harris-Coley, Hannah Beat wrote: ?Reason for CRM: Left message for patient to schedule Annual Wellness Visit.  Please schedule with Nurse Health Advisor Denisa O'Brien-Blaney, LPN at [facility].  Please call [phone] ask for Juliann Pulse ?

## 2021-07-17 ENCOUNTER — Other Ambulatory Visit: Payer: Self-pay | Admitting: Internal Medicine

## 2021-07-19 ENCOUNTER — Encounter: Payer: Medicare HMO | Admitting: Internal Medicine

## 2021-07-20 ENCOUNTER — Inpatient Hospital Stay: Payer: Medicare HMO | Admitting: Internal Medicine

## 2021-07-20 ENCOUNTER — Encounter: Payer: Self-pay | Admitting: Internal Medicine

## 2021-07-20 ENCOUNTER — Inpatient Hospital Stay: Payer: Medicare HMO

## 2021-07-20 ENCOUNTER — Inpatient Hospital Stay: Payer: Medicare HMO | Attending: Internal Medicine

## 2021-07-20 DIAGNOSIS — C7951 Secondary malignant neoplasm of bone: Secondary | ICD-10-CM | POA: Diagnosis not present

## 2021-07-20 DIAGNOSIS — Z5111 Encounter for antineoplastic chemotherapy: Secondary | ICD-10-CM | POA: Diagnosis present

## 2021-07-20 DIAGNOSIS — Z79899 Other long term (current) drug therapy: Secondary | ICD-10-CM | POA: Insufficient documentation

## 2021-07-20 DIAGNOSIS — C61 Malignant neoplasm of prostate: Secondary | ICD-10-CM | POA: Insufficient documentation

## 2021-07-20 LAB — CBC WITH DIFFERENTIAL/PLATELET
Abs Immature Granulocytes: 0.01 10*3/uL (ref 0.00–0.07)
Basophils Absolute: 0 10*3/uL (ref 0.0–0.1)
Basophils Relative: 1 %
Eosinophils Absolute: 0.2 10*3/uL (ref 0.0–0.5)
Eosinophils Relative: 5 %
HCT: 37.7 % — ABNORMAL LOW (ref 39.0–52.0)
Hemoglobin: 13 g/dL (ref 13.0–17.0)
Immature Granulocytes: 0 %
Lymphocytes Relative: 32 %
Lymphs Abs: 1.7 10*3/uL (ref 0.7–4.0)
MCH: 30.2 pg (ref 26.0–34.0)
MCHC: 34.5 g/dL (ref 30.0–36.0)
MCV: 87.5 fL (ref 80.0–100.0)
Monocytes Absolute: 0.6 10*3/uL (ref 0.1–1.0)
Monocytes Relative: 11 %
Neutro Abs: 2.8 10*3/uL (ref 1.7–7.7)
Neutrophils Relative %: 51 %
Platelets: 184 10*3/uL (ref 150–400)
RBC: 4.31 MIL/uL (ref 4.22–5.81)
RDW: 12.9 % (ref 11.5–15.5)
WBC: 5.3 10*3/uL (ref 4.0–10.5)
nRBC: 0 % (ref 0.0–0.2)

## 2021-07-20 LAB — COMPREHENSIVE METABOLIC PANEL
ALT: 17 U/L (ref 0–44)
AST: 22 U/L (ref 15–41)
Albumin: 3.8 g/dL (ref 3.5–5.0)
Alkaline Phosphatase: 53 U/L (ref 38–126)
Anion gap: 8 (ref 5–15)
BUN: 28 mg/dL — ABNORMAL HIGH (ref 8–23)
CO2: 24 mmol/L (ref 22–32)
Calcium: 9.3 mg/dL (ref 8.9–10.3)
Chloride: 107 mmol/L (ref 98–111)
Creatinine, Ser: 1.5 mg/dL — ABNORMAL HIGH (ref 0.61–1.24)
GFR, Estimated: 46 mL/min — ABNORMAL LOW (ref >60)
Glucose, Bld: 104 mg/dL — ABNORMAL HIGH (ref 70–99)
Potassium: 4.3 mmol/L (ref 3.5–5.1)
Sodium: 139 mmol/L (ref 135–145)
Total Bilirubin: 0.5 mg/dL (ref 0.3–1.2)
Total Protein: 6.8 g/dL (ref 6.5–8.1)

## 2021-07-20 LAB — PSA: Prostatic Specific Antigen: 0.01 ng/mL (ref 0.00–4.00)

## 2021-07-20 MED ORDER — LEUPROLIDE ACETATE (4 MONTH) 30 MG ~~LOC~~ KIT
30.0000 mg | PACK | Freq: Once | SUBCUTANEOUS | Status: AC
  Administered 2021-07-20: 30 mg via SUBCUTANEOUS
  Filled 2021-07-20: qty 30

## 2021-07-20 NOTE — Assessment & Plan Note (Addendum)
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 Stage IV-on Eligard; Bone scan October 2020-improved;  JAN 2023- PSA- <0.1. Proceed with eligard q4M today.  Discussed regarding intermittent ADT; however since patient not having too many side effects proceed with ADT. ? ?# Iron deficient anemia/CKD- III-[s/p EGD March 2021]--status post IV iron infusion x1 [FEB 2022]-today hemoglobin is 13.0  STABLE.  Hold off on infusion.  Continue oral iron. ? ?#CKD stage III-GFR 49- .  Clinically stable. ? ?# A. fib paroxysmal-sinus rhythm; dizzy spells- a.fib-s/p ppm aspirin Plavix- STABLE.  ? ?# DISPOSITION:  ?# Eligard today; HOLD  Venofer ?# Follow up in 4 months; MD;labs- cbc.cmp/PSA eligard; possible venofer- Dr.B ? ?Cc; Dr.Scott/ ? ?

## 2021-07-20 NOTE — Progress Notes (Signed)
Patient has a pacemaker since last visit and doing much better. ?

## 2021-07-20 NOTE — Progress Notes (Signed)
[place] ?OFFICE PROGRESS NOTE ? ?Patient Care Team: ?Einar Pheasant, MD as PCP - General (Internal Medicine) ?[name], MD as PCP - Cardiology (Cardiology) ?[name], MD as Consulting Physician (Cardiology) ?Cammie Sickle, MD as Consulting Physician (Hematology and Oncology) ? ? Cancer Staging  ?No matching staging information was found for the patient. ? ? ?Oncology History Overview Note  ?Nov 2017- completed IM RT radiation therapy to his prostate and pelvic nodes for Gleason 7 (4+3) adenocarcinoma the prostate presenting the PSA of 7.8. ? ?# JAN 2019- METASTATIC PROSTATE CA to Bone [low volume met on bone scan; CT- NED]; PSA ~50; March 25th 2019- Lupron 45 mg IM [Urology q 45M]; June 26, 2018-Eligard every 3 months[intolerance to Lupron/question A. Fib/injection site pain ? ?# May 23rd Zytiga 229m/day; no prednisone; STOPPED MARCH 17th 2021-repeated severe hypokalemia; Eligard ? ?# Barretts- EGD/ colo [March 2021; Dr.Byrnett].  ? ?# OBenita Stabile [place]]; Oct 2019-paroxysmal A.fib/not on eliquis; [Dr.Khan] -Dr.Arida ; Dizzy spells [Dr.Shah] s/p PPM [2023] ? ?------------------------------------------------------------------ ? ?DIAGNOSIS: [ JAN 2019]- Met- PROSTATE CANCER ? ?STAGE:  IV       ;GOALS: PALLIATIVE ? ? ?  ?Prostate cancer ([place]  ? ? ?INTERVAL HISTORY: Patient is with his wife.  He is walking independently. ? ?JMARSHELL DILAURO89y.o.  male pleasant patient above history of metastatic prostate cancer on ADT is here for follow-up. ? ?Patient's dizzy spells- improved s/p pacemaker placement.  He is able to walk 2 to 3 miles every day. ? ?Otherwise no worsening hot flashes or joint pains. Patient has gained weight.  No difficulty with urination. ? ?Review of Systems  ?Constitutional:  Negative for chills, diaphoresis, fever, malaise/fatigue and weight loss.  ?HENT:  Negative for nosebleeds and sore throat.   ?Eyes:  Negative for double vision.  ?Respiratory:   Negative for cough, hemoptysis, sputum production, shortness of breath and wheezing.   ?Cardiovascular:  Negative for chest pain, palpitations, orthopnea and leg swelling.  ?Gastrointestinal:  Negative for abdominal pain, blood in stool, constipation, diarrhea, heartburn, melena, nausea and vomiting.  ?Genitourinary:  Negative for dysuria, frequency and urgency.  ?Musculoskeletal:  Positive for myalgias. Negative for back pain and joint pain.  ?Skin: Negative.  Negative for itching and rash.  ?Neurological:  Negative for tingling, focal weakness, weakness and headaches.  ?Endo/Heme/Allergies:  Does not bruise/bleed easily.  ?Psychiatric/Behavioral:  Negative for depression. The patient is not nervous/anxious and does not have insomnia.   ? ?PAST MEDICAL HISTORY :  ?Past Medical History:  ?Diagnosis Date  ? (HFpEF) heart failure with preserved ejection fraction ([place]   ? a. 05/2018 Echo: Nl EF, Gr1 DD, sev dil LA, mild MR/TR, mild PAH; b. 05/2019 Echo: EF 55-60%, no rwma, mild LVH. Nl PASP. Mildly dil LA. Triv MR. Mild AoV sclerosis w/o stenosis. Mildly dil Asc AO (336m.  ? Allergic rhinitis   ? Barrett's esophagus 10/21/2013  ? Bone cancer ([place]  ? CKD (chronic kidney disease), stage III ([place]  ? Colon polyp, hyperplastic   ? COPD (chronic obstructive pulmonary disease) ([place]  ? mild COPD. former smoker  ? Coronary artery disease   ? a. 2003 s/p PCI ([facility]); b. 2004 s/p PCI x 2 ([facility]); c. 11/2017 Cath: LM nl, LAD mild dzs, LCX patent stent w/ 30% distal edge restenosis, RCA patent distal stent w/ 30% prox edge stenosis. Nl EF->Med Rx;  ? Diverticulosis   ? Fundic gland polyps of stomach, benign   ?  Gastritis   ? GERD (gastroesophageal reflux disease)   ? Hypercholesteremia   ? Hypertension   ? Prostate cancer ([place])   ? Prostatism   ? Reflux esophagitis   ? Renal stones   ? Skin cancer   ? left cheek/lesion excised  ? Sleep apnea   ? Tachycardia   ? a. 11/2017 admit w/ tachycardia/? afib-->no afib noted upon review  (amio/OAC d/c'd).  ? TIA (transient ischemic attack)   ? ? ?PAST SURGICAL HISTORY :   ?Past Surgical History:  ?Procedure Laterality Date  ? CARDIAC CATHETERIZATION    ? COLONOSCOPY WITH PROPOFOL N/A 09/13/2015  ? Procedure: COLONOSCOPY WITH PROPOFOL;  Surgeon: Lollie Sails, MD;  Location: [facility] ENDOSCOPY;  Service: Endoscopy;  Laterality: N/A;  ? COLONOSCOPY WITH PROPOFOL N/A 05/07/2019  ? Procedure: COLONOSCOPY WITH PROPOFOL;  Surgeon: Robert Bellow, MD;  Location: [facility] ENDOSCOPY;  Service: Endoscopy;  Laterality: N/A;  ? CORONARY ANGIOPLASTY    ? ESOPHAGOGASTRODUODENOSCOPY (EGD) WITH PROPOFOL N/A 09/13/2015  ? Procedure: ESOPHAGOGASTRODUODENOSCOPY (EGD) WITH PROPOFOL;  Surgeon: Lollie Sails, MD;  Location: [facility] ENDOSCOPY;  Service: Endoscopy;  Laterality: N/A;  ? ESOPHAGOGASTRODUODENOSCOPY (EGD) WITH PROPOFOL N/A 12/28/2015  ? Procedure: ESOPHAGOGASTRODUODENOSCOPY (EGD) WITH PROPOFOL;  Surgeon: Lollie Sails, MD;  Location: [facility] ENDOSCOPY;  Service: Endoscopy;  Laterality: N/A;  ? ESOPHAGOGASTRODUODENOSCOPY (EGD) WITH PROPOFOL N/A 06/16/2016  ? Procedure: ESOPHAGOGASTRODUODENOSCOPY (EGD) WITH PROPOFOL;  Surgeon: Lollie Sails, MD;  Location[facility] ENDOSCOPY;  Service: Endoscopy;  Laterality: N/A;  ? ESOPHAGOGASTRODUODENOSCOPY (EGD) WITH PROPOFOL N/A 05/07/2019  ? Procedure: ESOPHAGOGASTRODUODENOSCOPY (EGD) WITH PROPOFOL;  Surgeon: Robert Bellow, MD;  Location: [facility] ENDOSCOPY;  Service: Endoscopy;  Laterality: N/A;  ? EYE SURGERY  2013  ? CATARACT EXTRACTION  ? GANGLION CYST EXCISION Right 05/18/2017  ? Procedure: REMOVAL GANGLION CYST ANKLE;  Surgeon: Samara Deist, DPM;  Location: [facility] ORS;  Service: Podiatry;  Laterality: Right;  ? heart cath stent    ? LEFT HEART CATH AND CORONARY ANGIOGRAPHY Right 12/03/2017  ? Procedure: Left Heart Cath and Coronary Angiography with possible coronary intervention;  Surgeon: Dionisio David, MD;  Location: [place] CV LAB;  Service: Cardiovascular;   Laterality: Right;  ? PACEMAKER IMPLANT N/A 04/04/2021  ? Procedure: PACEMAKER IMPLANT;  Surgeon: Deboraha Sprang, MD;  Location: [place] CV LAB;  Service: Cardiovascular;  Laterality: N/A;  ? PROSTATE BIOPSY    ? RIGHT/LEFT HEART CATH AND CORONARY ANGIOGRAPHY N/A 02/07/2021  ? Procedure: RIGHT/LEFT HEART CATH AND CORONARY ANGIOGRAPHY;  Surgeon: [name], MD;  Location: [place] CV LAB;  Service: Cardiovascular;  Laterality: N/A;  ? stents    ? multiple  ? ? ?FAMILY HISTORY :   ?Family History  ?Problem Relation Age of Onset  ? Cancer Mother   ?     gastric and lung  ? Cancer Father   ?     multiple myeloma  ? Stroke Father   ? Cancer Sister   ?     leukemia  ? Cancer Brother   ?     leukemia  ? Cancer Brother   ?     kidney  ? Cancer Daughter   ?     Uterine  ? Cancer Other   ?     [place] Son): Prostate  ? ? ?SOCIAL HISTORY:   ?Social History  ? ?Tobacco Use  ? Smoking status: Former  ?  Packs/day: 1.00  ?  Years: 35.00  ?  Pack years:  35.00  ?  Types: Cigarettes  ?  Quit date: 03/06/1986  ?  Years since quitting: 35.3  ? Smokeless tobacco: Former  ?  Types: Chew  ?Vaping Use  ? Vaping Use: Never used  ?Substance Use Topics  ? Alcohol use: No  ? Drug use: No  ? ? ?ALLERGIES:  is allergic to atorvastatin. ? ?MEDICATIONS:  ?Current Outpatient Medications  ?Medication Sig Dispense Refill  ? amLODipine (NORVASC) 2.5 MG tablet Take 1 tablet (2.5 mg total) by mouth daily. (Patient taking differently: Take 2.5 mg by mouth daily. Taking 1/2 tab as advised by cardiologist.) 90 tablet 2  ? aspirin EC 81 MG tablet Take 81 mg by mouth daily.    ? Calcium Carb-Cholecalciferol (CALCIUM + D3 PO) Take 1 tablet by mouth daily.    ? cholecalciferol (VITAMIN D) 1000 units tablet Take 1,000 Units by mouth daily.     ? clopidogrel (PLAVIX) 75 MG tablet TAKE 1 TABLET BY MOUTH ONCE DAILY 90 tablet 0  ? ferrous sulfate 324 MG TBEC Take 324 mg by mouth daily.    ? fexofenadine (ALLEGRA) 180 MG tablet Take 180 mg by  mouth daily as needed for allergies or rhinitis.     ? furosemide (LASIX) 20 MG tablet Take 1 tablet (20 mg total) by mouth daily. 30 tablet 4  ? isosorbide mononitrate (IMDUR) 30 MG 24 hr tablet TAKE 1 TAB

## 2021-07-20 NOTE — Progress Notes (Signed)
Per MD hold Venofer today 

## 2021-07-25 NOTE — Progress Notes (Signed)
Remote pacemaker transmission.   

## 2021-07-26 ENCOUNTER — Encounter: Payer: Self-pay | Admitting: Internal Medicine

## 2021-07-26 ENCOUNTER — Ambulatory Visit (INDEPENDENT_AMBULATORY_CARE_PROVIDER_SITE_OTHER): Payer: Medicare HMO | Admitting: Internal Medicine

## 2021-07-26 VITALS — BP 130/70 | Ht 70.0 in | Wt 219.0 lb

## 2021-07-26 DIAGNOSIS — Z950 Presence of cardiac pacemaker: Secondary | ICD-10-CM

## 2021-07-26 DIAGNOSIS — I498 Other specified cardiac arrhythmias: Secondary | ICD-10-CM

## 2021-07-26 DIAGNOSIS — I495 Sick sinus syndrome: Secondary | ICD-10-CM | POA: Diagnosis not present

## 2021-07-26 DIAGNOSIS — I493 Ventricular premature depolarization: Secondary | ICD-10-CM

## 2021-07-26 DIAGNOSIS — R001 Bradycardia, unspecified: Secondary | ICD-10-CM

## 2021-07-26 NOTE — Patient Instructions (Signed)
Medication Instructions:  - Your physician recommends that you continue on your current medications as directed. Please refer to the Current Medication list given to you today.  *If you need a refill on your cardiac medications before your next appointment, please call your pharmacy*   Lab Work: - none ordered  If you have labs (blood work) drawn today and your tests are completely normal, you will receive your results only by: MyChart Message (if you have MyChart) OR A paper copy in the mail If you have any lab test that is abnormal or we need to change your treatment, we will call you to review the results.   Testing/Procedures: - none ordered   Follow-Up: At [facility], you and your health needs are our priority.  As part of our continuing mission to provide you with exceptional heart care, we have created designated Provider Care Teams.  These Care Teams include your primary Cardiologist (physician) and Advanced Practice Providers (APPs -  Physician Assistants and Nurse Practitioners) who all work together to provide you with the care you need, when you need it.  We recommend signing up for the patient portal called "MyChart".  Sign up information is provided on this After Visit Summary.  MyChart is used to connect with patients for Virtual Visits (Telemedicine).  Patients are able to view lab/test results, encounter notes, upcoming appointments, etc.  Non-urgent messages can be sent to your provider as well.   To learn more about what you can do with MyChart, go to [email or web address].    Your next appointment:   9 month(s)  The format for your next appointment:   In Person  Provider:   Steven Klein, MD    Other Instructions N/a  Important Information About Sugar       

## 2021-07-26 NOTE — Progress Notes (Signed)
Patient Care Team: Einar Pheasant, MD as PCP - General (Internal Medicine) [name], MD as PCP - Cardiology (Cardiology) [name], MD as Consulting Physician (Cardiology) Cammie Sickle, MD as Consulting Physician (Hematology and Oncology)   HPI  Nathaniel Thompson is a 86 y.o. male seen in follow-up for episodic dizziness associated with dyspnea with remote history of syncope.  Event recorder suggested a correlation with junctional rhythm. With t history of sinus bradycardia he underwent pacing with anticipation of antiarrhythmic suppression of his junctional rhythm  He is much improved.  Rare dizziness and breathing much better since pacing with some DOE    He has a history of coronary artery disease with prior PCI 2003 and 2004 when stents were patent 2022 History of TIA.   DATE TEST EF    9/19 LHC   No obstructive disease  3/21 Echo   55-60 %    12/22 LHC   LCXst and RCAst patent No obstructive CAD    Date Cr K Hgb LDL  1/23 1.41 4.0 12.5 62 (3/23)              Records and Results Reviewed  Past Medical History:  Diagnosis Date   (HFpEF) heart failure with preserved ejection fraction ([place])    a. 05/2018 Echo: Nl EF, Gr1 DD, sev dil LA, mild MR/TR, mild PAH; b. 05/2019 Echo: EF 55-60%, no rwma, mild LVH. Nl PASP. Mildly dil LA. Triv MR. Mild AoV sclerosis w/o stenosis. Mildly dil Asc AO (87m).   Allergic rhinitis    Barrett's esophagus 10/21/2013   Bone cancer (HCC)    CKD (chronic kidney disease), stage III (HCC)    Colon polyp, hyperplastic    COPD (chronic obstructive pulmonary disease) (HCC)    mild COPD. former smoker   Coronary artery disease    a. 2003 s/p PCI ([facility]); b. 2004 s/p PCI x 2 ([facility]); c. 11/2017 Cath: LM nl, LAD mild dzs, LCX patent stent w/ 30% distal edge restenosis, RCA patent distal stent w/ 30% prox edge stenosis. Nl EF->Med Rx;   Diverticulosis    Fundic gland polyps of stomach, benign    Gastritis    GERD  (gastroesophageal reflux disease)    Hypercholesteremia    Hypertension    Prostate cancer ([place]    Prostatism    Reflux esophagitis    Renal stones    Skin cancer    left cheek/lesion excised   Sleep apnea    Tachycardia    a. 11/2017 admit w/ tachycardia/? afib-->no afib noted upon review (amio/OAC d/c'd).   TIA (transient ischemic attack)     Past Surgical History:  Procedure Laterality Date   CARDIAC CATHETERIZATION     COLONOSCOPY WITH PROPOFOL N/A 09/13/2015   Procedure: COLONOSCOPY WITH PROPOFOL;  Surgeon: MLollie Sails MD;  [facility]  Service: Endoscopy;  Laterality: N/A;   COLONOSCOPY WITH PROPOFOL N/A 05/07/2019   Procedure: COLONOSCOPY WITH PROPOFOL;  Surgeon: BRobert Bellow MD;  Location: [facility] ENDOSCOPY;  Service: Endoscopy;  Laterality: N/A;   CORONARY ANGIOPLASTY     ESOPHAGOGASTRODUODENOSCOPY (EGD) WITH PROPOFOL N/A 09/13/2015   Procedure: ESOPHAGOGASTRODUODENOSCOPY (EGD) WITH PROPOFOL;  Surgeon: MLollie Sails MD;  [facility]  Service: Endoscopy;  Laterality: N/A;   ESOPHAGOGASTRODUODENOSCOPY (EGD) WITH PROPOFOL N/A 12/28/2015   Procedure: ESOPHAGOGASTRODUODENOSCOPY (EGD) WITH PROPOFOL;  Surgeon: MLollie Sails MD;  [facility];  Service: Endoscopy;  Laterality: N/A;   ESOPHAGOGASTRODUODENOSCOPY (EGD)  WITH PROPOFOL N/A 06/16/2016   Procedure: ESOPHAGOGASTRODUODENOSCOPY (EGD) WITH PROPOFOL;  Surgeon: Lollie Sails, MD;  Location: [facility] ENDOSCOPY;  Service: Endoscopy;  Laterality: N/A;   ESOPHAGOGASTRODUODENOSCOPY (EGD) WITH PROPOFOL N/A 05/07/2019   Procedure: ESOPHAGOGASTRODUODENOSCOPY (EGD) WITH PROPOFOL;  Surgeon: Robert Bellow, MD;  Location: [facility] ENDOSCOPY;  Service: Endoscopy;  Laterality: N/A;   EYE SURGERY  2013   CATARACT EXTRACTION   GANGLION CYST EXCISION Right 05/18/2017   Procedure: REMOVAL GANGLION CYST ANKLE;  Surgeon: Samara Deist, DPM;  Location: [facility] ORS;  Service: Podiatry;  Laterality:  Right;   heart cath stent     LEFT HEART CATH AND CORONARY ANGIOGRAPHY Right 12/03/2017   Procedure: Left Heart Cath and Coronary Angiography with possible coronary intervention;  Surgeon: Dionisio David, MD;  Location: [place] CV LAB;  Service: Cardiovascular;  Laterality: Right;   PACEMAKER IMPLANT N/A 04/04/2021   Procedure: PACEMAKER IMPLANT;  Surgeon: Deboraha Sprang, MD;  Location: [place] CV LAB;  Service: Cardiovascular;  Laterality: N/A;   PROSTATE BIOPSY     RIGHT/LEFT HEART CATH AND CORONARY ANGIOGRAPHY N/A 02/07/2021   Procedure: RIGHT/LEFT HEART CATH AND CORONARY ANGIOGRAPHY;  Surgeon: [name], MD;  Location: [place] CV LAB;  Service: Cardiovascular;  Laterality: N/A;   stents     multiple    Current Meds  Medication Sig   amLODipine (NORVASC) 2.5 MG tablet Take 1 tablet (2.5 mg total) by mouth daily.   aspirin EC 81 MG tablet Take 81 mg by mouth daily.   Calcium Carb-Cholecalciferol (CALCIUM + D3 PO) Take 1 tablet by mouth daily.   cholecalciferol (VITAMIN D) 1000 units tablet Take 1,000 Units by mouth daily.    clopidogrel (PLAVIX) 75 MG tablet TAKE 1 TABLET BY MOUTH ONCE DAILY   ferrous sulfate 324 MG TBEC Take 324 mg by mouth daily.   furosemide (LASIX) 20 MG tablet Take 1 tablet (20 mg total) by mouth daily.   isosorbide mononitrate (IMDUR) 30 MG 24 hr tablet TAKE 1 TABLET BY MOUTH ONCE DAILY   Leuprolide Acetate, 3 Month, (ELIGARD) 22.5 MG injection Inject 22.5 mg into the skin every 4 (four) months.   losartan (COZAAR) 50 MG tablet Take 1 tablet (50 mg total) by mouth daily.   lovastatin (MEVACOR) 40 MG tablet TAKE 2 TABLETS BY MOUTH AT BEDTIME   Multiple Vitamin (MULTI-VITAMINS) TABS Take 1 tablet by mouth daily.    pantoprazole (PROTONIX) 40 MG tablet Take 40 mg by mouth 2 (two) times daily.     Allergies  Allergen Reactions   Atorvastatin Other (See Comments)    Achy joints      Review of Systems negative except from HPI and  PMH  Physical Exam BP 130/70 (BP Location: Left Arm, Patient Position: Sitting, Cuff Size: Large)   Ht '5\' 10"'$  (1.778 m)   Wt 219 lb (99.3 kg)   SpO2 96%   BMI 31.42 kg/m  Well developed and well nourished in no acute distress HENT normal E scleral and icterus clear Neck Supple JVP flat; carotids brisk and full Clear to ausculation Device pocket well healed; without hematoma or erythema.  There is no tethering  Regular rate and rhythm, no murmurs gallops or rub Soft with active bowel sounds No clubbing cyanosis  Edema Alert and oriented, grossly normal motor and sensory function Skin Warm and Dry  ECG atrial pacing @ 75   Estimated Creatinine Clearance: 44.1 mL/min (A) (by C-G formula based on SCr of 1.5 mg/dL (  H)).  The patient's device was interrogated and the information was fully reviewed.  The device was reprogrammed to activate rate response and reprogram outputs to chronic settings Assessment and  Plan Pacemaker-[employer]   Dizziness often but not always associated with dyspnea-sometimes positional sometimes not   Ischemic heart disease with prior PCI and in no obstructive lesions 12/22; normal LV function  Sinus bradycardia/chronotropic incompetence   Junctional rhythm--accelerated symptomatic    PVCs   Ventricular tachycardia-nonsustained   Ectopic low atrial rhythm   Hypertension  Significantly improved with scant dizzy spells, breathing is much improved although with exertion still limited; his device was programmed in the AAI mode at 75 for junctional rhythm suppression which seems to have largely accomplished.  We will add rate response.  Blood pressures are reasonably controlled.  We will continue him on Cozaar 50 and amlodipine 2.5.  Euvolemic.  Continue him on his furosemide 20 daily.  No symptoms of angina.  Continue him on his aspirin 81, clopidogrel 75, amlodipine Imdur 30.  Current medicines are reviewed at length with the patient today .  The  patient does not  have concerns regarding medicines.

## 2021-08-05 ENCOUNTER — Other Ambulatory Visit: Payer: Self-pay | Admitting: Cardiovascular Disease

## 2021-09-21 ENCOUNTER — Other Ambulatory Visit (INDEPENDENT_AMBULATORY_CARE_PROVIDER_SITE_OTHER): Payer: Medicare HMO

## 2021-09-21 DIAGNOSIS — E7800 Pure hypercholesterolemia, unspecified: Secondary | ICD-10-CM

## 2021-09-21 DIAGNOSIS — I10 Essential (primary) hypertension: Secondary | ICD-10-CM

## 2021-09-21 DIAGNOSIS — R739 Hyperglycemia, unspecified: Secondary | ICD-10-CM

## 2021-09-21 LAB — BASIC METABOLIC PANEL
BUN: 19 mg/dL (ref 6–23)
CO2: 24 mEq/L (ref 19–32)
Calcium: 9.2 mg/dL (ref 8.4–10.5)
Chloride: 106 mEq/L (ref 96–112)
Creatinine, Ser: 1.19 mg/dL (ref 0.40–1.50)
GFR: 56.37 mL/min — ABNORMAL LOW (ref >60.00)
Glucose, Bld: 104 mg/dL — ABNORMAL HIGH (ref 70–99)
Potassium: 3.9 mEq/L (ref 3.5–5.1)
Sodium: 139 mEq/L (ref 135–145)

## 2021-09-21 LAB — LIPID PANEL
Cholesterol: 117 mg/dL (ref 0–200)
HDL: 32.7 mg/dL — ABNORMAL LOW (ref >39.00)
LDL Cholesterol: 64 mg/dL (ref 0–99)
NonHDL: 84.23
Total CHOL/HDL Ratio: 4
Triglycerides: 99 mg/dL (ref 0.0–149.0)
VLDL: 19.8 mg/dL (ref 0.0–40.0)

## 2021-09-21 LAB — HEPATIC FUNCTION PANEL
ALT: 12 U/L (ref 0–53)
AST: 18 U/L (ref 0–37)
Albumin: 3.9 g/dL (ref 3.5–5.2)
Alkaline Phosphatase: 53 U/L (ref 39–117)
Bilirubin, Direct: 0.1 mg/dL (ref 0.0–0.3)
Total Bilirubin: 0.6 mg/dL (ref 0.2–1.2)
Total Protein: 6.4 g/dL (ref 6.0–8.3)

## 2021-09-21 LAB — HEMOGLOBIN A1C: Hgb A1c MFr Bld: 5.9 % (ref 4.6–6.5)

## 2021-09-21 LAB — TSH: TSH: 2.19 u[IU]/mL (ref 0.35–5.50)

## 2021-09-26 ENCOUNTER — Encounter: Payer: Self-pay | Admitting: Internal Medicine

## 2021-09-26 ENCOUNTER — Ambulatory Visit (INDEPENDENT_AMBULATORY_CARE_PROVIDER_SITE_OTHER): Payer: Medicare HMO | Admitting: Internal Medicine

## 2021-09-26 VITALS — BP 132/68 | HR 60 | Temp 98.7°F | Resp 17 | Ht 71.0 in | Wt 220.4 lb

## 2021-09-26 DIAGNOSIS — D649 Anemia, unspecified: Secondary | ICD-10-CM

## 2021-09-26 DIAGNOSIS — I25119 Atherosclerotic heart disease of native coronary artery with unspecified angina pectoris: Secondary | ICD-10-CM

## 2021-09-26 DIAGNOSIS — N1831 Chronic kidney disease, stage 3a: Secondary | ICD-10-CM

## 2021-09-26 DIAGNOSIS — I714 Abdominal aortic aneurysm, without rupture, unspecified: Secondary | ICD-10-CM

## 2021-09-26 DIAGNOSIS — I10 Essential (primary) hypertension: Secondary | ICD-10-CM

## 2021-09-26 DIAGNOSIS — I724 Aneurysm of artery of lower extremity: Secondary | ICD-10-CM

## 2021-09-26 DIAGNOSIS — E7800 Pure hypercholesterolemia, unspecified: Secondary | ICD-10-CM

## 2021-09-26 DIAGNOSIS — I6523 Occlusion and stenosis of bilateral carotid arteries: Secondary | ICD-10-CM

## 2021-09-26 DIAGNOSIS — I5032 Chronic diastolic (congestive) heart failure: Secondary | ICD-10-CM

## 2021-09-26 DIAGNOSIS — R3915 Urgency of urination: Secondary | ICD-10-CM

## 2021-09-26 DIAGNOSIS — Z0000 Encounter for general adult medical examination without abnormal findings: Secondary | ICD-10-CM

## 2021-09-26 DIAGNOSIS — J449 Chronic obstructive pulmonary disease, unspecified: Secondary | ICD-10-CM

## 2021-09-26 DIAGNOSIS — G4730 Sleep apnea, unspecified: Secondary | ICD-10-CM

## 2021-09-26 DIAGNOSIS — K2270 Barrett's esophagus without dysplasia: Secondary | ICD-10-CM

## 2021-09-26 DIAGNOSIS — D500 Iron deficiency anemia secondary to blood loss (chronic): Secondary | ICD-10-CM

## 2021-09-26 DIAGNOSIS — R739 Hyperglycemia, unspecified: Secondary | ICD-10-CM

## 2021-09-26 DIAGNOSIS — R42 Dizziness and giddiness: Secondary | ICD-10-CM

## 2021-09-26 NOTE — Progress Notes (Unsigned)
Patient ID: Nathaniel Thompson, male   DOB: Jun 11, 1935, 86 y.o.   MRN: [id number]   Subjective:    Patient ID: Nathaniel Thompson, male    DOB: 07/09/1935, 86 y.o.   MRN: [id number]  This visit occurred during the SARS-CoV-2 public health emergency.  Safety protocols were in place, including screening questions prior to the visit, additional usage of staff PPE, and extensive cleaning of exam room while observing appropriate contact time as indicated for disinfecting solutions.   Patient here for physical exam.   Chief Complaint  Patient presents with   Follow-up    Yearly CPE   .   HPI    Past Medical History:  Diagnosis Date   (HFpEF) heart failure with preserved ejection fraction ([place])    a. 05/2018 Echo: Nl EF, Gr1 DD, sev dil LA, mild MR/TR, mild PAH; b. 05/2019 Echo: EF 55-60%, no rwma, mild LVH. Nl PASP. Mildly dil LA. Triv MR. Mild AoV sclerosis w/o stenosis. Mildly dil Asc AO (55mm).   Allergic rhinitis    Barrett's esophagus 10/21/2013   Bone cancer (HCC)    CKD (chronic kidney disease), stage III (HCC)    Colon polyp, hyperplastic    COPD (chronic obstructive pulmonary disease) (HCC)    mild COPD. former smoker   Coronary artery disease    a. 2003 s/p PCI ([facility]); b. 2004 s/p PCI x 2 ([facility]); c. 11/2017 Cath: LM nl, LAD mild dzs, LCX patent stent w/ 30% distal edge restenosis, RCA patent distal stent w/ 30% prox edge stenosis. Nl EF->Med Rx;   Diverticulosis    Fundic gland polyps of stomach, benign    Gastritis    GERD (gastroesophageal reflux disease)    Hypercholesteremia    Hypertension    Prostate cancer ([place])    Prostatism    Reflux esophagitis    Renal stones    Skin cancer    left cheek/lesion excised   Sleep apnea    Tachycardia    a. 11/2017 admit w/ tachycardia/? afib-->no afib noted upon review (amio/OAC d/c'd).   TIA (transient ischemic attack)    Past Surgical History:  Procedure Laterality Date   CARDIAC CATHETERIZATION     COLONOSCOPY WITH PROPOFOL N/A  09/13/2015   Procedure: COLONOSCOPY WITH PROPOFOL;  Surgeon: Lollie Sails, MD;  Location: [facility] ENDOSCOPY;  Service: Endoscopy;  Laterality: N/A;   COLONOSCOPY WITH PROPOFOL N/A 05/07/2019   Procedure: COLONOSCOPY WITH PROPOFOL;  Surgeon: Robert Bellow, MD;  Location: [facility] ENDOSCOPY;  Service: Endoscopy;  Laterality: N/A;   CORONARY ANGIOPLASTY     ESOPHAGOGASTRODUODENOSCOPY (EGD) WITH PROPOFOL N/A 09/13/2015   Procedure: ESOPHAGOGASTRODUODENOSCOPY (EGD) WITH PROPOFOL;  Surgeon: Lollie Sails, MD;  Location: [facility] ENDOSCOPY;  Service: Endoscopy;  Laterality: N/A;   ESOPHAGOGASTRODUODENOSCOPY (EGD) WITH PROPOFOL N/A 12/28/2015   Procedure: ESOPHAGOGASTRODUODENOSCOPY (EGD) WITH PROPOFOL;  Surgeon: Lollie Sails, MD;  Location: [facility] ENDOSCOPY;  Service: Endoscopy;  Laterality: N/A;   ESOPHAGOGASTRODUODENOSCOPY (EGD) WITH PROPOFOL N/A 06/16/2016   Procedure: ESOPHAGOGASTRODUODENOSCOPY (EGD) WITH PROPOFOL;  Surgeon: Lollie Sails, MD;  Location: [facility] ENDOSCOPY;  Service: Endoscopy;  Laterality: N/A;   ESOPHAGOGASTRODUODENOSCOPY (EGD) WITH PROPOFOL N/A 05/07/2019   Procedure: ESOPHAGOGASTRODUODENOSCOPY (EGD) WITH PROPOFOL;  Surgeon: Robert Bellow, MD;  Location: [facility] ENDOSCOPY;  Service: Endoscopy;  Laterality: N/A;   EYE SURGERY  2013   CATARACT EXTRACTION   GANGLION CYST EXCISION Right 05/18/2017   Procedure: REMOVAL GANGLION CYST ANKLE;  Surgeon: Samara Deist, DPM;  Location: [facility] ORS;  Service:  Podiatry;  Laterality: Right;   heart cath stent     LEFT HEART CATH AND CORONARY ANGIOGRAPHY Right 12/03/2017   Procedure: Left Heart Cath and Coronary Angiography with possible coronary intervention;  Surgeon: Dionisio David, MD;  Location: [place] CV LAB;  Service: Cardiovascular;  Laterality: Right;   PACEMAKER IMPLANT N/A 04/04/2021   Procedure: PACEMAKER IMPLANT;  Surgeon: Deboraha Sprang, MD;  Location: [place] CV LAB;  Service: Cardiovascular;  Laterality: N/A;    PROSTATE BIOPSY     RIGHT/LEFT HEART CATH AND CORONARY ANGIOGRAPHY N/A 02/07/2021   Procedure: RIGHT/LEFT HEART CATH AND CORONARY ANGIOGRAPHY;  Surgeon: [name], MD;  Location: [place] CV LAB;  Service: Cardiovascular;  Laterality: N/A;   stents     multiple   Family History  Problem Relation Age of Onset   Cancer Mother        gastric and lung   Cancer Father        multiple myeloma   Stroke Father    Cancer Sister        leukemia   Cancer Brother        leukemia   Cancer Brother        kidney   Cancer Daughter        Uterine   Cancer Other        [place] (Sister's Son): Prostate   Social History   Socioeconomic History   Marital status: Married    Spouse name: Not on file   Number of children: Not on file   Years of education: Not on file   Highest education level: Not on file  Occupational History   Not on file  Tobacco Use   Smoking status: Former    Packs/day: 1.00    Years: 35.00    Total pack years: 35.00    Types: Cigarettes    Quit date: 03/06/1986    Years since quitting: 35.5   Smokeless tobacco: Former    Types: Nurse, children's Use: Never used  Substance and Sexual Activity   Alcohol use: No   Drug use: No   Sexual activity: Not Currently  Other Topics Concern   Not on file  Social History Narrative   Not on file   Social Determinants of Health   Financial Resource Strain: Low Risk  (07/16/2020)   Overall Financial Resource Strain (CARDIA)    Difficulty of Paying Living Expenses: Not hard at all  Food Insecurity: No Food Insecurity (07/16/2020)   Hunger Vital Sign    Worried About Running Out of Food in the Last Year: Never true    [place] in the Last Year: Never true  Transportation Needs: No Transportation Needs (07/16/2020)   PRAPARE - Hydrologist (Medical): No    Lack of Transportation (Non-Medical): No  Physical Activity: Unknown (07/16/2020)   Exercise Vital Sign    Days of  Exercise per Week: 0 days    Minutes of Exercise per Session: Not on file  Stress: No Stress Concern Present (07/16/2020)   [place]    Feeling of Stress : Not at all  Social Connections: Unknown (07/16/2020)   Social Connection and Isolation Panel [NHANES]    Frequency of Communication with Friends and Family: Not on file    Frequency of Social Gatherings with Friends and Family: Not on file    Attends Religious Services:  Not on file    Active Member of Clubs or Organizations: Not on file    Attends Club or Organization Meetings: Not on file    Marital Status: Married     Review of Systems     Objective:     BP 132/68 (BP Location: Left Arm, Patient Position: Sitting, Cuff Size: Large)   Pulse 60   Temp 98.7 F (37.1 C) (Temporal)   Resp 17   Ht $R'5\' 11"'wu$  (1.803 m)   Wt 220 lb 6.4 oz (100 kg)   SpO2 97%   BMI 30.74 kg/m  Wt Readings from Last 3 Encounters:  09/26/21 220 lb 6.4 oz (100 kg)  07/26/21 219 lb (99.3 kg)  07/20/21 217 lb 12.8 oz (98.8 kg)    Physical Exam   Outpatient Encounter Medications as of 09/26/2021  Medication Sig   amLODipine (NORVASC) 2.5 MG tablet Take 1 tablet (2.5 mg total) by mouth daily.   aspirin EC 81 MG tablet Take 81 mg by mouth daily.   Calcium Carb-Cholecalciferol (CALCIUM + D3 PO) Take 1 tablet by mouth daily.   cholecalciferol (VITAMIN D) 1000 units tablet Take 1,000 Units by mouth daily.    clopidogrel (PLAVIX) 75 MG tablet TAKE 1 TABLET BY MOUTH ONCE DAILY   ferrous sulfate 324 MG TBEC Take 324 mg by mouth daily.   furosemide (LASIX) 20 MG tablet Take 1 tablet (20 mg total) by mouth daily.   isosorbide mononitrate (IMDUR) 30 MG 24 hr tablet TAKE 1 TABLET BY MOUTH ONCE DAILY   Leuprolide Acetate, 3 Month, (ELIGARD) 22.5 MG injection Inject 22.5 mg into the skin every 4 (four) months.   losartan (COZAAR) 50 MG tablet Take 1 tablet (50 mg total) by mouth daily.    lovastatin (MEVACOR) 40 MG tablet TAKE 2 TABLETS BY MOUTH AT BEDTIME   Multiple Vitamin (MULTI-VITAMINS) TABS Take 1 tablet by mouth daily.    pantoprazole (PROTONIX) 40 MG tablet Take 40 mg by mouth 2 (two) times daily.    tamsulosin (FLOMAX) 0.4 MG CAPS capsule Take 0.4 mg by mouth daily.   No facility-administered encounter medications on file as of 09/26/2021.     Lab Results  Component Value Date   WBC 5.3 07/20/2021   HGB 13.0 07/20/2021   HCT 37.7 (L) 07/20/2021   PLT 184 07/20/2021   GLUCOSE 104 (H) 09/21/2021   CHOL 117 09/21/2021   TRIG 99.0 09/21/2021   HDL 32.70 (L) 09/21/2021   LDLCALC 64 09/21/2021   ALT 12 09/21/2021   AST 18 09/21/2021   NA 139 09/21/2021   K 3.9 09/21/2021   CL 106 09/21/2021   CREATININE 1.19 09/21/2021   BUN 19 09/21/2021   CO2 24 09/21/2021   TSH 2.19 09/21/2021   PSA 5.47 (H) 08/03/2016   HGBA1C 5.9 09/21/2021    DG Chest 2 View  Result Date: 04/04/2021 CLINICAL DATA:  Postop.  Status post pacer placement. EXAM: CHEST - 2 VIEW COMPARISON:  03/15/2021 FINDINGS: Interval placement of left chest wall pacer device with lead in the right atrial appendage and right ventricle. No pneumothorax identified following pacer placement. Heart size is normal. No pleural effusion or edema. Calcified granuloma is again noted in the right upper lobe. IMPRESSION: Status post pacer placement. No pneumothorax. Electronically Signed   By: Kerby Moors M.D.   On: 04/04/2021 18:43       Assessment & Plan:   Problem List Items Addressed This Visit   None  Delores Thelen, MD  

## 2021-09-27 ENCOUNTER — Encounter: Payer: Self-pay | Admitting: Internal Medicine

## 2021-09-27 DIAGNOSIS — R3915 Urgency of urination: Secondary | ICD-10-CM | POA: Insufficient documentation

## 2021-09-27 NOTE — Assessment & Plan Note (Signed)
Just saw GI as outlined.  Continue PPI.  Elected to hold on EGD.  Follow.

## 2021-09-27 NOTE — Assessment & Plan Note (Signed)
Blood pressure as outlined.  On imdur, amlodipine and losartan.  Continue to follow pressure.  Continue to monitor metabolic panel.   

## 2021-09-27 NOTE — Assessment & Plan Note (Signed)
Saw [facility] 01/2020.  Stable.  Recommended f/u in one year.  Carotid ultrasound 01/2021:  Right Carotid: Velocities in the right ICA are consistent with a 1-39% stenosis. Left Carotid: Velocities in the left ICA are consistent with a 1-39% stenosis. Vertebrals: Bilateral vertebral arteries demonstrate antegrade flow.

## 2021-09-27 NOTE — Assessment & Plan Note (Signed)
Continue cpap.  

## 2021-09-27 NOTE — Assessment & Plan Note (Signed)
Evaluated by [facility].  Evaluated 01/2020.  Stable.  Recommended f/u scanning in 2 years.  

## 2021-09-27 NOTE — Assessment & Plan Note (Signed)
Continue lovastatin.  Follow lipid panel and liver function tests.   

## 2021-09-27 NOTE — Assessment & Plan Note (Signed)
Continues on losartan, imdur and amlodipine. No chest pain.  Continue risk factor modification.

## 2021-09-27 NOTE — Assessment & Plan Note (Signed)
GFR improved recent check.  Continue to avoid antiinflammatories. Follow metabolic panel.  

## 2021-09-27 NOTE — Assessment & Plan Note (Signed)
[facility] evaluated 01/21/21 - recommended f/u in 12 months.

## 2021-09-27 NOTE — Assessment & Plan Note (Signed)
Follow met b and a1c.  

## 2021-09-27 NOTE — Assessment & Plan Note (Signed)
Not a significant issue for him now.  S/p pacemaker placement and has down well.   

## 2021-09-27 NOTE — Assessment & Plan Note (Signed)
Breathing stable.

## 2021-09-27 NOTE — Assessment & Plan Note (Signed)
Continue f/u with hematology.

## 2021-09-27 NOTE — Assessment & Plan Note (Signed)
With urinary frequency and urgency as outlined.  Just saw urology.  Recommended flomax.  Had questions about medication.  Discussed possible side effects.  D/w cardiology.  Urinary symptoms are affecting his quality of life.

## 2021-09-27 NOTE — Assessment & Plan Note (Signed)
Followed by hematology.  Has received IV iron.  Held last visit.   Follow.

## 2021-09-27 NOTE — Assessment & Plan Note (Signed)
Followed by cardiology.  ECHO - 05/2019 - EF 55-60%.  Continues on losartan, imdur and amlodipine.  Blood pressure doing well.  Stable.  

## 2021-09-29 ENCOUNTER — Telehealth: Payer: Self-pay | Admitting: Internal Medicine

## 2021-09-29 NOTE — Telephone Encounter (Signed)
Spoke with patient spouse she req CB 10/05/21

## 2021-10-03 ENCOUNTER — Other Ambulatory Visit: Payer: Self-pay | Admitting: Cardiovascular Disease

## 2021-10-03 NOTE — Telephone Encounter (Signed)
Good Morning. Could you schedule this patient a 6 month follow up? This patient was last seen by Dr. Fletcher Anon 06/2021. Thank you so much.

## 2021-10-04 ENCOUNTER — Ambulatory Visit (INDEPENDENT_AMBULATORY_CARE_PROVIDER_SITE_OTHER): Payer: Medicare HMO

## 2021-10-04 VITALS — Ht 71.0 in | Wt 220.0 lb

## 2021-10-04 DIAGNOSIS — Z0000 Encounter for general adult medical examination without abnormal findings: Secondary | ICD-10-CM

## 2021-10-04 NOTE — Progress Notes (Signed)
Subjective:   Nathaniel Thompson is a 86 y.o. male who presents for Medicare Annual/Subsequent preventive examination.  Review of Systems    No ROS.  Medicare Wellness Virtual Visit.  Visual/audio telehealth visit, UTA vital signs.   See social history for additional risk factors.   Cardiac Risk Factors include: advanced age (>5mn, >>28women);male gender     Objective:    Today's Vitals   10/04/21 1335  Weight: 220 lb (99.8 kg)  Height: _0  (1.803 m)   Body mass index is 30.68 kg/m.     10/04/2021    1:40 PM 07/20/2021    1:02 PM 04/04/2021   12:32 PM 03/15/2021    5:07 AM 02/07/2021    9:04 AM 11/19/2020    1:29 PM 08/18/2020    1:10 PM  Advanced Directives  Does Patient Have a Medical Advance Directive? Yes Yes Yes No Yes No No  Type of AParamed[place]of [place]Living will [place] will [place] will  [place] will    Does patient want to make changes to medical advance directive? No - Patient declined  No - Patient declined      Copy of H[place] Chart? No - copy requested  No - copy requested  No - copy requested    Would patient like information on creating a medical advance directive?      No - Patient declined No - Patient declined    Current Medications (verified) Outpatient Encounter Medications as of 10/04/2021  Medication Sig   amLODipine (NORVASC) 2.5 MG tablet Take 1 tablet (2.5 mg total) by mouth daily.   aspirin EC 81 MG tablet Take 81 mg by mouth daily.   Calcium Carb-Cholecalciferol (CALCIUM + D3 PO) Take 1 tablet by mouth daily.   cholecalciferol (VITAMIN D) 1000 units tablet Take 1,000 Units by mouth daily.    clopidogrel (PLAVIX) 75 MG tablet TAKE 1 TABLET BY MOUTH ONCE DAILY   ferrous sulfate 324 MG TBEC Take 324 mg by mouth daily.   furosemide (LASIX) 20 MG tablet Take 1 tablet (20 mg total) by mouth daily.   isosorbide  mononitrate (IMDUR) 30 MG 24 hr tablet TAKE 1 TABLET BY MOUTH ONCE DAILY   Leuprolide Acetate, 3 Month, (ELIGARD) 22.5 MG injection Inject 22.5 mg into the skin every 4 (four) months.   losartan (COZAAR) 50 MG tablet Take 1 tablet (50 mg total) by mouth daily.   lovastatin (MEVACOR) 40 MG tablet TAKE 2 TABLETS BY MOUTH AT BEDTIME   Multiple Vitamin (MULTI-VITAMINS) TABS Take 1 tablet by mouth daily.    pantoprazole (PROTONIX) 40 MG tablet Take 40 mg by mouth 2 (two) times daily.    tamsulosin (FLOMAX) 0.4 MG CAPS capsule Take 0.4 mg by mouth daily.   No facility-administered encounter medications on file as of 10/04/2021.    Allergies (verified) Atorvastatin   History: Past Medical History:  Diagnosis Date   (HFpEF) heart failure with preserved ejection fraction ([place]    a. 05/2018 Echo: Nl EF, Gr1 DD, sev dil LA, mild MR/TR, mild PAH; b. 05/2019 Echo: EF 55-60%, no rwma, mild LVH. Nl PASP. Mildly dil LA. Triv MR. Mild AoV sclerosis w/o stenosis. Mildly dil Asc AO (361m.   Allergic rhinitis    Barrett's esophagus 10/21/2013   Bone cancer (HCC)    CKD (chronic kidney disease), stage III (HCC)    Colon polyp, hyperplastic  COPD (chronic obstructive pulmonary disease) (HCC)    mild COPD. former smoker   Coronary artery disease    a. 2003 s/p PCI ([facility]); b. 2004 s/p PCI x 2 ([facility]); c. 11/2017 Cath: LM nl, LAD mild dzs, LCX patent stent w/ 30% distal edge restenosis, RCA patent distal stent w/ 30% prox edge stenosis. Nl EF->Med Rx;   Diverticulosis    Fundic gland polyps of stomach, benign    Gastritis    GERD (gastroesophageal reflux disease)    Hypercholesteremia    Hypertension    Prostate cancer ([place])    Prostatism    Reflux esophagitis    Renal stones    Skin cancer    left cheek/lesion excised   Sleep apnea    Tachycardia    a. 11/2017 admit w/ tachycardia/? afib-->no afib noted upon review (amio/OAC d/c'd).   TIA (transient ischemic attack)    Past Surgical History:   Procedure Laterality Date   CARDIAC CATHETERIZATION     COLONOSCOPY WITH PROPOFOL N/A 09/13/2015   Procedure: COLONOSCOPY WITH PROPOFOL;  Surgeon: Lollie Sails, MD;  Location: [facility] ENDOSCOPY;  Service: Endoscopy;  Laterality: N/A;   COLONOSCOPY WITH PROPOFOL N/A 05/07/2019   Procedure: COLONOSCOPY WITH PROPOFOL;  Surgeon: Robert Bellow, MD;  Location: [facility] ENDOSCOPY;  Service: Endoscopy;  Laterality: N/A;   CORONARY ANGIOPLASTY     ESOPHAGOGASTRODUODENOSCOPY (EGD) WITH PROPOFOL N/A 09/13/2015   Procedure: ESOPHAGOGASTRODUODENOSCOPY (EGD) WITH PROPOFOL;  Surgeon: Lollie Sails, MD;  Location[facility] ENDOSCOPY;  Service: Endoscopy;  Laterality: N/A;   ESOPHAGOGASTRODUODENOSCOPY (EGD) WITH PROPOFOL N/A 12/28/2015   Procedure: ESOPHAGOGASTRODUODENOSCOPY (EGD) WITH PROPOFOL;  Surgeon: Lollie Sails, MD;  Location: [facility] ENDOSCOPY;  Service: Endoscopy;  Laterality: N/A;   ESOPHAGOGASTRODUODENOSCOPY (EGD) WITH PROPOFOL N/A 06/16/2016   Procedure: ESOPHAGOGASTRODUODENOSCOPY (EGD) WITH PROPOFOL;  Surgeon: Lollie Sails, MD;  Location: [facility] ENDOSCOPY;  Service: Endoscopy;  Laterality: N/A;   ESOPHAGOGASTRODUODENOSCOPY (EGD) WITH PROPOFOL N/A 05/07/2019   Procedure: ESOPHAGOGASTRODUODENOSCOPY (EGD) WITH PROPOFOL;  Surgeon: Robert Bellow, MD;  Location: [facility] ENDOSCOPY;  Service: Endoscopy;  Laterality: N/A;   EYE SURGERY  2013   CATARACT EXTRACTION   GANGLION CYST EXCISION Right 05/18/2017   Procedure: REMOVAL GANGLION CYST ANKLE;  Surgeon: Samara Deist, DPM;  Location: [facility] ORS;  Service: Podiatry;  Laterality: Right;   heart cath stent     LEFT HEART CATH AND CORONARY ANGIOGRAPHY Right 12/03/2017   Procedure: Left Heart Cath and Coronary Angiography with possible coronary intervention;  Surgeon: Dionisio David, MD;  Location: [place] CV LAB;  Service: Cardiovascular;  Laterality: Right;   PACEMAKER IMPLANT N/A 04/04/2021   Procedure: PACEMAKER IMPLANT;  Surgeon: Deboraha Sprang,  MD;  Location: [place] CV LAB;  Service: Cardiovascular;  Laterality: N/A;   PROSTATE BIOPSY     RIGHT/LEFT HEART CATH AND CORONARY ANGIOGRAPHY N/A 02/07/2021   Procedure: RIGHT/LEFT HEART CATH AND CORONARY ANGIOGRAPHY;  Surgeon: [name], MD;  Location: [place] CV LAB;  Service: Cardiovascular;  Laterality: N/A;   stents     multiple   Family History  Problem Relation Age of Onset   Cancer Mother        gastric and lung   Cancer Father        multiple myeloma   Stroke Father    Cancer Sister        leukemia   Cancer Brother        leukemia   Cancer Brother  kidney   Cancer Daughter        Uterine   Cancer Other        [place] (Sister's Son): Prostate   Social History   Socioeconomic History   Marital status: Married    Spouse name: Not on file   Number of children: Not on file   Years of education: Not on file   Highest education level: Not on file  Occupational History   Not on file  Tobacco Use   Smoking status: Former    Packs/day: 1.00    Years: 35.00    Total pack years: 35.00    Types: Cigarettes    Quit date: 03/06/1986    Years since quitting: 35.6   Smokeless tobacco: Former    Types: Nurse, children's Use: Never used  Substance and Sexual Activity   Alcohol use: No   Drug use: No   Sexual activity: Not Currently  Other Topics Concern   Not on file  Social History Narrative   Not on file   Social Determinants of Health   Financial Resource Strain: Low Risk  (10/04/2021)   Overall Financial Resource Strain (CARDIA)    Difficulty of Paying Living Expenses: Not hard at all  Food Insecurity: No Food Insecurity (10/04/2021)   Hunger Vital Sign    Worried About Running Out of Food in the Last Year: Never true    [place] in the Last Year: Never true  Transportation Needs: No Transportation Needs (10/04/2021)   PRAPARE - Hydrologist (Medical): No    Lack of Transportation (Non-Medical):  No  Physical Activity: Insufficiently Active (10/04/2021)   Exercise Vital Sign    Days of Exercise per Week: 4 days    Minutes of Exercise per Session: 30 min  Stress: No Stress Concern Present (10/04/2021)   [place]    Feeling of Stress : Not at all  Social Connections: Unknown (10/04/2021)   Social Connection and Isolation Panel [NHANES]    Frequency of Communication with Friends and Family: Not on file    Frequency of Social Gatherings with Friends and Family: Not on file    Attends Religious Services: Not on file    Active Member of Clubs or Organizations: Not on file    Attends Archivist Meetings: Not on file    Marital Status: Married   Tobacco Counseling Counseling given: Not Answered   Clinical Intake: Pre-visit preparation completed: Yes        Diabetes: No  How often do you need to have someone help you when you read instructions, pamphlets, or other written materials from your doctor or pharmacy?: 1 - Never   Interpreter Needed?: No    Activities of Daily Living    10/04/2021    1:42 PM 02/07/2021    8:55 AM  In your present state of health, do you have any difficulty performing the following activities:  Hearing? 1 1  Comment Hearing aids   Vision? 0 0  Difficulty concentrating or making decisions? 0 0  Walking or climbing stairs? 0 0  Dressing or bathing? 0 0  Doing errands, shopping? 0   Preparing Food and eating ? N   Using the Toilet? N   In the past six months, have you accidently leaked urine? N   Do you have problems with loss of bowel control? N   Managing your  Medications? N   Managing your Finances? N   Housekeeping or managing your Housekeeping? N    Patient Care Team: Einar Pheasant, MD as PCP - General (Internal Medicine) [name], MD as PCP - Cardiology (Cardiology) [name], MD as Consulting Physician (Cardiology) Cammie Sickle, MD  as Consulting Physician (Hematology and Oncology)  Indicate any recent Medical Services you may have received from other than [facility] providers in the past year (date may be approximate).     Assessment:   This is a routine wellness examination for Khalen.  Virtual Visit via Telephone Note  I connected with  [name] on 10/04/21 at  1:30 PM EDT by telephone and verified that I am speaking with the correct person using two identifiers.  Persons participating in the virtual visit: patient/Nurse Health Advisor   I discussed the limitations of performing an evaluation and management service by telehealth. We continued and completed visit with audio only. Some vital signs may be absent or patient reported.   Hearing/Vision screen Hearing Screening - Comments:: Hearing aids Vision Screening - Comments:: Cataract extraction, bilateral They have seen their ophthalmologist  Dietary issues and exercise activities discussed: Current Exercise Habits: Home exercise routine, Type of exercise: walking;strength training/weights;stretching, Time (Minutes): 30, Frequency (Times/Week): 4, Weekly Exercise (Minutes/Week): 120, Intensity: Mild Healthy diet Good water intake   Goals Addressed             This Visit's Progress    Follow up with pcp as needed       Routine maintenance.       Depression Screen    10/04/2021    1:38 PM 09/26/2021    7:10 AM 05/25/2021   11:31 AM 01/12/2021    8:52 AM 10/11/2020    2:46 PM 07/16/2020    8:31 AM 07/16/2019    9:46 AM  PHQ 2/9 Scores  PHQ - 2 Score 0 0 0 0 0 0 0    Fall Risk    10/04/2021    1:41 PM 09/26/2021    7:10 AM 05/25/2021   11:31 AM 01/12/2021    8:52 AM 10/11/2020    2:45 PM  [place] in the past year? 0 0 0 0 0  Number falls in past yr: 0 0  0 0  Injury with Fall? 0 0  0 0  Risk for fall due to :  No Fall Risks No Fall Risks No Fall Risks   Follow up Falls evaluation completed Falls evaluation completed Falls evaluation  completed Falls evaluation completed Falls evaluation completed    [place]: Home free of loose throw rugs in walkways, pet beds, electrical cords, etc? Yes  Adequate lighting in your home to reduce risk of falls? Yes   ASSISTIVE DEVICES UTILIZED TO PREVENT FALLS: Life alert? No  Use of a cane, walker or w/c? No   TIMED UP AND GO: Was the test performed? No .   Cognitive Function:  Patient is alert and oriented x3.       07/16/2019    9:46 AM 07/15/2018    1:47 PM 07/11/2017    9:00 AM  6CIT Screen  What Year? 0 points 0 points 0 points  What month? 0 points 0 points 0 points  What time? 0 points 0 points 0 points  Count back from 20  0 points 0 points  Months in reverse 0 points 0 points 0 points  Repeat  phrase  0 points 0 points  Total Score  0 points 0 points    Immunizations Immunization History  Administered Date(s) Administered   Fluad Quad(high Dose 65+) 11/05/2018, 11/06/2019, 11/17/2020   Influenza, High Dose Seasonal PF 11/28/2016   Influenza-Unspecified 12/05/2011, 12/05/2015, 11/04/2017   PFIZER(Purple Top)SARS-COV-2 Vaccination 03/25/2019, 04/19/2019, 12/11/2019   Pneumococcal Conjugate-13 07/12/2016   Pneumococcal Polysaccharide-23 03/06/2006   Zoster Recombinat (Shingrix) 01/07/2019, 03/10/2019    Screening Tests Health Maintenance  Topic Date Due   INFLUENZA VACCINE  10/04/2021   COVID-19 Vaccine (6 - Booster for Pfizer series) 10/12/2021 (Originally 02/05/2020)   TETANUS/TDAP  12/01/2021 (Originally 12/03/1954)   Pneumonia Vaccine 47+ Years old  Completed   Zoster Vaccines- Shingrix  Completed   HPV VACCINES  Aged Out   Health Maintenance Health Maintenance Due  Topic Date Due   INFLUENZA VACCINE  10/04/2021   Lung Cancer Screening: (Low Dose CT Chest recommended if Age 65-80 years, 30 pack-year currently smoking OR have quit w/in 15years.) does not qualify.   Vision Screening: Recommended annual ophthalmology  exams for early detection of glaucoma and other disorders of the eye.  Dental Screening: Recommended annual dental exams for proper oral hygiene  Community Resource Referral / Chronic Care Management: CRR required this visit?  No   CCM required this visit?  No      Plan:   Keep all routine maintenance appointments.   I have personally reviewed and noted the following in the patient's chart:   Medical and social history Use of alcohol, tobacco or illicit drugs  Current medications and supplements including opioid prescriptions. Patient is not currently taking opioid prescriptions. Functional ability and status Nutritional status Physical activity Advanced directives List of other physicians Hospitalizations, surgeries, and ER visits in previous 12 months Vitals Screenings to include cognitive, depression, and falls Referrals and appointments  In addition, I have reviewed and discussed with patient certain preventive protocols, quality metrics, and best practice recommendations. A written personalized care plan for preventive services as well as general preventive health recommendations were provided to patient.     Varney Biles, LPN   4/0/0867

## 2021-10-04 NOTE — Patient Instructions (Addendum)
  Mr. Nathaniel Thompson , Thank you for taking time to come for your Medicare Wellness Visit. I appreciate your ongoing commitment to your health goals. Please review the following plan we discussed and let me know if I can assist you in the future.   These are the goals we discussed:  Goals      Follow up with pcp as needed     Routine maintenance.        This is a list of the screening recommended for you and due dates:  Health Maintenance  Topic Date Due   Flu Shot  10/04/2021   COVID-19 Vaccine (6 - Booster for Pfizer series) 10/12/2021*   Tetanus Vaccine  12/01/2021*   Pneumonia Vaccine  Completed   Zoster (Shingles) Vaccine  Completed   HPV Vaccine  Aged Out  *Topic was postponed. The date shown is not the original due date.

## 2021-10-11 ENCOUNTER — Ambulatory Visit (INDEPENDENT_AMBULATORY_CARE_PROVIDER_SITE_OTHER): Payer: Medicare HMO

## 2021-10-11 DIAGNOSIS — I495 Sick sinus syndrome: Secondary | ICD-10-CM

## 2021-10-11 LAB — CUP PACEART REMOTE DEVICE CHECK
Battery Remaining Longevity: 161 mo
Battery Voltage: 3.15 V
Brady Statistic AP VP Percent: 0.25 %
Brady Statistic AP VS Percent: 87.39 %
Brady Statistic AS VP Percent: 0.14 %
Brady Statistic AS VS Percent: 12.22 %
Brady Statistic RA Percent Paced: 88.56 %
Brady Statistic RV Percent Paced: 0.4 %
Date Time Interrogation Session: 20230808073629
Implantable Lead Implant Date: 20230130
Implantable Lead Implant Date: 20230130
Implantable Lead Location: 753859
Implantable Lead Location: 753860
Implantable Lead Model: 3830
Implantable Lead Model: 5076
Implantable Pulse Generator Implant Date: 20230130
Lead Channel Impedance Value: 304 Ohm
Lead Channel Impedance Value: 380 Ohm
Lead Channel Impedance Value: 475 Ohm
Lead Channel Impedance Value: 570 Ohm
Lead Channel Pacing Threshold Amplitude: 0.75 V
Lead Channel Pacing Threshold Amplitude: 0.75 V
Lead Channel Pacing Threshold Pulse Width: 0.4 ms
Lead Channel Pacing Threshold Pulse Width: 0.4 ms
Lead Channel Sensing Intrinsic Amplitude: 1.5 mV
Lead Channel Sensing Intrinsic Amplitude: 1.5 mV
Lead Channel Sensing Intrinsic Amplitude: 2.25 mV
Lead Channel Sensing Intrinsic Amplitude: 2.25 mV
Lead Channel Setting Pacing Amplitude: 1.5 V
Lead Channel Setting Pacing Amplitude: 2 V
Lead Channel Setting Pacing Pulse Width: 0.4 ms
Lead Channel Setting Sensing Sensitivity: 0.9 mV

## 2021-11-02 ENCOUNTER — Other Ambulatory Visit: Payer: Self-pay | Admitting: Cardiovascular Disease

## 2021-11-11 DIAGNOSIS — M1712 Unilateral primary osteoarthritis, left knee: Secondary | ICD-10-CM | POA: Insufficient documentation

## 2021-11-16 NOTE — Progress Notes (Signed)
Remote pacemaker transmission.   

## 2021-11-21 ENCOUNTER — Ambulatory Visit: Payer: Medicare HMO

## 2021-11-21 ENCOUNTER — Inpatient Hospital Stay: Payer: Medicare HMO

## 2021-11-21 ENCOUNTER — Inpatient Hospital Stay: Payer: Medicare HMO | Admitting: Medical Oncology

## 2021-11-21 ENCOUNTER — Inpatient Hospital Stay: Payer: Medicare HMO | Attending: Internal Medicine

## 2021-11-21 ENCOUNTER — Ambulatory Visit: Payer: Medicare HMO | Admitting: Internal Medicine

## 2021-11-21 ENCOUNTER — Other Ambulatory Visit: Payer: Medicare HMO

## 2021-11-21 ENCOUNTER — Encounter: Payer: Self-pay | Admitting: Medical Oncology

## 2021-11-21 VITALS — BP 119/66 | HR 79 | Temp 96.4°F | Wt 216.0 lb

## 2021-11-21 DIAGNOSIS — C775 Secondary and unspecified malignant neoplasm of intrapelvic lymph nodes: Secondary | ICD-10-CM | POA: Diagnosis not present

## 2021-11-21 DIAGNOSIS — Z5111 Encounter for antineoplastic chemotherapy: Secondary | ICD-10-CM | POA: Insufficient documentation

## 2021-11-21 DIAGNOSIS — C61 Malignant neoplasm of prostate: Secondary | ICD-10-CM | POA: Insufficient documentation

## 2021-11-21 DIAGNOSIS — D500 Iron deficiency anemia secondary to blood loss (chronic): Secondary | ICD-10-CM | POA: Diagnosis not present

## 2021-11-21 DIAGNOSIS — N1831 Chronic kidney disease, stage 3a: Secondary | ICD-10-CM

## 2021-11-21 DIAGNOSIS — C7951 Secondary malignant neoplasm of bone: Secondary | ICD-10-CM | POA: Insufficient documentation

## 2021-11-21 LAB — CBC WITH DIFFERENTIAL/PLATELET
Abs Immature Granulocytes: 0.02 10*3/uL (ref 0.00–0.07)
Basophils Absolute: 0 10*3/uL (ref 0.0–0.1)
Basophils Relative: 0 %
Eosinophils Absolute: 0.3 10*3/uL (ref 0.0–0.5)
Eosinophils Relative: 4 %
HCT: 39.3 % (ref 39.0–52.0)
Hemoglobin: 13.4 g/dL (ref 13.0–17.0)
Immature Granulocytes: 0 %
Lymphocytes Relative: 19 %
Lymphs Abs: 1.4 10*3/uL (ref 0.7–4.0)
MCH: 30.3 pg (ref 26.0–34.0)
MCHC: 34.1 g/dL (ref 30.0–36.0)
MCV: 88.9 fL (ref 80.0–100.0)
Monocytes Absolute: 0.9 10*3/uL (ref 0.1–1.0)
Monocytes Relative: 12 %
Neutro Abs: 4.8 10*3/uL (ref 1.7–7.7)
Neutrophils Relative %: 65 %
Platelets: 196 10*3/uL (ref 150–400)
RBC: 4.42 MIL/uL (ref 4.22–5.81)
RDW: 12.8 % (ref 11.5–15.5)
WBC: 7.5 10*3/uL (ref 4.0–10.5)
nRBC: 0 % (ref 0.0–0.2)

## 2021-11-21 LAB — COMPREHENSIVE METABOLIC PANEL
ALT: 16 U/L (ref 0–44)
AST: 18 U/L (ref 15–41)
Albumin: 3.9 g/dL (ref 3.5–5.0)
Alkaline Phosphatase: 52 U/L (ref 38–126)
Anion gap: 6 (ref 5–15)
BUN: 25 mg/dL — ABNORMAL HIGH (ref 8–23)
CO2: 26 mmol/L (ref 22–32)
Calcium: 9 mg/dL (ref 8.9–10.3)
Chloride: 106 mmol/L (ref 98–111)
Creatinine, Ser: 1.48 mg/dL — ABNORMAL HIGH (ref 0.61–1.24)
GFR, Estimated: 47 mL/min — ABNORMAL LOW (ref >60)
Glucose, Bld: 112 mg/dL — ABNORMAL HIGH (ref 70–99)
Potassium: 4.1 mmol/L (ref 3.5–5.1)
Sodium: 138 mmol/L (ref 135–145)
Total Bilirubin: 0.8 mg/dL (ref 0.3–1.2)
Total Protein: 6.9 g/dL (ref 6.5–8.1)

## 2021-11-21 LAB — PSA: Prostatic Specific Antigen: <0.01 ng/mL (ref 0.00–4.00)

## 2021-11-21 MED ORDER — LEUPROLIDE ACETATE (4 MONTH) 30 MG ~~LOC~~ KIT
30.0000 mg | PACK | Freq: Once | SUBCUTANEOUS | Status: AC
  Administered 2021-11-21: 30 mg via SUBCUTANEOUS
  Filled 2021-11-21: qty 30

## 2021-11-21 NOTE — Progress Notes (Signed)
[place] OFFICE PROGRESS NOTE  Patient Care Team: Einar Pheasant, MD as PCP - General (Internal Medicine) [name], MD as PCP - Cardiology (Cardiology) [name], MD as Consulting Physician (Cardiology) Cammie Sickle, MD as Consulting Physician (Hematology and Oncology)   Cancer Staging  No matching staging information was found for the patient.   Oncology History Overview Note  Nov 2017- completed IM RT radiation therapy to his prostate and pelvic nodes for Gleason 7 (4+3) adenocarcinoma the prostate presenting the PSA of 7.8.  # JAN 2019- METASTATIC PROSTATE CA to Bone [low volume met on bone scan; CT- NED]; PSA ~50; March 25th 2019- Lupron 45 mg IM [Urology q 13M]; June 26, 2018-Eligard every 3 months[intolerance to Lupron/question A. Fib/injection site pain  # May 23rd Zytiga 219m/day; no prednisone; STOPPED MARCH 17th 2021-repeated severe hypokalemia; Eligard  # Barretts- EGD/ colo [[place]2021; Dr.Byrnett].   # OBenita Stabile [place]]; Oct 2019-paroxysmal A.fib/not on eliquis; [Dr.Khan] -Dr.Arida ; Dizzy spells [Dr.Shah] s/p PPM [2023]  ------------------------------------------------------------------  DIAGNOSIS: [ JAN 2019]- Met- PROSTATE CANCER  STAGE:  IV       ;GOALS: PALLIATIVE     Prostate cancer ([place]    INTERVAL HISTORY: Patient is with his wife.  He is walking independently.  Nathaniel [id number].  male pleasant patient above history of metastatic prostate cancer on ADT is here for follow-up.  He is here with his wife today. They report that he continues to improve [place]. 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 Overall very pleased with his quality of life.  Still getting some hot flashes and mild joint pains. No reported difficulty with urination.  Review of Systems  Constitutional:  Negative for chills, diaphoresis, fever, malaise/fatigue and  weight loss.  HENT:  Negative for nosebleeds and sore throat.   Eyes:  Negative for double vision.  Respiratory:  Negative for cough, hemoptysis, sputum production, shortness of breath and wheezing.   Cardiovascular:  Negative for chest pain, palpitations, orthopnea and leg swelling.  Gastrointestinal:  Negative for abdominal pain, blood in stool, constipation, diarrhea, heartburn, melena, nausea and vomiting.  Genitourinary:  Negative for dysuria, frequency and urgency.  Musculoskeletal:  Positive for myalgias. Negative for back pain and joint pain.  Skin: Negative.  Negative for itching and rash.  Neurological:  Negative for tingling, focal weakness, weakness and headaches.  Endo/Heme/Allergies:  Does not bruise/bleed easily.  Psychiatric/Behavioral:  Negative for depression. The patient is not nervous/anxious and does not have insomnia.     PAST MEDICAL HISTORY :  Past Medical History:  Diagnosis Date   (HFpEF) heart failure with preserved ejection fraction ([place]    a. 05/2018 Echo: Nl EF, Gr1 DD, sev dil LA, mild MR/TR, mild PAH; b. 05/2019 Echo: EF 55-60%, no rwma, mild LVH. Nl PASP. Mildly dil LA. Triv MR. Mild AoV sclerosis w/o stenosis. Mildly dil Asc AO (357m.   Allergic rhinitis    Barrett's esophagus 10/21/2013   Bone cancer (HCC)    CKD (chronic kidney disease), stage III (HCC)    Colon polyp, hyperplastic    COPD (chronic obstructive pulmonary disease) (HCC)    mild COPD. former smoker   Coronary artery disease    a. 2003 s/p PCI ([facility]); b. 2004 s/p PCI x 2 ([facility]); c. 11/2017 Cath: LM nl, LAD mild dzs, LCX patent stent w/ 30% distal edge restenosis, RCA patent distal stent  w/ 30% prox edge stenosis. Nl EF->Med Rx;   Diverticulosis    Fundic gland polyps of stomach, benign    Gastritis    GERD (gastroesophageal reflux disease)    Hypercholesteremia    Hypertension    Prostate cancer ([place])    Prostatism    Reflux esophagitis    Renal stones    Skin cancer    left  cheek/lesion excised   Sleep apnea    Tachycardia    a. 11/2017 admit w/ tachycardia/? afib-->no afib noted upon review (amio/OAC d/c'd).   TIA (transient ischemic attack)     PAST SURGICAL HISTORY :   Past Surgical History:  Procedure Laterality Date   CARDIAC CATHETERIZATION     COLONOSCOPY WITH PROPOFOL N/A 09/13/2015   Procedure: COLONOSCOPY WITH PROPOFOL;  Surgeon: Lollie Sails, MD;  Location: [facility] ENDOSCOPY;  Service: Endoscopy;  Laterality: N/A;   COLONOSCOPY WITH PROPOFOL N/A 05/07/2019   Procedure: COLONOSCOPY WITH PROPOFOL;  Surgeon: Robert Bellow, MD;  Location: [facility] ENDOSCOPY;  Service: Endoscopy;  Laterality: N/A;   CORONARY ANGIOPLASTY     ESOPHAGOGASTRODUODENOSCOPY (EGD) WITH PROPOFOL N/A 09/13/2015   Procedure: ESOPHAGOGASTRODUODENOSCOPY (EGD) WITH PROPOFOL;  Surgeon: Lollie Sails, MD;  Location: [facility] ENDOSCOPY;  Service: Endoscopy;  Laterality: N/A;   ESOPHAGOGASTRODUODENOSCOPY (EGD) WITH PROPOFOL N/A 12/28/2015   Procedure: ESOPHAGOGASTRODUODENOSCOPY (EGD) WITH PROPOFOL;  Surgeon: Lollie Sails, MD;  Location: [facility] ENDOSCOPY;  Service: Endoscopy;  Laterality: N/A;   ESOPHAGOGASTRODUODENOSCOPY (EGD) WITH PROPOFOL N/A 06/16/2016   Procedure: ESOPHAGOGASTRODUODENOSCOPY (EGD) WITH PROPOFOL;  Surgeon: Lollie Sails, MD;  Location: [facility] ENDOSCOPY;  Service: Endoscopy;  Laterality: N/A;   ESOPHAGOGASTRODUODENOSCOPY (EGD) WITH PROPOFOL N/A 05/07/2019   Procedure: ESOPHAGOGASTRODUODENOSCOPY (EGD) WITH PROPOFOL;  Surgeon: Robert Bellow, MD;  Location: [facility] ENDOSCOPY;  Service: Endoscopy;  Laterality: N/A;   EYE SURGERY  2013   CATARACT EXTRACTION   GANGLION CYST EXCISION Right 05/18/2017   Procedure: REMOVAL GANGLION CYST ANKLE;  Surgeon: Samara Deist, DPM;  Location: [facility] ORS;  Service: Podiatry;  Laterality: Right;   heart cath stent     LEFT HEART CATH AND CORONARY ANGIOGRAPHY Right 12/03/2017   Procedure: Left Heart Cath and Coronary Angiography with  possible coronary intervention;  Surgeon: Dionisio David, MD;  Location: [place] CV LAB;  Service: Cardiovascular;  Laterality: Right;   PACEMAKER IMPLANT N/A 04/04/2021   Procedure: PACEMAKER IMPLANT;  Surgeon: Deboraha Sprang, MD;  Location: [place] CV LAB;  Service: Cardiovascular;  Laterality: N/A;   PROSTATE BIOPSY     RIGHT/LEFT HEART CATH AND CORONARY ANGIOGRAPHY N/A 02/07/2021   Procedure: RIGHT/LEFT HEART CATH AND CORONARY ANGIOGRAPHY;  Surgeon: [name], MD;  Location: [place] CV LAB;  Service: Cardiovascular;  Laterality: N/A;   stents     multiple    FAMILY HISTORY :   Family History  Problem Relation Age of Onset   Cancer Mother        gastric and lung   Cancer Father        multiple myeloma   Stroke Father    Cancer Sister        leukemia   Cancer Brother        leukemia   Cancer Brother        kidney   Cancer Daughter        Uterine   Cancer Other        [employer] copy Son): Prostate    SOCIAL HISTORY:   Social History  Tobacco Use   Smoking status: Former    Packs/day: 1.00    Years: 35.00    Total pack years: 35.00    Types: Cigarettes    Quit date: 03/06/1986    Years since quitting: 35.7   Smokeless tobacco: Former    Types: Nurse, children's Use: Never used  Substance Use Topics   Alcohol use: No   Drug use: No    ALLERGIES:  is allergic to atorvastatin.  MEDICATIONS:  Current Outpatient Medications  Medication Sig Dispense Refill   amLODipine (NORVASC) 2.5 MG tablet Take 1 tablet (2.5 mg total) by mouth daily. 90 tablet 2   aspirin EC 81 MG tablet Take 81 mg by mouth daily.     Calcium Carb-Cholecalciferol (CALCIUM + D3 PO) Take 1 tablet by mouth daily.     cholecalciferol (VITAMIN D) 1000 units tablet Take 1,000 Units by mouth daily.      clopidogrel (PLAVIX) 75 MG tablet TAKE 1 TABLET BY MOUTH ONCE DAILY 90 tablet 0   ferrous sulfate 324 MG TBEC Take 324 mg by mouth daily.     furosemide (LASIX) 20 MG  tablet TAKE 1 TABLET BY MOUTH ONCE DAILY 90 tablet 2   isosorbide mononitrate (IMDUR) 30 MG 24 hr tablet TAKE 1 TABLET BY MOUTH ONCE DAILY 90 tablet 2   Leuprolide Acetate, 3 Month, (ELIGARD) 22.5 MG injection Inject 22.5 mg into the skin every 4 (four) months.     losartan (COZAAR) 50 MG tablet TAKE 1 TABLET BY MOUTH ONCE DAILY 90 tablet 1   lovastatin (MEVACOR) 40 MG tablet TAKE 2 TABLETS BY MOUTH AT BEDTIME 180 tablet 1   Multiple Vitamin (MULTI-VITAMINS) TABS Take 1 tablet by mouth daily.      pantoprazole (PROTONIX) 40 MG tablet Take 40 mg by mouth 2 (two) times daily.      tamsulosin (FLOMAX) 0.4 MG CAPS capsule Take 0.4 mg by mouth daily.     No current facility-administered medications for this visit.    PHYSICAL EXAMINATION: ECOG PERFORMANCE STATUS: 0 - Asymptomatic  BP 119/66 (BP Location: Left Arm, Patient Position: Sitting)   Pulse 79   Temp (!) 96.4 F (35.8 C) (Tympanic)   Wt 216 lb (98 kg)   BMI 30.13 kg/m   Filed Weights   11/21/21 1318  Weight: 216 lb (98 kg)    Physical Exam Constitutional:      Appearance: He is obese.  HENT:     Head: Normocephalic and atraumatic.     Mouth/Throat:     Pharynx: No oropharyngeal exudate.  Eyes:     Pupils: Pupils are equal, round, and reactive to light.  Cardiovascular:     Rate and Rhythm: Normal rate and regular rhythm.  Pulmonary:     Effort: No respiratory distress.     Breath sounds: No wheezing.  Abdominal:     General: Bowel sounds are normal. There is no distension.     Palpations: Abdomen is soft. There is no mass.     Tenderness: There is no abdominal tenderness. There is no guarding or rebound.  Musculoskeletal:        General: No tenderness. Normal range of motion.     Cervical back: Normal range of motion and neck supple.  Skin:    General: Skin is warm.  Neurological:     Mental Status: He is alert and oriented to person, place, and time.  Psychiatric:  Mood and Affect: Affect normal.      LABORATORY DATA:  I have reviewed the data as listed    Component Value Date/Time   NA 138 11/21/2021 1258   NA 141 02/04/2021 0810   NA 140 09/16/2013 0438   K 4.1 11/21/2021 1258   K 4.2 09/16/2013 0438   CL 106 11/21/2021 1258   CL 109 (H) 09/16/2013 0438   CO2 26 11/21/2021 1258   CO2 24 09/16/2013 0438   GLUCOSE 112 (H) 11/21/2021 1258   GLUCOSE 101 (H) 09/16/2013 0438   BUN 25 (H) 11/21/2021 1258   BUN 18 02/04/2021 0810   BUN 20 (H) 09/16/2013 0438   CREATININE 1.48 (H) 11/21/2021 1258   CREATININE 1.28 09/16/2013 0438   CALCIUM 9.0 11/21/2021 1258   CALCIUM 8.3 (L) 09/16/2013 0438   PROT 6.9 11/21/2021 1258   PROT 6.7 09/15/2013 0755   ALBUMIN 3.9 11/21/2021 1258   ALBUMIN 3.5 09/15/2013 0755   AST 18 11/21/2021 1258   AST 37 09/15/2013 0755   ALT 16 11/21/2021 1258   ALT 28 09/15/2013 0755   ALKPHOS 52 11/21/2021 1258   ALKPHOS 44 (L) 09/15/2013 0755   BILITOT 0.8 11/21/2021 1258   BILITOT 0.5 09/15/2013 0755   GFRNONAA 47 (L) 11/21/2021 1258   GFRNONAA 54 (L) 09/16/2013 0438   GFRAA 59 (L) 10/07/2019 1305   GFRAA >60 09/16/2013 0438    No results found for: "SPEP", "UPEP"  Lab Results  Component Value Date   WBC 7.5 11/21/2021   NEUTROABS 4.8 11/21/2021   HGB 13.4 11/21/2021   HCT 39.3 11/21/2021   MCV 88.9 11/21/2021   PLT 196 11/21/2021      Chemistry      Component Value Date/Time   NA 138 11/21/2021 1258   NA 141 02/04/2021 0810   NA 140 09/16/2013 0438   K 4.1 11/21/2021 1258   K 4.2 09/16/2013 0438   CL 106 11/21/2021 1258   CL 109 (H) 09/16/2013 0438   CO2 26 11/21/2021 1258   CO2 24 09/16/2013 0438   BUN 25 (H) 11/21/2021 1258   BUN 18 02/04/2021 0810   BUN 20 (H) 09/16/2013 0438   CREATININE 1.48 (H) 11/21/2021 1258   CREATININE 1.28 09/16/2013 0438      Component Value Date/Time   CALCIUM 9.0 11/21/2021 1258   CALCIUM 8.3 (L) 09/16/2013 0438   ALKPHOS 52 11/21/2021 1258   ALKPHOS 44 (L) 09/15/2013 0755   AST 18  11/21/2021 1258   AST 37 09/15/2013 0755   ALT 16 11/21/2021 1258   ALT 28 09/15/2013 0755   BILITOT 0.8 11/21/2021 1258   BILITOT 0.5 09/15/2013 0755       RADIOGRAPHIC STUDIES: I have personally reviewed the radiological images as listed and agreed with the findings in the report. No results found.   ASSESSMENT & PLAN:  Encounter Diagnoses  Name Primary?   Prostate cancer ([place]) Yes   Malignant neoplasm of prostate ([place])    Iron deficiency anemia due to chronic blood loss    Stage 3a chronic kidney disease ([place])    Prostate cancer ([place]) # [place] sensitive-metastatic prostate cancer to the bone. Stage IV-on Eligard; Bone scan October 2020-improved;  JAN 2023- PSA- <0.1. Current PSA pending. Proceed with eligard q4M today. Previously discussed going from every 4 month interval to every 6 months. He will likely want to switch to 6 months at his next visit but is still considering this.    #  Iron deficient anemia/CKD- III-[s/p EGD March 2021]--status post IV iron infusion x1 [FEB 2022]-Hemoglobin is 13.4 today. Continue his oral iron supplementation. No IV Venofer needed today.    #CKD stage III-GFR 47- Chronic. Clinically stable.   # A. fib paroxysmal-sinus rhythm; dizzy spells- a.fib-s/p ppm aspirin Plavix- Regular rate and rhythm today. STABLE.    # DISPOSITION:  # Eligard today; HOLD  Venofer # Follow up in 4 months; MD;labs- cbc.cmp/PSA eligard; possible venofer- [place]   Cc; Dr.Scott/   No orders of the defined types were placed in this encounter.  All questions were answered. The patient knows to call the clinic with any problems, questions or concerns.      Hughie Closs, PA-C 11/21/2021 3:10 PM

## 2021-11-24 ENCOUNTER — Ambulatory Visit (INDEPENDENT_AMBULATORY_CARE_PROVIDER_SITE_OTHER): Payer: Medicare HMO

## 2021-11-24 DIAGNOSIS — Z23 Encounter for immunization: Secondary | ICD-10-CM

## 2022-01-02 ENCOUNTER — Encounter (INDEPENDENT_AMBULATORY_CARE_PROVIDER_SITE_OTHER): Payer: Self-pay

## 2022-01-07 ENCOUNTER — Ambulatory Visit
Admission: EM | Admit: 2022-01-07 | Discharge: 2022-01-07 | Disposition: A | Discharge status: Home / Self Care | Payer: Medicare HMO | Attending: Emergency Medicine | Admitting: Emergency Medicine

## 2022-01-07 DIAGNOSIS — J069 Acute upper respiratory infection, unspecified: Secondary | ICD-10-CM | POA: Diagnosis present

## 2022-01-07 DIAGNOSIS — R059 Cough, unspecified: Secondary | ICD-10-CM | POA: Insufficient documentation

## 2022-01-07 DIAGNOSIS — J302 Other seasonal allergic rhinitis: Secondary | ICD-10-CM | POA: Insufficient documentation

## 2022-01-07 DIAGNOSIS — R051 Acute cough: Secondary | ICD-10-CM | POA: Diagnosis present

## 2022-01-07 DIAGNOSIS — R053 Chronic cough: Secondary | ICD-10-CM | POA: Insufficient documentation

## 2022-01-07 DIAGNOSIS — Z1152 Encounter for screening for COVID-19: Secondary | ICD-10-CM | POA: Diagnosis not present

## 2022-01-07 LAB — RESP PANEL BY RT-PCR (RSV, FLU A&B, COVID)  RVPGX2
Influenza A by PCR: NEGATIVE
Influenza B by PCR: NEGATIVE
Resp Syncytial Virus by PCR: NEGATIVE
SARS Coronavirus 2 by RT PCR: NEGATIVE

## 2022-01-07 MED ORDER — BENZONATATE 100 MG PO CAPS: 100.0000 mg | ORAL_CAPSULE | Freq: Three times a day (TID) | ORAL | 0 refills | Status: DC | PRN

## 2022-01-07 NOTE — Discharge Instructions (Addendum)
Your COVID, Flu, and RSV tests are pending.    Take the [facility] as directed for cough.  Take Tylenol as needed for fever or discomfort.  Rest and keep yourself hydrated.    Follow-up with your primary care provider.

## 2022-01-07 NOTE — ED Triage Notes (Signed)
Patient to Urgent Care with wife, complaints of cough, nasal drainage, and sneezing x4 days. Reports he was doing yard work on Tuesday prior to symptoms. Denies any known fevers. Cough is productive at times, at times phlegm is yellow.  Patient HOH.

## 2022-01-07 NOTE — ED Provider Notes (Signed)
Roderic Palau    CSN: [id number] Arrival date & time: 01/07/22  3832      History   Chief Complaint Chief Complaint  Patient presents with   Cough    HPI Nathaniel Thompson is a 86 y.o. male.  Accompanied by his wife, patient presents with 3-day history of sneezing, postnasal drip, cough.  His symptoms started after he was working in his yard.  No fever, shortness of breath, vomiting, diarrhea, or other symptoms.  He takes Allegra daily for allergies.  His medical history includes COPD, hypertension, AAA, chronic heart failure, atrial fibrillation, CKD.    The history is provided by the patient, the spouse and medical records.    Past Medical History:  Diagnosis Date   (HFpEF) heart failure with preserved ejection fraction ([place])    a. 05/2018 Echo: Nl EF, Gr1 DD, sev dil LA, mild MR/TR, mild PAH; b. 05/2019 Echo: EF 55-60%, no rwma, mild LVH. Nl PASP. Mildly dil LA. Triv MR. Mild AoV sclerosis w/o stenosis. Mildly dil Asc AO (30m).   Allergic rhinitis    Barrett's esophagus 10/21/2013   Bone cancer (HCC)    CKD (chronic kidney disease), stage III (HCC)    Colon polyp, hyperplastic    COPD (chronic obstructive pulmonary disease) (HCC)    mild COPD. former smoker   Coronary artery disease    a. 2003 s/p PCI ([facility]); b. 2004 s/p PCI x 2 ([facility]); c. 11/2017 Cath: LM nl, LAD mild dzs, LCX patent stent w/ 30% distal edge restenosis, RCA patent distal stent w/ 30% prox edge stenosis. Nl EF->Med Rx;   Diverticulosis    Fundic gland polyps of stomach, benign    Gastritis    GERD (gastroesophageal reflux disease)    Hypercholesteremia    Hypertension    Prostate cancer ([place]    Prostatism    Reflux esophagitis    Renal stones    Skin cancer    left cheek/lesion excised   Sleep apnea    Tachycardia    a. 11/2017 admit w/ tachycardia/? afib-->no afib noted upon review (amio/OAC d/c'd).   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   Abdominal aortic aneurysm (AAA) ([place] [facility] [place] 12/07/2018   Anemia 12/07/2018   Right hip pain 11/22/2018   Atrial fibrillation, new onset ([place] 11/27/2017   Dizziness 08/12/2017   Nasal erythema 04/10/2017   Carotid artery disease ([facility] 08/06/2016   History of TIA (transient ischemic attack) 07/24/2016   Sleep apnea 07/24/2016   COPD (chronic obstructive pulmonary disease) ([place] 07/24/2016   CKD (chronic kidney disease) stage 3, GFR 30-59 ml/min ([facility] 07/24/2016   Barrett's esophagus 07/24/2016   Hyperglycemia 07/24/2016   Hypercholesterolemia 07/12/2016   Hypertension, essential 07/12/2016   Coronary artery disease involving native heart with angina pectoris ([place] 07/12/2016   Prostate cancer ([place] 11/15/2015   Family history of cancer 11/15/2015   Goals of care, counseling/discussion 06/18/2014   Acute rhinitis 02/12/2014   S/P coronary artery stent  placement 10/02/2013   History of skin cancer 09/02/2013    Past Surgical History:  Procedure Laterality Date   CARDIAC CATHETERIZATION     COLONOSCOPY WITH PROPOFOL N/A 09/13/2015   Procedure: COLONOSCOPY WITH PROPOFOL;  Surgeon: Lollie Sails, MD;  Location: [facility] ENDOSCOPY;  Service: Endoscopy;  Laterality: N/A;   COLONOSCOPY WITH PROPOFOL N/A 05/07/2019   Procedure: COLONOSCOPY WITH PROPOFOL;  Surgeon: Robert Bellow, MD;  Location: [facility] ENDOSCOPY;  Service: Endoscopy;  Laterality: N/A;   CORONARY ANGIOPLASTY     ESOPHAGOGASTRODUODENOSCOPY (EGD) WITH PROPOFOL N/A 09/13/2015   Procedure:  ESOPHAGOGASTRODUODENOSCOPY (EGD) WITH PROPOFOL;  Surgeon: Lollie Sails, MD;  Location: [facility] ENDOSCOPY;  Service: Endoscopy;  Laterality: N/A;   ESOPHAGOGASTRODUODENOSCOPY (EGD) WITH PROPOFOL N/A 12/28/2015   Procedure: ESOPHAGOGASTRODUODENOSCOPY (EGD) WITH PROPOFOL;  Surgeon: Lollie Sails, MD;  Location: [facility] ENDOSCOPY;  Service: Endoscopy;  Laterality: N/A;   ESOPHAGOGASTRODUODENOSCOPY (EGD) WITH PROPOFOL N/A 06/16/2016   Procedure: ESOPHAGOGASTRODUODENOSCOPY (EGD) WITH PROPOFOL;  Surgeon: Lollie Sails, MD;  Location[facility] END[facility]COPY;  Service: Endoscopy;  Laterality: N/A;   ESOPHAGOGASTRODUODENOSCOPY (EGD) WITH PROPOFOL N/A 05/07/2019   Procedure: ESOPHAGOGASTRODUODENOSCOPY (EGD) WITH PROPOFOL;  Surgeon: Robert Bellow, MD;  Location: [facility] ENDOSCOPY;  Service: Endoscopy;  Laterality: N/A;   EYE SURGERY  2013   CATARACT EXTRACTION   GANGLION CYST EXCISION Right 05/18/2017   Procedure: REMOVAL GANGLION CYST ANKLE;  Surgeon: Samara Deist, DPM;  Location: [facility] ORS;  Service: Podiatry;  Laterality: Right;   heart cath stent     LEFT HEART CATH AND CORONARY ANGIOGRAPHY Right 12/03/2017   Procedure: Left Heart Cath and Coronary Angiography with possible coronary intervention;  Surgeon: Dionisio David, MD;  Location: [place] CV LAB;  Service: Cardiovascular;  Laterality: Right;   PACEMAKER IMPLANT N/A 04/04/2021   Procedure: PACEMAKER IMPLANT;  Surgeon: Deboraha Sprang, MD;  Location: [place] CV LAB;  Service: Cardiovascular;  Laterality: N/A;   PROSTATE BIOPSY     RIGHT/LEFT HEART CATH AND CORONARY ANGIOGRAPHY N/A 02/07/2021   Procedure: RIGHT/LEFT HEART CATH AND CORONARY ANGIOGRAPHY;  Surgeon: [name], MD;  Location: [place] CV LAB;  Service: Cardiovascular;  Laterality: N/A;   stents     multiple       Home Medications    Prior to Admission medications   Medication Sig Start Date End Date Taking? Authorizing Provider  benzonatate (TESSALON) 100 MG  capsule Take 1 capsule (100 mg total) by mouth 3 (three) times daily as needed for cough. 01/07/22  Yes Sharion Balloon, NP  amLODipine (NORVASC) 2.5 MG tablet Take 1 tablet (2.5 mg total) by mouth daily. 06/21/21   [name], MD  aspirin EC 81 MG tablet Take 81 mg by mouth daily.    [provider]  Calcium Carb-Cholecalciferol (CALCIUM + D3 PO) Take 1 tablet by mouth daily.    [provider]  cholecalciferol (VITAMIN D) 1000 units tablet Take 1,000 Units by mouth daily.     [provider]  clopidogrel (PLAVIX) 75 MG tablet TAKE 1 TABLET BY MOUTH ONCE DAILY 11/02/21   [name], MD  ferrous sulfate 324 MG TBEC Take 324 mg by mouth daily.    [provider]  furosemide (LASIX) 20 MG tablet TAKE 1 TABLET BY MOUTH ONCE DAILY 10/05/21   [name], MD  isosorbide mononitrate (IMDUR) 30 MG 24 hr tablet TAKE 1 TABLET BY MOUTH ONCE DAILY 04/12/21   Einar Pheasant, MD  Leuprolide Acetate, 3  Month, (ELIGARD) 22.5 MG injection Inject 22.5 mg into the skin every 4 (four) months.    [provider]  losartan (COZAAR) 50 MG tablet TAKE 1 TABLET BY MOUTH ONCE DAILY 11/02/21   [name], MD  lovastatin (MEVACOR) 40 MG tablet TAKE 2 TABLETS BY MOUTH AT BEDTIME 07/18/21   Einar Pheasant, MD  Multiple Vitamin (MULTI-VITAMINS) TABS Take 1 tablet by mouth daily.     [provider]  pantoprazole (PROTONIX) 40 MG tablet Take 40 mg by mouth 2 (two) times daily.     [provider]  tamsulosin (FLOMAX) 0.4 MG CAPS capsule Take 0.4 mg by mouth daily. 09/22/21   [provider]    Family History Family History  Problem Relation Age of Onset   Cancer Mother        gastric and lung   Cancer Father        multiple myeloma   Stroke Father    Cancer Sister        leukemia   Cancer Brother        leukemia   Cancer Brother        kidney   Cancer Daughter        Uterine   Cancer Other        [place] (Sister's Son):  Prostate    Social History Social History   Tobacco Use   Smoking status: Former    Packs/day: 1.00    Years: 35.00    Total pack years: 35.00    Types: Cigarettes    Quit date: 03/06/1986    Years since quitting: 35.8   Smokeless tobacco: Former    Types: Nurse, children's Use: Never used  Substance Use Topics   Alcohol use: No   Drug use: No     Allergies   Atorvastatin   Review of Systems Review of Systems  Constitutional:  Negative for chills and fever.  HENT:  Positive for postnasal drip and sneezing. Negative for ear pain and sore throat.   Respiratory:  Positive for cough. Negative for shortness of breath.   Cardiovascular:  Negative for chest pain and palpitations.  Gastrointestinal:  Negative for diarrhea and vomiting.  Skin:  Negative for rash.  All other systems reviewed and are negative.    Physical Exam Triage Vital Signs ED Triage Vitals [01/07/22 0829]  Enc Vitals Group     BP 130/73     Pulse Rate 79     Resp 20     Temp 97.6 F (36.4 C)     Temp src      SpO2 97 %     Weight      Height      Head Circumference      Peak Flow      Pain Score      Pain Loc      Pain Edu?      Excl. in [place]?    No data found.  Updated Vital Signs BP 130/73   Pulse 79   Temp 97.6 F (36.4 C)   Resp 20   SpO2 97%   Visual Acuity Right Eye Distance:   Left Eye Distance:   Bilateral Distance:    Right Eye Near:   Left Eye Near:    Bilateral Near:     Physical Exam Vitals and nursing note reviewed.  Constitutional:      General: He is not in acute distress.  Appearance: Normal appearance. He is well-developed. He is not ill-appearing.  HENT:     Right Ear: Tympanic membrane normal.     Left Ear: Tympanic membrane normal.     Nose: Nose normal.     Mouth/Throat:     Mouth: Mucous membranes are moist.     Pharynx: Oropharynx is clear.  Cardiovascular:     Rate and Rhythm: Normal rate and regular rhythm.     Heart sounds: Normal  heart sounds.  Pulmonary:     Effort: Pulmonary effort is normal. No respiratory distress.     Breath sounds: Normal breath sounds.  Musculoskeletal:     Cervical back: Neck supple.  Skin:    General: Skin is warm and dry.  Neurological:     Mental Status: He is alert.  Psychiatric:        Mood and Affect: Mood normal.        Behavior: Behavior normal.      UC Treatments / Results  Labs (all labs ordered are listed, but only abnormal results are displayed) Labs Reviewed  RESP PANEL BY RT-PCR (RSV, FLU A&B, COVID)  [id number]    EKG   Radiology No results found.  Procedures Procedures (including critical care time)  Medications Ordered in UC Medications - No data to display  Initial Impression / Assessment and Plan / UC Course  I have reviewed the triage vital signs and the nursing notes.  Pertinent labs & imaging results that were available during my care of the patient were reviewed by me and considered in my medical decision making (see chart for details).    Cough, seasonal allergies, viral URI.  Lungs are clear, no respiratory distress, O2 sat 97% on room air.  COVID, Flu, RSV pending.  Treating cough with Tessalon Perles.  Discussed symptomatic treatment including Tylenol, rest, hydration.  Instructed patient to follow up with his PCP if symptoms are not improving.  He agrees to plan of care.   Final Clinical Impressions(s) / UC Diagnoses   Final diagnoses:  Acute cough  Seasonal allergies  Viral URI     Discharge Instructions      Your COVID, Flu, and RSV tests are pending.    Take the [facility] as directed for cough.  Take Tylenol as needed for fever or discomfort.  Rest and keep yourself hydrated.    Follow-up with your primary care provider.         ED Prescriptions     Medication Sig Dispense Auth. Provider   benzonatate (TESSALON) 100 MG capsule Take 1 capsule (100 mg total) by mouth 3 (three) times daily as needed for cough. 21  capsule Sharion Balloon, NP      PDMP not reviewed this encounter.   Sharion Balloon, NP 01/07/22 [phone]

## 2022-01-09 ENCOUNTER — Other Ambulatory Visit: Payer: Self-pay | Admitting: Cardiovascular Disease

## 2022-01-10 ENCOUNTER — Ambulatory Visit (INDEPENDENT_AMBULATORY_CARE_PROVIDER_SITE_OTHER): Payer: Medicare HMO

## 2022-01-10 DIAGNOSIS — I495 Sick sinus syndrome: Secondary | ICD-10-CM

## 2022-01-10 LAB — CUP PACEART REMOTE DEVICE CHECK
Battery Remaining Longevity: 155 mo
Battery Voltage: 3.1 V
Brady Statistic AP VP Percent: 0.38 %
Brady Statistic AP VS Percent: 90.22 %
Brady Statistic AS VP Percent: 0.17 %
Brady Statistic AS VS Percent: 9.23 %
Brady Statistic RA Percent Paced: 91.23 %
Brady Statistic RV Percent Paced: 0.59 %
Date Time Interrogation Session: 20231107004914
Implantable Lead Connection Status: 753985
Implantable Lead Connection Status: 753985
Implantable Lead Implant Date: 20230130
Implantable Lead Implant Date: 20230130
Implantable Lead Location: 753859
Implantable Lead Location: 753860
Implantable Lead Model: 3830
Implantable Lead Model: 5076
Implantable Pulse Generator Implant Date: 20230130
Lead Channel Impedance Value: 304 Ohm
Lead Channel Impedance Value: 399 Ohm
Lead Channel Impedance Value: 494 Ohm
Lead Channel Impedance Value: 532 Ohm
Lead Channel Pacing Threshold Amplitude: 0.75 V
Lead Channel Pacing Threshold Amplitude: 0.875 V
Lead Channel Pacing Threshold Pulse Width: 0.4 ms
Lead Channel Pacing Threshold Pulse Width: 0.4 ms
Lead Channel Sensing Intrinsic Amplitude: 1.625 mV
Lead Channel Sensing Intrinsic Amplitude: 1.625 mV
Lead Channel Sensing Intrinsic Amplitude: 2.375 mV
Lead Channel Sensing Intrinsic Amplitude: 2.375 mV
Lead Channel Setting Pacing Amplitude: 1.75 V
Lead Channel Setting Pacing Amplitude: 2 V
Lead Channel Setting Pacing Pulse Width: 0.4 ms
Lead Channel Setting Sensing Sensitivity: 0.9 mV
Zone Setting Status: 755011

## 2022-01-13 ENCOUNTER — Encounter: Payer: Self-pay | Admitting: Cardiovascular Disease

## 2022-01-13 ENCOUNTER — Ambulatory Visit: Payer: Medicare HMO | Attending: Cardiovascular Disease | Admitting: Cardiovascular Disease

## 2022-01-13 VITALS — BP 136/78 | HR 78 | Ht 70.0 in | Wt 221.1 lb

## 2022-01-13 DIAGNOSIS — I5032 Chronic diastolic (congestive) heart failure: Secondary | ICD-10-CM | POA: Diagnosis not present

## 2022-01-13 DIAGNOSIS — I2510 Atherosclerotic heart disease of native coronary artery without angina pectoris: Secondary | ICD-10-CM

## 2022-01-13 DIAGNOSIS — I10 Essential (primary) hypertension: Secondary | ICD-10-CM | POA: Diagnosis not present

## 2022-01-13 DIAGNOSIS — R001 Bradycardia, unspecified: Secondary | ICD-10-CM

## 2022-01-13 DIAGNOSIS — E785 Hyperlipidemia, unspecified: Secondary | ICD-10-CM

## 2022-01-13 NOTE — Progress Notes (Signed)
Cardiology Office Note   Date:  01/13/2022   ID:  Nathaniel Thompson, DOB 01-18-1936, MRN [id number]  PCP:  Einar Pheasant, MD  Cardiologist:   Kathlyn Sacramento, MD   Chief Complaint  Patient presents with   Other    6 month f/u no complaints today. Meds reviewed verbally with pt.        History of Present Illness: Nathaniel Thompson is a 86 y.o. male who presents for a follow-up visit regarding coronary artery disease.   He quit smoking in the 80s.  He reports having TIA in the past.  Other medical problems include metastatic prostate cancer, small abdominal aortic aneurysm followed by vascular surgery, chronic kidney disease, hyperlipidemia and essential hypertension.  He has known history of coronary artery disease status post stenting of the left circumflex and RCA.  He had initial PCI done in 2003 at [facility].  He had repeat PCI x2 at [facility] in 2004.   He was hospitalized in September 2019 with questionable tachycardia and atrial fibrillation.  Initially was placed on amiodarone and anticoagulation but all his EKGs revealed sinus rhythm at that time and thus both amiodarone and Eliquis were discontinued.  No recurrent arrhythmia since then.   Echocardiogram in March 2020 showed normal LV systolic function with grade 1 diastolic dysfunction, severely dilated left atrium, mild mitral and tricuspid regurgitation and mild pulmonary hypertension.  He had worsening chest pain and shortness of breath in December 2022.  Cardiac catheterization showed patent stents in the left circumflex and right coronary artery with no obstructive coronary artery disease.  Right heart catheterization showed mildly elevated filling pressures, mild pulmonary hypertension and normal cardiac output.  The patient was noted to have intermittent bradycardia during cardiac catheterization with heart rate going to the low 40s.    He underwent outpatient monitor which showed frequent episodes of SVT.  He was referred  to EP and underwent pacemaker placement in January 2023.  In addition, he was placed on furosemide for diastolic heart failure.  He has been doing well with no chest pain or shortness of breath.  No syncope or presyncope.  He continues to have intermittent episodes of lightheadedness when his blood pressure is on the low side.   Past Medical History:  Diagnosis Date   (HFpEF) heart failure with preserved ejection fraction ([place])    a. 05/2018 Echo: Nl EF, Gr1 DD, sev dil LA, mild MR/TR, mild PAH; b. 05/2019 Echo: EF 55-60%, no rwma, mild LVH. Nl PASP. Mildly dil LA. Triv MR. Mild AoV sclerosis w/o stenosis. Mildly dil Asc AO (42m).   Allergic rhinitis    Barrett's esophagus 10/21/2013   Bone cancer (HCC)    CKD (chronic kidney disease), stage III (HCC)    Colon polyp, hyperplastic    COPD (chronic obstructive pulmonary disease) (HCC)    mild COPD. former smoker   Coronary artery disease    a. 2003 s/p PCI ([facility]); b. 2004 s/p PCI x 2 ([facility]); c. 11/2017 Cath: LM nl, LAD mild dzs, LCX patent stent w/ 30% distal edge restenosis, RCA patent distal stent w/ 30% prox edge stenosis. Nl EF->Med Rx;   Diverticulosis    Fundic gland polyps of stomach, benign    Gastritis    GERD (gastroesophageal reflux disease)    Hypercholesteremia    Hypertension    Prostate cancer ([place]    Prostatism    Reflux esophagitis    Renal stones    Skin cancer  left cheek/lesion excised   Sleep apnea    Tachycardia    a. 11/2017 admit w/ tachycardia/? afib-->no afib noted upon review (amio/OAC d/c'd).   TIA (transient ischemic attack)     Past Surgical History:  Procedure Laterality Date   CARDIAC CATHETERIZATION     COLONOSCOPY WITH PROPOFOL N/A 09/13/2015   Procedure: COLONOSCOPY WITH PROPOFOL;  Surgeon: Lollie Sails, MD;  [facility] ENDOSCOPY;  Service: Endoscopy;  Laterality: N/A;   COLONOSCOPY WITH PROPOFOL N/A 05/07/2019   Procedure: COLONOSCOPY WITH PROPOFOL;  Surgeon: Robert Bellow, MD;   Location: [facility] ENDOSCOPY;  Service: Endoscopy;  Laterality: N/A;   CORONARY ANGIOPLASTY     ESOPHAGOGASTRODUODENOSCOPY (EGD) WITH PROPOFOL N/A 09/13/2015   Procedure: ESOPHAGOGASTRODUODENOSCOPY (EGD) WITH PROPOFOL;  Surgeon: Lollie Sails, MD;  Location: [facility] ENDOSCOPY;  Service: Endoscopy;  Laterality: N/A;   ESOPHAGOGASTRODUODENOSCOPY (EGD) WITH PROPOFOL N/A 12/28/2015   Procedure: ESOPHAGOGASTRODUODENOSCOPY (EGD) WITH PROPOFOL;  Surgeon: Lollie Sails, MD;  Location[facility] ENDOSCOPY;  Service: Endoscopy;  Laterality: N/A;   ESOPHAGOGASTRODUODENOSCOPY (EGD) WITH PROPOFOL N/A 06/16/2016   Procedure: ESOPHAGOGASTRODUODENOSCOPY (EGD) WITH PROPOFOL;  Surgeon: Lollie Sails, MD;  Location: [facility] ENDOSCOPY;  Service: Endoscopy;  Laterality: N/A;   ESOPHAGOGASTRODUODENOSCOPY (EGD) WITH PROPOFOL N/A 05/07/2019   Procedure: ESOPHAGOGASTRODUODENOSCOPY (EGD) WITH PROPOFOL;  Surgeon: Robert Bellow, MD;  Location: [facility] ENDOSCOPY;  Service: Endoscopy;  Laterality: N/A;   EYE SURGERY  2013   CATARACT EXTRACTION   GANGLION CYST EXCISION Right 05/18/2017   Procedure: REMOVAL GANGLION CYST ANKLE;  Surgeon: Samara Deist, DPM;  Location: [facility] ORS;  Service: Podiatry;  Laterality: Right;   heart cath stent     LEFT HEART CATH AND CORONARY ANGIOGRAPHY Right 12/03/2017   Procedure: Left Heart Cath and Coronary Angiography with possible coronary intervention;  Surgeon: Dionisio David, MD;  Location: [place] CV LAB;  Service: Cardiovascular;  Laterality: Right;   PACEMAKER IMPLANT N/A 04/04/2021   Procedure: PACEMAKER IMPLANT;  Surgeon: Deboraha Sprang, MD;  Location: [place] CV LAB;  Service: Cardiovascular;  Laterality: N/A;   PROSTATE BIOPSY     RIGHT/LEFT HEART CATH AND CORONARY ANGIOGRAPHY N/A 02/07/2021   Procedure: RIGHT/LEFT HEART CATH AND CORONARY ANGIOGRAPHY;  Surgeon: [name], MD;  Location: [place] CV LAB;  Service: Cardiovascular;  Laterality: N/A;   stents      multiple     Current Outpatient Medications  Medication Sig Dispense Refill   amLODipine (NORVASC) 2.5 MG tablet TAKE 1 TABLET BY MOUTH ONCE DAILY *REDUCED DOSE* 90 tablet 0   Calcium Carb-Cholecalciferol (CALCIUM + D3 PO) Take 1 tablet by mouth daily.     cholecalciferol (VITAMIN D) 1000 units tablet Take 1,000 Units by mouth daily.      clopidogrel (PLAVIX) 75 MG tablet TAKE 1 TABLET BY MOUTH ONCE DAILY 90 tablet 0   ferrous sulfate 324 MG TBEC Take 324 mg by mouth daily.     furosemide (LASIX) 20 MG tablet TAKE 1 TABLET BY MOUTH ONCE DAILY 90 tablet 2   isosorbide mononitrate (IMDUR) 30 MG 24 hr tablet TAKE 1 TABLET BY MOUTH ONCE DAILY 90 tablet 2   Leuprolide Acetate, 3 Month, (ELIGARD) 22.5 MG injection Inject 22.5 mg into the skin every 4 (four) months.     losartan (COZAAR) 50 MG tablet TAKE 1 TABLET BY MOUTH ONCE DAILY 90 tablet 1   lovastatin (MEVACOR) 40 MG tablet TAKE 2 TABLETS BY MOUTH AT BEDTIME 180 tablet 1   Multiple Vitamin (MULTI-VITAMINS)  TABS Take 1 tablet by mouth daily.      pantoprazole (PROTONIX) 40 MG tablet Take 40 mg by mouth 2 (two) times daily.      No current facility-administered medications for this visit.    Allergies:   Atorvastatin    Social History:  The patient  reports that he quit smoking about 35 years ago. His smoking use included cigarettes. He has a 35.00 pack-year smoking history. He has quit using smokeless tobacco.  His smokeless tobacco use included chew. He reports that he does not drink alcohol and does not use drugs.   Family History:  The patient's family history includes Cancer in his brother, brother, daughter, father, mother, sister, and another family member; Stroke in his father.    ROS:  Please see the history of present illness.   Otherwise, review of systems are positive for none.   All other systems are reviewed and negative.    PHYSICAL EXAM: VS:  BP 136/78 (BP Location: Left Arm, Patient Position: Sitting, Cuff Size:  Normal)   Pulse 78   Ht '5\' 10"'$  (1.778 m)   Wt 221 lb 2 oz (100.3 kg)   SpO2 93%   BMI 31.73 kg/m  , BMI Body mass index is 31.73 kg/m. GEN: Well nourished, well developed, in no acute distress  HEENT: normal  Neck: no JVD, carotid bruits, or masses Cardiac: RRR; no murmurs, rubs, or gallops,no edema  Respiratory:  clear to auscultation bilaterally, normal work of breathing GI: soft, nontender, nondistended, + BS MS: no deformity or atrophy  Skin: warm and dry, no rash Neuro:  Strength and sensation are intact Psych: euthymic mood, full affect Radial pulses normal.  EKG:  EKG is ordered today. The ekg ordered today demonstrates atrial paced rhythm with no significant ST or T wave changes.   Recent Labs: 03/15/2021: B Natriuretic Peptide 114.6 09/21/2021: TSH 2.19 11/21/2021: ALT 16; BUN 25; Creatinine, Ser 1.48; Hemoglobin 13.4; Platelets 196; Potassium 4.1; Sodium 138    Lipid Panel    Component Value Date/Time   CHOL 117 09/21/2021 0734   CHOL 103 09/16/2013 0438   TRIG 99.0 09/21/2021 0734   TRIG 95 09/16/2013 0438   HDL 32.70 (L) 09/21/2021 0734   HDL 30 (L) 09/16/2013 0438   CHOLHDL 4 09/21/2021 0734   VLDL 19.8 09/21/2021 0734   VLDL 19 09/16/2013 0438   LDLCALC 64 09/21/2021 0734   LDLCALC 54 09/16/2013 0438      Wt Readings from Last 3 Encounters:  01/13/22 221 lb 2 oz (100.3 kg)  11/21/21 216 lb (98 kg)  10/04/21 220 lb (99.8 kg)          04/15/2019    2:58 PM  PAD Screen  Previous PAD dx? No  Previous surgical procedure? No  Pain with walking? No  Feet/toe relief with dangling? No  Painful, non-healing ulcers? No  Extremities discolored? No      ASSESSMENT AND PLAN:  1.  Coronary artery disease involving native coronary arteries without angina: Cardiac catheterization in December showed patent stents with no obstructive disease.  I discontinued aspirin today.  Continue clopidogrel 75 mg daily.  2.  Chronic diastolic heart failure: His  filling pressures were mildly elevated and his symptoms improved significantly with small dose furosemide.  3.  Essential hypertension: Blood pressure is controlled on current medications.  4.  Hyperlipidemia:  History of intolerance to potent statins.  He tolerates lovastatin 40 mg daily.  I reviewed most recent lipid profile  which showed an LDL of 64.  5.  Small abdominal aortic aneurysm: Less than 4 cm.  Followed by vascular surgery.  This has been stable.  In addition, he has mild nonobstructive carotid disease.  6.  Symptomatic bradycardia: Status post permanent pacemaker placement.  Significant improvement in dizziness.    Disposition: Follow-up in 9 months.   Signed,  Kathlyn Sacramento, MD  01/13/2022 9:16 AM    [place]

## 2022-01-13 NOTE — Patient Instructions (Signed)
Medication Instructions:  STOP the Aspirin  *If you need a refill on your cardiac medications before your next appointment, please call your pharmacy*   Lab Work: None ordered If you have labs (blood work) drawn today and your tests are completely normal, you will receive your results only by: [place] (if you have MyChart) OR A paper copy in the mail If you have any lab test that is abnormal or we need to change your treatment, we will call you to review the results.   Testing/Procedures: None ordered   Follow-Up: At [facility], you and your health needs are our priority.  As part of our continuing mission to provide you with exceptional heart care, we have created designated Provider Care Teams.  These Care Teams include your primary Cardiologist (physician) and Advanced Practice Providers (APPs -  Physician Assistants and Nurse Practitioners) who all work together to provide you with the care you need, when you need it.  We recommend signing up for the patient portal called "MyChart".  Sign up information is provided on this After Visit Summary.  MyChart is used to connect with patients for Virtual Visits (Telemedicine).  Patients are able to view lab/test results, encounter notes, upcoming appointments, etc.  Non-urgent messages can be sent to your provider as well.   To learn more about what you can do with MyChart, go to [email or web address].    Your next appointment:   9 month(s)  The format for your next appointment:   In Person  Provider:   Kathlyn Sacramento, MD      Important Information About Sugar

## 2022-01-21 ENCOUNTER — Other Ambulatory Visit: Payer: Self-pay | Admitting: Internal Medicine

## 2022-01-23 ENCOUNTER — Ambulatory Visit (INDEPENDENT_AMBULATORY_CARE_PROVIDER_SITE_OTHER): Payer: Medicare HMO

## 2022-01-23 ENCOUNTER — Other Ambulatory Visit (INDEPENDENT_AMBULATORY_CARE_PROVIDER_SITE_OTHER): Payer: Self-pay | Admitting: Nurse Practitioner

## 2022-01-23 ENCOUNTER — Ambulatory Visit (INDEPENDENT_AMBULATORY_CARE_PROVIDER_SITE_OTHER): Payer: Medicare HMO | Admitting: Nurse Practitioner

## 2022-01-23 ENCOUNTER — Encounter (INDEPENDENT_AMBULATORY_CARE_PROVIDER_SITE_OTHER): Payer: Self-pay | Admitting: Nurse Practitioner

## 2022-01-23 VITALS — BP 164/93 | HR 89 | Resp 18 | Ht 70.0 in | Wt 221.6 lb

## 2022-01-23 DIAGNOSIS — I10 Essential (primary) hypertension: Secondary | ICD-10-CM | POA: Diagnosis not present

## 2022-01-23 DIAGNOSIS — I7143 Infrarenal abdominal aortic aneurysm, without rupture: Secondary | ICD-10-CM

## 2022-01-23 DIAGNOSIS — I714 Abdominal aortic aneurysm, without rupture, unspecified: Secondary | ICD-10-CM | POA: Diagnosis not present

## 2022-01-23 DIAGNOSIS — I6523 Occlusion and stenosis of bilateral carotid arteries: Secondary | ICD-10-CM | POA: Diagnosis not present

## 2022-01-23 DIAGNOSIS — I779 Disorder of arteries and arterioles, unspecified: Secondary | ICD-10-CM

## 2022-01-25 ENCOUNTER — Other Ambulatory Visit (INDEPENDENT_AMBULATORY_CARE_PROVIDER_SITE_OTHER): Payer: Medicare HMO

## 2022-01-25 DIAGNOSIS — E7800 Pure hypercholesterolemia, unspecified: Secondary | ICD-10-CM

## 2022-01-25 DIAGNOSIS — I10 Essential (primary) hypertension: Secondary | ICD-10-CM | POA: Diagnosis not present

## 2022-01-25 DIAGNOSIS — R739 Hyperglycemia, unspecified: Secondary | ICD-10-CM

## 2022-01-25 LAB — BASIC METABOLIC PANEL
BUN: 23 mg/dL (ref 6–23)
CO2: 28 mEq/L (ref 19–32)
Calcium: 9.1 mg/dL (ref 8.4–10.5)
Chloride: 106 mEq/L (ref 96–112)
Creatinine, Ser: 1.24 mg/dL (ref 0.40–1.50)
GFR: 53.53 mL/min — ABNORMAL LOW (ref >60.00)
Glucose, Bld: 101 mg/dL — ABNORMAL HIGH (ref 70–99)
Potassium: 3.8 mEq/L (ref 3.5–5.1)
Sodium: 140 mEq/L (ref 135–145)

## 2022-01-25 LAB — HEPATIC FUNCTION PANEL
ALT: 13 U/L (ref 0–53)
AST: 17 U/L (ref 0–37)
Albumin: 4.1 g/dL (ref 3.5–5.2)
Alkaline Phosphatase: 52 U/L (ref 39–117)
Bilirubin, Direct: 0.1 mg/dL (ref 0.0–0.3)
Total Bilirubin: 0.5 mg/dL (ref 0.2–1.2)
Total Protein: 6.3 g/dL (ref 6.0–8.3)

## 2022-01-25 LAB — LIPID PANEL
Cholesterol: 140 mg/dL (ref 0–200)
HDL: 38.3 mg/dL — ABNORMAL LOW (ref >39.00)
LDL Cholesterol: 81 mg/dL (ref 0–99)
NonHDL: 101.66
Total CHOL/HDL Ratio: 4
Triglycerides: 105 mg/dL (ref 0.0–149.0)
VLDL: 21 mg/dL (ref 0.0–40.0)

## 2022-01-25 LAB — TSH: TSH: 2.15 u[IU]/mL (ref 0.35–5.50)

## 2022-01-25 LAB — HEMOGLOBIN A1C: Hgb A1c MFr Bld: 5.9 % (ref 4.6–6.5)

## 2022-01-27 ENCOUNTER — Other Ambulatory Visit: Payer: Medicare HMO

## 2022-01-29 ENCOUNTER — Encounter (INDEPENDENT_AMBULATORY_CARE_PROVIDER_SITE_OTHER): Payer: Self-pay | Admitting: Nurse Practitioner

## 2022-01-29 NOTE — Progress Notes (Signed)
Subjective:    Patient ID: Nathaniel Thompson, male    DOB: 1935-07-08, 86 y.o.   MRN: [id number] No chief complaint on file.   Nathaniel Thompson is an 86 year old male that presents to the office for evaluation of an abdominal aortic aneurysm. The aneurysm was found incidentally by plain films for low back pain. Patient denies abdominal pain or unusual back pain, no other abdominal complaints.  He does have a bony met from his prostate cancer at L3.  No history of an acute onset of painful blue discoloration of the toes.      No family history of AAA.    Patient denies amaurosis fugax or TIA symptoms. There is no history of claudication or rest pain symptoms of the lower extremities.  He does have pain in the right leg when he walks but it radiates from posterior to laterally down the leg and occurs with laying down in bed tooThe patient denies angina or shortness of breath.   Duplex ultrasound of the abdominal aorta obtained today demonstrates the infrarenal abdominal aorta is 2.8 cm.  The previous aortic duplex ultrasound dated 01/21/2021 demonstrated a infrarenal abdominal aortic aneurysm with a maximal diameter of 2.7 cm   Carotid duplex is 1 to 39% bilaterally.  There is no significant change on today's study compared to previous.         Review of Systems  All other systems reviewed and are negative.      Objective:   Physical Exam Vitals reviewed.  HENT:     Head: Normocephalic.  Cardiovascular:     Rate and Rhythm: Normal rate.     Pulses: Normal pulses.  Pulmonary:     Effort: Pulmonary effort is normal.  Skin:    General: Skin is warm and dry.  Neurological:     Mental Status: He is alert and oriented to person, place, and time.  Psychiatric:        Mood and Affect: Mood normal.        Behavior: Behavior normal.        Thought Content: Thought content normal.        Judgment: Judgment normal.     BP (!) 164/93 (BP Location: Right Arm)   Pulse 89   Resp 18   Ht  5' 10" (1.778 m)   Wt 221 lb 9.6 oz (100.5 kg)   BMI 31.80 kg/m   Past Medical History:  Diagnosis Date   (HFpEF) heart failure with preserved ejection fraction ([place])    a. 05/2018 Echo: Nl EF, Gr1 DD, sev dil LA, mild MR/TR, mild PAH; b. 05/2019 Echo: EF 55-60%, no rwma, mild LVH. Nl PASP. Mildly dil LA. Triv MR. Mild AoV sclerosis w/o stenosis. Mildly dil Asc AO (25m).   Allergic rhinitis    Barrett's esophagus 10/21/2013   Bone cancer (HCC)    CKD (chronic kidney disease), stage III (HCC)    Colon polyp, hyperplastic    COPD (chronic obstructive pulmonary disease) (HCC)    mild COPD. former smoker   Coronary artery disease    a. 2003 s/p PCI ([facility]); b. 2004 s/p PCI x 2 ([facility]); c. 11/2017 Cath: LM nl, LAD mild dzs, LCX patent stent w/ 30% distal edge restenosis, RCA patent distal stent w/ 30% prox edge stenosis. Nl EF->Med Rx;   Diverticulosis    Fundic gland polyps of stomach, benign    Gastritis    GERD (gastroesophageal reflux disease)    Hypercholesteremia    Hypertension  Prostate cancer ([place])    Prostatism    Reflux esophagitis    Renal stones    Skin cancer    left cheek/lesion excised   Sleep apnea    Tachycardia    a. 11/2017 admit w/ tachycardia/? afib-->no afib noted upon review (amio/OAC d/c'd).   TIA (transient ischemic attack)     Social History   Socioeconomic History   Marital status: Married    Spouse name: Not on file   Number of children: Not on file   Years of education: Not on file   Highest education level: Not on file  Occupational History   Not on file  Tobacco Use   Smoking status: Former    Packs/day: 1.00    Years: 35.00    Total pack years: 35.00    Types: Cigarettes    Quit date: 03/06/1986    Years since quitting: 35.9   Smokeless tobacco: Former    Types: Nurse, children's Use: Never used  Substance and Sexual Activity   Alcohol use: No   Drug use: No   Sexual activity: Not Currently  Other Topics Concern   Not on  file  Social History Narrative   Not on file   Social Determinants of Health   Financial Resource Strain: Low Risk  (10/04/2021)   Overall Financial Resource Strain (CARDIA)    Difficulty of Paying Living Expenses: Not hard at all  Food Insecurity: No Food Insecurity (10/04/2021)   Hunger Vital Sign    Worried About Running Out of Food in the Last Year: Never true    [place] in the Last Year: Never true  Transportation Needs: No Transportation Needs (10/04/2021)   PRAPARE - Hydrologist (Medical): No    Lack of Transportation (Non-Medical): No  Physical Activity: Insufficiently Active (10/04/2021)   Exercise Vital Sign    Days of Exercise per Week: 4 days    Minutes of Exercise per Session: 30 min  Stress: No Stress Concern Present (10/04/2021)   [place]    Feeling of Stress : Not at all  Social Connections: Unknown (10/04/2021)   Social Connection and Isolation Panel [NHANES]    Frequency of Communication with Friends and Family: Not on file    Frequency of Social Gatherings with Friends and Family: Not on file    Attends Religious Services: Not on file    Active Member of Clubs or Organizations: Not on file    Attends Archivist Meetings: Not on file    Marital Status: Married  Intimate Partner Violence: Not At Risk (10/04/2021)   Humiliation, Afraid, Rape, and Kick questionnaire    Fear of Current or Ex-Partner: No    Emotionally Abused: No    Physically Abused: No    Sexually Abused: No    Past Surgical History:  Procedure Laterality Date   CARDIAC CATHETERIZATION     COLONOSCOPY WITH PROPOFOL N/A 09/13/2015   Procedure: COLONOSCOPY WITH PROPOFOL;  Surgeon: Lollie Sails, MD;  [facility] ENDOSCOPY;  Service: Endoscopy;  Laterality: N/A;   COLONOSCOPY WITH PROPOFOL N/A 05/07/2019   Procedure: COLONOSCOPY WITH PROPOFOL;  Surgeon: Robert Bellow, MD;  Location:  [facility] ENDOSCOPY;  Service: Endoscopy;  Laterality: N/A;   CORONARY ANGIOPLASTY     ESOPHAGOGASTRODUODENOSCOPY (EGD) WITH PROPOFOL N/A 09/13/2015   Procedure: ESOPHAGOGASTRODUODENOSCOPY (EGD) WITH PROPOFOL;  Surgeon: Lollie Sails, MD;  Location:  [place] ENDOSCOPY;  Service: Endoscopy;  Laterality: N/A;   ESOPHAGOGASTRODUODENOSCOPY (EGD) WITH PROPOFOL N/A 12/28/2015   Procedure: ESOPHAGOGASTRODUODENOSCOPY (EGD) WITH PROPOFOL;  Surgeon: Lollie Sails, MD;  Location: [facility] ENDOSCOPY;  Service: Endoscopy;  Laterality: N/A;   ESOPHAGOGASTRODUODENOSCOPY (EGD) WITH PROPOFOL N/A 06/16/2016   Procedure: ESOPHAGOGASTRODUODENOSCOPY (EGD) WITH PROPOFOL;  Surgeon: Lollie Sails, MD;  Location: [facility] ENDOSCOPY;  Service: Endoscopy;  Laterality: N/A;   ESOPHAGOGASTRODUODENOSCOPY (EGD) WITH PROPOFOL N/A 05/07/2019   Procedure: ESOPHAGOGASTRODUODENOSCOPY (EGD) WITH PROPOFOL;  Surgeon: Robert Bellow, MD;  Location: [facility] ENDOSCOPY;  Service: Endoscopy;  Laterality: N/A;   EYE SURGERY  2013   CATARACT EXTRACTION   GANGLION CYST EXCISION Right 05/18/2017   Procedure: REMOVAL GANGLION CYST ANKLE;  Surgeon: Samara Deist, DPM;  Location: [facility] ORS;  Service: Podiatry;  Laterality: Right;   heart cath stent     LEFT HEART CATH AND CORONARY ANGIOGRAPHY Right 12/03/2017   Procedure: Left Heart Cath and Coronary Angiography with possible coronary intervention;  Surgeon: Dionisio David, MD;  Location: [place] CV LAB;  Service: Cardiovascular;  Laterality: Right;   PACEMAKER IMPLANT N/A 04/04/2021   Procedure: PACEMAKER IMPLANT;  Surgeon: Deboraha Sprang, MD;  Location: [place] CV LAB;  Service: Cardiovascular;  Laterality: N/A;   PROSTATE BIOPSY     RIGHT/LEFT HEART CATH AND CORONARY ANGIOGRAPHY N/A 02/07/2021   Procedure: RIGHT/LEFT HEART CATH AND CORONARY ANGIOGRAPHY;  Surgeon: [name], MD;  Location: [place] CV LAB;  Service: Cardiovascular;  Laterality: N/A;   stents     multiple     Family History  Problem Relation Age of Onset   Cancer Mother        gastric and lung   Cancer Father        multiple myeloma   Stroke Father    Cancer Sister        leukemia   Cancer Brother        leukemia   Cancer Brother        kidney   Cancer Daughter        Uterine   Cancer Other        Nephes (Sister's Son): Prostate    Allergies  Allergen Reactions   Atorvastatin Other (See Comments)    Achy joints       Latest Ref Rng & Units 11/21/2021   12:58 PM 07/20/2021   12:27 PM 03/22/2021   12:20 PM  CBC  WBC 4.0 - 10.5 K/uL 7.5  5.3  5.1   Hemoglobin 13.0 - 17.0 g/dL 13.4  13.0  13.5   Hematocrit 39.0 - 52.0 % 39.3  37.7  39.5   Platelets 150 - 400 K/uL 196  184  196       CMP     Component Value Date/Time   NA 140 01/25/2022 0756   NA 141 02/04/2021 0810   NA 140 09/16/2013 0438   K 3.8 01/25/2022 0756   K 4.2 09/16/2013 0438   CL 106 01/25/2022 0756   CL 109 (H) 09/16/2013 0438   CO2 28 01/25/2022 0756   CO2 24 09/16/2013 0438   GLUCOSE 101 (H) 01/25/2022 0756   GLUCOSE 101 (H) 09/16/2013 0438   BUN 23 01/25/2022 0756   BUN 18 02/04/2021 0810   BUN 20 (H) 09/16/2013 0438   CREATININE 1.24 01/25/2022 0756   CREATININE 1.28 09/16/2013 0438   CALCIUM 9.1 01/25/2022 0756   CALCIUM 8.3 (L) 09/16/2013 0438   PROT 6.3 01/25/2022  0756   PROT 6.7 09/15/2013 0755   ALBUMIN 4.1 01/25/2022 0756   ALBUMIN 3.5 09/15/2013 0755   AST 17 01/25/2022 0756   AST 37 09/15/2013 0755   ALT 13 01/25/2022 0756   ALT 28 09/15/2013 0755   ALKPHOS 52 01/25/2022 0756   ALKPHOS 44 (L) 09/15/2013 0755   BILITOT 0.5 01/25/2022 0756   BILITOT 0.5 09/15/2013 0755   GFRNONAA 47 (L) 11/21/2021 1258   GFRNONAA 54 (L) 09/16/2013 0438   GFRAA 59 (L) 10/07/2019 1305   GFRAA >60 09/16/2013 0438     No results found.     Assessment & Plan:   1. Carotid artery disease, unspecified laterality, unspecified type ([place]) Recommend:  Given the patient's asymptomatic  subcritical stenosis no further invasive testing or surgery at this time.  Duplex ultrasound shows <40% stenosis bilaterally.  Continue antiplatelet therapy as prescribed Continue management of CAD, HTN and Hyperlipidemia Healthy heart diet,  encouraged exercise at least 4 times per week Follow up in 12 months with duplex ultrasound and physical exam   2. Abdominal aortic aneurysm (AAA) without rupture, unspecified part ([place]) Recommend: No surgery or intervention is indicated at this time.  The patient has an asymptomatic abdominal aortic aneurysm that is less than 4 cm in maximal diameter.    I have reviewed the natural history of abdominal aortic aneurysm and the small risk of rupture for aneurysm less than 5 cm in size.  However, as these small aneurysms tend to enlarge over time, continued surveillance with ultrasound or CT scan is mandatory.   I have also discussed optimizing medical management with hypertension and lipid control and the importance of abstinence from tobacco.  The patient is also encouraged to exercise a minimum of 30 minutes 4 times a week.   Should the patient develop new onset abdominal or back pain or signs of peripheral embolization they are instructed to seek medical attention immediately and to alert the physician providing care that they have an aneurysm.   The patient voices their understanding.  The patient will return in 12 months with an aortic duplex.  3. Hypertension, essential Continue antihypertensive medications as already ordered, these medications have been reviewed and there are no changes at this time.   Current Outpatient Medications on File Prior to Visit  Medication Sig Dispense Refill   amLODipine (NORVASC) 2.5 MG tablet TAKE 1 TABLET BY MOUTH ONCE DAILY *REDUCED DOSE* 90 tablet 0   azelastine (ASTELIN) 0.1 % nasal spray      Calcium Carb-Cholecalciferol (CALCIUM + D3 PO) Take 1 tablet by mouth daily.     cholecalciferol (VITAMIN D)  1000 units tablet Take 1,000 Units by mouth daily.      clopidogrel (PLAVIX) 75 MG tablet TAKE 1 TABLET BY MOUTH ONCE DAILY 90 tablet 0   ferrous sulfate 324 MG TBEC Take 324 mg by mouth daily.     furosemide (LASIX) 20 MG tablet TAKE 1 TABLET BY MOUTH ONCE DAILY 90 tablet 2   isosorbide mononitrate (IMDUR) 30 MG 24 hr tablet TAKE 1 TABLET BY MOUTH ONCE DAILY 90 tablet 2   Leuprolide Acetate, 3 Month, (ELIGARD) 22.5 MG injection Inject 22.5 mg into the skin every 4 (four) months.     losartan (COZAAR) 50 MG tablet TAKE 1 TABLET BY MOUTH ONCE DAILY 90 tablet 1   lovastatin (MEVACOR) 40 MG tablet TAKE 2 TABLETS BY MOUTH AT BEDTIME 180 tablet 1   Multiple Vitamin (MULTI-VITAMINS) TABS Take 1 tablet by mouth  daily.      pantoprazole (PROTONIX) 40 MG tablet Take 40 mg by mouth 2 (two) times daily.      No current facility-administered medications on file prior to visit.    There are no Patient Instructions on file for this visit. No follow-ups on file.   Kris Hartmann, NP

## 2022-01-30 ENCOUNTER — Encounter: Payer: Self-pay | Admitting: Internal Medicine

## 2022-01-30 ENCOUNTER — Other Ambulatory Visit: Payer: Self-pay | Admitting: Cardiovascular Disease

## 2022-01-30 ENCOUNTER — Ambulatory Visit (INDEPENDENT_AMBULATORY_CARE_PROVIDER_SITE_OTHER): Payer: Medicare HMO | Admitting: Internal Medicine

## 2022-01-30 DIAGNOSIS — I714 Abdominal aortic aneurysm, without rupture, unspecified: Secondary | ICD-10-CM

## 2022-01-30 NOTE — Progress Notes (Deleted)
Patient ID: Nathaniel Thompson, male   DOB: 04-Dec-1935, 86 y.o.   MRN: [id number]   Subjective:    Patient ID: Nathaniel Thompson, male    DOB: [birth year], 86 y.o.   MRN: [id number]   Patient here for No chief complaint on file.  [name]   HPI Here to follow up regarding CHF, CKD, hypertension and hypercholesterolemia.  Was just seen yesterday - acute care - ankle injury.  Injured - stepping down - deer stand. Per note, fractured calcaneus.  Placed in boot.   Just evaluated 01/23/22 - [facility] - aortic ultrasound - AAA - 2.8cm..  no significant change on carotid ultrasound - 1-39% bilaterally. Recommended f/u 12 months. Also saw Dr Fletcher Anon 01/13/22 - stable.  Recommended continue plavix.  Discontinued aspirin. Cardiac cath in 02/2021 - patent stents with no obstructive disease.  Doing well s/p pacemaker.    Past Medical History:  Diagnosis Date   (HFpEF) heart failure with preserved ejection fraction ([place])    a. 05/2018 Echo: Nl EF, Gr1 DD, sev dil LA, mild MR/TR, mild PAH; b. 05/2019 Echo: EF 55-60%, no rwma, mild LVH. Nl PASP. Mildly dil LA. Triv MR. Mild AoV sclerosis w/o stenosis. Mildly dil Asc AO (3m).   Allergic rhinitis    Barrett's esophagus 10/21/2013   Bone cancer (HCC)    CKD (chronic kidney disease), stage III (HCC)    Colon polyp, hyperplastic    COPD (chronic obstructive pulmonary disease) (HCC)    mild COPD. former smoker   Coronary artery disease    a. 2003 s/p PCI ([facility]); b. 2004 s/p PCI x 2 ([facility]); c. 11/2017 Cath: LM nl, LAD mild dzs, LCX patent stent w/ 30% distal edge restenosis, RCA patent distal stent w/ 30% prox edge stenosis. Nl EF->Med Rx;   Diverticulosis    Fundic gland polyps of stomach, benign    Gastritis    GERD (gastroesophageal reflux disease)    Hypercholesteremia    Hypertension    Prostate cancer ([place]    Prostatism    Reflux esophagitis    Renal stones    Skin cancer    left cheek/lesion excised   Sleep apnea    Tachycardia    a. 11/2017 admit w/  tachycardia/? afib-->no afib noted upon review (amio/OAC d/c'd).   TIA (transient ischemic attack)    Past Surgical History:  Procedure Laterality Date   CARDIAC CATHETERIZATION     COLONOSCOPY WITH PROPOFOL N/A 09/13/2015   Procedure: COLONOSCOPY WITH PROPOFOL;  Surgeon: MLollie Sails MD;  [facility]  Service: Endoscopy;  Laterality: N/A;   COLONOSCOPY WITH PROPOFOL N/A 05/07/2019   Procedure: COLONOSCOPY WITH PROPOFOL;  Surgeon: BRobert Bellow MD;  Location: [facility] ENDOSCOPY;  Service: Endoscopy;  Laterality: N/A;   CORONARY ANGIOPLASTY     ESOPHAGOGASTRODUODENOSCOPY (EGD) WITH PROPOFOL N/A 09/13/2015   Procedure: ESOPHAGOGASTRODUODENOSCOPY (EGD) WITH PROPOFOL;  Surgeon: MLollie Sails MD;  [facility];  Service: Endoscopy;  Laterality: N/A;   ESOPHAGOGASTRODUODENOSCOPY (EGD) WITH PROPOFOL N/A 12/28/2015   Procedure: ESOPHAGOGASTRODUODENOSCOPY (EGD) WITH PROPOFOL;  Surgeon: MLollie Sails MD;  [facility];  Service: Endoscopy;  Laterality: N/A;   ESOPHAGOGASTRODUODENOSCOPY (EGD) WITH PROPOFOL N/A 06/16/2016   Procedure: ESOPHAGOGASTRODUODENOSCOPY (EGD) WITH PROPOFOL;  Surgeon: MLollie Sails MD;  [facility];  Service: Endoscopy;  Laterality: N/A;   ESOPHAGOGASTRODUODENOSCOPY (EGD) WITH PROPOFOL N/A 05/07/2019   Procedure: ESOPHAGOGASTRODUODENOSCOPY (EGD) WITH PROPOFOL;  Surgeon: BRobert Bellow MD;  Location: [facility] ENDOSCOPY;  Service: Endoscopy;  Laterality: N/A;   EYE SURGERY  2013   CATARACT EXTRACTION   GANGLION CYST EXCISION Right 05/18/2017   Procedure: REMOVAL GANGLION CYST ANKLE;  Surgeon: Samara Deist, DPM;  Location: [facility] ORS;  Service: Podiatry;  Laterality: Right;   heart cath stent     LEFT HEART CATH AND CORONARY ANGIOGRAPHY Right 12/03/2017   Procedure: Left Heart Cath and Coronary Angiography with possible coronary intervention;  Surgeon: Dionisio David, MD;  Location: [place] CV LAB;  Service:  Cardiovascular;  Laterality: Right;   PACEMAKER IMPLANT N/A 04/04/2021   Procedure: PACEMAKER IMPLANT;  Surgeon: Deboraha Sprang, MD;  Location: [place] CV LAB;  Service: Cardiovascular;  Laterality: N/A;   PROSTATE BIOPSY     RIGHT/LEFT HEART CATH AND CORONARY ANGIOGRAPHY N/A 02/07/2021   Procedure: RIGHT/LEFT HEART CATH AND CORONARY ANGIOGRAPHY;  Surgeon: [name], MD;  Location: [place] CV LAB;  Service: Cardiovascular;  Laterality: N/A;   stents     multiple   Family History  Problem Relation Age of Onset   Cancer Mother        gastric and lung   Cancer Father        multiple myeloma   Stroke Father    Cancer Sister        leukemia   Cancer Brother        leukemia   Cancer Brother        kidney   Cancer Daughter        Uterine   Cancer Other        [place] (Sister's Son): Prostate   Social History   Socioeconomic History   Marital status: Married    Spouse name: Not on file   Number of children: Not on file   Years of education: Not on file   Highest education level: Not on file  Occupational History   Not on file  Tobacco Use   Smoking status: Former    Packs/day: 1.00    Years: 35.00    Total pack years: 35.00    Types: Cigarettes    Quit date: 03/06/1986    Years since quitting: 35.9   Smokeless tobacco: Former    Types: Nurse, children's Use: Never used  Substance and Sexual Activity   Alcohol use: No   Drug use: No   Sexual activity: Not Currently  Other Topics Concern   Not on file  Social History Narrative   Not on file   Social Determinants of Health   Financial Resource Strain: Low Risk  (10/04/2021)   Overall Financial Resource Strain (CARDIA)    Difficulty of Paying Living Expenses: Not hard at all  Food Insecurity: No Food Insecurity (10/04/2021)   Hunger Vital Sign    Worried About Running Out of Food in the Last Year: Never true    [place] in the Last Year: Never true  Transportation Needs: No  Transportation Needs (10/04/2021)   PRAPARE - Hydrologist (Medical): No    Lack of Transportation (Non-Medical): No  Physical Activity: Insufficiently Active (10/04/2021)   Exercise Vital Sign    Days of Exercise per Week: 4 days    Minutes of Exercise per Session: 30 min  Stress: No Stress Concern Present (10/04/2021)   [place]    Feeling of Stress : Not at all  Social Connections: Unknown (10/04/2021)   Social Connection  and Isolation Panel [NHANES]    Frequency of Communication with Friends and Family: Not on file    Frequency of Social Gatherings with Friends and Family: Not on file    Attends Religious Services: Not on file    Active Member of Clubs or Organizations: Not on file    Attends Archivist Meetings: Not on file    Marital Status: Married     Review of Systems     Objective:     There were no vitals taken for this visit. Wt Readings from Last 3 Encounters:  01/23/22 221 lb 9.6 oz (100.5 kg)  01/13/22 221 lb 2 oz (100.3 kg)  11/21/21 216 lb (98 kg)    Physical Exam   Outpatient Encounter Medications as of 01/30/2022  Medication Sig   amLODipine (NORVASC) 2.5 MG tablet TAKE 1 TABLET BY MOUTH ONCE DAILY *REDUCED DOSE*   azelastine (ASTELIN) 0.1 % nasal spray    Calcium Carb-Cholecalciferol (CALCIUM + D3 PO) Take 1 tablet by mouth daily.   cholecalciferol (VITAMIN D) 1000 units tablet Take 1,000 Units by mouth daily.    clopidogrel (PLAVIX) 75 MG tablet TAKE 1 TABLET BY MOUTH ONCE DAILY   ferrous sulfate 324 MG TBEC Take 324 mg by mouth daily.   furosemide (LASIX) 20 MG tablet TAKE 1 TABLET BY MOUTH ONCE DAILY   isosorbide mononitrate (IMDUR) 30 MG 24 hr tablet TAKE 1 TABLET BY MOUTH ONCE DAILY   Leuprolide Acetate, 3 Month, (ELIGARD) 22.5 MG injection Inject 22.5 mg into the skin every 4 (four) months.   losartan (COZAAR) 50 MG tablet TAKE 1 TABLET BY MOUTH  ONCE DAILY   lovastatin (MEVACOR) 40 MG tablet TAKE 2 TABLETS BY MOUTH AT BEDTIME   Multiple Vitamin (MULTI-VITAMINS) TABS Take 1 tablet by mouth daily.    pantoprazole (PROTONIX) 40 MG tablet Take 40 mg by mouth 2 (two) times daily.    No facility-administered encounter medications on file as of 01/30/2022.     Lab Results  Component Value Date   WBC 7.5 11/21/2021   HGB 13.4 11/21/2021   HCT 39.3 11/21/2021   PLT 196 11/21/2021   GLUCOSE 101 (H) 01/25/2022   CHOL 140 01/25/2022   TRIG 105.0 01/25/2022   HDL 38.30 (L) 01/25/2022   LDLCALC 81 01/25/2022   ALT 13 01/25/2022   AST 17 01/25/2022   NA 140 01/25/2022   K 3.8 01/25/2022   CL 106 01/25/2022   CREATININE 1.24 01/25/2022   BUN 23 01/25/2022   CO2 28 01/25/2022   TSH 2.15 01/25/2022   PSA 5.47 (H) 08/03/2016   HGBA1C 5.9 01/25/2022       Assessment & Plan:   Problem List Items Addressed This Visit   None    Einar Pheasant, MD

## 2022-01-30 NOTE — Progress Notes (Signed)
Patient ID: Nathaniel Thompson, male   DOB: 1935/03/09, 86 y.o.   MRN: [id number] Did not show for appt.

## 2022-02-03 NOTE — Progress Notes (Signed)
Remote pacemaker transmission.   

## 2022-02-04 DIAGNOSIS — J210 Acute bronchiolitis due to respiratory syncytial virus: Secondary | ICD-10-CM | POA: Insufficient documentation

## 2022-02-06 ENCOUNTER — Other Ambulatory Visit: Payer: Self-pay | Admitting: *Deleted

## 2022-02-06 ENCOUNTER — Telehealth: Payer: Self-pay

## 2022-02-06 ENCOUNTER — Telehealth: Payer: Self-pay | Admitting: *Deleted

## 2022-02-06 DIAGNOSIS — C61 Malignant neoplasm of prostate: Secondary | ICD-10-CM

## 2022-02-06 NOTE — Telephone Encounter (Signed)
Wife called reporting that patient ws in [place] over weekend with a PE due to a leg injury acquired in a hunting accident. He was told he needs to see a hematologist for this and she is asking if Dr B can handle this for him. Please advise. His next appointment is 03/24/22

## 2022-02-06 NOTE — Telephone Encounter (Signed)
TCM call mentioned persistent cough.  Reviewed discharge summary. Please call and confirm doing ok.

## 2022-02-06 NOTE — Telephone Encounter (Signed)
Transition Care Management Follow-up Telephone Call Date of discharge and from where: [facility] 02/05/2022 How have you been since you were released from the hospital? Still coughing Any questions or concerns? No  Items Reviewed: Did the pt receive and understand the discharge instructions provided? Yes  Medications obtained and verified? Yes  Other? No  Any new allergies since your discharge? Yes  Dietary orders reviewed? Yes Do you have support at home? Yes   Home Care and Equipment/Supplies: Were home health services ordered? no If so, what is the name of the agency? N/a  Has the agency set up a time to come to the patient's home? not applicable Were any new equipment or medical supplies ordered?  No What is the name of the medical supply agency? N/a Were you able to get the supplies/equipment? not applicable Do you have any questions related to the use of the equipment or supplies? No  Functional Questionnaire: (I = Independent and D = Dependent) ADLs: D  Bathing/Dressing- D  Meal Prep- D  Eating- I  Maintaining continence- I  Transferring/Ambulation- D  Managing Meds- D  Follow up appointments reviewed:  PCP Hospital f/u appt confirmed? Yes  Scheduled to see Dr Nicki Reaper on 02/16/2022 @ 10:00. [facility] f/u appt confirmed? Yes  Scheduled to see Dr Sophronia Simas on 02/07/2022 @ 11:00. Are transportation arrangements needed? No  If their condition worsens, is the pt aware to call PCP or go to the Emergency Dept.? Yes Was the patient provided with contact information for the PCP's office or ED? Yes Was to pt encouraged to call back with questions or concerns? Yes Juanda Crumble, LPN [place] Direct Dial [phone]

## 2022-02-07 ENCOUNTER — Ambulatory Visit: Payer: Medicare HMO | Attending: Cardiovascular Disease | Admitting: Cardiovascular Disease

## 2022-02-07 ENCOUNTER — Encounter: Payer: Self-pay | Admitting: Cardiovascular Disease

## 2022-02-07 ENCOUNTER — Inpatient Hospital Stay: Payer: Medicare HMO | Admitting: Internal Medicine

## 2022-02-07 VITALS — BP 104/60 | HR 77 | Ht 70.0 in | Wt 211.4 lb

## 2022-02-07 DIAGNOSIS — I5032 Chronic diastolic (congestive) heart failure: Secondary | ICD-10-CM | POA: Diagnosis not present

## 2022-02-07 DIAGNOSIS — I2510 Atherosclerotic heart disease of native coronary artery without angina pectoris: Secondary | ICD-10-CM

## 2022-02-07 DIAGNOSIS — I480 Paroxysmal atrial fibrillation: Secondary | ICD-10-CM

## 2022-02-07 DIAGNOSIS — E785 Hyperlipidemia, unspecified: Secondary | ICD-10-CM | POA: Diagnosis not present

## 2022-02-07 DIAGNOSIS — I714 Abdominal aortic aneurysm, without rupture, unspecified: Secondary | ICD-10-CM

## 2022-02-07 DIAGNOSIS — I10 Essential (primary) hypertension: Secondary | ICD-10-CM

## 2022-02-07 DIAGNOSIS — R001 Bradycardia, unspecified: Secondary | ICD-10-CM

## 2022-02-07 MED ORDER — CLOPIDOGREL BISULFATE 75 MG PO TABS: 75.0000 mg | ORAL_TABLET | Freq: Every day | ORAL | 3 refills | Status: DC

## 2022-02-07 NOTE — Patient Instructions (Signed)
Medication Instructions:  No changes *If you need a refill on your cardiac medications before your next appointment, please call your pharmacy*   Lab Work: None ordered If you have labs (blood work) drawn today and your tests are completely normal, you will receive your results only by: [place] (if you have MyChart) OR A paper copy in the mail If you have any lab test that is abnormal or we need to change your treatment, we will call you to review the results.   Testing/Procedures: None ordered   Follow-Up: At [facility], you and your health needs are our priority.  As part of our continuing mission to provide you with exceptional heart care, we have created designated Provider Care Teams.  These Care Teams include your primary Cardiologist (physician) and Advanced Practice Providers (APPs -  Physician Assistants and Nurse Practitioners) who all work together to provide you with the care you need, when you need it.  We recommend signing up for the patient portal called "MyChart".  Sign up information is provided on this After Visit Summary.  MyChart is used to connect with patients for Virtual Visits (Telemedicine).  Patients are able to view lab/test results, encounter notes, upcoming appointments, etc.  Non-urgent messages can be sent to your provider as well.   To learn more about what you can do with MyChart, go to [email or web address].    Your next appointment:   3 month(s)  The format for your next appointment:   In Person  Provider:   You may see Kathlyn Sacramento, MD or one of the following Advanced Practice Providers on your designated Care Team:   Murray Hodgkins, NP Christell Faith, PA-C Cadence Kathlen Mody, PA-C Gerrie Nordmann, NP     Important Information About Sugar

## 2022-02-07 NOTE — Progress Notes (Signed)
Cardiology Office Note   Date:  02/07/2022   ID:  Nathaniel Thompson, DOB [birth year], MRN [id number]  PCP:  Einar Pheasant, MD  Cardiologist:   Kathlyn Sacramento, MD   Chief Complaint  Patient presents with   OTHER    ED F/u medication change c/o sob. Meds reviewed verbally with pt.        History of Present Illness: Nathaniel Thompson is a 86 y.o. male who presents for a follow-up visit regarding coronary artery disease.   He quit smoking in the 80s.  He reports having TIA in the past.  Other medical problems include metastatic prostate cancer, small abdominal aortic aneurysm followed by vascular surgery, chronic kidney disease, hyperlipidemia and essential hypertension.  He has known history of coronary artery disease status post stenting of the left circumflex and RCA.  He had initial PCI done in 2003 at [facility].  He had repeat PCI x2 at [facility] in 2004.   He was hospitalized in September 2019 with questionable tachycardia and atrial fibrillation.  Initially was placed on amiodarone and anticoagulation but all his EKGs revealed sinus rhythm at that time and thus both amiodarone and Eliquis were discontinued.  No recurrent arrhythmia since then.   Echocardiogram in March 2020 showed normal LV systolic function with grade 1 diastolic dysfunction, severely dilated left atrium, mild mitral and tricuspid regurgitation and mild pulmonary hypertension.  He had worsening chest pain and shortness of breath in December 2022.  Cardiac catheterization showed patent stents in the left circumflex and right coronary artery with no obstructive coronary artery disease.  Right heart catheterization showed mildly elevated filling pressures, mild pulmonary hypertension and normal cardiac output.  The patient was noted to have intermittent bradycardia during cardiac catheterization with heart rate going to the low 40s.    He underwent outpatient monitor which showed frequent episodes of SVT.  He was referred  to EP and underwent pacemaker placement in January 2023.  In addition, he was placed on furosemide for diastolic heart failure.  He fell recently from a deer stand and fractured his left posterior calcaneus.  He was subsequently hospitalized at [facility] recently with shortness of breath, cough and fever in the setting of his recent displaced fracture of left posterior calcaneus.  He was found to have RSV and lobar and segmental pulmonary embolism.  His pulmonary embolism was not significant to require endovascular intervention.  He was treated with anticoagulation.  He was noted to have episodes of tachycardia and was found to be in atrial fibrillation with rapid ventricular response.  This lasted for about 45 minutes.  He was placed on small dose metoprolol.  He has been doing reasonably well.  Shortness of breath improved but he continues to have intermittent episodes of coughing.  He uses his CPAP on a regular basis.   Past Medical History:  Diagnosis Date   (HFpEF) heart failure with preserved ejection fraction ([place])    a. 05/2018 Echo: Nl EF, Gr1 DD, sev dil LA, mild MR/TR, mild PAH; b. 05/2019 Echo: EF 55-60%, no rwma, mild LVH. Nl PASP. Mildly dil LA. Triv MR. Mild AoV sclerosis w/o stenosis. Mildly dil Asc AO (57m).   Allergic rhinitis    Barrett's esophagus 10/21/2013   Bone cancer (HCC)    CKD (chronic kidney disease), stage III (HCC)    Colon polyp, hyperplastic    COPD (chronic obstructive pulmonary disease) (HCC)    mild COPD. former smoker   Coronary artery disease  a. 2003 s/p PCI ([facility]); b. 2004 s/p PCI x 2 ([facility]); c. 11/2017 Cath: LM nl, LAD mild dzs, LCX patent stent w/ 30% distal edge restenosis, RCA patent distal stent w/ 30% prox edge stenosis. Nl EF->Med Rx;   Diverticulosis    Fundic gland polyps of stomach, benign    Gastritis    GERD (gastroesophageal reflux disease)    Hypercholesteremia    Hypertension    Prostate cancer ([place])    Prostatism    Reflux esophagitis     Renal stones    Skin cancer    left cheek/lesion excised   Sleep apnea    Tachycardia    a. 11/2017 admit w/ tachycardia/? afib-->no afib noted upon review (amio/OAC d/c'd).   TIA (transient ischemic attack)     Past Surgical History:  Procedure Laterality Date   CARDIAC CATHETERIZATION     COLONOSCOPY WITH PROPOFOL N/A 09/13/2015   Procedure: COLONOSCOPY WITH PROPOFOL;  Surgeon: Lollie Sails, MD;  Location[facility] ENDOSCOPY;  Service: Endoscopy;  Laterality: N/A;   COLONOSCOPY WITH PROPOFOL N/A 05/07/2019   Procedure: COLONOSCOPY WITH PROPOFOL;  Surgeon: Robert Bellow, MD;  Location: [facility] ENDOSCOPY;  Service: Endoscopy;  Laterality: N/A;   CORONARY ANGIOPLASTY     ESOPHAGOGASTRODUODENOSCOPY (EGD) WITH PROPOFOL N/A 09/13/2015   Procedure: ESOPHAGOGASTRODUODENOSCOPY (EGD) WITH PROPOFOL;  Surgeon: Lollie Sails, MD;  Location: [facility] ENDOSCOPY;  Service: Endoscopy;  Laterality: N/A;   ESOPHAGOGASTRODUODENOSCOPY (EGD) WITH PROPOFOL N/A 12/28/2015   Procedure: ESOPHAGOGASTRODUODENOSCOPY (EGD) WITH PROPOFOL;  Surgeon: Lollie Sails, MD;  Location: [facility] ENDOSCOPY;  Service: Endoscopy;  Laterality: N/A;   ESOPHAGOGASTRODUODENOSCOPY (EGD) WITH PROPOFOL N/A 06/16/2016   Procedure: ESOPHAGOGASTRODUODENOSCOPY (EGD) WITH PROPOFOL;  Surgeon: Lollie Sails, MD;  Location: [facility] ENDOSCOPY;  Service: Endoscopy;  Laterality: N/A;   ESOPHAGOGASTRODUODENOSCOPY (EGD) WITH PROPOFOL N/A 05/07/2019   Procedure: ESOPHAGOGASTRODUODENOSCOPY (EGD) WITH PROPOFOL;  Surgeon: Robert Bellow, MD;  Location: [facility] ENDOSCOPY;  Service: Endoscopy;  Laterality: N/A;   EYE SURGERY  2013   CATARACT EXTRACTION   GANGLION CYST EXCISION Right 05/18/2017   Procedure: REMOVAL GANGLION CYST ANKLE;  Surgeon: Samara Deist, DPM;  Location: [facility] ORS;  Service: Podiatry;  Laterality: Right;   heart cath stent     LEFT HEART CATH AND CORONARY ANGIOGRAPHY Right 12/03/2017   Procedure: Left Heart Cath and Coronary  Angiography with possible coronary intervention;  Surgeon: Dionisio David, MD;  Location: [place] CV LAB;  Service: Cardiovascular;  Laterality: Right;   PACEMAKER IMPLANT N/A 04/04/2021   Procedure: PACEMAKER IMPLANT;  Surgeon: Deboraha Sprang, MD;  Location: [place] CV LAB;  Service: Cardiovascular;  Laterality: N/A;   PROSTATE BIOPSY     RIGHT/LEFT HEART CATH AND CORONARY ANGIOGRAPHY N/A 02/07/2021   Procedure: RIGHT/LEFT HEART CATH AND CORONARY ANGIOGRAPHY;  Surgeon: [name], MD;  Location: [place] CV LAB;  Service: Cardiovascular;  Laterality: N/A;   stents     multiple     Current Outpatient Medications  Medication Sig Dispense Refill   amLODipine (NORVASC) 2.5 MG tablet TAKE 1 TABLET BY MOUTH ONCE DAILY *REDUCED DOSE* 90 tablet 0   apixaban (ELIQUIS) 5 MG TABS tablet Take 10 mg by mouth 2 (two) times daily.     azelastine (ASTELIN) 0.1 % nasal spray      Calcium Carb-Cholecalciferol (CALCIUM + D3 PO) Take 1 tablet by mouth daily.     cholecalciferol (VITAMIN D) 1000 units tablet Take 1,000 Units by mouth daily.  ferrous sulfate 324 MG TBEC Take 324 mg by mouth daily.     furosemide (LASIX) 20 MG tablet TAKE 1 TABLET BY MOUTH ONCE DAILY 90 tablet 2   isosorbide mononitrate (IMDUR) 30 MG 24 hr tablet TAKE 1 TABLET BY MOUTH ONCE DAILY 90 tablet 2   Leuprolide Acetate, 3 Month, (ELIGARD) 22.5 MG injection Inject 22.5 mg into the skin every 4 (four) months.     losartan (COZAAR) 50 MG tablet TAKE 1 TABLET BY MOUTH ONCE DAILY 90 tablet 1   lovastatin (MEVACOR) 40 MG tablet TAKE 2 TABLETS BY MOUTH AT BEDTIME 180 tablet 1   metoprolol succinate (TOPROL-XL) 25 MG 24 hr tablet Take 1 tablet by mouth daily.     Multiple Vitamin (MULTI-VITAMINS) TABS Take 1 tablet by mouth daily.      pantoprazole (PROTONIX) 40 MG tablet Take 40 mg by mouth 2 (two) times daily.      clopidogrel (PLAVIX) 75 MG tablet Take 1 tablet (75 mg total) by mouth daily. 90 tablet 3   No  current facility-administered medications for this visit.    Allergies:   Atorvastatin    Social History:  The patient  reports that he quit smoking about 35 years ago. His smoking use included cigarettes. He has a 35.00 pack-year smoking history. He has quit using smokeless tobacco.  His smokeless tobacco use included chew. He reports that he does not drink alcohol and does not use drugs.   Family History:  The patient's family history includes Cancer in his brother, brother, daughter, father, mother, sister, and another family member; Stroke in his father.    ROS:  Please see the history of present illness.   Otherwise, review of systems are positive for none.   All other systems are reviewed and negative.    PHYSICAL EXAM: VS:  BP 104/60 (BP Location: Left Arm, Patient Position: Sitting, Cuff Size: Large)   Pulse 77   Ht '5\' 10"'$  (1.778 m)   Wt 211 lb 6 oz (95.9 kg)   SpO2 96%   BMI 30.33 kg/m  , BMI Body mass index is 30.33 kg/m. GEN: Well nourished, well developed, in no acute distress  HEENT: normal  Neck: no JVD, carotid bruits, or masses Cardiac: RRR; no murmurs, rubs, or gallops,no edema  Respiratory:  clear to auscultation bilaterally, normal work of breathing GI: soft, nontender, nondistended, + BS MS: no deformity or atrophy  Skin: warm and dry, no rash Neuro:  Strength and sensation are intact Psych: euthymic mood, full affect Radial pulses normal.  EKG:  EKG is ordered today. The ekg ordered today demonstrates atrial paced rhythm with no significant ST or T wave changes.   Recent Labs: 03/15/2021: B Natriuretic Peptide 114.6 11/21/2021: Hemoglobin 13.4; Platelets 196 01/25/2022: ALT 13; BUN 23; Creatinine, Ser 1.24; Potassium 3.8; Sodium 140; TSH 2.15    Lipid Panel    Component Value Date/Time   CHOL 140 01/25/2022 0756   CHOL 103 09/16/2013 0438   TRIG 105.0 01/25/2022 0756   TRIG 95 09/16/2013 0438   HDL 38.30 (L) 01/25/2022 0756   HDL 30 (L)  09/16/2013 0438   CHOLHDL 4 01/25/2022 0756   VLDL 21.0 01/25/2022 0756   VLDL 19 09/16/2013 0438   LDLCALC 81 01/25/2022 0756   LDLCALC 54 09/16/2013 0438      Wt Readings from Last 3 Encounters:  02/07/22 211 lb 6 oz (95.9 kg)  01/23/22 221 lb 9.6 oz (100.5 kg)  01/13/22 221 lb 2  oz (100.3 kg)          04/15/2019    2:58 PM  PAD Screen  Previous PAD dx? No  Previous surgical procedure? No  Pain with walking? No  Feet/toe relief with dangling? No  Painful, non-healing ulcers? No  Extremities discolored? No      ASSESSMENT AND PLAN:  1.  Coronary artery disease involving native coronary arteries without angina: Cardiac catheterization in December of last year showed patent stents with no obstructive disease.  Given that he is on anticoagulation now with Eliquis, I elected to hold clopidogrel.  2.  Chronic diastolic heart failure: His filling pressures were mildly elevated and his symptoms improved significantly with small dose furosemide.  3.  Essential hypertension: Blood pressure is controlled on current medications.  4.  Hyperlipidemia:  History of intolerance to potent statins.  He tolerates lovastatin 40 mg daily.  I reviewed most recent lipid profile which showed an LDL of 64.  5.  Small abdominal aortic aneurysm: Less than 4 cm.  Followed by vascular surgery.  This has been stable.  In addition, he has mild nonobstructive carotid disease.  6.  Symptomatic bradycardia: Status post permanent pacemaker placement.    7.  Recent PE likely due to lower extremity DVT: He is currently on anticoagulation with Eliquis.  Minimal duration is 3 months.  8.  Paroxysmal atrial fibrillation: The burden of A-fib on device interrogation was less than 1%.  However, he did have an episode that lasted about 45 minutes recently while hospitalized at [facility].  I will review with Dr. Caryl Comes and see if there is an indication to continue anticoagulation long-term.    Disposition: Follow-up  in 3 months.   Signed,  Kathlyn Sacramento, MD  02/07/2022 3:38 PM    [place]

## 2022-02-07 NOTE — Telephone Encounter (Signed)
S/w Mrs Chura -   Stated Clair Gulling is still coughing - coughs up yellow phlegm. Cough is constant, all day. Pt does have some congestion - uses mucinex, Mrs Raffo said that it seems to work ok.  No sore throat, fever, body aches, s.o.b, chest pain  They are going to see Dr Audelia Acton at 320pm today, Mrs Codispoti was going to ask him if there was anything he could suggest for the cough.

## 2022-02-07 NOTE — Telephone Encounter (Signed)
Please call to follow up with Nathaniel Thompson regarding his appt with Dr Fletcher Anon.  If still cough and no new medication, then see if she is still taking tessalon perles.  If no, then can call in tessalon perles '100mg'$  tid prn cough and congestion #21 with no refills.  Also, robitussin DM bid prn cough. If persistent symptoms, will need to be evaluated.

## 2022-02-08 NOTE — Telephone Encounter (Signed)
Attemtped to call pt - Mrs Nathaniel Thompson asked if she can cb - just hopped into the shower

## 2022-02-08 NOTE — Telephone Encounter (Signed)
S/w Glenda -  Dr Fletcher Anon stated that pt's lungs sounded good - did not feel cxr necessary Recommended Delsym for pt to take for cough since Tessalon was not helping. Holley Raring stated that is seemed to have helped.  Pt slept comfortably all night without coughing. Less cough today. Will continue Delsym while pt heals.

## 2022-02-10 ENCOUNTER — Inpatient Hospital Stay: Payer: Medicare HMO | Admitting: Internal Medicine

## 2022-02-10 ENCOUNTER — Telehealth: Payer: Self-pay | Admitting: Internal Medicine

## 2022-02-10 NOTE — Telephone Encounter (Signed)
S/w Glenda - advised to try Robitussin as per previous message. Mrs Meyerhoff also just purchased a humidifier to try to help w/ cough.

## 2022-02-10 NOTE — Telephone Encounter (Signed)
Pt spouse called stating pt is still coughing. Pt has taken Mucinex, tessalon pearls and delsym and can not get rid of the cough. Pt wife would like to be called

## 2022-02-10 NOTE — Telephone Encounter (Signed)
That was a previous message.  Dr Fletcher Anon - told him to try delsym.  If persistent cough, may need to be evaluated.

## 2022-02-13 NOTE — Telephone Encounter (Signed)
Called to f/u on status of pt - Per Mrs Ladouceur, cough is getting better! Pt is sleeping through the night, coughing less during the day.

## 2022-02-16 ENCOUNTER — Inpatient Hospital Stay (HOSPITAL_BASED_OUTPATIENT_CLINIC_OR_DEPARTMENT_OTHER): Payer: Medicare HMO | Admitting: Internal Medicine

## 2022-02-16 ENCOUNTER — Ambulatory Visit: Payer: Medicare HMO | Admitting: Internal Medicine

## 2022-02-16 ENCOUNTER — Encounter: Payer: Self-pay | Admitting: Internal Medicine

## 2022-02-16 ENCOUNTER — Inpatient Hospital Stay: Payer: Medicare HMO | Attending: Internal Medicine

## 2022-02-16 VITALS — BP 120/68 | HR 69 | Temp 98.1°F | Resp 15 | Ht 70.0 in | Wt 209.3 lb

## 2022-02-16 VITALS — BP 130/83 | HR 76 | Temp 98.3°F | Resp 18 | Wt 221.9 lb

## 2022-02-16 DIAGNOSIS — I10 Essential (primary) hypertension: Secondary | ICD-10-CM

## 2022-02-16 DIAGNOSIS — Z7901 Long term (current) use of anticoagulants: Secondary | ICD-10-CM | POA: Diagnosis not present

## 2022-02-16 DIAGNOSIS — Z87891 Personal history of nicotine dependence: Secondary | ICD-10-CM | POA: Diagnosis not present

## 2022-02-16 DIAGNOSIS — C7951 Secondary malignant neoplasm of bone: Secondary | ICD-10-CM | POA: Diagnosis not present

## 2022-02-16 DIAGNOSIS — I714 Abdominal aortic aneurysm, without rupture, unspecified: Secondary | ICD-10-CM | POA: Diagnosis not present

## 2022-02-16 DIAGNOSIS — E7800 Pure hypercholesterolemia, unspecified: Secondary | ICD-10-CM | POA: Diagnosis not present

## 2022-02-16 DIAGNOSIS — K219 Gastro-esophageal reflux disease without esophagitis: Secondary | ICD-10-CM

## 2022-02-16 DIAGNOSIS — K2270 Barrett's esophagus without dysplasia: Secondary | ICD-10-CM

## 2022-02-16 DIAGNOSIS — I5032 Chronic diastolic (congestive) heart failure: Secondary | ICD-10-CM | POA: Diagnosis not present

## 2022-02-16 DIAGNOSIS — R059 Cough, unspecified: Secondary | ICD-10-CM

## 2022-02-16 DIAGNOSIS — Z86711 Personal history of pulmonary embolism: Secondary | ICD-10-CM | POA: Diagnosis not present

## 2022-02-16 DIAGNOSIS — R739 Hyperglycemia, unspecified: Secondary | ICD-10-CM

## 2022-02-16 DIAGNOSIS — I724 Aneurysm of artery of lower extremity: Secondary | ICD-10-CM

## 2022-02-16 DIAGNOSIS — C61 Malignant neoplasm of prostate: Secondary | ICD-10-CM | POA: Diagnosis present

## 2022-02-16 DIAGNOSIS — J449 Chronic obstructive pulmonary disease, unspecified: Secondary | ICD-10-CM

## 2022-02-16 DIAGNOSIS — D649 Anemia, unspecified: Secondary | ICD-10-CM

## 2022-02-16 DIAGNOSIS — I25119 Atherosclerotic heart disease of native coronary artery with unspecified angina pectoris: Secondary | ICD-10-CM

## 2022-02-16 DIAGNOSIS — N1831 Chronic kidney disease, stage 3a: Secondary | ICD-10-CM

## 2022-02-16 DIAGNOSIS — I4891 Unspecified atrial fibrillation: Secondary | ICD-10-CM

## 2022-02-16 DIAGNOSIS — I2699 Other pulmonary embolism without acute cor pulmonale: Secondary | ICD-10-CM

## 2022-02-16 DIAGNOSIS — D509 Iron deficiency anemia, unspecified: Secondary | ICD-10-CM | POA: Diagnosis not present

## 2022-02-16 DIAGNOSIS — I480 Paroxysmal atrial fibrillation: Secondary | ICD-10-CM | POA: Diagnosis not present

## 2022-02-16 DIAGNOSIS — I6523 Occlusion and stenosis of bilateral carotid arteries: Secondary | ICD-10-CM

## 2022-02-16 LAB — CBC WITH DIFFERENTIAL/PLATELET
Abs Immature Granulocytes: 0.03 10*3/uL (ref 0.00–0.07)
Basophils Absolute: 0 10*3/uL (ref 0.0–0.1)
Basophils Relative: 1 %
Eosinophils Absolute: 0.3 10*3/uL (ref 0.0–0.5)
Eosinophils Relative: 5 %
HCT: 36.5 % — ABNORMAL LOW (ref 39.0–52.0)
Hemoglobin: 12.3 g/dL — ABNORMAL LOW (ref 13.0–17.0)
Immature Granulocytes: 1 %
Lymphocytes Relative: 27 %
Lymphs Abs: 1.6 10*3/uL (ref 0.7–4.0)
MCH: 29.6 pg (ref 26.0–34.0)
MCHC: 33.7 g/dL (ref 30.0–36.0)
MCV: 87.7 fL (ref 80.0–100.0)
Monocytes Absolute: 0.5 10*3/uL (ref 0.1–1.0)
Monocytes Relative: 9 %
Neutro Abs: 3.4 10*3/uL (ref 1.7–7.7)
Neutrophils Relative %: 57 %
Platelets: 288 10*3/uL (ref 150–400)
RBC: 4.16 MIL/uL — ABNORMAL LOW (ref 4.22–5.81)
RDW: 12.5 % (ref 11.5–15.5)
WBC: 5.8 10*3/uL (ref 4.0–10.5)
nRBC: 0 % (ref 0.0–0.2)

## 2022-02-16 LAB — COMPREHENSIVE METABOLIC PANEL
ALT: 15 U/L (ref 0–44)
AST: 21 U/L (ref 15–41)
Albumin: 3.8 g/dL (ref 3.5–5.0)
Alkaline Phosphatase: 65 U/L (ref 38–126)
Anion gap: 9 (ref 5–15)
BUN: 21 mg/dL (ref 8–23)
CO2: 26 mmol/L (ref 22–32)
Calcium: 9.4 mg/dL (ref 8.9–10.3)
Chloride: 105 mmol/L (ref 98–111)
Creatinine, Ser: 1.15 mg/dL (ref 0.61–1.24)
GFR, Estimated: >60 mL/min (ref >60)
Glucose, Bld: 99 mg/dL (ref 70–99)
Potassium: 4.3 mmol/L (ref 3.5–5.1)
Sodium: 140 mmol/L (ref 135–145)
Total Bilirubin: 0.7 mg/dL (ref 0.3–1.2)
Total Protein: 7.3 g/dL (ref 6.5–8.1)

## 2022-02-16 NOTE — Assessment & Plan Note (Addendum)
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 Stage IV-on Eligard; Bone scan October 2020-improved;  SEP  PSA- <0.1.  Currently getting intermittent Eligard.  # Iron deficient anemia/CKD- III-[s/p EGD March 2021]--status post IV iron infusion x1 [FEB 2022]-today hemoglobin is 12.   Overall stable.  Continue oral iron.  # Nov 26th, 2023-acute PE -lobar and segmental PE [[facility]]' - on Eliquis [no asprin/plavix]-secondary to recent left foot surgery.  Recommend minimum anticoagulation after 3 months.  However given history of paroxysmal A-fib-could consider longer-term anticoagulation.  However defer to cardiology.  I reached out to pharmacy social worker regarding patient's financial concerns regarding ongoing Eliquis prescription.  Patient is currently on Eliquis 5 mg twice daily.  #CKD stage III-GFR 49- .  Clinically stable.  # A. fib paroxysmal-sinus rhythm [dizzy spells] a.fib-s/p ppm-currently off Plavix and aspirin -see above.  Defer to cardiology regarding longer-term anticoagulation.  # DISPOSITION:  # RE-SCHEDULE Follow up in 1st week of March 2024 MD;labs- [facility].cmp/iron studies; ferritin; PSA eligard; possible venofer- Dr.B  Cc; Dr.Scott/

## 2022-02-16 NOTE — Progress Notes (Signed)
Patient ID: Nathaniel Thompson, male   DOB: [birth year], 86 y.o.   MRN: [id number]   Subjective:    Patient ID: Nathaniel Thompson, male    DOB: February 09, 1936, 86 y.o.   MRN: [id number]   Patient here for hospital follow up.   Chief Complaint  Patient presents with   Hospitalization Follow-up   .   HPI Here for hospital follow up. Admitted 02/04/22 - 02/05/22. He is accompanied by his wife. History obtained from both of them.  Presented 02/04/22 - acute onset of cough, fever and sob.  Found to have RSV and lobar and segmental PEs.  it was determined that his PE did not require interventional management as he was hemodynamically stable and without O2 requirement. Likely his PE was provoked in setting of a recent left ankle/foot injury noted in late November 2023 for which she suffered a calcaneal avulsion fracture, nonoperatively managed with a boot.  Initially treated with heparin.  Transitioned to eliquis.  While hospitalized, afib with RVR. Placed on low dose metoprolol.  Overall he feels he is doing relatively well. Cough is better. Dizziness is better/not a significant issue.  Sees  Dr Caryl Comes - last 07/26/21 - f/u pacemaker.  Stable.  Recommended continuing current medication regimen. Saw GI 08/05/21 - f/u GERD/Barretts.  No upper symptoms.  Felt stable.  Per note, hold on f/u EGD.  Saw Dr Rogue Bussing 07/20/21 - f/u prostate cancer - continued treatment.  Tolerating.  Has appt today.     Past Medical History:  Diagnosis Date   (HFpEF) heart failure with preserved ejection fraction ([place])    a. 05/2018 Echo: Nl EF, Gr1 DD, sev dil LA, mild MR/TR, mild PAH; b. 05/2019 Echo: EF 55-60%, no rwma, mild LVH. Nl PASP. Mildly dil LA. Triv MR. Mild AoV sclerosis w/o stenosis. Mildly dil Asc AO (43m).   Allergic rhinitis    Barrett's esophagus 10/21/2013   Bone cancer (HCC)    CKD (chronic kidney disease), stage III (HCC)    Colon polyp, hyperplastic    COPD (chronic obstructive pulmonary disease) (HCC)    mild COPD.  former smoker   Coronary artery disease    a. 2003 s/p PCI ([facility]); b. 2004 s/p PCI x 2 ([facility]); c. 11/2017 Cath: LM nl, LAD mild dzs, LCX patent stent w/ 30% distal edge restenosis, RCA patent distal stent w/ 30% prox edge stenosis. Nl EF->Med Rx;   Diverticulosis    Fundic gland polyps of stomach, benign    Gastritis    GERD (gastroesophageal reflux disease)    Hypercholesteremia    Hypertension    [place]state [place]    Prostatism    Reflux esophagitis    Renal stones    Skin cancer    left cheek/lesion excised   Sleep apnea    Tachycardia    a. 11/2017 admit w/ tachycardia/? afib-->no afib noted upon review (amio/OAC d/c'd).   TIA (transient ischemic attack)    Past Surgical History:  Procedure Laterality Date   CARDIAC CATHETERIZATION     COLONOSCOPY WITH PROPOFOL N/A 09/13/2015   Procedure: COLONOSCOPY WITH PROPOFOL;  Surgeon: MLollie Sails MD;  [facility]  Service: Endoscopy;  Laterality: N/A;   COLONOSCOPY WITH PROPOFOL N/A 05/07/2019   Procedure: COLONOSCOPY WITH PROPOFOL;  Surgeon: BRobert Bellow MD;  Location: [facility] ENDOSCOPY;  Service: Endoscopy;  Laterality: N/A;   CORONARY ANGIOPLASTY     ESOPHAGOGASTRODUODENOSCOPY (EGD) WITH PROPOFOL N/A 09/13/2015   Procedure: ESOPHAGOGASTRODUODENOSCOPY (EGD) WITH PROPOFOL;  Surgeon: Lollie Sails, MD;  [facility]COPY;  Service: Endoscopy;  Laterality: N/A;   ESOPHAGOGASTRODUODENOSCOPY (EGD) WITH PROPOFOL N/A 12/28/2015   Procedure: ESOPHAGOGASTRODUODENOSCOPY (EGD) WITH PROPOFOL;  Surgeon: Lollie Sails, MD;  Location: [facility] ENDOSCOPY;  Service: Endoscopy;  Laterality: N/A;   ESOPHAGOGASTRODUODENOSCOPY (EGD) WITH PROPOFOL N/A 06/16/2016   Procedure: ESOPHAGOGASTRODUODENOSCOPY (EGD) WITH PROPOFOL;  Surgeon: Lollie Sails, MD;  Location: [facility] ENDOSCOPY;  Service: Endoscopy;  Laterality: N/A;   ESOPHAGOGASTRODUODENOSCOPY (EGD) WITH PROPOFOL N/A 05/07/2019   Procedure: ESOPHAGOGASTRODUODENOSCOPY (EGD)  WITH PROPOFOL;  Surgeon: Robert Bellow, MD;  Location: [facility] ENDOSCOPY;  Service: Endoscopy;  Laterality: N/A;   EYE SURGERY  2013   CATARACT EXTRACTION   GANGLION CYST EXCISION Right 05/18/2017   Procedure: REMOVAL GANGLION CYST ANKLE;  Surgeon: Samara Deist, DPM;  Location: [facility] ORS;  Service: Podiatry;  Laterality: Right;   heart cath stent     LEFT HEART CATH AND CORONARY ANGIOGRAPHY Right 12/03/2017   Procedure: Left Heart Cath and Coronary Angiography with possible coronary intervention;  Surgeon: Dionisio David, MD;  Location: [place] CV LAB;  Service: Cardiovascular;  Laterality: Right;   PACEMAKER IMPLANT N/A 04/04/2021   Procedure: PACEMAKER IMPLANT;  Surgeon: Deboraha Sprang, MD;  Location: [place] CV LAB;  Service: Cardiovascular;  Laterality: N/A;   PROSTATE BIOPSY     RIGHT/LEFT HEART CATH AND CORONARY ANGIOGRAPHY N/A 02/07/2021   Procedure: RIGHT/LEFT HEART CATH AND CORONARY ANGIOGRAPHY;  Surgeon: [name], MD;  Location: [place] CV LAB;  Service: Cardiovascular;  Laterality: N/A;   stents     multiple   Family History  Problem Relation Age of Onset   Cancer Mother        gastric and lung   Cancer Father        multiple myeloma   Stroke Father    Cancer Sister        leukemia   Cancer Brother        leukemia   Cancer Brother        kidney   Cancer Daughter        Uterine   Cancer Other        [place] (Sister's Son): Prostate   Social History   Socioeconomic History   Marital status: Married    Spouse name: Not on file   Number of children: Not on file   Years of education: Not on file   Highest education level: Not on file  Occupational History   Not on file  Tobacco Use   Smoking status: Former    Packs/day: 1.00    Years: 35.00    Total pack years: 35.00    Types: Cigarettes    Quit date: 03/06/1986    Years since quitting: 36.0   Smokeless tobacco: Former    Types: Nurse, children's Use: Never used  Substance  and Sexual Activity   Alcohol use: No   Drug use: No   Sexual activity: Not Currently  Other Topics Concern   Not on file  Social History Narrative   Not on file   Social Determinants of Health   Financial Resource Strain: Low Risk  (10/04/2021)   Overall Financial Resource Strain (CARDIA)    Difficulty of Paying Living Expenses: Not hard at all  Food Insecurity: No Food Insecurity (10/04/2021)   Hunger Vital Sign    Worried About Running Out of Food in the Last Year: Never true  Ran Out of Food in the Last Year: Never true  Transportation Needs: No Transportation Needs (10/04/2021)   PRAPARE - Hydrologist (Medical): No    Lack of Transportation (Non-Medical): No  Physical Activity: Insufficiently Active (10/04/2021)   Exercise Vital Sign    Days of Exercise per Week: 4 days    Minutes of Exercise per Session: 30 min  Stress: No Stress Concern Present (10/04/2021)   [place]    Feeling of Stress : Not at all  Social Connections: Unknown (10/04/2021)   Social Connection and Isolation Panel [NHANES]    Frequency of Communication with Friends and Family: Not on file    Frequency of Social Gatherings with Friends and Family: Not on file    Attends Religious Services: Not on file    Active Member of Clubs or Organizations: Not on file    Attends Archivist Meetings: Not on file    Marital Status: Married     Review of Systems  Constitutional:  Negative for appetite change and unexpected weight change.  HENT:  Negative for congestion and sinus pressure.   Respiratory:  Negative for chest tightness and shortness of breath.        Cough improved.   Cardiovascular:  Negative for chest pain, palpitations and leg swelling.  Gastrointestinal:  Negative for abdominal pain, diarrhea, nausea and vomiting.  Genitourinary:  Negative for dysuria and frequency.  Musculoskeletal:  Negative for  joint swelling and myalgias.  Skin:  Negative for color change and rash.  Neurological:  Negative for headaches.       Dizziness not a significant issue for him currently.   Psychiatric/Behavioral:  Negative for agitation and dysphoric mood.        Objective:     BP 120/68 (BP Location: Left Arm, Patient Position: Sitting, Cuff Size: Large)   Pulse 69   Temp 98.1 F (36.7 C) (Temporal)   Resp 15   Ht _0  (1.778 m)   Wt 209 lb 4.8 oz (94.9 kg)   SpO2 97%   BMI 30.03 kg/m  Wt Readings from Last 3 Encounters:  02/16/22 221 lb 14.4 oz (100.7 kg)  02/16/22 209 lb 4.8 oz (94.9 kg)  02/07/22 211 lb 6 oz (95.9 kg)    Physical Exam Constitutional:      General: He is not in acute distress.    Appearance: Normal appearance. He is well-developed.  HENT:     Head: Normocephalic and atraumatic.     Right Ear: External ear normal.     Left Ear: External ear normal.  Eyes:     General: No scleral icterus.       Right eye: No discharge.        Left eye: No discharge.  Cardiovascular:     Rate and Rhythm: Normal rate and regular rhythm.  Pulmonary:     Effort: Pulmonary effort is normal. No respiratory distress.     Breath sounds: Normal breath sounds.  Abdominal:     General: Bowel sounds are normal.     Palpations: Abdomen is soft.     Tenderness: There is no abdominal tenderness.  Musculoskeletal:        General: No swelling or tenderness.     Cervical back: Neck supple. No tenderness.  Lymphadenopathy:     Cervical: No cervical adenopathy.  Skin:    Findings: No erythema or rash.  Neurological:  Mental Status: He is alert.  Psychiatric:        Mood and Affect: Mood normal.        Behavior: Behavior normal.      Outpatient Encounter Medications as of 02/16/2022  Medication Sig   amLODipine (NORVASC) 2.5 MG tablet TAKE 1 TABLET BY MOUTH ONCE DAILY *REDUCED DOSE*   azelastine (ASTELIN) 0.1 % nasal spray    Calcium Carb-Cholecalciferol (CALCIUM + D3 PO) Take 1  tablet by mouth daily.   cholecalciferol (VITAMIN D) 1000 units tablet Take 1,000 Units by mouth daily.    ferrous sulfate 324 MG TBEC Take 324 mg by mouth daily.   furosemide (LASIX) 20 MG tablet TAKE 1 TABLET BY MOUTH ONCE DAILY   isosorbide mononitrate (IMDUR) 30 MG 24 hr tablet TAKE 1 TABLET BY MOUTH ONCE DAILY   Leuprolide Acetate, 3 Month, (ELIGARD) 22.5 MG injection Inject 22.5 mg into the skin every 4 (four) months.   losartan (COZAAR) 50 MG tablet TAKE 1 TABLET BY MOUTH ONCE DAILY   lovastatin (MEVACOR) 40 MG tablet TAKE 2 TABLETS BY MOUTH AT BEDTIME   Multiple Vitamin (MULTI-VITAMINS) TABS Take 1 tablet by mouth daily.    pantoprazole (PROTONIX) 40 MG tablet Take 40 mg by mouth 2 (two) times daily.    [DISCONTINUED] apixaban (ELIQUIS) 5 MG TABS tablet Take 10 mg by mouth 2 (two) times daily.   [DISCONTINUED] metoprolol succinate (TOPROL-XL) 25 MG 24 hr tablet Take 1 tablet by mouth daily.   [DISCONTINUED] clopidogrel (PLAVIX) 75 MG tablet Take 1 tablet (75 mg total) by mouth daily. (Patient not taking: Reported on 02/16/2022)   No facility-administered encounter medications on file as of 02/16/2022.     Lab Results  Component Value Date   WBC 5.8 02/16/2022   HGB 12.3 (L) 02/16/2022   HCT 36.5 (L) 02/16/2022   PLT 288 02/16/2022   GLUCOSE 99 02/16/2022   CHOL 140 01/25/2022   TRIG 105.0 01/25/2022   HDL 38.30 (L) 01/25/2022   LDLCALC 81 01/25/2022   ALT 15 02/16/2022   AST 21 02/16/2022   NA 140 02/16/2022   K 4.3 02/16/2022   CL 105 02/16/2022   CREATININE 1.15 02/16/2022   BUN 21 02/16/2022   CO2 26 02/16/2022   TSH 2.15 01/25/2022   PSA 5.47 (H) 08/03/2016   HGBA1C 5.9 01/25/2022    DG Chest 2 View  Result Date: 04/04/2021 CLINICAL DATA:  Postop.  Status post pacer placement. EXAM: CHEST - 2 VIEW COMPARISON:  03/15/2021 FINDINGS: Interval placement of left chest wall pacer device with lead in the right atrial appendage and right ventricle. No pneumothorax  identified following pacer placement. Heart size is normal. No pleural effusion or edema. Calcified granuloma is again noted in the right upper lobe. IMPRESSION: Status post pacer placement. No pneumothorax. Electronically Signed   By: Kerby Moors M.D.   On: 04/04/2021 18:43   Medications reviewed and updated.     Assessment & Plan:   Problem List Items Addressed This Visit     Abdominal aortic aneurysm (AAA) ([place]) - Primary    Saw [facility] Arna Medici) 01/2022 - recommended f/u aortic duplex in 12 months.       Anemia    Followed by hematology.  Has received IV iron.  Follow.       Atrial fibrillation ([place])    Appears to be in SR today.  Follow.       Barrett's esophagus    Previously saw GI. Continue PPI.  Elected to hold on EGD.  Follow.       Carotid artery disease ([place])    Carotid ultrasound 01/2021 - 1-39% bilaterally.       Chronic heart failure with preserved ejection fraction ([place])    Followed by cardiology.  ECHO - 05/2019 - EF 55-60%.  Continues on losartan, imdur and amlodipine.  Blood pressure doing well.  Stable.       CKD (chronic kidney disease) stage 3, GFR 30-59 ml/min (HCC)    GFR improved recent check.  Continue to avoid antiinflammatories. Follow metabolic panel.       COPD (chronic obstructive pulmonary disease) (HCC)    Breathing stable.       Coronary artery disease involving native heart with angina pectoris [facility])    Dr Fletcher Anon - 01/12/22 -  Cardiac catheterization in December showed patent stents with no obstructive disease. Continue plavix.  Aspirin stopped.       Cough    Recently admitted with RSV.  Cough is better.  Breathing better.  Follow.       GERD without esophagitis    Continue protonix.       Hypercholesterolemia    Continue lovastatin.  Follow lipid panel and liver function tests.        Relevant Orders   Lipid panel   Hepatic function panel   Hyperglycemia    Follow met b and a1c.       Relevant Orders   Hemoglobin A1c    Hypertension, essential    Blood pressure as outlined.  On imdur, amlodipine and losartan.  Continue to follow pressure.  Continue to monitor metabolic panel.        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  Evaluated 01/2020.  Stable.  Recommended f/u scanning in 2 years.       Pulmonary emboli (HCC)    Recent - PE - diagnosed during recent hospitalization.  On eliquis.  Off plavix.  Continue eliquis. Discussed the need for f/u with hematology - duration of eliquis.  History of afib - may need more long term treatment - eliquis.  F/u labs - including cbc - to be drawn today.         Einar Pheasant, MD

## 2022-02-16 NOTE — Progress Notes (Signed)
Recent [facility] hospitalization with PE.  Started difficulty breathing 1 week after an accident where he broke bones in left leg.    Has been seen by his cardiologist since hospitalization.

## 2022-02-16 NOTE — Progress Notes (Signed)
[place] OFFICE PROGRESS NOTE  Patient Care Team: Einar Pheasant, MD as PCP - General (Internal Medicine) [name], MD as PCP - Cardiology (Cardiology) [name], MD as Consulting Physician (Cardiology) Cammie Sickle, MD as Consulting Physician (Hematology and Oncology)   Cancer Staging  No matching staging information was found for the patient.   Oncology History Overview Note  Nov 2017- completed IM RT radiation therapy to his prostate and pelvic nodes for Gleason 7 (4+3) adenocarcinoma the prostate presenting the PSA of 7.8.  # JAN 2019- METASTATIC PROSTATE CA to Bone [low volume met on bone scan; CT- NED]; PSA ~50; March 25th 2019- Lupron 45 mg IM [Urology q 51M]; June 26, 2018-Eligard every 3 months[intolerance to Lupron/question A. Fib/injection site pain  # May 23rd Zytiga 245m/day; no prednisone; STOPPED MARCH 17th 2021-repeated severe hypokalemia; Eligard  # Barretts- EGD/ colo [[place]2021; Dr.Byrnett].   # OBenita Stabile [place]]; Oct 2019-paroxysmal A.fib/not on eliquis; [Dr.Khan] -Dr.Arida ; Dizzy spells [Dr.Shah] s/p PPM [2023]  ------------------------------------------------------------------  DIAGNOSIS: [ JAN 2019]- Met- PROSTATE CANCER  STAGE:  IV       ;GOALS: PALLIATIVE     Prostate cancer ([place]    INTERVAL HISTORY: Patient is with his wife.  He is in a wheel chair.  Spoke to patient daughter over the phone.  JMaylene [email or web address].  male pleasant patient above history of metastatic prostate cancer on ADT is here for follow-up.  [facility].  Of note patient had recent surgery of his left posterior calcaneus approximately week [facility]  [facility]A showed bilateral PE.  Patient is currently on Eliquis.   In the interim he was evaluated by cardiology-taken off Plavix as the patient is currently on Eliquis.   Otherwise no worsening hot flashes or  joint pains. Patient has gained weight.  No difficulty with urination.  Review of Systems  Constitutional:  Negative for chills, diaphoresis, fever, malaise/fatigue and weight loss.  HENT:  Negative for nosebleeds and sore throat.   Eyes:  Negative for double vision.  Respiratory:  Negative for cough, hemoptysis, sputum production, shortness of breath and wheezing.   Cardiovascular:  Negative for chest pain, palpitations, orthopnea and leg swelling.  Gastrointestinal:  Negative for abdominal pain, blood in stool, constipation, diarrhea, heartburn, melena, nausea and vomiting.  Genitourinary:  Negative for dysuria, frequency and urgency.  Musculoskeletal:  Positive for myalgias. Negative for back pain and joint pain.  Skin: Negative.  Negative for itching and rash.  Neurological:  Negative for tingling, focal weakness, weakness and headaches.  Endo/Heme/Allergies:  Does not bruise/bleed easily.  Psychiatric/Behavioral:  Negative for depression. The patient is not nervous/anxious and does not have insomnia.     PAST MEDICAL HISTORY :  Past Medical History:  Diagnosis Date   (HFpEF) heart failure with preserved ejection fraction ([facility]    a. 05/2018 Echo: Nl EF, Gr1 DD, sev dil LA, mild MR/TR, mild PAH; b. 05/2019 Echo: EF 55-60%, no rwma, mild LVH. Nl PASP. Mildly dil LA. Triv MR. Mild AoV sclerosis w/o stenosis. Mildly dil Asc AO (343m.   Allergic rhinitis    Barrett's esophagus 10/21/2013   Bone cancer (HCC)    CKD (chronic kidney disease), stage III (HCC)    Colon polyp, hyperplastic    COPD (chronic obstructive pulmonary disease) (HCC)    mild COPD. former smoker   Coronary artery disease    a. 2003  s/p PCI ([facility]); b. 2004 s/p PCI x 2 ([facility]); c. 11/2017 Cath: LM nl, LAD mild dzs, LCX patent stent w/ 30% distal edge restenosis, RCA patent distal stent w/ 30% prox edge stenosis. Nl EF->Med Rx;   Diverticulosis    Fundic gland polyps of stomach, benign    Gastritis    GERD  (gastroesophageal reflux disease)    Hypercholesteremia    Hypertension    Prostate cancer ([place])    Prostatism    Reflux esophagitis    Renal stones    Skin cancer    left cheek/lesion excised   Sleep apnea    Tachycardia    a. 11/2017 admit w/ tachycardia/? afib-->no afib noted upon review (amio/OAC d/c'd).   TIA (transient ischemic attack)     PAST SURGICAL HISTORY :   Past Surgical History:  Procedure Laterality Date   CARDIAC CATHETERIZATION     COLONOSCOPY WITH PROPOFOL N/A 09/13/2015   Procedure: COLONOSCOPY WITH PROPOFOL;  Surgeon: Lollie Sails, MD;  Location: [facility] ENDOSCOPY;  Service: Endoscopy;  Laterality: N/A;   COLONOSCOPY WITH PROPOFOL N/A 05/07/2019   Procedure: COLONOSCOPY WITH PROPOFOL;  Surgeon: Robert Bellow, MD;  Location: [facility] ENDOSCOPY;  Service: Endoscopy;  Laterality: N/A;   CORONARY ANGIOPLASTY     ESOPHAGOGASTRODUODENOSCOPY (EGD) WITH PROPOFOL N/A 09/13/2015   Procedure: ESOPHAGOGASTRODUODENOSCOPY (EGD) WITH PROPOFOL;  Surgeon: Lollie Sails, MD;  Location: [facility] ENDOSCOPY;  Service: Endoscopy;  Laterality: N/A;   ESOPHAGOGASTRODUODENOSCOPY (EGD) WITH PROPOFOL N/A 12/28/2015   Procedure: ESOPHAGOGASTRODUODENOSCOPY (EGD) WITH PROPOFOL;  Surgeon: Lollie Sails, MD;  Location: [facility] ENDOSCOPY;  Service: Endoscopy;  Laterality: N/A;   ESOPHAGOGASTRODUODENOSCOPY (EGD) WITH PROPOFOL N/A 06/16/2016   Procedure: ESOPHAGOGASTRODUODENOSCOPY (EGD) WITH PROPOFOL;  Surgeon: Lollie Sails, MD;  Location[facility] ENDOSCOPY;  Service: Endoscopy;  Laterality: N/A;   ESOPHAGOGASTRODUODENOSCOPY (EGD) WITH PROPOFOL N/A 05/07/2019   Procedure: ESOPHAGOGASTRODUODENOSCOPY (EGD) WITH PROPOFOL;  Surgeon: Robert Bellow, MD;  Location: [facility] ENDOSCOPY;  Service: Endoscopy;  Laterality: N/A;   EYE SURGERY  2013   CATARACT EXTRACTION   GANGLION CYST EXCISION Right 05/18/2017   Procedure: REMOVAL GANGLION CYST ANKLE;  Surgeon: Samara Deist, DPM;  Location: [facility] ORS;   Service: Podiatry;  Laterality: Right;   heart cath stent     LEFT HEART CATH AND CORONARY ANGIOGRAPHY Right 12/03/2017   Procedure: Left Heart Cath and Coronary Angiography with possible coronary intervention;  Surgeon: Dionisio David, MD;  Location: [place] CV LAB;  Service: Cardiovascular;  Laterality: Right;   PACEMAKER IMPLANT N/A 04/04/2021   Procedure: PACEMAKER IMPLANT;  Surgeon: Deboraha Sprang, MD;  Location: [place] CV LAB;  Service: Cardiovascular;  Laterality: N/A;   PROSTATE BIOPSY     RIGHT/LEFT HEART CATH AND CORONARY ANGIOGRAPHY N/A 02/07/2021   Procedure: RIGHT/LEFT HEART CATH AND CORONARY ANGIOGRAPHY;  Surgeon: [name], MD;  Location: [place] CV LAB;  Service: Cardiovascular;  Laterality: N/A;   stents     multiple    FAMILY HISTORY :   Family History  Problem Relation Age of Onset   Cancer Mother        gastric and lung   Cancer Father        multiple myeloma   Stroke Father    Cancer Sister        leukemia   Cancer Brother        leukemia   Cancer Brother        kidney   Cancer Daughter  Uterine   Cancer Other        [place]ep[place] (Sister's Son): Prostate    SOCIAL HISTORY:   Social History   Tobacco Use   Smoking status: Former    Packs/day: 1.00    Years: 35.00    Total pack years: 35.00    Types: Cigarettes    Quit date: 03/06/1986    Years since quitting: 35.9   Smokeless tobacco: Former    Types: Nurse, children's Use: Never used  Substance Use Topics   Alcohol use: No   Drug use: No    ALLERGIES:  is allergic to atorvastatin.  MEDICATIONS:  Current Outpatient Medications  Medication Sig Dispense Refill   amLODipine (NORVASC) 2.5 MG tablet TAKE 1 TABLET BY MOUTH ONCE DAILY *REDUCED DOSE* 90 tablet 0   apixaban (ELIQUIS) 5 MG TABS tablet Take 10 mg by mouth 2 (two) times daily.     azelastine (ASTELIN) 0.1 % nasal spray      Calcium Carb-Cholecalciferol (CALCIUM + D3 PO) Take 1 tablet by mouth daily.      cholecalciferol (VITAMIN D) 1000 units tablet Take 1,000 Units by mouth daily.      ferrous sulfate 324 MG TBEC Take 324 mg by mouth daily.     furosemide (LASIX) 20 MG tablet TAKE 1 TABLET BY MOUTH ONCE DAILY 90 tablet 2   isosorbide mononitrate (IMDUR) 30 MG 24 hr tablet TAKE 1 TABLET BY MOUTH ONCE DAILY 90 tablet 2   Leuprolide Acetate, 3 Month, (ELIGARD) 22.5 MG injection Inject 22.5 mg into the skin every 4 (four) months.     losartan (COZAAR) 50 MG tablet TAKE 1 TABLET BY MOUTH ONCE DAILY 90 tablet 1   lovastatin (MEVACOR) 40 MG tablet TAKE 2 TABLETS BY MOUTH AT BEDTIME 180 tablet 1   metoprolol succinate (TOPROL-XL) 25 MG 24 hr tablet Take 1 tablet by mouth daily.     Multiple Vitamin (MULTI-VITAMINS) TABS Take 1 tablet by mouth daily.      pantoprazole (PROTONIX) 40 MG tablet Take 40 mg by mouth 2 (two) times daily.      clopidogrel (PLAVIX) 75 MG tablet Take 1 tablet (75 mg total) by mouth daily. (Patient not taking: Reported on 02/16/2022) 90 tablet 3   No current facility-administered medications for this visit.    PHYSICAL EXAMINATION: ECOG PERFORMANCE STATUS: 0 - Asymptomatic  BP 130/83 (BP Location: Left Arm, Patient Position: Sitting)   Pulse 76   Temp 98.3 F (36.8 C) (Tympanic)   Resp 18   Wt 221 lb 14.4 oz (100.7 kg)   BMI 31.84 kg/m   Filed Weights   02/16/22 1100  Weight: 221 lb 14.4 oz (100.7 kg)   Patient in wheelchair; left leg in the brace  Physical Exam HENT:     Head: Normocephalic and atraumatic.     Mouth/Throat:     Pharynx: No oropharyngeal exudate.  Eyes:     Pupils: Pupils are equal, round, and reactive to light.  Cardiovascular:     Rate and Rhythm: Normal rate and regular rhythm.  Pulmonary:     Effort: No respiratory distress.     Breath sounds: No wheezing.  Abdominal:     General: Bowel sounds are normal. There is no distension.     Palpations: Abdomen is soft. There is no mass.     Tenderness: There is no abdominal tenderness.  There is no guarding or rebound.  Musculoskeletal:  General: No tenderness. Normal range of motion.     Cervical back: Normal range of motion and neck supple.  Skin:    General: Skin is warm.  Neurological:     Mental Status: He is alert and oriented to person, place, and time.  Psychiatric:        Mood and Affect: Affect normal.     LABORATORY DATA:  I have reviewed the data as listed    Component Value Date/Time   NA 140 02/16/2022 1055   NA 141 02/04/2021 0810   NA 140 09/16/2013 0438   K 4.3 02/16/2022 1055   K 4.2 09/16/2013 0438   CL 105 02/16/2022 1055   CL 109 (H) 09/16/2013 0438   CO2 26 02/16/2022 1055   CO2 24 09/16/2013 0438   GLUCOSE 99 02/16/2022 1055   GLUCOSE 101 (H) 09/16/2013 0438   BUN 21 02/16/2022 1055   BUN 18 02/04/2021 0810   BUN 20 (H) 09/16/2013 0438   CREATININE 1.15 02/16/2022 1055   CREATININE 1.28 09/16/2013 0438   CALCIUM 9.4 02/16/2022 1055   CALCIUM 8.3 (L) 09/16/2013 0438   PROT 7.3 02/16/2022 1055   PROT 6.7 09/15/2013 0755   ALBUMIN 3.8 02/16/2022 1055   ALBUMIN 3.5 09/15/2013 0755   AST 21 02/16/2022 1055   AST 37 09/15/2013 0755   ALT 15 02/16/2022 1055   ALT 28 09/15/2013 0755   ALKPHOS 65 02/16/2022 1055   ALKPHOS 44 (L) 09/15/2013 0755   BILITOT 0.7 02/16/2022 1055   BILITOT 0.5 09/15/2013 0755   GFRNONAA >60 02/16/2022 1055   GFRNONAA 54 (L) 09/16/2013 0438   GFRAA 59 (L) 10/07/2019 1305   GFRAA >60 09/16/2013 0438    No results found for: "SPEP", "UPEP"  Lab Results  Component Value Date   WBC 5.8 02/16/2022   NEUTROABS 3.4 02/16/2022   HGB 12.3 (L) 02/16/2022   HCT 36.5 (L) 02/16/2022   MCV 87.7 02/16/2022   PLT 288 02/16/2022      Chemistry      Component Value Date/Time   NA 140 02/16/2022 1055   NA 141 02/04/2021 0810   NA 140 09/16/2013 0438   K 4.3 02/16/2022 1055   K 4.2 09/16/2013 0438   CL 105 02/16/2022 1055   CL 109 (H) 09/16/2013 0438   CO2 26 02/16/2022 1055   CO2 24 09/16/2013  0438   BUN 21 02/16/2022 1055   BUN 18 02/04/2021 0810   BUN 20 (H) 09/16/2013 0438   CREATININE 1.15 02/16/2022 1055   CREATININE 1.28 09/16/2013 0438      Component Value Date/Time   CALCIUM 9.4 02/16/2022 1055   CALCIUM 8.3 (L) 09/16/2013 0438   ALKPHOS 65 02/16/2022 1055   ALKPHOS 44 (L) 09/15/2013 0755   AST 21 02/16/2022 1055   AST 37 09/15/2013 0755   ALT 15 02/16/2022 1055   ALT 28 09/15/2013 0755   BILITOT 0.7 02/16/2022 1055   BILITOT 0.5 09/15/2013 0755       RADIOGRAPHIC STUDIES: I have personally reviewed the radiological images as listed and agreed with the findings in the report. No results found.   ASSESSMENT & PLAN:  Prostate cancer ([place]) # Castrate sensitive-metastatic prostate cancer to the bone. Stage IV-on Eligard; Bone scan October 2020-improved;  SEP  PSA- <0.1.  Currently getting intermittent Eligard.  # Iron deficient anemia/CKD- III-[s/p EGD March 2021]--status post IV iron infusion x1 [FEB 2022]-today hemoglobin is 12.   Overall stable.  Continue oral iron.  #  Nov 26th, 2023-acute PE -lobar and segmental PE [[facility]]' - on Eliquis [no asprin/plavix]-secondary to recent left foot surgery.  Recommend minimum anticoagulation after 3 months.  However given history of paroxysmal A-fib-could consider longer-term anticoagulation.  However defer to cardiology.  I reached out to pharmacy social worker regarding patient's financial concerns regarding ongoing Eliquis prescription.  Patient is currently on Eliquis 5 mg twice daily.  #CKD stage III-GFR 49- .  Clinically stable.  # A. fib paroxysmal-sinus rhythm [dizzy spells] a.fib-s/p ppm-currently off Plavix and aspirin -see above.  Defer to cardiology regarding longer-term anticoagulation.  # DISPOSITION:  # RE-SCHEDULE Follow up in 1st week of March 2024 MD;labs- cbc.cmp/iron studies; ferritin; PSA eligard; possible venofer- Dr.B  Cc; Dr.Scott/     Orders Placed This Encounter  Procedures   CBC with  Differential/Platelet    Standing Status:   Standing    Number of Occurrences:   1    Standing Expiration Date:   02/17/2023   Comprehensive metabolic panel    Standing Status:   Standing    Number of Occurrences:   1    Standing Expiration Date:   02/17/2023   PSA    Standing Status:   Standing    Number of Occurrences:   1    Standing Expiration Date:   02/17/2023   All questions were answered. The patient knows to call the clinic with any problems, questions or concerns.      Cammie Sickle, MD 02/16/2022 3:00 PM

## 2022-02-20 ENCOUNTER — Telehealth: Payer: Self-pay | Admitting: Cardiovascular Disease

## 2022-02-20 ENCOUNTER — Other Ambulatory Visit: Payer: Self-pay

## 2022-02-20 MED ORDER — APIXABAN 5 MG PO TABS: 10.0000 mg | ORAL_TABLET | Freq: Two times a day (BID) | ORAL | 1 refills | Status: DC

## 2022-02-20 NOTE — Telephone Encounter (Signed)
*  STAT* If patient is at the pharmacy, call can be transferred to refill team.   1. Which medications need to be refilled? (please list name of each medication and dose if known)   apixaban (ELIQUIS) 5 MG TABS tablet    2. Which pharmacy/location (including street and city if local pharmacy) is medication to be sent to?[place] they need a 30 day or 90 day supply?  90 day

## 2022-02-20 NOTE — Telephone Encounter (Signed)
Clarified prescription information.  Pt diagnosed with PE on Dec 2 and ordered Eliquis 10 mg bid x 7d then 5 mg bid.  Prescription called in today to pharmacy stated 10 mg bid.  Clarified that should be 5 mg bid, as he should have already had his 7 days at 10 mg dose.    She notes that prescription was filled at another pharmacy (insurance would not allow her to fill today).  She will place on hold until patient calls for medication.

## 2022-02-20 NOTE — Telephone Encounter (Signed)
Please review

## 2022-02-20 NOTE — Telephone Encounter (Signed)
New Message:     Pharmacist wants to verify the directions on patient's Eliquis please.    Pt c/o medication issue:  1. Name of Medication: Eliquis  2. How are you currently taking this medication (dosage and times per day)? How is patient supposed to take his Eliquis  3. Are you having a reaction (difficulty breathing--STAT)?   4. What is your medication issue? Directions on Eliquis

## 2022-02-20 NOTE — Progress Notes (Signed)
Called and spoke with patient about concern of the cost of Eliquis. He received his first month free, but states he is running low on medication. He is not sure what the cost of the medication is at this time. I encouraged him to speak with his pharmacy and find out monthly cost. I let him know if he has a high out of pocket we could apply for free medication, but this would take some time to get approval. He is going to speak with his wife, and have her call pharmacy. Provided him with my phone number, he will call me back with information.

## 2022-02-20 NOTE — Telephone Encounter (Signed)
Prescription refill request for Eliquis received. Indication:afib Last office visit:12/23 Scr:1.1 Age: 86 Weight:100.7 kg  Prescription refilled

## 2022-02-22 NOTE — Progress Notes (Signed)
45 min would not be enough   we can loop him if you thnk the burden may be longer \

## 2022-03-02 ENCOUNTER — Other Ambulatory Visit: Payer: Self-pay | Admitting: Cardiovascular Disease

## 2022-03-02 ENCOUNTER — Encounter: Payer: Self-pay | Admitting: Internal Medicine

## 2022-03-02 DIAGNOSIS — I2699 Other pulmonary embolism without acute cor pulmonale: Secondary | ICD-10-CM | POA: Insufficient documentation

## 2022-03-02 MED ORDER — METOPROLOL SUCCINATE ER 25 MG PO TB24: 25.0000 mg | ORAL_TABLET | Freq: Every day | ORAL | 0 refills | Status: DC

## 2022-03-02 NOTE — Telephone Encounter (Signed)
*  STAT* If patient is at the pharmacy, call can be transferred to refill team.   1. Which medications need to be refilled? (please list name of each medication and dose if known)   metoprolol succinate (TOPROL-XL) 25 MG 24 hr tablet    2. Which pharmacy/location (including street and city if local pharmacy) is medication to be sent to?  [place] they need a 30 day or 90 day supply? [place]

## 2022-03-02 NOTE — Assessment & Plan Note (Signed)
Continue protonix  

## 2022-03-02 NOTE — Assessment & Plan Note (Addendum)
Recent - PE - diagnosed during recent hospitalization.  On eliquis.  Off plavix.  Continue eliquis. Discussed the need for f/u with hematology - duration of eliquis.  History of afib - may need more long term treatment - eliquis.  F/u labs - including cbc - to be drawn today.

## 2022-03-02 NOTE — Assessment & Plan Note (Signed)
Recently admitted with RSV.  Cough is better.  Breathing better.  Follow.

## 2022-03-02 NOTE — Assessment & Plan Note (Signed)
Evaluated by [facility].  Evaluated 01/2020.  Stable.  Recommended f/u scanning in 2 years.  

## 2022-03-02 NOTE — Assessment & Plan Note (Signed)
Dr Arida - 01/12/22 -  Cardiac catheterization in December showed patent stents with no obstructive disease. Continue plavix.  Aspirin stopped.  

## 2022-03-02 NOTE — Assessment & Plan Note (Addendum)
Previously saw GI. Continue PPI.  Elected to hold on EGD.  Follow.  

## 2022-03-02 NOTE — Assessment & Plan Note (Signed)
Saw [facility] ([place]) 01/2022 - recommended f/u aortic duplex in 12 months.  

## 2022-03-02 NOTE — Assessment & Plan Note (Signed)
Continue lovastatin.  Follow lipid panel and liver function tests.   

## 2022-03-02 NOTE — Assessment & Plan Note (Signed)
Carotid ultrasound 01/2021 - 1-39% bilaterally.  

## 2022-03-02 NOTE — Assessment & Plan Note (Signed)
Follow met b and a1c.  

## 2022-03-02 NOTE — Assessment & Plan Note (Signed)
Breathing stable.

## 2022-03-02 NOTE — Assessment & Plan Note (Signed)
Followed by cardiology.  ECHO - 05/2019 - EF 55-60%.  Continues on losartan, imdur and amlodipine.  Blood pressure doing well.  Stable.  

## 2022-03-02 NOTE — Assessment & Plan Note (Signed)
GFR improved recent check.  Continue to avoid antiinflammatories. Follow metabolic panel.  

## 2022-03-02 NOTE — Assessment & Plan Note (Signed)
Blood pressure as outlined.  On imdur, amlodipine and losartan.  Continue to follow pressure.  Continue to monitor metabolic panel.   

## 2022-03-02 NOTE — Assessment & Plan Note (Signed)
Followed by hematology.  Has received IV iron.  Follow.  

## 2022-03-02 NOTE — Assessment & Plan Note (Signed)
Appears to be in SR today.  Follow.  

## 2022-03-13 ENCOUNTER — Telehealth: Payer: Self-pay | Admitting: Cardiovascular Disease

## 2022-03-13 NOTE — Telephone Encounter (Signed)
Pt c/o medication issue:  1. Name of Medication: apixaban (ELIQUIS) 5 MG TABS tablet   2. How are you currently taking this medication (dosage and times per day)?  Take 2 tablets (10 mg total) by mouth 2 (two) times daily.  3. Are you having a reaction (difficulty breathing--STAT)? No   4. What is your medication issue? Pharmacy is said that dosage and times are wrong. Please call back to discuss

## 2022-03-13 NOTE — Telephone Encounter (Signed)
Call placed to the patient to clarify how much of the Eliquis he has been taking. The patient was unavailable. The wife stated that she thinks he is still taking 10 mg bid. She will call back when she gets home.  Message sent to pharmd.

## 2022-03-14 MED ORDER — APIXABAN 5 MG PO TABS: 5.0000 mg | ORAL_TABLET | Freq: Two times a day (BID) | ORAL | 1 refills | Status: DC

## 2022-03-14 NOTE — Telephone Encounter (Signed)
2 month supply of Eliquis has been sent to [facility]

## 2022-03-14 NOTE — Telephone Encounter (Signed)
Called and spoke with the wife. Current prescription for Eliquis says 10 mg bid. This was for 7 days only. The wife stated that the patient is currently taking 5 mg bid. Refill will be sent with correct dose.

## 2022-03-14 NOTE — Telephone Encounter (Signed)
Pt c/o medication issue:  1. Name of Medication:  Eliquis  2. How are you currently taking this medication (dosage and times per day)?   3. Are you having a reaction (difficulty breathing--STAT)?   4. What is your medication issue?   Patient's wife is following up, stating that the patient has been taking Eliquis 5 MG twice daily, once in the morning and once in the evening.

## 2022-03-14 NOTE — Telephone Encounter (Signed)
Spoke with wife patient will be out of eliquis this evening. He is taking '5mg'$  twice daily. Shall I refill the eliquis?

## 2022-03-14 NOTE — Telephone Encounter (Signed)
Continue Eliquis 5 mg twice daily at least until his next follow-up with me.  Will make a decision at that time whether to continue or not based on frequency of atrial fibrillation.

## 2022-03-22 ENCOUNTER — Telehealth: Payer: Self-pay | Admitting: Cardiovascular Disease

## 2022-03-22 NOTE — Telephone Encounter (Signed)
Pt wife called in about previous phone note, she states that she wanted 90 day supply because it cheaper but the phone note said 2 month supply to last until his appt in March. She stated they only received a one month supply please advise.

## 2022-03-22 NOTE — Telephone Encounter (Signed)
Wife called stating pt only received a 30 day supply of Eliquis. Per chart review, MD recommended continuing Eliquis 5 mg BID until next follow up appointment on 3/8.  Wife informed a 30 days supply with 1 refills was sent to pharmacy to last until next appointment. At that time MD will determine if pt should continue. Wife verbalized understanding.

## 2022-03-24 ENCOUNTER — Ambulatory Visit: Payer: Medicare HMO | Admitting: Internal Medicine

## 2022-03-24 ENCOUNTER — Other Ambulatory Visit: Payer: Medicare HMO

## 2022-03-24 ENCOUNTER — Ambulatory Visit: Payer: Medicare HMO

## 2022-04-11 ENCOUNTER — Ambulatory Visit: Payer: Medicare HMO

## 2022-04-11 DIAGNOSIS — I495 Sick sinus syndrome: Secondary | ICD-10-CM | POA: Diagnosis not present

## 2022-04-11 LAB — CUP PACEART REMOTE DEVICE CHECK
Battery Remaining Longevity: 151 mo
Battery Voltage: 3.06 V
Brady Statistic AP VP Percent: 0.18 %
Brady Statistic AP VS Percent: 91.28 %
Brady Statistic AS VP Percent: 0.07 %
Brady Statistic AS VS Percent: 8.48 %
Brady Statistic RA Percent Paced: 91.63 %
Brady Statistic RV Percent Paced: 0.27 %
Date Time Interrogation Session: 20240206033802
Implantable Lead Connection Status: 753985
Implantable Lead Connection Status: 753985
Implantable Lead Implant Date: 20230130
Implantable Lead Implant Date: 20230130
Implantable Lead Location: 753859
Implantable Lead Location: 753860
Implantable Lead Model: 3830
Implantable Lead Model: 5076
Implantable Pulse Generator Implant Date: 20230130
Lead Channel Impedance Value: 323 Ohm
Lead Channel Impedance Value: 437 Ohm
Lead Channel Impedance Value: 494 Ohm
Lead Channel Impedance Value: 589 Ohm
Lead Channel Pacing Threshold Amplitude: 0.75 V
Lead Channel Pacing Threshold Amplitude: 0.875 V
Lead Channel Pacing Threshold Pulse Width: 0.4 ms
Lead Channel Pacing Threshold Pulse Width: 0.4 ms
Lead Channel Sensing Intrinsic Amplitude: 2.375 mV
Lead Channel Sensing Intrinsic Amplitude: 2.375 mV
Lead Channel Sensing Intrinsic Amplitude: 2.625 mV
Lead Channel Sensing Intrinsic Amplitude: 2.625 mV
Lead Channel Setting Pacing Amplitude: 1.75 V
Lead Channel Setting Pacing Amplitude: 2 V
Lead Channel Setting Pacing Pulse Width: 0.4 ms
Lead Channel Setting Sensing Sensitivity: 0.9 mV
Zone Setting Status: 755011

## 2022-04-25 ENCOUNTER — Other Ambulatory Visit (INDEPENDENT_AMBULATORY_CARE_PROVIDER_SITE_OTHER): Payer: Medicare HMO

## 2022-04-25 DIAGNOSIS — E7800 Pure hypercholesterolemia, unspecified: Secondary | ICD-10-CM

## 2022-04-25 DIAGNOSIS — R739 Hyperglycemia, unspecified: Secondary | ICD-10-CM

## 2022-04-25 DIAGNOSIS — I10 Essential (primary) hypertension: Secondary | ICD-10-CM | POA: Diagnosis not present

## 2022-04-25 LAB — BASIC METABOLIC PANEL
BUN: 20 mg/dL (ref 6–23)
CO2: 26 mEq/L (ref 19–32)
Calcium: 9.5 mg/dL (ref 8.4–10.5)
Chloride: 105 mEq/L (ref 96–112)
Creatinine, Ser: 1.33 mg/dL (ref 0.40–1.50)
GFR: 49.12 mL/min — ABNORMAL LOW (ref >60.00)
Glucose, Bld: 109 mg/dL — ABNORMAL HIGH (ref 70–99)
Potassium: 4 mEq/L (ref 3.5–5.1)
Sodium: 140 mEq/L (ref 135–145)

## 2022-04-25 LAB — HEPATIC FUNCTION PANEL
ALT: 11 U/L (ref 0–53)
AST: 16 U/L (ref 0–37)
Albumin: 4 g/dL (ref 3.5–5.2)
Alkaline Phosphatase: 52 U/L (ref 39–117)
Bilirubin, Direct: 0.2 mg/dL (ref 0.0–0.3)
Total Bilirubin: 0.6 mg/dL (ref 0.2–1.2)
Total Protein: 6.7 g/dL (ref 6.0–8.3)

## 2022-04-25 LAB — HEMOGLOBIN A1C: Hgb A1c MFr Bld: 5.7 % (ref 4.6–6.5)

## 2022-04-25 LAB — LIPID PANEL
Cholesterol: 125 mg/dL (ref 0–200)
HDL: 32.9 mg/dL — ABNORMAL LOW (ref >39.00)
LDL Cholesterol: 68 mg/dL (ref 0–99)
NonHDL: 92.44
Total CHOL/HDL Ratio: 4
Triglycerides: 124 mg/dL (ref 0.0–149.0)
VLDL: 24.8 mg/dL (ref 0.0–40.0)

## 2022-04-27 ENCOUNTER — Ambulatory Visit: Payer: Medicare HMO | Admitting: Internal Medicine

## 2022-04-27 ENCOUNTER — Encounter: Payer: Self-pay | Admitting: Internal Medicine

## 2022-04-27 VITALS — BP 118/70 | HR 73 | Temp 98.2°F | Resp 16 | Ht 70.0 in | Wt 224.0 lb

## 2022-04-27 DIAGNOSIS — I4891 Unspecified atrial fibrillation: Secondary | ICD-10-CM

## 2022-04-27 DIAGNOSIS — I714 Abdominal aortic aneurysm, without rupture, unspecified: Secondary | ICD-10-CM | POA: Diagnosis not present

## 2022-04-27 DIAGNOSIS — I10 Essential (primary) hypertension: Secondary | ICD-10-CM | POA: Diagnosis not present

## 2022-04-27 DIAGNOSIS — D649 Anemia, unspecified: Secondary | ICD-10-CM

## 2022-04-27 DIAGNOSIS — E7800 Pure hypercholesterolemia, unspecified: Secondary | ICD-10-CM

## 2022-04-27 DIAGNOSIS — I5032 Chronic diastolic (congestive) heart failure: Secondary | ICD-10-CM

## 2022-04-27 DIAGNOSIS — I724 Aneurysm of artery of lower extremity: Secondary | ICD-10-CM

## 2022-04-27 DIAGNOSIS — G4730 Sleep apnea, unspecified: Secondary | ICD-10-CM

## 2022-04-27 DIAGNOSIS — R739 Hyperglycemia, unspecified: Secondary | ICD-10-CM

## 2022-04-27 DIAGNOSIS — C61 Malignant neoplasm of prostate: Secondary | ICD-10-CM

## 2022-04-27 DIAGNOSIS — J449 Chronic obstructive pulmonary disease, unspecified: Secondary | ICD-10-CM

## 2022-04-27 DIAGNOSIS — N1831 Chronic kidney disease, stage 3a: Secondary | ICD-10-CM

## 2022-04-27 DIAGNOSIS — K219 Gastro-esophageal reflux disease without esophagitis: Secondary | ICD-10-CM

## 2022-04-27 DIAGNOSIS — K2270 Barrett's esophagus without dysplasia: Secondary | ICD-10-CM

## 2022-04-27 DIAGNOSIS — I6523 Occlusion and stenosis of bilateral carotid arteries: Secondary | ICD-10-CM

## 2022-04-27 DIAGNOSIS — I25119 Atherosclerotic heart disease of native coronary artery with unspecified angina pectoris: Secondary | ICD-10-CM

## 2022-04-27 DIAGNOSIS — R42 Dizziness and giddiness: Secondary | ICD-10-CM

## 2022-04-27 NOTE — Progress Notes (Signed)
Subjective:    Patient ID: Nathaniel Thompson, male    DOB: Mar 27, 1935, 87 y.o.   MRN: [id number]  Patient here for  Chief Complaint  Patient presents with   Medical Management of Chronic Issues    HPI Here to follow up regarding afib, CKD, hypertension and hypercholesterolemia.  Recent left calcaneal fracture.  Followed by Dr Vickki Muff.  Last evaluated 04/20/22. Doing better.  No chest pain.  Breathing stable.  Eating.  No abdominal pain or bowel change reported.  Has f/u with cardiology 05/12/22.  Overall feels better.     Past Medical History:  Diagnosis Date   (HFpEF) heart failure with preserved ejection fraction ([place])    a. 05/2018 Echo: Nl EF, Gr1 DD, sev dil LA, mild MR/TR, mild PAH; b. 05/2019 Echo: EF 55-60%, no rwma, mild LVH. Nl PASP. Mildly dil LA. Triv MR. Mild AoV sclerosis w/o stenosis. Mildly dil Asc AO (69m).   Allergic rhinitis    Barrett's esophagus 10/21/2013   Bone cancer (HCC)    CKD (chronic kidney disease), stage III (HCC)    Colon polyp, hyperplastic    COPD (chronic obstructive pulmonary disease) (HCC)    mild COPD. former smoker   Coronary artery disease    a. 2003 s/p PCI ([facility]); b. 2004 s/p PCI x 2 ([facility]); c. 11/2017 Cath: LM nl, LAD mild dzs, LCX patent stent w/ 30% distal edge restenosis, RCA patent distal stent w/ 30% prox edge stenosis. Nl EF->Med Rx;   Diverticulosis    Fundic gland polyps of stomach, benign    Gastritis    GERD (gastroesophageal reflux disease)    Hypercholesteremia    Hypertension    [place]    Prostatism    Reflux esophagitis    Renal stones    Skin cancer    left cheek/lesion excised   Sleep apnea    Tachycardia    a. 11/2017 admit w/ tachycardia/? afib-->no afib noted upon review (amio/OAC d/c'd).   TIA (transient ischemic attack)    Past Surgical History:  Procedure Laterality Date   CARDIAC CATHETERIZATION     COLONOSCOPY WITH PROPOFOL N/A 09/13/2015   Procedure: COLONOSCOPY WITH PROPOFOL;  Surgeon: MLollie Sails MD;  [facility]  Service: Endoscopy;  Laterality: N/A;   COLONOSCOPY WITH PROPOFOL N/A 05/07/2019   Procedure: COLONOSCOPY WITH PROPOFOL;  Surgeon: BRobert Bellow MD;  Location: [facility] ENDOSCOPY;  Service: Endoscopy;  Laterality: N/A;   CORONARY ANGIOPLASTY     ESOPHAGOGASTRODUODENOSCOPY (EGD) WITH PROPOFOL N/A 09/13/2015   Procedure: ESOPHAGOGASTRODUODENOSCOPY (EGD) WITH PROPOFOL;  Surgeon: MLollie Sails MD;  [facility]  Service: Endoscopy;  Laterality: N/A;   ESOPHAGOGASTRODUODENOSCOPY (EGD) WITH PROPOFOL N/A 12/28/2015   Procedure: ESOPHAGOGASTRODUODENOSCOPY (EGD) WITH PROPOFOL;  Surgeon: MLollie Sails MD;  [facility];  Service: Endoscopy;  Laterality: N/A;   ESOPHAGOGASTRODUODENOSCOPY (EGD) WITH PROPOFOL N/A 06/16/2016   Procedure: ESOPHAGOGASTRODUODENOSCOPY (EGD) WITH PROPOFOL;  Surgeon: MLollie Sails MD;  [facility];  Service: Endoscopy;  Laterality: N/A;   ESOPHAGOGASTRODUODENOSCOPY (EGD) WITH PROPOFOL N/A 05/07/2019   Procedure: ESOPHAGOGASTRODUODENOSCOPY (EGD) WITH PROPOFOL;  Surgeon: BRobert Bellow MD;  Location: [facility] ENDOSCOPY;  Service: Endoscopy;  Laterality: N/A;   EYE SURGERY  2013   CATARACT EXTRACTION   GANGLION CYST EXCISION Right 05/18/2017   Procedure: REMOVAL GANGLION CYST ANKLE;  Surgeon: FSamara Deist DPM;  Location: [facility];  Service: Podiatry;  Laterality: Right;   heart cath stent     LEFT  HEART CATH AND CORONARY ANGIOGRAPHY Right 12/03/2017   Procedure: Left Heart Cath and Coronary Angiography with possible coronary intervention;  Surgeon: Dionisio David, MD;  Location: [place] CV LAB;  Service: Cardiovascular;  Laterality: Right;   PACEMAKER IMPLANT N/A 04/04/2021   Procedure: PACEMAKER IMPLANT;  Surgeon: Deboraha Sprang, MD;  Location: [place] CV LAB;  Service: Cardiovascular;  Laterality: N/A;   PROSTATE BIOPSY     RIGHT/LEFT HEART CATH AND CORONARY ANGIOGRAPHY N/A  02/07/2021   Procedure: RIGHT/LEFT HEART CATH AND CORONARY ANGIOGRAPHY;  Surgeon: [name], MD;  Location: [place] CV LAB;  Service: Cardiovascular;  Laterality: N/A;   stents     multiple   Family History  Problem Relation Age of Onset   Cancer Mother        gastric and lung   Cancer Father        multiple myeloma   Stroke Father    Cancer Sister        leukemia   Cancer Brother        leukemia   Cancer Brother        kidney   Cancer Daughter        Uterine   Cancer Other        [place] (Sister's Son): Prostate   Social History   Socioeconomic History   Marital status: Married    Spouse name: Not on file   Number of children: Not on file   Years of education: Not on file   Highest education level: Not on file  Occupational History   Not on file  Tobacco Use   Smoking status: Former    Packs/day: 1.00    Years: 35.00    Total pack years: 35.00    Types: Cigarettes    Quit date: 03/06/1986    Years since quitting: 36.1   Smokeless tobacco: Former    Types: Nurse, children's Use: Never used  Substance and Sexual Activity   Alcohol use: No   Drug use: No   Sexual activity: Not Currently  Other Topics Concern   Not on file  Social History Narrative   Not on file   Social Determinants of Health   Financial Resource Strain: Low Risk  (10/04/2021)   Overall Financial Resource Strain (CARDIA)    Difficulty of Paying Living Expenses: Not hard at all  Food Insecurity: No Food Insecurity (10/04/2021)   Hunger Vital Sign    Worried About Running Out of Food in the Last Year: Never true    [place] in the Last Year: Never true  Transportation Needs: No Transportation Needs (10/04/2021)   PRAPARE - Hydrologist (Medical): No    Lack of Transportation (Non-Medical): No  Physical Activity: Insufficiently Active (10/04/2021)   Exercise Vital Sign    Days of Exercise per Week: 4 days    Minutes of Exercise per Session:  30 min  Stress: No Stress Concern Present (10/04/2021)   [place]    Feeling of Stress : Not at all  Social Connections: Unknown (10/04/2021)   Social Connection and Isolation Panel [NHANES]    Frequency of Communication with Friends and Family: Not on file    Frequency of Social Gatherings with Friends and Family: Not on file    Attends Religious Services: Not on file    Active Member of Clubs or Organizations: Not on  file    Attends Archivist Meetings: Not on file    Marital Status: Married     Review of Systems  Constitutional:  Negative for appetite change and unexpected weight change.  HENT:  Negative for congestion and sinus pressure.   Respiratory:  Negative for cough, chest tightness and shortness of breath.   Cardiovascular:  Negative for chest pain and palpitations.  Gastrointestinal:  Negative for abdominal pain, diarrhea, nausea and vomiting.  Genitourinary:  Negative for difficulty urinating and dysuria.  Musculoskeletal:  Negative for joint swelling and myalgias.  Skin:  Negative for color change and rash.  Neurological:  Negative for dizziness and headaches.  Psychiatric/Behavioral:  Negative for agitation and dysphoric mood.        Objective:     BP 118/70   Pulse 73   Temp 98.2 F (36.8 C)   Resp 16   Ht '5\' 10"'$  (1.778 m)   Wt 224 lb (101.6 kg)   SpO2 98%   BMI 32.14 kg/m  Wt Readings from Last 3 Encounters:  04/27/22 224 lb (101.6 kg)  02/16/22 221 lb 14.4 oz (100.7 kg)  02/16/22 209 lb 4.8 oz (94.9 kg)    Physical Exam Constitutional:      General: He is not in acute distress.    Appearance: Normal appearance. He is well-developed.  HENT:     Head: Normocephalic and atraumatic.     Right Ear: External ear normal.     Left Ear: External ear normal.  Eyes:     General: No scleral icterus.       Right eye: No discharge.        Left eye: No discharge.  Cardiovascular:      Rate and Rhythm: Normal rate and regular rhythm.  Pulmonary:     Effort: Pulmonary effort is normal. No respiratory distress.     Breath sounds: Normal breath sounds.  Abdominal:     General: Bowel sounds are normal.     Palpations: Abdomen is soft.     Tenderness: There is no abdominal tenderness.  Musculoskeletal:        General: No swelling or tenderness.     Cervical back: Neck supple. No tenderness.  Lymphadenopathy:     Cervical: No cervical adenopathy.  Skin:    Findings: No erythema or rash.  Neurological:     Mental Status: He is alert.  Psychiatric:        Mood and Affect: Mood normal.        Behavior: Behavior normal.      Outpatient Encounter Medications as of 04/27/2022  Medication Sig   amLODipine (NORVASC) 2.5 MG tablet TAKE 1 TABLET BY MOUTH ONCE DAILY *REDUCED DOSE*   apixaban (ELIQUIS) 5 MG TABS tablet Take 1 tablet (5 mg total) by mouth 2 (two) times daily.   azelastine (ASTELIN) 0.1 % nasal spray    Calcium Carb-Cholecalciferol (CALCIUM + D3 PO) Take 1 tablet by mouth daily.   cholecalciferol (VITAMIN D) 1000 units tablet Take 1,000 Units by mouth daily.    ferrous sulfate 324 MG TBEC Take 324 mg by mouth daily.   furosemide (LASIX) 20 MG tablet TAKE 1 TABLET BY MOUTH ONCE DAILY   isosorbide mononitrate (IMDUR) 30 MG 24 hr tablet TAKE 1 TABLET BY MOUTH ONCE DAILY   Leuprolide Acetate, 3 Month, (ELIGARD) 22.5 MG injection Inject 22.5 mg into the skin every 4 (four) months.   losartan (COZAAR) 50 MG tablet TAKE 1 TABLET BY MOUTH  ONCE DAILY   lovastatin (MEVACOR) 40 MG tablet TAKE 2 TABLETS BY MOUTH AT BEDTIME   metoprolol succinate (TOPROL-XL) 25 MG 24 hr tablet Take 1 tablet (25 mg total) by mouth daily.   Multiple Vitamin (MULTI-VITAMINS) TABS Take 1 tablet by mouth daily.    pantoprazole (PROTONIX) 40 MG tablet Take 40 mg by mouth 2 (two) times daily.    No facility-administered encounter medications on file as of 04/27/2022.     Lab Results   Component Value Date   WBC 5.8 02/16/2022   HGB 12.3 (L) 02/16/2022   HCT 36.5 (L) 02/16/2022   PLT 288 02/16/2022   GLUCOSE 109 (H) 04/25/2022   CHOL 125 04/25/2022   TRIG 124.0 04/25/2022   HDL 32.90 (L) 04/25/2022   LDLCALC 68 04/25/2022   ALT 11 04/25/2022   AST 16 04/25/2022   NA 140 04/25/2022   K 4.0 04/25/2022   CL 105 04/25/2022   CREATININE 1.33 04/25/2022   BUN 20 04/25/2022   CO2 26 04/25/2022   TSH 2.15 01/25/2022   PSA 5.47 (H) 08/03/2016   HGBA1C 5.7 04/25/2022    No results found.     Assessment & Plan:  Hypertension, essential Assessment & Plan: Blood pressure as outlined.  On imdur, amlodipine and losartan.  Continue to follow pressure.  Continue to monitor metabolic panel.    Orders: -     Basic metabolic panel; Future  Hypercholesterolemia Assessment & Plan: Continue lovastatin.  Follow lipid panel and liver function tests.    Orders: -     Lipid panel; Future -     Hepatic function panel; Future  Hyperglycemia Assessment & Plan: Follow met b and a1c.   Orders: -     Hemoglobin A1c; Future  Abdominal aortic aneurysm (AAA) without rupture, unspecified part [facility]) Assessment & Plan: Saw [facility] Arna Medici) 01/2022 - recommended f/u aortic duplex in 12 months.    Anemia, unspecified type Assessment & Plan: Followed by hematology.  Has received IV iron.  Follow.    Atrial fibrillation, unspecified type [facility]) Assessment & Plan: Appears to be in SR today.  Follow.    Barrett's esophagus without dysplasia Assessment & Plan: Previously saw GI. Continue PPI.  Elected to hold on EGD.  Follow.    Bilateral carotid artery stenosis Assessment & Plan: Carotid ultrasound 01/2021 - 1-39% bilaterally.    Chronic heart failure with preserved ejection fraction [facility]) Assessment & Plan: Followed by cardiology.  ECHO - 05/2019 - EF 55-60%.  Continues on losartan, imdur and amlodipine.  Blood pressure doing well.  Stable.    Stage 3a chronic  kidney disease ([place]) Assessment & Plan: GFR improved recent check.  Continue to avoid antiinflammatories. Follow metabolic panel.    Chronic obstructive pulmonary disease, unspecified COPD type ([place]) Assessment & Plan: Breathing stable.    Coronary artery disease involving native coronary artery of native heart with angina pectoris [facility]) Assessment & Plan: Dr Fletcher Anon - 01/12/22 -  Cardiac catheterization in December showed patent stents with no obstructive disease. Continue plavix.  Aspirin stopped.    Dizziness Assessment & Plan: Not a significant issue for him now.  S/p pacemaker placement and has down well.     GERD without esophagitis Assessment & Plan: Continue protonix.    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  Evaluated 01/2020.  Stable.  Recommended f/u scanning in 2 years.    Prostate cancer [facility]) Assessment & Plan: Seeing oncology.  On eligard.  Sleep apnea, unspecified type Assessment & Plan: Continue cpap.       Einar Pheasant, MD

## 2022-05-02 ENCOUNTER — Ambulatory Visit: Payer: Medicare HMO

## 2022-05-02 ENCOUNTER — Other Ambulatory Visit: Payer: Medicare HMO

## 2022-05-02 ENCOUNTER — Ambulatory Visit: Payer: Medicare HMO | Admitting: Internal Medicine

## 2022-05-07 ENCOUNTER — Encounter: Payer: Self-pay | Admitting: Internal Medicine

## 2022-05-07 NOTE — Assessment & Plan Note (Signed)
Seeing oncology.  On eligard.

## 2022-05-07 NOTE — Assessment & Plan Note (Signed)
Saw [facility] Arna Medici) 01/2022 - recommended f/u aortic duplex in 12 months.

## 2022-05-07 NOTE — Assessment & Plan Note (Signed)
Followed by cardiology.  ECHO - 05/2019 - EF 55-60%.  Continues on losartan, imdur and amlodipine.  Blood pressure doing well.  Stable.

## 2022-05-07 NOTE — Assessment & Plan Note (Signed)
Blood pressure as outlined.  On imdur, amlodipine and losartan.  Continue to follow pressure.  Continue to monitor metabolic panel.

## 2022-05-07 NOTE — Assessment & Plan Note (Signed)
Followed by hematology.  Has received IV iron.  Follow.

## 2022-05-07 NOTE — Assessment & Plan Note (Signed)
Appears to be in SR today.  Follow.

## 2022-05-07 NOTE — Assessment & Plan Note (Signed)
Evaluated by [facility].  Evaluated 01/2020.  Stable.  Recommended f/u scanning in 2 years.

## 2022-05-07 NOTE — Assessment & Plan Note (Signed)
Previously saw GI. Continue PPI.  Elected to hold on EGD.  Follow.

## 2022-05-07 NOTE — Assessment & Plan Note (Signed)
Continue protonix  

## 2022-05-07 NOTE — Assessment & Plan Note (Signed)
Not a significant issue for him now.  S/p pacemaker placement and has down well.

## 2022-05-07 NOTE — Assessment & Plan Note (Signed)
Continue lovastatin.  Follow lipid panel and liver function tests.

## 2022-05-07 NOTE — Assessment & Plan Note (Signed)
Breathing stable.

## 2022-05-07 NOTE — Assessment & Plan Note (Signed)
GFR improved recent check.  Continue to avoid antiinflammatories. Follow metabolic panel.

## 2022-05-07 NOTE — Assessment & Plan Note (Signed)
Carotid ultrasound 01/2021 - 1-39% bilaterally.

## 2022-05-07 NOTE — Assessment & Plan Note (Signed)
Follow met b and a1c.  

## 2022-05-07 NOTE — Assessment & Plan Note (Signed)
Dr Fletcher Anon - 01/12/22 -  Cardiac catheterization in December showed patent stents with no obstructive disease. Continue plavix.  Aspirin stopped.

## 2022-05-07 NOTE — Assessment & Plan Note (Signed)
Continue cpap.  

## 2022-05-08 ENCOUNTER — Other Ambulatory Visit: Payer: Self-pay

## 2022-05-08 DIAGNOSIS — D500 Iron deficiency anemia secondary to blood loss (chronic): Secondary | ICD-10-CM

## 2022-05-08 DIAGNOSIS — C61 Malignant neoplasm of prostate: Secondary | ICD-10-CM

## 2022-05-08 MED FILL — Iron Sucrose Inj 20 MG/ML (Fe Equiv): INTRAVENOUS | Qty: 10 | Status: AC

## 2022-05-09 ENCOUNTER — Inpatient Hospital Stay: Payer: Medicare HMO

## 2022-05-09 ENCOUNTER — Inpatient Hospital Stay: Payer: Medicare HMO | Admitting: Internal Medicine

## 2022-05-09 ENCOUNTER — Inpatient Hospital Stay: Payer: Medicare HMO | Attending: Internal Medicine

## 2022-05-09 ENCOUNTER — Encounter: Payer: Self-pay | Admitting: Internal Medicine

## 2022-05-09 ENCOUNTER — Other Ambulatory Visit: Payer: Self-pay | Admitting: *Deleted

## 2022-05-09 VITALS — BP 102/55 | HR 77 | Temp 98.6°F | Resp 18 | Wt 223.0 lb

## 2022-05-09 DIAGNOSIS — D509 Iron deficiency anemia, unspecified: Secondary | ICD-10-CM | POA: Diagnosis not present

## 2022-05-09 DIAGNOSIS — C7951 Secondary malignant neoplasm of bone: Secondary | ICD-10-CM | POA: Diagnosis not present

## 2022-05-09 DIAGNOSIS — D500 Iron deficiency anemia secondary to blood loss (chronic): Secondary | ICD-10-CM

## 2022-05-09 DIAGNOSIS — C61 Malignant neoplasm of prostate: Secondary | ICD-10-CM | POA: Diagnosis not present

## 2022-05-09 LAB — CBC WITH DIFFERENTIAL/PLATELET
Abs Immature Granulocytes: 0 10*3/uL (ref 0.00–0.07)
Basophils Absolute: 0 10*3/uL (ref 0.0–0.1)
Basophils Relative: 1 %
Eosinophils Absolute: 0.2 10*3/uL (ref 0.0–0.5)
Eosinophils Relative: 3 %
HCT: 36.1 % — ABNORMAL LOW (ref 39.0–52.0)
Hemoglobin: 12.3 g/dL — ABNORMAL LOW (ref 13.0–17.0)
Immature Granulocytes: 0 %
Lymphocytes Relative: 34 %
Lymphs Abs: 1.5 10*3/uL (ref 0.7–4.0)
MCH: 30.5 pg (ref 26.0–34.0)
MCHC: 34.1 g/dL (ref 30.0–36.0)
MCV: 89.6 fL (ref 80.0–100.0)
Monocytes Absolute: 0.5 10*3/uL (ref 0.1–1.0)
Monocytes Relative: 10 %
Neutro Abs: 2.3 10*3/uL (ref 1.7–7.7)
Neutrophils Relative %: 52 %
Platelets: 183 10*3/uL (ref 150–400)
RBC: 4.03 MIL/uL — ABNORMAL LOW (ref 4.22–5.81)
RDW: 13.2 % (ref 11.5–15.5)
WBC: 4.4 10*3/uL (ref 4.0–10.5)
nRBC: 0 % (ref 0.0–0.2)

## 2022-05-09 LAB — IRON AND TIBC
Iron: 119 ug/dL (ref 45–182)
Saturation Ratios: 32 % (ref 17.9–39.5)
TIBC: 368 ug/dL (ref 250–450)
UIBC: 249 ug/dL

## 2022-05-09 LAB — COMPREHENSIVE METABOLIC PANEL
ALT: 15 U/L (ref 0–44)
AST: 21 U/L (ref 15–41)
Albumin: 4 g/dL (ref 3.5–5.0)
Alkaline Phosphatase: 54 U/L (ref 38–126)
Anion gap: 7 (ref 5–15)
BUN: 25 mg/dL — ABNORMAL HIGH (ref 8–23)
CO2: 24 mmol/L (ref 22–32)
Calcium: 9.1 mg/dL (ref 8.9–10.3)
Chloride: 109 mmol/L (ref 98–111)
Creatinine, Ser: 1.37 mg/dL — ABNORMAL HIGH (ref 0.61–1.24)
GFR, Estimated: 51 mL/min — ABNORMAL LOW (ref >60)
Glucose, Bld: 91 mg/dL (ref 70–99)
Potassium: 4 mmol/L (ref 3.5–5.1)
Sodium: 140 mmol/L (ref 135–145)
Total Bilirubin: 0.6 mg/dL (ref 0.3–1.2)
Total Protein: 6.9 g/dL (ref 6.5–8.1)

## 2022-05-09 LAB — PSA: Prostatic Specific Antigen: <0.01 ng/mL (ref 0.00–4.00)

## 2022-05-09 LAB — FERRITIN: Ferritin: 35 ng/mL (ref 24–336)

## 2022-05-09 NOTE — Progress Notes (Signed)
Pt feels tired, weak at times. Has chronic pain in left knee and foot. Pt has a good appetite. Dyspnea with any exertion. Denies any side effects from eligard.

## 2022-05-09 NOTE — Assessment & Plan Note (Addendum)
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  Currently getting intermittent Eligard. Awaiting PSA from today.  Hold Eligard today.  If significantly elevated would recommend Eligard.  # Iron deficient anemia/CKD- III-[s/p EGD March 2021]--status post IV iron infusion x1 [FEB 2022]-today hemoglobin is 12.   Overall stable.  Continue oral iron.  # Nov 26th, 2023-acute PE -lobar and segmental PE [[facility]]' - on Eliquis [no asprin/plavix]-secondary to recent left foot surgery.  Recommend minimum anticoagulation after 3 months.  However given history of paroxysmal A-fib-could consider longer-term anticoagulation.  However defer to cardiology; awaiting re-eval next this week.  Patient is currently on Eliquis 5 mg twice daily.  #CKD stage III-GFR 50s.  Clinically stable.  # A. fib paroxysmal-sinus rhythm [dizzy spells] a.fib-s/p ppm-currently off Plavix and aspirin -see above.  Defer to cardiology regarding longer-term anticoagulation.   # DISPOSITION:  # HOLD eligard; HOLD venofer- # Follow up in 3 months-  MD; 2-3 days prior- labs- cbc.cmp/iron studies; ferritin; PSA eligard; possible venofer- Dr.B  Cc; Dr.Scott/

## 2022-05-09 NOTE — Progress Notes (Signed)
[place] OFFICE PROGRESS NOTE  Patient Care Team: Einar Pheasant, MD as PCP - General (Internal Medicine) [name], MD as PCP - Cardiology (Cardiology) [name], MD as Consulting Physician (Cardiology) Cammie Sickle, MD as Consulting Physician (Hematology and Oncology)   Cancer Staging  No matching staging information was found for the patient.   Oncology History Overview Note  Nov 2017- completed IM RT radiation therapy to his prostate and pelvic nodes for Gleason 7 (4+3) adenocarcinoma the prostate presenting the PSA of 7.8.  # JAN 2019- METASTATIC PROSTATE CA to Bone [low volume met on bone scan; CT- NED]; PSA ~50; March 25th 2019- Lupron 45 mg IM [Urology q 34M]; June 26, 2018-Eligard every 3 months[intolerance to Lupron/question A. Fib/injection site pain  # May 23rd Zytiga '250mg'$ /day; no prednisone; STOPPED MARCH 17th 2021-repeated severe hypokalemia; Eligard  # Barretts- EGD/ colo [place] 2021; Dr.Byrnett].   # [name], [place]]; Oct 2019-paroxysmal A.fib/not on eliquis; [Dr.Khan] -Dr.Arida ; Dizzy spells [Dr.Shah] s/p PPM [2023]  ------------------------------------------------------------------  DIAGNOSIS: [ JAN 2019]- Met- PROSTATE CANCER  STAGE:  IV       ;GOALS: PALLIATIVE     Prostate cancer ([place])    INTERVAL HISTORY: Patient is with his wife.  He is walking with a cane.   Nathaniel Thompson 87 y.o.  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  Patient has chronic mild shortness of breath.  Not any worse.  Chronic joint pains not any worse.  Denies any dizzy spells or falls.   Otherwise no worsening hot flashes or joint pains. Patient has gained weight.  No difficulty with urination.  Denies any blood in stools or black-colored stools.  Review of Systems  Constitutional:  Negative  for chills, diaphoresis, fever, malaise/fatigue and weight loss.  HENT:  Negative for nosebleeds and sore throat.   Eyes:  Negative for double vision.  Respiratory:  Negative for cough, hemoptysis, sputum production, shortness of breath and wheezing.   Cardiovascular:  Negative for chest pain, palpitations, orthopnea and leg swelling.  Gastrointestinal:  Negative for abdominal pain, blood in stool, constipation, diarrhea, heartburn, melena, nausea and vomiting.  Genitourinary:  Negative for dysuria, frequency and urgency.  Musculoskeletal:  Positive for myalgias. Negative for back pain and joint pain.  Skin: Negative.  Negative for itching and rash.  Neurological:  Negative for tingling, focal weakness, weakness and headaches.  Endo/Heme/Allergies:  Does not bruise/bleed easily.  Psychiatric/Behavioral:  Negative for depression. The patient is not nervous/anxious and does not have insomnia.     PAST MEDICAL HISTORY :  Past Medical History:  Diagnosis Date   (HFpEF) heart failure with preserved ejection fraction ([place])    a. 05/2018 Echo: Nl EF, Gr1 DD, sev dil LA, mild MR/TR, mild PAH; b. 05/2019 Echo: EF 55-60%, no rwma, mild LVH. Nl PASP. Mildly dil LA. Triv MR. Mild AoV sclerosis w/o stenosis. Mildly dil Asc AO (25m).   Allergic rhinitis    Barrett's esophagus 10/21/2013   Bone cancer (HCC)    CKD (chronic kidney disease), stage III (HCC)    Colon polyp, hyperplastic    COPD (chronic obstructive pulmonary disease) (HCC)    mild COPD. former smoker   Coronary artery disease    a. 2003 s/p PCI ([facility]); b. 2004 s/p PCI x 2 ([facility]); c. 11/2017 Cath: LM nl, LAD mild dzs, LCX  patent stent w/ 30% distal edge restenosis, RCA patent distal stent w/ 30% prox edge stenosis. Nl EF->Med Rx;   Diverticulosis    Fundic gland polyps of stomach, benign    Gastritis    GERD (gastroesophageal reflux disease)    Hypercholesteremia    Hypertension    Prostate cancer ([place])    Prostatism    Reflux  esophagitis    Renal stones    Skin cancer    left cheek/lesion excised   Sleep apnea    Tachycardia    a. 11/2017 admit w/ tachycardia/? afib-->no afib noted upon review (amio/OAC d/c'd).   TIA (transient ischemic attack)     PAST SURGICAL HISTORY :   Past Surgical History:  Procedure Laterality Date   CARDIAC CATHETERIZATION     COLONOSCOPY WITH PROPOFOL N/A 09/13/2015   Procedure: COLONOSCOPY WITH PROPOFOL;  Surgeon: Lollie Sails, MD;  Location: [facility] ENDOSCOPY;  Service: Endoscopy;  Laterality: N/A;   COLONOSCOPY WITH PROPOFOL N/A 05/07/2019   Procedure: COLONOSCOPY WITH PROPOFOL;  Surgeon: Robert Bellow, MD;  Location: [facility] ENDOSCOPY;  Service: Endoscopy;  Laterality: N/A;   CORONARY ANGIOPLASTY     ESOPHAGOGASTRODUODENOSCOPY (EGD) WITH PROPOFOL N/A 09/13/2015   Procedure: ESOPHAGOGASTRODUODENOSCOPY (EGD) WITH PROPOFOL;  Surgeon: Lollie Sails, MD;  Location: [facility] ENDOSCOPY;  Service: Endoscopy;  Laterality: N/A;   ESOPHAGOGASTRODUODENOSCOPY (EGD) WITH PROPOFOL N/A 12/28/2015   Procedure: ESOPHAGOGASTRODUODENOSCOPY (EGD) WITH PROPOFOL;  Surgeon: Lollie Sails, MD;  Location: [facility] ENDOSCOPY;  Service: Endoscopy;  Laterality: N/A;   ESOPHAGOGASTRODUODENOSCOPY (EGD) WITH PROPOFOL N/A 06/16/2016   Procedure: ESOPHAGOGASTRODUODENOSCOPY (EGD) WITH PROPOFOL;  Surgeon: Lollie Sails, MD;  Location: [facility] ENDOSCOPY;  Service: Endoscopy;  Laterality: N/A;   ESOPHAGOGASTRODUODENOSCOPY (EGD) WITH PROPOFOL N/A 05/07/2019   Procedure: ESOPHAGOGASTRODUODENOSCOPY (EGD) WITH PROPOFOL;  Surgeon: Robert Bellow, MD;  Location: [facility] ENDOSCOPY;  Service: Endoscopy;  Laterality: N/A;   EYE SURGERY  2013   CATARACT EXTRACTION   GANGLION CYST EXCISION Right 05/18/2017   Procedure: REMOVAL GANGLION CYST ANKLE;  Surgeon: Samara Deist, DPM;  Location: [facility] ORS;  Service: Podiatry;  Laterality: Right;   heart cath stent     LEFT HEART CATH AND CORONARY ANGIOGRAPHY Right 12/03/2017    Procedure: Left Heart Cath and Coronary Angiography with possible coronary intervention;  Surgeon: Dionisio David, MD;  Location: [place] CV LAB;  Service: Cardiovascular;  Laterality: Right;   PACEMAKER IMPLANT N/A 04/04/2021   Procedure: PACEMAKER IMPLANT;  Surgeon: Deboraha Sprang, MD;  Location: [place] CV LAB;  Service: Cardiovascular;  Laterality: N/A;   PROSTATE BIOPSY     RIGHT/LEFT HEART CATH AND CORONARY ANGIOGRAPHY N/A 02/07/2021   Procedure: RIGHT/LEFT HEART CATH AND CORONARY ANGIOGRAPHY;  Surgeon: [name], MD;  Location: [place] CV LAB;  Service: Cardiovascular;  Laterality: N/A;   stents     multiple    FAMILY HISTORY :   Family History  Problem Relation Age of Onset   Cancer Mother        gastric and lung   Cancer Father        multiple myeloma   Stroke Father    Cancer Sister        leukemia   Cancer Brother        leukemia   Cancer Brother        kidney   Cancer Daughter        Uterine   Cancer Other        [place] (Sister's Son):  Prostate    SOCIAL HISTORY:   Social History   Tobacco Use   Smoking status: Former    Packs/day: 1.00    Years: 35.00    Total pack years: 35.00    Types: Cigarettes    Quit date: 03/06/1986    Years since quitting: 36.2   Smokeless tobacco: Former    Types: Nurse, children's Use: Never used  Substance Use Topics   Alcohol use: No   Drug use: No    ALLERGIES:  is allergic to atorvastatin.  MEDICATIONS:  Current Outpatient Medications  Medication Sig Dispense Refill   amLODipine (NORVASC) 2.5 MG tablet TAKE 1 TABLET BY MOUTH ONCE DAILY *REDUCED DOSE* 90 tablet 0   apixaban (ELIQUIS) 5 MG TABS tablet Take 1 tablet (5 mg total) by mouth 2 (two) times daily. 60 tablet 1   Calcium Carb-Cholecalciferol (CALCIUM + D3 PO) Take 1 tablet by mouth daily.     cholecalciferol (VITAMIN D) 1000 units tablet Take 1,000 Units by mouth daily.      ferrous sulfate 324 MG TBEC Take 324 mg by mouth daily.      furosemide (LASIX) 20 MG tablet TAKE 1 TABLET BY MOUTH ONCE DAILY 90 tablet 2   isosorbide mononitrate (IMDUR) 30 MG 24 hr tablet TAKE 1 TABLET BY MOUTH ONCE DAILY 90 tablet 2   Leuprolide Acetate, 3 Month, (ELIGARD) 22.5 MG injection Inject 22.5 mg into the skin every 4 (four) months.     losartan (COZAAR) 50 MG tablet TAKE 1 TABLET BY MOUTH ONCE DAILY 90 tablet 1   lovastatin (MEVACOR) 40 MG tablet TAKE 2 TABLETS BY MOUTH AT BEDTIME 180 tablet 1   metoprolol succinate (TOPROL-XL) 25 MG 24 hr tablet Take 1 tablet (25 mg total) by mouth daily. 90 tablet 0   Multiple Vitamin (MULTI-VITAMINS) TABS Take 1 tablet by mouth daily.      pantoprazole (PROTONIX) 40 MG tablet Take 40 mg by mouth 2 (two) times daily.      azelastine (ASTELIN) 0.1 % nasal spray  (Patient not taking: Reported on 05/09/2022)     No current facility-administered medications for this visit.    PHYSICAL EXAMINATION: ECOG PERFORMANCE STATUS: 0 - Asymptomatic  BP (!) 102/55 (BP Location: Left Arm, Patient Position: Sitting)   Pulse 77   Temp 98.6 F (37 C) (Tympanic)   Resp 18   Wt 223 lb (101.2 kg)   SpO2 96%   BMI 32.00 kg/m   Filed Weights   05/09/22 1324  Weight: 223 lb (101.2 kg)   Patient in wheelchair; left leg in the brace  Physical Exam HENT:     Head: Normocephalic and atraumatic.     Mouth/Throat:     Pharynx: No oropharyngeal exudate.  Eyes:     Pupils: Pupils are equal, round, and reactive to light.  Cardiovascular:     Rate and Rhythm: Normal rate and regular rhythm.  Pulmonary:     Effort: No respiratory distress.     Breath sounds: No wheezing.  Abdominal:     General: Bowel sounds are normal. There is no distension.     Palpations: Abdomen is soft. There is no mass.     Tenderness: There is no abdominal tenderness. There is no guarding or rebound.  Musculoskeletal:        General: No tenderness. Normal range of motion.     Cervical back: Normal range of motion and neck supple.   Skin:  General: Skin is warm.  Neurological:     Mental Status: He is alert and oriented to person, place, and time.  Psychiatric:        Mood and Affect: Affect normal.     LABORATORY DATA:  I have reviewed the data as listed    Component Value Date/Time   NA 140 05/09/2022 1301   NA 141 02/04/2021 0810   NA 140 09/16/2013 0438   K 4.0 05/09/2022 1301   K 4.2 09/16/2013 0438   CL 109 05/09/2022 1301   CL 109 (H) 09/16/2013 0438   CO2 24 05/09/2022 1301   CO2 24 09/16/2013 0438   GLUCOSE 91 05/09/2022 1301   GLUCOSE 101 (H) 09/16/2013 0438   BUN 25 (H) 05/09/2022 1301   BUN 18 02/04/2021 0810   BUN 20 (H) 09/16/2013 0438   CREATININE 1.37 (H) 05/09/2022 1301   CREATININE 1.28 09/16/2013 0438   CALCIUM 9.1 05/09/2022 1301   CALCIUM 8.3 (L) 09/16/2013 0438   PROT 6.9 05/09/2022 1301   PROT 6.7 09/15/2013 0755   ALBUMIN 4.0 05/09/2022 1301   ALBUMIN 3.5 09/15/2013 0755   AST 21 05/09/2022 1301   AST 37 09/15/2013 0755   ALT 15 05/09/2022 1301   ALT 28 09/15/2013 0755   ALKPHOS 54 05/09/2022 1301   ALKPHOS 44 (L) 09/15/2013 0755   BILITOT 0.6 05/09/2022 1301   BILITOT 0.5 09/15/2013 0755   GFRNONAA 51 (L) 05/09/2022 1301   GFRNONAA 54 (L) 09/16/2013 0438   GFRAA 59 (L) 10/07/2019 1305   GFRAA >60 09/16/2013 0438    No results found for: "SPEP", "UPEP"  Lab Results  Component Value Date   WBC 4.4 05/09/2022   NEUTROABS 2.3 05/09/2022   HGB 12.3 (L) 05/09/2022   HCT 36.1 (L) 05/09/2022   MCV 89.6 05/09/2022   PLT 183 05/09/2022      Chemistry      Component Value Date/Time   NA 140 05/09/2022 1301   NA 141 02/04/2021 0810   NA 140 09/16/2013 0438   K 4.0 05/09/2022 1301   K 4.2 09/16/2013 0438   CL 109 05/09/2022 1301   CL 109 (H) 09/16/2013 0438   CO2 24 05/09/2022 1301   CO2 24 09/16/2013 0438   BUN 25 (H) 05/09/2022 1301   BUN 18 02/04/2021 0810   BUN 20 (H) 09/16/2013 0438   CREATININE 1.37 (H) 05/09/2022 1301   CREATININE 1.28 09/16/2013  0438      Component Value Date/Time   CALCIUM 9.1 05/09/2022 1301   CALCIUM 8.3 (L) 09/16/2013 0438   ALKPHOS 54 05/09/2022 1301   ALKPHOS 44 (L) 09/15/2013 0755   AST 21 05/09/2022 1301   AST 37 09/15/2013 0755   ALT 15 05/09/2022 1301   ALT 28 09/15/2013 0755   BILITOT 0.6 05/09/2022 1301   BILITOT 0.5 09/15/2013 0755       RADIOGRAPHIC STUDIES: I have personally reviewed the radiological images as listed and agreed with the findings in the report. No results found.   ASSESSMENT & PLAN:  Prostate cancer ([place]) # Castrate sensitive-metastatic prostate cancer to the bone. Stage IV-on Eligard; Bone scan October 2020-improved;  SEP 2023 PSA- <0.1.  Currently getting intermittent Eligard. Awaiting PSA from today.  Hold Eligard today.  If significantly elevated would recommend Eligard.  # Iron deficient anemia/CKD- III-[s/p EGD March 2021]--status post IV iron infusion x1 [FEB 2022]-today hemoglobin is 12.   Overall stable.  Continue oral iron.  # Nov 26th, 2023-acute  PE -lobar and segmental PE [[facility]]' - on Eliquis [no asprin/plavix]-secondary to recent left foot surgery.  Recommend minimum anticoagulation after 3 months.  However given history of paroxysmal A-fib-could consider longer-term anticoagulation.  However defer to cardiology; awaiting re-eval next this week.  Patient is currently on Eliquis 5 mg twice daily.  #CKD stage III-GFR 50s.  Clinically stable.  # A. fib paroxysmal-sinus rhythm [dizzy spells] a.fib-s/p ppm-currently off Plavix and aspirin -see above.  Defer to cardiology regarding longer-term anticoagulation.   # DISPOSITION:  # HOLD eligard; HOLD venofer- # Follow up in 3 months-  MD; 2-3 days prior- labs- cbc.cmp/iron studies; ferritin; PSA eligard; possible venofer- Dr.B  Cc; Dr.Scott/     No orders of the defined types were placed in this encounter.  All questions were answered. The patient knows to call the clinic with any problems, questions or concerns.       Cammie Sickle, MD 05/09/2022 2:02 PM

## 2022-05-12 ENCOUNTER — Encounter: Payer: Self-pay | Admitting: Cardiovascular Disease

## 2022-05-12 ENCOUNTER — Ambulatory Visit: Payer: Medicare HMO | Attending: Cardiovascular Disease | Admitting: Cardiovascular Disease

## 2022-05-12 VITALS — BP 128/60 | HR 80 | Ht 70.0 in | Wt 221.5 lb

## 2022-05-12 DIAGNOSIS — R001 Bradycardia, unspecified: Secondary | ICD-10-CM

## 2022-05-12 DIAGNOSIS — I10 Essential (primary) hypertension: Secondary | ICD-10-CM

## 2022-05-12 DIAGNOSIS — E785 Hyperlipidemia, unspecified: Secondary | ICD-10-CM

## 2022-05-12 DIAGNOSIS — I2510 Atherosclerotic heart disease of native coronary artery without angina pectoris: Secondary | ICD-10-CM | POA: Diagnosis not present

## 2022-05-12 DIAGNOSIS — I480 Paroxysmal atrial fibrillation: Secondary | ICD-10-CM

## 2022-05-12 DIAGNOSIS — I714 Abdominal aortic aneurysm, without rupture, unspecified: Secondary | ICD-10-CM

## 2022-05-12 DIAGNOSIS — I5032 Chronic diastolic (congestive) heart failure: Secondary | ICD-10-CM

## 2022-05-12 MED ORDER — APIXABAN 5 MG PO TABS: 5.0000 mg | ORAL_TABLET | Freq: Two times a day (BID) | ORAL | 1 refills | Status: DC

## 2022-05-12 NOTE — Progress Notes (Signed)
Cardiology Office Note   Date:  05/12/2022   ID:  Nathaniel Thompson, DOB [birth year], MRN [id number]  PCP:  Einar Pheasant, MD  Cardiologist:   Kathlyn Sacramento, MD   Chief Complaint  Patient presents with   3 month follow up     Patient c/o shortness of breath with over exertion. Medications reviewed by the patient verbally.         History of Present Illness: Nathaniel Thompson is a 87 y.o. male who presents for a follow-up visit regarding coronary artery disease.   He quit smoking in the 80s.  He reports having TIA in the past.  Other medical problems include metastatic prostate cancer, small abdominal aortic aneurysm followed by vascular surgery, chronic kidney disease, hyperlipidemia and essential hypertension.  He has known history of coronary artery disease status post stenting of the left circumflex and RCA.  He had initial PCI done in 2003 at [facility].  He had repeat PCI x2 at [facility] in 2004.   He was hospitalized in September 2019 with questionable tachycardia and atrial fibrillation.  Initially was placed on amiodarone and anticoagulation but all his EKGs revealed sinus rhythm at that time and thus both amiodarone and Eliquis were discontinued.     Echocardiogram in March 2020 showed normal LV systolic function with grade 1 diastolic dysfunction, severely dilated left atrium, mild mitral and tricuspid regurgitation and mild pulmonary hypertension.  He had worsening chest pain and shortness of breath in December 2022.  Cardiac catheterization showed patent stents in the left circumflex and right coronary artery with no obstructive coronary artery disease.  Right heart catheterization showed mildly elevated filling pressures, mild pulmonary hypertension and normal cardiac output.  The patient was noted to have intermittent bradycardia during cardiac catheterization with heart rate going to the low 40s.    He underwent outpatient monitor which showed frequent episodes of SVT.  He was  referred to EP and underwent pacemaker placement in January 2023.  In addition, he was placed on furosemide for diastolic heart failure.  He fell from a deer stand in late 2023 and fractured his left posterior calcaneus.  He was subsequently hospitalized at [facility]  with shortness of breath, cough and fever in the setting of his  displaced fracture of left posterior calcaneus.  He was found to have RSV and lobar and segmental pulmonary embolism.  His pulmonary embolism was not significant to require endovascular intervention.  He was treated with anticoagulation.  He was noted to have episodes of tachycardia and was found to be in atrial fibrillation with rapid ventricular response.  This lasted for about 45 minutes.  He was placed on small dose metoprolol.    He has been doing reasonably well with no chest pain.  He continues to have exertional dyspnea and is trying to exercise more.  He has mild bilateral lower extremity edema but his weight has been stable around 220 pounds.  No issues with anticoagulation.   Past Medical History:  Diagnosis Date   (HFpEF) heart failure with preserved ejection fraction ([place])    a. 05/2018 Echo: Nl EF, Gr1 DD, sev dil LA, mild MR/TR, mild PAH; b. 05/2019 Echo: EF 55-60%, no rwma, mild LVH. Nl PASP. Mildly dil LA. Triv MR. Mild AoV sclerosis w/o stenosis. Mildly dil Asc AO (95m).   Allergic rhinitis    Barrett's esophagus 10/21/2013   Bone cancer (HCC)    CKD (chronic kidney disease), stage III (HCC)    Colon  polyp, hyperplastic    COPD (chronic obstructive pulmonary disease) (HCC)    mild COPD. former smoker   Coronary artery disease    a. 2003 s/p PCI ([facility]); b. 2004 s/p PCI x 2 ([facility]); c. 11/2017 Cath: LM nl, LAD mild dzs, LCX patent stent w/ 30% distal edge restenosis, RCA patent distal stent w/ 30% prox edge stenosis. Nl EF->Med Rx;   Diverticulosis    Fundic gland polyps of stomach, benign    Gastritis    GERD (gastroesophageal reflux disease)     Hypercholesteremia    Hypertension    Prostate cancer ([place])    Prostatism    Reflux esophagitis    Renal stones    Skin cancer    left cheek/lesion excised   Sleep apnea    Tachycardia    a. 11/2017 admit w/ tachycardia/? afib-->no afib noted upon review (amio/OAC d/c'd).   TIA (transient ischemic attack)     Past Surgical History:  Procedure Laterality Date   CARDIAC CATHETERIZATION     COLONOSCOPY WITH PROPOFOL N/A 09/13/2015   Procedure: COLONOSCOPY WITH PROPOFOL;  Surgeon: Lollie Sails, MD;  Location: [facility] ENDOSCOPY;  Service: Endoscopy;  Laterality: N/A;   COLONOSCOPY WITH PROPOFOL N/A 05/07/2019   Procedure: COLONOSCOPY WITH PROPOFOL;  Surgeon: Robert Bellow, MD;  Location: [facility] ENDOSCOPY;  Service: Endoscopy;  Laterality: N/A;   CORONARY ANGIOPLASTY     ESOPHAGOGASTRODUODENOSCOPY (EGD) WITH PROPOFOL N/A 09/13/2015   Procedure: ESOPHAGOGASTRODUODENOSCOPY (EGD) WITH PROPOFOL;  Surgeon: Lollie Sails, MD;  Location: [facility] ENDOSCOPY;  Service: Endoscopy;  Laterality: N/A;   ESOPHAGOGASTRODUODENOSCOPY (EGD) WITH PROPOFOL N/A 12/28/2015   Procedure: ESOPHAGOGASTRODUODENOSCOPY (EGD) WITH PROPOFOL;  Surgeon: Lollie Sails, MD;  Location: [facility] ENDOSCOPY;  Service: Endoscopy;  Laterality: N/A;   ESOPHAGOGASTRODUODENOSCOPY (EGD) WITH PROPOFOL N/A 06/16/2016   Procedure: ESOPHAGOGASTRODUODENOSCOPY (EGD) WITH PROPOFOL;  Surgeon: Lollie Sails, MD;  Location: [facility] ENDOSCOPY;  Service: Endoscopy;  Laterality: N/A;   ESOPHAGOGASTRODUODENOSCOPY (EGD) WITH PROPOFOL N/A 05/07/2019   Procedure: ESOPHAGOGASTRODUODENOSCOPY (EGD) WITH PROPOFOL;  Surgeon: Robert Bellow, MD;  Location: [facility] ENDOSCOPY;  Service: Endoscopy;  Laterality: N/A;   EYE SURGERY  2013   CATARACT EXTRACTION   GANGLION CYST EXCISION Right 05/18/2017   Procedure: REMOVAL GANGLION CYST ANKLE;  Surgeon: Samara Deist, DPM;  Location: [facility] ORS;  Service: Podiatry;  Laterality: Right;   heart cath stent     LEFT  HEART CATH AND CORONARY ANGIOGRAPHY Right 12/03/2017   Procedure: Left Heart Cath and Coronary Angiography with possible coronary intervention;  Surgeon: Dionisio David, MD;  Location: [place] CV LAB;  Service: Cardiovascular;  Laterality: Right;   PACEMAKER IMPLANT N/A 04/04/2021   Procedure: PACEMAKER IMPLANT;  Surgeon: Deboraha Sprang, MD;  Location: [place] CV LAB;  Service: Cardiovascular;  Laterality: N/A;   PROSTATE BIOPSY     RIGHT/LEFT HEART CATH AND CORONARY ANGIOGRAPHY N/A 02/07/2021   Procedure: RIGHT/LEFT HEART CATH AND CORONARY ANGIOGRAPHY;  Surgeon: [name], MD;  Location: [place] CV LAB;  Service: Cardiovascular;  Laterality: N/A;   stents     multiple     Current Outpatient Medications  Medication Sig Dispense Refill   amLODipine (NORVASC) 2.5 MG tablet TAKE 1 TABLET BY MOUTH ONCE DAILY *REDUCED DOSE* 90 tablet 0   apixaban (ELIQUIS) 5 MG TABS tablet Take 1 tablet (5 mg total) by mouth 2 (two) times daily. 60 tablet 1   azelastine (ASTELIN) 0.1 % nasal spray      Calcium Carb-Cholecalciferol (  CALCIUM + D3 PO) Take 1 tablet by mouth daily.     cholecalciferol (VITAMIN D) 1000 units tablet Take 1,000 Units by mouth daily.      ferrous sulfate 324 MG TBEC Take 324 mg by mouth daily.     furosemide (LASIX) 20 MG tablet TAKE 1 TABLET BY MOUTH ONCE DAILY 90 tablet 2   isosorbide mononitrate (IMDUR) 30 MG 24 hr tablet TAKE 1 TABLET BY MOUTH ONCE DAILY 90 tablet 2   Leuprolide Acetate, 3 Month, (ELIGARD) 22.5 MG injection Inject 22.5 mg into the skin every 4 (four) months.     losartan (COZAAR) 50 MG tablet TAKE 1 TABLET BY MOUTH ONCE DAILY 90 tablet 1   lovastatin (MEVACOR) 40 MG tablet TAKE 2 TABLETS BY MOUTH AT BEDTIME 180 tablet 1   metoprolol succinate (TOPROL-XL) 25 MG 24 hr tablet Take 1 tablet (25 mg total) by mouth daily. 90 tablet 0   Multiple Vitamin (MULTI-VITAMINS) TABS Take 1 tablet by mouth daily.      pantoprazole (PROTONIX) 40 MG tablet  Take 40 mg by mouth 2 (two) times daily.      No current facility-administered medications for this visit.    Allergies:   Atorvastatin    Social History:  The patient  reports that he quit smoking about 36 years ago. His smoking use included cigarettes. He has a 35.00 pack-year smoking history. He has quit using smokeless tobacco.  His smokeless tobacco use included chew. He reports that he does not drink alcohol and does not use drugs.   Family History:  The patient's family history includes Cancer in his brother, brother, daughter, father, mother, sister, and another family member; Stroke in his father.    ROS:  Please see the history of present illness.   Otherwise, review of systems are positive for none.   All other systems are reviewed and negative.    PHYSICAL EXAM: VS:  BP 128/60 (BP Location: Left Arm, Patient Position: Sitting, Cuff Size: Normal)   Pulse 80   Ht '5\' 10"'$  (1.778 m)   Wt 221 lb 8 oz (100.5 kg)   SpO2 97%   BMI 31.78 kg/m  , BMI Body mass index is 31.78 kg/m. GEN: Well nourished, well developed, in no acute distress  HEENT: normal  Neck: no JVD, carotid bruits, or masses Cardiac: RRR; no murmurs, rubs, or gallops,no edema  Respiratory:  clear to auscultation bilaterally, normal work of breathing GI: soft, nontender, nondistended, + BS MS: no deformity or atrophy  Skin: warm and dry, no rash Neuro:  Strength and sensation are intact Psych: euthymic mood, full affect Radial pulses normal.  EKG:  EKG is ordered today. The ekg ordered today demonstrates atrial paced rhythm with no significant ST or T wave changes.   Recent Labs: 01/25/2022: TSH 2.15 05/09/2022: ALT 15; BUN 25; Creatinine, Ser 1.37; Hemoglobin 12.3; Platelets 183; Potassium 4.0; Sodium 140    Lipid Panel    Component Value Date/Time   CHOL 125 04/25/2022 0749   CHOL 103 09/16/2013 0438   TRIG 124.0 04/25/2022 0749   TRIG 95 09/16/2013 0438   HDL 32.90 (L) 04/25/2022 0749   HDL 30  (L) 09/16/2013 0438   CHOLHDL 4 04/25/2022 0749   VLDL 24.8 04/25/2022 0749   VLDL 19 09/16/2013 0438   LDLCALC 68 04/25/2022 0749   LDLCALC 54 09/16/2013 0438      Wt Readings from Last 3 Encounters:  05/12/22 221 lb 8 oz (100.5 kg)  05/09/22  223 lb (101.2 kg)  04/27/22 224 lb (101.6 kg)          04/15/2019    2:58 PM  PAD Screen  Previous PAD dx? No  Previous surgical procedure? No  Pain with walking? No  Feet/toe relief with dangling? No  Painful, non-healing ulcers? No  Extremities discolored? No      ASSESSMENT AND PLAN:  1.  Coronary artery disease involving native coronary arteries without angina: Cardiac catheterization in December of 2022 showed patent stents with no obstructive disease.  No antiplatelet medications given that he is on Eliquis.  2.  Chronic diastolic heart failure: He seems to be euvolemic on furosemide 20 mg once daily.  3.  Essential hypertension: Blood pressure is controlled on current medications.  4.  Hyperlipidemia:  History of intolerance to potent statins.  He tolerates lovastatin 40 mg daily.  I reviewed most recent lipid profile which showed an LDL of 64.  5.  Small abdominal aortic aneurysm: Less than 4 cm.  Followed by vascular surgery.  This has been stable.  In addition, he has mild nonobstructive carotid disease.  6.  Symptomatic bradycardia: Status post permanent pacemaker placement.  The pacemaker seems to be functioning normally.  7.  Paroxysmal atrial fibrillation: He was initially placed on anticoagulation due to DVT/PE but he also had A-fib with RVR while hospitalized at [facility] that lasted for 45 minutes.  In addition, his pacemaker device interrogation continues to show some atrial fibrillation although at a low burden.   I think it is best to continue anticoagulation for now.    Disposition: Follow-up in 6 months.   Signed,  Kathlyn Sacramento, MD  05/12/2022 9:11 AM    [place]

## 2022-05-12 NOTE — Patient Instructions (Signed)
Medication Instructions:  No changes *If you need a refill on your cardiac medications before your next appointment, please call your pharmacy*   Lab Work: None ordered If you have labs (blood work) drawn today and your tests are completely normal, you will receive your results only by: MyChart Message (if you have MyChart) OR A paper copy in the mail If you have any lab test that is abnormal or we need to change your treatment, we will call you to review the results.   Testing/Procedures: None ordered   Follow-Up: At [facility], you and your health needs are our priority.  As part of our continuing mission to provide you with exceptional heart care, we have created designated Provider Care Teams.  These Care Teams include your primary Cardiologist (physician) and Advanced Practice Providers (APPs -  Physician Assistants and Nurse Practitioners) who all work together to provide you with the care you need, when you need it.  We recommend signing up for the patient portal called "MyChart".  Sign up information is provided on this After Visit Summary.  MyChart is used to connect with patients for Virtual Visits (Telemedicine).  Patients are able to view lab/test results, encounter notes, upcoming appointments, etc.  Non-urgent messages can be sent to your provider as well.   To learn more about what you can do with MyChart, go to [email or web address].    Your next appointment:   6 month(s)  Provider:   You may see Muhammad Arida, MD or one of the following Advanced Practice Providers on your designated Care Team:   Christopher Berge, NP Ryan Dunn, PA-C Cadence Furth, PA-C Sheri Hammock, NP    

## 2022-05-16 ENCOUNTER — Encounter: Payer: Self-pay | Admitting: Internal Medicine

## 2022-05-16 ENCOUNTER — Ambulatory Visit: Payer: Medicare HMO | Attending: Internal Medicine | Admitting: Internal Medicine

## 2022-05-16 VITALS — BP 132/88 | HR 81 | Ht 70.0 in | Wt 222.6 lb

## 2022-05-16 DIAGNOSIS — Z950 Presence of cardiac pacemaker: Secondary | ICD-10-CM

## 2022-05-16 DIAGNOSIS — R42 Dizziness and giddiness: Secondary | ICD-10-CM

## 2022-05-16 DIAGNOSIS — I493 Ventricular premature depolarization: Secondary | ICD-10-CM | POA: Diagnosis not present

## 2022-05-16 DIAGNOSIS — Z79899 Other long term (current) drug therapy: Secondary | ICD-10-CM

## 2022-05-16 DIAGNOSIS — I472 Ventricular tachycardia, unspecified: Secondary | ICD-10-CM | POA: Diagnosis not present

## 2022-05-16 DIAGNOSIS — R001 Bradycardia, unspecified: Secondary | ICD-10-CM | POA: Diagnosis not present

## 2022-05-16 DIAGNOSIS — I498 Other specified cardiac arrhythmias: Secondary | ICD-10-CM

## 2022-05-16 LAB — PACEMAKER DEVICE OBSERVATION

## 2022-05-16 MED ORDER — FUROSEMIDE 40 MG PO TABS: 40.0000 mg | ORAL_TABLET | ORAL | 3 refills | Status: DC

## 2022-05-16 MED ORDER — POTASSIUM CHLORIDE CRYS ER 10 MEQ PO TBCR: EXTENDED_RELEASE_TABLET | ORAL | 3 refills | Status: DC

## 2022-05-16 NOTE — Patient Instructions (Addendum)
Medication Instructions:  Your physician has recommended you make the following change in your medication:   ** Stop Furosemide '20mg'$   ** Begin Furosemide '40mg'$  - 1 tablet by mouth daily x 5 days then reduce to 1 tablet by mouth every other day.  *If you need a refill on your cardiac medications before your next appointment, please call your pharmacy*  ** Begin Potassium as directed.  Lab Work:  BMET in 2 weeks  If you have labs (blood work) drawn today and your tests are completely normal, you will receive your results only by: [place] (if you have MyChart) OR A paper copy in the mail If you have any lab test that is abnormal or we need to change your treatment, we will call you to review the results.   Testing/Procedures: None ordered.    Follow-Up: At [facility], you and your health needs are our priority.  As part of our continuing mission to provide you with exceptional heart care, we have created designated Provider Care Teams.  These Care Teams include your primary Cardiologist (physician) and Advanced Practice Providers (APPs -  Physician Assistants and Nurse Practitioners) who all work together to provide you with the care you need, when you need it.  We recommend signing up for the patient portal called "MyChart".  Sign up information is provided on this After Visit Summary.  MyChart is used to connect with patients for Virtual Visits (Telemedicine).  Patients are able to view lab/test results, encounter notes, upcoming appointments, etc.  Non-urgent messages can be sent to your provider as well.   To learn more about what you can do with MyChart, go to [email or web address].    Your next appointment:   12 months with Dr Caryl Comes

## 2022-05-16 NOTE — Progress Notes (Signed)
Patient Care Team: Einar Pheasant, MD as PCP - General (Internal Medicine) [name], MD as PCP - Cardiology (Cardiology) [name], MD as Consulting Physician (Cardiology) Cammie Sickle, MD as Consulting Physician (Hematology and Oncology)   HPI  Nathaniel Thompson is a 87 y.o. male seen in follow-up for episodic dizziness associated with dyspnea with remote history of syncope.  Event recorder suggested a correlation with junctional rhythm. With  history of sinus bradycardia he underwent pacing with anticipation of antiarrhythmic suppression of his junctional rhythm. Significantly improved and at last visit >> added rate response  He is much improved.  Rare dizziness and breathing much better since pacing   DOE improved with activeation of rate response. When he goes to the gym, exertion is limited.  Had a fall out of a tree stand when the ladder broke, fractured his left ankle and has had left greater than right edema. No chest pain.  No more lightheadedness.   He has a history of coronary artery disease with prior PCI 2003 and 2004 when stents were patent 2022 History of TIA.   DATE TEST EF    9/19 LHC   No obstructive disease  3/21 Echo   55-60 %    12/22 LHC   LCXst and RCAst patent No obstructive CAD    Date Cr K Hgb LDL  1/23 1.41 4.0 12.5 62 (3/23)   3/24 1.37 4.0 12.3     Records and Results Reviewed  Past Medical History:  Diagnosis Date   (HFpEF) heart failure with preserved ejection fraction ([place])    a. 05/2018 Echo: Nl EF, Gr1 DD, sev dil LA, mild MR/TR, mild PAH; b. 05/2019 Echo: EF 55-60%, no rwma, mild LVH. Nl PASP. Mildly dil LA. Triv MR. Mild AoV sclerosis w/o stenosis. Mildly dil Asc AO (35m).   Allergic rhinitis    Barrett's esophagus 10/21/2013   Bone cancer (HCC)    CKD (chronic kidney disease), stage III (HCC)    Colon polyp, hyperplastic    COPD (chronic obstructive pulmonary disease) (HCC)    mild COPD. former smoker    Coronary artery disease    a. 2003 s/p PCI ([facility]); b. 2004 s/p PCI x 2 ([facility]); c. 11/2017 Cath: LM nl, LAD mild dzs, LCX patent stent w/ 30% distal edge restenosis, RCA patent distal stent w/ 30% prox edge stenosis. Nl EF->Med Rx;   Diverticulosis    Fundic gland polyps of stomach, benign    Gastritis    GERD (gastroesophageal reflux disease)    Hypercholesteremia    Hypertension    Prostate cancer ([place]    Prostatism    Reflux esophagitis    Renal stones    Skin cancer    left cheek/lesion excised   Sleep apnea    Tachycardia    a. 11/2017 admit w/ tachycardia/? afib-->no afib noted upon review (amio/OAC d/c'd).   TIA (transient ischemic attack)     Past Surgical History:  Procedure Laterality Date   CARDIAC CATHETERIZATION     COLONOSCOPY WITH PROPOFOL N/A 09/13/2015   Procedure: COLONOSCOPY WITH PROPOFOL;  Surgeon: MLollie Sails MD;  [facility];  Service: Endoscopy;  Laterality: N/A;   COLONOSCOPY WITH PROPOFOL N/A 05/07/2019   Procedure: COLONOSCOPY WITH PROPOFOL;  Surgeon: BRobert Bellow MD;  Location: [facility] ENDOSCOPY;  Service: Endoscopy;  Laterality: N/A;   CORONARY ANGIOPLASTY     ESOPHAGOGASTRODUODENOSCOPY (EGD) WITH PROPOFOL N/A 09/13/2015   Procedure: ESOPHAGOGASTRODUODENOSCOPY (EGD)  WITH PROPOFOL;  Surgeon: Lollie Sails, MD;  Location: [facility] ENDOSCOPY;  Service: Endoscopy;  Laterality: N/A;   ESOPHAGOGASTRODUODENOSCOPY (EGD) WITH PROPOFOL N/A 12/28/2015   Procedure: ESOPHAGOGASTRODUODENOSCOPY (EGD) WITH PROPOFOL;  Surgeon: Lollie Sails, MD;  Location: [facility] ENDOSCOPY;  Service: Endoscopy;  Laterality: N/A;   ESOPHAGOGASTRODUODENOSCOPY (EGD) WITH PROPOFOL N/A 06/16/2016   Procedure: ESOPHAGOGASTRODUODENOSCOPY (EGD) WITH PROPOFOL;  Surgeon: Lollie Sails, MD;  Location: [facility] ENDOSCOPY;  Service: Endoscopy;  Laterality: N/A;   ESOPHAGOGASTRODUODENOSCOPY (EGD) WITH PROPOFOL N/A 05/07/2019   Procedure: ESOPHAGOGASTRODUODENOSCOPY (EGD) WITH PROPOFOL;   Surgeon: Robert Bellow, MD;  Location: [facility] ENDOSCOPY;  Service: Endoscopy;  Laterality: N/A;   EYE SURGERY  2013   CATARACT EXTRACTION   GANGLION CYST EXCISION Right 05/18/2017   Procedure: REMOVAL GANGLION CYST ANKLE;  Surgeon: Samara Deist, DPM;  Location: [facility] ORS;  Service: Podiatry;  Laterality: Right;   heart cath stent     LEFT HEART CATH AND CORONARY ANGIOGRAPHY Right 12/03/2017   Procedure: Left Heart Cath and Coronary Angiography with possible coronary intervention;  Surgeon: Dionisio David, MD;  Location: [place] CV LAB;  Service: Cardiovascular;  Laterality: Right;   PACEMAKER IMPLANT N/A 04/04/2021   Procedure: PACEMAKER IMPLANT;  Surgeon: Deboraha Sprang, MD;  Location: [place] CV LAB;  Service: Cardiovascular;  Laterality: N/A;   PROSTATE BIOPSY     RIGHT/LEFT HEART CATH AND CORONARY ANGIOGRAPHY N/A 02/07/2021   Procedure: RIGHT/LEFT HEART CATH AND CORONARY ANGIOGRAPHY;  Surgeon: [name], MD;  Location: [place] CV LAB;  Service: Cardiovascular;  Laterality: N/A;   stents     multiple    Current Meds  Medication Sig   amLODipine (NORVASC) 2.5 MG tablet TAKE 1 TABLET BY MOUTH ONCE DAILY *REDUCED DOSE*   apixaban (ELIQUIS) 5 MG TABS tablet Take 1 tablet (5 mg total) by mouth 2 (two) times daily.   azelastine (ASTELIN) 0.1 % nasal spray    Calcium Carb-Cholecalciferol (CALCIUM + D3 PO) Take 1 tablet by mouth daily.   cholecalciferol (VITAMIN D) 1000 units tablet Take 1,000 Units by mouth daily.    ferrous sulfate 324 MG TBEC Take 324 mg by mouth daily.   furosemide (LASIX) 20 MG tablet TAKE 1 TABLET BY MOUTH ONCE DAILY   isosorbide mononitrate (IMDUR) 30 MG 24 hr tablet TAKE 1 TABLET BY MOUTH ONCE DAILY   Leuprolide Acetate, 3 Month, (ELIGARD) 22.5 MG injection Inject 22.5 mg into the skin every 4 (four) months.   losartan (COZAAR) 50 MG tablet TAKE 1 TABLET BY MOUTH ONCE DAILY   lovastatin (MEVACOR) 40 MG tablet TAKE 2 TABLETS BY MOUTH AT  BEDTIME   metoprolol succinate (TOPROL-XL) 25 MG 24 hr tablet Take 1 tablet (25 mg total) by mouth daily.   Multiple Vitamin (MULTI-VITAMINS) TABS Take 1 tablet by mouth daily.    pantoprazole (PROTONIX) 40 MG tablet Take 40 mg by mouth 2 (two) times daily.     Allergies  Allergen Reactions   Atorvastatin Other (See Comments)    Achy joints      Review of Systems negative except from HPI and PMH  Physical Exam BP 132/88 (BP Location: Left Arm, Patient Position: Sitting, Cuff Size: Normal)   Pulse 81   Ht '5\' 10"'$  (1.778 m)   Wt 222 lb 9.6 oz (101 kg)   SpO2 99%   BMI 31.94 kg/m  Well developed and well nourished in no acute distress HENT normal Neck supple  Clear Device pocket well healed; without  hematoma or erythema.  There is no tethering  Regular rate and rhythm, no  gallop No  murmur Abd-soft with active BS No Clubbing cyanosis 3-left 2+-right  edema Skin-warm and dry A & Oriented  Grossly normal sensory and motor function  ECG atrial pacing at 81 Interval 20/09/39  Device function is normal. Programming changes none See Paceart for details     Estimated Creatinine Clearance: 47.8 mL/min (A) (by C-G formula based on SCr of 1.37 mg/dL (H)).  The patient's device was interrogated and the information was fully reviewed.  The device was reprogrammed to activate rate response and reprogram outputs to chronic settings Assessment and  Plan Pacemaker-[employer]   Dizziness often but not always associated with dyspnea-sometimes positional sometimes not   Ischemic heart disease with prior PCI and in no obstructive lesions 12/22; normal LV function  Sinus bradycardia/chronotropic incompetence   Junctional rhythm--accelerated symptomatic    PVCs   Ventricular tachycardia-nonsustained   Ectopic low atrial rhythm   Hypertension    Dyspnea is improved but limiting; dizziness is resolved.  He is volume overloaded.  He takes Lasix 20 mg daily without significant  response.  Will increase it to 40 mg daily x 5 days and then 40 mg every other day.  Will begin him on KCl 10 mEq to take on the days that he takes his diuretic;  we will check a metabolic profile in 2-3 weeks time.  Blood pressure is well-controlled; continue him on amlodipine 5 losartan 50 and metoprolol 25.  LDL is less than 70,  continue Mevacor 80  VTNS continue metop

## 2022-05-16 NOTE — Progress Notes (Signed)
Remote pacemaker transmission.   

## 2022-05-22 ENCOUNTER — Encounter: Payer: Self-pay | Admitting: Internal Medicine

## 2022-05-29 ENCOUNTER — Other Ambulatory Visit: Payer: Self-pay | Admitting: Cardiovascular Disease

## 2022-05-29 ENCOUNTER — Other Ambulatory Visit
Admission: RE | Admit: 2022-05-29 | Discharge: 2022-05-29 | Disposition: A | Discharge status: Home / Self Care | Payer: Medicare HMO | Attending: Internal Medicine | Admitting: Internal Medicine

## 2022-05-29 DIAGNOSIS — I493 Ventricular premature depolarization: Secondary | ICD-10-CM | POA: Insufficient documentation

## 2022-05-29 DIAGNOSIS — I498 Other specified cardiac arrhythmias: Secondary | ICD-10-CM | POA: Diagnosis present

## 2022-05-29 DIAGNOSIS — R001 Bradycardia, unspecified: Secondary | ICD-10-CM | POA: Insufficient documentation

## 2022-05-29 DIAGNOSIS — R42 Dizziness and giddiness: Secondary | ICD-10-CM | POA: Insufficient documentation

## 2022-05-29 DIAGNOSIS — Z79899 Other long term (current) drug therapy: Secondary | ICD-10-CM | POA: Insufficient documentation

## 2022-05-29 DIAGNOSIS — Z950 Presence of cardiac pacemaker: Secondary | ICD-10-CM | POA: Diagnosis present

## 2022-05-29 DIAGNOSIS — I472 Ventricular tachycardia, unspecified: Secondary | ICD-10-CM | POA: Diagnosis present

## 2022-05-29 LAB — BASIC METABOLIC PANEL
Anion gap: 8 (ref 5–15)
BUN: 27 mg/dL — ABNORMAL HIGH (ref 8–23)
CO2: 25 mmol/L (ref 22–32)
Calcium: 9.4 mg/dL (ref 8.9–10.3)
Chloride: 107 mmol/L (ref 98–111)
Creatinine, Ser: 1.42 mg/dL — ABNORMAL HIGH (ref 0.61–1.24)
GFR, Estimated: 49 mL/min — ABNORMAL LOW (ref >60)
Glucose, Bld: 103 mg/dL — ABNORMAL HIGH (ref 70–99)
Potassium: 4.2 mmol/L (ref 3.5–5.1)
Sodium: 140 mmol/L (ref 135–145)

## 2022-07-10 ENCOUNTER — Other Ambulatory Visit: Payer: Self-pay | Admitting: Cardiovascular Disease

## 2022-07-11 ENCOUNTER — Ambulatory Visit (INDEPENDENT_AMBULATORY_CARE_PROVIDER_SITE_OTHER): Payer: Medicare HMO

## 2022-07-11 DIAGNOSIS — R001 Bradycardia, unspecified: Secondary | ICD-10-CM | POA: Diagnosis not present

## 2022-07-11 LAB — CUP PACEART REMOTE DEVICE CHECK
Battery Remaining Longevity: 148 mo
Battery Voltage: 3.04 V
Brady Statistic AP VP Percent: 0.09 %
Brady Statistic AP VS Percent: 92.74 %
Brady Statistic AS VP Percent: 0.03 %
Brady Statistic AS VS Percent: 7.13 %
Brady Statistic RA Percent Paced: 93.35 %
Brady Statistic RV Percent Paced: 0.13 %
Date Time Interrogation Session: 20240507042927
Implantable Lead Connection Status: 753985
Implantable Lead Connection Status: 753985
Implantable Lead Implant Date: 20230130
Implantable Lead Implant Date: 20230130
Implantable Lead Location: 753859
Implantable Lead Location: 753860
Implantable Lead Model: 3830
Implantable Lead Model: 5076
Implantable Pulse Generator Implant Date: 20230130
Lead Channel Impedance Value: 323 Ohm
Lead Channel Impedance Value: 418 Ohm
Lead Channel Impedance Value: 494 Ohm
Lead Channel Impedance Value: 589 Ohm
Lead Channel Pacing Threshold Amplitude: 0.75 V
Lead Channel Pacing Threshold Amplitude: 0.875 V
Lead Channel Pacing Threshold Pulse Width: 0.4 ms
Lead Channel Pacing Threshold Pulse Width: 0.4 ms
Lead Channel Sensing Intrinsic Amplitude: 1.625 mV
Lead Channel Sensing Intrinsic Amplitude: 1.625 mV
Lead Channel Sensing Intrinsic Amplitude: 2.25 mV
Lead Channel Sensing Intrinsic Amplitude: 2.25 mV
Lead Channel Setting Pacing Amplitude: 1.75 V
Lead Channel Setting Pacing Amplitude: 2 V
Lead Channel Setting Pacing Pulse Width: 0.4 ms
Lead Channel Setting Sensing Sensitivity: 0.9 mV
Zone Setting Status: 755011

## 2022-07-20 ENCOUNTER — Other Ambulatory Visit: Payer: Self-pay | Admitting: Internal Medicine

## 2022-07-28 ENCOUNTER — Other Ambulatory Visit (INDEPENDENT_AMBULATORY_CARE_PROVIDER_SITE_OTHER): Payer: Medicare HMO

## 2022-07-28 ENCOUNTER — Other Ambulatory Visit: Payer: Self-pay | Admitting: Cardiovascular Disease

## 2022-07-28 DIAGNOSIS — R739 Hyperglycemia, unspecified: Secondary | ICD-10-CM | POA: Diagnosis not present

## 2022-07-28 DIAGNOSIS — C61 Malignant neoplasm of prostate: Secondary | ICD-10-CM

## 2022-07-28 DIAGNOSIS — E7800 Pure hypercholesterolemia, unspecified: Secondary | ICD-10-CM | POA: Diagnosis not present

## 2022-07-28 DIAGNOSIS — I10 Essential (primary) hypertension: Secondary | ICD-10-CM

## 2022-07-28 LAB — HEPATIC FUNCTION PANEL
ALT: 11 U/L (ref 0–53)
AST: 16 U/L (ref 0–37)
Albumin: 3.8 g/dL (ref 3.5–5.2)
Alkaline Phosphatase: 47 U/L (ref 39–117)
Bilirubin, Direct: 0.1 mg/dL (ref 0.0–0.3)
Total Bilirubin: 0.3 mg/dL (ref 0.2–1.2)
Total Protein: 6.2 g/dL (ref 6.0–8.3)

## 2022-07-28 LAB — LIPID PANEL
Cholesterol: 112 mg/dL (ref 0–200)
HDL: 28.5 mg/dL — ABNORMAL LOW (ref >39.00)
LDL Cholesterol: 45 mg/dL (ref 0–99)
NonHDL: 83.98
Total CHOL/HDL Ratio: 4
Triglycerides: 196 mg/dL — ABNORMAL HIGH (ref 0.0–149.0)
VLDL: 39.2 mg/dL (ref 0.0–40.0)

## 2022-07-28 LAB — BASIC METABOLIC PANEL
BUN: 25 mg/dL — ABNORMAL HIGH (ref 6–23)
CO2: 24 mEq/L (ref 19–32)
Calcium: 9.4 mg/dL (ref 8.4–10.5)
Chloride: 107 mEq/L (ref 96–112)
Creatinine, Ser: 1.35 mg/dL (ref 0.40–1.50)
GFR: 48.17 mL/min — ABNORMAL LOW (ref >60.00)
Glucose, Bld: 101 mg/dL — ABNORMAL HIGH (ref 70–99)
Potassium: 4.3 mEq/L (ref 3.5–5.1)
Sodium: 141 mEq/L (ref 135–145)

## 2022-07-28 LAB — HEMOGLOBIN A1C: Hgb A1c MFr Bld: 5.8 % (ref 4.6–6.5)

## 2022-07-28 NOTE — Addendum Note (Signed)
Addended by: Jarvis Morgan D on: 07/28/2022 07:53 AM   Modules accepted: Orders

## 2022-08-01 ENCOUNTER — Encounter: Payer: Self-pay | Admitting: Internal Medicine

## 2022-08-01 ENCOUNTER — Ambulatory Visit: Payer: Medicare HMO | Admitting: Internal Medicine

## 2022-08-01 VITALS — BP 108/68 | HR 79 | Temp 97.9°F | Resp 16 | Ht 70.0 in | Wt 220.0 lb

## 2022-08-01 DIAGNOSIS — I714 Abdominal aortic aneurysm, without rupture, unspecified: Secondary | ICD-10-CM

## 2022-08-01 DIAGNOSIS — I4891 Unspecified atrial fibrillation: Secondary | ICD-10-CM

## 2022-08-01 DIAGNOSIS — G4730 Sleep apnea, unspecified: Secondary | ICD-10-CM

## 2022-08-01 DIAGNOSIS — R739 Hyperglycemia, unspecified: Secondary | ICD-10-CM

## 2022-08-01 DIAGNOSIS — I25119 Atherosclerotic heart disease of native coronary artery with unspecified angina pectoris: Secondary | ICD-10-CM

## 2022-08-01 DIAGNOSIS — J449 Chronic obstructive pulmonary disease, unspecified: Secondary | ICD-10-CM

## 2022-08-01 DIAGNOSIS — Z0000 Encounter for general adult medical examination without abnormal findings: Secondary | ICD-10-CM

## 2022-08-01 DIAGNOSIS — E7800 Pure hypercholesterolemia, unspecified: Secondary | ICD-10-CM

## 2022-08-01 DIAGNOSIS — D649 Anemia, unspecified: Secondary | ICD-10-CM

## 2022-08-01 DIAGNOSIS — I724 Aneurysm of artery of lower extremity: Secondary | ICD-10-CM

## 2022-08-01 DIAGNOSIS — K219 Gastro-esophageal reflux disease without esophagitis: Secondary | ICD-10-CM

## 2022-08-01 DIAGNOSIS — I5032 Chronic diastolic (congestive) heart failure: Secondary | ICD-10-CM

## 2022-08-01 DIAGNOSIS — I6523 Occlusion and stenosis of bilateral carotid arteries: Secondary | ICD-10-CM

## 2022-08-01 DIAGNOSIS — I10 Essential (primary) hypertension: Secondary | ICD-10-CM

## 2022-08-01 DIAGNOSIS — N1831 Chronic kidney disease, stage 3a: Secondary | ICD-10-CM

## 2022-08-01 MED ORDER — LOVASTATIN 40 MG PO TABS: 80.0000 mg | ORAL_TABLET | Freq: Every day | ORAL | 1 refills | Status: DC

## 2022-08-01 MED ORDER — ISOSORBIDE MONONITRATE ER 30 MG PO TB24: 30.0000 mg | ORAL_TABLET | Freq: Every day | ORAL | 2 refills | Status: DC

## 2022-08-01 NOTE — Assessment & Plan Note (Signed)
Saw Nathaniel Thompson ([place]) 01/2022 - recommended f/u aortic duplex in 12 months.  

## 2022-08-01 NOTE — Assessment & Plan Note (Signed)
Physical today 08/01/22.  PSA - followed by Dr Donneta Romberg.

## 2022-08-01 NOTE — Progress Notes (Signed)
Subjective:    Patient ID: Nathaniel Thompson, male    DOB: 1935/09/27, 87 y.o.   MRN: [id number]  Patient here for  Chief Complaint  Patient presents with   Medical Management of Chronic Issues    HPI Here for a physical exam. He is accompanied by his wife.  History obtained from both of them. Was scheduled for physical, but too early for physical.  Saw Dr Graciela Husbands (05/2022) - f/u nonsustained vtach.  Lasix dose adjusted.  Instructed to continue on 40mg  qod.  Had f/u with Dr Kirke Corin 05/12/22.  No changes made.  His pacemaker device interrogation continues to show some atrial fibrillation although at a low burden. Recommended to continue anticoagulation. Had f/u with Dr Donneta Romberg 05/09/22 - f/u prostate cancer.  Getting intermittent eligard.  Also followed for IDA.  On oral iron now.  Overall doing better.  No chest pain.  Some fatigue, but overall better.  Discussed monitoring weight.  Discussed avoiding increased salt intake.     Past Medical History:  Diagnosis Date   (HFpEF) heart failure with preserved ejection fraction (HCC)    a. 05/2018 Echo: Nl EF, Gr1 DD, sev dil LA, mild MR/TR, mild PAH; b. 05/2019 Echo: EF 55-60%, no rwma, mild LVH. Nl PASP. Mildly dil LA. Triv MR. Mild AoV sclerosis w/o stenosis. Mildly dil Asc AO (39mm).   Allergic rhinitis    Barrett's esophagus 10/21/2013   Bone cancer (HCC)    CKD (chronic kidney disease), stage III (HCC)    Colon polyp, hyperplastic    COPD (chronic obstructive pulmonary disease) (HCC)    mild COPD. former smoker   Coronary artery disease    a. 2003 s/p PCI ([facility]); b. 2004 s/p PCI x 2 ([facility]); c. 11/2017 Cath: LM nl, LAD mild dzs, LCX patent stent w/ 30% distal edge restenosis, RCA patent distal stent w/ 30% prox edge stenosis. Nl EF->Med Rx;   Diverticulosis    Fundic gland polyps of stomach, benign    Gastritis    GERD (gastroesophageal reflux disease)    Hypercholesteremia    Hypertension    Prostate cancer (HCC)    Prostatism    Reflux  esophagitis    Renal stones    Skin cancer    left cheek/lesion excised   Sleep apnea    Tachycardia    a. 11/2017 admit w/ tachycardia/? afib-->no afib noted upon review (amio/OAC d/c'd).   TIA (transient ischemic attack)    Past Surgical History:  Procedure Laterality Date   CARDIAC CATHETERIZATION     COLONOSCOPY WITH PROPOFOL N/A 09/13/2015   Procedure: COLONOSCOPY WITH PROPOFOL;  Surgeon: Christena Deem, MD;  Location: [facility] ENDOSCOPY;  Service: Endoscopy;  Laterality: N/A;   COLONOSCOPY WITH PROPOFOL N/A 05/07/2019   Procedure: COLONOSCOPY WITH PROPOFOL;  Surgeon: Earline Mayotte, MD;  Location: [facility] ENDOSCOPY;  Service: Endoscopy;  Laterality: N/A;   CORONARY ANGIOPLASTY     ESOPHAGOGASTRODUODENOSCOPY (EGD) WITH PROPOFOL N/A 09/13/2015   Procedure: ESOPHAGOGASTRODUODENOSCOPY (EGD) WITH PROPOFOL;  Surgeon: Christena Deem, MD;  Location: [facility] ENDOSCOPY;  Service: Endoscopy;  Laterality: N/A;   ESOPHAGOGASTRODUODENOSCOPY (EGD) WITH PROPOFOL N/A 12/28/2015   Procedure: ESOPHAGOGASTRODUODENOSCOPY (EGD) WITH PROPOFOL;  Surgeon: Christena Deem, MD;  Location: [facility] ENDOSCOPY;  Service: Endoscopy;  Laterality: N/A;   ESOPHAGOGASTRODUODENOSCOPY (EGD) WITH PROPOFOL N/A 06/16/2016   Procedure: ESOPHAGOGASTRODUODENOSCOPY (EGD) WITH PROPOFOL;  Surgeon: Christena Deem, MD;  Location: [facility] ENDOSCOPY;  Service: Endoscopy;  Laterality: N/A;   ESOPHAGOGASTRODUODENOSCOPY (EGD) WITH PROPOFOL N/A  05/07/2019   Procedure: ESOPHAGOGASTRODUODENOSCOPY (EGD) WITH PROPOFOL;  Surgeon: Earline Mayotte, MD;  Location: [facility] ENDOSCOPY;  Service: Endoscopy;  Laterality: N/A;   EYE SURGERY  2013   CATARACT EXTRACTION   GANGLION CYST EXCISION Right 05/18/2017   Procedure: REMOVAL GANGLION CYST ANKLE;  Surgeon: Gwyneth Revels, DPM;  Location: [facility] ORS;  Service: Podiatry;  Laterality: Right;   heart cath stent     LEFT HEART CATH AND CORONARY ANGIOGRAPHY Right 12/03/2017   Procedure: Left Heart Cath and  Coronary Angiography with possible coronary intervention;  Surgeon: Laurier Nancy, MD;  Location: [facility] INVASIVE CV LAB;  Service: Cardiovascular;  Laterality: Right;   PACEMAKER IMPLANT N/A 04/04/2021   Procedure: PACEMAKER IMPLANT;  Surgeon: [facility] Salvia, MD;  Location: [facility] INVASIVE CV LAB;  Service: Cardiovascular;  Laterality: N/A;   PROSTATE BIOPSY     RIGHT/LEFT HEART CATH AND CORONARY ANGIOGRAPHY N/A 02/07/2021   Procedure: RIGHT/LEFT HEART CATH AND CORONARY ANGIOGRAPHY;  Surgeon: Iran Ouch, MD;  Location: [facility] INVASIVE CV LAB;  Service: Cardiovascular;  Laterality: N/A;   stents     multiple   Family History  Problem Relation Age of Onset   Cancer Mother        gastric and lung   Cancer Father        multiple myeloma   Stroke Father    Cancer Sister        leukemia   Cancer Brother        leukemia   Cancer Brother        kidney   Cancer Daughter        Uterine   Cancer Other        [place]hes (Sister's Son): Prostate   Social History   Socioeconomic History   Marital status: Married    Spouse name: Not on file   Number of children: Not on file   Years of education: Not on file   Highest education level: Not on file  Occupational History   Not on file  Tobacco Use   Smoking status: Former    Packs/day: 1.00    Years: 35.00    Additional pack years: 0.00    Total pack years: 35.00    Types: Cigarettes    Quit date: 03/06/1986    Years since quitting: 36.4   Smokeless tobacco: Former    Types: Associate Professor Use: Never used  Substance and Sexual Activity   Alcohol use: No   Drug use: No   Sexual activity: Not Currently  Other Topics Concern   Not on file  Social History Narrative   Not on file   Social Determinants of Health   Financial Resource Strain: Low Risk  (10/04/2021)   Overall Financial Resource Strain (CARDIA)    Difficulty of Paying Living Expenses: Not hard at all  Food Insecurity: No Food Insecurity (10/04/2021)   Hunger  Vital Sign    Worried About Running Out of Food in the Last Year: Never true    Ran Out of Food in the Last Year: Never true  Transportation Needs: No Transportation Needs (10/04/2021)   PRAPARE - Administrator, Civil Service (Medical): No    Lack of Transportation (Non-Medical): No  Physical Activity: Insufficiently Active (10/04/2021)   Exercise Vital Sign    Days of Exercise per Week: 4 days    Minutes of Exercise per Session: 30 min  Stress: No Stress Concern Present (10/04/2021)  [employer] of Occupational Health - Occupational Stress Questionnaire    Feeling of Stress : Not at all  Social Connections: Unknown (10/04/2021)   Social Connection and Isolation Panel [NHANES]    Frequency of Communication with Friends and Family: Not on file    Frequency of Social Gatherings with Friends and Family: Not on file    Attends Religious Services: Not on file    Active Member of Clubs or Organizations: Not on file    Attends Banker Meetings: Not on file    Marital Status: Married     Review of Systems  Constitutional:  Negative for appetite change and unexpected weight change.  HENT:  Negative for congestion, sinus pressure and sore throat.   Eyes:  Negative for pain and visual disturbance.  Respiratory:  Negative for cough and chest tightness.        Breathing overall stable.   Cardiovascular:  Negative for chest pain and palpitations.  Gastrointestinal:  Negative for abdominal pain, diarrhea, nausea and vomiting.  Genitourinary:  Negative for difficulty urinating and dysuria.  Musculoskeletal:  Negative for joint swelling and myalgias.  Skin:  Negative for color change and rash.  Neurological:  Negative for dizziness and headaches.  Hematological:  Negative for adenopathy. Does not bruise/bleed easily.  Psychiatric/Behavioral:  Negative for agitation and dysphoric mood.        Objective:     BP 108/68   Pulse 79   Temp 97.9 F (36.6 C)   Resp 16    Ht 5\' 10"  (1.778 m)   Wt 220 lb (99.8 kg)   SpO2 98%   BMI 31.57 kg/m  Wt Readings from Last 3 Encounters:  08/01/22 220 lb (99.8 kg)  05/16/22 222 lb 9.6 oz (101 kg)  05/12/22 221 lb 8 oz (100.5 kg)    Physical Exam Constitutional:      General: He is not in acute distress.    Appearance: Normal appearance. He is well-developed.  HENT:     Head: Normocephalic and atraumatic.     Right Ear: External ear normal.     Left Ear: External ear normal.  Eyes:     General: No scleral icterus.       Right eye: No discharge.        Left eye: No discharge.     Conjunctiva/sclera: Conjunctivae normal.  Neck:     Thyroid: No thyromegaly.  Cardiovascular:     Rate and Rhythm: Normal rate and regular rhythm.  Pulmonary:     Effort: No respiratory distress.     Breath sounds: Normal breath sounds. No wheezing.  Abdominal:     General: Bowel sounds are normal.     Palpations: Abdomen is soft.     Tenderness: There is no abdominal tenderness.  Musculoskeletal:        General: No swelling or tenderness.     Cervical back: Neck supple. No tenderness.  Lymphadenopathy:     Cervical: No cervical adenopathy.  Skin:    Findings: No erythema or rash.  Neurological:     Mental Status: He is alert and oriented to person, place, and time.  Psychiatric:        Mood and Affect: Mood normal.        Behavior: Behavior normal.      Outpatient Encounter Medications as of 08/01/2022  Medication Sig   amLODipine (NORVASC) 2.5 MG tablet TAKE 1 TABLET BY MOUTH ONCE DAILY *REDUCED DOSE*   apixaban (ELIQUIS) 5 MG  TABS tablet Take 1 tablet (5 mg total) by mouth 2 (two) times daily.   Calcium Carb-Cholecalciferol (CALCIUM + D3 PO) Take 1 tablet by mouth daily.   cholecalciferol (VITAMIN D) 1000 units tablet Take 1,000 Units by mouth daily.    ferrous sulfate 324 MG TBEC Take 324 mg by mouth daily.   furosemide (LASIX) 40 MG tablet Take 1 tablet (40 mg total) by mouth every other day.   isosorbide  mononitrate (IMDUR) 30 MG 24 hr tablet Take 1 tablet (30 mg total) by mouth daily.   Leuprolide Acetate, 3 Month, (ELIGARD) 22.5 MG injection Inject 22.5 mg into the skin every 4 (four) months.   losartan (COZAAR) 50 MG tablet TAKE 1 TABLET BY MOUTH ONCE DAILY   lovastatin (MEVACOR) 40 MG tablet Take 2 tablets (80 mg total) by mouth at bedtime.   metoprolol succinate (TOPROL-XL) 25 MG 24 hr tablet TAKE 1 TABLET BY MOUTH ONCE DAILY   Multiple Vitamin (MULTI-VITAMINS) TABS Take 1 tablet by mouth daily.    pantoprazole (PROTONIX) 40 MG tablet Take 40 mg by mouth 2 (two) times daily.    potassium chloride (KLOR-CON M) 10 MEQ tablet Take 1 tablet by mouth every other day with Furosemide.   [DISCONTINUED] azelastine (ASTELIN) 0.1 % nasal spray    [DISCONTINUED] isosorbide mononitrate (IMDUR) 30 MG 24 hr tablet TAKE 1 TABLET BY MOUTH ONCE DAILY   [DISCONTINUED] lovastatin (MEVACOR) 40 MG tablet TAKE 2 TABLETS BY MOUTH AT BEDTIME   No facility-administered encounter medications on file as of 08/01/2022.     Lab Results  Component Value Date   WBC 4.4 05/09/2022   HGB 12.3 (L) 05/09/2022   HCT 36.1 (L) 05/09/2022   PLT 183 05/09/2022   GLUCOSE 101 (H) 07/28/2022   CHOL 112 07/28/2022   TRIG 196.0 (H) 07/28/2022   HDL 28.50 (L) 07/28/2022   LDLCALC 45 07/28/2022   ALT 11 07/28/2022   AST 16 07/28/2022   NA 141 07/28/2022   K 4.3 07/28/2022   CL 107 07/28/2022   CREATININE 1.35 07/28/2022   BUN 25 (H) 07/28/2022   CO2 24 07/28/2022   TSH 2.15 01/25/2022   PSA 5.47 (H) 08/03/2016   HGBA1C 5.8 07/28/2022    No results found.     Assessment & Plan:  Routine general medical examination at a health care facility  Healthcare maintenance Assessment & Plan: Physical today 08/01/22.  PSA - followed by Dr Donneta Romberg.    Abdominal aortic aneurysm (AAA) without rupture, unspecified part [facility]) Assessment & Plan: Saw [facility] [place]) 01/2022 - recommended f/u aortic duplex in 12 months.     Bilateral carotid artery stenosis Assessment & Plan: Carotid ultrasound 01/2022 - Duplex ultrasound shows <40% stenosis bilaterally. Recommended f/u one year.    Anemia, unspecified type Assessment & Plan: Followed by hematology.  Has received IV iron.  Follow.    Atrial fibrillation, unspecified type [facility]) Assessment & Plan: Appears to be in SR today.  Follow.    Chronic heart failure with preserved ejection fraction [facility]) Assessment & Plan: Followed by cardiology.  ECHO - 05/2019 - EF 55-60%.  Continues on losartan, imdur and amlodipine.  Blood pressure doing well.  Overall has been doing better.  Lasix dose recently adjusted by cardiology.  Discussed monitoring diet - avoid increased sodium.  Weight daily.  Follow.  Call with problems.    Stage 3a chronic kidney disease (HCC) Assessment & Plan: Continue to avoid antiinflammatories. Follow metabolic panel.    Chronic  obstructive pulmonary disease, unspecified COPD type (HCC) Assessment & Plan: Breathing stable.    Coronary artery disease involving native coronary artery of native heart with angina pectoris [facility]) Assessment & Plan: Dr Kirke Corin - 01/12/22 -  Cardiac catheterization in December showed patent stents with no obstructive disease. Continue plavix.  Aspirin stopped.    GERD without esophagitis Assessment & Plan: Continue protonix.    Hypercholesterolemia Assessment & Plan: Continue lovastatin.  Follow lipid panel and liver function tests.     Hyperglycemia Assessment & Plan: Follow met b and a1c.    Hypertension, essential Assessment & Plan: Blood pressure as outlined.  On imdur, amlodipine and losartan.  Continue to follow pressure.  Continue to monitor metabolic panel.     Sleep apnea, unspecified type Assessment & Plan: Continue cpap.    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  Evaluated 01/2020.  Stable.  Recommended f/u scanning in 2 years. F/u with vascular.      Other orders -     Isosorbide Mononitrate ER; Take 1 tablet (30 mg total) by mouth daily.  Dispense: 90 tablet; Refill: 2 -     Lovastatin; Take 2 tablets (80 mg total) by mouth at bedtime.  Dispense: 180 tablet; Refill: 1     Dale Stapleton, MD

## 2022-08-01 NOTE — Assessment & Plan Note (Signed)
Carotid ultrasound 01/2022 - Duplex ultrasound shows <40% stenosis bilaterally. Recommended f/u one year.

## 2022-08-02 NOTE — Progress Notes (Signed)
Remote pacemaker transmission.   

## 2022-08-04 ENCOUNTER — Other Ambulatory Visit: Payer: Self-pay

## 2022-08-06 ENCOUNTER — Encounter: Payer: Self-pay | Admitting: Internal Medicine

## 2022-08-06 NOTE — Assessment & Plan Note (Signed)
Continue protonix  

## 2022-08-06 NOTE — Assessment & Plan Note (Signed)
Continue lovastatin.  Follow lipid panel and liver function tests.   

## 2022-08-06 NOTE — Assessment & Plan Note (Signed)
Breathing stable.

## 2022-08-06 NOTE — Assessment & Plan Note (Addendum)
Followed by cardiology.  ECHO - 05/2019 - EF 55-60%.  Continues on losartan, imdur and amlodipine.  Blood pressure doing well.  Overall has been doing better.  Lasix dose recently adjusted by cardiology.  Discussed monitoring diet - avoid increased sodium.  Weight daily.  Follow.  Call with problems.

## 2022-08-06 NOTE — Assessment & Plan Note (Signed)
Dr Arida - 01/12/22 -  Cardiac catheterization in December showed patent stents with no obstructive disease. Continue plavix.  Aspirin stopped.  

## 2022-08-06 NOTE — Assessment & Plan Note (Signed)
Appears to be in SR today.  Follow.  

## 2022-08-06 NOTE — Assessment & Plan Note (Signed)
Followed by hematology.  Has received IV iron.  Follow.  

## 2022-08-06 NOTE — Assessment & Plan Note (Signed)
Blood pressure as outlined.  On imdur, amlodipine and losartan.  Continue to follow pressure.  Continue to monitor metabolic panel.   

## 2022-08-06 NOTE — Assessment & Plan Note (Signed)
Evaluated by [facility].  Evaluated 01/2020.  Stable.  Recommended f/u scanning in 2 years. F/u with vascular.

## 2022-08-06 NOTE — Assessment & Plan Note (Signed)
Follow met b and a1c.  

## 2022-08-06 NOTE — Assessment & Plan Note (Signed)
Continue cpap.  

## 2022-08-06 NOTE — Assessment & Plan Note (Addendum)
Continue to avoid antiinflammatories.  Follow metabolic panel.   

## 2022-08-07 ENCOUNTER — Inpatient Hospital Stay: Payer: Medicare HMO | Attending: Internal Medicine

## 2022-08-07 DIAGNOSIS — D509 Iron deficiency anemia, unspecified: Secondary | ICD-10-CM | POA: Insufficient documentation

## 2022-08-07 DIAGNOSIS — D500 Iron deficiency anemia secondary to blood loss (chronic): Secondary | ICD-10-CM

## 2022-08-07 DIAGNOSIS — C61 Malignant neoplasm of prostate: Secondary | ICD-10-CM

## 2022-08-07 LAB — CBC WITH DIFFERENTIAL (CANCER CENTER ONLY)
Abs Immature Granulocytes: 0.02 10*3/uL (ref 0.00–0.07)
Basophils Absolute: 0 10*3/uL (ref 0.0–0.1)
Basophils Relative: 1 %
Eosinophils Absolute: 0.3 10*3/uL (ref 0.0–0.5)
Eosinophils Relative: 7 %
HCT: 34.8 % — ABNORMAL LOW (ref 39.0–52.0)
Hemoglobin: 11.7 g/dL — ABNORMAL LOW (ref 13.0–17.0)
Immature Granulocytes: 0 %
Lymphocytes Relative: 29 %
Lymphs Abs: 1.3 10*3/uL (ref 0.7–4.0)
MCH: 30 pg (ref 26.0–34.0)
MCHC: 33.6 g/dL (ref 30.0–36.0)
MCV: 89.2 fL (ref 80.0–100.0)
Monocytes Absolute: 0.5 10*3/uL (ref 0.1–1.0)
Monocytes Relative: 12 %
Neutro Abs: 2.3 10*3/uL (ref 1.7–7.7)
Neutrophils Relative %: 51 %
Platelet Count: 161 10*3/uL (ref 150–400)
RBC: 3.9 MIL/uL — ABNORMAL LOW (ref 4.22–5.81)
RDW: 13.1 % (ref 11.5–15.5)
WBC Count: 4.6 10*3/uL (ref 4.0–10.5)
nRBC: 0 % (ref 0.0–0.2)

## 2022-08-07 LAB — FERRITIN: Ferritin: 31 ng/mL (ref 24–336)

## 2022-08-07 LAB — IRON AND TIBC
Iron: 58 ug/dL (ref 45–182)
Saturation Ratios: 17 % — ABNORMAL LOW (ref 17.9–39.5)
TIBC: 339 ug/dL (ref 250–450)
UIBC: 281 ug/dL

## 2022-08-07 LAB — PSA: Prostatic Specific Antigen: 0.01 ng/mL (ref 0.00–4.00)

## 2022-08-08 ENCOUNTER — Other Ambulatory Visit: Payer: Self-pay

## 2022-08-08 DIAGNOSIS — C61 Malignant neoplasm of prostate: Secondary | ICD-10-CM

## 2022-08-09 ENCOUNTER — Inpatient Hospital Stay (HOSPITAL_BASED_OUTPATIENT_CLINIC_OR_DEPARTMENT_OTHER): Payer: Medicare HMO | Admitting: Internal Medicine

## 2022-08-09 ENCOUNTER — Encounter: Payer: Self-pay | Admitting: Internal Medicine

## 2022-08-09 ENCOUNTER — Inpatient Hospital Stay: Payer: Medicare HMO

## 2022-08-09 VITALS — BP 117/78 | HR 89 | Temp 98.0°F | Ht 70.0 in | Wt 218.0 lb

## 2022-08-09 VITALS — BP 113/69 | HR 75 | Resp 18

## 2022-08-09 DIAGNOSIS — C61 Malignant neoplasm of prostate: Secondary | ICD-10-CM

## 2022-08-09 DIAGNOSIS — D509 Iron deficiency anemia, unspecified: Secondary | ICD-10-CM | POA: Diagnosis not present

## 2022-08-09 MED ORDER — SODIUM CHLORIDE 0.9 % IV SOLN
200.0000 mg | Freq: Once | INTRAVENOUS | Status: AC
  Administered 2022-08-09: 200 mg via INTRAVENOUS
  Filled 2022-08-09: qty 200

## 2022-08-09 MED ORDER — SODIUM CHLORIDE 0.9 % IV SOLN
Freq: Once | INTRAVENOUS | Status: AC
  Filled 2022-08-09: qty 250

## 2022-08-09 NOTE — Progress Notes (Signed)
[facility] OFFICE PROGRESS NOTE  Patient Care Team: Dale [place], MD as PCP - General (Internal Medicine) Iran Ouch, MD as PCP - Cardiology (Cardiology) Iran Ouch, MD as Consulting Physician (Cardiology) Earna Coder, MD as Consulting Physician (Hematology and Oncology)   Cancer Staging  No matching staging information was found for the patient.   Oncology History Overview Note  Nov 2017- completed IM RT radiation therapy to his prostate and pelvic nodes for Gleason 7 (4+3) adenocarcinoma the prostate presenting the PSA of 7.8.  # JAN 2019- METASTATIC PROSTATE CA to Bone [low volume met on bone scan; CT- NED]; PSA ~50; March 25th 2019- Lupron 45 mg IM [Urology q 42M]; June 26, 2018-Eligard every 3 months[intolerance to Lupron/question A. Fib/injection site pain  # May 23rd Zytiga 250mg /day; no prednisone; STOPPED MARCH 17th 2021-repeated severe hypokalemia; Eligard  # Barretts- EGD/ colo Ivin Poot 2021; Dr.Byrnett].   # Charm Rings, [place]]; Oct 2019-paroxysmal A.fib/not on eliquis; [Dr.Khan] -Dr.Arida ; Dizzy spells [Dr.Shah] s/p PPM [2023]  ------------------------------------------------------------------  DIAGNOSIS: [ JAN 2019]- Met- PROSTATE CANCER  STAGE:  IV       ;GOALS: PALLIATIVE     Prostate cancer (HCC)    INTERVAL HISTORY: Patient is with his wife.  He is walking with a cane.   Nathaniel Thompson 87 y.o.  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  Hx of hemorroids- blood on wiping; resolved with prep H.   Patient has chronic mild shortness of breath. Complains of fatigue.  Continues to have chronic joint pains not any worse. Denies any dizzy spells or falls.   Otherwise no worsening hot flashes or joint pains. Patient has gained weight.  No difficulty with urination.    Review of  Systems  Constitutional:  Negative for chills, diaphoresis, fever, malaise/fatigue and weight loss.  HENT:  Negative for nosebleeds and sore throat.   Eyes:  Negative for double vision.  Respiratory:  Negative for cough, hemoptysis, sputum production, shortness of breath and wheezing.   Cardiovascular:  Negative for chest pain, palpitations, orthopnea and leg swelling.  Gastrointestinal:  Negative for abdominal pain, blood in stool, constipation, diarrhea, heartburn, melena, nausea and vomiting.  Genitourinary:  Negative for dysuria, frequency and urgency.  Musculoskeletal:  Positive for myalgias. Negative for back pain and joint pain.  Skin: Negative.  Negative for itching and rash.  Neurological:  Negative for tingling, focal weakness, weakness and headaches.  Endo/Heme/Allergies:  Does not bruise/bleed easily.  Psychiatric/Behavioral:  Negative for depression. The patient is not nervous/anxious and does not have insomnia.     PAST MEDICAL HISTORY :  Past Medical History:  Diagnosis Date   (HFpEF) heart failure with preserved ejection fraction (HCC)    a. 05/2018 Echo: Nl EF, Gr1 DD, sev dil LA, mild MR/TR, mild PAH; b. 05/2019 Echo: EF 55-60%, no rwma, mild LVH. Nl PASP. Mildly dil LA. Triv MR. Mild AoV sclerosis w/o stenosis. Mildly dil Asc AO (39mm).   Allergic rhinitis    Barrett's esophagus 10/21/2013   Bone cancer (HCC)    CKD (chronic kidney disease), stage III (HCC)    Colon polyp, hyperplastic    COPD (chronic obstructive pulmonary disease) (HCC)    mild COPD. former smoker   Coronary artery disease    a. 2003 s/p PCI ([facility]); b. 2004 s/p PCI x 2 ([facility]); c. 11/2017 Cath:  LM nl, LAD mild dzs, LCX patent stent w/ 30% distal edge restenosis, RCA patent distal stent w/ 30% prox edge stenosis. Nl EF->Med Rx;   Diverticulosis    Fundic gland polyps of stomach, benign    Gastritis    GERD (gastroesophageal reflux disease)    Hypercholesteremia    Hypertension    Prostate cancer  (HCC)    Prostatism    Reflux esophagitis    Renal stones    Skin cancer    left cheek/lesion excised   Sleep apnea    Tachycardia    a. 11/2017 admit w/ tachycardia/? afib-->no afib noted upon review (amio/OAC d/c'd).   TIA (transient ischemic attack)     PAST SURGICAL HISTORY :   Past Surgical History:  Procedure Laterality Date   CARDIAC CATHETERIZATION     COLONOSCOPY WITH PROPOFOL N/A 09/13/2015   Procedure: COLONOSCOPY WITH PROPOFOL;  Surgeon: Christena Deem, MD;  Location: [facility] ENDOSCOPY;  Service: Endoscopy;  Laterality: N/A;   COLONOSCOPY WITH PROPOFOL N/A 05/07/2019   Procedure: COLONOSCOPY WITH PROPOFOL;  Surgeon: Earline Mayotte, MD;  Location: [facility] ENDOSCOPY;  Service: Endoscopy;  Laterality: N/A;   CORONARY ANGIOPLASTY     ESOPHAGOGASTRODUODENOSCOPY (EGD) WITH PROPOFOL N/A 09/13/2015   Procedure: ESOPHAGOGASTRODUODENOSCOPY (EGD) WITH PROPOFOL;  Surgeon: Christena Deem, MD;  Location: [facility] ENDOSCOPY;  Service: Endoscopy;  Laterality: N/A;   ESOPHAGOGASTRODUODENOSCOPY (EGD) WITH PROPOFOL N/A 12/28/2015   Procedure: ESOPHAGOGASTRODUODENOSCOPY (EGD) WITH PROPOFOL;  Surgeon: Christena Deem, MD;  Location: [facility] ENDOSCOPY;  Service: Endoscopy;  Laterality: N/A;   ESOPHAGOGASTRODUODENOSCOPY (EGD) WITH PROPOFOL N/A 06/16/2016   Procedure: ESOPHAGOGASTRODUODENOSCOPY (EGD) WITH PROPOFOL;  Surgeon: Christena Deem, MD;  Location: [facility] ENDOSCOPY;  Service: Endoscopy;  Laterality: N/A;   ESOPHAGOGASTRODUODENOSCOPY (EGD) WITH PROPOFOL N/A 05/07/2019   Procedure: ESOPHAGOGASTRODUODENOSCOPY (EGD) WITH PROPOFOL;  Surgeon: Earline Mayotte, MD;  Location: [facility] ENDOSCOPY;  Service: Endoscopy;  Laterality: N/A;   EYE SURGERY  2013   CATARACT EXTRACTION   GANGLION CYST EXCISION Right 05/18/2017   Procedure: REMOVAL GANGLION CYST ANKLE;  Surgeon: Gwyneth Revels, DPM;  Location: [facility] ORS;  Service: Podiatry;  Laterality: Right;   heart cath stent     LEFT HEART CATH AND CORONARY  ANGIOGRAPHY Right 12/03/2017   Procedure: Left Heart Cath and Coronary Angiography with possible coronary intervention;  Surgeon: Laurier Nancy, MD;  Location: [facility] INVASIVE CV LAB;  Service: Cardiovascular;  Laterality: Right;   PACEMAKER IMPLANT N/A 04/04/2021   Procedure: PACEMAKER IMPLANT;  Surgeon: [facility] Salvia, MD;  Location: [facility] INVASIVE CV LAB;  Service: Cardiovascular;  Laterality: N/A;   PROSTATE BIOPSY     RIGHT/LEFT HEART CATH AND CORONARY ANGIOGRAPHY N/A 02/07/2021   Procedure: RIGHT/LEFT HEART CATH AND CORONARY ANGIOGRAPHY;  Surgeon: Iran Ouch, MD;  Location: [facility] INVASIVE CV LAB;  Service: Cardiovascular;  Laterality: N/A;   stents     multiple    FAMILY HISTORY :   Family History  Problem Relation Age of Onset   Cancer Mother        gastric and lung   Cancer Father        multiple myeloma   Stroke Father    Cancer Sister        leukemia   Cancer Brother        leukemia   Cancer Brother        kidney   Cancer Daughter        Uterine   Cancer Other  Nephes (Sister's Son): Prostate    SOCIAL HISTORY:   Social History   Tobacco Use   Smoking status: Former    Packs/day: 1.00    Years: 35.00    Additional pack years: 0.00    Total pack years: 35.00    Types: Cigarettes    Quit date: 03/06/1986    Years since quitting: 36.4   Smokeless tobacco: Former    Types: Associate Professor Use: Never used  Substance Use Topics   Alcohol use: No   Drug use: No    ALLERGIES:  is allergic to atorvastatin.  MEDICATIONS:  Current Outpatient Medications  Medication Sig Dispense Refill   amLODipine (NORVASC) 2.5 MG tablet TAKE 1 TABLET BY MOUTH ONCE DAILY *REDUCED DOSE* 90 tablet 0   apixaban (ELIQUIS) 5 MG TABS tablet Take 1 tablet (5 mg total) by mouth 2 (two) times daily. 90 tablet 1   Calcium Carb-Cholecalciferol (CALCIUM + D3 PO) Take 1 tablet by mouth daily.     cholecalciferol (VITAMIN D) 1000 units tablet Take 1,000 Units by mouth  daily.      ferrous sulfate 324 MG TBEC Take 324 mg by mouth daily.     furosemide (LASIX) 40 MG tablet Take 1 tablet (40 mg total) by mouth every other day. 45 tablet 3   isosorbide mononitrate (IMDUR) 30 MG 24 hr tablet Take 1 tablet (30 mg total) by mouth daily. 90 tablet 2   Leuprolide Acetate, 3 Month, (ELIGARD) 22.5 MG injection Inject 22.5 mg into the skin every 4 (four) months.     losartan (COZAAR) 50 MG tablet TAKE 1 TABLET BY MOUTH ONCE DAILY 90 tablet 0   lovastatin (MEVACOR) 40 MG tablet Take 2 tablets (80 mg total) by mouth at bedtime. 180 tablet 1   metoprolol succinate (TOPROL-XL) 25 MG 24 hr tablet TAKE 1 TABLET BY MOUTH ONCE DAILY 90 tablet 1   Multiple Vitamin (MULTI-VITAMINS) TABS Take 1 tablet by mouth daily.      pantoprazole (PROTONIX) 40 MG tablet Take 40 mg by mouth 2 (two) times daily.      potassium chloride (KLOR-CON M) 10 MEQ tablet Take 1 tablet by mouth every other day with Furosemide. 45 tablet 3   No current facility-administered medications for this visit.    PHYSICAL EXAMINATION: ECOG PERFORMANCE STATUS: 0 - Asymptomatic  BP 117/78 (BP Location: Left Arm, Patient Position: Sitting, Cuff Size: Normal)   Pulse 89   Temp 98 F (36.7 C)   Ht 5\' 10"  (1.778 m)   Wt 218 lb (98.9 kg)   SpO2 96%   BMI 31.28 kg/m   Filed Weights   08/09/22 1502  Weight: 218 lb (98.9 kg)   Patient in wheelchair; left leg in the brace  Physical Exam HENT:     Head: Normocephalic and atraumatic.     Mouth/Throat:     Pharynx: No oropharyngeal exudate.  Eyes:     Pupils: Pupils are equal, round, and reactive to light.  Cardiovascular:     Rate and Rhythm: Normal rate and regular rhythm.  Pulmonary:     Effort: No respiratory distress.     Breath sounds: No wheezing.  Abdominal:     General: Bowel sounds are normal. There is no distension.     Palpations: Abdomen is soft. There is no mass.     Tenderness: There is no abdominal tenderness. There is no guarding or  rebound.  Musculoskeletal:  General: No tenderness. Normal range of motion.     Cervical back: Normal range of motion and neck supple.  Skin:    General: Skin is warm.  Neurological:     Mental Status: He is alert and oriented to person, place, and time.  Psychiatric:        Mood and Affect: Affect normal.     LABORATORY DATA:  I have reviewed the data as listed    Component Value Date/Time   NA 141 07/28/2022 0744   NA 141 02/04/2021 0810   NA 140 09/16/2013 0438   K 4.3 07/28/2022 0744   K 4.2 09/16/2013 0438   CL 107 07/28/2022 0744   CL 109 (H) 09/16/2013 0438   CO2 24 07/28/2022 0744   CO2 24 09/16/2013 0438   GLUCOSE 101 (H) 07/28/2022 0744   GLUCOSE 101 (H) 09/16/2013 0438   BUN 25 (H) 07/28/2022 0744   BUN 18 02/04/2021 0810   BUN 20 (H) 09/16/2013 0438   CREATININE 1.35 07/28/2022 0744   CREATININE 1.28 09/16/2013 0438   CALCIUM 9.4 07/28/2022 0744   CALCIUM 8.3 (L) 09/16/2013 0438   PROT 6.2 07/28/2022 0744   PROT 6.7 09/15/2013 0755   ALBUMIN 3.8 07/28/2022 0744   ALBUMIN 3.5 09/15/2013 0755   AST 16 07/28/2022 0744   AST 37 09/15/2013 0755   ALT 11 07/28/2022 0744   ALT 28 09/15/2013 0755   ALKPHOS 47 07/28/2022 0744   ALKPHOS 44 (L) 09/15/2013 0755   BILITOT 0.3 07/28/2022 0744   BILITOT 0.5 09/15/2013 0755   GFRNONAA 49 (L) 05/29/2022 1139   GFRNONAA 54 (L) 09/16/2013 0438   GFRAA 59 (L) 10/07/2019 1305   GFRAA >60 09/16/2013 0438    No results found for: "SPEP", "UPEP"  Lab Results  Component Value Date   WBC 4.6 08/07/2022   NEUTROABS 2.3 08/07/2022   HGB 11.7 (L) 08/07/2022   HCT 34.8 (L) 08/07/2022   MCV 89.2 08/07/2022   PLT 161 08/07/2022      Chemistry      Component Value Date/Time   NA 141 07/28/2022 0744   NA 141 02/04/2021 0810   NA 140 09/16/2013 0438   K 4.3 07/28/2022 0744   K 4.2 09/16/2013 0438   CL 107 07/28/2022 0744   CL 109 (H) 09/16/2013 0438   CO2 24 07/28/2022 0744   CO2 24 09/16/2013 0438   BUN 25  (H) 07/28/2022 0744   BUN 18 02/04/2021 0810   BUN 20 (H) 09/16/2013 0438   CREATININE 1.35 07/28/2022 0744   CREATININE 1.28 09/16/2013 0438      Component Value Date/Time   CALCIUM 9.4 07/28/2022 0744   CALCIUM 8.3 (L) 09/16/2013 0438   ALKPHOS 47 07/28/2022 0744   ALKPHOS 44 (L) 09/15/2013 0755   AST 16 07/28/2022 0744   AST 37 09/15/2013 0755   ALT 11 07/28/2022 0744   ALT 28 09/15/2013 0755   BILITOT 0.3 07/28/2022 0744   BILITOT 0.5 09/15/2013 0755       RADIOGRAPHIC STUDIES: I have personally reviewed the radiological images as listed and agreed with the findings in the report. No results found.   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 Stage IV-on Eligard; Bone scan October 2020-improved;  SEP 2023 PSA- <0.1.  Currently getting intermittent Eligard. JUNE 2024- PSA=0.01.  Hold Eligard today.  If significantly elevated would recommend Eligard. If PSA rising would consider bone scan.   #  Iron deficient anemia/CKD- III-[s/p EGD March 2021]--status post IV iron infusion x1 [FEB 2022]-JUNE 2024-hemoglobin is 11.7- iron sat 17%; ferritin-31 Overall stable.  Continue oral iron.; but proceed with venofer today.   # Nov 26th, 2023-acute PE -lobar and segmental PE [[facility]]/ A.fib - on Eliquis [no asprin/plavix]-provoked left foot surgery. However, pt on indefinite Eliquis 5 mg twice daily. Stable.   #CKD stage III-GFR 50s.  Clinically stable.  Eligard 22.5 q4 m- ? nov 2023.  # DISPOSITION:  # HOLD eligard; # Venofer today # Follow up in 3 months-  MD; 2-3 days prior- labs- cbc.cmp/iron studies; ferritin; PSA eligard; possible venofer- Dr.B  Cc; Dr.Scott/  Orders Placed This Encounter  Procedures   CBC with Differential (Cancer Center Only)    Standing Status:   Future    Standing Expiration Date:   08/09/2023   CMP (Cancer Center only)    Standing Status:   Future    Standing Expiration Date:   08/09/2023   Iron  and TIBC    Standing Status:   Future    Standing Expiration Date:   08/09/2023   Ferritin    Standing Status:   Future    Standing Expiration Date:   08/09/2023   PSA    Standing Status:   Future    Standing Expiration Date:   08/09/2023   All questions were answered. The patient knows to call the clinic with any problems, questions or concerns.      Earna Coder, MD 08/09/2022 6:05 PM

## 2022-08-09 NOTE — Progress Notes (Signed)
Pt states it has been about 4 years since last bone scan, does he need another one?

## 2022-08-09 NOTE — Patient Instructions (Signed)

## 2022-08-09 NOTE — Assessment & Plan Note (Addendum)
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  Currently getting intermittent Eligard. JUNE 2024- PSA=0.01.  Hold Eligard today.  If significantly elevated would recommend Eligard. If PSA rising would consider bone scan.   # Iron deficient anemia/CKD- III-[s/p EGD March 2021]--status post IV iron infusion x1 [FEB 2022]-JUNE 2024-hemoglobin is 11.7- iron sat 17%; ferritin-31 Overall stable.  Continue oral iron.; but proceed with venofer today.   # Nov 26th, 2023-acute PE -lobar and segmental PE [[facility]]/ A.fib - on Eliquis [no asprin/plavix]-provoked left foot surgery. However, pt on indefinite Eliquis 5 mg twice daily. Stable.   #CKD stage III-GFR 50s.  Clinically stable.  Eligard 22.5 q4 m- ? nov 2023.  # DISPOSITION:  # HOLD eligard; # Venofer today # Follow up in 3 months-  MD; 2-3 days prior- labs- cbc.cmp/iron studies; ferritin; PSA eligard; possible venofer- Dr.B  Cc; Dr.Scott/

## 2022-08-12 ENCOUNTER — Other Ambulatory Visit: Payer: Self-pay | Admitting: Cardiovascular Disease

## 2022-08-14 NOTE — Telephone Encounter (Signed)
Prescription refill request for Eliquis received. Indication:AFIB Last office visit:3/24 Scr:1.35  5/24 Age: 87 Weight:98.9  kg  Prescription refilled

## 2022-08-14 NOTE — Telephone Encounter (Signed)
Refill request

## 2022-08-18 ENCOUNTER — Telehealth: Payer: Self-pay

## 2022-08-18 NOTE — Telephone Encounter (Signed)
Called and spoke to Ms Hufnagle and explained the appt

## 2022-08-18 NOTE — Telephone Encounter (Signed)
Patients wife sent my chart( in her chart) regarding an appt with [facility]. He has been scheduled for 6/26 but in review it looks like he is not due to follow up with them until 01/2023. Please confirm this and let me know so I can let them know if he needs to keep this appt.

## 2022-08-24 ENCOUNTER — Other Ambulatory Visit (INDEPENDENT_AMBULATORY_CARE_PROVIDER_SITE_OTHER): Payer: Self-pay | Admitting: Nurse Practitioner

## 2022-08-24 DIAGNOSIS — I714 Abdominal aortic aneurysm, without rupture, unspecified: Secondary | ICD-10-CM

## 2022-08-30 ENCOUNTER — Ambulatory Visit (INDEPENDENT_AMBULATORY_CARE_PROVIDER_SITE_OTHER): Payer: Medicare HMO

## 2022-08-30 ENCOUNTER — Other Ambulatory Visit: Payer: Self-pay | Admitting: Cardiovascular Disease

## 2022-08-30 DIAGNOSIS — I714 Abdominal aortic aneurysm, without rupture, unspecified: Secondary | ICD-10-CM | POA: Diagnosis not present

## 2022-10-04 ENCOUNTER — Encounter (INDEPENDENT_AMBULATORY_CARE_PROVIDER_SITE_OTHER): Payer: Self-pay

## 2022-10-05 ENCOUNTER — Other Ambulatory Visit: Payer: Self-pay | Admitting: Cardiovascular Disease

## 2022-10-10 ENCOUNTER — Ambulatory Visit (INDEPENDENT_AMBULATORY_CARE_PROVIDER_SITE_OTHER): Payer: Medicare HMO

## 2022-10-10 DIAGNOSIS — I472 Ventricular tachycardia, unspecified: Secondary | ICD-10-CM

## 2022-10-18 ENCOUNTER — Encounter: Payer: Self-pay | Admitting: Emergency Medicine

## 2022-10-18 NOTE — Progress Notes (Unsigned)
Cardiology Office Note   Date:  10/19/2022   ID:  Nathaniel Thompson, DOB Jul 26, 1935, MRN [id number]  PCP:  Dale Suffern, MD  Cardiologist:   Lorine Bears, MD   Chief Complaint  Patient presents with   Follow-up    6 mo f/u, pt feels well today, however states he does have continued sob on exertion, meds reviewed.        History of Present Illness: Nathaniel Thompson is a 87 y.o. male who presents for a follow-up visit regarding coronary artery disease and history of pacemaker. He has chronic medical conditions including TIA, previous tobacco use, metastatic prostate cancer, small abdominal aortic aneurysm followed by vascular surgery, chronic kidney disease, hyperlipidemia and essential hypertension.  He has known history of coronary artery disease status post stenting of the left circumflex and RCA.  He had initial PCI done in 2003 at [facility]  He had repeat PCI x2 at [facility] in 2004.   He was hospitalized in September 2019 with questionable tachycardia and atrial fibrillation.  Initially was placed on amiodarone and anticoagulation but all his EKGs revealed sinus rhythm at that time and thus both amiodarone and Eliquis were discontinued.     Echocardiogram in March 2020 showed normal LV systolic function with grade 1 diastolic dysfunction, severely dilated left atrium, mild mitral and tricuspid regurgitation and mild pulmonary hypertension.  He had worsening chest pain and shortness of breath in December 2022.  Cardiac catheterization showed patent stents in the left circumflex and right coronary artery with no obstructive coronary artery disease.  Right heart catheterization showed mildly elevated filling pressures, mild pulmonary hypertension and normal cardiac output.  The patient was noted to have intermittent bradycardia during cardiac catheterization with heart rate going to the low 40s.    He underwent outpatient monitor which showed frequent episodes of SVT.  He was referred  to EP and underwent pacemaker placement in January 2023.  In addition, he was placed on furosemide for diastolic heart failure.  He fell from a deer stand in late 2023 and fractured his left posterior calcaneus.  He was subsequently hospitalized at [facility]  with shortness of breath, cough and fever in the setting of his  displaced fracture of left posterior calcaneus.  He was found to have RSV and lobar and segmental pulmonary embolism.    He was treated with anticoagulation.  He was noted to have episodes of tachycardia and was found to be in atrial fibrillation with rapid ventricular response.  This lasted for about 45 minutes.  He was placed on small dose metoprolol.    He has been doing reasonably well with no chest pain or worsening dyspnea.  His blood pressure has been running on the low side.   Past Medical History:  Diagnosis Date   (HFpEF) heart failure with preserved ejection fraction (HCC)    a. 05/2018 Echo: Nl EF, Gr1 DD, sev dil LA, mild MR/TR, mild PAH; b. 05/2019 Echo: EF 55-60%, no rwma, mild LVH. Nl PASP. Mildly dil LA. Triv MR. Mild AoV sclerosis w/o stenosis. Mildly dil Asc AO (39mm).   Allergic rhinitis    Barrett's esophagus 10/21/2013   Bone cancer (HCC)    CKD (chronic kidney disease), stage III (HCC)    Colon polyp, hyperplastic    COPD (chronic obstructive pulmonary disease) (HCC)    mild COPD. former smoker   Coronary artery disease    a. 2003 s/p PCI ([facility]); b. 2004 s/p PCI x 2 ([facility]);  c. 11/2017 Cath: LM nl, LAD mild dzs, LCX patent stent w/ 30% distal edge restenosis, RCA patent distal stent w/ 30% prox edge stenosis. Nl EF->Med Rx;   Diverticulosis    Fundic gland polyps of stomach, benign    Gastritis    GERD (gastroesophageal reflux disease)    Hypercholesteremia    Hypertension    Prostate cancer (HCC)    Prostatism    Reflux esophagitis    Renal stones    Skin cancer    left cheek/lesion excised   Sleep apnea    Tachycardia    a. 11/2017 admit w/  tachycardia/? afib-->no afib noted upon review (amio/OAC d/c'd).   TIA (transient ischemic attack)     Past Surgical History:  Procedure Laterality Date   CARDIAC CATHETERIZATION     COLONOSCOPY WITH PROPOFOL N/A 09/13/2015   Procedure: COLONOSCOPY WITH PROPOFOL;  Surgeon: Christena Deem, MD;  Location: [facility] ENDOSCOPY;  Service: Endoscopy;  Laterality: N/A;   COLONOSCOPY WITH PROPOFOL N/A 05/07/2019   Procedure: COLONOSCOPY WITH PROPOFOL;  Surgeon: Earline Mayotte, MD;  Location: [facility] ENDOSCOPY;  Service: Endoscopy;  Laterality: N/A;   CORONARY ANGIOPLASTY     ESOPHAGOGASTRODUODENOSCOPY (EGD) WITH PROPOFOL N/A 09/13/2015   Procedure: ESOPHAGOGASTRODUODENOSCOPY (EGD) WITH PROPOFOL;  Surgeon: Christena Deem, MD;  Location: [facility] ENDOSCOPY;  Service: Endoscopy;  Laterality: N/A;   ESOPHAGOGASTRODUODENOSCOPY (EGD) WITH PROPOFOL N/A 12/28/2015   Procedure: ESOPHAGOGASTRODUODENOSCOPY (EGD) WITH PROPOFOL;  Surgeon: Christena Deem, MD;  Location: [facility] ENDOSCOPY;  Service: Endoscopy;  Laterality: N/A;   ESOPHAGOGASTRODUODENOSCOPY (EGD) WITH PROPOFOL N/A 06/16/2016   Procedure: ESOPHAGOGASTRODUODENOSCOPY (EGD) WITH PROPOFOL;  Surgeon: Christena Deem, MD;  Location: [facility] ENDOSCOPY;  Service: Endoscopy;  Laterality: N/A;   ESOPHAGOGASTRODUODENOSCOPY (EGD) WITH PROPOFOL N/A 05/07/2019   Procedure: ESOPHAGOGASTRODUODENOSCOPY (EGD) WITH PROPOFOL;  Surgeon: Earline Mayotte, MD;  Location: [facility] ENDOSCOPY;  Service: Endoscopy;  Laterality: N/A;   EYE SURGERY  2013   CATARACT EXTRACTION   GANGLION CYST EXCISION Right 05/18/2017   Procedure: REMOVAL GANGLION CYST ANKLE;  Surgeon: Gwyneth Revels, DPM;  Location: [facility] ORS;  Service: Podiatry;  Laterality: Right;   heart cath stent     LEFT HEART CATH AND CORONARY ANGIOGRAPHY Right 12/03/2017   Procedure: Left Heart Cath and Coronary Angiography with possible coronary intervention;  Surgeon: Laurier Nancy, MD;  Location: [facility] INVASIVE CV LAB;  Service:  Cardiovascular;  Laterality: Right;   PACEMAKER IMPLANT N/A 04/04/2021   Procedure: PACEMAKER IMPLANT;  Surgeon: [facility] Salvia, MD;  Location: [facility] INVASIVE CV LAB;  Service: Cardiovascular;  Laterality: N/A;   PROSTATE BIOPSY     RIGHT/LEFT HEART CATH AND CORONARY ANGIOGRAPHY N/A 02/07/2021   Procedure: RIGHT/LEFT HEART CATH AND CORONARY ANGIOGRAPHY;  Surgeon: Iran Ouch, MD;  Location: [facility] INVASIVE CV LAB;  Service: Cardiovascular;  Laterality: N/A;   stents     multiple     Current Outpatient Medications  Medication Sig Dispense Refill   amLODipine (NORVASC) 2.5 MG tablet TAKE 1 TABLET BY MOUTH ONCE DAILY *REDUCED DOSE* 90 tablet 0   apixaban (ELIQUIS) 5 MG TABS tablet TAKE 1 TABLET BY MOUTH TWICE DAILY 180 tablet 1   Calcium Carb-Cholecalciferol (CALCIUM + D3 PO) Take 1 tablet by mouth daily.     cholecalciferol (VITAMIN D) 1000 units tablet Take 1,000 Units by mouth daily.      ferrous sulfate 324 MG TBEC Take 324 mg by mouth daily.     furosemide (LASIX) 40 MG tablet Take 1 tablet (  40 mg total) by mouth every other day. 45 tablet 3   isosorbide mononitrate (IMDUR) 30 MG 24 hr tablet Take 1 tablet (30 mg total) by mouth daily. 90 tablet 2   Leuprolide Acetate, 3 Month, (ELIGARD) 22.5 MG injection Inject 22.5 mg into the skin every 4 (four) months.     losartan (COZAAR) 50 MG tablet TAKE 1 TABLET BY MOUTH ONCE DAILY 90 tablet 0   lovastatin (MEVACOR) 40 MG tablet Take 2 tablets (80 mg total) by mouth at bedtime. 180 tablet 1   metoprolol succinate (TOPROL-XL) 25 MG 24 hr tablet TAKE 1 TABLET BY MOUTH ONCE DAILY 90 tablet 1   Multiple Vitamin (MULTI-VITAMINS) TABS Take 1 tablet by mouth daily.      pantoprazole (PROTONIX) 40 MG tablet Take 40 mg by mouth 2 (two) times daily.      potassium chloride (KLOR-CON M) 10 MEQ tablet Take 1 tablet by mouth every other day with Furosemide. 45 tablet 3   No current facility-administered medications for this visit.    Allergies:    Atorvastatin    Social History:  The patient  reports that he quit smoking about 36 years ago. His smoking use included cigarettes. He started smoking about 71 years ago. He has a 35 pack-year smoking history. He has quit using smokeless tobacco.  His smokeless tobacco use included chew. He reports that he does not drink alcohol and does not use drugs.   Family History:  The patient's family history includes Cancer in his brother, brother, daughter, father, mother, sister, and another family member; Stroke in his father.    ROS:  Please see the history of present illness.   Otherwise, review of systems are positive for none.   All other systems are reviewed and negative.    PHYSICAL EXAM: VS:  BP (!) 102/56 (BP Location: Left Arm, Patient Position: Sitting, Cuff Size: Normal)   Pulse 60   Ht 5\' 10"  (1.778 m)   Wt 218 lb 3.2 oz (99 kg)   SpO2 95%   BMI 31.31 kg/m  , BMI Body mass index is 31.31 kg/m. GEN: Well nourished, well developed, in no acute distress  HEENT: normal  Neck: no JVD, carotid bruits, or masses Cardiac: RRR; no murmurs, rubs, or gallops, mild bilateral lower extremity edema Respiratory:  clear to auscultation bilaterally, normal work of breathing GI: soft, nontender, nondistended, + BS MS: no deformity or atrophy  Skin: warm and dry, no rash Neuro:  Strength and sensation are intact Psych: euthymic mood, full affect   EKG:  EKG is ordered today. The ekg ordered today demonstrates : Atrial-paced rhythm with occasional Premature ventricular complexes Nonspecific ST and T wave abnormality When compared with ECG of 04-Apr-2021 18:40, Premature ventricular complexes are now Present    Recent Labs: 01/25/2022: TSH 2.15 07/28/2022: ALT 11; BUN 25; Creatinine, Ser 1.35; Potassium 4.3; Sodium 141 08/07/2022: Hemoglobin 11.7; Platelet Count 161    Lipid Panel    Component Value Date/Time   CHOL 112 07/28/2022 0744   CHOL 103 09/16/2013 0438   TRIG 196.0 (H)  07/28/2022 0744   TRIG 95 09/16/2013 0438   HDL 28.50 (L) 07/28/2022 0744   HDL 30 (L) 09/16/2013 0438   CHOLHDL 4 07/28/2022 0744   VLDL 39.2 07/28/2022 0744   VLDL 19 09/16/2013 0438   LDLCALC 45 07/28/2022 0744   LDLCALC 54 09/16/2013 0438      Wt Readings from Last 3 Encounters:  10/19/22 218 lb 3.2 oz (  99 kg)  08/09/22 218 lb (98.9 kg)  08/01/22 220 lb (99.8 kg)          04/15/2019    2:58 PM  PAD Screen  Previous PAD dx? No  Previous surgical procedure? No  Pain with walking? No  Feet/toe relief with dangling? No  Painful, non-healing ulcers? No  Extremities discolored? No      ASSESSMENT AND PLAN:  1.  Coronary artery disease involving native coronary arteries without angina: Cardiac catheterization in December of 2022 showed patent stents with no obstructive disease.  No antiplatelet medications given that he is on Eliquis.  2.  Chronic diastolic heart failure: He seems to be euvolemic on furosemide 40 mg every other day.  3.  Essential hypertension: Blood pressure has been running low.  I elected to discontinue amlodipine.  4.  Hyperlipidemia:  History of intolerance to potent statins.  He tolerates lovastatin 40 mg daily.  I reviewed most recent lipid profile which showed an LDL of 45.  5.  Small abdominal aortic aneurysm: Less than 4 cm.  Followed by vascular surgery.  This has been stable.  In addition, he has mild nonobstructive carotid disease.  6.  Symptomatic bradycardia: Status post permanent pacemaker placement.  The pacemaker seems to be functioning normally.  7.  Paroxysmal atrial fibrillation: I favor continued long-term anticoagulation but he is having hard time affording Eliquis.  Will help him apply for assistance.    Disposition: Follow-up in 6 months.   Signed,  Lorine Bears, MD  10/19/2022 8:12 AM     [facility]

## 2022-10-19 ENCOUNTER — Encounter: Payer: Self-pay | Admitting: Cardiovascular Disease

## 2022-10-19 ENCOUNTER — Telehealth: Payer: Self-pay | Admitting: Cardiovascular Disease

## 2022-10-19 ENCOUNTER — Ambulatory Visit: Payer: Medicare HMO | Attending: Cardiovascular Disease | Admitting: Cardiovascular Disease

## 2022-10-19 ENCOUNTER — Ambulatory Visit (INDEPENDENT_AMBULATORY_CARE_PROVIDER_SITE_OTHER): Payer: Medicare HMO | Admitting: Emergency Medicine

## 2022-10-19 VITALS — Ht 70.0 in | Wt 218.0 lb

## 2022-10-19 VITALS — BP 102/56 | HR 60 | Ht 70.0 in | Wt 218.2 lb

## 2022-10-19 DIAGNOSIS — I480 Paroxysmal atrial fibrillation: Secondary | ICD-10-CM | POA: Diagnosis not present

## 2022-10-19 DIAGNOSIS — R001 Bradycardia, unspecified: Secondary | ICD-10-CM

## 2022-10-19 DIAGNOSIS — E785 Hyperlipidemia, unspecified: Secondary | ICD-10-CM

## 2022-10-19 DIAGNOSIS — Z0000 Encounter for general adult medical examination without abnormal findings: Secondary | ICD-10-CM

## 2022-10-19 DIAGNOSIS — I10 Essential (primary) hypertension: Secondary | ICD-10-CM

## 2022-10-19 DIAGNOSIS — I5032 Chronic diastolic (congestive) heart failure: Secondary | ICD-10-CM

## 2022-10-19 DIAGNOSIS — I2510 Atherosclerotic heart disease of native coronary artery without angina pectoris: Secondary | ICD-10-CM

## 2022-10-19 MED ORDER — LOSARTAN POTASSIUM 50 MG PO TABS: 50.0000 mg | ORAL_TABLET | Freq: Every day | ORAL | 1 refills | Status: DC

## 2022-10-19 NOTE — Progress Notes (Signed)
Subjective:   Nathaniel Thompson is a 87 y.o. male who presents for Medicare Annual/Subsequent preventive examination.  Visit Complete: Virtual  I connected with  Sanjuana Kava on 10/19/22 by a audio enabled telemedicine application and verified that I am speaking with the correct person using two identifiers.  Patient Location: Other:  doctor's office  Provider Location: Home Office  I discussed the limitations of evaluation and management by telemedicine. The patient expressed understanding and agreed to proceed.  Vital Signs: Because this visit was a virtual/telehealth visit, some criteria may be missing or patient reported. Any vitals not documented were not able to be obtained and vitals that have been documented are patient reported.    Review of Systems     Cardiac Risk Factors include: advanced age (>59men, >73 women);male gender;hypertension;dyslipidemia;obesity (BMI >30kg/m2) (A. Fib, OSA cpap)     Objective:    Today's Vitals   10/19/22 1455  Weight: 218 lb (98.9 kg)  Height: 5\' 10"  (1.778 m)   Body mass index is 31.28 kg/m.     10/19/2022    3:11 PM 08/09/2022    3:02 PM 05/09/2022    1:15 PM 02/16/2022   11:04 AM 01/07/2022    8:27 AM 11/21/2021    1:16 PM 10/04/2021    1:40 PM  Advanced Directives  Does Patient Have a Medical Advance Directive? Yes Yes Yes Yes Yes Yes Yes  Type of Estate agent of [place];Living will Healthcare Power of [place];Living will Healthcare Power of [place];Living will  Healthcare Power of [place];Living will  Healthcare Power of [place];Living will  Does patient want to make changes to medical advance directive? No - Patient declined  No - Patient declined No - Patient declined   No - Patient declined  Copy of Healthcare Power of Attorney in Chart? No - copy requested  No - copy requested    No - copy requested    Current Medications (verified) Outpatient Encounter Medications as of 10/19/2022  Medication Sig    apixaban (ELIQUIS) 5 MG TABS tablet TAKE 1 TABLET BY MOUTH TWICE DAILY   Calcium Carb-Cholecalciferol (CALCIUM + D3 PO) Take 1 tablet by mouth daily.   cholecalciferol (VITAMIN D) 1000 units tablet Take 1,000 Units by mouth daily.    ferrous sulfate 324 MG TBEC Take 324 mg by mouth daily.   furosemide (LASIX) 40 MG tablet Take 1 tablet (40 mg total) by mouth every other day.   isosorbide mononitrate (IMDUR) 30 MG 24 hr tablet Take 1 tablet (30 mg total) by mouth daily.   losartan (COZAAR) 50 MG tablet Take 1 tablet (50 mg total) by mouth daily.   lovastatin (MEVACOR) 40 MG tablet Take 2 tablets (80 mg total) by mouth at bedtime.   metoprolol succinate (TOPROL-XL) 25 MG 24 hr tablet TAKE 1 TABLET BY MOUTH ONCE DAILY   Multiple Vitamin (MULTI-VITAMINS) TABS Take 1 tablet by mouth daily.    pantoprazole (PROTONIX) 40 MG tablet Take 40 mg by mouth 2 (two) times daily.    potassium chloride (KLOR-CON M) 10 MEQ tablet Take 1 tablet by mouth every other day with Furosemide.   Leuprolide Acetate, 3 Month, (ELIGARD) 22.5 MG injection Inject 22.5 mg into the skin every 4 (four) months. (Patient not taking: Reported on 10/19/2022)   No facility-administered encounter medications on file as of 10/19/2022.    Allergies (verified) Atorvastatin   History: Past Medical History:  Diagnosis Date   (HFpEF) heart failure with preserved ejection fraction (  HCC)    a. 05/2018 Echo: Nl EF, Gr1 DD, sev dil LA, mild MR/TR, mild PAH; b. 05/2019 Echo: EF 55-60%, no rwma, mild LVH. Nl PASP. Mildly dil LA. Triv MR. Mild AoV sclerosis w/o stenosis. Mildly dil Asc AO (39mm).   Allergic rhinitis    Barrett's esophagus 10/21/2013   Bone cancer (HCC)    CKD (chronic kidney disease), stage III (HCC)    Colon polyp, hyperplastic    COPD (chronic obstructive pulmonary disease) (HCC)    mild COPD. former smoker   Coronary artery disease    a. 2003 s/p PCI ([facility]); b. 2004 s/p PCI x 2 ([facility]); c. 11/2017 Cath: LM nl, LAD mild  dzs, LCX patent stent w/ 30% distal edge restenosis, RCA patent distal stent w/ 30% prox edge stenosis. Nl EF->Med Rx;   Diverticulosis    Fundic gland polyps of stomach, benign    Gastritis    GERD (gastroesophageal reflux disease)    Hypercholesteremia    Hypertension    Prostate cancer (HCC)    Prostatism    Reflux esophagitis    Renal stones    Skin cancer    left cheek/lesion excised   Sleep apnea    Tachycardia    a. 11/2017 admit w/ tachycardia/? afib-->no afib noted upon review (amio/OAC d/c'd).   TIA (transient ischemic attack)    Past Surgical History:  Procedure Laterality Date   CARDIAC CATHETERIZATION     COLONOSCOPY WITH PROPOFOL N/A 09/13/2015   Procedure: COLONOSCOPY WITH PROPOFOL;  Surgeon: Christena Deem, MD;  Location: [facility] ENDOSCOPY;  Service: Endoscopy;  Laterality: N/A;   COLONOSCOPY WITH PROPOFOL N/A 05/07/2019   Procedure: COLONOSCOPY WITH PROPOFOL;  Surgeon: Earline Mayotte, MD;  Location: [facility] ENDOSCOPY;  Service: Endoscopy;  Laterality: N/A;   CORONARY ANGIOPLASTY     ESOPHAGOGASTRODUODENOSCOPY (EGD) WITH PROPOFOL N/A 09/13/2015   Procedure: ESOPHAGOGASTRODUODENOSCOPY (EGD) WITH PROPOFOL;  Surgeon: Christena Deem, MD;  Location: [facility] ENDOSCOPY;  Service: Endoscopy;  Laterality: N/A;   ESOPHAGOGASTRODUODENOSCOPY (EGD) WITH PROPOFOL N/A 12/28/2015   Procedure: ESOPHAGOGASTRODUODENOSCOPY (EGD) WITH PROPOFOL;  Surgeon: Christena Deem, MD;  Location: [facility] ENDOSCOPY;  Service: Endoscopy;  Laterality: N/A;   ESOPHAGOGASTRODUODENOSCOPY (EGD) WITH PROPOFOL N/A 06/16/2016   Procedure: ESOPHAGOGASTRODUODENOSCOPY (EGD) WITH PROPOFOL;  Surgeon: Christena Deem, MD;  Location: [facility] ENDOSCOPY;  Service: Endoscopy;  Laterality: N/A;   ESOPHAGOGASTRODUODENOSCOPY (EGD) WITH PROPOFOL N/A 05/07/2019   Procedure: ESOPHAGOGASTRODUODENOSCOPY (EGD) WITH PROPOFOL;  Surgeon: Earline Mayotte, MD;  Location: [facility] ENDOSCOPY;  Service: Endoscopy;  Laterality: N/A;   EYE  SURGERY  2013   CATARACT EXTRACTION   GANGLION CYST EXCISION Right 05/18/2017   Procedure: REMOVAL GANGLION CYST ANKLE;  Surgeon: Gwyneth Revels, DPM;  Location: [facility] ORS;  Service: Podiatry;  Laterality: Right;   heart cath stent     LEFT HEART CATH AND CORONARY ANGIOGRAPHY Right 12/03/2017   Procedure: Left Heart Cath and Coronary Angiography with possible coronary intervention;  Surgeon: Laurier Nancy, MD;  Location: [facility] INVASIVE CV LAB;  Service: Cardiovascular;  Laterality: Right;   PACEMAKER IMPLANT N/A 04/04/2021   Procedure: PACEMAKER IMPLANT;  Surgeon: [facility] Salvia, MD;  Location: [facility] INVASIVE CV LAB;  Service: Cardiovascular;  Laterality: N/A;   PROSTATE BIOPSY     RIGHT/LEFT HEART CATH AND CORONARY ANGIOGRAPHY N/A 02/07/2021   Procedure: RIGHT/LEFT HEART CATH AND CORONARY ANGIOGRAPHY;  Surgeon: Iran Ouch, MD;  Location: [facility] INVASIVE CV LAB;  Service: Cardiovascular;  Laterality: N/A;   stents  multiple   Family History  Problem Relation Age of Onset   Cancer Mother        gastric and lung   Cancer Father        multiple myeloma   Stroke Father    Cancer Sister        leukemia   Cancer Brother        leukemia   Cancer Brother        kidney   Cancer Daughter        Uterine   Cancer Other        [place] (Sister's Son): Prostate   Social History   Socioeconomic History   Marital status: Married    Spouse name: Glenda   Number of children: 2   Years of education: Not on file   Highest education level: Not on file  Occupational History   Occupation: retired Production designer, theatre/television/film for [employer]  Tobacco Use   Smoking status: Former    Current packs/day: 0.00    Average packs/day: 1 pack/day for 35.0 years (35.0 ttl pk-yrs)    Types: Cigarettes    Start date: 03/07/1951    Quit date: 03/06/1986    Years since quitting: 36.6   Smokeless tobacco: Former    Types: Chew    Quit date: 1985  Vaping Use   Vaping status: Never Used  Substance and Sexual Activity    Alcohol use: No   Drug use: No   Sexual activity: Not Currently  Other Topics Concern   Not on file  Social History Narrative   Married, Rivka Barbara 2 step children and 2 children from previous marriage   Social Determinants of Health   Financial Resource Strain: Low Risk  (10/19/2022)   Overall Financial Resource Strain (CARDIA)    Difficulty of Paying Living Expenses: Not hard at all  Food Insecurity: No Food Insecurity (10/19/2022)   Hunger Vital Sign    Worried About Running Out of Food in the Last Year: Never true    Ran Out of Food in the Last Year: Never true  Transportation Needs: No Transportation Needs (10/19/2022)   PRAPARE - Administrator, Civil Service (Medical): No    Lack of Transportation (Non-Medical): No  Physical Activity: Sufficiently Active (10/19/2022)   Exercise Vital Sign    Days of Exercise per Week: 5 days    Minutes of Exercise per Session: 30 min  Stress: No Stress Concern Present (10/19/2022)   [employer] of Occupational Health - Occupational Stress Questionnaire    Feeling of Stress : Not at all  Social Connections: Moderately Integrated (10/19/2022)   Social Connection and Isolation Panel [NHANES]    Frequency of Communication with Friends and Family: More than three times a week    Frequency of Social Gatherings with Friends and Family: Three times a week    Attends Religious Services: More than 4 times per year    Active Member of Clubs or Organizations: No    Attends Banker Meetings: Never    Marital Status: Married    Tobacco Counseling Counseling given: Not Answered   Clinical Intake:  Pre-visit preparation completed: Yes  Pain : No/denies pain     BMI - recorded: 31.28 Nutritional Status: BMI > 30  Obese Nutritional Risks: None Diabetes: No  How often do you need to have someone help you when you read instructions, pamphlets, or other written materials from your doctor or pharmacy?: 1 -  Never  Interpreter Needed?:  No  Information entered by :: Tora Kindred, CMA   Activities of Daily Living    10/19/2022    2:59 PM  In your present state of health, do you have any difficulty performing the following activities:  Hearing? 1  Comment wears hearing aids  Vision? 0  Difficulty concentrating or making decisions? 0  Walking or climbing stairs? 0  Comment uses a cane if necessary  Dressing or bathing? 0  Doing errands, shopping? 0  Preparing Food and eating ? N  Using the Toilet? N  In the past six months, have you accidently leaked urine? N  Do you have problems with loss of bowel control? N  Managing your Medications? N  Managing your Finances? N  Housekeeping or managing your Housekeeping? N    Patient Care Team: Dale Bono, MD as PCP - General (Internal Medicine) Iran Ouch, MD as PCP - Cardiology (Cardiology) Iran Ouch, MD as Consulting Physician (Cardiology) Earna Coder, MD as Consulting Physician (Hematology and Oncology)  Indicate any recent Medical Services you may have received from other than [facility] providers in the past year (date may be approximate).     Assessment:   This is a routine wellness examination for Naol.  Hearing/Vision screen Hearing Screening - Comments:: Wears hearing aids Vision Screening - Comments:: Gets routine eye exams  Dietary issues and exercise activities discussed:     Goals Addressed               This Visit's Progress     Patient Stated (pt-stated)        Lose 10-15 lbs      Depression Screen    10/19/2022    3:09 PM 04/27/2022    8:29 AM 02/16/2022    9:44 AM 10/04/2021    1:38 PM 09/26/2021    7:10 AM 05/25/2021   11:31 AM 01/12/2021    8:52 AM  PHQ 2/9 Scores  PHQ - 2 Score 0 0 0 0 0 0 0  PHQ- 9 Score 0          Fall Risk    10/19/2022    3:13 PM 04/27/2022    8:28 AM 02/16/2022    9:44 AM 10/04/2021    1:41 PM 09/26/2021    7:10 AM  Fall Risk   Falls in the past  year? 1 1 1  0 0  Number falls in past yr: 0 0 0 0 0  Injury with Fall? 1 1 1  0 0  Risk for fall due to : History of fall(s) History of fall(s) History of fall(s);Impaired balance/gait;Impaired mobility  No Fall Risks  Follow up  Falls evaluation completed Falls evaluation completed Falls evaluation completed Falls evaluation completed    MEDICARE RISK AT HOME:  Medicare Risk at Home - 10/19/22 1514     Any stairs in or around the home? Yes    If so, are there any without handrails? No    Home free of loose throw rugs in walkways, pet beds, electrical cords, etc? Yes    Adequate lighting in your home to reduce risk of falls? Yes    Life alert? No    Use of a cane, walker or w/c? Yes   cane when necessary   Grab bars in the bathroom? Yes    Shower chair or bench in shower? No    Elevated toilet seat or a handicapped toilet? Yes  TIMED UP AND GO:  Was the test performed?  No    Cognitive Function:        10/19/2022    3:15 PM 07/16/2019    9:46 AM 07/15/2018    1:47 PM 07/11/2017    9:00 AM  6CIT Screen  What Year? 0 points 0 points 0 points 0 points  What month? 0 points 0 points 0 points 0 points  What time? 0 points 0 points 0 points 0 points  Count back from 20 0 points  0 points 0 points  Months in reverse 0 points 0 points 0 points 0 points  Repeat phrase 0 points  0 points 0 points  Total Score 0 points  0 points 0 points    Immunizations Immunization History  Administered Date(s) Administered   Fluad Quad(high Dose 65+) 11/05/2018, 11/06/2019, 11/17/2020, 11/24/2021   Influenza, High Dose Seasonal PF 11/28/2016   Influenza-Unspecified 12/05/2011, 12/05/2015, 11/04/2017   Moderna Covid-19 Vaccine Bivalent Booster 17yrs & up 12/06/2021   PFIZER Comirnaty(Gray Top)Covid-19 Tri-Sucrose Vaccine 04/28/2019, 05/26/2019   PFIZER(Purple Top)SARS-COV-2 Vaccination 03/25/2019, 04/19/2019, 12/11/2019   Pneumococcal Conjugate-13 07/12/2016   Pneumococcal  Polysaccharide-23 03/06/2006   Zoster Recombinant(Shingrix) 01/07/2019, 03/10/2019    TDAP status: Due, Education has been provided regarding the importance of this vaccine. Advised may receive this vaccine at local pharmacy or Health Dept. Aware to provide a copy of the vaccination record if obtained from local pharmacy or Health Dept. Verbalized acceptance and understanding.  Flu Vaccine status: Due, Education has been provided regarding the importance of this vaccine. Advised may receive this vaccine at local pharmacy or Health Dept. Aware to provide a copy of the vaccination record if obtained from local pharmacy or Health Dept. Verbalized acceptance and understanding.  Pneumococcal vaccine status: Up to date  Covid-19 vaccine status: Information provided on how to obtain vaccines.   Qualifies for Shingles Vaccine? Yes   Zostavax completed No   Shingrix Completed?: Yes  Screening Tests Health Maintenance  Topic Date Due   COVID-19 Vaccine (7 - 2023-24 season) 01/31/2022   INFLUENZA VACCINE  10/05/2022   Medicare Annual Wellness (AWV)  10/19/2023   Pneumonia Vaccine 40+ Years old  Completed   Zoster Vaccines- Shingrix  Completed   HPV VACCINES  Aged Out   DTaP/Tdap/Td  Discontinued    Health Maintenance  Health Maintenance Due  Topic Date Due   COVID-19 Vaccine (7 - 2023-24 season) 01/31/2022   INFLUENZA VACCINE  10/05/2022    Colorectal cancer screening: No longer required. Sees [facility] and will discuss at next OV on 01/09/23.  Lung Cancer Screening: (Low Dose CT Chest recommended if Age 7-80 years, 20 pack-year currently smoking OR have quit w/in 15years.) does not qualify.   Lung Cancer Screening Referral: n/a  Additional Screening:  Hepatitis C Screening: does not qualify  Vision Screening: Recommended annual ophthalmology exams for early detection of glaucoma and other disorders of the eye.  Dental Screening: Recommended annual dental exams for proper  oral hygiene    Community Resource Referral / Chronic Care Management: CRR required this visit?  No   CCM required this visit?  No     Plan:     I have personally reviewed and noted the following in the patient's chart:   Medical and social history Use of alcohol, tobacco or illicit drugs  Current medications and supplements including opioid prescriptions. Patient is not currently taking opioid prescriptions. Functional ability and status Nutritional status Physical activity Advanced directives List  of other physicians Hospitalizations, surgeries, and ER visits in previous 12 months Vitals Screenings to include cognitive, depression, and falls Referrals and appointments  In addition, I have reviewed and discussed with patient certain preventive protocols, quality metrics, and best practice recommendations. A written personalized care plan for preventive services as well as general preventive health recommendations were provided to patient.     Tora Kindred, CMA   10/19/2022   After Visit Summary: (MyChart) Due to this being a telephonic visit, the after visit summary with patients personalized plan was offered to patient via MyChart   Nurse Notes:  Needs Tetanus shot.

## 2022-10-19 NOTE — Patient Instructions (Signed)
Medication Instructions:  STOP the Amlodipine  *If you need a refill on your cardiac medications before your next appointment, please call your pharmacy*   Lab Work: None ordered If you have labs (blood work) drawn today and your tests are completely normal, you will receive your results only by: [place] (if you have MyChart) OR A paper copy in the mail If you have any lab test that is abnormal or we need to change your treatment, we will call you to review the results.   Testing/Procedures: None ordered   Follow-Up: At [facility], you and your health needs are our priority.  As part of our continuing mission to provide you with exceptional heart care, we have created designated Provider Care Teams.  These Care Teams include your primary Cardiologist (physician) and Advanced Practice Providers (APPs -  Physician Assistants and Nurse Practitioners) who all work together to provide you with the care you need, when you need it.  We recommend signing up for the patient portal called "MyChart".  Sign up information is provided on this After Visit Summary.  MyChart is used to connect with patients for Virtual Visits (Telemedicine).  Patients are able to view lab/test results, encounter notes, upcoming appointments, etc.  Non-urgent messages can be sent to your provider as well.   To learn more about what you can do with MyChart, go to [email or web address].    Your next appointment:   6 month(s)  Provider:   You may see Kathlyn Sacramento, MD or one of the following Advanced Practice Providers on your designated Care Team:   Murray Hodgkins, NP Christell Faith, PA-C Cadence Kathlen Mody, PA-C Gerrie Nordmann, NP

## 2022-10-19 NOTE — Patient Instructions (Addendum)
Mr. Nathaniel Thompson , Thank you for taking time to come for your Medicare Wellness Visit. I appreciate your ongoing commitment to your health goals. Please review the following plan we discussed and let me know if I can assist you in the future.   Referrals/Orders/Follow-Ups/Clinician Recommendations: Discuss with Dr. Lorin Picket regarding getting a tetanus shot.  This is a list of the screening recommended for you and due dates:  Health Maintenance  Topic Date Due   COVID-19 Vaccine (7 - 2023-24 season) 01/31/2022   Flu Shot  10/05/2022   Medicare Annual Wellness Visit  10/19/2023   Pneumonia Vaccine  Completed   Zoster (Shingles) Vaccine  Completed   HPV Vaccine  Aged Out   DTaP/Tdap/Td vaccine  Discontinued    Advanced directives: (Copy Requested) Please bring a copy of your health care power of attorney and living will to the office to be added to your chart at your convenience.  Next Medicare Annual Wellness Visit scheduled for next year: Yes, 10/25/23 @ 3pm  Preventive Care 85 Years and Older, Male  Preventive care refers to lifestyle choices and visits with your health care provider that can promote health and wellness. What does preventive care include? A yearly physical exam. This is also called an annual well check. Dental exams once or twice a year. Routine eye exams. Ask your health care provider how often you should have your eyes checked. Personal lifestyle choices, including: Daily care of your teeth and gums. Regular physical activity. Eating a healthy diet. Avoiding tobacco and drug use. Limiting alcohol use. Practicing safe sex. Taking low doses of aspirin every day. Taking vitamin and mineral supplements as recommended by your health care provider. What happens during an annual well check? The services and screenings done by your health care provider during your annual well check will depend on your age, overall health, lifestyle risk factors, and family history of  disease. Counseling  Your health care provider may ask you questions about your: Alcohol use. Tobacco use. Drug use. Emotional well-being. Home and relationship well-being. Sexual activity. Eating habits. History of falls. Memory and ability to understand (cognition). Work and work Astronomer. Screening  You may have the following tests or measurements: Height, weight, and BMI. Blood pressure. Lipid and cholesterol levels. These may be checked every 5 years, or more frequently if you are over 35 years old. Skin check. Lung cancer screening. You may have this screening every year starting at age 13 if you have a 30-pack-year history of smoking and currently smoke or have quit within the past 15 years. Fecal occult blood test (FOBT) of the stool. You may have this test every year starting at age 55. Flexible sigmoidoscopy or colonoscopy. You may have a sigmoidoscopy every 5 years or a colonoscopy every 10 years starting at age 85. Prostate cancer screening. Recommendations will vary depending on your family history and other risks. Hepatitis C blood test. Hepatitis B blood test. Sexually transmitted disease (STD) testing. Diabetes screening. This is done by checking your blood sugar (glucose) after you have not eaten for a while (fasting). You may have this done every 1-3 years. Abdominal aortic aneurysm (AAA) screening. You may need this if you are a current or former smoker. Osteoporosis. You may be screened starting at age 67 if you are at high risk. Talk with your health care provider about your test results, treatment options, and if necessary, the need for more tests. Vaccines  Your health care provider may recommend certain vaccines, such as:  Influenza vaccine. This is recommended every year. Tetanus, diphtheria, and acellular pertussis (Tdap, Td) vaccine. You may need a Td booster every 10 years. Zoster vaccine. You may need this after age 80. Pneumococcal 13-valent  conjugate (PCV13) vaccine. One dose is recommended after age 72. Pneumococcal polysaccharide (PPSV23) vaccine. One dose is recommended after age 57. Talk to your health care provider about which screenings and vaccines you need and how often you need them. This information is not intended to replace advice given to you by your health care provider. Make sure you discuss any questions you have with your health care provider. Document Released: 03/19/2015 Document Revised: 11/10/2015 Document Reviewed: 12/22/2014 Elsevier Interactive Patient Education  2017 [employer].  Fall Prevention in the Home Falls can cause injuries. They can happen to people of all ages. There are many things you can do to make your home safe and to help prevent falls. What can I do on the outside of my home? Regularly fix the edges of walkways and driveways and fix any cracks. Remove anything that might make you trip as you walk through a door, such as a raised step or threshold. Trim any bushes or trees on the path to your home. Use bright outdoor lighting. Clear any walking paths of anything that might make someone trip, such as rocks or tools. Regularly check to see if handrails are loose or broken. Make sure that both sides of any steps have handrails. Any raised decks and porches should have guardrails on the edges. Have any leaves, snow, or ice cleared regularly. Use sand or salt on walking paths during winter. Clean up any spills in your garage right away. This includes oil or grease spills. What can I do in the bathroom? Use night lights. Install grab bars by the toilet and in the tub and shower. Do not use towel bars as grab bars. Use non-skid mats or decals in the tub or shower. If you need to sit down in the shower, use a plastic, non-slip stool. Keep the floor dry. Clean up any water that spills on the floor as soon as it happens. Remove soap buildup in the tub or shower regularly. Attach bath mats  securely with double-sided non-slip rug tape. Do not have throw rugs and other things on the floor that can make you trip. What can I do in the bedroom? Use night lights. Make sure that you have a light by your bed that is easy to reach. Do not use any sheets or blankets that are too big for your bed. They should not hang down onto the floor. Have a firm chair that has side arms. You can use this for support while you get dressed. Do not have throw rugs and other things on the floor that can make you trip. What can I do in the kitchen? Clean up any spills right away. Avoid walking on wet floors. Keep items that you use a lot in easy-to-reach places. If you need to reach something above you, use a strong step stool that has a grab bar. Keep electrical cords out of the way. Do not use floor polish or wax that makes floors slippery. If you must use wax, use non-skid floor wax. Do not have throw rugs and other things on the floor that can make you trip. What can I do with my stairs? Do not leave any items on the stairs. Make sure that there are handrails on both sides of the stairs and use them.  Fix handrails that are broken or loose. Make sure that handrails are as long as the stairways. Check any carpeting to make sure that it is firmly attached to the stairs. Fix any carpet that is loose or worn. Avoid having throw rugs at the top or bottom of the stairs. If you do have throw rugs, attach them to the floor with carpet tape. Make sure that you have a light switch at the top of the stairs and the bottom of the stairs. If you do not have them, ask someone to add them for you. What else can I do to help prevent falls? Wear shoes that: Do not have high heels. Have rubber bottoms. Are comfortable and fit you well. Are closed at the toe. Do not wear sandals. If you use a stepladder: Make sure that it is fully opened. Do not climb a closed stepladder. Make sure that both sides of the stepladder  are locked into place. Ask someone to hold it for you, if possible. Clearly mark and make sure that you can see: Any grab bars or handrails. First and last steps. Where the edge of each step is. Use tools that help you move around (mobility aids) if they are needed. These include: Canes. Walkers. Scooters. Crutches. Turn on the lights when you go into a dark area. Replace any light bulbs as soon as they burn out. Set up your furniture so you have a clear path. Avoid moving your furniture around. If any of your floors are uneven, fix them. If there are any pets around you, be aware of where they are. Review your medicines with your doctor. Some medicines can make you feel dizzy. This can increase your chance of falling. Ask your doctor what other things that you can do to help prevent falls. This information is not intended to replace advice given to you by your health care provider. Make sure you discuss any questions you have with your health care provider. Document Released: 12/17/2008 Document Revised: 07/29/2015 Document Reviewed: 03/27/2014 Elsevier Interactive Patient Education  2017 [employer].

## 2022-10-19 NOTE — Telephone Encounter (Signed)
Patient dropped off PAF placed in box 

## 2022-10-20 MED ORDER — APIXABAN 5 MG PO TABS: 5.0000 mg | ORAL_TABLET | Freq: Two times a day (BID) | ORAL | 3 refills | Status: DC

## 2022-10-20 NOTE — Telephone Encounter (Signed)
Called and spoke with patients wife per release form. Reviewed patient assistance application and all information she provided. Advised that we could also use her out of pocket expense report as well. She will call her pharmacy to see if they will fax it over to our office. No further needs at this time.

## 2022-10-20 NOTE — Telephone Encounter (Signed)
Application reviewed and faxed to company.

## 2022-10-25 ENCOUNTER — Telehealth: Payer: Self-pay | Admitting: Internal Medicine

## 2022-10-25 NOTE — Telephone Encounter (Signed)
Patient need lab orders.

## 2022-10-26 NOTE — Telephone Encounter (Signed)
Please call Nathaniel Thompson ( or his wife) and notify - he is scheduled to have labs drawn through Dr Donneta Romberg.  Have him hold on coming in to our office to have labs.  Please cancel lab appt at our office. Thanks.

## 2022-10-26 NOTE — Progress Notes (Signed)
Remote pacemaker transmission.   

## 2022-10-26 NOTE — Telephone Encounter (Signed)
Sent mychart message & cancelled lab appt

## 2022-10-30 NOTE — Telephone Encounter (Signed)
Wife called to follow-up on patient's Eliquis application.

## 2022-10-30 NOTE — Telephone Encounter (Signed)
Patient wife called back, advised that they did not receive "doctors portion" of the patient assistance forms. They would just need this faxed over. Advised that nurse who assist in this was out of office, but if I was able to check, I would and get it re-faxed. Patient wife aware we will work on getting this re-faxed, she will notify us if samples are needed.  Thanks!

## 2022-10-30 NOTE — Telephone Encounter (Signed)
Called patient spoke with wife, advised that patient assistance forms were faxed, advised of number to patient assistance she will call to check on status and let us know of any update.   Patient wife verbalized understanding.

## 2022-10-31 ENCOUNTER — Other Ambulatory Visit: Payer: Medicare HMO

## 2022-11-01 NOTE — Telephone Encounter (Signed)
Pt wife calling back for an update. Informed her Dr. Kirke Corin will be back in the office tomorrow to sign. She asked for a c/b once it has been completed.

## 2022-11-02 NOTE — Telephone Encounter (Signed)
Spoke with patients spouse and reviewed that completed application has been re faxed with physician portion completed. She verbalized understanding with no further questions at this time.

## 2022-11-02 NOTE — Telephone Encounter (Signed)
Patients wife requesting an update on Eliquis patient assistance.

## 2022-11-03 ENCOUNTER — Encounter: Payer: Self-pay | Admitting: Internal Medicine

## 2022-11-03 ENCOUNTER — Ambulatory Visit: Payer: Medicare HMO | Admitting: Internal Medicine

## 2022-11-03 VITALS — BP 124/72 | HR 75 | Temp 97.5°F | Ht 70.0 in | Wt 212.4 lb

## 2022-11-03 DIAGNOSIS — I724 Aneurysm of artery of lower extremity: Secondary | ICD-10-CM

## 2022-11-03 DIAGNOSIS — I4891 Unspecified atrial fibrillation: Secondary | ICD-10-CM | POA: Diagnosis not present

## 2022-11-03 DIAGNOSIS — K2270 Barrett's esophagus without dysplasia: Secondary | ICD-10-CM

## 2022-11-03 DIAGNOSIS — D649 Anemia, unspecified: Secondary | ICD-10-CM

## 2022-11-03 DIAGNOSIS — I25119 Atherosclerotic heart disease of native coronary artery with unspecified angina pectoris: Secondary | ICD-10-CM

## 2022-11-03 DIAGNOSIS — C61 Malignant neoplasm of prostate: Secondary | ICD-10-CM

## 2022-11-03 DIAGNOSIS — N1831 Chronic kidney disease, stage 3a: Secondary | ICD-10-CM

## 2022-11-03 DIAGNOSIS — E7800 Pure hypercholesterolemia, unspecified: Secondary | ICD-10-CM

## 2022-11-03 DIAGNOSIS — J449 Chronic obstructive pulmonary disease, unspecified: Secondary | ICD-10-CM

## 2022-11-03 DIAGNOSIS — I10 Essential (primary) hypertension: Secondary | ICD-10-CM

## 2022-11-03 DIAGNOSIS — K219 Gastro-esophageal reflux disease without esophagitis: Secondary | ICD-10-CM

## 2022-11-03 DIAGNOSIS — G4730 Sleep apnea, unspecified: Secondary | ICD-10-CM

## 2022-11-03 DIAGNOSIS — I714 Abdominal aortic aneurysm, without rupture, unspecified: Secondary | ICD-10-CM | POA: Diagnosis not present

## 2022-11-03 DIAGNOSIS — I5032 Chronic diastolic (congestive) heart failure: Secondary | ICD-10-CM

## 2022-11-03 DIAGNOSIS — I6523 Occlusion and stenosis of bilateral carotid arteries: Secondary | ICD-10-CM

## 2022-11-03 DIAGNOSIS — R739 Hyperglycemia, unspecified: Secondary | ICD-10-CM

## 2022-11-03 NOTE — Assessment & Plan Note (Signed)
Dr Fletcher Anon - 01/12/22 -  Cardiac catheterization in December showed patent stents with no obstructive disease. Continue plavix.  Aspirin stopped.

## 2022-11-03 NOTE — Assessment & Plan Note (Signed)
Evaluated by [facility].  Followed by [facility].

## 2022-11-03 NOTE — Assessment & Plan Note (Signed)
Followed by hematology for IDA.  On oral iron.  Did receive venofer 08/09/22.

## 2022-11-03 NOTE — Assessment & Plan Note (Signed)
Breathing stable.

## 2022-11-03 NOTE — Assessment & Plan Note (Signed)
Follow met b and a1c.  

## 2022-11-03 NOTE — Progress Notes (Signed)
Subjective:    Patient ID: Nathaniel Thompson, male    DOB: 24-Jan-1936, 87 y.o.   MRN: [id number]  Patient here for  Chief Complaint  Patient presents with   Medical Management of Chronic Issues    HPI Here for follow up - f/u regarding afib, CAD, hypertension and hypercholesterolemia.  He is accompanied by his wife.  History obtained from both of them.  Had f/u with Dr Donneta Romberg 08/09/22 - f/u prostate cancer.  Getting intermittent eligard.  Also followed for IDA.  On oral iron.  Did receive venofer 08/09/22. Saw Dr Kirke Corin 10/19/22 - stable.  Recommended continuing eliquis and lasix 40mg  every other day. Pacemaker - doing well. Off amlodipine.  Blood pressure doing well.  Weight stable - actually down a few pounds.  Has been watching his diet.  No chest pain.  Breathing overall stable.  [facility] and [facility].  No abdominal pain.  Bowels moving - miralax every other day.    Past Medical History:  Diagnosis Date   (HFpEF) heart failure with preserved ejection fraction (HCC)    a. 05/2018 Echo: Nl EF, Gr1 DD, sev dil LA, mild MR/TR, mild PAH; b. 05/2019 Echo: EF 55-60%, no rwma, mild LVH. Nl PASP. Mildly dil LA. Triv MR. Mild AoV sclerosis w/o stenosis. Mildly dil Asc AO (39mm).   Allergic rhinitis    Barrett's esophagus 10/21/2013   Bone cancer (HCC)    CKD (chronic kidney disease), stage III (HCC)    Colon polyp, hyperplastic    COPD (chronic obstructive pulmonary disease) (HCC)    mild COPD. former smoker   Coronary artery disease    a. 2003 s/p PCI ([facility]); b. 2004 s/p PCI x 2 ([facility]); c. 11/2017 Cath: LM nl, LAD mild dzs, LCX patent stent w/ 30% distal edge restenosis, RCA patent distal stent w/ 30% prox edge stenosis. Nl EF->Med Rx;   Diverticulosis    Fundic gland polyps of stomach, benign    Gastritis    GERD (gastroesophageal reflux disease)    Hypercholesteremia    Hypertension    Prostate cancer (HCC)    Prostatism    Reflux esophagitis    Renal stones    Skin cancer     left cheek/lesion excised   Sleep apnea    Tachycardia    a. 11/2017 admit w/ tachycardia/? afib-->no afib noted upon review (amio/OAC d/c'd).   TIA (transient ischemic attack)    Past Surgical History:  Procedure Laterality Date   CARDIAC CATHETERIZATION     COLONOSCOPY WITH PROPOFOL N/A 09/13/2015   Procedure: COLONOSCOPY WITH PROPOFOL;  Surgeon: Christena Deem, MD;  Location: [facility] ENDOSCOPY;  Service: Endoscopy;  Laterality: N/A;   COLONOSCOPY WITH PROPOFOL N/A 05/07/2019   Procedure: COLONOSCOPY WITH PROPOFOL;  Surgeon: Earline Mayotte, MD;  Location: [facility] ENDOSCOPY;  Service: Endoscopy;  Laterality: N/A;   CORONARY ANGIOPLASTY     ESOPHAGOGASTRODUODENOSCOPY (EGD) WITH PROPOFOL N/A 09/13/2015   Procedure: ESOPHAGOGASTRODUODENOSCOPY (EGD) WITH PROPOFOL;  Surgeon: Christena Deem, MD;  Location[facility] ENDOSCOPY;  Service: Endoscopy;  Laterality: N/A;   ESOPHAGOGASTRODUODENOSCOPY (EGD) WITH PROPOFOL N/A 12/28/2015   Procedure: ESOPHAGOGASTRODUODENOSCOPY (EGD) WITH PROPOFOL;  Surgeon: Christena Deem, MD;  Location: [facility] ENDOSCOPY;  Service: Endoscopy;  Laterality: N/A;   ESOPHAGOGASTRODUODENOSCOPY (EGD) WITH PROPOFOL N/A 06/16/2016   Procedure: ESOPHAGOGASTRODUODENOSCOPY (EGD) WITH PROPOFOL;  Surgeon: Christena Deem, MD;  Location: [facility] ENDOSCOPY;  Service: Endoscopy;  Laterality: N/A;   ESOPHAGOGASTRODUODENOSCOPY (EGD) WITH PROPOFOL N/A 05/07/2019  Procedure: ESOPHAGOGASTRODUODENOSCOPY (EGD) WITH PROPOFOL;  Surgeon: Earline Mayotte, MD;  Location: [facility] ENDOSCOPY;  Service: Endoscopy;  Laterality: N/A;   EYE SURGERY  2013   CATARACT EXTRACTION   GANGLION CYST EXCISION Right 05/18/2017   Procedure: REMOVAL GANGLION CYST ANKLE;  Surgeon: Gwyneth Revels, DPM;  Location: [facility] ORS;  Service: Podiatry;  Laterality: Right;   heart cath stent     LEFT HEART CATH AND CORONARY ANGIOGRAPHY Right 12/03/2017   Procedure: Left Heart Cath and Coronary Angiography with possible coronary  intervention;  Surgeon: Laurier Nancy, MD;  Location: [facility] INVASIVE CV LAB;  Service: Cardiovascular;  Laterality: Right;   PACEMAKER IMPLANT N/A 04/04/2021   Procedure: PACEMAKER IMPLANT;  Surgeon: [facility] Salvia, MD;  Location: [facility] INVASIVE CV LAB;  Service: Cardiovascular;  Laterality: N/A;   PROSTATE BIOPSY     RIGHT/LEFT HEART CATH AND CORONARY ANGIOGRAPHY N/A 02/07/2021   Procedure: RIGHT/LEFT HEART CATH AND CORONARY ANGIOGRAPHY;  Surgeon: Iran Ouch, MD;  Location: [facility] INVASIVE CV LAB;  Service: Cardiovascular;  Laterality: N/A;   stents     multiple   Family History  Problem Relation Age of Onset   Cancer Mother        gastric and lung   Cancer Father        multiple myeloma   Stroke Father    Cancer Sister        leukemia   Cancer Brother        leukemia   Cancer Brother        kidney   Cancer Daughter        Uterine   Cancer Other        [place]hes (Sister's Son): Prostate   Social History   Socioeconomic History   Marital status: Married    Spouse name: Glenda   Number of children: 2   Years of education: Not on file   Highest education level: Not on file  Occupational History   Occupation: retired Production designer, theatre/television/film for [employer]  Tobacco Use   Smoking status: Former    Current packs/day: 0.00    Average packs/day: 1 pack/day for 35.0 years (35.0 ttl pk-yrs)    Types: Cigarettes    Start date: 03/07/1951    Quit date: 03/06/1986    Years since quitting: 36.6   Smokeless tobacco: Former    Types: Chew    Quit date: 1985  Vaping Use   Vaping status: Never Used  Substance and Sexual Activity   Alcohol use: No   Drug use: No   Sexual activity: Not Currently  Other Topics Concern   Not on file  Social History Narrative   Married, Rivka Barbara 2 step children and 2 children from previous marriage   Social Determinants of Health   Financial Resource Strain: Low Risk  (10/19/2022)   Overall Financial Resource Strain (CARDIA)    Difficulty of Paying Living  Expenses: Not hard at all  Food Insecurity: No Food Insecurity (10/19/2022)   Hunger Vital Sign    Worried About Running Out of Food in the Last Year: Never true    Ran Out of Food in the Last Year: Never true  Transportation Needs: No Transportation Needs (10/19/2022)   PRAPARE - Administrator, Civil Service (Medical): No    Lack of Transportation (Non-Medical): No  Physical Activity: Sufficiently Active (10/19/2022)   Exercise Vital Sign    Days of Exercise per Week: 5 days    Minutes of Exercise per  Session: 30 min  Stress: No Stress Concern Present (10/19/2022)   [employer] of Occupational Health - Occupational Stress Questionnaire    Feeling of Stress : Not at all  Social Connections: Moderately Integrated (10/19/2022)   Social Connection and Isolation Panel [NHANES]    Frequency of Communication with Friends and Family: More than three times a week    Frequency of Social Gatherings with Friends and Family: Three times a week    Attends Religious Services: More than 4 times per year    Active Member of Clubs or Organizations: No    Attends Banker Meetings: Never    Marital Status: Married     Review of Systems  Constitutional:  Negative for appetite change and unexpected weight change.  HENT:  Negative for congestion and sinus pressure.   Respiratory:  Negative for cough and chest tightness.        Breathing stable.   Cardiovascular:  Negative for chest pain, palpitations and leg swelling.  Gastrointestinal:  Negative for abdominal pain, diarrhea, nausea and vomiting.  Genitourinary:  Negative for difficulty urinating and dysuria.  Musculoskeletal:  Negative for joint swelling and myalgias.  Skin:  Negative for color change and rash.  Neurological:  Negative for dizziness and headaches.  Psychiatric/Behavioral:  Negative for agitation and dysphoric mood.        Objective:     BP 124/72   Pulse 75   Temp (!) 97.5 F (36.4 C) (Oral)   Ht  5\' 10"  (1.778 m)   Wt 212 lb 6.4 oz (96.3 kg)   SpO2 98%   BMI 30.48 kg/m  Wt Readings from Last 3 Encounters:  11/03/22 212 lb 6.4 oz (96.3 kg)  10/19/22 218 lb (98.9 kg)  10/19/22 218 lb 3.2 oz (99 kg)    Physical Exam Constitutional:      General: He is not in acute distress.    Appearance: Normal appearance. He is well-developed.  HENT:     Head: Normocephalic and atraumatic.     Right Ear: External ear normal.     Left Ear: External ear normal.  Eyes:     General: No scleral icterus.       Right eye: No discharge.        Left eye: No discharge.  Cardiovascular:     Rate and Rhythm: Normal rate and regular rhythm.  Pulmonary:     Effort: Pulmonary effort is normal. No respiratory distress.     Breath sounds: Normal breath sounds.  Abdominal:     General: Bowel sounds are normal.     Palpations: Abdomen is soft.     Tenderness: There is no abdominal tenderness.  Musculoskeletal:        General: No swelling or tenderness.     Cervical back: Neck supple. No tenderness.  Lymphadenopathy:     Cervical: No cervical adenopathy.  Skin:    Findings: No erythema or rash.  Neurological:     Mental Status: He is alert.  Psychiatric:        Mood and Affect: Mood normal.        Behavior: Behavior normal.      Outpatient Encounter Medications as of 11/03/2022  Medication Sig   apixaban (ELIQUIS) 5 MG TABS tablet Take 1 tablet (5 mg total) by mouth 2 (two) times daily.   Calcium Carb-Cholecalciferol (CALCIUM + D3 PO) Take 1 tablet by mouth daily.   cholecalciferol (VITAMIN D) 1000 units tablet Take 1,000 Units by mouth  daily.    ferrous sulfate 324 MG TBEC Take 324 mg by mouth daily.   furosemide (LASIX) 40 MG tablet Take 1 tablet (40 mg total) by mouth every other day.   isosorbide mononitrate (IMDUR) 30 MG 24 hr tablet Take 1 tablet (30 mg total) by mouth daily.   Leuprolide Acetate, 3 Month, (ELIGARD) 22.5 MG injection Inject 22.5 mg into the skin every 4 (four) months.  (Patient not taking: Reported on 10/19/2022)   losartan (COZAAR) 50 MG tablet Take 1 tablet (50 mg total) by mouth daily.   lovastatin (MEVACOR) 40 MG tablet Take 2 tablets (80 mg total) by mouth at bedtime.   metoprolol succinate (TOPROL-XL) 25 MG 24 hr tablet TAKE 1 TABLET BY MOUTH ONCE DAILY   Multiple Vitamin (MULTI-VITAMINS) TABS Take 1 tablet by mouth daily.    pantoprazole (PROTONIX) 40 MG tablet Take 40 mg by mouth 2 (two) times daily.    potassium chloride (KLOR-CON M) 10 MEQ tablet Take 1 tablet by mouth every other day with Furosemide.   No facility-administered encounter medications on file as of 11/03/2022.     Lab Results  Component Value Date   WBC 4.6 08/07/2022   HGB 11.7 (L) 08/07/2022   HCT 34.8 (L) 08/07/2022   PLT 161 08/07/2022   GLUCOSE 101 (H) 07/28/2022   CHOL 112 07/28/2022   TRIG 196.0 (H) 07/28/2022   HDL 28.50 (L) 07/28/2022   LDLCALC 45 07/28/2022   ALT 11 07/28/2022   AST 16 07/28/2022   NA 141 07/28/2022   K 4.3 07/28/2022   CL 107 07/28/2022   CREATININE 1.35 07/28/2022   BUN 25 (H) 07/28/2022   CO2 24 07/28/2022   TSH 2.15 01/25/2022   PSA 5.47 (H) 08/03/2016   HGBA1C 5.8 07/28/2022       Assessment & Plan:  Hypertension, essential Assessment & Plan: Blood pressure as outlined.  On imdur and losartan.  Continue to follow pressure.  Continue to monitor metabolic panel.     Abdominal aortic aneurysm (AAA) without rupture, unspecified part [facility]) Assessment & Plan: Saw [facility] V[facility] [place]) 01/2022 - recommended f/u aortic duplex in 12 months.    Anemia, unspecified type Assessment & Plan: Followed by hematology for IDA.  On oral iron.  Did receive venofer 08/09/22.    Atrial fibrillation, unspecified type [facility]) Assessment & Plan: Appears to be in SR today.  Follow. Continue eliquis.   Barrett's esophagus without dysplasia Assessment & Plan: Previously saw GI. Continue PPI.  Elected to hold on EGD.  Follow.    Bilateral carotid artery  stenosis Assessment & Plan: Carotid ultrasound 01/2022 - Duplex ultrasound shows <40% stenosis bilaterally. Recommended f/u one year.    Chronic heart failure with preserved ejection fraction [facility]) Assessment & Plan: Followed by cardiology.  ECHO - 05/2019 - EF 55-60%.  Continues on losartan, imdur.  Blood pressure doing well.  Overall has been doing better.  Have discussed monitoring diet - avoid increased sodium.  Weight daily - stable.   Follow.  Call with problems.    Stage 3a chronic kidney disease (HCC) Assessment & Plan: Continue to avoid antiinflammatories. Follow metabolic panel.    Chronic obstructive pulmonary disease, unspecified COPD type (HCC) Assessment & Plan: Breathing stable.    Coronary artery disease involving native coronary artery of native heart with angina pectoris [facility]) Assessment & Plan: Dr Kirke Corin - 01/12/22 -  Cardiac catheterization in December showed patent stents with no obstructive disease. Continue plavix.  Aspirin stopped.  GERD without esophagitis Assessment & Plan: Continue protonix.    Hypercholesterolemia Assessment & Plan: Continue lovastatin.  Follow lipid panel and liver function tests.     Hyperglycemia Assessment & Plan: Follow met b and a1c.    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) Assessment & Plan: Evaluated by [facility].  Followed by [facility].      Prostate cancer [facility]) Assessment & Plan: Seeing oncology.  On eligard.    Sleep apnea, unspecified type Assessment & Plan: Continue cpap.       Dale Avon, MD

## 2022-11-03 NOTE — Assessment & Plan Note (Signed)
Saw [facility] ([place]) 01/2022 - recommended f/u aortic duplex in 12 months.  

## 2022-11-03 NOTE — Assessment & Plan Note (Signed)
Continue protonix  

## 2022-11-03 NOTE — Assessment & Plan Note (Signed)
Followed by cardiology.  ECHO - 05/2019 - EF 55-60%.  Continues on losartan, imdur.  Blood pressure doing well.  Overall has been doing better.  Have discussed monitoring diet - avoid increased sodium.  Weight daily - stable.   Follow.  Call with problems.

## 2022-11-03 NOTE — Assessment & Plan Note (Signed)
Appears to be in SR today.  Follow. Continue eliquis.

## 2022-11-03 NOTE — Assessment & Plan Note (Signed)
Continue lovastatin.  Follow lipid panel and liver function tests.   

## 2022-11-03 NOTE — Assessment & Plan Note (Signed)
Blood pressure as outlined.  On imdur and losartan.  Continue to follow pressure.  Continue to monitor metabolic panel.

## 2022-11-03 NOTE — Assessment & Plan Note (Signed)
Continue cpap.  

## 2022-11-03 NOTE — Assessment & Plan Note (Signed)
Carotid ultrasound 01/2022 - Duplex ultrasound shows <40% stenosis bilaterally. Recommended f/u one year.

## 2022-11-03 NOTE — Assessment & Plan Note (Signed)
Previously saw GI. Continue PPI.  Elected to hold on EGD.  Follow.  

## 2022-11-03 NOTE — Assessment & Plan Note (Signed)
Seeing oncology.  On eligard.

## 2022-11-03 NOTE — Assessment & Plan Note (Signed)
Continue to avoid antiinflammatories.  Follow metabolic panel.  

## 2022-11-07 NOTE — Telephone Encounter (Signed)
Letter received stating that patient has been approved for Eliquis patient assistance and letter has also been sent to patient. He is eligible and will be free-of-charge from 11/03/22 to 03/06/23.

## 2022-11-13 ENCOUNTER — Inpatient Hospital Stay: Payer: Medicare HMO | Attending: Internal Medicine

## 2022-11-13 DIAGNOSIS — N183 Chronic kidney disease, stage 3 unspecified: Secondary | ICD-10-CM | POA: Diagnosis not present

## 2022-11-13 DIAGNOSIS — C61 Malignant neoplasm of prostate: Secondary | ICD-10-CM

## 2022-11-13 DIAGNOSIS — I130 Hypertensive heart and chronic kidney disease with heart failure and stage 1 through stage 4 chronic kidney disease, or unspecified chronic kidney disease: Secondary | ICD-10-CM | POA: Insufficient documentation

## 2022-11-13 DIAGNOSIS — D509 Iron deficiency anemia, unspecified: Secondary | ICD-10-CM | POA: Insufficient documentation

## 2022-11-13 LAB — CMP (CANCER CENTER ONLY)
ALT: 14 U/L (ref 0–44)
AST: 19 U/L (ref 15–41)
Albumin: 3.8 g/dL (ref 3.5–5.0)
Alkaline Phosphatase: 41 U/L (ref 38–126)
Anion gap: 5 (ref 5–15)
BUN: 29 mg/dL — ABNORMAL HIGH (ref 8–23)
CO2: 25 mmol/L (ref 22–32)
Calcium: 8.9 mg/dL (ref 8.9–10.3)
Chloride: 107 mmol/L (ref 98–111)
Creatinine: 1.35 mg/dL — ABNORMAL HIGH (ref 0.61–1.24)
GFR, Estimated: 52 mL/min — ABNORMAL LOW (ref >60)
Glucose, Bld: 110 mg/dL — ABNORMAL HIGH (ref 70–99)
Potassium: 4.1 mmol/L (ref 3.5–5.1)
Sodium: 137 mmol/L (ref 135–145)
Total Bilirubin: 0.4 mg/dL (ref 0.3–1.2)
Total Protein: 6.4 g/dL — ABNORMAL LOW (ref 6.5–8.1)

## 2022-11-13 LAB — CBC WITH DIFFERENTIAL (CANCER CENTER ONLY)
Abs Immature Granulocytes: 0.01 10*3/uL (ref 0.00–0.07)
Basophils Absolute: 0 10*3/uL (ref 0.0–0.1)
Basophils Relative: 1 %
Eosinophils Absolute: 0.2 10*3/uL (ref 0.0–0.5)
Eosinophils Relative: 4 %
HCT: 35 % — ABNORMAL LOW (ref 39.0–52.0)
Hemoglobin: 11.6 g/dL — ABNORMAL LOW (ref 13.0–17.0)
Immature Granulocytes: 0 %
Lymphocytes Relative: 25 %
Lymphs Abs: 1.1 10*3/uL (ref 0.7–4.0)
MCH: 30 pg (ref 26.0–34.0)
MCHC: 33.1 g/dL (ref 30.0–36.0)
MCV: 90.4 fL (ref 80.0–100.0)
Monocytes Absolute: 0.5 10*3/uL (ref 0.1–1.0)
Monocytes Relative: 11 %
Neutro Abs: 2.6 10*3/uL (ref 1.7–7.7)
Neutrophils Relative %: 59 %
Platelet Count: 171 10*3/uL (ref 150–400)
RBC: 3.87 MIL/uL — ABNORMAL LOW (ref 4.22–5.81)
RDW: 13.2 % (ref 11.5–15.5)
WBC Count: 4.4 10*3/uL (ref 4.0–10.5)
nRBC: 0 % (ref 0.0–0.2)

## 2022-11-13 LAB — IRON AND TIBC
Iron: 53 ug/dL (ref 45–182)
Saturation Ratios: 17 % — ABNORMAL LOW (ref 17.9–39.5)
TIBC: 308 ug/dL (ref 250–450)
UIBC: 255 ug/dL

## 2022-11-13 LAB — FERRITIN: Ferritin: 28 ng/mL (ref 24–336)

## 2022-11-13 LAB — PSA: Prostatic Specific Antigen: <0.01 ng/mL (ref 0.00–4.00)

## 2022-11-14 ENCOUNTER — Ambulatory Visit (INDEPENDENT_AMBULATORY_CARE_PROVIDER_SITE_OTHER): Payer: Medicare HMO

## 2022-11-14 ENCOUNTER — Encounter: Payer: Self-pay | Admitting: Internal Medicine

## 2022-11-14 DIAGNOSIS — Z23 Encounter for immunization: Secondary | ICD-10-CM | POA: Diagnosis not present

## 2022-11-14 NOTE — Telephone Encounter (Signed)
Yes - ok to have flu vaccine today.

## 2022-11-14 NOTE — Telephone Encounter (Signed)
His hemoglobin is stable. Dr Donneta Romberg follows. Seeing him Friday. Ok for him to have the flu shot today?

## 2022-11-14 NOTE — Telephone Encounter (Signed)
Patient is aware 

## 2022-11-16 ENCOUNTER — Other Ambulatory Visit: Payer: Self-pay

## 2022-11-16 DIAGNOSIS — D500 Iron deficiency anemia secondary to blood loss (chronic): Secondary | ICD-10-CM

## 2022-11-16 DIAGNOSIS — C61 Malignant neoplasm of prostate: Secondary | ICD-10-CM

## 2022-11-17 ENCOUNTER — Encounter: Payer: Self-pay | Admitting: Internal Medicine

## 2022-11-17 ENCOUNTER — Inpatient Hospital Stay: Payer: Medicare HMO

## 2022-11-17 ENCOUNTER — Inpatient Hospital Stay: Payer: Medicare HMO | Admitting: Internal Medicine

## 2022-11-17 VITALS — BP 106/61 | HR 77 | Temp 97.6°F | Resp 18 | Wt 216.0 lb

## 2022-11-17 VITALS — BP 121/70 | HR 82

## 2022-11-17 DIAGNOSIS — C61 Malignant neoplasm of prostate: Secondary | ICD-10-CM

## 2022-11-17 DIAGNOSIS — D509 Iron deficiency anemia, unspecified: Secondary | ICD-10-CM | POA: Diagnosis not present

## 2022-11-17 MED ORDER — SODIUM CHLORIDE 0.9 % IV SOLN
200.0000 mg | Freq: Once | INTRAVENOUS | Status: AC
  Administered 2022-11-17: 200 mg via INTRAVENOUS
  Filled 2022-11-17: qty 200

## 2022-11-17 MED ORDER — SODIUM CHLORIDE 0.9 % IV SOLN
Freq: Once | INTRAVENOUS | Status: AC
  Filled 2022-11-17: qty 250

## 2022-11-17 NOTE — Progress Notes (Signed)
[name] OFFICE PROGRESS NOTE  Patient Care Team: Dale , MD as PCP - General (Internal Medicine) Iran Ouch, MD as PCP - Cardiology (Cardiology) Iran Ouch, MD as Consulting Physician (Cardiology) Earna Coder, MD as Consulting Physician (Hematology and Oncology) Gilda Crease, Latina Craver, MD (Vascular Surgery) Louellen Molder, NP as Nurse Practitioner (Gastroenterology)   Cancer Staging  No matching staging information was found for the patient.    Oncology History Overview Note  Nov 2017- completed IM RT radiation therapy to his prostate and pelvic nodes for Gleason 7 (4+3) adenocarcinoma the prostate presenting the PSA of 7.8.  # JAN 2019- METASTATIC PROSTATE CA to Bone [low volume met on bone scan; CT- NED]; PSA ~50; March 25th 2019- Lupron 45 mg IM [Urology q 9M]; June 26, 2018-Eligard every 3 months[intolerance to Lupron/question A. Fib/injection site pain  # May 23rd Zytiga 250mg /day; no prednisone; STOPPED MARCH 17th 2021-repeated severe hypokalemia; Eligard  # Barretts- EGD/ colo Ivin Poot 2021; Dr.Byrnett].   # Charm Rings, [place]]; Oct 2019-paroxysmal A.fib/not on eliquis; [Dr.Khan] -Dr.Arida ; Dizzy spells [Dr.Shah] s/p PPM [2023]  ------------------------------------------------------------------  DIAGNOSIS: [ JAN 2019]- Met- PROSTATE CANCER  STAGE:  IV       ;GOALS: PALLIATIVE     Prostate cancer (HCC)    INTERVAL HISTORY: Patient is with his wife.  He is walking with a cane.   Nathaniel Thompson 87 y.o.  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  Pt has a pacemaker. Has dyspnea with exertion. Occ dizziness.   Continues have intermittent hemorrhoid that bleeds on the toilet tissue bright red blood.   No pain in pelvis. Voiding well. Appetite is fair. Occ hot flashes remain    Review of Systems  Constitutional:  Negative for chills, diaphoresis, fever, malaise/fatigue and weight loss.  HENT:  Negative for nosebleeds and sore throat.   Eyes:  Negative for double vision.  Respiratory:  Negative for cough, hemoptysis, sputum production, shortness of breath and wheezing.   Cardiovascular:  Negative for chest pain, palpitations, orthopnea and leg swelling.  Gastrointestinal:  Negative for abdominal pain, blood in stool, constipation, diarrhea, heartburn, melena, nausea and vomiting.  Genitourinary:  Negative for dysuria, frequency and urgency.  Musculoskeletal:  Positive for myalgias. Negative for back pain and joint pain.  Skin: Negative.  Negative for itching and rash.  Neurological:  Negative for tingling, focal weakness, weakness and headaches.  Endo/Heme/Allergies:  Does not bruise/bleed easily.  Psychiatric/Behavioral:  Negative for depression. The patient is not nervous/anxious and does not have insomnia.     PAST MEDICAL HISTORY :  Past Medical History:  Diagnosis Date   (HFpEF) heart failure with preserved ejection fraction (HCC)    a. 05/2018 Echo: Nl EF, Gr1 DD, sev dil LA, mild MR/TR, mild PAH; b. 05/2019 Echo: EF 55-60%, no rwma, mild LVH. Nl PASP. Mildly dil LA. Triv MR. Mild AoV sclerosis w/o stenosis. Mildly dil Asc AO (39mm).   Allergic rhinitis    Barrett's esophagus 10/21/2013   Bone cancer (HCC)    CKD (chronic kidney disease), stage III (HCC)    Colon polyp, hyperplastic    COPD (chronic obstructive pulmonary disease) (HCC)    mild COPD. former smoker   Coronary artery disease    a. 2003 s/p PCI ([facility]); b. 2004 s/p PCI x 2 ([facility]); c. 11/2017 Cath: LM nl, LAD  mild dzs, LCX patent stent w/ 30% distal edge restenosis, RCA patent distal stent w/ 30% prox edge stenosis. Nl EF->Med Rx;   Diverticulosis    Fundic gland polyps of stomach, benign    Gastritis    GERD (gastroesophageal reflux disease)    Hypercholesteremia    Hypertension     Prostate cancer (HCC)    Prostatism    Reflux esophagitis    Renal stones    Skin cancer    left cheek/lesion excised   Sleep apnea    Tachycardia    a. 11/2017 admit w/ tachycardia/? afib-->no afib noted upon review (amio/OAC d/c'd).   TIA (transient ischemic attack)     PAST SURGICAL HISTORY :   Past Surgical History:  Procedure Laterality Date   CARDIAC CATHETERIZATION     COLONOSCOPY WITH PROPOFOL N/A 09/13/2015   Procedure: COLONOSCOPY WITH PROPOFOL;  Surgeon: Christena Deem, MD;  Location: [facility] ENDOSCOPY;  Service: Endoscopy;  Laterality: N/A;   COLONOSCOPY WITH PROPOFOL N/A 05/07/2019   Procedure: COLONOSCOPY WITH PROPOFOL;  Surgeon: Earline Mayotte, MD;  Location: [facility] ENDOSCOPY;  Service: Endoscopy;  Laterality: N/A;   CORONARY ANGIOPLASTY     ESOPHAGOGASTRODUODENOSCOPY (EGD) WITH PROPOFOL N/A 09/13/2015   Procedure: ESOPHAGOGASTRODUODENOSCOPY (EGD) WITH PROPOFOL;  Surgeon: Christena Deem, MD;  Location: [facility] ENDOSCOPY;  Service: Endoscopy;  Laterality: N/A;   ESOPHAGOGASTRODUODENOSCOPY (EGD) WITH PROPOFOL N/A 12/28/2015   Procedure: ESOPHAGOGASTRODUODENOSCOPY (EGD) WITH PROPOFOL;  Surgeon: Christena Deem, MD;  Location: [facility] ENDOSCOPY;  Service: Endoscopy;  Laterality: N/A;   ESOPHAGOGASTRODUODENOSCOPY (EGD) WITH PROPOFOL N/A 06/16/2016   Procedure: ESOPHAGOGASTRODUODENOSCOPY (EGD) WITH PROPOFOL;  Surgeon: Christena Deem, MD;  Location: [facility] ENDOSCOPY;  Service: Endoscopy;  Laterality: N/A;   ESOPHAGOGASTRODUODENOSCOPY (EGD) WITH PROPOFOL N/A 05/07/2019   Procedure: ESOPHAGOGASTRODUODENOSCOPY (EGD) WITH PROPOFOL;  Surgeon: Earline Mayotte, MD;  Location: [facility] ENDOSCOPY;  Service: Endoscopy;  Laterality: N/A;   EYE SURGERY  2013   CATARACT EXTRACTION   GANGLION CYST EXCISION Right 05/18/2017   Procedure: REMOVAL GANGLION CYST ANKLE;  Surgeon: Gwyneth Revels, DPM;  Location: [facility] ORS;  Service: Podiatry;  Laterality: Right;   heart cath stent     LEFT HEART CATH AND  CORONARY ANGIOGRAPHY Right 12/03/2017   Procedure: Left Heart Cath and Coronary Angiography with possible coronary intervention;  Surgeon: Laurier Nancy, MD;  Location: [facility] INVASIVE CV LAB;  Service: Cardiovascular;  Laterality: Right;   PACEMAKER IMPLANT N/A 04/04/2021   Procedure: PACEMAKER IMPLANT;  Surgeon: [facility] Salvia, MD;  Location: [facility] INVASIVE CV LAB;  Service: Cardiovascular;  Laterality: N/A;   PROSTATE BIOPSY     RIGHT/LEFT HEART CATH AND CORONARY ANGIOGRAPHY N/A 02/07/2021   Procedure: RIGHT/LEFT HEART CATH AND CORONARY ANGIOGRAPHY;  Surgeon: Iran Ouch, MD;  Location: [facility] INVASIVE CV LAB;  Service: Cardiovascular;  Laterality: N/A;   stents     multiple    FAMILY HISTORY :   Family History  Problem Relation Age of Onset   Cancer Mother        gastric and lung   Cancer Father        multiple myeloma   Stroke Father    Cancer Sister        leukemia   Cancer Brother        leukemia   Cancer Brother        kidney   Cancer Daughter        Uterine   Cancer Other  [place] [employer] Son): Prostate    SOCIAL HISTORY:   Social History   Tobacco Use   Smoking status: Former    Current packs/day: 0.00    Average packs/day: 1 pack/day for 35.0 years (35.0 ttl pk-yrs)    Types: Cigarettes    Start date: 03/07/1951    Quit date: 03/06/1986    Years since quitting: 36.7   Smokeless tobacco: Former    Types: Chew    Quit date: 1985  Vaping Use   Vaping status: Never Used  Substance Use Topics   Alcohol use: No   Drug use: No    ALLERGIES:  is allergic to atorvastatin.  MEDICATIONS:  Current Outpatient Medications  Medication Sig Dispense Refill   apixaban (ELIQUIS) 5 MG TABS tablet Take 1 tablet (5 mg total) by mouth 2 (two) times daily. 180 tablet 3   Calcium Carb-Cholecalciferol (CALCIUM + D3 PO) Take 1 tablet by mouth daily.     cholecalciferol (VITAMIN D) 1000 units tablet Take 1,000 Units by mouth daily.      ferrous sulfate 324 MG TBEC Take  324 mg by mouth daily.     furosemide (LASIX) 40 MG tablet Take 1 tablet (40 mg total) by mouth every other day. 45 tablet 3   isosorbide mononitrate (IMDUR) 30 MG 24 hr tablet Take 1 tablet (30 mg total) by mouth daily. 90 tablet 2   losartan (COZAAR) 50 MG tablet Take 1 tablet (50 mg total) by mouth daily. 90 tablet 1   lovastatin (MEVACOR) 40 MG tablet Take 2 tablets (80 mg total) by mouth at bedtime. 180 tablet 1   metoprolol succinate (TOPROL-XL) 25 MG 24 hr tablet TAKE 1 TABLET BY MOUTH ONCE DAILY 90 tablet 1   Multiple Vitamin (MULTI-VITAMINS) TABS Take 1 tablet by mouth daily.      pantoprazole (PROTONIX) 40 MG tablet Take 40 mg by mouth 2 (two) times daily.      potassium chloride (KLOR-CON M) 10 MEQ tablet Take 1 tablet by mouth every other day with Furosemide. 45 tablet 3   Leuprolide Acetate, 3 Month, (ELIGARD) 22.5 MG injection Inject 22.5 mg into the skin every 4 (four) months. (Patient not taking: Reported on 10/19/2022)     No current facility-administered medications for this visit.    PHYSICAL EXAMINATION: ECOG PERFORMANCE STATUS: 0 - Asymptomatic  BP 106/61 (BP Location: Left Arm, Patient Position: Sitting)   Pulse 77   Temp 97.6 F (36.4 C) (Tympanic)   Resp 18   Wt 216 lb (98 kg)   SpO2 96%   BMI 30.99 kg/m   Filed Weights   11/17/22 1247  Weight: 216 lb (98 kg)   Patient in wheelchair; left leg in the brace  Physical Exam HENT:     Head: Normocephalic and atraumatic.     Mouth/Throat:     Pharynx: No oropharyngeal exudate.  Eyes:     Pupils: Pupils are equal, round, and reactive to light.  Cardiovascular:     Rate and Rhythm: Normal rate and regular rhythm.  Pulmonary:     Effort: No respiratory distress.     Breath sounds: No wheezing.  Abdominal:     General: Bowel sounds are normal. There is no distension.     Palpations: Abdomen is soft. There is no mass.     Tenderness: There is no abdominal tenderness. There is no guarding or rebound.   Musculoskeletal:        General: No tenderness. Normal range of motion.  Cervical back: Normal range of motion and neck supple.  Skin:    General: Skin is warm.  Neurological:     Mental Status: He is alert and oriented to person, place, and time.  Psychiatric:        Mood and Affect: Affect normal.     LABORATORY DATA:  I have reviewed the data as listed    Component Value Date/Time   NA 137 11/13/2022 0803   NA 141 02/04/2021 0810   NA 140 09/16/2013 0438   K 4.1 11/13/2022 0803   K 4.2 09/16/2013 0438   CL 107 11/13/2022 0803   CL 109 (H) 09/16/2013 0438   CO2 25 11/13/2022 0803   CO2 24 09/16/2013 0438   GLUCOSE 110 (H) 11/13/2022 0803   GLUCOSE 101 (H) 09/16/2013 0438   BUN 29 (H) 11/13/2022 0803   BUN 18 02/04/2021 0810   BUN 20 (H) 09/16/2013 0438   CREATININE 1.35 (H) 11/13/2022 0803   CREATININE 1.28 09/16/2013 0438   CALCIUM 8.9 11/13/2022 0803   CALCIUM 8.3 (L) 09/16/2013 0438   PROT 6.4 (L) 11/13/2022 0803   PROT 6.7 09/15/2013 0755   ALBUMIN 3.8 11/13/2022 0803   ALBUMIN 3.5 09/15/2013 0755   AST 19 11/13/2022 0803   ALT 14 11/13/2022 0803   ALT 28 09/15/2013 0755   ALKPHOS 41 11/13/2022 0803   ALKPHOS 44 (L) 09/15/2013 0755   BILITOT 0.4 11/13/2022 0803   GFRNONAA 52 (L) 11/13/2022 0803   GFRNONAA 54 (L) 09/16/2013 0438   GFRAA 59 (L) 10/07/2019 1305   GFRAA >60 09/16/2013 0438    No results found for: "SPEP", "UPEP"  Lab Results  Component Value Date   WBC 4.4 11/13/2022   NEUTROABS 2.6 11/13/2022   HGB 11.6 (L) 11/13/2022   HCT 35.0 (L) 11/13/2022   MCV 90.4 11/13/2022   PLT 171 11/13/2022      Chemistry      Component Value Date/Time   NA 137 11/13/2022 0803   NA 141 02/04/2021 0810   NA 140 09/16/2013 0438   K 4.1 11/13/2022 0803   K 4.2 09/16/2013 0438   CL 107 11/13/2022 0803   CL 109 (H) 09/16/2013 0438   CO2 25 11/13/2022 0803   CO2 24 09/16/2013 0438   BUN 29 (H) 11/13/2022 0803   BUN 18 02/04/2021 0810   BUN 20  (H) 09/16/2013 0438   CREATININE 1.35 (H) 11/13/2022 0803   CREATININE 1.28 09/16/2013 0438      Component Value Date/Time   CALCIUM 8.9 11/13/2022 0803   CALCIUM 8.3 (L) 09/16/2013 0438   ALKPHOS 41 11/13/2022 0803   ALKPHOS 44 (L) 09/15/2013 0755   AST 19 11/13/2022 0803   ALT 14 11/13/2022 0803   ALT 28 09/15/2013 0755   BILITOT 0.4 11/13/2022 0803       RADIOGRAPHIC STUDIES: I have personally reviewed the radiological images as listed and agreed with the findings in the report. No results found.   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 Stage IV-on Eligard; Bone scan October 2020-improved;  SEP 2023 PSA- <0.1.  Currently getting intermittent Eligard. SEP 2024- PSA <0.01.  Hold Eligard today.  If significantly elevated would recommend Eligard. If PSA rising would consider bone scan.   # Iron deficient anemia/CKD- III-[s/p EGD March 2021]--status post IV iron infusion x1 [FEB 2022]-SEP 2024-hemoglobin is 11.6 - iron sat 17%; Overall stable.  Continue oral iron t proceed with  venofer today-  stable.   # Nov 26th, 2023-acute PE -lobar and segmental PE [[facility]]/ A.fib - on Eliquis [no asprin/plavix]-provoked left foot surgery. However, pt on indefinite Eliquis 5 mg twice daily. Stable.   #CKD stage III-GFR 50s.  Clinically stable.  Eligard 22.5 q4 m- ? nov 2023.  # DISPOSITION:  # HOLD eligard; # Venofer today # venofer in 2 weeks-  # Follow up in 3 months-  MD; 2-3 days prior- labs- cbc.cmp/iron studies; ferritin; PSA eligard; possible venofer- Dr.B  Cc; Dr.Scott/  Orders Placed This Encounter  Procedures   PSA    Standing Status:   Future    Standing Expiration Date:   11/17/2023   All questions were answered. The patient knows to call the clinic with any problems, questions or concerns.      Earna Coder, MD 11/17/2022 1:28 PM

## 2022-11-17 NOTE — Progress Notes (Signed)
Pt has a pacemaker. Has dyspnea with exertion. Occ dizziness. Has a hemorrhoid that bleeds on the toilet tissue bright red blood. No pain in pelvis. Voiding well. Appetite is fair. Loves anything sweet.  Occ hot flashes remain.

## 2022-11-17 NOTE — Assessment & Plan Note (Signed)
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  Currently getting intermittent Eligard. SEP 2024- PSA <0.01.  Hold Eligard today.  If significantly elevated would recommend Eligard. If PSA rising would consider bone scan.   # Iron deficient anemia/CKD- III-[s/p EGD March 2021]--status post IV iron infusion x1 [FEB 2022]-SEP 2024-hemoglobin is 11.6 - iron sat 17%; Overall stable.  Continue oral iron t proceed with venofer today-  stable.   # Nov 26th, 2023-acute PE -lobar and segmental PE [[facility]]/ A.fib - on Eliquis [no asprin/plavix]-provoked left foot surgery. However, pt on indefinite Eliquis 5 mg twice daily. Stable.   #CKD stage III-GFR 50s.  Clinically stable.  Eligard 22.5 q4 m- ? nov 2023.  # DISPOSITION:  # HOLD eligard; # Venofer today # venofer in 2 weeks-  # Follow up in 3 months-  MD; 2-3 days prior- labs- cbc.cmp/iron studies; ferritin; PSA eligard; possible venofer- Dr.B  Cc; Dr.Scott/

## 2022-11-17 NOTE — Patient Instructions (Signed)
Iron Sucrose Injection What is this medication? IRON SUCROSE (EYE ern SOO krose) treats low levels of iron (iron deficiency anemia) in people with kidney disease. Iron is a mineral that plays an important role in making red blood cells, which carry oxygen from your lungs to the rest of your body. This medicine may be used for other purposes; ask your health care provider or pharmacist if you have questions. COMMON BRAND NAME(S): Venofer What should I tell my care team before I take this medication? They need to know if you have any of these conditions: Anemia not caused by low iron levels Heart disease High levels of iron in the blood Kidney disease Liver disease An unusual or allergic reaction to iron, other medications, foods, dyes, or preservatives Pregnant or trying to get pregnant Breastfeeding How should I use this medication? This medication is for infusion into a vein. It is given in a hospital or clinic setting. Talk to your care team about the use of this medication in children. While this medication may be prescribed for children as young as 2 years for selected conditions, precautions do apply. Overdosage: If you think you have taken too much of this medicine contact a poison control center or emergency room at once. NOTE: This medicine is only for you. Do not share this medicine with others. What if I miss a dose? Keep appointments for follow-up doses. It is important not to miss your dose. Call your care team if you are unable to keep an appointment. What may interact with this medication? Do not take this medication with any of the following: Deferoxamine Dimercaprol Other iron products This medication may also interact with the following: Chloramphenicol Deferasirox This list may not describe all possible interactions. Give your health care provider a list of all the medicines, herbs, non-prescription drugs, or dietary supplements you use. Also tell them if you smoke,  drink alcohol, or use illegal drugs. Some items may interact with your medicine. What should I watch for while using this medication? Visit your care team regularly. Tell your care team if your symptoms do not start to get better or if they get worse. You may need blood work done while you are taking this medication. You may need to follow a special diet. Talk to your care team. Foods that contain iron include: whole grains/cereals, dried fruits, beans, or peas, leafy green vegetables, and organ meats (liver, kidney). What side effects may I notice from receiving this medication? Side effects that you should report to your care team as soon as possible: Allergic reactions--skin rash, itching, hives, swelling of the face, lips, tongue, or throat Low blood pressure--dizziness, feeling faint or lightheaded, blurry vision Shortness of breath Side effects that usually do not require medical attention (report to your care team if they continue or are bothersome): Flushing Headache Joint pain Muscle pain Nausea Pain, redness, or irritation at injection site This list may not describe all possible side effects. Call your doctor for medical advice about side effects. You may report side effects to FDA at 1-800-FDA-1088. Where should I keep my medication? This medication is given in a hospital or clinic. It will not be stored at home. NOTE: This sheet is a summary. It may not cover all possible information. If you have questions about this medicine, talk to your doctor, pharmacist, or health care provider.  2024 Elsevier/Gold Standard (2022-07-28 00:00:00)

## 2022-11-28 ENCOUNTER — Other Ambulatory Visit: Payer: Self-pay | Admitting: Cardiovascular Disease

## 2022-12-01 ENCOUNTER — Inpatient Hospital Stay: Payer: Medicare HMO

## 2022-12-01 VITALS — BP 125/67 | HR 80 | Temp 96.8°F | Resp 18

## 2022-12-01 DIAGNOSIS — C61 Malignant neoplasm of prostate: Secondary | ICD-10-CM

## 2022-12-01 DIAGNOSIS — D509 Iron deficiency anemia, unspecified: Secondary | ICD-10-CM | POA: Diagnosis not present

## 2022-12-01 MED ORDER — SODIUM CHLORIDE 0.9 % IV SOLN
Freq: Once | INTRAVENOUS | Status: AC
  Filled 2022-12-01: qty 250

## 2022-12-01 MED ORDER — SODIUM CHLORIDE 0.9 % IV SOLN
200.0000 mg | Freq: Once | INTRAVENOUS | Status: AC
  Administered 2022-12-01: 200 mg via INTRAVENOUS
  Filled 2022-12-01: qty 200

## 2022-12-01 NOTE — Patient Instructions (Signed)
Iron Sucrose Injection What is this medication? IRON SUCROSE (EYE ern SOO krose) treats low levels of iron (iron deficiency anemia) in people with kidney disease. Iron is a mineral that plays an important role in making red blood cells, which carry oxygen from your lungs to the rest of your body. This medicine may be used for other purposes; ask your health care provider or pharmacist if you have questions. COMMON BRAND NAME(S): Venofer What should I tell my care team before I take this medication? They need to know if you have any of these conditions: Anemia not caused by low iron levels Heart disease High levels of iron in the blood Kidney disease Liver disease An unusual or allergic reaction to iron, other medications, foods, dyes, or preservatives Pregnant or trying to get pregnant Breastfeeding How should I use this medication? This medication is for infusion into a vein. It is given in a hospital or clinic setting. Talk to your care team about the use of this medication in children. While this medication may be prescribed for children as young as 2 years for selected conditions, precautions do apply. Overdosage: If you think you have taken too much of this medicine contact a poison control center or emergency room at once. NOTE: This medicine is only for you. Do not share this medicine with others. What if I miss a dose? Keep appointments for follow-up doses. It is important not to miss your dose. Call your care team if you are unable to keep an appointment. What may interact with this medication? Do not take this medication with any of the following: Deferoxamine Dimercaprol Other iron products This medication may also interact with the following: Chloramphenicol Deferasirox This list may not describe all possible interactions. Give your health care provider a list of all the medicines, herbs, non-prescription drugs, or dietary supplements you use. Also tell them if you smoke,  drink alcohol, or use illegal drugs. Some items may interact with your medicine. What should I watch for while using this medication? Visit your care team regularly. Tell your care team if your symptoms do not start to get better or if they get worse. You may need blood work done while you are taking this medication. You may need to follow a special diet. Talk to your care team. Foods that contain iron include: whole grains/cereals, dried fruits, beans, or peas, leafy green vegetables, and organ meats (liver, kidney). What side effects may I notice from receiving this medication? Side effects that you should report to your care team as soon as possible: Allergic reactions--skin rash, itching, hives, swelling of the face, lips, tongue, or throat Low blood pressure--dizziness, feeling faint or lightheaded, blurry vision Shortness of breath Side effects that usually do not require medical attention (report to your care team if they continue or are bothersome): Flushing Headache Joint pain Muscle pain Nausea Pain, redness, or irritation at injection site This list may not describe all possible side effects. Call your doctor for medical advice about side effects. You may report side effects to FDA at 1-800-FDA-1088. Where should I keep my medication? This medication is given in a hospital or clinic. It will not be stored at home. NOTE: This sheet is a summary. It may not cover all possible information. If you have questions about this medicine, talk to your doctor, pharmacist, or health care provider.  2024 Elsevier/Gold Standard (2022-07-28 00:00:00)

## 2023-01-09 ENCOUNTER — Ambulatory Visit (INDEPENDENT_AMBULATORY_CARE_PROVIDER_SITE_OTHER): Payer: Medicare HMO

## 2023-01-09 DIAGNOSIS — I472 Ventricular tachycardia, unspecified: Secondary | ICD-10-CM

## 2023-01-09 DIAGNOSIS — I480 Paroxysmal atrial fibrillation: Secondary | ICD-10-CM | POA: Diagnosis not present

## 2023-01-09 LAB — CUP PACEART REMOTE DEVICE CHECK
Battery Remaining Longevity: 142 mo
Battery Voltage: 3.03 V
Brady Statistic AP VP Percent: 0.12 %
Brady Statistic AP VS Percent: 94.37 %
Brady Statistic AS VP Percent: 0.02 %
Brady Statistic AS VS Percent: 5.49 %
Brady Statistic RA Percent Paced: 94.74 %
Brady Statistic RV Percent Paced: 0.2 %
Date Time Interrogation Session: 20241105030133
Implantable Lead Connection Status: 753985
Implantable Lead Connection Status: 753985
Implantable Lead Implant Date: 20230130
Implantable Lead Implant Date: 20230130
Implantable Lead Location: 753859
Implantable Lead Location: 753860
Implantable Lead Model: 3830
Implantable Lead Model: 5076
Implantable Pulse Generator Implant Date: 20230130
Lead Channel Impedance Value: 342 Ohm
Lead Channel Impedance Value: 437 Ohm
Lead Channel Impedance Value: 494 Ohm
Lead Channel Impedance Value: 608 Ohm
Lead Channel Pacing Threshold Amplitude: 0.75 V
Lead Channel Pacing Threshold Amplitude: 0.875 V
Lead Channel Pacing Threshold Pulse Width: 0.4 ms
Lead Channel Pacing Threshold Pulse Width: 0.4 ms
Lead Channel Sensing Intrinsic Amplitude: 2.25 mV
Lead Channel Sensing Intrinsic Amplitude: 2.25 mV
Lead Channel Sensing Intrinsic Amplitude: 3.25 mV
Lead Channel Sensing Intrinsic Amplitude: 3.25 mV
Lead Channel Setting Pacing Amplitude: 1.75 V
Lead Channel Setting Pacing Amplitude: 2 V
Lead Channel Setting Pacing Pulse Width: 0.4 ms
Lead Channel Setting Sensing Sensitivity: 0.9 mV
Zone Setting Status: 755011

## 2023-01-18 ENCOUNTER — Other Ambulatory Visit (INDEPENDENT_AMBULATORY_CARE_PROVIDER_SITE_OTHER): Payer: Self-pay | Admitting: Nurse Practitioner

## 2023-01-18 DIAGNOSIS — I714 Abdominal aortic aneurysm, without rupture, unspecified: Secondary | ICD-10-CM

## 2023-01-18 DIAGNOSIS — I6523 Occlusion and stenosis of bilateral carotid arteries: Secondary | ICD-10-CM

## 2023-01-22 ENCOUNTER — Ambulatory Visit (INDEPENDENT_AMBULATORY_CARE_PROVIDER_SITE_OTHER): Payer: Medicare HMO

## 2023-01-22 ENCOUNTER — Ambulatory Visit (INDEPENDENT_AMBULATORY_CARE_PROVIDER_SITE_OTHER): Payer: Medicare HMO | Admitting: Vascular Surgery

## 2023-01-22 ENCOUNTER — Encounter (INDEPENDENT_AMBULATORY_CARE_PROVIDER_SITE_OTHER): Payer: Self-pay | Admitting: Vascular Surgery

## 2023-01-22 VITALS — BP 156/94 | HR 84 | Resp 16 | Wt 214.0 lb

## 2023-01-22 DIAGNOSIS — I714 Abdominal aortic aneurysm, without rupture, unspecified: Secondary | ICD-10-CM | POA: Diagnosis not present

## 2023-01-22 DIAGNOSIS — I6523 Occlusion and stenosis of bilateral carotid arteries: Secondary | ICD-10-CM

## 2023-01-22 DIAGNOSIS — I724 Aneurysm of artery of lower extremity: Secondary | ICD-10-CM | POA: Diagnosis not present

## 2023-01-22 DIAGNOSIS — I10 Essential (primary) hypertension: Secondary | ICD-10-CM

## 2023-01-22 DIAGNOSIS — I25119 Atherosclerotic heart disease of native coronary artery with unspecified angina pectoris: Secondary | ICD-10-CM

## 2023-01-22 NOTE — Progress Notes (Signed)
MRN : [id number]  Nathaniel Thompson is a 87 y.o. (May 19, 1935) male who presents with chief complaint of check circulation.  History of Present Illness:   The patient returns to the office for follow-up regarding an abdominal aortic aneurysm as well as atherosclerotic occlusive disease bilaterally.  The aneurysm was found incidentally by plain films for low back pain. Patient denies abdominal pain or unusual back pain, no other abdominal complaints.  He does have a bony met from his prostate cancer at L3.  No history of an acute onset of painful blue discoloration of the toes.      No family history of AAA.    Patient denies amaurosis fugax or TIA symptoms. There is no history of claudication or rest pain symptoms of the lower extremities.  He does have pain in the right leg when he walks but it radiates from posterior to laterally down the leg and occurs with laying down in bed tooThe patient denies angina or shortness of breath.   Duplex ultrasound of the abdominal aorta obtained today demonstrates the infrarenal abdominal aorta is 2.8 cm.  The previous aortic duplex ultrasound dated 01/23/2022 demonstrated a infrarenal abdominal aortic aneurysm with a maximal diameter of 2.8 cm   Carotid duplex obtained today demonstrates 1 to 39% bilaterally internal carotid artery stenosis.  There is no significant change on today's study compared to previous.  Previous duplex ultrasound dated 08/2022 of the bilateral popliteal arteries demonstrate both the right and left popliteal arteries have a maximal transverse dimension of 1.05 cm  Current Meds  Medication Sig   apixaban (ELIQUIS) 5 MG TABS tablet Take 1 tablet (5 mg total) by mouth 2 (two) times daily.   Calcium Carb-Cholecalciferol (CALCIUM + D3 PO) Take 1 tablet by mouth daily.   cholecalciferol (VITAMIN D) 1000 units tablet Take 1,000 Units by mouth daily.    ferrous sulfate  324 MG TBEC Take 324 mg by mouth daily.   furosemide (LASIX) 40 MG tablet Take 1 tablet (40 mg total) by mouth every other day.   isosorbide mononitrate (IMDUR) 30 MG 24 hr tablet Take 1 tablet (30 mg total) by mouth daily.   losartan (COZAAR) 50 MG tablet Take 1 tablet (50 mg total) by mouth daily.   lovastatin (MEVACOR) 40 MG tablet Take 2 tablets (80 mg total) by mouth at bedtime.   metoprolol succinate (TOPROL-XL) 25 MG 24 hr tablet TAKE 1 TABLET BY MOUTH ONCE DAILY   Multiple Vitamin (MULTI-VITAMINS) TABS Take 1 tablet by mouth daily.    pantoprazole (PROTONIX) 40 MG tablet Take 40 mg by mouth 2 (two) times daily.    potassium chloride (KLOR-CON M) 10 MEQ tablet Take 1 tablet by mouth every other day with Furosemide.    Past Medical History:  Diagnosis Date   (HFpEF) heart failure with preserved ejection fraction (HCC)    a. 05/2018 Echo: Nl EF, Gr1 DD, sev dil LA, mild MR/TR, mild PAH; b. 05/2019 Echo: EF 55-60%, no rwma, mild LVH. Nl PASP. Mildly dil LA. Triv MR. Mild AoV sclerosis w/o stenosis. Mildly dil Asc AO (39mm).  Allergic rhinitis    Barrett's esophagus 10/21/2013   Bone cancer (HCC)    CKD (chronic kidney disease), stage III (HCC)    Colon polyp, hyperplastic    COPD (chronic obstructive pulmonary disease) (HCC)    mild COPD. former smoker   Coronary artery disease    a. 2003 s/p PCI ([facility]); b. 2004 s/p PCI x 2 ([facility]); c. 11/2017 Cath: LM nl, LAD mild dzs, LCX patent stent w/ 30% distal edge restenosis, RCA patent distal stent w/ 30% prox edge stenosis. Nl EF->Med Rx;   Diverticulosis    Fundic gland polyps of stomach, benign    Gastritis    GERD (gastroesophageal reflux disease)    Hypercholesteremia    Hypertension    Prostate cancer (HCC)    Prostatism    Reflux esophagitis    Renal stones    Skin cancer    left cheek/lesion excised   Sleep apnea    Tachycardia    a. 11/2017 admit w/ tachycardia/? afib-->no afib noted upon review (amio/OAC d/c'd).   TIA  (transient ischemic attack)     Past Surgical History:  Procedure Laterality Date   CARDIAC CATHETERIZATION     COLONOSCOPY WITH PROPOFOL N/A 09/13/2015   Procedure: COLONOSCOPY WITH PROPOFOL;  Surgeon: Christena Deem, MD;  Location: [facility] ENDOSCOPY;  Service: Endoscopy;  Laterality: N/A;   COLONOSCOPY WITH PROPOFOL N/A 05/07/2019   Procedure: COLONOSCOPY WITH PROPOFOL;  Surgeon: Earline Mayotte, MD;  Location: [facility] ENDOSCOPY;  Service: Endoscopy;  Laterality: N/A;   CORONARY ANGIOPLASTY     ESOPHAGOGASTRODUODENOSCOPY (EGD) WITH PROPOFOL N/A 09/13/2015   Procedure: ESOPHAGOGASTRODUODENOSCOPY (EGD) WITH PROPOFOL;  Surgeon: Christena Deem, MD;  Location: [facility] ENDOSCOPY;  Service: Endoscopy;  Laterality: N/A;   ESOPHAGOGASTRODUODENOSCOPY (EGD) WITH PROPOFOL N/A 12/28/2015   Procedure: ESOPHAGOGASTRODUODENOSCOPY (EGD) WITH PROPOFOL;  Surgeon: Christena Deem, MD;  Location: [facility] ENDOSCOPY;  Service: Endoscopy;  Laterality: N/A;   ESOPHAGOGASTRODUODENOSCOPY (EGD) WITH PROPOFOL N/A 06/16/2016   Procedure: ESOPHAGOGASTRODUODENOSCOPY (EGD) WITH PROPOFOL;  Surgeon: Christena Deem, MD;  Location: [facility] ENDOSCOPY;  Service: Endoscopy;  Laterality: N/A;   ESOPHAGOGASTRODUODENOSCOPY (EGD) WITH PROPOFOL N/A 05/07/2019   Procedure: ESOPHAGOGASTRODUODENOSCOPY (EGD) WITH PROPOFOL;  Surgeon: Earline Mayotte, MD;  Location: [facility] ENDOSCOPY;  Service: Endoscopy;  Laterality: N/A;   EYE SURGERY  2013   CATARACT EXTRACTION   GANGLION CYST EXCISION Right 05/18/2017   Procedure: REMOVAL GANGLION CYST ANKLE;  Surgeon: Gwyneth Revels, DPM;  Location: [facility] ORS;  Service: Podiatry;  Laterality: Right;   heart cath stent     LEFT HEART CATH AND CORONARY ANGIOGRAPHY Right 12/03/2017   Procedure: Left Heart Cath and Coronary Angiography with possible coronary intervention;  Surgeon: Laurier Nancy, MD;  Location: [facility] INVASIVE CV LAB;  Service: Cardiovascular;  Laterality: Right;   PACEMAKER IMPLANT N/A 04/04/2021    Procedure: PACEMAKER IMPLANT;  Surgeon: [facility] Salvia, MD;  Location: [facility] INVASIVE CV LAB;  Service: Cardiovascular;  Laterality: N/A;   PROSTATE BIOPSY     RIGHT/LEFT HEART CATH AND CORONARY ANGIOGRAPHY N/A 02/07/2021   Procedure: RIGHT/LEFT HEART CATH AND CORONARY ANGIOGRAPHY;  Surgeon: Iran Ouch, MD;  Location: [facility] INVASIVE CV LAB;  Service: Cardiovascular;  Laterality: N/A;   stents     multiple    Social History Social History   Tobacco Use   Smoking status: Former    Current packs/day: 0.00    Average packs/day: 1 pack/day for 35.0 years (35.0 ttl pk-yrs)    Types: Cigarettes  Start date: 03/07/1951    Quit date: 03/06/1986    Years since quitting: 36.9   Smokeless tobacco: Former    Types: Chew    Quit date: 1985  Vaping Use   Vaping status: Never Used  Substance Use Topics   Alcohol use: No   Drug use: No    Family History Family History  Problem Relation Age of Onset   Cancer Mother        gastric and lung   Cancer Father        multiple myeloma   Stroke Father    Cancer Sister        leukemia   Cancer Brother        leukemia   Cancer Brother        kidney   Cancer Daughter        Uterine   Cancer Other        Nephes (Sister's Son): Prostate    Allergies  Allergen Reactions   Atorvastatin Other (See Comments)    Achy joints     REVIEW OF SYSTEMS (Negative unless checked)  Constitutional: [] Weight loss  [] Fever  [] Chills Cardiac: [] Chest pain   [] Chest pressure   [] Palpitations   [] Shortness of breath when laying flat   [] Shortness of breath with exertion. Vascular:  [x] Pain in legs with walking   [] Pain in legs at rest  [] History of DVT   [] Phlebitis   [] Swelling in legs   [] Varicose veins   [] Non-healing ulcers Pulmonary:   [] Uses home oxygen   [] Productive cough   [] Hemoptysis   [] Wheeze  [] COPD   [] Asthma Neurologic:  [] Dizziness   [] Seizures   [] History of stroke   [] History of TIA  [] Aphasia   [] Vissual changes   [] Weakness or  numbness in arm   [] Weakness or numbness in leg Musculoskeletal:   [] Joint swelling   [] Joint pain   [] Low back pain Hematologic:  [] Easy bruising  [] Easy bleeding   [] Hypercoagulable state   [] Anemic Gastrointestinal:  [] Diarrhea   [] Vomiting  [] Gastroesophageal reflux/heartburn   [] Difficulty swallowing. Genitourinary:  [] Chronic kidney disease   [] Difficult urination  [] Frequent urination   [] Blood in urine Skin:  [] Rashes   [] Ulcers  Psychological:  [] History of anxiety   []  History of major depression.  Physical Examination  Vitals:   01/22/23 0837  BP: (!) 156/94  Pulse: 84  Resp: 16  Weight: 214 lb (97.1 kg)   Body mass index is 30.71 kg/m. Gen: WD/WN, NAD Head: [place]/AT, No temporalis wasting.  Ear/Nose/Throat: Hearing grossly intact, nares w/o erythema or drainage Eyes: PER, EOMI, sclera nonicteric.  Neck: Supple, no masses.  No bruit or JVD.  Pulmonary:  Good air movement, no audible wheezing, no use of accessory muscles.  Cardiac: RRR, normal S1, S2, no Murmurs. Vascular: Large bilateral popliteal artery and pulses consistent with aneurysmal changes, no open wounds Vessel Right Left  Radial Palpable Palpable  PT Palpable Palpable  DP Palpable Palpable  Gastrointestinal: soft, non-distended. No guarding/no peritoneal signs.  Musculoskeletal: M/S 5/5 throughout.  No visible deformity.  Neurologic: CN 2-12 intact. Pain and light touch intact in extremities.  Symmetrical.  Speech is fluent. Motor exam as listed above. Psychiatric: Judgment intact, Mood & affect appropriate for pt's clinical situation. Dermatologic: No rashes or ulcers noted.  No changes consistent with cellulitis.   CBC Lab Results  Component Value Date   WBC 4.4 11/13/2022   HGB 11.6 (L) 11/13/2022   HCT 35.0 (L) 11/13/2022  MCV 90.4 11/13/2022   PLT 171 11/13/2022    BMET    Component Value Date/Time   NA 137 11/13/2022 0803   NA 141 02/04/2021 0810   NA 140 09/16/2013 0438   K 4.1  11/13/2022 0803   K 4.2 09/16/2013 0438   CL 107 11/13/2022 0803   CL 109 (H) 09/16/2013 0438   CO2 25 11/13/2022 0803   CO2 24 09/16/2013 0438   GLUCOSE 110 (H) 11/13/2022 0803   GLUCOSE 101 (H) 09/16/2013 0438   BUN 29 (H) 11/13/2022 0803   BUN 18 02/04/2021 0810   BUN 20 (H) 09/16/2013 0438   CREATININE 1.35 (H) 11/13/2022 0803   CREATININE 1.28 09/16/2013 0438   CALCIUM 8.9 11/13/2022 0803   CALCIUM 8.3 (L) 09/16/2013 0438   GFRNONAA 52 (L) 11/13/2022 0803   GFRNONAA 54 (L) 09/16/2013 0438   GFRAA 59 (L) 10/07/2019 1305   GFRAA >60 09/16/2013 0438   CrCl cannot be calculated (Patient's most recent lab result is older than the maximum 21 days allowed.).  COAG No results found for: "INR", "PROTIME"  Radiology CUP PACEART REMOTE DEVICE CHECK  Result Date: 01/09/2023 Scheduled remote reviewed. Normal device function.  2 NSVT, 5 & 8 beats in duration Hx of PAF, longest duration 5hrs , not always good rate control, burden 0.5%, Eliquis per PA report Next remote 91 days. LA, CVRS    Assessment/Plan 1. Abdominal aortic aneurysm (AAA) without rupture, unspecified part (HCC) Recommend: No surgery or intervention is indicated at this time.  The patient has an asymptomatic abdominal aortic aneurysm that is less than 4 cm in maximal diameter.    I have reviewed the natural history of abdominal aortic aneurysm and the small risk of rupture for aneurysm less than 5 cm in size.  However, as these small aneurysms tend to enlarge over time, continued surveillance with ultrasound or CT scan is mandatory.   I have also discussed optimizing medical management with hypertension and lipid control and the negative effect that any tobacco products have on aneurysmal disease.  The patient is also encouraged to exercise a minimum of 30 minutes 4 times a week.   Should the patient develop new onset abdominal or back pain or signs of peripheral embolization they are instructed to seek medical  attention immediately and to alert the physician providing care that they have an aneurysm.   The patient voices their understanding.  The patient will return in 12 months with an aortic duplex. - VAS US AORTA/IVC/ILIACS; Future  2. Bilateral carotid artery stenosis Recommend:  Given the patient's asymptomatic subcritical stenosis no further invasive testing or surgery at this time.  Duplex ultrasound shows 1-39% ICA stenosis bilaterally.  Continue antiplatelet therapy as prescribed Continue management of CAD, HTN and Hyperlipidemia Healthy heart diet,  encouraged exercise at least 4 times per week  Follow up in 24-36 months with duplex ultrasound and physical exam   3. Popliteal artery aneurysm (HCC) Recommend: No surgery or intervention is indicated at this time.  The patient has an asymptomatic popliteal artery aneurysm that is less than 2.5 cm in maximal diameter.  I have discussed the natural history of popliteal aneurysm and the small risk of thrombosis for aneurysm less than 2.5 cm in size.  However, as these small aneurysms tend to enlarge over time, continued surveillance with ultrasound is mandatory.   I have also discussed optimizing medical management with hypertension and lipid control and the negative effect that any tobacco products have on  aneurysmal disease.  The patient is also encouraged to exercise a minimum of 30 minutes 4 times a week.   Should the patient develop new leg pain or signs of peripheral embolization they are instructed to seek medical attention immediately and to alert the physician providing care that they have an aneurysm behind the knee.   The patient voices their understanding.  The patient will follow up as scheduled with duplex ultrasound of the lower extremities for surveillance. - VAS [place] LOWER EXTREMITY ARTERIAL DUPLEX; Future  4. Hypertension, essential Continue antihypertensive medications as already ordered, these medications have been  reviewed and there are no changes at this time.  5. Coronary artery disease involving native coronary artery of native heart with angina pectoris (HCC) Continue cardiac and antihypertensive medications as already ordered and reviewed, no changes at this time.  Continue statin as ordered and reviewed, no changes at this time  Nitrates PRN for chest pain    Levora Dredge, MD  01/22/2023 8:47 AM

## 2023-01-23 ENCOUNTER — Telehealth: Payer: Self-pay | Admitting: Cardiovascular Disease

## 2023-01-23 NOTE — Telephone Encounter (Signed)
Wife is calling about getting patient assistant for Eliquis. Please advise

## 2023-01-25 NOTE — Telephone Encounter (Signed)
Spoke with [facility] and [employer]. Patient should be getting medications today in the mail. No further needs.

## 2023-01-25 NOTE — Telephone Encounter (Signed)
Spoke with patients wife and updated her that they should receive shipment today with his medication. She was so thankful and had no further needs.

## 2023-01-25 NOTE — Telephone Encounter (Signed)
Spoke with patients wife per release form. We discussed patients eligibility with his Eliquis. She received letter previously stating that he would qualify until 03/06/23. She called company and she reports that someone confused her and she was now not sure. Advised that I would call to follow up on that. I also provided her with number for assistance with [employer] help. She was very Adult nurse. Discussed with her that I would call her back once I speak with company. She verbalized understanding of our conversation with no further questions at this time.

## 2023-02-06 NOTE — Progress Notes (Signed)
Remote pacemaker transmission.   

## 2023-02-14 ENCOUNTER — Telehealth: Payer: Self-pay

## 2023-02-14 NOTE — Transitions of Care (Post Inpatient/ED Visit) (Signed)
02/14/2023  Name: Nathaniel Thompson MRN: [id number] DOB: 11/02/1935  Today's TOC FU Call Status: Today's TOC FU Call Status:: Successful TOC FU Call Completed TOC FU Call Complete Date: 02/14/23 Patient's Name and Date of Birth confirmed.  Transition Care Management Follow-up Telephone Call Date of Discharge: 02/13/23 Discharge Facility: Other [facility]) Name of Other (Non[facility]) Discharge Facility: [facility] Type of Discharge: Inpatient Admission Primary Inpatient Discharge Diagnosis:: weakness How have you been since you were released from the hospital?: Better Any questions or concerns?: No  Items Reviewed: Did you receive and understand the discharge instructions provided?: Yes Medications obtained,verified, and reconciled?: Yes (Medications Reviewed) Any new allergies since your discharge?: No Dietary orders reviewed?: Yes Do you have support at home?: Yes People in Home: spouse  Medications Reviewed Today: Medications Reviewed Today     Reviewed by Karena Addison, LPN (Licensed Practical Nurse) on 02/14/23 at [phone]  Med List Status: <None>   Medication Order Taking? Sig Documenting Provider Last Dose Status Informant  apixaban (ELIQUIS) 5 MG TABS tablet [id number] No Take 1 tablet (5 mg total) by mouth 2 (two) times daily. Iran Ouch, MD Taking Active   Calcium Carb-Cholecalciferol (CALCIUM + D3 PO) [id number] No Take 1 tablet by mouth daily. [provider] Taking Active Self  cholecalciferol (VITAMIN D) 1000 units tablet [id number] No Take 1,000 Units by mouth daily.  [provider] Taking Active Self  ferrous sulfate 324 MG TBEC [id number] No Take 324 mg by mouth daily. [provider] Taking Active Self  furosemide (LASIX) 40 MG tablet [id number] No Take 1 tablet (40 mg total) by mouth every other day. [facility] Salvia, MD Taking Active   isosorbide mononitrate (IMDUR) 30 MG 24 hr tablet [id number] No Take 1 tablet (30 mg total) by  mouth daily. Dale Spencer, MD Taking Active   Leuprolide Acetate, 3 Month, (ELIGARD) 22.5 MG injection [id number] No Inject 22.5 mg into the skin every 4 (four) months.  Patient not taking: Reported on 10/19/2022   [provider] Not Taking Active Self  losartan (COZAAR) 50 MG tablet [id number] No Take 1 tablet (50 mg total) by mouth daily. Iran Ouch, MD Taking Active   lovastatin (MEVACOR) 40 MG tablet [id number] No Take 2 tablets (80 mg total) by mouth at bedtime. Dale [place], MD Taking Active   metoprolol succinate (TOPROL-XL) 25 MG 24 hr tablet [id number] No TAKE 1 TABLET BY MOUTH ONCE DAILY Iran Ouch, MD Taking Active   Multiple Vitamin (MULTI-VITAMINS) TABS [id number] No Take 1 tablet by mouth daily.  [provider] Taking Active Self           Med Note Tiburcio Pea, Luvenia Redden   Fri May 11, 2017 10:33 AM)    pantoprazole (PROTONIX) 40 MG tablet [id number] No Take 40 mg by mouth 2 (two) times daily.  [provider] Taking Active Self  potassium chloride (KLOR-CON M) 10 MEQ tablet [id number] No Take 1 tablet by mouth every other day with Furosemide. [facility] Salvia, MD Taking Active             Home Care and Equipment/Supplies: Were Home Health Services Ordered?: NA Any new equipment or medical supplies ordered?: NA  Functional Questionnaire: Do you need assistance with bathing/showering or dressing?: No Do you need assistance with meal preparation?: No Do you need assistance with eating?: No Do you have difficulty maintaining continence: No Do you need assistance with getting out of bed/getting out of a chair/moving?: No  Do you have difficulty managing or taking your medications?: No  Follow up appointments reviewed: PCP Follow-up appointment confirmed?: Yes Date of PCP follow-up appointment?: 02/16/23 Follow-up Provider: [facility] Follow-up appointment confirmed?: Yes Date of Specialist follow-up appointment?:  05/28/23 Follow-Up Specialty Provider:: neuro Do you need transportation to your follow-up appointment?: No Do you understand care options if your condition(s) worsen?: Yes-patient verbalized understanding    SIGNATURE Karena Addison, LPN [facility] Nurse Health Advisor Direct Dial [phone]

## 2023-02-16 ENCOUNTER — Emergency Department
Admission: EM | Admit: 2023-02-16 | Discharge: 2023-02-16 | Discharge status: Against Medical Advice | Payer: Medicare HMO | Attending: Emergency Medicine | Admitting: Emergency Medicine

## 2023-02-16 ENCOUNTER — Inpatient Hospital Stay: Payer: Medicare HMO | Attending: Internal Medicine

## 2023-02-16 ENCOUNTER — Ambulatory Visit: Payer: Medicare HMO | Admitting: Internal Medicine

## 2023-02-16 ENCOUNTER — Other Ambulatory Visit: Payer: Self-pay

## 2023-02-16 ENCOUNTER — Emergency Department: Payer: Medicare HMO

## 2023-02-16 VITALS — BP 110/66 | HR 81 | Temp 98.2°F | Ht 70.0 in | Wt 214.8 lb

## 2023-02-16 DIAGNOSIS — Z87891 Personal history of nicotine dependence: Secondary | ICD-10-CM | POA: Insufficient documentation

## 2023-02-16 DIAGNOSIS — Z8673 Personal history of transient ischemic attack (TIA), and cerebral infarction without residual deficits: Secondary | ICD-10-CM | POA: Diagnosis not present

## 2023-02-16 DIAGNOSIS — C61 Malignant neoplasm of prostate: Secondary | ICD-10-CM | POA: Insufficient documentation

## 2023-02-16 DIAGNOSIS — C7951 Secondary malignant neoplasm of bone: Secondary | ICD-10-CM | POA: Insufficient documentation

## 2023-02-16 DIAGNOSIS — D509 Iron deficiency anemia, unspecified: Secondary | ICD-10-CM | POA: Diagnosis present

## 2023-02-16 DIAGNOSIS — I10 Essential (primary) hypertension: Secondary | ICD-10-CM

## 2023-02-16 DIAGNOSIS — Z5321 Procedure and treatment not carried out due to patient leaving prior to being seen by health care provider: Secondary | ICD-10-CM | POA: Insufficient documentation

## 2023-02-16 DIAGNOSIS — I493 Ventricular premature depolarization: Secondary | ICD-10-CM | POA: Insufficient documentation

## 2023-02-16 DIAGNOSIS — Z7901 Long term (current) use of anticoagulants: Secondary | ICD-10-CM | POA: Insufficient documentation

## 2023-02-16 DIAGNOSIS — D500 Iron deficiency anemia secondary to blood loss (chronic): Secondary | ICD-10-CM

## 2023-02-16 DIAGNOSIS — K2270 Barrett's esophagus without dysplasia: Secondary | ICD-10-CM

## 2023-02-16 DIAGNOSIS — I714 Abdominal aortic aneurysm, without rupture, unspecified: Secondary | ICD-10-CM | POA: Diagnosis not present

## 2023-02-16 DIAGNOSIS — R531 Weakness: Secondary | ICD-10-CM | POA: Diagnosis present

## 2023-02-16 DIAGNOSIS — E7800 Pure hypercholesterolemia, unspecified: Secondary | ICD-10-CM

## 2023-02-16 DIAGNOSIS — R202 Paresthesia of skin: Secondary | ICD-10-CM

## 2023-02-16 DIAGNOSIS — K219 Gastro-esophageal reflux disease without esophagitis: Secondary | ICD-10-CM

## 2023-02-16 DIAGNOSIS — R739 Hyperglycemia, unspecified: Secondary | ICD-10-CM

## 2023-02-16 DIAGNOSIS — J449 Chronic obstructive pulmonary disease, unspecified: Secondary | ICD-10-CM

## 2023-02-16 DIAGNOSIS — D649 Anemia, unspecified: Secondary | ICD-10-CM | POA: Diagnosis not present

## 2023-02-16 DIAGNOSIS — I4891 Unspecified atrial fibrillation: Secondary | ICD-10-CM

## 2023-02-16 DIAGNOSIS — C775 Secondary and unspecified malignant neoplasm of intrapelvic lymph nodes: Secondary | ICD-10-CM | POA: Insufficient documentation

## 2023-02-16 DIAGNOSIS — I25119 Atherosclerotic heart disease of native coronary artery with unspecified angina pectoris: Secondary | ICD-10-CM

## 2023-02-16 DIAGNOSIS — I6523 Occlusion and stenosis of bilateral carotid arteries: Secondary | ICD-10-CM

## 2023-02-16 DIAGNOSIS — N1831 Chronic kidney disease, stage 3a: Secondary | ICD-10-CM

## 2023-02-16 DIAGNOSIS — I5032 Chronic diastolic (congestive) heart failure: Secondary | ICD-10-CM

## 2023-02-16 LAB — IRON AND TIBC
Iron: 76 ug/dL (ref 45–182)
Saturation Ratios: 21 % (ref 17.9–39.5)
TIBC: 367 ug/dL (ref 250–450)
UIBC: 291 ug/dL

## 2023-02-16 LAB — CMP (CANCER CENTER ONLY)
ALT: 15 U/L (ref 0–44)
AST: 18 U/L (ref 15–41)
Albumin: 3.9 g/dL (ref 3.5–5.0)
Alkaline Phosphatase: 43 U/L (ref 38–126)
Anion gap: 7 (ref 5–15)
BUN: 24 mg/dL — ABNORMAL HIGH (ref 8–23)
CO2: 26 mmol/L (ref 22–32)
Calcium: 9.1 mg/dL (ref 8.9–10.3)
Chloride: 107 mmol/L (ref 98–111)
Creatinine: 1.3 mg/dL — ABNORMAL HIGH (ref 0.61–1.24)
GFR, Estimated: 54 mL/min — ABNORMAL LOW (ref >60)
Glucose, Bld: 99 mg/dL (ref 70–99)
Potassium: 4.2 mmol/L (ref 3.5–5.1)
Sodium: 140 mmol/L (ref 135–145)
Total Bilirubin: 0.6 mg/dL (ref <1.2)
Total Protein: 6.5 g/dL (ref 6.5–8.1)

## 2023-02-16 LAB — CBC WITH DIFFERENTIAL (CANCER CENTER ONLY)
Abs Immature Granulocytes: 0.01 10*3/uL (ref 0.00–0.07)
Basophils Absolute: 0 10*3/uL (ref 0.0–0.1)
Basophils Relative: 1 %
Eosinophils Absolute: 0.2 10*3/uL (ref 0.0–0.5)
Eosinophils Relative: 4 %
HCT: 38.3 % — ABNORMAL LOW (ref 39.0–52.0)
Hemoglobin: 12.9 g/dL — ABNORMAL LOW (ref 13.0–17.0)
Immature Granulocytes: 0 %
Lymphocytes Relative: 26 %
Lymphs Abs: 1.4 10*3/uL (ref 0.7–4.0)
MCH: 30.9 pg (ref 26.0–34.0)
MCHC: 33.7 g/dL (ref 30.0–36.0)
MCV: 91.8 fL (ref 80.0–100.0)
Monocytes Absolute: 0.5 10*3/uL (ref 0.1–1.0)
Monocytes Relative: 9 %
Neutro Abs: 3.3 10*3/uL (ref 1.7–7.7)
Neutrophils Relative %: 60 %
Platelet Count: 175 10*3/uL (ref 150–400)
RBC: 4.17 MIL/uL — ABNORMAL LOW (ref 4.22–5.81)
RDW: 13.2 % (ref 11.5–15.5)
WBC Count: 5.5 10*3/uL (ref 4.0–10.5)
nRBC: 0 % (ref 0.0–0.2)

## 2023-02-16 LAB — PROTIME-INR
INR: 1.2 (ref 0.8–1.2)
Prothrombin Time: 15.4 s — ABNORMAL HIGH (ref 11.4–15.2)

## 2023-02-16 LAB — DIFFERENTIAL
Abs Immature Granulocytes: 0.01 10*3/uL (ref 0.00–0.07)
Basophils Absolute: 0 10*3/uL (ref 0.0–0.1)
Basophils Relative: 0 %
Eosinophils Absolute: 0.2 10*3/uL (ref 0.0–0.5)
Eosinophils Relative: 4 %
Immature Granulocytes: 0 %
Lymphocytes Relative: 27 %
Lymphs Abs: 1.4 10*3/uL (ref 0.7–4.0)
Monocytes Absolute: 0.5 10*3/uL (ref 0.1–1.0)
Monocytes Relative: 9 %
Neutro Abs: 3.1 10*3/uL (ref 1.7–7.7)
Neutrophils Relative %: 60 %

## 2023-02-16 LAB — ETHANOL: Alcohol, Ethyl (B): <10 mg/dL (ref <10)

## 2023-02-16 LAB — COMPREHENSIVE METABOLIC PANEL
ALT: 17 U/L (ref 0–44)
AST: 19 U/L (ref 15–41)
Albumin: 3.9 g/dL (ref 3.5–5.0)
Alkaline Phosphatase: 45 U/L (ref 38–126)
Anion gap: 8 (ref 5–15)
BUN: 23 mg/dL (ref 8–23)
CO2: 26 mmol/L (ref 22–32)
Calcium: 9.2 mg/dL (ref 8.9–10.3)
Chloride: 105 mmol/L (ref 98–111)
Creatinine, Ser: 1.33 mg/dL — ABNORMAL HIGH (ref 0.61–1.24)
GFR, Estimated: 52 mL/min — ABNORMAL LOW (ref >60)
Glucose, Bld: 104 mg/dL — ABNORMAL HIGH (ref 70–99)
Potassium: 4.2 mmol/L (ref 3.5–5.1)
Sodium: 139 mmol/L (ref 135–145)
Total Bilirubin: 0.5 mg/dL (ref <1.2)
Total Protein: 6.7 g/dL (ref 6.5–8.1)

## 2023-02-16 LAB — CBC
HCT: 38.2 % — ABNORMAL LOW (ref 39.0–52.0)
Hemoglobin: 12.7 g/dL — ABNORMAL LOW (ref 13.0–17.0)
MCH: 30.2 pg (ref 26.0–34.0)
MCHC: 33.2 g/dL (ref 30.0–36.0)
MCV: 91 fL (ref 80.0–100.0)
Platelets: 184 10*3/uL (ref 150–400)
RBC: 4.2 MIL/uL — ABNORMAL LOW (ref 4.22–5.81)
RDW: 13.2 % (ref 11.5–15.5)
WBC: 5.2 10*3/uL (ref 4.0–10.5)
nRBC: 0 % (ref 0.0–0.2)

## 2023-02-16 LAB — APTT: aPTT: 39 s — ABNORMAL HIGH (ref 24–36)

## 2023-02-16 LAB — PSA: Prostatic Specific Antigen: 0.01 ng/mL (ref 0.00–4.00)

## 2023-02-16 LAB — FERRITIN: Ferritin: 36 ng/mL (ref 24–336)

## 2023-02-16 NOTE — ED Triage Notes (Signed)
Pt to ED reports generalized weakness to whole body earlier and intermittent tingling to right arm. States tingling is intermittent. Was seen at [facility] last week for same. Denies weakness at this time. Equal grip and strength. Clear speech  No code stroke per [facility]

## 2023-02-16 NOTE — Progress Notes (Unsigned)
Subjective:    Patient ID: Nathaniel Thompson, male    DOB: [birth year], 87 y.o.   MRN: [id number]  Patient here for  Chief Complaint  Patient presents with   Hospitalization Follow-up    HPI Here for a hospital follow up. Admitted 02/10/23 - 02/13/23 - for acute stroke. Day of presentation the patient woke up at baseline and drove to [place]. Around 11:30 AM he started to feel light headed and generally weak. This progressed to slurred speech with right sided hemiparesis and paraesthesias. CT head negative for acute bleed. CTA head left ICA stenosis no LVO. CTA neck/dopplers no significant stenosis.TTE negative for LV thrombus, LVEF >55%. MRI brain without acute infarct, old left parietal microhemorrhage and right frontal infarct. Etiology of event uncertain, acute onset most concerning for stroke, despite negative imaging may represent MRI-negative stroke given risk factors and prior infarcts, timeline of symptoms could indicate TIA . Patient with improving exam throughout admission to near baseline with mild R hemibody subjective sensory loss. PT/OT determined appropriate for outpatient therapy. Discharged on home anticoagulation with Eliquis 5mg  BID and home statin. AAA [place] 01/22/23: unchanged from prior measuring 2.8 cm in diameter. Will continue to monitor for symptoms, otherwise will recommend continued outpatient surveillance. Follow up for repeat CT chest in 3 months for incidental finding of right basilar subpleural nodule measuring up to 1.0 cm and 8 mm right lower lobe pulmonary nodule. Symptoms completely resolved while in the hospital. States he was feeling better until today. He is unsure of time, but noticed around lunch time today, he started feeling weak. On presentation today, feeling weaker. States feels like "weak as dishwater". Also reported noticing right arm symptoms returning. Describes as more of a different sensation -  numbness/tingling now. Similar to feeling he had prior to  hospitalization.  No slurring of speech.    Past Medical History:  Diagnosis Date   (HFpEF) heart failure with preserved ejection fraction (HCC)    a. 05/2018 Echo: Nl EF, Gr1 DD, sev dil LA, mild MR/TR, mild PAH; b. 05/2019 Echo: EF 55-60%, no rwma, mild LVH. Nl PASP. Mildly dil LA. Triv MR. Mild AoV sclerosis w/o stenosis. Mildly dil Asc AO (39mm).   Allergic rhinitis    Barrett's esophagus 10/21/2013   Bone cancer (HCC)    CKD (chronic kidney disease), stage III (HCC)    Colon polyp, hyperplastic    COPD (chronic obstructive pulmonary disease) (HCC)    mild COPD. former smoker   Coronary artery disease    a. 2003 s/p PCI ([facility]); b. 2004 s/p PCI x 2 ([facility]); c. 11/2017 Cath: LM nl, LAD mild dzs, LCX patent stent w/ 30% distal edge restenosis, RCA patent distal stent w/ 30% prox edge stenosis. Nl EF->Med Rx;   Diverticulosis    Fundic gland polyps of stomach, benign    Gastritis    GERD (gastroesophageal reflux disease)    Hypercholesteremia    Hypertension    Prostate cancer (HCC)    Prostatism    Reflux esophagitis    Renal stones    Skin cancer    left cheek/lesion excised   Sleep apnea    Tachycardia    a. 11/2017 admit w/ tachycardia/? afib-->no afib noted upon review (amio/OAC d/c'd).   TIA (transient ischemic attack)    Past Surgical History:  Procedure Laterality Date   CARDIAC CATHETERIZATION     COLONOSCOPY WITH PROPOFOL N/A 09/13/2015   Procedure: COLONOSCOPY WITH PROPOFOL;  Surgeon: Christena Deem, MD;  Location: [facility] ENDOSCOPY;  Service: Endoscopy;  Laterality: N/A;   COLONOSCOPY WITH PROPOFOL N/A 05/07/2019   Procedure: COLONOSCOPY WITH PROPOFOL;  Surgeon: Earline Mayotte, MD;  Location: [facility] ENDOSCOPY;  Service: Endoscopy;  Laterality: N/A;   CORONARY ANGIOPLASTY     ESOPHAGOGASTRODUODENOSCOPY (EGD) WITH PROPOFOL N/A 09/13/2015   Procedure: ESOPHAGOGASTRODUODENOSCOPY (EGD) WITH PROPOFOL;  Surgeon: Christena Deem, MD;  Location[facility] ENDOSCOPY;  Service:  Endoscopy;  Laterality: N/A;   ESOPHAGOGASTRODUODENOSCOPY (EGD) WITH PROPOFOL N/A 12/28/2015   Procedure: ESOPHAGOGASTRODUODENOSCOPY (EGD) WITH PROPOFOL;  Surgeon: Christena Deem, MD;  Location: [facility] ENDOSCOPY;  Service: Endoscopy;  Laterality: N/A;   ESOPHAGOGASTRODUODENOSCOPY (EGD) WITH PROPOFOL N/A 06/16/2016   Procedure: ESOPHAGOGASTRODUODENOSCOPY (EGD) WITH PROPOFOL;  Surgeon: Christena Deem, MD;  Location: [facility] ENDOSCOPY;  Service: Endoscopy;  Laterality: N/A;   ESOPHAGOGASTRODUODENOSCOPY (EGD) WITH PROPOFOL N/A 05/07/2019   Procedure: ESOPHAGOGASTRODUODENOSCOPY (EGD) WITH PROPOFOL;  Surgeon: Earline Mayotte, MD;  Location: [facility] ENDOSCOPY;  Service: Endoscopy;  Laterality: N/A;   EYE SURGERY  2013   CATARACT EXTRACTION   GANGLION CYST EXCISION Right 05/18/2017   Procedure: REMOVAL GANGLION CYST ANKLE;  Surgeon: Gwyneth Revels, DPM;  Location: [facility] ORS;  Service: Podiatry;  Laterality: Right;   heart cath stent     LEFT HEART CATH AND CORONARY ANGIOGRAPHY Right 12/03/2017   Procedure: Left Heart Cath and Coronary Angiography with possible coronary intervention;  Surgeon: Laurier Nancy, MD;  Location: [facility] INVASIVE CV LAB;  Service: Cardiovascular;  Laterality: Right;   PACEMAKER IMPLANT N/A 04/04/2021   Procedure: PACEMAKER IMPLANT;  Surgeon: [facility] Salvia, MD;  Location: [facility] INVASIVE CV LAB;  Service: Cardiovascular;  Laterality: N/A;   PROSTATE BIOPSY     RIGHT/LEFT HEART CATH AND CORONARY ANGIOGRAPHY N/A 02/07/2021   Procedure: RIGHT/LEFT HEART CATH AND CORONARY ANGIOGRAPHY;  Surgeon: Iran Ouch, MD;  Location: [facility] INVASIVE CV LAB;  Service: Cardiovascular;  Laterality: N/A;   stents     multiple   Family History  Problem Relation Age of Onset   Cancer Mother        gastric and lung   Cancer Father        multiple myeloma   Stroke Father    Cancer Sister        leukemia   Cancer Brother        leukemia   Cancer Brother        kidney   Cancer Daughter         Uterine   Cancer Other        [place] (Sister's Son): Prostate   Social History   Socioeconomic History   Marital status: Married    Spouse name: Glenda   Number of children: 2   Years of education: Not on file   Highest education level: Not on file  Occupational History   Occupation: retired Production designer, theatre/television/film for [employer]  Tobacco Use   Smoking status: Former    Current packs/day: 0.00    Average packs/day: 1 pack/day for 35.0 years (35.0 ttl pk-yrs)    Types: Cigarettes    Start date: 03/07/1951    Quit date: 03/06/1986    Years since quitting: 36.9   Smokeless tobacco: Former    Types: Chew    Quit date: 1985  Vaping Use   Vaping status: Never Used  Substance and Sexual Activity   Alcohol use: No   Drug use: No   Sexual activity: Not Currently  Other Topics Concern   Not on file  Social History Narrative   Married, Glenda 2 step children and 2 children from previous marriage   Social Drivers of Corporate investment banker Strain: Low Risk  (10/19/2022)   Overall Financial Resource Strain (CARDIA)    Difficulty of Paying Living Expenses: Not hard at all  Food Insecurity: No Food Insecurity (10/19/2022)   Hunger Vital Sign    Worried About Running Out of Food in the Last Year: Never true    Ran Out of Food in the Last Year: Never true  Transportation Needs: No Transportation Needs (10/19/2022)   PRAPARE - Administrator, Civil Service (Medical): No    Lack of Transportation (Non-Medical): No  Physical Activity: Sufficiently Active (10/19/2022)   Exercise Vital Sign    Days of Exercise per Week: 5 days    Minutes of Exercise per Session: 30 min  Stress: No Stress Concern Present (10/19/2022)   [employer] of Occupational Health - Occupational Stress Questionnaire    Feeling of Stress : Not at all  Social Connections: Moderately Integrated (10/19/2022)   Social Connection and Isolation Panel [NHANES]    Frequency of Communication with Friends and Family:  More than three times a week    Frequency of Social Gatherings with Friends and Family: Three times a week    Attends Religious Services: More than 4 times per year    Active Member of Clubs or Organizations: No    Attends Banker Meetings: Never    Marital Status: Married     Review of Systems     Objective:     BP 110/66   Pulse 81   Temp 98.2 F (36.8 C)   Ht 5\' 10"  (1.778 m)   Wt 214 lb 12.8 oz (97.4 kg)   SpO2 99%   BMI 30.82 kg/m  Wt Readings from Last 3 Encounters:  02/16/23 214 lb 12.8 oz (97.4 kg)  01/22/23 214 lb (97.1 kg)  11/17/22 216 lb (98 kg)    Physical Exam   Outpatient Encounter Medications as of 02/16/2023  Medication Sig   apixaban (ELIQUIS) 5 MG TABS tablet Take 1 tablet (5 mg total) by mouth 2 (two) times daily.   Calcium Carb-Cholecalciferol (CALCIUM + D3 PO) Take 1 tablet by mouth daily.   cholecalciferol (VITAMIN D) 1000 units tablet Take 1,000 Units by mouth daily.    ferrous sulfate 324 MG TBEC Take 324 mg by mouth daily.   furosemide (LASIX) 40 MG tablet Take 1 tablet (40 mg total) by mouth every other day.   isosorbide mononitrate (IMDUR) 30 MG 24 hr tablet Take 1 tablet (30 mg total) by mouth daily.   losartan (COZAAR) 50 MG tablet Take 1 tablet (50 mg total) by mouth daily.   lovastatin (MEVACOR) 40 MG tablet Take 2 tablets (80 mg total) by mouth at bedtime.   metoprolol succinate (TOPROL-XL) 25 MG 24 hr tablet TAKE 1 TABLET BY MOUTH ONCE DAILY   Multiple Vitamin (MULTI-VITAMINS) TABS Take 1 tablet by mouth daily.    pantoprazole (PROTONIX) 40 MG tablet Take 40 mg by mouth 2 (two) times daily.    potassium chloride (KLOR-CON M) 10 MEQ tablet Take 1 tablet by mouth every other day with Furosemide.   Leuprolide Acetate, 3 Month, (ELIGARD) 22.5 MG injection Inject 22.5 mg into the skin every 4 (four) months. (Patient not taking: Reported on 10/19/2022)   No facility-administered encounter medications on file as of 02/16/2023.      Lab Results  Component Value Date   WBC 5.5 02/16/2023   HGB 12.9 (L) 02/16/2023   HCT 38.3 (L) 02/16/2023   PLT 175 02/16/2023   GLUCOSE 99 02/16/2023   CHOL 112 07/28/2022   TRIG 196.0 (H) 07/28/2022   HDL 28.50 (L) 07/28/2022   LDLCALC 45 07/28/2022   ALT 15 02/16/2023   AST 18 02/16/2023   NA 140 02/16/2023   K 4.2 02/16/2023   CL 107 02/16/2023   CREATININE 1.30 (H) 02/16/2023   BUN 24 (H) 02/16/2023   CO2 26 02/16/2023   TSH 2.15 01/25/2022   PSA 5.47 (H) 08/03/2016   HGBA1C 5.8 07/28/2022    No results found.     Assessment & Plan:  Weakness -     EKG 12-Lead     [place], MD

## 2023-02-16 NOTE — ED Notes (Signed)
Pts wife reports pt is feeling better and no longer wishes to wait. Pt and wife advised to stay but if not able to, to return if sx's return and f/u with PCP.

## 2023-02-17 ENCOUNTER — Encounter: Payer: Self-pay | Admitting: Internal Medicine

## 2023-02-17 DIAGNOSIS — R202 Paresthesia of skin: Secondary | ICD-10-CM | POA: Insufficient documentation

## 2023-02-17 NOTE — Assessment & Plan Note (Signed)
Follow met b and a1c.  

## 2023-02-17 NOTE — Assessment & Plan Note (Signed)
Given report of indigestion - EKG - SR with no acute ischemic changes, non specific ST/T changes.  Continues on PPI as outlined.  ER evaluation.

## 2023-02-17 NOTE — Assessment & Plan Note (Signed)
Breathing stable.

## 2023-02-17 NOTE — Assessment & Plan Note (Signed)
Previously saw GI. Continue PPI.  Elected to hold on EGD.  Follow.  

## 2023-02-17 NOTE — Assessment & Plan Note (Signed)
Blood pressure as outlined.  On imdur and losartan.  Continue to follow pressure.  Continue to monitor metabolic panel.

## 2023-02-17 NOTE — Assessment & Plan Note (Signed)
Followed by hematology for IDA.  On oral iron.  Did receive venofer 08/09/22. Recent hgb 12.9.

## 2023-02-17 NOTE — Assessment & Plan Note (Signed)
Continue lovastatin.  Follow lipid panel and liver function tests.   

## 2023-02-17 NOTE — Assessment & Plan Note (Signed)
Dr Kirke Corin - 01/12/22 -  Cardiac catheterization in December showed patent stents with no obstructive disease. Continues on eliquis. Given report of indigestion - EKG - SR with no acute ischemic changes, non specific ST/T changes.  Continues on PPI as outlined.  ER evaluation.

## 2023-02-17 NOTE — Assessment & Plan Note (Addendum)
Carotid ultrasound 01/2022 - Duplex ultrasound shows <40% stenosis bilaterally. Recommended f/u one year. Recent hospitalization - CTA head left ICA stenosis no LVO. CTA neck/dopplers no significant stenosis. Continue risk factor modification.

## 2023-02-17 NOTE — Assessment & Plan Note (Addendum)
In SR today.  Follow. Continue eliquis.

## 2023-02-17 NOTE — Assessment & Plan Note (Signed)
Admitted 02/10/23 - 02/13/23 - for acute stroke. Day of presentation the patient woke up at baseline and drove to [place]. Around 11:30 AM he started to feel light headed and generally weak. This progressed to slurred speech with right sided hemiparesis and paraesthesias. CT head negative for acute bleed. CTA head left ICA stenosis no LVO. CTA neck/dopplers no significant stenosis.TTE negative for LV thrombus, LVEF >55%. MRI brain without acute infarct, old left parietal microhemorrhage and right frontal infarct. Etiology of event uncertain, acute onset most concerning for stroke, despite negative imaging may represent MRI-negative stroke given risk factors and prior infarcts, timeline of symptoms could indicate TIA . Patient with improving exam throughout admission to near baseline with mild R hemibody subjective sensory loss. PT/OT determined appropriate for outpatient therapy. Discharged on home anticoagulation with Eliquis 5mg  BID and home statin. Symptoms completely resolved while in the hospital. States he was feeling better until today. He is unsure of time, but noticed around lunch time today, he started feeling weak. On presentation today, feeling weaker. States feels like "weak as dishwater". Also reported noticing right arm symptoms returning. Describes as more of a different sensation -  numbness/tingling now. Describes the paraesthesias - similar to what he felt prior to his admission.  No slurring of speech. Also reports headache. Given return of symptoms and increased weakness, agreeable for ER evaluation.  Transferred via EMS. ER notified.

## 2023-02-17 NOTE — Assessment & Plan Note (Signed)
Saw [name]) 01/2022 - recommended f/u aortic duplex in 12 months. AAA [place] 01/22/23: unchanged from prior measuring 2.8 cm in diameter. Will continue to monitor for symptoms, otherwise recommend continued outpatient surveillance.

## 2023-02-17 NOTE — Assessment & Plan Note (Signed)
Continue to avoid antiinflammatories.  Follow metabolic panel.  

## 2023-02-17 NOTE — Assessment & Plan Note (Signed)
Followed by cardiology.  ECHO - 05/2019 - EF 55-60%.  Continues on losartan, imdur.  TTE negative for LV thrombus, LVEF >55%.

## 2023-02-17 NOTE — Assessment & Plan Note (Signed)
Admitted 02/10/23 - 02/13/23 - for acute stroke. Day of presentation the patient woke up at baseline and drove to [place]. Around 11:30 AM he started to feel light headed and generally weak. This progressed to slurred speech with right sided hemiparesis and paraesthesias. CT head negative for acute bleed. CTA head left ICA stenosis no LVO. CTA neck/dopplers no significant stenosis.TTE negative for LV thrombus, LVEF >55%. MRI brain without acute infarct, old left parietal microhemorrhage and right frontal infarct. Etiology of event uncertain, acute onset most concerning for stroke, despite negative imaging may represent MRI-negative stroke given risk factors and prior infarcts, timeline of symptoms could indicate TIA . Patient with improving exam throughout admission to near baseline with mild R hemibody subjective sensory loss. PT/OT determined appropriate for outpatient therapy. Discharged on home anticoagulation with Eliquis 5mg  BID and home statin.  Symptoms completely resolved while in the hospital. Concern over possible TIA. States he was feeling better until today. He is unsure of time, but noticed around lunch time today, he started feeling weak. On presentation today, feeling weaker. States feels like "weak as dishwater". Also reported noticing right arm symptoms returning. Describes as more of a different sensation -  numbness/tingling now. Describes the paraesthesias - similar to what he felt prior to his admission.  No slurring of speech. Also reports headache. Taking tylenol today. Discussed given increased weakness and similar right arm paraesthesias, need for further evaluation/observation at hospital. Agreeable for transfer to hospital.  EMS transported. ER notified. Continue eliquis.

## 2023-02-20 ENCOUNTER — Inpatient Hospital Stay: Payer: Medicare HMO

## 2023-02-20 ENCOUNTER — Encounter: Payer: Self-pay | Admitting: Internal Medicine

## 2023-02-20 ENCOUNTER — Inpatient Hospital Stay: Payer: Medicare HMO | Admitting: Internal Medicine

## 2023-02-20 VITALS — BP 143/62 | HR 92 | Temp 96.7°F | Ht 70.0 in | Wt 216.0 lb

## 2023-02-20 DIAGNOSIS — C61 Malignant neoplasm of prostate: Secondary | ICD-10-CM | POA: Diagnosis not present

## 2023-02-20 NOTE — Progress Notes (Signed)
[facility] OFFICE PROGRESS NOTE  Patient Care Team: [place], MD as PCP - General (Internal Medicine) Iran Ouch, MD as PCP - Cardiology (Cardiology) Iran Ouch, MD as Consulting Physician (Cardiology) Earna Coder, MD as Consulting Physician (Hematology and Oncology) Gilda Crease, Latina Craver, MD (Vascular Surgery) Louellen Molder, NP as Nurse Practitioner (Gastroenterology)   Cancer Staging  No matching staging information was found for the patient.    Oncology History Overview Note  Nov 2017- completed IM RT radiation therapy to his prostate and pelvic nodes for Gleason 7 (4+3) adenocarcinoma the prostate presenting the PSA of 7.8.  # JAN 2019- METASTATIC PROSTATE CA to Bone [low volume met on bone scan; CT- NED]; PSA ~50; March 25th 2019- Lupron 45 mg IM [Urology q 76M]; June 26, 2018-Eligard every 3 months[intolerance to Lupron/question A. Fib/injection site pain  # May 23rd Zytiga 250mg /day; no prednisone; STOPPED MARCH 17th 2021-repeated severe hypokalemia; Eligard  # Barretts- EGD/ colo Ivin Poot 2021; Dr.Byrnett].   # Charm Rings, [place]]; Oct 2019-paroxysmal A.fib/not on eliquis; [Dr.Khan] -Dr.Arida ; Dizzy spells [Dr.Shah] s/p PPM [2023]  ------------------------------------------------------------------  DIAGNOSIS: [ JAN 2019]- Met- PROSTATE CANCER  STAGE:  IV       ;GOALS: PALLIATIVE     Prostate cancer (HCC)    INTERVAL HISTORY: Patient is with his wife.  He is walking with a cane.   Sanjuana Kava 87 y.o.  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  In the interim patient patient had slurring of speech weakness of the right upper extremity-concerning for stroke.  However this resolved after couple of hours.  Patient was evaluated at [facility] with [facility].  Patient was continued on  Eliquis.   Patient continues to have some spells of lightheadednes-however no falls.  Continues have intermittent hemorrhoid that bleeds on the toilet tissue bright red blood.   No pain in pelvis. Voiding well. Appetite is fair. Occ hot flashes remain.   Review of Systems  Constitutional:  Negative for chills, diaphoresis, fever, malaise/fatigue and weight loss.  HENT:  Negative for nosebleeds and sore throat.   Eyes:  Negative for double vision.  Respiratory:  Negative for cough, hemoptysis, sputum production, shortness of breath and wheezing.   Cardiovascular:  Negative for chest pain, palpitations, orthopnea and leg swelling.  Gastrointestinal:  Negative for abdominal pain, blood in stool, constipation, diarrhea, heartburn, melena, nausea and vomiting.  Genitourinary:  Negative for dysuria, frequency and urgency.  Musculoskeletal:  Positive for myalgias. Negative for back pain and joint pain.  Skin: Negative.  Negative for itching and rash.  Neurological:  Negative for tingling, focal weakness, weakness and headaches.  Endo/Heme/Allergies:  Does not bruise/bleed easily.  Psychiatric/Behavioral:  Negative for depression. The patient is not nervous/anxious and does not have insomnia.     PAST MEDICAL HISTORY :  Past Medical History:  Diagnosis Date   (HFpEF) heart failure with preserved ejection fraction (HCC)    a. 05/2018 Echo: Nl EF, Gr1 DD, sev dil LA, mild MR/TR, mild PAH; b. 05/2019 Echo: EF 55-60%, no rwma, mild LVH. Nl PASP. Mildly dil LA. Triv MR. Mild AoV sclerosis w/o stenosis. Mildly dil Asc AO (39mm).   Allergic rhinitis    Barrett's esophagus 10/21/2013   Bone cancer (HCC)    CKD (chronic kidney disease), stage III (HCC)    Colon polyp, hyperplastic  COPD (chronic obstructive pulmonary disease) (HCC)    mild COPD. former smoker   Coronary artery disease    a. 2003 s/p PCI ([facility]); b. 2004 s/p PCI x 2 ([facility]); c. 11/2017 Cath: LM nl, LAD mild dzs, LCX patent stent w/  30% distal edge restenosis, RCA patent distal stent w/ 30% prox edge stenosis. Nl EF->Med Rx;   Diverticulosis    Fundic gland polyps of stomach, benign    Gastritis    GERD (gastroesophageal reflux disease)    Hypercholesteremia    Hypertension    Prostate cancer (HCC)    Prostatism    Reflux esophagitis    Renal stones    Skin cancer    left cheek/lesion excised   Sleep apnea    Tachycardia    a. 11/2017 admit w/ tachycardia/? afib-->no afib noted upon review (amio/OAC d/c'd).   TIA (transient ischemic attack)     PAST SURGICAL HISTORY :   Past Surgical History:  Procedure Laterality Date   CARDIAC CATHETERIZATION     COLONOSCOPY WITH PROPOFOL N/A 09/13/2015   Procedure: COLONOSCOPY WITH PROPOFOL;  Surgeon: Christena Deem, MD;  Location: [facility] ENDOSCOPY;  Service: Endoscopy;  Laterality: N/A;   COLONOSCOPY WITH PROPOFOL N/A 05/07/2019   Procedure: COLONOSCOPY WITH PROPOFOL;  Surgeon: Earline Mayotte, MD;  Location: [facility] ENDOSCOPY;  Service: Endoscopy;  Laterality: N/A;   CORONARY ANGIOPLASTY     ESOPHAGOGASTRODUODENOSCOPY (EGD) WITH PROPOFOL N/A 09/13/2015   Procedure: ESOPHAGOGASTRODUODENOSCOPY (EGD) WITH PROPOFOL;  Surgeon: Christena Deem, MD;  Location: [facility] ENDOSCOPY;  Service: Endoscopy;  Laterality: N/A;   ESOPHAGOGASTRODUODENOSCOPY (EGD) WITH PROPOFOL N/A 12/28/2015   Procedure: ESOPHAGOGASTRODUODENOSCOPY (EGD) WITH PROPOFOL;  Surgeon: Christena Deem, MD;  Location: [facility] ENDOSCOPY;  Service: Endoscopy;  Laterality: N/A;   ESOPHAGOGASTRODUODENOSCOPY (EGD) WITH PROPOFOL N/A 06/16/2016   Procedure: ESOPHAGOGASTRODUODENOSCOPY (EGD) WITH PROPOFOL;  Surgeon: Christena Deem, MD;  Location: [facility] ENDOSCOPY;  Service: Endoscopy;  Laterality: N/A;   ESOPHAGOGASTRODUODENOSCOPY (EGD) WITH PROPOFOL N/A 05/07/2019   Procedure: ESOPHAGOGASTRODUODENOSCOPY (EGD) WITH PROPOFOL;  Surgeon: Earline Mayotte, MD;  Location: [facility] ENDOSCOPY;  Service: Endoscopy;  Laterality: N/A;   EYE  SURGERY  2013   CATARACT EXTRACTION   GANGLION CYST EXCISION Right 05/18/2017   Procedure: REMOVAL GANGLION CYST ANKLE;  Surgeon: Gwyneth Revels, DPM;  Location: [facility] ORS;  Service: Podiatry;  Laterality: Right;   heart cath stent     LEFT HEART CATH AND CORONARY ANGIOGRAPHY Right 12/03/2017   Procedure: Left Heart Cath and Coronary Angiography with possible coronary intervention;  Surgeon: Laurier Nancy, MD;  Location: [facility] INVASIVE CV LAB;  Service: Cardiovascular;  Laterality: Right;   PACEMAKER IMPLANT N/A 04/04/2021   Procedure: PACEMAKER IMPLANT;  Surgeon: [facility] Salvia, MD;  Location: [facility] INVASIVE CV LAB;  Service: Cardiovascular;  Laterality: N/A;   PROSTATE BIOPSY     RIGHT/LEFT HEART CATH AND CORONARY ANGIOGRAPHY N/A 02/07/2021   Procedure: RIGHT/LEFT HEART CATH AND CORONARY ANGIOGRAPHY;  Surgeon: Iran Ouch, MD;  Location: [facility] INVASIVE CV LAB;  Service: Cardiovascular;  Laterality: N/A;   stents     multiple    FAMILY HISTORY :   Family History  Problem Relation Age of Onset   Cancer Mother        gastric and lung   Cancer Father        multiple myeloma   Stroke Father    Cancer Sister        leukemia   Cancer Brother  leukemia   Cancer Brother        kidney   Cancer Daughter        Uterine   Cancer Other        [place] Clinical cytogeneticist Son): Prostate    SOCIAL HISTORY:   Social History   Tobacco Use   Smoking status: Former    Current packs/day: 0.00    Average packs/day: 1 pack/day for 35.0 years (35.0 ttl pk-yrs)    Types: Cigarettes    Start date: 03/07/1951    Quit date: 03/06/1986    Years since quitting: 36.9   Smokeless tobacco: Former    Types: Chew    Quit date: 1985  Vaping Use   Vaping status: Never Used  Substance Use Topics   Alcohol use: No   Drug use: No    ALLERGIES:  is allergic to atorvastatin.  MEDICATIONS:  Current Outpatient Medications  Medication Sig Dispense Refill   apixaban (ELIQUIS) 5 MG TABS tablet Take 1 tablet (5  mg total) by mouth 2 (two) times daily. 180 tablet 3   Calcium Carb-Cholecalciferol (CALCIUM + D3 PO) Take 1 tablet by mouth daily.     cholecalciferol (VITAMIN D) 1000 units tablet Take 1,000 Units by mouth daily.      ferrous sulfate 324 MG TBEC Take 324 mg by mouth daily.     furosemide (LASIX) 40 MG tablet Take 1 tablet (40 mg total) by mouth every other day. 45 tablet 3   isosorbide mononitrate (IMDUR) 30 MG 24 hr tablet Take 1 tablet (30 mg total) by mouth daily. 90 tablet 2   Leuprolide Acetate, 3 Month, (ELIGARD) 22.5 MG injection Inject 22.5 mg into the skin every 4 (four) months.     losartan (COZAAR) 50 MG tablet Take 1 tablet (50 mg total) by mouth daily. 90 tablet 1   lovastatin (MEVACOR) 40 MG tablet Take 2 tablets (80 mg total) by mouth at bedtime. 180 tablet 1   metoprolol succinate (TOPROL-XL) 25 MG 24 hr tablet TAKE 1 TABLET BY MOUTH ONCE DAILY 90 tablet 1   Multiple Vitamin (MULTI-VITAMINS) TABS Take 1 tablet by mouth daily.      pantoprazole (PROTONIX) 40 MG tablet Take 40 mg by mouth 2 (two) times daily.      potassium chloride (KLOR-CON M) 10 MEQ tablet Take 1 tablet by mouth every other day with Furosemide. 45 tablet 3   No current facility-administered medications for this visit.    PHYSICAL EXAMINATION: ECOG PERFORMANCE STATUS: 0 - Asymptomatic  BP (!) 143/62 (BP Location: Left Arm, Patient Position: Sitting, Cuff Size: Normal)   Pulse 92   Temp (!) 96.7 F (35.9 C) (Tympanic)   Ht 5\' 10"  (1.778 m)   Wt 216 lb (98 kg)   SpO2 100%   BMI 30.99 kg/m   Filed Weights   02/20/23 1313  Weight: 216 lb (98 kg)    Patient in wheelchair; left leg in the brace  Physical Exam HENT:     Head: Normocephalic and atraumatic.     Mouth/Throat:     Pharynx: No oropharyngeal exudate.  Eyes:     Pupils: Pupils are equal, round, and reactive to light.  Cardiovascular:     Rate and Rhythm: Normal rate and regular rhythm.  Pulmonary:     Effort: No respiratory  distress.     Breath sounds: No wheezing.  Abdominal:     General: Bowel sounds are normal. There is no distension.     Palpations:  Abdomen is soft. There is no mass.     Tenderness: There is no abdominal tenderness. There is no guarding or rebound.  Musculoskeletal:        General: No tenderness. Normal range of motion.     Cervical back: Normal range of motion and neck supple.  Skin:    General: Skin is warm.  Neurological:     Mental Status: He is alert and oriented to person, place, and time.  Psychiatric:        Mood and Affect: Affect normal.     LABORATORY DATA:  I have reviewed the data as listed    Component Value Date/Time   NA 139 02/16/2023 1542   NA 141 02/04/2021 0810   NA 140 09/16/2013 0438   K 4.2 02/16/2023 1542   K 4.2 09/16/2013 0438   CL 105 02/16/2023 1542   CL 109 (H) 09/16/2013 0438   CO2 26 02/16/2023 1542   CO2 24 09/16/2013 0438   GLUCOSE 104 (H) 02/16/2023 1542   GLUCOSE 101 (H) 09/16/2013 0438   BUN 23 02/16/2023 1542   BUN 18 02/04/2021 0810   BUN 20 (H) 09/16/2013 0438   CREATININE 1.33 (H) 02/16/2023 1542   CREATININE 1.30 (H) 02/16/2023 1302   CREATININE 1.28 09/16/2013 0438   CALCIUM 9.2 02/16/2023 1542   CALCIUM 8.3 (L) 09/16/2013 0438   PROT 6.7 02/16/2023 1542   PROT 6.7 09/15/2013 0755   ALBUMIN 3.9 02/16/2023 1542   ALBUMIN 3.5 09/15/2013 0755   AST 19 02/16/2023 1542   AST 18 02/16/2023 1302   ALT 17 02/16/2023 1542   ALT 15 02/16/2023 1302   ALT 28 09/15/2013 0755   ALKPHOS 45 02/16/2023 1542   ALKPHOS 44 (L) 09/15/2013 0755   BILITOT 0.5 02/16/2023 1542   BILITOT 0.6 02/16/2023 1302   GFRNONAA 52 (L) 02/16/2023 1542   GFRNONAA 54 (L) 02/16/2023 1302   GFRNONAA 54 (L) 09/16/2013 0438   GFRAA 59 (L) 10/07/2019 1305   GFRAA >60 09/16/2013 0438    No results found for: "SPEP", "UPEP"  Lab Results  Component Value Date   WBC 5.2 02/16/2023   NEUTROABS 3.1 02/16/2023   HGB 12.7 (L) 02/16/2023   HCT 38.2 (L)  02/16/2023   MCV 91.0 02/16/2023   PLT 184 02/16/2023      Chemistry      Component Value Date/Time   NA 139 02/16/2023 1542   NA 141 02/04/2021 0810   NA 140 09/16/2013 0438   K 4.2 02/16/2023 1542   K 4.2 09/16/2013 0438   CL 105 02/16/2023 1542   CL 109 (H) 09/16/2013 0438   CO2 26 02/16/2023 1542   CO2 24 09/16/2013 0438   BUN 23 02/16/2023 1542   BUN 18 02/04/2021 0810   BUN 20 (H) 09/16/2013 0438   CREATININE 1.33 (H) 02/16/2023 1542   CREATININE 1.30 (H) 02/16/2023 1302   CREATININE 1.28 09/16/2013 0438      Component Value Date/Time   CALCIUM 9.2 02/16/2023 1542   CALCIUM 8.3 (L) 09/16/2013 0438   ALKPHOS 45 02/16/2023 1542   ALKPHOS 44 (L) 09/15/2013 0755   AST 19 02/16/2023 1542   AST 18 02/16/2023 1302   ALT 17 02/16/2023 1542   ALT 15 02/16/2023 1302   ALT 28 09/15/2013 0755   BILITOT 0.5 02/16/2023 1542   BILITOT 0.6 02/16/2023 1302       RADIOGRAPHIC STUDIES: I have personally reviewed the radiological images as listed and agreed with  the findings in the report. No results found.   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 Stage IV-on Eligard; Bone scan October 2020-improved;  SEP 2023 PSA- <0.1.  Currently getting intermittent Eligard. DEC 2024- PSA <0.01.  Hold Eligard today.  If significantly elevated would recommend Eligard. If PSA rising would consider bone scan. Stable.   # Iron deficient anemia/CKD- III-[s/p EGD March 2021]--status post IV iron infusion x1 [FEB 2022]-DEC  2024-hemoglobin is 12.7- iron sat 17%; Overall stable.  Continue oral iron; #Recommend gentle iron [iron biglycinate; 28 mg ] 1 pill a day.  This pill is unlikely to cause stomach upset or cause constipation. HOLD venofer today- Stable.   # Nov 26th, 2023-acute PE -lobar and segmental PE [[facility]]/ A.fib - on Eliquis [no asprin/plavix]-provoked left foot surgery. However, pt on indefinite Eliquis 5 mg twice daily. Stable.    # TIA-Dec 2024/ [Hx of stroke]- MRI- [facility]- negative for any new stroke; old strokes- stable; again discussed re: bleeding precautions on eliquis.   #CKD stage III-GFR 50s.  Clinically stable.  Eligard 22.5 q4 m- ? nov 2023.  # DISPOSITION:  # HOLD eligard; # HOLD Venofer today # Follow up in 3 months-  MD; 2-3 days prior- labs- cbc.cmp/iron studies; ferritin; PSA eligard; possible venofer- Dr.B  Cc; Dr.Scott/  Orders Placed This Encounter  Procedures   CBC with Differential (Cancer Center Only)    Standing Status:   Future    Expected Date:   05/17/2023    Expiration Date:   02/20/2024   CMP (Cancer Center only)    Standing Status:   Future    Expected Date:   05/17/2023    Expiration Date:   02/20/2024   Iron and TIBC    Standing Status:   Future    Expected Date:   05/17/2023    Expiration Date:   02/20/2024   Ferritin    Standing Status:   Future    Expected Date:   05/17/2023    Expiration Date:   02/20/2024   PSA    Standing Status:   Future    Expected Date:   05/17/2023    Expiration Date:   02/20/2024   All questions were answered. The patient knows to call the clinic with any problems, questions or concerns.      Earna Coder, MD 02/20/2023 1:53 PM

## 2023-02-20 NOTE — Patient Instructions (Signed)
#  Recommend gentle iron [iron biglycinate; 28 mg ] 1 pill a day.  This pill is unlikely to cause stomach upset or cause constipation.  

## 2023-02-20 NOTE — Progress Notes (Signed)
Had a stroke 1.5 weeks ago, [facility]. His GI doctor([place], PA @ [facility]) would like you to take a look at imaging done at [facility].  C/o  indigestion, takes tums and protonix bid.  Having some spells of lightheadedness.

## 2023-02-20 NOTE — Assessment & Plan Note (Addendum)
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  Currently getting intermittent Eligard. DEC 2024- PSA <0.01.  Hold Eligard today.  If significantly elevated would recommend Eligard. If PSA rising would consider bone scan. Stable.   # Iron deficient anemia/CKD- III-[s/p EGD March 2021]--status post IV iron infusion x1 [FEB 2022]-DEC  2024-hemoglobin is 12.7- iron sat 17%; Overall stable.  Continue oral iron; #Recommend gentle iron [iron biglycinate; 28 mg ] 1 pill a day.  This pill is unlikely to cause stomach upset or cause constipation. HOLD venofer today- Stable.   # Nov 26th, 2023-acute PE -lobar and segmental PE [[facility]]/ A.fib - on Eliquis [no asprin/plavix]-provoked left foot surgery. However, pt on indefinite Eliquis 5 mg twice daily. Stable.   # TIA-Dec 2024/ [Hx of stroke]- MRI- [facility]- negative for any new stroke; old strokes- stable; again discussed re: bleeding precautions on eliquis.  Stable.    #CKD stage III-GFR 50s. Clinically stable.  Eligard 22.5 q4 m- ? nov 2023.  # DISPOSITION:  # HOLD eligard; # HOLD Venofer today # Follow up in 3 months-  MD; 2-3 days prior- labs- cbc.cmp/iron studies; ferritin; PSA eligard; possible venofer- Dr.B  Cc; Dr.Scott/

## 2023-02-21 ENCOUNTER — Encounter: Payer: Self-pay | Admitting: Internal Medicine

## 2023-02-26 ENCOUNTER — Telehealth: Payer: Self-pay | Admitting: Cardiovascular Disease

## 2023-02-26 NOTE — Telephone Encounter (Signed)
Chart reviewed.  It appears that he had a small stroke.  He should continue Eliquis.

## 2023-02-26 NOTE — Telephone Encounter (Signed)
Spoke to patient's spouse and informed her of the following:  "Chart reviewed. It appears that he had a small stroke. He should continue Eliquis."  Patient's spouse was in agreement and stated "We will see you at our follow-up appointment on Friday February 14th"

## 2023-02-26 NOTE — Telephone Encounter (Signed)
Wife is calling in to see if Dr. Kirke Corin can look at the patient chart. She states the patient had a stroke. Please advise

## 2023-03-07 NOTE — Progress Notes (Signed)
 Subjective:    Patient ID: Nathaniel Thompson, male    DOB: 24-Feb-1936, 88 y.o.   MRN: [id number]  Patient here for  Chief Complaint  Patient presents with   Medical Management of Chronic Issues    4 mth f/u    HPI Here for a scheduled follow up - Admitted 02/10/23 - 02/13/23 - for acute stroke. Day of presentation the patient woke up at baseline and drove to [place]. Around 11:30 AM he started to feel light headed and generally weak. This progressed to slurred speech with right sided hemiparesis and paraesthesias. CT head negative for acute bleed. CTA head left ICA stenosis no LVO. CTA neck/dopplers no significant stenosis.TTE negative for LV thrombus, LVEF >55%. MRI brain without acute infarct, old left parietal microhemorrhage and right frontal infarct. Etiology of event uncertain, acute onset most concerning for stroke, despite negative imaging may represent MRI-negative stroke given risk factors and prior infarcts, timeline of symptoms could indicate TIA . Patient with improving exam throughout admission to near baseline with mild R hemibody subjective sensory loss. PT/OT determined appropriate for outpatient therapy. Discharged on home anticoagulation with Eliquis  5mg  BID and home statin. AAA US  01/22/23: unchanged from prior measuring 2.8 cm in diameter. Will continue to monitor for symptoms, otherwise recommend continued outpatient surveillance. Follow up for repeat CT chest in 3 months for incidental finding of right basilar subpleural nodule measuring up to 1.0 cm and 8 mm right lower lobe pulmonary nodule. Hospital follow up - had worsening symptoms and was transferred back to ER for further evaluation.  Left ER without being seen. Seeing hematology - f/u IDA.  Recommended taking gentle iron . He is doing better.  Feels better.  Breathing stable. No chest pain.  No abdominal pain.  Bowels stable. Walking. Discussed CT scan f/u for above lung nodule as outlined.   Past Medical History:   Diagnosis Date   (HFpEF) heart failure with preserved ejection fraction (HCC)    a. 05/2018 Echo: Nl EF, Gr1 DD, sev dil LA, mild MR/TR, mild PAH; b. 05/2019 Echo: EF 55-60%, no rwma, mild LVH. Nl PASP. Mildly dil LA. Triv MR. Mild AoV sclerosis w/o stenosis. Mildly dil Asc AO (39mm).   Allergic rhinitis    Barrett's esophagus 10/21/2013   Bone cancer (HCC)    CKD (chronic kidney disease), stage III (HCC)    Colon polyp, hyperplastic    COPD (chronic obstructive pulmonary disease) (HCC)    mild COPD. former smoker   Coronary artery disease    a. 2003 s/p PCI ([facility]); b. 2004 s/p PCI x 2 ([facility]); c. 11/2017 Cath: LM nl, LAD mild dzs, LCX patent stent w/ 30% distal edge restenosis, RCA patent distal stent w/ 30% prox edge stenosis. Nl EF->Med Rx;   Diverticulosis    Fundic gland polyps of stomach, benign    Gastritis    GERD (gastroesophageal reflux disease)    Hypercholesteremia    Hypertension    Prostate cancer (HCC)    Prostatism    Reflux esophagitis    Renal stones    Skin cancer    left cheek/lesion excised   Sleep apnea    Tachycardia    a. 11/2017 admit w/ tachycardia/? afib-->no afib noted upon review (amio/OAC d/c'd).   TIA (transient ischemic attack)    Past Surgical History:  Procedure Laterality Date   CARDIAC CATHETERIZATION     COLONOSCOPY WITH PROPOFOL  N/A 09/13/2015   Procedure: COLONOSCOPY WITH PROPOFOL ;  Surgeon: Gladis RAYMOND Mariner, MD;  Location: [facility] ENDOSCOPY;  Service: Endoscopy;  Laterality: N/A;   COLONOSCOPY WITH PROPOFOL  N/A 05/07/2019   Procedure: COLONOSCOPY WITH PROPOFOL ;  Surgeon: Dessa Reyes ORN, MD;  Location: [facility] ENDOSCOPY;  Service: Endoscopy;  Laterality: N/A;   CORONARY ANGIOPLASTY     ESOPHAGOGASTRODUODENOSCOPY (EGD) WITH PROPOFOL  N/A 09/13/2015   Procedure: ESOPHAGOGASTRODUODENOSCOPY (EGD) WITH PROPOFOL ;  Surgeon: Gladis RAYMOND Mariner, MD;  Location: [facility] ENDOSCOPY;  Service: Endoscopy;  Laterality: N/A;   ESOPHAGOGASTRODUODENOSCOPY (EGD) WITH  PROPOFOL  N/A 12/28/2015   Procedure: ESOPHAGOGASTRODUODENOSCOPY (EGD) WITH PROPOFOL ;  Surgeon: Gladis RAYMOND Mariner, MD;  Location: [facility] ENDOSCOPY;  Service: Endoscopy;  Laterality: N/A;   ESOPHAGOGASTRODUODENOSCOPY (EGD) WITH PROPOFOL  N/A 06/16/2016   Procedure: ESOPHAGOGASTRODUODENOSCOPY (EGD) WITH PROPOFOL ;  Surgeon: Gladis RAYMOND Mariner, MD;  Location: [facility] ENDOSCOPY;  Service: Endoscopy;  Laterality: N/A;   ESOPHAGOGASTRODUODENOSCOPY (EGD) WITH PROPOFOL  N/A 05/07/2019   Procedure: ESOPHAGOGASTRODUODENOSCOPY (EGD) WITH PROPOFOL ;  Surgeon: Dessa Reyes ORN, MD;  Location: [facility] ENDOSCOPY;  Service: Endoscopy;  Laterality: N/A;   EYE SURGERY  2013   CATARACT EXTRACTION   GANGLION CYST EXCISION Right 05/18/2017   Procedure: REMOVAL GANGLION CYST ANKLE;  Surgeon: Ashley Soulier, DPM;  Location: [facility] ORS;  Service: Podiatry;  Laterality: Right;   heart cath stent     LEFT HEART CATH AND CORONARY ANGIOGRAPHY Right 12/03/2017   Procedure: Left Heart Cath and Coronary Angiography with possible coronary intervention;  Surgeon: Fernand Denyse LABOR, MD;  Location: [facility] INVASIVE CV LAB;  Service: Cardiovascular;  Laterality: Right;   PACEMAKER IMPLANT N/A 04/04/2021   Procedure: PACEMAKER IMPLANT;  Surgeon: Fernande Elspeth BROCKS, MD;  Location: [facility] INVASIVE CV LAB;  Service: Cardiovascular;  Laterality: N/A;   PROSTATE BIOPSY     RIGHT/LEFT HEART CATH AND CORONARY ANGIOGRAPHY N/A 02/07/2021   Procedure: RIGHT/LEFT HEART CATH AND CORONARY ANGIOGRAPHY;  Surgeon: Darron Deatrice LABOR, MD;  Location: [facility] INVASIVE CV LAB;  Service: Cardiovascular;  Laterality: N/A;   stents     multiple   Family History  Problem Relation Age of Onset   Cancer Mother        gastric and lung   Cancer Father        multiple myeloma   Stroke Father    Cancer Sister        leukemia   Cancer Brother        leukemia   Cancer Brother        kidney   Cancer Daughter        Uterine   Cancer Other        Nephes (Sister's Son): Prostate    Social History   Socioeconomic History   Marital status: Married    Spouse name: Glenda   Number of children: 2   Years of education: Not on file   Highest education level: Not on file  Occupational History   Occupation: retired production designer, theatre/television/film for [employer]  Tobacco Use   Smoking status: Former    Current packs/day: 0.00    Average packs/day: 1 pack/day for 35.0 years (35.0 ttl pk-yrs)    Types: Cigarettes    Start date: 03/07/1951    Quit date: 03/06/1986    Years since quitting: 37.0   Smokeless tobacco: Former    Types: Chew    Quit date: 1985  Vaping Use   Vaping status: Never Used  Substance and Sexual Activity   Alcohol use: No   Drug use: No   Sexual activity: Not Currently  Other Topics Concern   Not on file  Social History Narrative   Married, Glenda 2 step children and 2 children from previous marriage   Social Drivers of Corporate Investment Banker Strain: Low Risk  (10/19/2022)   Overall Financial Resource Strain (CARDIA)    Difficulty of Paying Living Expenses: Not hard at all  Food Insecurity: No Food Insecurity (10/19/2022)   Hunger Vital Sign    Worried About Running Out of Food in the Last Year: Never true    Ran Out of Food in the Last Year: Never true  Transportation Needs: No Transportation Needs (10/19/2022)   PRAPARE - Administrator, Civil Service (Medical): No    Lack of Transportation (Non-Medical): No  Physical Activity: Sufficiently Active (10/19/2022)   Exercise Vital Sign    Days of Exercise per Week: 5 days    Minutes of Exercise per Session: 30 min  Stress: No Stress Concern Present (10/19/2022)   [employer] of Occupational Health - Occupational Stress Questionnaire    Feeling of Stress : Not at all  Social Connections: Moderately Integrated (10/19/2022)   Social Connection and Isolation Panel [NHANES]    Frequency of Communication with Friends and Family: More than three times a week    Frequency of Social Gatherings  with Friends and Family: Three times a week    Attends Religious Services: More than 4 times per year    Active Member of Clubs or Organizations: No    Attends Banker Meetings: Never    Marital Status: Married     Review of Systems  Constitutional:  Negative for appetite change and unexpected weight change.  HENT:  Negative for congestion and sinus pressure.   Respiratory:  Negative for cough and chest tightness.        Breathing stable.   Cardiovascular:  Negative for chest pain and palpitations.       No increased leg swelling.   Gastrointestinal:  Negative for abdominal pain, diarrhea, nausea and vomiting.  Genitourinary:  Negative for difficulty urinating and dysuria.  Musculoskeletal:  Negative for joint swelling and myalgias.  Skin:  Negative for color change and rash.  Neurological:  Negative for dizziness and headaches.  Psychiatric/Behavioral:  Negative for agitation and dysphoric mood.        Objective:     BP 110/62 (Cuff Size: Large)   Pulse 62   Temp (!) 97.3 F (36.3 C) (Oral)   Resp 17   Ht 1' (0.305 m)   Wt 220 lb 12 oz (100.1 kg)   SpO2 100%   BMI 1077.81 kg/m  Wt Readings from Last 3 Encounters:  03/08/23 220 lb 12 oz (100.1 kg)  02/20/23 216 lb (98 kg)  02/16/23 214 lb 12.8 oz (97.4 kg)    Physical Exam Vitals reviewed.  Constitutional:      General: He is not in acute distress.    Appearance: Normal appearance. He is well-developed.  HENT:     Head: Normocephalic and atraumatic.     Right Ear: External ear normal.     Left Ear: External ear normal.  Eyes:     General: No scleral icterus.       Right eye: No discharge.        Left eye: No discharge.     Conjunctiva/sclera: Conjunctivae normal.  Cardiovascular:     Rate and Rhythm: Normal rate and regular rhythm.  Pulmonary:     Effort: Pulmonary effort is normal. No respiratory distress.     Breath sounds: Normal  breath sounds.  Abdominal:     General: Bowel sounds are  normal.     Palpations: Abdomen is soft.     Tenderness: There is no abdominal tenderness.  Musculoskeletal:        General: No swelling or tenderness.     Cervical back: Neck supple. No tenderness.  Lymphadenopathy:     Cervical: No cervical adenopathy.  Skin:    Findings: No erythema or rash.  Neurological:     Mental Status: He is alert.  Psychiatric:        Mood and Affect: Mood normal.        Behavior: Behavior normal.      Outpatient Encounter Medications as of 03/08/2023  Medication Sig   apixaban  (ELIQUIS ) 5 MG TABS tablet Take 1 tablet (5 mg total) by mouth 2 (two) times daily.   Calcium  Carb-Cholecalciferol  (CALCIUM  + D3 PO) Take 1 tablet by mouth daily.   cholecalciferol  (VITAMIN D ) 1000 units tablet Take 1,000 Units by mouth daily.    ferrous sulfate 324 MG TBEC Take 324 mg by mouth daily.   furosemide  (LASIX ) 40 MG tablet Take 1 tablet (40 mg total) by mouth every other day.   isosorbide  mononitrate (IMDUR ) 30 MG 24 hr tablet Take 1 tablet (30 mg total) by mouth daily.   losartan  (COZAAR ) 50 MG tablet Take 1 tablet (50 mg total) by mouth daily.   lovastatin  (MEVACOR ) 40 MG tablet Take 2 tablets (80 mg total) by mouth at bedtime.   metoprolol  succinate (TOPROL -XL) 25 MG 24 hr tablet TAKE 1 TABLET BY MOUTH ONCE DAILY   Multiple Vitamin (MULTI-VITAMINS) TABS Take 1 tablet by mouth daily.    pantoprazole  (PROTONIX ) 40 MG tablet Take 40 mg by mouth 2 (two) times daily.    potassium chloride  (KLOR-CON  M) 10 MEQ tablet Take 1 tablet by mouth every other day with Furosemide .   Leuprolide  Acetate, 3 Month, (ELIGARD ) 22.5 MG injection Inject 22.5 mg into the skin every 4 (four) months. (Patient not taking: Reported on 03/08/2023)   No facility-administered encounter medications on file as of 03/08/2023.     Lab Results  Component Value Date   WBC 5.2 02/16/2023   HGB 12.7 (L) 02/16/2023   HCT 38.2 (L) 02/16/2023   PLT 184 02/16/2023   GLUCOSE 104 (H) 02/16/2023   CHOL 112  07/28/2022   TRIG 196.0 (H) 07/28/2022   HDL 28.50 (L) 07/28/2022   LDLCALC 45 07/28/2022   ALT 17 02/16/2023   AST 19 02/16/2023   NA 139 02/16/2023   K 4.2 02/16/2023   CL 105 02/16/2023   CREATININE 1.33 (H) 02/16/2023   BUN 23 02/16/2023   CO2 26 02/16/2023   TSH 2.15 01/25/2022   PSA 5.47 (H) 08/03/2016   INR 1.2 02/16/2023   HGBA1C 5.8 07/28/2022    CT HEAD WO CONTRAST Result Date: 02/16/2023 CLINICAL DATA:  Neuro deficit, acute, stroke suspected EXAM: CT HEAD WITHOUT CONTRAST TECHNIQUE: Contiguous axial images were obtained from the base of the skull through the vertex without intravenous contrast. RADIATION DOSE REDUCTION: This exam was performed according to the departmental dose-optimization program which includes automated exposure control, adjustment of the mA and/or kV according to patient size and/or use of iterative reconstruction technique. COMPARISON:  CT November 9, 22. FINDINGS: Brain: No evidence of acute infarction, hemorrhage, hydrocephalus, extra-axial collection or mass lesion/mass effect. Small remote right inferior frontal lobe infarct. Vascular: No hyperdense vessel. Skull: No acute fracture. Sinuses/Orbits: Clear sinuses.  No acute  orbital findings. IMPRESSION: 1. No evidence of acute intracranial abnormality 2. Small remote right inferior frontal lobe infarct. Electronically Signed   By: Gilmore GORMAN Molt M.D.   On: 02/16/2023 17:28       Assessment & Plan:  Sleep apnea, unspecified type Assessment & Plan: Continue cpap.    Prostate cancer [facility]) Assessment & Plan: Seeing oncology.  Has been on eligard . Active surveillance.    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  Followed by [facility].      Iron  deficiency anemia due to chronic blood loss Assessment & Plan: Seeing hematology - f/u IDA.  Recommended taking gentle iron .    Hypertension, essential Assessment & Plan: Blood pressure as outlined.  On imdur  and losartan .   Continue to follow pressure.  Continue to monitor metabolic panel.     Hyperglycemia Assessment & Plan: Follow met b and a1c.    Hypercholesterolemia Assessment & Plan: Continue lovastatin .  Follow lipid panel and liver function tests.     History of TIA (transient ischemic attack) Assessment & Plan: Admitted 02/10/23 - 02/13/23 - for acute stroke. Day of presentation the patient woke up at baseline and drove to [place]. Around 11:30 AM he started to feel light headed and generally weak. This progressed to slurred speech with right sided hemiparesis and paraesthesias. CT head negative for acute bleed. CTA head left ICA stenosis no LVO. CTA neck/dopplers no significant stenosis.TTE negative for LV thrombus, LVEF >55%. MRI brain without acute infarct, old left parietal microhemorrhage and right frontal infarct. Etiology of event uncertain, acute onset most concerning for stroke, despite negative imaging may represent MRI-negative stroke given risk factors and prior infarcts, timeline of symptoms could indicate TIA . Patient with improving exam throughout admission to near baseline with mild R hemibody subjective sensory loss. Discharged on home anticoagulation with Eliquis  5mg  BID and home statin. F/u with neurology planned.    Coronary artery disease involving native coronary artery of native heart with angina pectoris [facility]) Assessment & Plan: Sees cardiology. Continue isosorbide , losartan , lovastatin  and metoprolol . Stable. No ches tpain. Follow.    Chronic obstructive pulmonary disease, unspecified COPD type (HCC) Assessment & Plan: Breathing stable.    Stage 3a chronic kidney disease (HCC) Assessment & Plan: Continue to avoid antiinflammatories. Follow metabolic panel.    Chronic heart failure with preserved ejection fraction [facility]) Assessment & Plan: Followed by cardiology.  ECHO - 05/2019 - EF 55-60%.  Continues on losartan , imdur .  TTE negative for LV thrombus, LVEF  >55%.   Bilateral carotid artery stenosis Assessment & Plan: Carotid ultrasound 01/2022 - Duplex ultrasound shows <40% stenosis bilaterally. Recommended f/u one year. Recent hospitalization - CTA head left ICA stenosis no LVO. CTA neck/dopplers no significant stenosis. Continue risk factor modification.    Atrial fibrillation, unspecified type [facility]) Assessment & Plan: Appears to be in SR today.  Follow. Continue eliquis .   Abdominal aortic aneurysm (AAA) without rupture, unspecified part [facility]) Assessment & Plan: Saw [facility]) 01/2022 - recommended f/u aortic duplex in 12 months. AAA US  01/22/23: unchanged from prior measuring 2.8 cm in diameter. Will continue to monitor for symptoms, otherwise recommend continued outpatient surveillance.   Lung nodule Assessment & Plan: Lung nodule found on recent CT (02/2023)as outlined. Recommended 3 month f/u CT chest. Schedule f/u chest CT.   Orders: -     CT CHEST WO CONTRAST; Future  Solitary pulmonary nodule -     CT CHEST WO CONTRAST; Future     Keileigh Vahey  [place], MD

## 2023-03-08 ENCOUNTER — Encounter: Payer: Self-pay | Admitting: Internal Medicine

## 2023-03-08 ENCOUNTER — Ambulatory Visit: Payer: Medicare HMO | Admitting: Internal Medicine

## 2023-03-08 VITALS — BP 110/62 | HR 62 | Temp 97.3°F | Resp 17 | Ht <= 58 in | Wt 220.8 lb

## 2023-03-08 DIAGNOSIS — R739 Hyperglycemia, unspecified: Secondary | ICD-10-CM | POA: Diagnosis not present

## 2023-03-08 DIAGNOSIS — I6523 Occlusion and stenosis of bilateral carotid arteries: Secondary | ICD-10-CM

## 2023-03-08 DIAGNOSIS — R911 Solitary pulmonary nodule: Secondary | ICD-10-CM

## 2023-03-08 DIAGNOSIS — I10 Essential (primary) hypertension: Secondary | ICD-10-CM

## 2023-03-08 DIAGNOSIS — E7800 Pure hypercholesterolemia, unspecified: Secondary | ICD-10-CM

## 2023-03-08 DIAGNOSIS — G4730 Sleep apnea, unspecified: Secondary | ICD-10-CM | POA: Diagnosis not present

## 2023-03-08 DIAGNOSIS — N1831 Chronic kidney disease, stage 3a: Secondary | ICD-10-CM

## 2023-03-08 DIAGNOSIS — J449 Chronic obstructive pulmonary disease, unspecified: Secondary | ICD-10-CM

## 2023-03-08 DIAGNOSIS — I724 Aneurysm of artery of lower extremity: Secondary | ICD-10-CM

## 2023-03-08 DIAGNOSIS — I25119 Atherosclerotic heart disease of native coronary artery with unspecified angina pectoris: Secondary | ICD-10-CM

## 2023-03-08 DIAGNOSIS — I5032 Chronic diastolic (congestive) heart failure: Secondary | ICD-10-CM

## 2023-03-08 DIAGNOSIS — D500 Iron deficiency anemia secondary to blood loss (chronic): Secondary | ICD-10-CM

## 2023-03-08 DIAGNOSIS — Z8673 Personal history of transient ischemic attack (TIA), and cerebral infarction without residual deficits: Secondary | ICD-10-CM

## 2023-03-08 DIAGNOSIS — I4891 Unspecified atrial fibrillation: Secondary | ICD-10-CM

## 2023-03-08 DIAGNOSIS — C61 Malignant neoplasm of prostate: Secondary | ICD-10-CM

## 2023-03-08 DIAGNOSIS — I714 Abdominal aortic aneurysm, without rupture, unspecified: Secondary | ICD-10-CM

## 2023-03-11 ENCOUNTER — Encounter: Payer: Self-pay | Admitting: Internal Medicine

## 2023-03-11 DIAGNOSIS — R911 Solitary pulmonary nodule: Secondary | ICD-10-CM | POA: Insufficient documentation

## 2023-03-11 NOTE — Assessment & Plan Note (Signed)
Continue to avoid antiinflammatories.  Follow metabolic panel.  

## 2023-03-11 NOTE — Assessment & Plan Note (Signed)
 Breathing stable.

## 2023-03-11 NOTE — Assessment & Plan Note (Signed)
 Continue cpap.

## 2023-03-11 NOTE — Assessment & Plan Note (Signed)
 Followed by cardiology.  ECHO - 05/2019 - EF 55-60%.  Continues on losartan, imdur.  TTE negative for LV thrombus, LVEF >55%.

## 2023-03-11 NOTE — Assessment & Plan Note (Signed)
Blood pressure as outlined.  On imdur and losartan.  Continue to follow pressure.  Continue to monitor metabolic panel.

## 2023-03-11 NOTE — Assessment & Plan Note (Signed)
 Saw [name]) 01/2022 - recommended f/u aortic duplex in 12 months. AAA [place] 01/22/23: unchanged from prior measuring 2.8 cm in diameter. Will continue to monitor for symptoms, otherwise recommend continued outpatient surveillance.

## 2023-03-11 NOTE — Assessment & Plan Note (Signed)
 Lung nodule found on recent CT (02/2023)as outlined. Recommended 3 month f/u CT chest. Schedule f/u chest CT.

## 2023-03-11 NOTE — Assessment & Plan Note (Signed)
Evaluated by [facility].  Followed by [facility].

## 2023-03-11 NOTE — Assessment & Plan Note (Signed)
 Sees cardiology. Continue isosorbide, losartan, lovastatin and metoprolol. Stable. No ches tpain. Follow.

## 2023-03-11 NOTE — Assessment & Plan Note (Signed)
 Follow met b and a1c.

## 2023-03-11 NOTE — Assessment & Plan Note (Signed)
Appears to be in SR today.  Follow. Continue eliquis.

## 2023-03-11 NOTE — Assessment & Plan Note (Signed)
 Admitted 02/10/23 - 02/13/23 - for acute stroke. Day of presentation the patient woke up at baseline and drove to [place]. Around 11:30 AM he started to feel light headed and generally weak. This progressed to slurred speech with right sided hemiparesis and paraesthesias. CT head negative for acute bleed. CTA head left ICA stenosis no LVO. CTA neck/dopplers no significant stenosis.TTE negative for LV thrombus, LVEF >55%. MRI brain without acute infarct, old left parietal microhemorrhage and right frontal infarct. Etiology of event uncertain, acute onset most concerning for stroke, despite negative imaging may represent MRI-negative stroke given risk factors and prior infarcts, timeline of symptoms could indicate TIA . Patient with improving exam throughout admission to near baseline with mild R hemibody subjective sensory loss. Discharged on home anticoagulation with Eliquis  5mg  BID and home statin. F/u with neurology planned.

## 2023-03-11 NOTE — Assessment & Plan Note (Signed)
 Carotid ultrasound 01/2022 - Duplex ultrasound shows <40% stenosis bilaterally. Recommended f/u one year. Recent hospitalization - CTA head left ICA stenosis no LVO. CTA neck/dopplers no significant stenosis. Continue risk factor modification.

## 2023-03-11 NOTE — Assessment & Plan Note (Signed)
 Continue lovastatin.  Follow lipid panel and liver function tests.

## 2023-03-11 NOTE — Assessment & Plan Note (Addendum)
 Seeing hematology - f/u IDA.  Recommended taking gentle iron.

## 2023-03-11 NOTE — Assessment & Plan Note (Signed)
 Seeing oncology.  Has been on eligard. Active surveillance.

## 2023-03-14 ENCOUNTER — Telehealth: Payer: Self-pay

## 2023-03-14 NOTE — Telephone Encounter (Signed)
 Copied from CRM [id number]. Topic: Clinical - Medical Advice >> Mar 14, 2023  8:18 AM Dennison Nancy wrote: Reason for CRM: Patient coughing for 2 days and have head congestion, no fever  Concern had RSV last year  Requesting a same day appointment

## 2023-03-14 NOTE — Telephone Encounter (Signed)
 Patient already has an appointment...Marland KitchenMarland Kitchen

## 2023-03-15 ENCOUNTER — Ambulatory Visit (INDEPENDENT_AMBULATORY_CARE_PROVIDER_SITE_OTHER): Payer: Medicare HMO

## 2023-03-15 ENCOUNTER — Encounter: Payer: Self-pay | Admitting: Nurse Practitioner

## 2023-03-15 ENCOUNTER — Ambulatory Visit: Payer: Medicare HMO | Admitting: Nurse Practitioner

## 2023-03-15 VITALS — BP 118/72 | HR 81 | Temp 98.0°F | Wt 216.2 lb

## 2023-03-15 DIAGNOSIS — R0602 Shortness of breath: Secondary | ICD-10-CM

## 2023-03-15 DIAGNOSIS — R058 Other specified cough: Secondary | ICD-10-CM | POA: Diagnosis not present

## 2023-03-15 DIAGNOSIS — J069 Acute upper respiratory infection, unspecified: Secondary | ICD-10-CM | POA: Diagnosis not present

## 2023-03-15 MED ORDER — AMOXICILLIN-POT CLAVULANATE 875-125 MG PO TABS: 1.0000 | ORAL_TABLET | Freq: Two times a day (BID) | ORAL | 0 refills | Status: DC

## 2023-03-15 MED ORDER — BENZONATATE 100 MG PO CAPS: 100.0000 mg | ORAL_CAPSULE | Freq: Three times a day (TID) | ORAL | 0 refills | Status: DC | PRN

## 2023-03-15 NOTE — Patient Instructions (Signed)
 Please go to the lab for Chest X-ray. Continue take Mucinex and take OTC antihistamine for post nasal drip. Incresae fluid intake and rest.

## 2023-03-15 NOTE — Progress Notes (Signed)
 Established Patient Office Visit  Subjective:  Patient ID: Nathaniel Thompson, male    DOB: May 07, 1935  Age: 88 y.o. MRN: [id number]  CC:  Chief Complaint  Patient presents with   Cough    Cough, head & chest congestion & short winded x 2 days   Discussed the use of a AI scribe  software for clinical note transcription with the patient, who gave verbal consent to proceed.  HPI  Nathaniel Thompson present with a chief complaint of chest tightness, coughing, and sinus congestion. The symptoms began on Tuesday, with the worst day being Wednesday. The patient describes the chest tightness as continuous, likely exacerbated by coughing. He also reports a postnasal drip, which he believes is contributing to his symptoms.  The patient has been using Flonase  nasal spray and Mucinex  to manage these symptoms. He reports that the Mucinex  has been helpful in breaking up the congestion and facilitating the expectoration of phlegm, which has been green in color.  In addition to these symptoms, the patient has a history of shortness of breath upon exertion, which has been a long-standing issue. He has a pacemaker, which was implanted over a year ago. He also suffered a stroke approximately five to six weeks ago, for which he is under the care of a neurologist. He reports occasional sharp headaches since the stroke, but these are not daily and seem to be decreasing in frequency.  The patient denies any fever, body aches, or pains. He also denies any tenderness in the sinus area. He has not been on any antibiotics recently and reports no known allergies, except to statin medication. He has been drinking water and taking over-the-counter medications to manage his symptoms.  HPI   Past Medical History:  Diagnosis Date   (HFpEF) heart failure with preserved ejection fraction (HCC)    a. 05/2018 Echo: Nl EF, Gr1 DD, sev dil LA, mild MR/TR, mild PAH; b. 05/2019 Echo: EF 55-60%, no rwma, mild LVH. Nl PASP. Mildly dil  LA. Triv MR. Mild AoV sclerosis w/o stenosis. Mildly dil Asc AO (39mm).   Allergic rhinitis    Barrett's esophagus 10/21/2013   Bone cancer (HCC)    CKD (chronic kidney disease), stage III (HCC)    Colon polyp, hyperplastic    COPD (chronic obstructive pulmonary disease) (HCC)    mild COPD. former smoker   Coronary artery disease    a. 2003 s/p PCI ([facility]); b. 2004 s/p PCI x 2 ([facility]); c. 11/2017 Cath: LM nl, LAD mild dzs, LCX patent stent w/ 30% distal edge restenosis, RCA patent distal stent w/ 30% prox edge stenosis. Nl EF->Med Rx;   Diverticulosis    Fundic gland polyps of stomach, benign    Gastritis    GERD (gastroesophageal reflux disease)    Hypercholesteremia    Hypertension    Prostate cancer (HCC)    Prostatism    Reflux esophagitis    Renal stones    Skin cancer    left cheek/lesion excised   Sleep apnea    Tachycardia    a. 11/2017 admit w/ tachycardia/? afib-->no afib noted upon review (amio/OAC d/c'd).   TIA (transient ischemic attack)     Past Surgical History:  Procedure Laterality Date   CARDIAC CATHETERIZATION     COLONOSCOPY WITH PROPOFOL  N/A 09/13/2015   Procedure: COLONOSCOPY WITH PROPOFOL ;  Surgeon: Gladis RAYMOND Mariner, MD;  Location: [facility] ENDOSCOPY;  Service: Endoscopy;  Laterality: N/A;   COLONOSCOPY WITH PROPOFOL  N/A 05/07/2019   Procedure:  COLONOSCOPY WITH PROPOFOL ;  Surgeon: Dessa Reyes ORN, MD;  Location: [facility] ENDOSCOPY;  Service: Endoscopy;  Laterality: N/A;   CORONARY ANGIOPLASTY     ESOPHAGOGASTRODUODENOSCOPY (EGD) WITH PROPOFOL  N/A 09/13/2015   Procedure: ESOPHAGOGASTRODUODENOSCOPY (EGD) WITH PROPOFOL ;  Surgeon: Gladis RAYMOND Mariner, MD;  Location: [facility] ENDOSCOPY;  Service: Endoscopy;  Laterality: N/A;   ESOPHAGOGASTRODUODENOSCOPY (EGD) WITH PROPOFOL  N/A 12/28/2015   Procedure: ESOPHAGOGASTRODUODENOSCOPY (EGD) WITH PROPOFOL ;  Surgeon: Gladis RAYMOND Mariner, MD;  Location: [facility] ENDOSCOPY;  Service: Endoscopy;  Laterality: N/A;   ESOPHAGOGASTRODUODENOSCOPY  (EGD) WITH PROPOFOL  N/A 06/16/2016   Procedure: ESOPHAGOGASTRODUODENOSCOPY (EGD) WITH PROPOFOL ;  Surgeon: Gladis RAYMOND Mariner, MD;  Location: [facility] ENDOSCOPY;  Service: Endoscopy;  Laterality: N/A;   ESOPHAGOGASTRODUODENOSCOPY (EGD) WITH PROPOFOL  N/A 05/07/2019   Procedure: ESOPHAGOGASTRODUODENOSCOPY (EGD) WITH PROPOFOL ;  Surgeon: Dessa Reyes ORN, MD;  Location: [facility] ENDOSCOPY;  Service: Endoscopy;  Laterality: N/A;   EYE SURGERY  2013   CATARACT EXTRACTION   GANGLION CYST EXCISION Right 05/18/2017   Procedure: REMOVAL GANGLION CYST ANKLE;  Surgeon: Ashley Soulier, DPM;  Location: [facility] ORS;  Service: Podiatry;  Laterality: Right;   heart cath stent     LEFT HEART CATH AND CORONARY ANGIOGRAPHY Right 12/03/2017   Procedure: Left Heart Cath and Coronary Angiography with possible coronary intervention;  Surgeon: Fernand Denyse LABOR, MD;  Location: [facility] INVASIVE CV LAB;  Service: Cardiovascular;  Laterality: Right;   PACEMAKER IMPLANT N/A 04/04/2021   Procedure: PACEMAKER IMPLANT;  Surgeon: Fernande Elspeth BROCKS, MD;  Location: [facility] INVASIVE CV LAB;  Service: Cardiovascular;  Laterality: N/A;   PROSTATE BIOPSY     RIGHT/LEFT HEART CATH AND CORONARY ANGIOGRAPHY N/A 02/07/2021   Procedure: RIGHT/LEFT HEART CATH AND CORONARY ANGIOGRAPHY;  Surgeon: Darron Deatrice LABOR, MD;  Location: [facility] INVASIVE CV LAB;  Service: Cardiovascular;  Laterality: N/A;   stents     multiple    Family History  Problem Relation Age of Onset   Cancer Mother        gastric and lung   Cancer Father        multiple myeloma   Stroke Father    Cancer Sister        leukemia   Cancer Brother        leukemia   Cancer Brother        kidney   Cancer Daughter        Uterine   Cancer Other        Nephes (Sister's Son): Prostate    Social History   Socioeconomic History   Marital status: Married    Spouse name: Glenda   Number of children: 2   Years of education: Not on file   Highest education level: Not on file  Occupational History    Occupation: retired production designer, theatre/television/film for [employer]  Tobacco Use   Smoking status: Former    Current packs/day: 0.00    Average packs/day: 1 pack/day for 35.0 years (35.0 ttl pk-yrs)    Types: Cigarettes    Start date: 03/07/1951    Quit date: 03/06/1986    Years since quitting: 37.0   Smokeless tobacco: Former    Types: Chew    Quit date: 1985  Vaping Use   Vaping status: Never Used  Substance and Sexual Activity   Alcohol use: No   Drug use: No   Sexual activity: Not Currently  Other Topics Concern   Not on file  Social History Narrative   Married, Gaetana 2 step children and 2 children from previous marriage  Social Drivers of Corporate Investment Banker Strain: Low Risk  (10/19/2022)   Overall Financial Resource Strain (CARDIA)    Difficulty of Paying Living Expenses: Not hard at all  Food Insecurity: No Food Insecurity (10/19/2022)   Hunger Vital Sign    Worried About Running Out of Food in the Last Year: Never true    Ran Out of Food in the Last Year: Never true  Transportation Needs: No Transportation Needs (10/19/2022)   PRAPARE - Administrator, Civil Service (Medical): No    Lack of Transportation (Non-Medical): No  Physical Activity: Sufficiently Active (10/19/2022)   Exercise Vital Sign    Days of Exercise per Week: 5 days    Minutes of Exercise per Session: 30 min  Stress: No Stress Concern Present (10/19/2022)   [employer] of Occupational Health - Occupational Stress Questionnaire    Feeling of Stress : Not at all  Social Connections: Moderately Integrated (10/19/2022)   Social Connection and Isolation Panel [NHANES]    Frequency of Communication with Friends and Family: More than three times a week    Frequency of Social Gatherings with Friends and Family: Three times a week    Attends Religious Services: More than 4 times per year    Active Member of Clubs or Organizations: No    Attends Banker Meetings: Never    Marital Status:  Married  Catering Manager Violence: Not At Risk (10/19/2022)   Humiliation, Afraid, Rape, and Kick questionnaire    Fear of Current or Ex-Partner: No    Emotionally Abused: No    Physically Abused: No    Sexually Abused: No     Outpatient Medications Prior to Visit  Medication Sig Dispense Refill   apixaban  (ELIQUIS ) 5 MG TABS tablet Take 1 tablet (5 mg total) by mouth 2 (two) times daily. 180 tablet 3   Calcium  Carb-Cholecalciferol  (CALCIUM  + D3 PO) Take 1 tablet by mouth daily.     cholecalciferol  (VITAMIN D ) 1000 units tablet Take 1,000 Units by mouth daily.      ferrous sulfate 324 MG TBEC Take 324 mg by mouth daily.     furosemide  (LASIX ) 40 MG tablet Take 1 tablet (40 mg total) by mouth every other day. 45 tablet 3   isosorbide  mononitrate (IMDUR ) 30 MG 24 hr tablet Take 1 tablet (30 mg total) by mouth daily. 90 tablet 2   Leuprolide  Acetate, 3 Month, (ELIGARD ) 22.5 MG injection Inject 22.5 mg into the skin every 4 (four) months.     losartan  (COZAAR ) 50 MG tablet Take 1 tablet (50 mg total) by mouth daily. 90 tablet 1   lovastatin  (MEVACOR ) 40 MG tablet Take 2 tablets (80 mg total) by mouth at bedtime. 180 tablet 1   metoprolol  succinate (TOPROL -XL) 25 MG 24 hr tablet TAKE 1 TABLET BY MOUTH ONCE DAILY 90 tablet 1   Multiple Vitamin (MULTI-VITAMINS) TABS Take 1 tablet by mouth daily.      pantoprazole  (PROTONIX ) 40 MG tablet Take 40 mg by mouth 2 (two) times daily.      potassium chloride  (KLOR-CON  M) 10 MEQ tablet Take 1 tablet by mouth every other day with Furosemide . 45 tablet 3   No facility-administered medications prior to visit.    Allergies  Allergen Reactions   Atorvastatin Other (See Comments)    Achy joints    ROS Review of Systems  Respiratory:  Positive for cough, chest tightness and shortness of breath.    Negative unless  indicated in HPI.    Objective:    Physical Exam HENT:     Head:     Salivary Glands: Right salivary gland is not diffusely  enlarged or tender. Left salivary gland is not diffusely enlarged or tender.     Right Ear: Tympanic membrane normal. Tympanic membrane is not erythematous.     Left Ear: Tympanic membrane normal. Tympanic membrane is not erythematous.     Nose:     Right Turbinates: Not enlarged.     Left Turbinates: Not enlarged.     Right Sinus: No maxillary sinus tenderness or frontal sinus tenderness.     Left Sinus: No maxillary sinus tenderness or frontal sinus tenderness.     Mouth/Throat:     Mouth: Mucous membranes are moist.     Pharynx: Postnasal drip present. No pharyngeal swelling, oropharyngeal exudate or posterior oropharyngeal erythema.     Tonsils: No tonsillar exudate.  Cardiovascular:     Rate and Rhythm: Normal rate and regular rhythm.  Pulmonary:     Effort: Pulmonary effort is normal.     Breath sounds: Normal breath sounds. No stridor. No wheezing.  Neurological:     General: No focal deficit present.     Mental Status: He is oriented to person, place, and time. Mental status is at baseline.  Psychiatric:        Mood and Affect: Mood normal.        Behavior: Behavior normal.        Thought Content: Thought content normal.        Judgment: Judgment normal.     BP 118/72   Pulse 81   Temp 98 F (36.7 C)   Wt 216 lb 3.2 oz (98.1 kg)   SpO2 97%   BMI 1055.59 kg/m  Wt Readings from Last 3 Encounters:  03/15/23 216 lb 3.2 oz (98.1 kg)  03/08/23 220 lb 12 oz (100.1 kg)  02/20/23 216 lb (98 kg)     Health Maintenance  Topic Date Due   COVID-19 Vaccine (7 - 2024-25 season) 11/05/2022   Medicare Annual Wellness (AWV)  10/19/2023   Pneumonia Vaccine 66+ Years old  Completed   INFLUENZA VACCINE  Completed   Zoster Vaccines- Shingrix   Completed   HPV VACCINES  Aged Out   DTaP/Tdap/Td  Discontinued    There are no preventive care reminders to display for this patient.  Lab Results  Component Value Date   TSH 2.15 01/25/2022   Lab Results  Component Value Date    WBC 5.2 02/16/2023   HGB 12.7 (L) 02/16/2023   HCT 38.2 (L) 02/16/2023   MCV 91.0 02/16/2023   PLT 184 02/16/2023   Lab Results  Component Value Date   NA 139 02/16/2023   K 4.2 02/16/2023   CO2 26 02/16/2023   GLUCOSE 104 (H) 02/16/2023   BUN 23 02/16/2023   CREATININE 1.33 (H) 02/16/2023   BILITOT 0.5 02/16/2023   ALKPHOS 45 02/16/2023   AST 19 02/16/2023   ALT 17 02/16/2023   PROT 6.7 02/16/2023   ALBUMIN 3.9 02/16/2023   CALCIUM  9.2 02/16/2023   ANIONGAP 8 02/16/2023   EGFR 55 (L) 02/04/2021   GFR 48.17 (L) 07/28/2022   Lab Results  Component Value Date   CHOL 112 07/28/2022   Lab Results  Component Value Date   HDL 28.50 (L) 07/28/2022   Lab Results  Component Value Date   LDLCALC 45 07/28/2022   Lab Results  Component Value Date  TRIG 196.0 (H) 07/28/2022   Lab Results  Component Value Date   CHOLHDL 4 07/28/2022   Lab Results  Component Value Date   HGBA1C 5.8 07/28/2022      Assessment & Plan:  URI with cough and congestion Assessment & Plan: Symptoms started on Tuesday with sneezing, coughing, chest tightness, and sinus drainage. No fever reported. Patient has been using Flonase  and Mucinex  with some relief. . -Start Augmentin  twice daily with food and probiotics to prevent diarrhea. -Continue Flonase  as needed. -Continue Mucinex  twice daily. -Add Allegra once daily at night. -Add Tessalon  Perles as needed for cough. -Increase fluid intake, consider warm tea with honey.    Other cough -     DG Chest 2 View; Future  SOB (shortness of breath) -     DG Chest 2 View; Future  Other orders -     Amoxicillin -Pot Clavulanate; Take 1 tablet by mouth 2 (two) times daily.  Dispense: 20 tablet; Refill: 0 -     Benzonatate ; Take 1 capsule (100 mg total) by mouth 3 (three) times daily as needed.  Dispense: 30 capsule; Refill: 0    Follow-up: Return if symptoms worsen or fail to improve.   Criston Chancellor, NP

## 2023-03-17 ENCOUNTER — Encounter: Payer: Self-pay | Admitting: Nurse Practitioner

## 2023-03-17 DIAGNOSIS — J069 Acute upper respiratory infection, unspecified: Secondary | ICD-10-CM | POA: Insufficient documentation

## 2023-03-17 NOTE — Assessment & Plan Note (Signed)
 Symptoms started on Tuesday with sneezing, coughing, chest tightness, and sinus drainage. No fever reported. Patient has been using Flonase  and Mucinex  with some relief. . -Start Augmentin  twice daily with food and probiotics to prevent diarrhea. -Continue Flonase  as needed. -Continue Mucinex  twice daily. -Add Allegra once daily at night. -Add Tessalon  Perles as needed for cough. -Increase fluid intake, consider warm tea with honey.

## 2023-03-23 ENCOUNTER — Ambulatory Visit
Admission: RE | Admit: 2023-03-23 | Discharge: 2023-03-23 | Disposition: A | Discharge status: Home / Self Care | Payer: Medicare HMO | Source: Ambulatory Visit | Attending: Internal Medicine | Admitting: Internal Medicine

## 2023-03-23 DIAGNOSIS — R911 Solitary pulmonary nodule: Secondary | ICD-10-CM

## 2023-03-30 ENCOUNTER — Telehealth: Payer: Self-pay | Admitting: Internal Medicine

## 2023-03-30 NOTE — Telephone Encounter (Signed)
Patient wife would like a call back regarding his ct scan of the lungs patient never got any results back from the office regarding this

## 2023-04-02 NOTE — Telephone Encounter (Signed)
See result note.

## 2023-04-04 ENCOUNTER — Other Ambulatory Visit: Payer: Self-pay | Admitting: Internal Medicine

## 2023-04-04 DIAGNOSIS — R9389 Abnormal findings on diagnostic imaging of other specified body structures: Secondary | ICD-10-CM

## 2023-04-04 NOTE — Progress Notes (Signed)
Order placed for pulmonary referral.

## 2023-04-09 ENCOUNTER — Other Ambulatory Visit (HOSPITAL_COMMUNITY): Payer: Self-pay

## 2023-04-09 ENCOUNTER — Encounter: Payer: Self-pay | Admitting: Internal Medicine

## 2023-04-09 ENCOUNTER — Telehealth: Payer: Self-pay | Admitting: Pharmacy Technician

## 2023-04-09 NOTE — Telephone Encounter (Signed)
Pharmacy Patient Advocate Encounter  Received notification from [facility] that Prior Authorization for Benzonatate 100MG  capsules has been CANCELLED due to:     Key: [id number]

## 2023-04-10 ENCOUNTER — Ambulatory Visit (INDEPENDENT_AMBULATORY_CARE_PROVIDER_SITE_OTHER): Payer: Medicare HMO

## 2023-04-10 DIAGNOSIS — I495 Sick sinus syndrome: Secondary | ICD-10-CM

## 2023-04-11 LAB — CUP PACEART REMOTE DEVICE CHECK
Battery Remaining Longevity: 137 mo
Battery Voltage: 3.03 V
Brady Statistic AP VP Percent: 0.09 %
Brady Statistic AP VS Percent: 86.95 %
Brady Statistic AS VP Percent: 0.04 %
Brady Statistic AS VS Percent: 12.91 %
Brady Statistic RA Percent Paced: 88.21 %
Brady Statistic RV Percent Paced: 0.14 %
Date Time Interrogation Session: 20250204001842
Implantable Lead Connection Status: 753985
Implantable Lead Connection Status: 753985
Implantable Lead Implant Date: 20230130
Implantable Lead Implant Date: 20230130
Implantable Lead Location: 753859
Implantable Lead Location: 753860
Implantable Lead Model: 3830
Implantable Lead Model: 5076
Implantable Pulse Generator Implant Date: 20230130
Lead Channel Impedance Value: 323 Ohm
Lead Channel Impedance Value: 418 Ohm
Lead Channel Impedance Value: 456 Ohm
Lead Channel Impedance Value: 570 Ohm
Lead Channel Pacing Threshold Amplitude: 0.75 V
Lead Channel Pacing Threshold Amplitude: 0.875 V
Lead Channel Pacing Threshold Pulse Width: 0.4 ms
Lead Channel Pacing Threshold Pulse Width: 0.4 ms
Lead Channel Sensing Intrinsic Amplitude: 1.625 mV
Lead Channel Sensing Intrinsic Amplitude: 1.625 mV
Lead Channel Sensing Intrinsic Amplitude: 2.375 mV
Lead Channel Sensing Intrinsic Amplitude: 2.375 mV
Lead Channel Setting Pacing Amplitude: 1.75 V
Lead Channel Setting Pacing Amplitude: 2 V
Lead Channel Setting Pacing Pulse Width: 0.4 ms
Lead Channel Setting Sensing Sensitivity: 0.9 mV
Zone Setting Status: 755011

## 2023-04-13 ENCOUNTER — Telehealth: Payer: Self-pay | Admitting: Cardiovascular Disease

## 2023-04-13 NOTE — Telephone Encounter (Signed)
 STAT if patient feels like he/she is going to faint   Are you dizzy now?  No, patient is doing fine this morning per wife  Do you feel faint or have you passed out?  Patient hasn't passed out but he has had symptoms similar to when he had a stroke. Patient has had 3 recent episodes, occurring Wednesday night and last night. Wife states patients eyes were glassy and blank, he hand tingling in his right hand and arm, and no slurred speech. She states after it occurs patient feels weak and drained, but he sleeps fine with Cpap. Patient woke up early and is doing fine this morning but wife is concerned because patient had similar symptoms when he had a stroke. Please advise.  Do you have any other symptoms?    Have you checked your HR and BP (record if available)?  2/05: 118/?? 2/06: 130/72 74

## 2023-04-13 NOTE — Telephone Encounter (Signed)
 Returned the call to the patient's wife. She was calling concerned that the patient had 2 episodes where he felt like he was going to pass out.   On 2/5, the patient was at church and suddenly his eyes glassed over and he felt like he would pass out (but did not). Blood pressure when he got home was 130/72.  On 2/6, the patient had another episode where his eyes glassed over and he felt weak. He did not pass out.   He did not have slurred speech or syncope during these episodes but they did make him feel weak.   The patient refused to go to the ED during these episodes. The wife stated that they lasted a few minutes.   The patient stated that he felt fine this morning. He [facility] the [facility] center [facility] and walks a few laps but otherwise, per the patient's wife, he is sedentary.  The patient has a follow up with Dr. Darron on 2/14.   The patient will be going back to see his neurologist in March.  Message has been sent to the device clinic to see if anything was abnormal.

## 2023-04-13 NOTE — Telephone Encounter (Signed)
 Called and spoke with wife per DPR. Notified her of the following from Dr. Alvenia Aus.  Okay, we will see him next week.  If things get worse, he should go to the ED.   Wife verbalizes understanding.

## 2023-04-13 NOTE — Telephone Encounter (Signed)
 Okay, we will see him next week.  If things get worse, he should go to the ED.

## 2023-04-13 NOTE — Telephone Encounter (Signed)
 I spoke with the pt wife and helped her send a manual transmission. Transmission received. I told her the nurse will review it and give her a call back.

## 2023-04-13 NOTE — Telephone Encounter (Signed)
 Transmission reviewed. Everything WNL and no explanation, related to electrophysiology, on device.

## 2023-04-13 NOTE — Telephone Encounter (Signed)
 Spoke w/ wife. @ lunch. Will call back when they get home to send manual transmission. Has medtronic relay.

## 2023-04-13 NOTE — Telephone Encounter (Signed)
 Reviewed transmission. Normal Device function, no episodes/abnormalities recorded.   Given symptoms, suspicious for CVA/TIA activity instructed to go to ER, call 911 if having symptoms.  Patient is refusing.  Forwarding to Dr. Alvenia Aus and Triage team.

## 2023-04-17 ENCOUNTER — Other Ambulatory Visit: Payer: Self-pay | Admitting: Cardiovascular Disease

## 2023-04-20 ENCOUNTER — Encounter: Payer: Self-pay | Admitting: Cardiovascular Disease

## 2023-04-20 ENCOUNTER — Telehealth: Payer: Self-pay

## 2023-04-20 ENCOUNTER — Ambulatory Visit: Payer: Medicare HMO | Attending: Cardiovascular Disease | Admitting: Cardiovascular Disease

## 2023-04-20 VITALS — BP 110/68 | HR 77 | Wt 215.2 lb

## 2023-04-20 DIAGNOSIS — I2510 Atherosclerotic heart disease of native coronary artery without angina pectoris: Secondary | ICD-10-CM | POA: Diagnosis not present

## 2023-04-20 DIAGNOSIS — I10 Essential (primary) hypertension: Secondary | ICD-10-CM | POA: Diagnosis not present

## 2023-04-20 DIAGNOSIS — E785 Hyperlipidemia, unspecified: Secondary | ICD-10-CM

## 2023-04-20 DIAGNOSIS — R001 Bradycardia, unspecified: Secondary | ICD-10-CM

## 2023-04-20 DIAGNOSIS — I480 Paroxysmal atrial fibrillation: Secondary | ICD-10-CM

## 2023-04-20 MED ORDER — LOSARTAN POTASSIUM 25 MG PO TABS: 25.0000 mg | ORAL_TABLET | Freq: Every day | ORAL | 3 refills | Status: DC

## 2023-04-20 NOTE — Progress Notes (Signed)
Cardiology Office Note   Date:  04/20/2023   ID:  Nathaniel Thompson, DOB 02-09-1936, MRN [id number]  PCP:  Dale Ward, MD  Cardiologist:   Lorine Bears, MD   Chief Complaint  Patient presents with   Follow-up    6 month follow up. Patient c/o sob and some intermittent dizziness and blurred vision. Medications reviewed.        History of Present Illness: Nathaniel Thompson is a 88 y.o. male who presents for a follow-up visit regarding coronary artery disease and history of pacemaker. He has chronic medical conditions including TIA, previous tobacco use, metastatic prostate cancer, small abdominal aortic aneurysm followed by vascular surgery, chronic kidney disease, hyperlipidemia and essential hypertension.  He has known history of coronary artery disease status post stenting of the left circumflex and RCA.  He had initial PCI done in 2003 at [facility].  He had repeat PCI x2 at [facility] in 2004.   He was hospitalized in September 2019 with questionable tachycardia and atrial fibrillation.  Initially was placed on amiodarone and anticoagulation but all his EKGs revealed sinus rhythm at that time and thus both amiodarone and Eliquis were discontinued.     Echocardiogram in March 2020 showed normal LV systolic function with grade 1 diastolic dysfunction, severely dilated left atrium, mild mitral and tricuspid regurgitation and mild pulmonary hypertension.  He had worsening chest pain and shortness of breath in December 2022.  Cardiac catheterization showed patent stents in the left circumflex and right coronary artery with no obstructive coronary artery disease.  Right heart catheterization showed mildly elevated filling pressures, mild pulmonary hypertension and normal cardiac output.  The patient was noted to have intermittent bradycardia during cardiac catheterization with heart rate going to the low 40s.    He underwent outpatient monitor which showed frequent episodes of SVT.  He was  referred to EP and underwent pacemaker placement in January 2023.  In addition, he was placed on furosemide for diastolic heart failure.  He fell from a deer stand in late 2023 and fractured his left posterior calcaneus.  He was subsequently hospitalized at [facility]  with shortness of breath, cough and fever in the setting of his  displaced fracture of left posterior calcaneus.  He was found to have RSV and lobar and segmental pulmonary embolism.    He was treated with anticoagulation.  He was noted to have episodes of tachycardia and was found to be in atrial fibrillation with rapid ventricular response.  This lasted for about 45 minutes.  He was placed on small dose metoprolol.    He was hospitalized at [facility] in December with slurred speech, right arm weakness and suspected stroke.  tPA was not given given that he is on Eliquis.  He was hypertensive on presentation.  MRI of the brain showed no acute stroke.  Echocardiogram showed normal LV systolic function with no significant valvular abnormalities.  The patient was thought to have TIA.  He had episodes of dizziness, vertigo and decreased responsiveness since then.  These episodes usually last 10 to 15 minutes.  No chest pain.  He does have exertional dyspnea but that has been chronic.   Past Medical History:  Diagnosis Date   (HFpEF) heart failure with preserved ejection fraction (HCC)    a. 05/2018 Echo: Nl EF, Gr1 DD, sev dil LA, mild MR/TR, mild PAH; b. 05/2019 Echo: EF 55-60%, no rwma, mild LVH. Nl PASP. Mildly dil LA. Triv MR. Mild AoV sclerosis w/o stenosis.  Mildly dil Asc AO (39mm).   Allergic rhinitis    Barrett's esophagus 10/21/2013   Bone cancer (HCC)    CKD (chronic kidney disease), stage III (HCC)    Colon polyp, hyperplastic    COPD (chronic obstructive pulmonary disease) (HCC)    mild COPD. former smoker   Coronary artery disease    a. 2003 s/p PCI ([facility]); b. 2004 s/p PCI x 2 ([facility]); c. 11/2017 Cath: LM nl, LAD mild dzs, LCX patent  stent w/ 30% distal edge restenosis, RCA patent distal stent w/ 30% prox edge stenosis. Nl EF->Med Rx;   Diverticulosis    Fundic gland polyps of stomach, benign    Gastritis    GERD (gastroesophageal reflux disease)    Hypercholesteremia    Hypertension    Prostate cancer (HCC)    Prostatism    Reflux esophagitis    Renal stones    Skin cancer    left cheek/lesion excised   Sleep apnea    Tachycardia    a. 11/2017 admit w/ tachycardia/? afib-->no afib noted upon review (amio/OAC d/c'd).   TIA (transient ischemic attack)     Past Surgical History:  Procedure Laterality Date   CARDIAC CATHETERIZATION     COLONOSCOPY WITH PROPOFOL N/A 09/13/2015   Procedure: COLONOSCOPY WITH PROPOFOL;  Surgeon: Christena Deem, MD;  Location: [facility] ENDOSCOPY;  Service: Endoscopy;  Laterality: N/A;   COLONOSCOPY WITH PROPOFOL N/A 05/07/2019   Procedure: COLONOSCOPY WITH PROPOFOL;  Surgeon: Earline Mayotte, MD;  Location: [facility] ENDOSCOPY;  Service: Endoscopy;  Laterality: N/A;   CORONARY ANGIOPLASTY     ESOPHAGOGASTRODUODENOSCOPY (EGD) WITH PROPOFOL N/A 09/13/2015   Procedure: ESOPHAGOGASTRODUODENOSCOPY (EGD) WITH PROPOFOL;  Surgeon: Christena Deem, MD;  Location: [facility] ENDOSCOPY;  Service: Endoscopy;  Laterality: N/A;   ESOPHAGOGASTRODUODENOSCOPY (EGD) WITH PROPOFOL N/A 12/28/2015   Procedure: ESOPHAGOGASTRODUODENOSCOPY (EGD) WITH PROPOFOL;  Surgeon: Christena Deem, MD;  Location: [facility] ENDOSCOPY;  Service: Endoscopy;  Laterality: N/A;   ESOPHAGOGASTRODUODENOSCOPY (EGD) WITH PROPOFOL N/A 06/16/2016   Procedure: ESOPHAGOGASTRODUODENOSCOPY (EGD) WITH PROPOFOL;  Surgeon: Christena Deem, MD;  Location: [facility] ENDOSCOPY;  Service: Endoscopy;  Laterality: N/A;   ESOPHAGOGASTRODUODENOSCOPY (EGD) WITH PROPOFOL N/A 05/07/2019   Procedure: ESOPHAGOGASTRODUODENOSCOPY (EGD) WITH PROPOFOL;  Surgeon: Earline Mayotte, MD;  Location: [facility] ENDOSCOPY;  Service: Endoscopy;  Laterality: N/A;   EYE SURGERY  2013    CATARACT EXTRACTION   GANGLION CYST EXCISION Right 05/18/2017   Procedure: REMOVAL GANGLION CYST ANKLE;  Surgeon: Gwyneth Revels, DPM;  Location: [facility] ORS;  Service: Podiatry;  Laterality: Right;   heart cath stent     LEFT HEART CATH AND CORONARY ANGIOGRAPHY Right 12/03/2017   Procedure: Left Heart Cath and Coronary Angiography with possible coronary intervention;  Surgeon: Laurier Nancy, MD;  Location: [facility] INVASIVE CV LAB;  Service: Cardiovascular;  Laterality: Right;   PACEMAKER IMPLANT N/A 04/04/2021   Procedure: PACEMAKER IMPLANT;  Surgeon: [facility] Salvia, MD;  Location: [facility] INVASIVE CV LAB;  Service: Cardiovascular;  Laterality: N/A;   PROSTATE BIOPSY     RIGHT/LEFT HEART CATH AND CORONARY ANGIOGRAPHY N/A 02/07/2021   Procedure: RIGHT/LEFT HEART CATH AND CORONARY ANGIOGRAPHY;  Surgeon: Iran Ouch, MD;  Location: [facility] INVASIVE CV LAB;  Service: Cardiovascular;  Laterality: N/A;   stents     multiple     Current Outpatient Medications  Medication Sig Dispense Refill   apixaban (ELIQUIS) 5 MG TABS tablet Take 1 tablet (5 mg total) by mouth 2 (two) times daily. 180 tablet 3  Calcium Carb-Cholecalciferol (CALCIUM + D3 PO) Take 1 tablet by mouth daily.     cholecalciferol (VITAMIN D) 1000 units tablet Take 1,000 Units by mouth daily.      ferrous sulfate 324 MG TBEC Take 324 mg by mouth daily.     furosemide (LASIX) 40 MG tablet Take 1 tablet (40 mg total) by mouth every other day. 45 tablet 3   isosorbide mononitrate (IMDUR) 30 MG 24 hr tablet Take 1 tablet (30 mg total) by mouth daily. 90 tablet 2   losartan (COZAAR) 50 MG tablet TAKE 1 TABLET BY MOUTH ONCE DAILY 90 tablet 0   lovastatin (MEVACOR) 40 MG tablet Take 2 tablets (80 mg total) by mouth at bedtime. 180 tablet 1   metoprolol succinate (TOPROL-XL) 25 MG 24 hr tablet TAKE 1 TABLET BY MOUTH ONCE DAILY 90 tablet 1   Multiple Vitamin (MULTI-VITAMINS) TABS Take 1 tablet by mouth daily.      pantoprazole (PROTONIX) 40 MG  tablet Take 40 mg by mouth 2 (two) times daily.      potassium chloride (KLOR-CON M) 10 MEQ tablet Take 1 tablet by mouth every other day with Furosemide. 45 tablet 3   Leuprolide Acetate, 3 Month, (ELIGARD) 22.5 MG injection Inject 22.5 mg into the skin every 4 (four) months. (Patient not taking: Reported on 04/20/2023)     No current facility-administered medications for this visit.    Allergies:   Atorvastatin    Social History:  The patient  reports that he quit smoking about 37 years ago. His smoking use included cigarettes. He started smoking about 72 years ago. He has a 35 pack-year smoking history. He quit smokeless tobacco use about 40 years ago.  His smokeless tobacco use included chew. He reports that he does not drink alcohol and does not use drugs.   Family History:  The patient's family history includes Cancer in his brother, brother, daughter, father, mother, sister, and another family member; Stroke in his father.    ROS:  Please see the history of present illness.   Otherwise, review of systems are positive for none.   All other systems are reviewed and negative.    PHYSICAL EXAM: VS:  BP 110/68 (BP Location: Left Arm, Patient Position: Sitting, Cuff Size: Normal)   Pulse 77   Wt 215 lb 3.2 oz (97.6 kg)   SpO2 94%   BMI 1050.71 kg/m  , BMI Body mass index is 1,050.71 kg/m. GEN: Well nourished, well developed, in no acute distress  HEENT: normal  Neck: no JVD, carotid bruits, or masses Cardiac: RRR; no murmurs, rubs, or gallops, mild bilateral lower extremity edema Respiratory:  clear to auscultation bilaterally, normal work of breathing GI: soft, nontender, nondistended, + BS MS: no deformity or atrophy  Skin: warm and dry, no rash Neuro:  Strength and sensation are intact Psych: euthymic mood, full affect   EKG:  EKG is ordered today. The ekg ordered today demonstrates : Atrial-paced rhythm When compared with ECG of 16-Feb-2023 15:42, No significant change  was found    Recent Labs: 02/16/2023: ALT 17; BUN 23; Creatinine, Ser 1.33; Hemoglobin 12.7; Platelets 184; Potassium 4.2; Sodium 139    Lipid Panel    Component Value Date/Time   CHOL 112 07/28/2022 0744   CHOL 103 09/16/2013 0438   TRIG 196.0 (H) 07/28/2022 0744   TRIG 95 09/16/2013 0438   HDL 28.50 (L) 07/28/2022 0744   HDL 30 (L) 09/16/2013 0438   CHOLHDL 4 07/28/2022 0744  VLDL 39.2 07/28/2022 0744   VLDL 19 09/16/2013 0438   LDLCALC 45 07/28/2022 0744   LDLCALC 54 09/16/2013 0438      Wt Readings from Last 3 Encounters:  04/20/23 215 lb 3.2 oz (97.6 kg)  03/15/23 216 lb 3.2 oz (98.1 kg)  03/08/23 220 lb 12 oz (100.1 kg)          04/15/2019    2:58 PM  PAD Screen  Previous PAD dx? No  Previous surgical procedure? No  Pain with walking? No  Feet/toe relief with dangling? No  Painful, non-healing ulcers? No  Extremities discolored? No      ASSESSMENT AND PLAN:  1.  Coronary artery disease involving native coronary arteries without angina: Cardiac catheterization in December of 2022 showed patent stents with no obstructive disease.  No antiplatelet medications given that he is on Eliquis.  2.  Chronic diastolic heart failure: He seems to be euvolemic on furosemide 40 mg every other day.  3.  Essential hypertension: Amlodipine was discontinued during last visit due to low blood pressure.  His blood pressure continues to be on the low side which might be contributing to some of his neurologic symptoms.  I decreased losartan to 25 mg daily.  4.  Hyperlipidemia:  History of intolerance to potent statins.  He tolerates lovastatin 40 mg daily.  I reviewed most recent lipid profile which showed an LDL of 45.  5.  Small abdominal aortic aneurysm: Less than 4 cm.  Followed by vascular surgery.  This has been stable.  In addition, he has mild nonobstructive carotid disease.  6.  Symptomatic bradycardia: Status post permanent pacemaker placement.  The pacemaker seems  to be functioning normally.  7.  Paroxysmal atrial fibrillation: Continue anticoagulation with Eliquis.  8.  Episodes of vertigo and decreased responsiveness: Do not seem to be cardiac in origin.  Pacemaker interrogation was unremarkable.  He has a follow-up appointment with neurology in March.  I wonder if he needs EEG.    Disposition: Follow-up in 6 months.   Signed,  Lorine Bears, MD  04/20/2023 9:10 AM    [facility]

## 2023-04-20 NOTE — Telephone Encounter (Signed)
Advised that we cannot have labs done at the cancer center anymore. Pt  did not want to get stuck twice in the same daY. Lab appointment has been moved.

## 2023-04-20 NOTE — Telephone Encounter (Signed)
Copied from CRM [id number]. Topic: Clinical - Request for Lab/Test Order >> Apr 20, 2023  2:28 PM Adele Barthel wrote: Reason for CRM: Patient's wife Rivka Barbara is contacting office concerning labwork ordered by provider. She advised patient is having bloodwork performed through the Cancer Center the same day he is scheduled to have labs done for clinic (03/11). She would like to know if his labwork from Dr. Lorin Picket can be added to the order for labwork at the [facility]. She has spoken with the Cancer Center and they have not been flexible in adding the additional labwork and is requesting if Dr. Lorin Picket can contact them to include her ordered bloodwork.   CB# [phone]

## 2023-04-20 NOTE — Patient Instructions (Signed)
Medication Instructions:  DECREASE the Losartan to 25 mg once daily  *If you need a refill on your cardiac medications before your next appointment, please call your pharmacy*   Lab Work: None ordered If you have labs (blood work) drawn today and your tests are completely normal, you will receive your results only by: MyChart Message (if you have MyChart) OR A paper copy in the mail If you have any lab test that is abnormal or we need to change your treatment, we will call you to review the results.   Testing/Procedures: None ordered   Follow-Up: At [facility], you and your health needs are our priority.  As part of our continuing mission to provide you with exceptional heart care, we have created designated Provider Care Teams.  These Care Teams include your primary Cardiologist (physician) and Advanced Practice Providers (APPs -  Physician Assistants and Nurse Practitioners) who all work together to provide you with the care you need, when you need it.  We recommend signing up for the patient portal called "MyChart".  Sign up information is provided on this After Visit Summary.  MyChart is used to connect with patients for Virtual Visits (Telemedicine).  Patients are able to view lab/test results, encounter notes, upcoming appointments, etc.  Non-urgent messages can be sent to your provider as well.   To learn more about what you can do with MyChart, go to [email or web address].    Your next appointment:   6 month(s)  Provider:   You may see Lorine Bears, MD or one of the following Advanced Practice Providers on your designated Care Team:   Nicolasa Ducking, NP Eula Listen, PA-C Cadence Fransico Michael, PA-C Charlsie Quest, NP Carlos Levering, NP

## 2023-04-25 ENCOUNTER — Institutional Professional Consult (permissible substitution): Payer: Medicare HMO | Admitting: Student in an Organized Health Care Education/Training Program

## 2023-05-01 ENCOUNTER — Ambulatory Visit: Payer: Medicare HMO | Admitting: Pulmonary Disease

## 2023-05-01 ENCOUNTER — Encounter: Payer: Self-pay | Admitting: Pulmonary Disease

## 2023-05-01 ENCOUNTER — Other Ambulatory Visit: Payer: Self-pay | Admitting: Internal Medicine

## 2023-05-01 VITALS — BP 118/78 | HR 77 | Temp 96.9°F | Ht 70.0 in | Wt 219.0 lb

## 2023-05-01 DIAGNOSIS — J8410 Pulmonary fibrosis, unspecified: Secondary | ICD-10-CM | POA: Diagnosis not present

## 2023-05-01 DIAGNOSIS — Z87891 Personal history of nicotine dependence: Secondary | ICD-10-CM

## 2023-05-01 DIAGNOSIS — G4733 Obstructive sleep apnea (adult) (pediatric): Secondary | ICD-10-CM | POA: Diagnosis not present

## 2023-05-01 DIAGNOSIS — Z950 Presence of cardiac pacemaker: Secondary | ICD-10-CM

## 2023-05-01 DIAGNOSIS — I2510 Atherosclerotic heart disease of native coronary artery without angina pectoris: Secondary | ICD-10-CM

## 2023-05-01 NOTE — Progress Notes (Signed)
 Subjective:    Patient ID: Nathaniel Thompson, male    DOB: January 08, 1936, 88 y.o.   MRN: [id number]  Patient Care Team: Dale [place], MD as PCP - General (Internal Medicine) Iran Ouch, MD as PCP - Cardiology (Cardiology) Iran Ouch, MD as Consulting Physician (Cardiology) Earna Coder, MD as Consulting Physician (Hematology and Oncology) Gilda Crease, Latina Craver, MD (Vascular Surgery) Louellen Molder, NP as Nurse Practitioner (Gastroenterology) Salena Saner, MD as Consulting Physician (Pulmonary Disease)  Chief Complaint  Patient presents with   Consult    DOE. No wheezing. No cough.     BACKGROUND: Patient is an 88 year old with prior history of obstructive sleep apnea and chronic bronchitis previously evaluated here by Dr. Shane Crutch for dyspnea on exertion and sleep apnea, last seen 19 September 2018.  Patient is kindly referred by Dr. Dale Manchester for evaluation of a lung nodule.   HPI Discussed the use of AI scribe software for clinical note transcription with the patient, who gave verbal consent to proceed.  History of Present Illness   [name] is an 88 year old male with coronary artery disease who presents for evaluation of a lung nodule. He was referred by Dr. Lorin Picket for evaluation of a lung nodule.  He has concerns about a lung nodule identified by Dr. Lorin Picket. He recalls a history of pneumonia in 1966, during which a small nodule was noted on one of his lungs. Follow-up evaluations indicated that the nausea had not grown over the years. No current problems with shortness of breath unless he overexerts himself, he has not had change in the quality of his dyspnea over the years.  He has a significant cardiac history, including a ICD/pacemaker placement two years ago and multiple stent placements in 2003 and 2005. No current issues with shortness of breath unless overexerted, this is not new.  He has a history of smoking but quit  in either 1988 or 1989. He does not have COPD.  He has a history of sleep apnea and uses a CPAP mask.  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 He was an active Product manager and sustained a bone fracture in his heel during a hunting accident. He wears upper dentures.       Prior data documented on Dr. Carolyn Stare visits: **Desat walk 09/19/18>> at rest on RA, sat was 96% and HR 55, walked 360 feet at fast pace with sat 96% and HR 85. Moderate dyspnea.  **CPAP download 07/10/2018-08/08/2018>> uses greater than 4 hours is 28/30 days.  Average usage on days used is 5 hours 36 minutes.  Pressure ranges 10-20.  Median pressure 12, 95th percentile pressure 15, maximum pressure 17.  Leaks are within normal limits.  Residual AHI is 4.9.  Overall this shows very good compliance with with CPAP and excellent control obstructive sleep apnea. **PFT 09/16/2018>> tracings personally reviewed.  FVC is 98% predicted, FEV1 is 104% predicted, ratio is 76%.  There is no significant improvement with bronchodilator.  TLC is 94% predicted.  Ratio is within normal limits.  Flow volume loop is unremarkable.  DLCO 62% predicted.  Overall this shows normal pulmonary function without evidence of significant COPD/emphysema. **Chest x-ray 04/30/2018>> imaging personally reviewed, mild changes of chronic bronchitis, lungs are otherwise unremarkable.  Nodule in the right upper lung zone. **CPAP download 03/31/2018-04/29/2018>> raw data personally reviewed.  Usage greater than 4 hours  is 20/30 days.  Average usage on days used is 5 hours 11 minutes.  Pressure ranges 5-20.  Median pressure 13, 95th percentile pressure 16, max pressure 17.5.  Leaks are within normal limits.  Residual AHI is 4.  Overall this shows inadequate compliance with CPAP with good control of obstructive sleep apnea when used. **Left heart cath 12/03/2017>> mild stable coronary  artery disease, **CPAP titration 12/24/2009>> No significant CAD with normal LVEF. **Review of old records, previous sleep study 12/02/2009.  AHI of 20, consistent with moderate obstructive sleep apnea.  CPAP was recommended.   DATA 02/12/2023 echocardiogram [facility]): LVEF over 55%, grade 1 DD.  RV appears normal, mild mitral regurgitation, mild pulmonic regurgitation 03/23/2023 CT chest without contrast: No CT evidence of acute intrathoracic abnormality, very mild subpleural reticulation consistent with fibrosis.  Calcified granuloma in the right upper lobe for which no additional imaging follow-up is recommended, cardiomegaly.  Review of Systems A 10 point review of systems was performed and it is as noted above otherwise negative.   Past Medical History:  Diagnosis Date   (HFpEF) heart failure with preserved ejection fraction (HCC)    a. 05/2018 Echo: Nl EF, Gr1 DD, sev dil LA, mild MR/TR, mild PAH; b. 05/2019 Echo: EF 55-60%, no rwma, mild LVH. Nl PASP. Mildly dil LA. Triv MR. Mild AoV sclerosis w/o stenosis. Mildly dil Asc AO (39mm).   Allergic rhinitis    Barrett's esophagus 10/21/2013   Bone cancer (HCC)    CKD (chronic kidney disease), stage III (HCC)    Colon polyp, hyperplastic    COPD (chronic obstructive pulmonary disease) (HCC)    mild COPD. former smoker   Coronary artery disease    a. 2003 s/p PCI ([facility]); b. 2004 s/p PCI x 2 ([facility]); c. 11/2017 Cath: LM nl, LAD mild dzs, LCX patent stent w/ 30% distal edge restenosis, RCA patent distal stent w/ 30% prox edge stenosis. Nl EF->Med Rx;   Diverticulosis    Fundic gland polyps of stomach, benign    Gastritis    GERD (gastroesophageal reflux disease)    Hypercholesteremia    Hypertension    Prostate cancer (HCC)    Prostatism    Reflux esophagitis    Renal stones    Skin cancer    left cheek/lesion excised   Sleep apnea    Tachycardia    a. 11/2017 admit w/ tachycardia/? afib-->no afib noted upon review (amio/OAC d/c'd).   TIA  (transient ischemic attack)     Past Surgical History:  Procedure Laterality Date   CARDIAC CATHETERIZATION     COLONOSCOPY WITH PROPOFOL N/A 09/13/2015   Procedure: COLONOSCOPY WITH PROPOFOL;  Surgeon: Christena Deem, MD;  Location: [facility] ENDOSCOPY;  Service: Endoscopy;  Laterality: N/A;   COLONOSCOPY WITH PROPOFOL N/A 05/07/2019   Procedure: COLONOSCOPY WITH PROPOFOL;  Surgeon: Earline Mayotte, MD;  Location: [facility] ENDOSCOPY;  Service: Endoscopy;  Laterality: N/A;   CORONARY ANGIOPLASTY     ESOPHAGOGASTRODUODENOSCOPY (EGD) WITH PROPOFOL N/A 09/13/2015   Procedure: ESOPHAGOGASTRODUODENOSCOPY (EGD) WITH PROPOFOL;  Surgeon: Christena Deem, MD;  Location: [facility] ENDOSCOPY;  Service: Endoscopy;  Laterality: N/A;   ESOPHAGOGASTRODUODENOSCOPY (EGD) WITH PROPOFOL N/A 12/28/2015   Procedure: ESOPHAGOGASTRODUODENOSCOPY (EGD) WITH PROPOFOL;  Surgeon: Christena Deem, MD;  Location: [facility] ENDOSCOPY;  Service: Endoscopy;  Laterality: N/A;   ESOPHAGOGASTRODUODENOSCOPY (EGD) WITH PROPOFOL N/A 06/16/2016   Procedure: ESOPHAGOGASTRODUODENOSCOPY (EGD) WITH PROPOFOL;  Surgeon: Christena Deem, MD;  Location: [facility] ENDOSCOPY;  Service: Endoscopy;  Laterality: N/A;  ESOPHAGOGASTRODUODENOSCOPY (EGD) WITH PROPOFOL N/A 05/07/2019   Procedure: ESOPHAGOGASTRODUODENOSCOPY (EGD) WITH PROPOFOL;  Surgeon: Earline Mayotte, MD;  Location: [facility] ENDOSCOPY;  Service: Endoscopy;  Laterality: N/A;   EYE SURGERY  2013   CATARACT EXTRACTION   GANGLION CYST EXCISION Right 05/18/2017   Procedure: REMOVAL GANGLION CYST ANKLE;  Surgeon: Gwyneth Revels, DPM;  Location: [facility] ORS;  Service: Podiatry;  Laterality: Right;   heart cath stent     LEFT HEART CATH AND CORONARY ANGIOGRAPHY Right 12/03/2017   Procedure: Left Heart Cath and Coronary Angiography with possible coronary intervention;  Surgeon: Laurier Nancy, MD;  Location: [facility] INVASIVE CV LAB;  Service: Cardiovascular;  Laterality: Right;   PACEMAKER IMPLANT N/A 04/04/2021    Procedure: PACEMAKER IMPLANT;  Surgeon: [facility] Salvia, MD;  Location: [facility] INVASIVE CV LAB;  Service: Cardiovascular;  Laterality: N/A;   PROSTATE BIOPSY     RIGHT/LEFT HEART CATH AND CORONARY ANGIOGRAPHY N/A 02/07/2021   Procedure: RIGHT/LEFT HEART CATH AND CORONARY ANGIOGRAPHY;  Surgeon: Iran Ouch, MD;  Location: [facility] INVASIVE CV LAB;  Service: Cardiovascular;  Laterality: N/A;   stents     multiple    Patient Active Problem List   Diagnosis Date Noted   URI with cough and congestion 03/17/2023   Lung nodule 03/11/2023   Paresthesia of arm 02/17/2023   Healthcare maintenance 08/01/2022   Sinus bradycardia 05/16/2022   VT (ventricular tachycardia) (HCC) - non-sustained 05/16/2022   Pacemaker - MDT 05/16/2022   RSV (acute bronchiolitis due to respiratory syncytial virus) 02/04/2022   Cough 01/07/2022   Primary osteoarthritis of left knee 11/11/2021   Urinary urgency 09/27/2021   Shortness of breath    Unstable angina (HCC)    Chronic heart failure with preserved ejection fraction (HCC) 06/23/2020   Foot pain 07/16/2019   Nasal congestion 07/16/2019   B12 deficiency 04/06/2019   Iron deficiency anemia due to chronic blood loss 03/31/2019   GERD without esophagitis 12/30/2018   History of coronary artery disease 12/30/2018   Popliteal artery aneurysm (HCC) 12/30/2018   Abdominal aortic aneurysm (AAA) (HCC) 12/07/2018   Anemia 12/07/2018   Right hip pain 11/22/2018   Atrial fibrillation (HCC) 11/27/2017   Dizziness 08/12/2017   Nasal erythema 04/10/2017   Carotid artery disease (HCC) 08/06/2016   History of TIA (transient ischemic attack) 07/24/2016   Sleep apnea 07/24/2016   COPD (chronic obstructive pulmonary disease) (HCC) 07/24/2016   CKD (chronic kidney disease) stage 3, GFR 30-59 ml/min (HCC) 07/24/2016   Barrett's esophagus 07/24/2016   Hyperglycemia 07/24/2016   Hypercholesterolemia 07/12/2016   Hypertension, essential 07/12/2016   Coronary artery disease  involving native heart with angina pectoris (HCC) 07/12/2016   Prostate cancer (HCC) 11/15/2015   Family history of cancer 11/15/2015   Goals of care, counseling/discussion 06/18/2014   Acute rhinitis 02/12/2014   S/P coronary artery stent placement 10/02/2013   History of skin cancer 09/02/2013    Family History  Problem Relation Age of Onset   Cancer Mother        gastric and lung   Cancer Father        multiple myeloma   Stroke Father    Cancer Sister        leukemia   Cancer Brother        leukemia   Cancer Brother        kidney   Cancer Daughter        Uterine   Cancer Other  Nephes (Sister's Son): Prostate    Social History   Tobacco Use   Smoking status: Former    Current packs/day: 0.00    Average packs/day: 1 pack/day for 35.0 years (35.0 ttl pk-yrs)    Types: Cigarettes    Start date: 03/07/1951    Quit date: 03/06/1986    Years since quitting: 37.1   Smokeless tobacco: Former    Types: Chew    Quit date: 1985   Tobacco comments:    Started smoking around 88 yrs old.    Smoked 1 PPD at his heaviest.  Substance Use Topics   Alcohol use: No    Allergies  Allergen Reactions   Atorvastatin Other (See Comments)    Achy joints    Current Meds  Medication Sig   apixaban (ELIQUIS) 5 MG TABS tablet Take 1 tablet (5 mg total) by mouth 2 (two) times daily.   Calcium Carb-Cholecalciferol (CALCIUM + D3 PO) Take 1 tablet by mouth daily.   cholecalciferol (VITAMIN D) 1000 units tablet Take 1,000 Units by mouth daily.    ferrous sulfate 324 MG TBEC Take 324 mg by mouth daily.   isosorbide mononitrate (IMDUR) 30 MG 24 hr tablet Take 1 tablet (30 mg total) by mouth daily.   losartan (COZAAR) 25 MG tablet Take 1 tablet (25 mg total) by mouth daily.   lovastatin (MEVACOR) 40 MG tablet Take 2 tablets (80 mg total) by mouth at bedtime.   metoprolol succinate (TOPROL-XL) 25 MG 24 hr tablet TAKE 1 TABLET BY MOUTH ONCE DAILY   Multiple Vitamin (MULTI-VITAMINS) TABS  Take 1 tablet by mouth daily.    pantoprazole (PROTONIX) 40 MG tablet Take 40 mg by mouth 2 (two) times daily.    [DISCONTINUED] furosemide (LASIX) 40 MG tablet Take 1 tablet (40 mg total) by mouth every other day.   [DISCONTINUED] potassium chloride (KLOR-CON M) 10 MEQ tablet Take 1 tablet by mouth every other day with Furosemide.    Immunization History  Administered Date(s) Administered   Fluad Quad(high Dose 65+) 11/05/2018, 11/06/2019, 11/17/2020, 11/24/2021   Fluad Trivalent(High Dose 65+) 11/14/2022   Influenza, High Dose Seasonal PF 11/28/2016   Influenza-Unspecified 12/05/2011, 12/05/2015, 11/04/2017   Moderna Covid-19 Vaccine Bivalent Booster 94yrs & up 12/06/2021   PFIZER Comirnaty(Gray Top)Covid-19 Tri-Sucrose Vaccine 04/28/2019, 05/26/2019   PFIZER(Purple Top)SARS-COV-2 Vaccination 03/25/2019, 04/19/2019, 12/11/2019   Pneumococcal Conjugate-13 07/12/2016   Pneumococcal Polysaccharide-23 03/06/2006   Zoster Recombinant(Shingrix) 01/07/2019, 03/10/2019        Objective:     BP 118/78 (BP Location: Right Arm, Cuff Size: Large)   Pulse 77   Temp (!) 96.9 F (36.1 C)   Ht 5\' 10"  (1.778 m)   Wt 219 lb (99.3 kg)   SpO2 94%   BMI 31.42 kg/m   SpO2: 94 % O2 Device: None (Room air)  GENERAL: Well-developed, well-nourished elderly gentleman, no acute distress.  Fully ambulatory. HEAD: Normocephalic, atraumatic.  EYES: Pupils equal, round, reactive to light.  No scleral icterus.  MOUTH: Wears upper dentures, oral mucosa moist.  No thrush NECK: Supple. No thyromegaly. Trachea midline. No JVD.  No adenopathy. PULMONARY: Good air entry bilaterally.  No adventitious sounds. CARDIOVASCULAR: S1 and S2. Regular rate and rhythm.  There is a grade 1/6 mitral regurgitation murmur noted. ABDOMEN: Benign. MUSCULOSKELETAL: No joint deformity, no clubbing, no edema.  NEUROLOGIC: No overt focal deficit, no gait disturbance, speech is fluent. SKIN: Intact,warm,dry. PSYCH: Mood and  behavior normal.  Representative image from CT performed 23 March 2023  showing the granuloma on the right upper lobe (arrow):    Images shown to the patient.  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 OSA on CPAP  G47.33      Discussion:    Pulmonary Nodule Small, calcified pulmonary nodule in the right lung, likely a benign granuloma from previous pneumonia. No change in size over the years, not concerning for malignancy. Minimal fibrosis noted, likely from prior infections or smoking history, but not clinically significant. Explained benign nature and no further intervention needed. - No further intervention required - Send report to Dr. Lorin Picket  Sleep Apnea Sleep apnea, current management under Dr. Lorin Picket. Discussed option of seeing Dr. Betti Cruz for further management if needed. - Refer to Dr. Betti Cruz for sleep apnea management if needed  Coronary Artery Disease with Pacemaker Coronary artery disease with multiple stent placements and pacemaker inserted two years ago. No current issues with shortness of breath or other cardiac symptoms. Continue current cardiac management. - Continue current cardiac management  General Health Maintenance Former smoker, quit in 1988. No COPD or other significant pulmonary issues. 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  Follow-up - Return to clinic as needed.     Advised if symptoms do not improve or worsen, to please contact office for sooner follow up or seek emergency care.    I spent 45 minutes of dedicated to the care of this patient on the date of this encounter to include pre-visit review of records, face-to-face time with the patient discussing conditions above, post visit ordering of testing, clinical documentation with the electronic health record, making appropriate referrals as documented, and communicating necessary findings to members of the patients care  team.   C. Danice Goltz, MD Advanced Bronchoscopy PCCM [facility]-    *This note was dictated using voice recognition software/Dragon.  Despite best efforts to proofread, errors can occur which can change the meaning. Any transcriptional errors that result from this process are unintentional and may not be fully corrected at the time of dictation.

## 2023-05-01 NOTE — Patient Instructions (Signed)
 VISIT SUMMARY:  Today, we discussed your concerns about a lung nodule, your history of sleep apnea, and your coronary artery disease. We reviewed your medical history, including your past pneumonia, smoking history, and cardiac procedures. We also talked about your general health maintenance and lifestyle.  YOUR PLAN:  -PULMONARY NODULE: A small, calcified nodule in your right lung, likely a benign granuloma from a past pneumonia. It has not changed in size over the years and is not concerning for cancer. No further intervention is needed, and a report will be sent to Dr. Lorin Picket.  -SLEEP APNEA: A condition where breathing repeatedly stops and starts during sleep. You are currently managing this with Dr. Lorin Picket, but you have the option to see Dr. Betti Cruz for further management if needed.  -CORONARY ARTERY DISEASE WITH PACEMAKER: A condition where the heart's arteries are narrowed or blocked, often requiring stents or a pacemaker. You have a pacemaker and multiple stents, and there are no current issues with shortness of breath or other symptoms. Continue with your current cardiac management.  -GENERAL HEALTH MAINTENANCE: You are a former smoker who quit in 1988, with no COPD or other significant lung issues. 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 Continue with your current lifestyle and health practices.  INSTRUCTIONS:  Return to the clinic as needed. A report about your lung nodule will be sent to Dr. Lorin Picket. If you need further management for sleep apnea, consider seeing Dr. Betti Cruz.

## 2023-05-05 ENCOUNTER — Encounter: Payer: Self-pay | Admitting: Pulmonary Disease

## 2023-05-07 ENCOUNTER — Encounter: Payer: Self-pay | Admitting: Internal Medicine

## 2023-05-11 ENCOUNTER — Telehealth: Payer: Self-pay | Admitting: Internal Medicine

## 2023-05-11 DIAGNOSIS — R739 Hyperglycemia, unspecified: Secondary | ICD-10-CM

## 2023-05-11 DIAGNOSIS — N1831 Chronic kidney disease, stage 3a: Secondary | ICD-10-CM

## 2023-05-11 DIAGNOSIS — E7800 Pure hypercholesterolemia, unspecified: Secondary | ICD-10-CM

## 2023-05-11 DIAGNOSIS — I10 Essential (primary) hypertension: Secondary | ICD-10-CM

## 2023-05-11 DIAGNOSIS — I4891 Unspecified atrial fibrillation: Secondary | ICD-10-CM

## 2023-05-11 DIAGNOSIS — E538 Deficiency of other specified B group vitamins: Secondary | ICD-10-CM

## 2023-05-11 NOTE — Telephone Encounter (Signed)
 Patient need lab orders.

## 2023-05-12 NOTE — Telephone Encounter (Signed)
Orders placed for future labs.  

## 2023-05-15 ENCOUNTER — Other Ambulatory Visit: Payer: Medicare HMO

## 2023-05-15 ENCOUNTER — Inpatient Hospital Stay: Payer: Medicare HMO | Attending: Internal Medicine

## 2023-05-15 DIAGNOSIS — D509 Iron deficiency anemia, unspecified: Secondary | ICD-10-CM | POA: Insufficient documentation

## 2023-05-15 DIAGNOSIS — C61 Malignant neoplasm of prostate: Secondary | ICD-10-CM | POA: Insufficient documentation

## 2023-05-15 DIAGNOSIS — C7951 Secondary malignant neoplasm of bone: Secondary | ICD-10-CM | POA: Insufficient documentation

## 2023-05-15 LAB — CMP (CANCER CENTER ONLY)
ALT: 15 U/L (ref 0–44)
AST: 21 U/L (ref 15–41)
Albumin: 4.1 g/dL (ref 3.5–5.0)
Alkaline Phosphatase: 42 U/L (ref 38–126)
Anion gap: 8 (ref 5–15)
BUN: 23 mg/dL (ref 8–23)
CO2: 25 mmol/L (ref 22–32)
Calcium: 9.2 mg/dL (ref 8.9–10.3)
Chloride: 105 mmol/L (ref 98–111)
Creatinine: 1.34 mg/dL — ABNORMAL HIGH (ref 0.61–1.24)
GFR, Estimated: 52 mL/min — ABNORMAL LOW (ref >60)
Glucose, Bld: 107 mg/dL — ABNORMAL HIGH (ref 70–99)
Potassium: 4.1 mmol/L (ref 3.5–5.1)
Sodium: 138 mmol/L (ref 135–145)
Total Bilirubin: 0.9 mg/dL (ref 0.0–1.2)
Total Protein: 6.9 g/dL (ref 6.5–8.1)

## 2023-05-15 LAB — FERRITIN: Ferritin: 20 ng/mL — ABNORMAL LOW (ref 24–336)

## 2023-05-15 LAB — CBC WITH DIFFERENTIAL (CANCER CENTER ONLY)
Abs Immature Granulocytes: 0.01 10*3/uL (ref 0.00–0.07)
Basophils Absolute: 0 10*3/uL (ref 0.0–0.1)
Basophils Relative: 1 %
Eosinophils Absolute: 0.2 10*3/uL (ref 0.0–0.5)
Eosinophils Relative: 4 %
HCT: 37.1 % — ABNORMAL LOW (ref 39.0–52.0)
Hemoglobin: 12.2 g/dL — ABNORMAL LOW (ref 13.0–17.0)
Immature Granulocytes: 0 %
Lymphocytes Relative: 27 %
Lymphs Abs: 1.3 10*3/uL (ref 0.7–4.0)
MCH: 29.8 pg (ref 26.0–34.0)
MCHC: 32.9 g/dL (ref 30.0–36.0)
MCV: 90.7 fL (ref 80.0–100.0)
Monocytes Absolute: 0.5 10*3/uL (ref 0.1–1.0)
Monocytes Relative: 11 %
Neutro Abs: 2.8 10*3/uL (ref 1.7–7.7)
Neutrophils Relative %: 57 %
Platelet Count: 177 10*3/uL (ref 150–400)
RBC: 4.09 MIL/uL — ABNORMAL LOW (ref 4.22–5.81)
RDW: 13.1 % (ref 11.5–15.5)
WBC Count: 4.8 10*3/uL (ref 4.0–10.5)
nRBC: 0 % (ref 0.0–0.2)

## 2023-05-15 LAB — IRON AND TIBC
Iron: 81 ug/dL (ref 45–182)
Saturation Ratios: 21 % (ref 17.9–39.5)
TIBC: 393 ug/dL (ref 250–450)
UIBC: 312 ug/dL

## 2023-05-16 ENCOUNTER — Other Ambulatory Visit (INDEPENDENT_AMBULATORY_CARE_PROVIDER_SITE_OTHER): Payer: Medicare HMO

## 2023-05-16 DIAGNOSIS — E538 Deficiency of other specified B group vitamins: Secondary | ICD-10-CM

## 2023-05-16 DIAGNOSIS — R739 Hyperglycemia, unspecified: Secondary | ICD-10-CM

## 2023-05-16 DIAGNOSIS — I10 Essential (primary) hypertension: Secondary | ICD-10-CM | POA: Diagnosis not present

## 2023-05-16 DIAGNOSIS — E7800 Pure hypercholesterolemia, unspecified: Secondary | ICD-10-CM

## 2023-05-16 LAB — HEPATIC FUNCTION PANEL
ALT: 13 U/L (ref 0–53)
AST: 18 U/L (ref 0–37)
Albumin: 4.2 g/dL (ref 3.5–5.2)
Alkaline Phosphatase: 49 U/L (ref 39–117)
Bilirubin, Direct: 0.1 mg/dL (ref 0.0–0.3)
Total Bilirubin: 0.6 mg/dL (ref 0.2–1.2)
Total Protein: 6.5 g/dL (ref 6.0–8.3)

## 2023-05-16 LAB — LIPID PANEL
Cholesterol: 143 mg/dL (ref 0–200)
HDL: 38.5 mg/dL — ABNORMAL LOW (ref >39.00)
LDL Cholesterol: 80 mg/dL (ref 0–99)
NonHDL: 104.39
Total CHOL/HDL Ratio: 4
Triglycerides: 120 mg/dL (ref 0.0–149.0)
VLDL: 24 mg/dL (ref 0.0–40.0)

## 2023-05-16 LAB — PSA: Prostatic Specific Antigen: 0.01 ng/mL (ref 0.00–4.00)

## 2023-05-16 LAB — BASIC METABOLIC PANEL
BUN: 23 mg/dL (ref 6–23)
CO2: 27 meq/L (ref 19–32)
Calcium: 9.6 mg/dL (ref 8.4–10.5)
Chloride: 103 meq/L (ref 96–112)
Creatinine, Ser: 1.39 mg/dL (ref 0.40–1.50)
GFR: 46.25 mL/min — ABNORMAL LOW (ref >60.00)
Glucose, Bld: 103 mg/dL — ABNORMAL HIGH (ref 70–99)
Potassium: 4.2 meq/L (ref 3.5–5.1)
Sodium: 140 meq/L (ref 135–145)

## 2023-05-16 LAB — HEMOGLOBIN A1C: Hgb A1c MFr Bld: 5.7 % (ref 4.6–6.5)

## 2023-05-16 LAB — VITAMIN B12: Vitamin B-12: 373 pg/mL (ref 211–911)

## 2023-05-16 LAB — TSH: TSH: 2.19 u[IU]/mL (ref 0.35–5.50)

## 2023-05-17 ENCOUNTER — Encounter: Payer: Self-pay | Admitting: Internal Medicine

## 2023-05-17 ENCOUNTER — Other Ambulatory Visit: Payer: Medicare HMO

## 2023-05-17 ENCOUNTER — Ambulatory Visit: Payer: Medicare HMO | Admitting: Internal Medicine

## 2023-05-17 VITALS — BP 120/70 | HR 85 | Temp 98.2°F | Resp 16 | Ht 70.0 in | Wt 220.0 lb

## 2023-05-17 DIAGNOSIS — I25119 Atherosclerotic heart disease of native coronary artery with unspecified angina pectoris: Secondary | ICD-10-CM

## 2023-05-17 DIAGNOSIS — I724 Aneurysm of artery of lower extremity: Secondary | ICD-10-CM

## 2023-05-17 DIAGNOSIS — G4730 Sleep apnea, unspecified: Secondary | ICD-10-CM

## 2023-05-17 DIAGNOSIS — I10 Essential (primary) hypertension: Secondary | ICD-10-CM | POA: Diagnosis not present

## 2023-05-17 DIAGNOSIS — R739 Hyperglycemia, unspecified: Secondary | ICD-10-CM

## 2023-05-17 DIAGNOSIS — I4891 Unspecified atrial fibrillation: Secondary | ICD-10-CM

## 2023-05-17 DIAGNOSIS — R911 Solitary pulmonary nodule: Secondary | ICD-10-CM

## 2023-05-17 DIAGNOSIS — I6523 Occlusion and stenosis of bilateral carotid arteries: Secondary | ICD-10-CM

## 2023-05-17 DIAGNOSIS — I5032 Chronic diastolic (congestive) heart failure: Secondary | ICD-10-CM

## 2023-05-17 DIAGNOSIS — D500 Iron deficiency anemia secondary to blood loss (chronic): Secondary | ICD-10-CM

## 2023-05-17 DIAGNOSIS — N1831 Chronic kidney disease, stage 3a: Secondary | ICD-10-CM

## 2023-05-17 DIAGNOSIS — E7800 Pure hypercholesterolemia, unspecified: Secondary | ICD-10-CM | POA: Diagnosis not present

## 2023-05-17 DIAGNOSIS — I714 Abdominal aortic aneurysm, without rupture, unspecified: Secondary | ICD-10-CM

## 2023-05-17 DIAGNOSIS — C61 Malignant neoplasm of prostate: Secondary | ICD-10-CM

## 2023-05-17 DIAGNOSIS — E538 Deficiency of other specified B group vitamins: Secondary | ICD-10-CM

## 2023-05-17 DIAGNOSIS — Z8673 Personal history of transient ischemic attack (TIA), and cerebral infarction without residual deficits: Secondary | ICD-10-CM

## 2023-05-17 MED ORDER — LOVASTATIN 40 MG PO TABS: 80.0000 mg | ORAL_TABLET | Freq: Every day | ORAL | 1 refills | Status: DC

## 2023-05-17 MED ORDER — ISOSORBIDE MONONITRATE ER 30 MG PO TB24: 30.0000 mg | ORAL_TABLET | Freq: Every day | ORAL | 2 refills | Status: DC

## 2023-05-17 NOTE — Assessment & Plan Note (Signed)
 Evaluated [facility] 08/2022.  Discussed with [facility].  Plan f/u next visit - continue annual.

## 2023-05-17 NOTE — Assessment & Plan Note (Signed)
Appears to be in SR today.  Follow. Continue eliquis.

## 2023-05-17 NOTE — Assessment & Plan Note (Signed)
 Continue lovastatin.  Follow lipid panel and liver function tests.  Schedule fasting labs.

## 2023-05-17 NOTE — Progress Notes (Signed)
 Remote pacemaker transmission.

## 2023-05-17 NOTE — Assessment & Plan Note (Signed)
 Saw [facility] Vivia Birmingham) 01/2022 - recommended f/u aortic duplex in 12 months. AAA [place] 01/22/23: unchanged from prior measuring 2.8 cm in diameter. Will continue to monitor for symptoms, otherwise recommend continued outpatient surveillance. Per note, f/u aortic ultrasound in 12 months.

## 2023-05-17 NOTE — Assessment & Plan Note (Signed)
 Sees cardiology. Continue isosorbide, losartan, lovastatin and metoprolol. Stable. No chest pain. Doing well on lower dose losartan.

## 2023-05-17 NOTE — Assessment & Plan Note (Signed)
 Blood pressure as outlined.  On imdur and losartan.  Now on losartan 25mg  q day. Continue to follow pressure.  Continue to monitor metabolic panel.  Overall doing well.

## 2023-05-17 NOTE — Assessment & Plan Note (Signed)
 Followed by cardiology.  ECHO - 05/2019 - EF 55-60%.  Continues on losartan, imdur.  TTE negative for LV thrombus, LVEF >55%. No evidence of volume overload on exam.

## 2023-05-17 NOTE — Assessment & Plan Note (Signed)
Continue to avoid antiinflammatories.  Follow metabolic panel.  

## 2023-05-17 NOTE — Assessment & Plan Note (Signed)
 Evaluated by Dr Jayme Cloud. No further w/up felt warranted.

## 2023-05-17 NOTE — Assessment & Plan Note (Signed)
 Follow met b and A1c.

## 2023-05-17 NOTE — Addendum Note (Signed)
 Addended by: Geralyn Flash D on: 05/17/2023 01:12 PM   Modules accepted: Orders

## 2023-05-17 NOTE — Assessment & Plan Note (Signed)
Start oral B12 102mcg q day.

## 2023-05-17 NOTE — Assessment & Plan Note (Signed)
 Seeing hematology - f/u IDA.  Recommended taking gentle iron. Has f/u next week with Dr Donneta Romberg.

## 2023-05-17 NOTE — Progress Notes (Signed)
 Subjective:    Patient ID: Nathaniel Thompson, male    DOB: 1935-07-19, 88 y.o.   MRN: [id number]  Patient here for  Chief Complaint  Patient presents with   Medical Management of Chronic Issues    HPI Here for a scheduled follow up - follow up regarding CAD, CKD, hypertension, sleep apnea *using cpap" and hypercholesterolemia. Recently evaluated by Dr Jayme Cloud 05/01/23 - lung nodule. Felt was likely a benign granuloma from a past pneumonia. Stable. Recommended no further intervention. Is s/p pacemaker placement in 03/2021. Admitted 02/2023 [facility] - presumed TIA. Had f/u with Dr Kirke Corin 04/20/23. Continue furosemide 40mg  every other day. Continued off amlodipine. Losartan decreased to 25mg  q day. Reported episodes of vertigo and decreased responsiveness. Pacemaker interrogation was unremarkable. He has not had any more spells recently. Still may get a little light headed with standing, but overall feeling good. No chest pain or sob reported. No cough or congestion. No abdominal pain or bowel change. Discussed labs. Blood pressure doing well.    Past Medical History:  Diagnosis Date   (HFpEF) heart failure with preserved ejection fraction (HCC)    a. 05/2018 Echo: Nl EF, Gr1 DD, sev dil LA, mild MR/TR, mild PAH; b. 05/2019 Echo: EF 55-60%, no rwma, mild LVH. Nl PASP. Mildly dil LA. Triv MR. Mild AoV sclerosis w/o stenosis. Mildly dil Asc AO (39mm).   Allergic rhinitis    Barrett's esophagus 10/21/2013   Bone cancer (HCC)    CKD (chronic kidney disease), stage III (HCC)    Colon polyp, hyperplastic    COPD (chronic obstructive pulmonary disease) (HCC)    mild COPD. former smoker   Coronary artery disease    a. 2003 s/p PCI ([facility]); b. 2004 s/p PCI x 2 ([facility]); c. 11/2017 Cath: LM nl, LAD mild dzs, LCX patent stent w/ 30% distal edge restenosis, RCA patent distal stent w/ 30% prox edge stenosis. Nl EF->Med Rx;   Diverticulosis    Fundic gland polyps of stomach, benign    Gastritis    GERD  (gastroesophageal reflux disease)    Hypercholesteremia    Hypertension    Prostate cancer (HCC)    Prostatism    Reflux esophagitis    Renal stones    Skin cancer    left cheek/lesion excised   Sleep apnea    Tachycardia    a. 11/2017 admit w/ tachycardia/? afib-->no afib noted upon review (amio/OAC d/c'd).   TIA (transient ischemic attack)    Past Surgical History:  Procedure Laterality Date   CARDIAC CATHETERIZATION     COLONOSCOPY WITH PROPOFOL N/A 09/13/2015   Procedure: COLONOSCOPY WITH PROPOFOL;  Surgeon: Christena Deem, MD;  Location: [facility] ENDOSCOPY;  Service: Endoscopy;  Laterality: N/A;   COLONOSCOPY WITH PROPOFOL N/A 05/07/2019   Procedure: COLONOSCOPY WITH PROPOFOL;  Surgeon: Earline Mayotte, MD;  Location: [facility] ENDOSCOPY;  Service: Endoscopy;  Laterality: N/A;   CORONARY ANGIOPLASTY     ESOPHAGOGASTRODUODENOSCOPY (EGD) WITH PROPOFOL N/A 09/13/2015   Procedure: ESOPHAGOGASTRODUODENOSCOPY (EGD) WITH PROPOFOL;  Surgeon: Christena Deem, MD;  Location: [facility] ENDOSCOPY;  Service: Endoscopy;  Laterality: N/A;   ESOPHAGOGASTRODUODENOSCOPY (EGD) WITH PROPOFOL N/A 12/28/2015   Procedure: ESOPHAGOGASTRODUODENOSCOPY (EGD) WITH PROPOFOL;  Surgeon: Christena Deem, MD;  Location: [facility] ENDOSCOPY;  Service: Endoscopy;  Laterality: N/A;   ESOPHAGOGASTRODUODENOSCOPY (EGD) WITH PROPOFOL N/A 06/16/2016   Procedure: ESOPHAGOGASTRODUODENOSCOPY (EGD) WITH PROPOFOL;  Surgeon: Christena Deem, MD;  Location: [facility] ENDOSCOPY;  Service: Endoscopy;  Laterality: N/A;   ESOPHAGOGASTRODUODENOSCOPY (  EGD) WITH PROPOFOL N/A 05/07/2019   Procedure: ESOPHAGOGASTRODUODENOSCOPY (EGD) WITH PROPOFOL;  Surgeon: Earline Mayotte, MD;  Location: [facility] ENDOSCOPY;  Service: Endoscopy;  Laterality: N/A;   EYE SURGERY  2013   CATARACT EXTRACTION   GANGLION CYST EXCISION Right 05/18/2017   Procedure: REMOVAL GANGLION CYST ANKLE;  Surgeon: Gwyneth Revels, DPM;  Location: [facility] ORS;  Service: Podiatry;  Laterality:  Right;   heart cath stent     LEFT HEART CATH AND CORONARY ANGIOGRAPHY Right 12/03/2017   Procedure: Left Heart Cath and Coronary Angiography with possible coronary intervention;  Surgeon: Laurier Nancy, MD;  Location: [facility] INVASIVE CV LAB;  Service: Cardiovascular;  Laterality: Right;   PACEMAKER IMPLANT N/A 04/04/2021   Procedure: PACEMAKER IMPLANT;  Surgeon: [facility] Salvia, MD;  Location: [facility] INVASIVE CV LAB;  Service: Cardiovascular;  Laterality: N/A;   PROSTATE BIOPSY     RIGHT/LEFT HEART CATH AND CORONARY ANGIOGRAPHY N/A 02/07/2021   Procedure: RIGHT/LEFT HEART CATH AND CORONARY ANGIOGRAPHY;  Surgeon: Iran Ouch, MD;  Location: [facility] INVASIVE CV LAB;  Service: Cardiovascular;  Laterality: N/A;   stents     multiple   Family History  Problem Relation Age of Onset   Cancer Mother        gastric and lung   Cancer Father        multiple myeloma   Stroke Father    Cancer Sister        leukemia   Cancer Brother        leukemia   Cancer Brother        kidney   Cancer Daughter        Uterine   Cancer Other        Nephes (Sister's Son): Prostate   Social History   Socioeconomic History   Marital status: Married    Spouse name: Glenda   Number of children: 2   Years of education: Not on file   Highest education level: Not on file  Occupational History   Occupation: retired Production designer, theatre/television/film for [employer]  Tobacco Use   Smoking status: Former    Current packs/day: 0.00    Average packs/day: 1 pack/day for 35.0 years (35.0 ttl pk-yrs)    Types: Cigarettes    Start date: 03/07/1951    Quit date: 03/06/1986    Years since quitting: 37.2   Smokeless tobacco: Former    Types: Chew    Quit date: 1985   Tobacco comments:    Started smoking around 88 yrs old.    Smoked 1 PPD at his heaviest.  Vaping Use   Vaping status: Never Used  Substance and Sexual Activity   Alcohol use: No   Drug use: No   Sexual activity: Not Currently  Other Topics Concern   Not on file  Social  History Narrative   Married, Rivka Barbara 2 step children and 2 children from previous marriage   Social Drivers of Corporate investment banker Strain: Low Risk  (10/19/2022)   Overall Financial Resource Strain (CARDIA)    Difficulty of Paying Living Expenses: Not hard at all  Food Insecurity: No Food Insecurity (10/19/2022)   Hunger Vital Sign    Worried About Running Out of Food in the Last Year: Never true    Ran Out of Food in the Last Year: Never true  Transportation Needs: No Transportation Needs (10/19/2022)   PRAPARE - Administrator, Civil Service (Medical): No    Lack of Transportation (Non-Medical):  No  Physical Activity: Sufficiently Active (10/19/2022)   Exercise Vital Sign    Days of Exercise per Week: 5 days    Minutes of Exercise per Session: 30 min  Stress: No Stress Concern Present (10/19/2022)   [employer] of Occupational Health - Occupational Stress Questionnaire    Feeling of Stress : Not at all  Social Connections: Moderately Integrated (10/19/2022)   Social Connection and Isolation Panel [NHANES]    Frequency of Communication with Friends and Family: More than three times a week    Frequency of Social Gatherings with Friends and Family: Three times a week    Attends Religious Services: More than 4 times per year    Active Member of Clubs or Organizations: No    Attends Banker Meetings: Never    Marital Status: Married     Review of Systems  Constitutional:  Negative for appetite change and unexpected weight change.  HENT:  Negative for congestion and sinus pressure.   Respiratory:  Negative for cough, chest tightness and shortness of breath.   Cardiovascular:  Negative for chest pain, palpitations and leg swelling.  Gastrointestinal:  Negative for abdominal pain, diarrhea, nausea and vomiting.  Genitourinary:  Negative for difficulty urinating and dysuria.  Musculoskeletal:  Negative for joint swelling and myalgias.  Skin:  Negative  for color change and rash.  Neurological:  Negative for dizziness and headaches.       Occasional light headedness.   Psychiatric/Behavioral:  Negative for agitation and dysphoric mood.        Objective:     BP 120/70   Pulse 85   Temp 98.2 F (36.8 C)   Resp 16   Ht 5\' 10"  (1.778 m)   Wt 220 lb (99.8 kg)   SpO2 99%   BMI 31.57 kg/m  Wt Readings from Last 3 Encounters:  05/17/23 220 lb (99.8 kg)  05/01/23 219 lb (99.3 kg)  04/20/23 215 lb 3.2 oz (97.6 kg)    Physical Exam Vitals reviewed.  Constitutional:      General: He is not in acute distress.    Appearance: Normal appearance. He is well-developed.  HENT:     Head: Normocephalic and atraumatic.     Right Ear: External ear normal.     Left Ear: External ear normal.     Mouth/Throat:     Pharynx: No oropharyngeal exudate or posterior oropharyngeal erythema.  Eyes:     General: No scleral icterus.       Right eye: No discharge.        Left eye: No discharge.     Conjunctiva/sclera: Conjunctivae normal.  Cardiovascular:     Rate and Rhythm: Normal rate and regular rhythm.  Pulmonary:     Effort: Pulmonary effort is normal. No respiratory distress.     Breath sounds: Normal breath sounds.  Abdominal:     General: Bowel sounds are normal.     Palpations: Abdomen is soft.     Tenderness: There is no abdominal tenderness.  Musculoskeletal:        General: No swelling or tenderness.     Cervical back: Neck supple. No tenderness.  Lymphadenopathy:     Cervical: No cervical adenopathy.  Skin:    Findings: No erythema or rash.  Neurological:     Mental Status: He is alert.  Psychiatric:        Mood and Affect: Mood normal.        Behavior: Behavior normal.  Outpatient Encounter Medications as of 05/17/2023  Medication Sig   apixaban (ELIQUIS) 5 MG TABS tablet Take 1 tablet (5 mg total) by mouth 2 (two) times daily.   Calcium Carb-Cholecalciferol (CALCIUM + D3 PO) Take 1 tablet by mouth daily.    cholecalciferol (VITAMIN D) 1000 units tablet Take 1,000 Units by mouth daily.    ferrous sulfate 324 MG TBEC Take 324 mg by mouth daily.   furosemide (LASIX) 40 MG tablet TAKE 1 TABLET BY MOUTH EVERY OTHER DAY AS DIRECTED   Leuprolide Acetate, 3 Month, (ELIGARD) 22.5 MG injection Inject 22.5 mg into the skin every 4 (four) months.   losartan (COZAAR) 25 MG tablet Take 1 tablet (25 mg total) by mouth daily.   metoprolol succinate (TOPROL-XL) 25 MG 24 hr tablet TAKE 1 TABLET BY MOUTH ONCE DAILY   Multiple Vitamin (MULTI-VITAMINS) TABS Take 1 tablet by mouth daily.    pantoprazole (PROTONIX) 40 MG tablet Take 40 mg by mouth 2 (two) times daily.    potassium chloride (KLOR-CON) 10 MEQ tablet TAKE 1 TABLET BY MOUTH EVERY OTHER DAY WITH FUROSEMID   isosorbide mononitrate (IMDUR) 30 MG 24 hr tablet Take 1 tablet (30 mg total) by mouth daily.   lovastatin (MEVACOR) 40 MG tablet Take 2 tablets (80 mg total) by mouth at bedtime.   [DISCONTINUED] isosorbide mononitrate (IMDUR) 30 MG 24 hr tablet Take 1 tablet (30 mg total) by mouth daily.   [DISCONTINUED] lovastatin (MEVACOR) 40 MG tablet Take 2 tablets (80 mg total) by mouth at bedtime.   No facility-administered encounter medications on file as of 05/17/2023.     Lab Results  Component Value Date   WBC 4.8 05/15/2023   HGB 12.2 (L) 05/15/2023   HCT 37.1 (L) 05/15/2023   PLT 177 05/15/2023   GLUCOSE 103 (H) 05/16/2023   CHOL 143 05/16/2023   TRIG 120.0 05/16/2023   HDL 38.50 (L) 05/16/2023   LDLCALC 80 05/16/2023   ALT 13 05/16/2023   AST 18 05/16/2023   NA 140 05/16/2023   K 4.2 05/16/2023   CL 103 05/16/2023   CREATININE 1.39 05/16/2023   BUN 23 05/16/2023   CO2 27 05/16/2023   TSH 2.19 05/16/2023   PSA 5.47 (H) 08/03/2016   INR 1.2 02/16/2023   HGBA1C 5.7 05/16/2023    CT Chest Wo Contrast Result Date: 03/31/2023 CLINICAL DATA:  Lung nodule cough EXAM: CT CHEST WITHOUT CONTRAST TECHNIQUE: Multidetector CT imaging of the chest was  performed following the standard protocol without IV contrast. RADIATION DOSE REDUCTION: This exam was performed according to the departmental dose-optimization program which includes automated exposure control, adjustment of the mA and/or kV according to patient size and/or use of iterative reconstruction technique. COMPARISON:  None Available. FINDINGS: Cardiovascular: Nonaneurysmal aorta. Advanced aortic atherosclerosis. Left-sided cardiac pacing device. Cardiomegaly. No pericardial effusion Mediastinum/Nodes: Patent trachea. No thyroid mass. Multiple subcentimeter mediastinal lymph nodes. Esophagus within normal limits Lungs/Pleura: Mild subpleural reticulation consistent with fibrosis. Calcified granuloma in the right upper lobe. No suspicious lung nodules or acute airspace disease Upper Abdomen: Subcentimeter hypodensities in the liver too small to further characterize. Gallstones. No acute findings Musculoskeletal: No acute osseous abnormality IMPRESSION: 1. No CT evidence for acute intrathoracic abnormality. 2. Mild subpleural reticulation consistent with fibrosis. Calcified granuloma in the right upper lobe for which no additional imaging follow-up is recommended. 3. Cardiomegaly. 4. Gallstones. 5. Aortic atherosclerosis. Aortic Atherosclerosis (ICD10-I70.0). Electronically Signed   By: Jasmine Pang M.D.   On: 03/31/2023 00:09  Assessment & Plan:  Hypertension, essential Assessment & Plan: Blood pressure as outlined.  On imdur and losartan.  Now on losartan 25mg  q day. Continue to follow pressure.  Continue to monitor metabolic panel.  Overall doing well.   Orders: -     Basic metabolic panel; Future  Hyperglycemia Assessment & Plan: Follow met b and A1c.   Orders: -     Hemoglobin A1c; Future  Hypercholesterolemia Assessment & Plan: Continue lovastatin.  Follow lipid panel and liver function tests.  Schedule fasting labs.   Orders: -     Lipid panel; Future -     Hepatic  function panel; Future  Bilateral carotid artery stenosis Assessment & Plan: Evaluated 01/2023 - Duplex ultrasound shows 1-39% ICA stenosis bilaterally. Continue antiplatelet therapy as prescribed Continue management of CAD, HTN and Hyperlipidemia. Follow up in 24-36 months with duplex ultrasound and physical exam   Sleep apnea, unspecified type Assessment & Plan: Continue cpap.    Prostate cancer [facility]) Assessment & Plan: Seeing oncology.  Has been on eligard. Continue active surveillance.    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  Discussed with [facility].  Plan f/u next visit - continue annual.    Lung nodule Assessment & Plan: Evaluated by Dr Jayme Cloud. No further w/up felt warranted.    Iron deficiency anemia due to chronic blood loss Assessment & Plan: Seeing hematology - f/u IDA.  Recommended taking gentle iron. Has f/u next week with Dr Donneta Romberg.    History of TIA (transient ischemic attack) Assessment & Plan: Admitted 02/10/23 - 02/13/23 - for acute stroke. Day of presentation the patient woke up at baseline and drove to [place]. Around 11:30 AM he started to feel light headed and generally weak. This progressed to slurred speech with right sided hemiparesis and paraesthesias. CT head negative for acute bleed. CTA head left ICA stenosis no LVO. CTA neck/dopplers no significant stenosis.TTE negative for LV thrombus, LVEF >55%. MRI brain without acute infarct, old left parietal microhemorrhage and right frontal infarct. Etiology of event uncertain, acute onset most concerning for stroke, despite negative imaging may represent MRI-negative stroke given risk factors and prior infarcts, timeline of symptoms could indicate TIA . Patient with improving exam throughout admission to near baseline with mild R hemibody subjective sensory loss. Discharged on home anticoagulation with Eliquis 5mg  BID and home statin. Has been doing relatively well as  outlined. Still some occasional light headedness. Keep appt with neurology this month.  Episodes of "staring off".  Keep neurology appt. Discussed possible EEG.    Coronary artery disease involving native coronary artery of native heart with angina pectoris [facility]) Assessment & Plan: Sees cardiology. Continue isosorbide, losartan, lovastatin and metoprolol. Stable. No chest pain. Doing well on lower dose losartan.    Stage 3a chronic kidney disease (HCC) Assessment & Plan: Continue to avoid antiinflammatories. Follow metabolic panel.    Chronic heart failure with preserved ejection fraction [facility]) Assessment & Plan: Followed by cardiology.  ECHO - 05/2019 - EF 55-60%.  Continues on losartan, imdur.  TTE negative for LV thrombus, LVEF >55%. No evidence of volume overload on exam.    B12 deficiency Assessment & Plan: Start oral B12 q day.    Atrial fibrillation, unspecified type [facility]) Assessment & Plan: Appears to be in SR today.  Follow. Continue eliquis.    Abdominal aortic aneurysm (AAA) without rupture, unspecified part [facility]) Assessment & Plan: Saw [facility]) 01/2022 - recommended f/u aortic duplex in 12 months.  AAA [place] 01/22/23: unchanged from prior measuring 2.8 cm in diameter. Will continue to monitor for symptoms, otherwise recommend continued outpatient surveillance. Per note, f/u aortic ultrasound in 12 months.    Other orders -     Isosorbide Mononitrate ER; Take 1 tablet (30 mg total) by mouth daily.  Dispense: 90 tablet; Refill: 2 -     Lovastatin; Take 2 tablets (80 mg total) by mouth at bedtime.  Dispense: 180 tablet; Refill: 1     Dale Wheatley, MD

## 2023-05-17 NOTE — Assessment & Plan Note (Addendum)
 Admitted 02/10/23 - 02/13/23 - for acute stroke. Day of presentation the patient woke up at baseline and drove to [place]. Around 11:30 AM he started to feel light headed and generally weak. This progressed to slurred speech with right sided hemiparesis and paraesthesias. CT head negative for acute bleed. CTA head left ICA stenosis no LVO. CTA neck/dopplers no significant stenosis.TTE negative for LV thrombus, LVEF >55%. MRI brain without acute infarct, old left parietal microhemorrhage and right frontal infarct. Etiology of event uncertain, acute onset most concerning for stroke, despite negative imaging may represent MRI-negative stroke given risk factors and prior infarcts, timeline of symptoms could indicate TIA . Patient with improving exam throughout admission to near baseline with mild R hemibody subjective sensory loss. Discharged on home anticoagulation with Eliquis 5mg  BID and home statin. Has been doing relatively well as outlined. Still some occasional light headedness. Keep appt with neurology this month.  Episodes of "staring off".  Keep neurology appt. Discussed possible EEG.

## 2023-05-17 NOTE — Assessment & Plan Note (Signed)
 Evaluated 01/2023 - Duplex ultrasound shows 1-39% ICA stenosis bilaterally. Continue antiplatelet therapy as prescribed Continue management of CAD, HTN and Hyperlipidemia. Follow up in 24-36 months with duplex ultrasound and physical exam

## 2023-05-17 NOTE — Assessment & Plan Note (Signed)
 Continue cpap.

## 2023-05-17 NOTE — Assessment & Plan Note (Signed)
 Seeing oncology.  Has been on eligard. Continue active surveillance.

## 2023-05-18 ENCOUNTER — Encounter: Payer: Self-pay | Admitting: Internal Medicine

## 2023-05-18 ENCOUNTER — Inpatient Hospital Stay: Payer: Medicare HMO

## 2023-05-18 ENCOUNTER — Inpatient Hospital Stay: Payer: Medicare HMO | Admitting: Internal Medicine

## 2023-05-18 VITALS — BP 120/71 | HR 76 | Resp 18

## 2023-05-18 DIAGNOSIS — C61 Malignant neoplasm of prostate: Secondary | ICD-10-CM | POA: Diagnosis not present

## 2023-05-18 DIAGNOSIS — D509 Iron deficiency anemia, unspecified: Secondary | ICD-10-CM | POA: Diagnosis not present

## 2023-05-18 MED ORDER — IRON SUCROSE 20 MG/ML IV SOLN
200.0000 mg | Freq: Once | INTRAVENOUS | Status: AC
  Administered 2023-05-18: 200 mg via INTRAVENOUS
  Filled 2023-05-18: qty 10

## 2023-05-18 NOTE — Progress Notes (Signed)
 Good appetite. Energy is not too good. Having right hip pain. Knees hurt. Dyspnea with exertion. Bowels good.Denies pelvic pain or probs urinating. No blood in urine.

## 2023-05-18 NOTE — Progress Notes (Signed)
 [facility] OFFICE PROGRESS NOTE  Patient Care Team: Dale Ridgetop, MD as PCP - General (Internal Medicine) Iran Ouch, MD as PCP - Cardiology (Cardiology) Iran Ouch, MD as Consulting Physician (Cardiology) Earna Coder, MD as Consulting Physician (Hematology and Oncology) Gilda Crease, Latina Craver, MD (Vascular Surgery) Louellen Molder, NP as Nurse Practitioner (Gastroenterology) Salena Saner, MD as Consulting Physician (Pulmonary Disease)   Cancer Staging  No matching staging information was found for the patient.    Oncology History Overview Note  Nov 2017- completed IM RT radiation therapy to his prostate and pelvic nodes for Gleason 7 (4+3) adenocarcinoma the prostate presenting the PSA of 7.8.  # JAN 2019- METASTATIC PROSTATE CA to Bone [low volume met on bone scan; CT- NED]; PSA ~50; March 25th 2019- Lupron 45 mg IM [Urology q 36M]; June 26, 2018-Eligard every 3 months[intolerance to Lupron/question A. Fib/injection site pain  # May 23rd Zytiga 250mg /day; no prednisone; STOPPED MARCH 17th 2021-repeated severe hypokalemia; Eligard  # Barretts- EGD/ colo Ivin Poot 2021; Dr.Byrnett].   # Charm Rings, [place]]; Oct 2019-paroxysmal A.fib/not on eliquis; [Dr.Khan] -Dr.Arida ; Dizzy spells [Dr.Shah] s/p PPM [2023]  ------------------------------------------------------------------  DIAGNOSIS: [ JAN 2019]- Met- PROSTATE CANCER  STAGE:  IV       ;GOALS: PALLIATIVE     Prostate cancer (HCC)    INTERVAL HISTORY: Patient is with his wife.  He is walking with a cane.   Nathaniel Thompson 88 y.o.  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  In the interim patient patient had slurring of speech weakness of the right upper extremity-concerning for stroke.  However this resolved after couple of hours.  S/p  evaluation [facility] with MRI.  Patient was continued on Eliquis.  Good appetite. Energy is not too good. Having right hip pain. Knees hurt. Dyspnea with exertion. Bowels good.Denies pelvic pain or probs urinating. No blood in urine  Continues have intermittent hemorrhoid that bleeds on the toilet tissue bright red blood.  No pain in pelvis. Voiding well. Appetite is fair. Occ hot flashes remain.   Review of Systems  Constitutional:  Negative for chills, diaphoresis, fever, malaise/fatigue and weight loss.  HENT:  Negative for nosebleeds and sore throat.   Eyes:  Negative for double vision.  Respiratory:  Negative for cough, hemoptysis, sputum production, shortness of breath and wheezing.   Cardiovascular:  Negative for chest pain, palpitations, orthopnea and leg swelling.  Gastrointestinal:  Negative for abdominal pain, blood in stool, constipation, diarrhea, heartburn, melena, nausea and vomiting.  Genitourinary:  Negative for dysuria, frequency and urgency.  Musculoskeletal:  Positive for myalgias. Negative for back pain and joint pain.  Skin: Negative.  Negative for itching and rash.  Neurological:  Negative for tingling, focal weakness, weakness and headaches.  Endo/Heme/Allergies:  Does not bruise/bleed easily.  Psychiatric/Behavioral:  Negative for depression. The patient is not nervous/anxious and does not have insomnia.     PAST MEDICAL HISTORY :  Past Medical History:  Diagnosis Date   (HFpEF) heart failure with preserved ejection fraction (HCC)    a. 05/2018 Echo: Nl EF, Gr1 DD, sev dil LA, mild MR/TR, mild PAH; b. 05/2019 Echo: EF 55-60%, no rwma, mild LVH. Nl PASP. Mildly dil LA. Triv MR. Mild AoV sclerosis w/o stenosis. Mildly dil Asc AO (39mm).   Allergic rhinitis    Barrett's esophagus 10/21/2013  Bone cancer (HCC)    CKD (chronic kidney disease), stage III (HCC)    Colon polyp, hyperplastic    COPD (chronic obstructive pulmonary disease) (HCC)    mild COPD. former smoker    Coronary artery disease    a. 2003 s/p PCI ([facility]); b. 2004 s/p PCI x 2 ([facility]); c. 11/2017 Cath: LM nl, LAD mild dzs, LCX patent stent w/ 30% distal edge restenosis, RCA patent distal stent w/ 30% prox edge stenosis. Nl EF->Med Rx;   Diverticulosis    Fundic gland polyps of stomach, benign    Gastritis    GERD (gastroesophageal reflux disease)    Hypercholesteremia    Hypertension    Prostate cancer (HCC)    Prostatism    Reflux esophagitis    Renal stones    Skin cancer    left cheek/lesion excised   Sleep apnea    Tachycardia    a. 11/2017 admit w/ tachycardia/? afib-->no afib noted upon review (amio/OAC d/c'd).   TIA (transient ischemic attack)     PAST SURGICAL HISTORY :   Past Surgical History:  Procedure Laterality Date   CARDIAC CATHETERIZATION     COLONOSCOPY WITH PROPOFOL N/A 09/13/2015   Procedure: COLONOSCOPY WITH PROPOFOL;  Surgeon: Christena Deem, MD;  Location: [facility] ENDOSCOPY;  Service: Endoscopy;  Laterality: N/A;   COLONOSCOPY WITH PROPOFOL N/A 05/07/2019   Procedure: COLONOSCOPY WITH PROPOFOL;  Surgeon: Earline Mayotte, MD;  Location: [facility] ENDOSCOPY;  Service: Endoscopy;  Laterality: N/A;   CORONARY ANGIOPLASTY     ESOPHAGOGASTRODUODENOSCOPY (EGD) WITH PROPOFOL N/A 09/13/2015   Procedure: ESOPHAGOGASTRODUODENOSCOPY (EGD) WITH PROPOFOL;  Surgeon: Christena Deem, MD;  Location: [facility] ENDOSCOPY;  Service: Endoscopy;  Laterality: N/A;   ESOPHAGOGASTRODUODENOSCOPY (EGD) WITH PROPOFOL N/A 12/28/2015   Procedure: ESOPHAGOGASTRODUODENOSCOPY (EGD) WITH PROPOFOL;  Surgeon: Christena Deem, MD;  Location: [facility] ENDOSCOPY;  Service: Endoscopy;  Laterality: N/A;   ESOPHAGOGASTRODUODENOSCOPY (EGD) WITH PROPOFOL N/A 06/16/2016   Procedure: ESOPHAGOGASTRODUODENOSCOPY (EGD) WITH PROPOFOL;  Surgeon: Christena Deem, MD;  Location: [facility] ENDOSCOPY;  Service: Endoscopy;  Laterality: N/A;   ESOPHAGOGASTRODUODENOSCOPY (EGD) WITH PROPOFOL N/A 05/07/2019   Procedure:  ESOPHAGOGASTRODUODENOSCOPY (EGD) WITH PROPOFOL;  Surgeon: Earline Mayotte, MD;  Location: [facility] ENDOSCOPY;  Service: Endoscopy;  Laterality: N/A;   EYE SURGERY  2013   CATARACT EXTRACTION   GANGLION CYST EXCISION Right 05/18/2017   Procedure: REMOVAL GANGLION CYST ANKLE;  Surgeon: Gwyneth Revels, DPM;  Location: [facility] ORS;  Service: Podiatry;  Laterality: Right;   heart cath stent     LEFT HEART CATH AND CORONARY ANGIOGRAPHY Right 12/03/2017   Procedure: Left Heart Cath and Coronary Angiography with possible coronary intervention;  Surgeon: Laurier Nancy, MD;  Location: [facility] INVASIVE CV LAB;  Service: Cardiovascular;  Laterality: Right;   PACEMAKER IMPLANT N/A 04/04/2021   Procedure: PACEMAKER IMPLANT;  Surgeon: [facility] Salvia, MD;  Location: [facility] INVASIVE CV LAB;  Service: Cardiovascular;  Laterality: N/A;   PROSTATE BIOPSY     RIGHT/LEFT HEART CATH AND CORONARY ANGIOGRAPHY N/A 02/07/2021   Procedure: RIGHT/LEFT HEART CATH AND CORONARY ANGIOGRAPHY;  Surgeon: Iran Ouch, MD;  Location: [facility] INVASIVE CV LAB;  Service: Cardiovascular;  Laterality: N/A;   stents     multiple    FAMILY HISTORY :   Family History  Problem Relation Age of Onset   Cancer Mother        gastric and lung   Cancer Father        multiple myeloma   Stroke  Father    Cancer Sister        leukemia   Cancer Brother        leukemia   Cancer Brother        kidney   Cancer Daughter        Uterine   Cancer Other        [place] Clinical cytogeneticist Son): Prostate    SOCIAL HISTORY:   Social History   Tobacco Use   Smoking status: Former    Current packs/day: 0.00    Average packs/day: 1 pack/day for 35.0 years (35.0 ttl pk-yrs)    Types: Cigarettes    Start date: [birth year]/07/1949    Quit date: 03/06/1986    Years since quitting: 37.2   Smokeless tobacco: Former    Types: Chew    Quit date: 1985   Tobacco comments:    Started smoking around 88 yrs old.    Smoked 1 PPD at his heaviest.  Vaping Use   Vaping status:  Never Used  Substance Use Topics   Alcohol use: No   Drug use: No    ALLERGIES:  is allergic to atorvastatin.  MEDICATIONS:  Current Outpatient Medications  Medication Sig Dispense Refill   apixaban (ELIQUIS) 5 MG TABS tablet Take 1 tablet (5 mg total) by mouth 2 (two) times daily. 180 tablet 3   Calcium Carb-Cholecalciferol (CALCIUM + D3 PO) Take 1 tablet by mouth daily.     cholecalciferol (VITAMIN D) 1000 units tablet Take 1,000 Units by mouth daily.      ferrous sulfate 324 MG TBEC Take 324 mg by mouth daily.     furosemide (LASIX) 40 MG tablet TAKE 1 TABLET BY MOUTH EVERY OTHER DAY AS DIRECTED 45 tablet 3   isosorbide mononitrate (IMDUR) 30 MG 24 hr tablet Take 1 tablet (30 mg total) by mouth daily. 90 tablet 2   Leuprolide Acetate, 3 Month, (ELIGARD) 22.5 MG injection Inject 22.5 mg into the skin every 4 (four) months.     losartan (COZAAR) 25 MG tablet Take 1 tablet (25 mg total) by mouth daily. (Patient taking differently: Take 25 mg by mouth daily. Takes 1/2 tablet every morning) 90 tablet 3   lovastatin (MEVACOR) 40 MG tablet Take 2 tablets (80 mg total) by mouth at bedtime. 180 tablet 1   metoprolol succinate (TOPROL-XL) 25 MG 24 hr tablet TAKE 1 TABLET BY MOUTH ONCE DAILY 90 tablet 1   Multiple Vitamin (MULTI-VITAMINS) TABS Take 1 tablet by mouth daily.      pantoprazole (PROTONIX) 40 MG tablet Take 40 mg by mouth 2 (two) times daily.      potassium chloride (KLOR-CON) 10 MEQ tablet TAKE 1 TABLET BY MOUTH EVERY OTHER DAY WITH FUROSEMID 45 tablet 3   No current facility-administered medications for this visit.   Facility-Administered Medications Ordered in Other Visits  Medication Dose Route Frequency Provider Last Rate Last Admin   iron sucrose (VENOFER) injection 200 mg  200 mg Intravenous Once Earna Coder, MD        PHYSICAL EXAMINATION: ECOG PERFORMANCE STATUS: 0 - Asymptomatic  BP 117/60 (BP Location: Left Arm, Patient Position: Sitting)   Pulse 76    Temp 97.6 F (36.4 C) (Oral)   Resp 18   Wt 219 lb (99.3 kg)   SpO2 97%   BMI 31.42 kg/m   Filed Weights   05/18/23 1318  Weight: 219 lb (99.3 kg)    Patient in wheelchair; left leg in the brace  Physical  Exam HENT:     Head: Normocephalic and atraumatic.     Mouth/Throat:     Pharynx: No oropharyngeal exudate.  Eyes:     Pupils: Pupils are equal, round, and reactive to light.  Cardiovascular:     Rate and Rhythm: Normal rate and regular rhythm.  Pulmonary:     Effort: No respiratory distress.     Breath sounds: No wheezing.  Abdominal:     General: Bowel sounds are normal. There is no distension.     Palpations: Abdomen is soft. There is no mass.     Tenderness: There is no abdominal tenderness. There is no guarding or rebound.  Musculoskeletal:        General: No tenderness. Normal range of motion.     Cervical back: Normal range of motion and neck supple.  Skin:    General: Skin is warm.  Neurological:     Mental Status: He is alert and oriented to person, place, and time.  Psychiatric:        Mood and Affect: Affect normal.     LABORATORY DATA:  I have reviewed the data as listed    Component Value Date/Time   NA 140 05/16/2023 0919   NA 141 02/04/2021 0810   NA 140 09/16/2013 0438   K 4.2 05/16/2023 0919   K 4.2 09/16/2013 0438   CL 103 05/16/2023 0919   CL 109 (H) 09/16/2013 0438   CO2 27 05/16/2023 0919   CO2 24 09/16/2013 0438   GLUCOSE 103 (H) 05/16/2023 0919   GLUCOSE 101 (H) 09/16/2013 0438   BUN 23 05/16/2023 0919   BUN 18 02/04/2021 0810   BUN 20 (H) 09/16/2013 0438   CREATININE 1.39 05/16/2023 0919   CREATININE 1.34 (H) 05/15/2023 0817   CREATININE 1.28 09/16/2013 0438   CALCIUM 9.6 05/16/2023 0919   CALCIUM 8.3 (L) 09/16/2013 0438   PROT 6.5 05/16/2023 0919   PROT 6.7 09/15/2013 0755   ALBUMIN 4.2 05/16/2023 0919   ALBUMIN 3.5 09/15/2013 0755   AST 18 05/16/2023 0919   AST 21 05/15/2023 0817   ALT 13 05/16/2023 0919   ALT 15  05/15/2023 0817   ALT 28 09/15/2013 0755   ALKPHOS 49 05/16/2023 0919   ALKPHOS 44 (L) 09/15/2013 0755   BILITOT 0.6 05/16/2023 0919   BILITOT 0.9 05/15/2023 0817   GFRNONAA 52 (L) 05/15/2023 0817   GFRNONAA 54 (L) 09/16/2013 0438   GFRAA 59 (L) 10/07/2019 1305   GFRAA >60 09/16/2013 0438    No results found for: "SPEP", "UPEP"  Lab Results  Component Value Date   WBC 4.8 05/15/2023   NEUTROABS 2.8 05/15/2023   HGB 12.2 (L) 05/15/2023   HCT 37.1 (L) 05/15/2023   MCV 90.7 05/15/2023   PLT 177 05/15/2023      Chemistry      Component Value Date/Time   NA 140 05/16/2023 0919   NA 141 02/04/2021 0810   NA 140 09/16/2013 0438   K 4.2 05/16/2023 0919   K 4.2 09/16/2013 0438   CL 103 05/16/2023 0919   CL 109 (H) 09/16/2013 0438   CO2 27 05/16/2023 0919   CO2 24 09/16/2013 0438   BUN 23 05/16/2023 0919   BUN 18 02/04/2021 0810   BUN 20 (H) 09/16/2013 0438   CREATININE 1.39 05/16/2023 0919   CREATININE 1.34 (H) 05/15/2023 0817   CREATININE 1.28 09/16/2013 0438      Component Value Date/Time   CALCIUM 9.6 05/16/2023 0919  CALCIUM 8.3 (L) 09/16/2013 0438   ALKPHOS 49 05/16/2023 0919   ALKPHOS 44 (L) 09/15/2013 0755   AST 18 05/16/2023 0919   AST 21 05/15/2023 0817   ALT 13 05/16/2023 0919   ALT 15 05/15/2023 0817   ALT 28 09/15/2013 0755   BILITOT 0.6 05/16/2023 0919   BILITOT 0.9 05/15/2023 0817       RADIOGRAPHIC STUDIES: I have personally reviewed the radiological images as listed and agreed with the findings in the report. No results found.   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 Stage IV-on Eligard; Bone scan October 2020-improved;  SEP 2023 PSA- <0.1.  Currently getting intermittent Eligard. DEC 2024- PSA <0.01.  Hold Eligard today.  If significantly elevated would recommend Eligard. If PSA rising would consider bone scan.  Stable.   # Iron deficient anemia/CKD- III-[s/p EGD March  2021]--status post IV iron infusion x1 [FEB 2022]-MARCH 2025--hemoglobin is 12.3; ferritin-20 Overall stable.  Continue gentle iron [iron biglycinate; 28 mg ] 1 pill a day.  Venofer today-  # Nov 26th, 2023-acute PE -lobar and segmental PE [[facility]]/ A.fib - on Eliquis [no asprin/plavix]-provoked left foot surgery. However, pt on indefinite Eliquis 5 mg twice daily. Stable.   # TIA-Dec 2024/ [Hx of stroke]- MRI- [facility]- negative for any new stroke; old strokes-awaiing EEG- pending-; again discussed re: bleeding precautions on eliquis.  Stable.    #CKD stage III-GFR 40-50s. Clinically Stable.   Eligard 22.5 q4 m- ? nov 2023.  # DISPOSITION:  # HOLD eligard; #  Venofer today # Follow up in 3 months-  MD; 2-3 days prior- labs- cbc.cmp/iron studies; ferritin; PSA eligard; possible venofer- Dr.B  Cc; Dr.Scott/  Orders Placed This Encounter  Procedures   CBC with Differential (Cancer Center Only)    Standing Status:   Future    Expected Date:   08/17/2023    Expiration Date:   05/17/2024   CMP (Cancer Center only)    Standing Status:   Future    Expected Date:   08/17/2023    Expiration Date:   05/17/2024   Iron and TIBC    Standing Status:   Future    Expected Date:   08/17/2023    Expiration Date:   05/17/2024   Ferritin    Standing Status:   Future    Expected Date:   08/17/2023    Expiration Date:   05/17/2024   PSA    Standing Status:   Future    Expected Date:   08/17/2023    Expiration Date:   05/17/2024   All questions were answered. The patient knows to call the clinic with any problems, questions or concerns.      Earna Coder, MD 05/18/2023 1:47 PM

## 2023-05-18 NOTE — Assessment & Plan Note (Addendum)
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  Currently getting intermittent Eligard. DEC 2024- PSA <0.01.  Hold Eligard today.  If significantly elevated would recommend Eligard. If PSA rising would consider bone scan.  Stable.   # Iron deficient anemia/CKD- III-[s/p EGD March 2021]--status post IV iron infusion x1 [FEB 2022]-MARCH 2025--hemoglobin is 12.3; ferritin-20 Overall stable.  Continue gentle iron [iron biglycinate; 28 mg ] 1 pill a day.  Venofer today-  # Nov 26th, 2023-acute PE -lobar and segmental PE [[facility]]/ A.fib - on Eliquis [no asprin/plavix]-provoked left foot surgery. However, pt on indefinite Eliquis 5 mg twice daily. Stable.   # TIA-Dec 2024/ [Hx of stroke]- MRI- [facility]- negative for any new stroke; old strokes-awaiing EEG- pending-; again discussed re: bleeding precautions on eliquis.  Stable.    #CKD stage III-GFR 40-50s. Clinically Stable.   Eligard 22.5 q4 m- ? nov 2023.  # DISPOSITION:  # HOLD eligard; #  Venofer today # Follow up in 3 months-  MD; 2-3 days prior- labs- cbc.cmp/iron studies; ferritin; PSA eligard; possible venofer- Dr.B  Cc; Dr.Scott/

## 2023-05-18 NOTE — Patient Instructions (Signed)

## 2023-05-22 ENCOUNTER — Ambulatory Visit: Payer: Medicare HMO

## 2023-05-22 ENCOUNTER — Ambulatory Visit: Payer: Medicare HMO | Admitting: Internal Medicine

## 2023-05-28 ENCOUNTER — Other Ambulatory Visit: Payer: Self-pay | Admitting: Cardiovascular Disease

## 2023-06-01 ENCOUNTER — Telehealth: Payer: Self-pay | Admitting: Cardiovascular Disease

## 2023-06-01 NOTE — Telephone Encounter (Signed)
 Wife is calling to see if the patient can get an EKG done. They went to see urology dr on this week and stated the patient needed one done. They believe the patient are having seizures and put him on medication. Please advise

## 2023-06-01 NOTE — Telephone Encounter (Signed)
 I spoke with the patient's wife, who reported that the patient's neurologist started him on lacosamide and is requesting a follow-up EKG in one week. A nurse visit is scheduled for 06/06/23 at 8:30 a.m.

## 2023-06-04 ENCOUNTER — Encounter: Payer: Self-pay | Admitting: Internal Medicine

## 2023-06-06 ENCOUNTER — Ambulatory Visit: Attending: Cardiology | Admitting: Emergency Medicine

## 2023-06-06 VITALS — BP 124/82 | HR 90 | Ht 70.0 in | Wt 227.4 lb

## 2023-06-06 DIAGNOSIS — I480 Paroxysmal atrial fibrillation: Secondary | ICD-10-CM

## 2023-06-06 DIAGNOSIS — R001 Bradycardia, unspecified: Secondary | ICD-10-CM

## 2023-06-06 DIAGNOSIS — Z79899 Other long term (current) drug therapy: Secondary | ICD-10-CM

## 2023-06-06 NOTE — Progress Notes (Signed)
   Nurse Visit   Date of Encounter: 06/06/2023 ID: DONI WIDMER, DOB 23-Jan-1936, MRN [id number]  PCP:  Debbe Odea, MD    [facility] Providers Cardiologist:  Lorine Bears, MD      Visit Details   VS:  BP 124/82 (BP Location: Left Arm, Patient Position: Sitting, Cuff Size: Normal)   Pulse 90   Ht 5\' 10"  (1.778 m)   Wt 227 lb 6.4 oz (103.1 kg)   SpO2 98%   BMI 32.63 kg/m  , BMI Body mass index is 32.63 kg/m.  Wt Readings from Last 3 Encounters:  06/06/23 227 lb 6.4 oz (103.1 kg)  05/18/23 219 lb (99.3 kg)  05/17/23 220 lb (99.8 kg)     Reason for visit: Neurologist requested EKG 1 week after starting lacosamide   Performed today: Vitals, EKG, Provider consulted:Dr. Azucena Cecil, and Education Changes (medications, testing, etc.) : No changes  Length of Visit: 20 minutes  During visit, pt and wife reported "absent seizure" after sitting from laying position. Lasted approximately 2 minutes. Nurse observed pt for 5 mins. Repeat BP 160/89 HR 77.  Dr. Azucena Cecil made aware and reviewed EKG. MD recommended no changes.  Nurse advise pt and wife to contact neurologist to make aware of episode today. Pt ambulatory on leaving.    Medications Adjustments/Labs and Tests Ordered: Orders Placed This Encounter  Procedures   EKG 12-Lead   No orders of the defined types were placed in this encounter.    Signed, Parke Poisson, RN  06/06/2023 9:18 AM

## 2023-06-11 ENCOUNTER — Telehealth: Payer: Self-pay | Admitting: Cardiovascular Disease

## 2023-06-11 NOTE — Telephone Encounter (Signed)
 Patient wife called states that patient had shortness of breath on Friday morning with BP 199/102 and felt weakness. After about an hour BP lowered 157/90 HR 80. Please call to discuss.

## 2023-06-11 NOTE — Telephone Encounter (Signed)
 Called patient, spoke with wife.   Advised that he came in last week to have EKG completed due to starting a new seizure medication. Mentioned having an episode during this visit (see notes in epic). Patient wife states on Wednesday last week he took the trash out and became short of breath when walking back up to the house- he stopped to catch his breath, he had been standing there for a minute before she seen him. They came in, checked BP- it was 199/102. However after waiting an hour it had went down to 157/90 HR 80. She said he has not had any other symptoms since that one day- BP has been okay since then, said it was 130/? (Did not remember bottom number). I advised her to start checking his BP 1-2 hours after medications and keep BP log. Notify us if the blood pressures stay consistently elevated. Patient wife verbalized understanding. Also advised that Neurology does not think he is having seizures, they are not sure what else is going on- she would like Dr.Arida to be made aware.   Notes in epic as well.

## 2023-06-13 ENCOUNTER — Telehealth: Payer: Self-pay | Admitting: Cardiovascular Disease

## 2023-06-13 NOTE — Telephone Encounter (Signed)
 Called patient, advised that her husbands blood pressure is still elevated. See previous message from 06/11/2023. Today 156/92. Continued medication, no changes. Does still get out of breath with activities. Patient scheduled to see NP tomorrow at 8:00 AM, will route to him for [facility].   Patient wife aware of ED precautions.  Patient wife verbalized understanding.

## 2023-06-13 NOTE — Telephone Encounter (Signed)
 Pt c/o BP issue: STAT if pt c/o blurred vision, one-sided weakness or slurred speech.  STAT if BP is GREATER than 180/120 TODAY.  STAT if BP is LESS than 90/60 and SYMPTOMATIC TODAY  1. What is your BP concern?  Elevated BP  2. Have you taken any BP medication today?  Yes  3. What are your last 5 BP readings?  162/76 (this morning about 8:00 am) 156/92 last night- 9:30 pm 136/82 157/90 199/102 (last Friday)  4. Are you having any other symptoms (ex. Dizziness, headache, blurred vision, passed out)? No  Wife Rivka Barbara) stated patient has been having increased BP readings.  Wife stated she called EMS last night and patient's BP was 156/92 but is concerned about patient's high BP reading.

## 2023-06-14 ENCOUNTER — Encounter: Payer: Self-pay | Admitting: Nurse Practitioner

## 2023-06-14 ENCOUNTER — Encounter: Payer: Self-pay | Admitting: Internal Medicine

## 2023-06-14 ENCOUNTER — Ambulatory Visit: Attending: Nurse Practitioner | Admitting: Nurse Practitioner

## 2023-06-14 ENCOUNTER — Ambulatory Visit: Admitting: Internal Medicine

## 2023-06-14 VITALS — BP 120/62 | HR 69 | Ht 70.0 in | Wt 221.2 lb

## 2023-06-14 VITALS — BP 120/62 | HR 69 | Ht 70.0 in

## 2023-06-14 DIAGNOSIS — I498 Other specified cardiac arrhythmias: Secondary | ICD-10-CM

## 2023-06-14 DIAGNOSIS — R001 Bradycardia, unspecified: Secondary | ICD-10-CM

## 2023-06-14 DIAGNOSIS — E785 Hyperlipidemia, unspecified: Secondary | ICD-10-CM

## 2023-06-14 DIAGNOSIS — I2510 Atherosclerotic heart disease of native coronary artery without angina pectoris: Secondary | ICD-10-CM

## 2023-06-14 DIAGNOSIS — I472 Ventricular tachycardia, unspecified: Secondary | ICD-10-CM

## 2023-06-14 DIAGNOSIS — N183 Chronic kidney disease, stage 3 unspecified: Secondary | ICD-10-CM

## 2023-06-14 DIAGNOSIS — Z950 Presence of cardiac pacemaker: Secondary | ICD-10-CM

## 2023-06-14 DIAGNOSIS — R0609 Other forms of dyspnea: Secondary | ICD-10-CM

## 2023-06-14 DIAGNOSIS — I10 Essential (primary) hypertension: Secondary | ICD-10-CM

## 2023-06-14 DIAGNOSIS — I493 Ventricular premature depolarization: Secondary | ICD-10-CM

## 2023-06-14 DIAGNOSIS — I5032 Chronic diastolic (congestive) heart failure: Secondary | ICD-10-CM

## 2023-06-14 DIAGNOSIS — I480 Paroxysmal atrial fibrillation: Secondary | ICD-10-CM | POA: Diagnosis not present

## 2023-06-14 DIAGNOSIS — I495 Sick sinus syndrome: Secondary | ICD-10-CM | POA: Diagnosis not present

## 2023-06-14 LAB — CUP PACEART INCLINIC DEVICE CHECK
Date Time Interrogation Session: 20250410103230
Implantable Lead Connection Status: 753985
Implantable Lead Connection Status: 753985
Implantable Lead Implant Date: 20230130
Implantable Lead Implant Date: 20230130
Implantable Lead Location: 753859
Implantable Lead Location: 753860
Implantable Lead Model: 3830
Implantable Lead Model: 5076
Implantable Pulse Generator Implant Date: 20230130

## 2023-06-14 LAB — PACEMAKER DEVICE OBSERVATION

## 2023-06-14 NOTE — Progress Notes (Signed)
 Patient Care Team: Dellar Fenton, MD as PCP - General (Internal Medicine) Wenona Hamilton, MD as PCP - Cardiology (Cardiology) Wenona Hamilton, MD as Consulting Physician (Cardiology) Gwyn Leos, MD as Consulting Physician (Hematology and Oncology) Prescilla Brod, Ninette Basque, MD (Vascular Surgery) Francina Irish, NP as Nurse Practitioner (Gastroenterology) Marc Senior, MD as Consulting Physician (Pulmonary Disease)   HPI  Nathaniel Thompson is a 88 y.o. male seen in follow-up for episodic dizziness associated with dyspnea with remote history of syncope.  Event recorder suggested a correlation with junctional rhythm. With  history of sinus bradycardia he underwent pacing with anticipation of antiarrhythmic suppression of his junctional rhythm. Significantly improved and at last visit >> added rate response  Had a fall out of a tree stand when the ladder broke, fractured his left ankle and has had left greater than right edema. Course complicated by PE>> anticoagulation    He has a history of coronary artery disease with prior PCI 2003 and 2004 when stents were patent 2022 History of TIA. Intercurrent repeat TIA 11-12/24 ? Seizures, with decreased awareness  [facility]>> VIMPAT ( keppra) started family has not noted really much benefit.  Continues to struggle with dizziness; some additional benefit with pacing somewhat orthostatic   DATE TEST EF    9/19 LHC   No obstructive disease  3/21 Echo   55-60 %    12/22 LHC   LCXst and RCAst patent No obstructive CAD    Date Cr K Hgb LDL  1/23 1.41 4.0 12.5 62 (3/23)   3/24 1.37 4.0 12.3   3/25 1.39 4.5 12.2     Records and Results Reviewed  Past Medical History:  Diagnosis Date   (HFpEF) heart failure with preserved ejection fraction (HCC)    a. 05/2018 Echo: Nl EF, Gr1 DD, sev dil LA, mild MR/TR, mild PAH; b. 05/2019 Echo: EF 55-60%, no rwma, mild LVH. Nl PASP. Mildly dil LA. Triv MR. Mild AoV sclerosis w/o  stenosis. Mildly dil Asc AO (39mm); d. 02/2023 Echo [facility]): EF >55%, nl RV fxn, mild PR.   AAA (abdominal aortic aneurysm) (HCC)    Allergic rhinitis    Barrett's esophagus 10/21/2013   Bone cancer (HCC)    CKD (chronic kidney disease), stage III (HCC)    Colon polyp, hyperplastic    COPD (chronic obstructive pulmonary disease) (HCC)    mild COPD. former smoker   Coronary artery disease    a. 2003 s/p PCI ([facility]); b. 2004 s/p PCI x 2 ([facility]); c. 11/2017 Cath: LM nl, LAD mild dzs, LCX patent stent w/ 30% distal edge restenosis, RCA patent distal stent w/ 30% prox edge stenosis. Nl EF->Med Rx; d. 02/2021 Cath: LM nl, LAD min irregs, D1/2 min irregs, LCX 10p ISR, 32m, RCA 50m/d ISR, EF 65%.   Diverticulosis    Fundic gland polyps of stomach, benign    Gastritis    GERD (gastroesophageal reflux disease)    Hypercholesteremia    Hypertension    PAF (paroxysmal atrial fibrillation) (HCC)    a. CHA2DS2VASc = 7-->eliquis.   Prostate cancer (HCC)    Prostatism    Pulmonary embolism (HCC)    a. 2023 - lobar and segmental PE in setting of RSV.   Reflux esophagitis    Renal stones    Skin cancer    left cheek/lesion excised   Sleep apnea    Tachy-brady syndrome (HCC)    a. 03/2021 s/p MDT MRI compatible DC  PPM (ser # [id number]).   Tachycardia    a. 11/2017 admit w/ tachycardia/? afib-->no afib noted upon review (amio/OAC d/c'd).   TIA (transient ischemic attack)    a. 02/2023 admit [facility] - no stroke on MRI; b. 06/2022 EEG: nl for age.    Past Surgical History:  Procedure Laterality Date   CARDIAC CATHETERIZATION     COLONOSCOPY WITH PROPOFOL N/A 09/13/2015   Procedure: COLONOSCOPY WITH PROPOFOL;  Surgeon: Deveron Fly, MD;  Location: [facility] ENDOSCOPY;  Service: Endoscopy;  Laterality: N/A;   COLONOSCOPY WITH PROPOFOL N/A 05/07/2019   Procedure: COLONOSCOPY WITH PROPOFOL;  Surgeon: Marshall Skeeter, MD;  Location: [facility] ENDOSCOPY;  Service: Endoscopy;  Laterality: N/A;   CORONARY ANGIOPLASTY      ESOPHAGOGASTRODUODENOSCOPY (EGD) WITH PROPOFOL N/A 09/13/2015   Procedure: ESOPHAGOGASTRODUODENOSCOPY (EGD) WITH PROPOFOL;  Surgeon: Deveron Fly, MD;  Location[facility] ENDOSCOPY;  Service: Endoscopy;  Laterality: N/A;   ESOPHAGOGASTRODUODENOSCOPY (EGD) WITH PROPOFOL N/A 12/28/2015   Procedure: ESOPHAGOGASTRODUODENOSCOPY (EGD) WITH PROPOFOL;  Surgeon: Deveron Fly, MD;  Location: [facility] ENDOSCOPY;  Service: Endoscopy;  Laterality: N/A;   ESOPHAGOGASTRODUODENOSCOPY (EGD) WITH PROPOFOL N/A 06/16/2016   Procedure: ESOPHAGOGASTRODUODENOSCOPY (EGD) WITH PROPOFOL;  Surgeon: Deveron Fly, MD;  Location: [facility] ENDOSCOPY;  Service: Endoscopy;  Laterality: N/A;   ESOPHAGOGASTRODUODENOSCOPY (EGD) WITH PROPOFOL N/A 05/07/2019   Procedure: ESOPHAGOGASTRODUODENOSCOPY (EGD) WITH PROPOFOL;  Surgeon: Marshall Skeeter, MD;  Location: [facility] ENDOSCOPY;  Service: Endoscopy;  Laterality: N/A;   EYE SURGERY  2013   CATARACT EXTRACTION   GANGLION CYST EXCISION Right 05/18/2017   Procedure: REMOVAL GANGLION CYST ANKLE;  Surgeon: Anell Baptist, DPM;  Location: [facility] ORS;  Service: Podiatry;  Laterality: Right;   heart cath stent     LEFT HEART CATH AND CORONARY ANGIOGRAPHY Right 12/03/2017   Procedure: Left Heart Cath and Coronary Angiography with possible coronary intervention;  Surgeon: Cherrie Cornwall, MD;  Location: [facility] INVASIVE CV LAB;  Service: Cardiovascular;  Laterality: Right;   PACEMAKER IMPLANT N/A 04/04/2021   Procedure: PACEMAKER IMPLANT;  Surgeon: Verona Goodwill, MD;  Location: [facility] INVASIVE CV LAB;  Service: Cardiovascular;  Laterality: N/A;   PROSTATE BIOPSY     RIGHT/LEFT HEART CATH AND CORONARY ANGIOGRAPHY N/A 02/07/2021   Procedure: RIGHT/LEFT HEART CATH AND CORONARY ANGIOGRAPHY;  Surgeon: Wenona Hamilton, MD;  Location: [facility] INVASIVE CV LAB;  Service: Cardiovascular;  Laterality: N/A;   stents     multiple    Current Meds  Medication Sig   apixaban (ELIQUIS) 5 MG TABS tablet Take 1 tablet  (5 mg total) by mouth 2 (two) times daily.   Calcium Carb-Cholecalciferol (CALCIUM + D3 PO) Take 1 tablet by mouth daily.   cholecalciferol (VITAMIN D) 1000 units tablet Take 1,000 Units by mouth daily.    ferrous sulfate 324 MG TBEC Take 324 mg by mouth daily.   furosemide (LASIX) 40 MG tablet TAKE 1 TABLET BY MOUTH EVERY OTHER DAY AS DIRECTED   isosorbide mononitrate (IMDUR) 30 MG 24 hr tablet Take 1 tablet (30 mg total) by mouth daily.   Lacosamide 100 MG TABS Take by mouth.   Leuprolide Acetate, 3 Month, (ELIGARD) 22.5 MG injection Inject 22.5 mg into the skin every 4 (four) months.   losartan (COZAAR) 25 MG tablet Take 1 tablet (25 mg total) by mouth daily. (Patient taking differently: Take 25 mg by mouth daily. Takes 1/2 tablet every morning)   lovastatin (MEVACOR) 40 MG tablet Take 2 tablets (80 mg total) by mouth at  bedtime.   metoprolol succinate (TOPROL-XL) 25 MG 24 hr tablet TAKE 1 TABLET BY MOUTH ONCE DAILY   Multiple Vitamin (MULTI-VITAMINS) TABS Take 1 tablet by mouth daily.    pantoprazole (PROTONIX) 40 MG tablet Take 40 mg by mouth 2 (two) times daily.    potassium chloride (KLOR-CON) 10 MEQ tablet TAKE 1 TABLET BY MOUTH EVERY OTHER DAY WITH FUROSEMID    Allergies  Allergen Reactions   Atorvastatin Other (See Comments)    Achy joints      Review of Systems negative except from HPI and PMH  Physical Exam BP 120/62   Pulse 69   Ht 5\' 10"  (1.778 m)   SpO2 96%   BMI 31.74 kg/m  Well developed and well nourished in no acute distress HENT normal Neck supple with JVP-flat Clear Device pocket well healed; without hematoma or erythema.  There is no tethering  Regular rate and rhythm, no   murmur Abd-soft with active BS No Clubbing cyanosis   edema Skin-warm and dry A & Oriented  Grossly normal sensory and motor function  ECG A pacing @ 75 20/10/39  Device function is  normal.   CrCl cannot be calculated (Patient's most recent lab result is older than the  maximum 21 days allowed.).   Assessment and  Plan Pacemaker-[employer]tr[employer]   Dizziness often but not always associated with dyspnea-sometimes positional sometimes not   Ischemic heart disease with prior PCI and in no obstructive lesions 12/22; normal LV function  Sinus bradycardia/chronotropic incompetence   Junctional rhythm--accelerated symptomatic    ATrial fibrillation    Ventricular tachycardia-nonsustained   Ectopic low atrial rhythm   Hypertension   Interval afib  on anticoagulation  Orthostatic VSc changes note evident to explain his dizziness

## 2023-06-14 NOTE — Patient Instructions (Signed)
 Medication Instructions:   Your Physician recommend you continue on your current medication as directed.     *If you need a refill on your cardiac medications before your next appointment, please call your pharmacy*  Lab Work: None ordered. If you have labs (blood work) drawn today and your tests are completely normal, you will receive your results only by: MyChart Message (if you have MyChart) OR A paper copy in the mail If you have any lab test that is abnormal or we need to change your treatment, we will call you to review the results.  Testing/Procedures:    Please report to Radiology at the [facility] Main Entrance 30 minutes early for your test.  [address] [place], [place] [address]                         OR   Please report to Radiology at [facility] Main Entrance, medical mall, 30 mins prior to your test.  [address]  [place], [place]  How to Prepare for Your Cardiac PET/CT Stress Test:  Nothing to eat or drink, except water, 3 hours prior to arrival time.  NO caffeine/decaffeinated products, or chocolate 12 hours prior to arrival. (Please note decaffeinated beverages (teas/coffees) still contain caffeine).  If you have caffeine within 12 hours prior, the test will need to be rescheduled.  Medication instructions: Do not take erectile dysfunction medications for 72 hours prior to test (sildenafil, tadalafil) Do not take nitrates (isosorbide mononitrate, Ranexa) the day before or day of test Do not take tamsulosin the day before or morning of test Hold theophylline containing medications for 12 hours. Hold Dipyridamole 48 hours prior to the test.  Diabetic Preparation: If able to eat breakfast prior to 3 hour fasting, you may take all medications, including your insulin. Do not worry if you miss your breakfast dose of insulin - start at your next meal. If you do not eat prior to 3 hour fast-Hold all diabetes (oral and  insulin) medications. Patients who wear a continuous glucose monitor MUST remove the device prior to scanning.  You may take your remaining medications with water.  NO perfume, cologne or lotion on chest or abdomen area. FEMALES - Please avoid wearing dresses to this appointment.  Total time is 1 to 2 hours; you may want to bring reading material for the waiting time.  IF YOU THINK YOU MAY BE PREGNANT, OR ARE NURSING PLEASE INFORM THE TECHNOLOGIST.  In preparation for your appointment, medication and supplies will be purchased.  Appointment availability is limited, so if you need to cancel or reschedule, please call the Radiology Department Scheduler at [phone] 24 hours in advance to avoid a cancellation fee of $100.00  What to Expect When you Arrive:  Once you arrive and check in for your appointment, you will be taken to a preparation room within the Radiology Department.  A technologist or Nurse will obtain your medical history, verify that you are correctly prepped for the exam, and explain the procedure.  Afterwards, an IV will be started in your arm and electrodes will be placed on your skin for EKG monitoring during the stress portion of the exam. Then you will be escorted to the PET/CT scanner.  There, staff will get you positioned on the scanner and obtain a blood pressure and EKG.  During the exam, you will continue to be connected to the EKG and blood pressure machines.  A small, safe amount of a radioactive tracer will be injected in your IV to obtain a series of pictures of your heart along with an injection of a stress agent.    After your Exam:  It is recommended that you eat a meal and drink a caffeinated beverage to counter act any effects of the stress agent.  Drink plenty of fluids for the remainder of the day and urinate frequently for the first couple of hours after the exam.  Your doctor will inform you of your test results within 7-10 business days.  For more  information and frequently asked questions, please visit our website: [email or web address]  For questions about your test or how to prepare for your test, please call: Cardiac Imaging Nurse Navigators Office: [phone]   Follow-Up: At [facility], you and your health needs are our priority.  As part of our continuing mission to provide you with exceptional heart care, our providers are all part of one team.  This team includes your primary Cardiologist (physician) and Advanced Practice Providers or APPs (Physician Assistants and Nurse Practitioners) who all work together to provide you with the care you need, when you need it.  Your next appointment:   1 month(s)  Provider:   Lorine Bears, MD or Nicolasa Ducking, NP    We recommend signing up for the patient portal called "MyChart".  Sign up information is provided on this After Visit Summary.  MyChart is used to connect with patients for Virtual Visits (Telemedicine).  Patients are able to view lab/test results, encounter notes, upcoming appointments, etc.  Non-urgent messages can be sent to your provider as well.   To learn more about what you can do with MyChart, go to [email or web address].

## 2023-06-14 NOTE — Progress Notes (Signed)
 12

## 2023-06-14 NOTE — Patient Instructions (Signed)
 Medication Instructions:  Your physician recommends that you continue on your current medications as directed. Please refer to the Current Medication list given to you today.  *If you need a refill on your cardiac medications before your next appointment, please call your pharmacy*  Lab Work: None ordered.  If you have labs (blood work) drawn today and your tests are completely normal, you will receive your results only by: MyChart Message (if you have MyChart) OR A paper copy in the mail If you have any lab test that is abnormal or we need to change your treatment, we will call you to review the results.  Testing/Procedures: None ordered.   Follow-Up: At [facility], you and your health needs are our priority.  As part of our continuing mission to provide you with exceptional heart care, our providers are all part of one team.  This team includes your primary Cardiologist (physician) and Advanced Practice Providers or APPs (Physician Assistants and Nurse Practitioners) who all work together to provide you with the care you need, when you need it.  Your next appointment:   12 months

## 2023-06-14 NOTE — Progress Notes (Signed)
 Office Visit    Patient Name: Nathaniel Thompson Date of Encounter: 06/14/2023  Primary Care Provider:  Dale Webb, MD Primary Cardiologist:  Lorine Bears, MD  Chief Complaint    88 y.o. 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  Past Medical History  Subjective   Past Medical History:  Diagnosis Date   (HFpEF) heart failure with preserved ejection fraction (HCC)    a. 05/2018 Echo: Nl EF, Gr1 DD, sev dil LA, mild MR/TR, mild PAH; b. 05/2019 Echo: EF 55-60%, no rwma, mild LVH. Nl PASP. Mildly dil LA. Triv MR. Mild AoV sclerosis w/o stenosis. Mildly dil Asc AO (39mm); d. 02/2023 Echo [facility]): EF >55%, nl RV fxn, mild PR.   AAA (abdominal aortic aneurysm) (HCC)    Allergic rhinitis    Barrett's esophagus 10/21/2013   Bone cancer (HCC)    CKD (chronic kidney disease), stage III (HCC)    Colon polyp, hyperplastic    COPD (chronic obstructive pulmonary disease) (HCC)    mild COPD. former smoker   Coronary artery disease    a. 2003 s/p PCI ([facility]); b. 2004 s/p PCI x 2 ([facility]); c. 11/2017 Cath: LM nl, LAD mild dzs, LCX patent stent w/ 30% distal edge restenosis, RCA patent distal stent w/ 30% prox edge stenosis. Nl EF->Med Rx; d. 02/2021 Cath: LM nl, LAD min irregs, D1/2 min irregs, LCX 10p ISR, 70m, RCA 75m/d ISR, EF 65%.   Diverticulosis    Fundic gland polyps of stomach, benign    Gastritis    GERD (gastroesophageal reflux disease)    Hypercholesteremia    Hypertension    PAF (paroxysmal atrial fibrillation) (HCC)    a. CHA2DS2VASc = 7-->eliquis.   Prostate  cancer (HCC)    Prostatism    Pulmonary embolism (HCC)    a. 2023 - lobar and segmental PE in setting of RSV.   Reflux esophagitis    Renal stones    Skin cancer    left cheek/lesion excised   Sleep apnea    Tachy-brady syndrome (HCC)    a. 03/2021 s/p MDT MRI compatible DC PPM (ser # [id number]).   Tachycardia    a. 11/2017 admit w/ tachycardia/? afib-->no afib noted upon review (amio/OAC d/c'd).   TIA (transient ischemic attack)    a. 02/2023 admit [facility] - no stroke on MRI; b. 06/2022 EEG: nl for age.   Past Surgical History:  Procedure Laterality Date   CARDIAC CATHETERIZATION     COLONOSCOPY WITH PROPOFOL N/A 09/13/2015   Procedure: COLONOSCOPY WITH PROPOFOL;  Surgeon: Christena Deem, MD;  Location: [facility] ENDOSCOPY;  Service: Endoscopy;  Laterality: N/A;   COLONOSCOPY WITH PROPOFOL N/A 05/07/2019   Procedure: COLONOSCOPY WITH PROPOFOL;  Surgeon: Earline Mayotte, MD;  Location: [facility] ENDOSCOPY;  Service: Endoscopy;  Laterality: N/A;   CORONARY ANGIOPLASTY     ESOPHAGOGASTRODUODENOSCOPY (EGD) WITH PROPOFOL N/A 09/13/2015   Procedure: ESOPHAGOGASTRODUODENOSCOPY (EGD) WITH PROPOFOL;  Surgeon: Christena Deem, MD;  Location: [facility] ENDOSCOPY;  Service: Endoscopy;  Laterality: N/A;   ESOPHAGOGASTRODUODENOSCOPY (EGD) WITH PROPOFOL N/A 12/28/2015   Procedure: ESOPHAGOGASTRODUODENOSCOPY (EGD) WITH PROPOFOL;  Surgeon: Christena Deem, MD;  Location: [facility] ENDOSCOPY;  Service:  Endoscopy;  Laterality: N/A;   ESOPHAGOGASTRODUODENOSCOPY (EGD) WITH PROPOFOL N/A 06/16/2016   Procedure: ESOPHAGOGASTRODUODENOSCOPY (EGD) WITH PROPOFOL;  Surgeon: Christena Deem, MD;  Location: [facility] ENDOSCOPY;  Service: Endoscopy;  Laterality: N/A;   ESOPHAGOGASTRODUODENOSCOPY (EGD) WITH PROPOFOL N/A 05/07/2019   Procedure: ESOPHAGOGASTRODUODENOSCOPY (EGD) WITH PROPOFOL;  Surgeon: Earline Mayotte, MD;  Location: [facility] ENDOSCOPY;  Service: Endoscopy;  Laterality: N/A;   EYE SURGERY  2013   CATARACT EXTRACTION    GANGLION CYST EXCISION Right 05/18/2017   Procedure: REMOVAL GANGLION CYST ANKLE;  Surgeon: Gwyneth Revels, DPM;  Location: [facility] ORS;  Service: Podiatry;  Laterality: Right;   heart cath stent     LEFT HEART CATH AND CORONARY ANGIOGRAPHY Right 12/03/2017   Procedure: Left Heart Cath and Coronary Angiography with possible coronary intervention;  Surgeon: Laurier Nancy, MD;  Location: [facility] INVASIVE CV LAB;  Service: Cardiovascular;  Laterality: Right;   PACEMAKER IMPLANT N/A 04/04/2021   Procedure: PACEMAKER IMPLANT;  Surgeon: [facility] Salvia, MD;  Location: [facility] INVASIVE CV LAB;  Service: Cardiovascular;  Laterality: N/A;   PROSTATE BIOPSY     RIGHT/LEFT HEART CATH AND CORONARY ANGIOGRAPHY N/A 02/07/2021   Procedure: RIGHT/LEFT HEART CATH AND CORONARY ANGIOGRAPHY;  Surgeon: Iran Ouch, MD;  Location: [facility] INVASIVE CV LAB;  Service: Cardiovascular;  Laterality: N/A;   stents     multiple    Allergies  Allergies  Allergen Reactions   Atorvastatin Other (See Comments)    Achy joints      History of Present Illness      88 y.o. 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    He previously underwent stenting of the left circumflex and RCA with initial PCI performed at [facility] in 2003 followed by repeat PCI x2 at [facility] in 2004.  Catheterization in 2019 showed a patent circumflex stent with 30% distal edge restenosis as well as a patent distal RCA stent with 30% proximal stenosis.  EF was normal.  In September 2019, he was also hospitalized with tachycardia with question of atrial fibrillation.  He was initially placed on amiodarone  and anticoagulation but all of his EKGs revealed sinus rhythm at the time and thus amiodarone and Eliquis were discontinued.  He had worsening chest pain and dyspnea in December 2002 prompted diagnostic catheterization which showed patent stents in the left circumflex and RCA, with no other obstructive disease.  Right heart catheter mildly elevated filling pressures with mild pulmonary hypertension and normal cardiac output.  During catheterization, he was noted to have intermittent bradycardia with drops in heart rate to the 40s.  He subsequently underwent outpatient monitoring which showed frequent episodes of SVT.  He was seen by electrophysiology in January 2023 in the setting of tachybradycardia syndrome, he underwent permanent pacemaker placement.  In late 2023, he fell from a deer stand and fractured his left posterior calcaneus.  He was subsequently hospitalized at [facility] with dyspnea, cough, and fever in the setting of displaced fracture of the left posterior calcaneus.  He was found to have RSV as well as lobar and segmental pulmonary emboli.  He was treated with anticoagulation and during hospitalization was also noted to have episodes of tachycardia and atrial fibrillation with rapid ventricular  response necessitating initiation of metoprolol therapy.  In December 2024, he was admitted to [facility] with slurred speech, right arm weakness, and suspected stroke.  tPA was not given secondary to background Eliquis therapy.  He was hypertensive on presentation.  MRI of the brain showed no acute stroke.  Echo showed normal LV function without significant valvular disease.  Ultimately it was felt that this presentation represented a TIA.   At cardiology office follow-up in February 2025, patient reported intermittent episodes of dizziness, vertigo, and reduced responsiveness lasting up to 10 to 15 minutes.  Pacemaker interrogation was unremarkable.  Losartan was reduced to 25 mg daily given somewhat soft blood  pressure of 110/68.  It was noted that amlodipine was previously discontinued for low blood pressures.  He subsequently underwent EEG at [facility], which was normal for age.  Since his last visit, he has noted some progression in dyspnea on exertion.  He and his wife go to the gym and whereas he used to be able to walk about 6 laps around the track there 6 or more months ago, he can only walk 2 laps before having to take a break due to dyspnea.  Last Friday, he was walking back to his home from his mailbox and became very short of breath and noted mild chest pressure.  His wife checked his blood pressure when he came up the steps and he was initially in the 190s but this subsequently improved with rest and symptoms resolved within 10 to 15 minutes.  He had another episode of dyspnea on exertion while showering 2 nights ago.  Blood pressure was again elevated.  His wife called EMS and pressures were in the 150s but subsequently normalized with rest and the symptoms resolved.  Pressures of otherwise been trending in the 120-130 range at home.  He is 120/62 today.  He has not had any recurrent chest pressure since last week.  He denies palpitations, PND, orthopnea, dizziness, syncope, or early satiety.  He sometimes notes mild ankle swelling. Objective  Home Medications    Current Outpatient Medications  Medication Sig Dispense Refill   apixaban (ELIQUIS) 5 MG TABS tablet Take 1 tablet (5 mg total) by mouth 2 (two) times daily. 180 tablet 3   Calcium Carb-Cholecalciferol (CALCIUM + D3 PO) Take 1 tablet by mouth daily.     cholecalciferol (VITAMIN D) 1000 units tablet Take 1,000 Units by mouth daily.      ferrous sulfate 324 MG TBEC Take 324 mg by mouth daily.     furosemide (LASIX) 40 MG tablet TAKE 1 TABLET BY MOUTH EVERY OTHER DAY AS DIRECTED 45 tablet 3   isosorbide mononitrate (IMDUR) 30 MG 24 hr tablet Take 1 tablet (30 mg total) by mouth daily. 90 tablet 2   Lacosamide 100 MG TABS Take by mouth.      Leuprolide Acetate, 3 Month, (ELIGARD) 22.5 MG injection Inject 22.5 mg into the skin every 4 (four) months.     losartan (COZAAR) 25 MG tablet Take 1 tablet (25 mg total) by mouth daily. (Patient taking differently: Take 25 mg by mouth daily. Takes 1/2 tablet every morning) 90 tablet 3   lovastatin (MEVACOR) 40 MG tablet Take 2 tablets (80 mg total) by mouth at bedtime. 180 tablet 1   metoprolol succinate (TOPROL-XL) 25 MG 24 hr tablet TAKE 1 TABLET BY MOUTH ONCE DAILY 90 tablet 3   Multiple Vitamin (MULTI-VITAMINS) TABS Take 1 tablet by mouth daily.      pantoprazole (PROTONIX)  40 MG tablet Take 40 mg by mouth 2 (two) times daily.      potassium chloride (KLOR-CON) 10 MEQ tablet TAKE 1 TABLET BY MOUTH EVERY OTHER DAY WITH FUROSEMID 45 tablet 3   No current facility-administered medications for this visit.     Physical Exam    VS:  BP 120/62   Pulse 69   Ht 5\' 10"  (1.778 m)   Wt 221 lb 3.2 oz (100.3 kg)   SpO2 96%   BMI 31.74 kg/m  , BMI Body mass index is 31.74 kg/m.       GEN: Obese, in no acute distress. HEENT: normal. Neck: Supple, no JVD, carotid bruits, or masses. Cardiac: RRR, no murmurs, rubs, or gallops. No clubbing, cyanosis, trace bilateral ankle edema.  Radials 2+/PT 2+ and equal bilaterally.  Respiratory:  Respirations regular and unlabored, clear to auscultation bilaterally. GI: Soft, nontender, nondistended, BS + x 4. MS: no deformity or atrophy. Skin: warm and dry, no rash. Neuro:  Strength and sensation are intact. Psych: Normal affect.  Accessory Clinical Findings    ECG personally reviewed by me today - EKG Interpretation Date/Time:  Thursday June 14 2023 08:36:52 EDT Ventricular Rate:  75 PR Interval:  198 QRS Duration:  90 QT Interval:  400 QTC Calculation: 446 R Axis:   90  Text Interpretation: Atrial-paced rhythm Rightward axis Confirmed by Nicolasa Ducking [phone]) on 06/14/2023 8:45:07 AM  - no acute changes.  Lab Results  Component Value  Date   WBC 4.8 05/15/2023   HGB 12.2 (L) 05/15/2023   HCT 37.1 (L) 05/15/2023   MCV 90.7 05/15/2023   PLT 177 05/15/2023   Lab Results  Component Value Date   CREATININE 1.39 05/16/2023   BUN 23 05/16/2023   NA 140 05/16/2023   K 4.2 05/16/2023   CL 103 05/16/2023   CO2 27 05/16/2023   Lab Results  Component Value Date   ALT 13 05/16/2023   AST 18 05/16/2023   ALKPHOS 49 05/16/2023   BILITOT 0.6 05/16/2023   Lab Results  Component Value Date   CHOL 143 05/16/2023   HDL 38.50 (L) 05/16/2023   LDLCALC 80 05/16/2023   TRIG 120.0 05/16/2023   CHOLHDL 4 05/16/2023    Lab Results  Component Value Date   HGBA1C 5.7 05/16/2023   Lab Results  Component Value Date   TSH 2.19 05/16/2023       Assessment & Plan    1.  Dyspnea on exertion/CAD: Patient with prior history of circumflex and RCA stenting, both of which were patent on catheterization in 2022.  Recent echocardiogram in December 2024 at [facility] showed normal LV function without significant valvular disease.  Over the past 6 months, he has noted a decline in activity tolerance and last week had an episode of dyspnea on exertion and mild chest pressure when walking from his mailbox back to his home.  He had another episode of dyspnea while showering on this past Monday night, which resolved with rest.  Blood pressures were elevated following episodes but have otherwise been trending normally when he feels well.  No changes to blood pressure medications today.  I will arrange for Lexiscan PET/CT to rule out ischemia.  Continue beta-blocker, ARB, statin, and long-acting nitrate.  No aspirin in the setting of Eliquis.  Informed Consent   Shared Decision Making/Informed Consent The risks [chest pain, shortness of breath, cardiac arrhythmias, dizziness, blood pressure fluctuations, myocardial infarction, stroke/transient ischemic attack, nausea, vomiting, allergic reaction, radiation  exposure, metallic taste sensation and  life-threatening complications (estimated to be 1 in 10,000)], benefits (risk stratification, diagnosing coronary artery disease, treatment guidance) and alternatives of a cardiac PET stress test were discussed in detail with Mr. Kurtz and he agrees to proceed.    2.  Primary hypertension: In the setting of soft blood pressures, amlodipine was previously discontinued and at his last visit in February, his losartan dose was reduced to 25 mg daily.  Pressures at home are typically in the 120s to 130s but when he feels poorly and his wife checks his blood pressure, pressures are in the 150s and above.  Pressure is 120/62 today.  He does have a history of intermittent lightheadedness that sometimes occurs randomly but also occurs if he stands up too quickly.  Also with a history of sudden loss of responsiveness and absence like events, not felt to be seizures.  With normal blood pressures when he is feeling well, I suspect hypertensive episodes are more likely a response to feeling poorly/being dyspneic or lightheaded, as opposed to the cause of the symptoms.  No changes to medicines today.  3.  Hyperlipidemia: He remains on statin therapy.  LDL had trended below 70 for many years but March recording was elevated at 80.  He remains on lovastatin therapy.  Follow-up stress testing as above and provided that this is normal, would strongly encourage more frequent exercise.  Recommend dietary adjustments-low fat/low cholesterol/increased fruits and vegetables.  4.  Chronic HFpEF: Patient has noted increased dyspnea on exertion over the past 6 months.  His weight has been relatively stable over that timeframe.  Echo in December with normal LV function.  He has only trace ankle edema on examination, and remains on Lasix 40 mg every other day.  Heart rate and blood pressure stable.  No changes to medicines today.  5.  Tachybradycardia syndrome/symptomatic bradycardia: Status post permanent pacemaker placement.  He  has follow-up with Dr. Graciela Husbands today.  6.  Paroxysmal atrial fibrillation: Maintaining sinus rhythm (atrial paced today) on beta-blocker and Eliquis therapy.  7.  TIA/absence episodes: Followed by neurology.  Wife unsure if maybe 1 or 2 episodes over the past 2 months.  Recent EEG was normal.  Prior device interrogations did not show any significant events to account for symptoms.  8.  Abdominal aortic aneurysm: Less than 4 cm polyp followed by vascular surgery.  Blood pressure stable today.  9.  Disposition: Follow-up Lexiscan PET/CT.  Follow-up in clinic in 1 month or sooner if necessary.  Nicolasa Ducking, NP 06/14/2023, 8:48 AM

## 2023-07-04 ENCOUNTER — Encounter: Payer: Self-pay | Admitting: Internal Medicine

## 2023-07-10 ENCOUNTER — Ambulatory Visit (INDEPENDENT_AMBULATORY_CARE_PROVIDER_SITE_OTHER): Payer: Medicare HMO

## 2023-07-10 ENCOUNTER — Encounter (HOSPITAL_COMMUNITY): Payer: Self-pay

## 2023-07-10 DIAGNOSIS — R001 Bradycardia, unspecified: Secondary | ICD-10-CM

## 2023-07-10 LAB — CUP PACEART REMOTE DEVICE CHECK
Battery Remaining Longevity: 135 mo
Battery Voltage: 3.02 V
Brady Statistic AP VP Percent: 0.12 %
Brady Statistic AP VS Percent: 88.98 %
Brady Statistic AS VP Percent: 0.03 %
Brady Statistic AS VS Percent: 10.88 %
Brady Statistic RA Percent Paced: 89.47 %
Brady Statistic RV Percent Paced: 0.14 %
Date Time Interrogation Session: 20250506003325
Implantable Lead Connection Status: 753985
Implantable Lead Connection Status: 753985
Implantable Lead Implant Date: 20230130
Implantable Lead Implant Date: 20230130
Implantable Lead Location: 753859
Implantable Lead Location: 753860
Implantable Lead Model: 3830
Implantable Lead Model: 5076
Implantable Pulse Generator Implant Date: 20230130
Lead Channel Impedance Value: 323 Ohm
Lead Channel Impedance Value: 418 Ohm
Lead Channel Impedance Value: 475 Ohm
Lead Channel Impedance Value: 570 Ohm
Lead Channel Pacing Threshold Amplitude: 0.875 V
Lead Channel Pacing Threshold Amplitude: 0.875 V
Lead Channel Pacing Threshold Pulse Width: 0.4 ms
Lead Channel Pacing Threshold Pulse Width: 0.4 ms
Lead Channel Sensing Intrinsic Amplitude: 1.625 mV
Lead Channel Sensing Intrinsic Amplitude: 1.625 mV
Lead Channel Sensing Intrinsic Amplitude: 2.5 mV
Lead Channel Sensing Intrinsic Amplitude: 2.5 mV
Lead Channel Setting Pacing Amplitude: 1.75 V
Lead Channel Setting Pacing Amplitude: 2 V
Lead Channel Setting Pacing Pulse Width: 0.4 ms
Lead Channel Setting Sensing Sensitivity: 0.9 mV
Zone Setting Status: 755011

## 2023-07-12 ENCOUNTER — Encounter
Admission: RE | Admit: 2023-07-12 | Discharge: 2023-07-12 | Disposition: A | Discharge status: Home / Self Care | Source: Ambulatory Visit | Attending: Nurse Practitioner | Admitting: Nurse Practitioner

## 2023-07-12 DIAGNOSIS — I2510 Atherosclerotic heart disease of native coronary artery without angina pectoris: Secondary | ICD-10-CM | POA: Diagnosis not present

## 2023-07-12 DIAGNOSIS — R0609 Other forms of dyspnea: Secondary | ICD-10-CM | POA: Diagnosis present

## 2023-07-12 LAB — NM PET CT CARDIAC PERFUSION MULTI W/ABSOLUTE BLOODFLOW
LV dias vol: 74 mL (ref 62–150)
LV sys vol: 33 mL
MBFR: 1.68
Nuc Rest EF: 55 %
Nuc Stress EF: 59 %
Peak HR: 75 {beats}/min
Rest HR: 75 {beats}/min
Rest MBF: 0.76 ml/g/min
Rest Nuclear Isotope Dose: 24.9 mCi
SRS: 2
SSS: 6
ST Depression (mm): 0 mm
Stress MBF: 1.28 ml/g/min
Stress Nuclear Isotope Dose: 25 mCi
TID: 1.26

## 2023-07-12 MED ORDER — RUBIDIUM RB82 GENERATOR (RUBYFILL)
25.0000 | PACK | Freq: Once | INTRAVENOUS | Status: AC
  Administered 2023-07-12: 24.94 via INTRAVENOUS

## 2023-07-12 MED ORDER — REGADENOSON 0.4 MG/5ML IV SOLN
0.4000 mg | Freq: Once | INTRAVENOUS | Status: AC
  Administered 2023-07-12: 0.4 mg via INTRAVENOUS
  Filled 2023-07-12: qty 5

## 2023-07-12 MED ORDER — RUBIDIUM RB82 GENERATOR (RUBYFILL)
25.0000 | PACK | Freq: Once | INTRAVENOUS | Status: AC
  Administered 2023-07-12: 24.96 via INTRAVENOUS

## 2023-07-12 MED ORDER — REGADENOSON 0.4 MG/5ML IV SOLN
INTRAVENOUS | Status: AC
Start: 2023-07-12 — End: ?
  Filled 2023-07-12: qty 5

## 2023-07-12 NOTE — Progress Notes (Signed)
 Patient presents for a cardiac PET stress test and tolerated procedure without incident. Patient maintained acceptable vital signs throughout the test and was offered caffeine after test.  Patient ambulated out of department assisted by a cane.

## 2023-07-13 ENCOUNTER — Telehealth: Payer: Self-pay | Admitting: Nurse Practitioner

## 2023-07-13 NOTE — Telephone Encounter (Signed)
 Follow Up:     Wife said they saw patient's Pet Scan results on My Chart She said she needs someone to please call and explain it She said they did not understand the results.

## 2023-07-13 NOTE — Telephone Encounter (Signed)
 Pt's wife, Nathaniel Thompson, contacted via telephone. Informed her that her message was reviewed and that we will contact her once the provider has reviewed the PET Scan.

## 2023-07-19 ENCOUNTER — Ambulatory Visit

## 2023-07-20 ENCOUNTER — Encounter: Payer: Self-pay | Admitting: Nurse Practitioner

## 2023-07-20 ENCOUNTER — Ambulatory Visit: Attending: Nurse Practitioner | Admitting: Nurse Practitioner

## 2023-07-20 VITALS — BP 110/60 | HR 63 | Ht 70.0 in | Wt 220.0 lb

## 2023-07-20 DIAGNOSIS — I480 Paroxysmal atrial fibrillation: Secondary | ICD-10-CM | POA: Diagnosis not present

## 2023-07-20 DIAGNOSIS — I25110 Atherosclerotic heart disease of native coronary artery with unstable angina pectoris: Secondary | ICD-10-CM | POA: Diagnosis not present

## 2023-07-20 DIAGNOSIS — I495 Sick sinus syndrome: Secondary | ICD-10-CM | POA: Diagnosis not present

## 2023-07-20 DIAGNOSIS — I200 Unstable angina: Secondary | ICD-10-CM

## 2023-07-20 DIAGNOSIS — I10 Essential (primary) hypertension: Secondary | ICD-10-CM | POA: Diagnosis not present

## 2023-07-20 DIAGNOSIS — I25119 Atherosclerotic heart disease of native coronary artery with unspecified angina pectoris: Secondary | ICD-10-CM

## 2023-07-20 DIAGNOSIS — E785 Hyperlipidemia, unspecified: Secondary | ICD-10-CM

## 2023-07-20 MED ORDER — ASPIRIN 81 MG PO TBEC: 81.0000 mg | DELAYED_RELEASE_TABLET | Freq: Every day | ORAL | Status: DC

## 2023-07-20 NOTE — Patient Instructions (Signed)
 Medication Instructions:  Your physician recommends that you continue on your current medications as directed. Please refer to the Current Medication list given to you today.  *If you need a refill on your cardiac medications before your next appointment, please call your pharmacy*  Lab Work: Your provider recommends that you have labs drawn today: BMET & CBC  If you have labs (blood work) drawn today and your tests are completely normal, you will receive your results only by: MyChart Message (if you have MyChart) OR A paper copy in the mail If you have any lab test that is abnormal or we need to change your treatment, we will call you to review the results.  Testing/Procedures: See below  Follow-Up: At [facility], you and your health needs are our priority.  As part of our continuing mission to provide you with exceptional heart care, our providers are all part of one team.  This team includes your primary Cardiologist (physician) and Advanced Practice Providers or APPs (Physician Assistants and Nurse Practitioners) who all work together to provide you with the care you need, when you need it.  Your next appointment:   2 week(s) post procedure (5/23)  Provider:   Antionette Kirks, MD or Laneta Pintos, NP    We recommend signing up for the patient portal called "MyChart".  Sign up information is provided on this After Visit Summary.  MyChart is used to connect with patients for Virtual Visits (Telemedicine).  Patients are able to view lab/test results, encounter notes, upcoming appointments, etc.  Non-urgent messages can be sent to your provider as well.   To learn more about what you can do with MyChart, go to [email or web address].   Other Instructions       Cardiac/Peripheral Catheterization   You are scheduled for a Peripheral Angiogram on Friday, May 23 with Dr. Antionette Kirks.  1. Please arrive at the [facility] Entrance of [facility], [address], [place] [address] at 7:30 AM (This is 1 hour(s) prior to your procedure time).  Proceed to the Check-In Desk directly inside the entrance.  Procedure Parking: Use the entrance off of the [address] side of the hospital. Turn right upon entering and follow the driveway to parking that is directly in front of the [facility]. There is no valet parking available at this entrance, however there is an awning directly in front of the [facility] Vascular Center for drop off/ pick up for patients.      Special note: Every effort is made to have your procedure done on time. Please understand that emergencies sometimes delay scheduled procedures.  2. Diet: Do not eat solid foods after midnight.  You may have clear liquids until 5 AM the day of the procedure.  3. Labs: You will need to have blood drawn today (5/16).    4. Medication instructions in preparation for your procedure:  Stop taking Eliquis  (Apixiban) on Tuesday, May 20.  Start taking aspirin  81mg  on Tuesday, May 20.  Stop taking, Fluid pill and potassium  Friday, May 23,    On the morning of your procedure, take Aspirin  81 mg and any morning medicines NOT listed above.  You may use sips of water.  5. Plan to go home the same day, you will only stay overnight if medically necessary. 6. You MUST have a responsible adult to drive you home. 7. An adult MUST be with you the first 24 hours after you arrive home.  8. Bring a current list of your medications, and the last time and date medication taken. 9. Bring ID and current insurance cards. 10.Please wear clothes that are easy to get on and off and wear slip-on shoes.  Thank you for allowing us  to care for you!   -- [facility]

## 2023-07-20 NOTE — Progress Notes (Signed)
 Cardiology Clinic Note   Patient Name: Nathaniel Thompson Date of Encounter: 07/20/2023  Primary Care Provider:  Dellar Fenton, MD Primary Cardiologist:  Antionette Kirks, MD  Patient Profile    88 y.o. 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  Past Medical History    Past Medical History:  Diagnosis Date   (HFpEF) heart failure with preserved ejection fraction (HCC)    a. 05/2018 Echo: Nl EF, Gr1 DD, sev dil LA, mild MR/TR, mild PAH; b. 05/2019 Echo: EF 55-60%, no rwma, mild LVH. Nl PASP. Mildly dil LA. Triv MR. Mild AoV sclerosis w/o stenosis. Mildly dil Asc AO (39mm); d. 02/2023 Echo [facility]): EF >55%, nl RV fxn, mild PR.   AAA (abdominal aortic aneurysm) (HCC)    Allergic rhinitis    Barrett's esophagus 10/21/2013   Bone cancer (HCC)    CKD (chronic kidney disease), stage III (HCC)    Colon polyp, hyperplastic    COPD (chronic obstructive pulmonary disease) (HCC)    mild COPD. former smoker   Coronary artery disease    a. 2003 s/p PCI ([facility]); b. 2004 s/p PCI x 2 ([facility]); c. 11/2017 Cath: LM nl, LAD mild dzs, LCX patent stent w/ 30% distal edge restenosis, RCA patent distal stent w/ 30% prox edge stenosis. Nl EF->Med Rx; d. 02/2021 Cath: LM nl, LAD min irregs, D1/2 min irregs, LCX 10p ISR, 37m, RCA 97m/d ISR, EF 65%; e. 07/2023 PET Stress: antlat and inflat ischemia.   Diverticulosis    Fundic gland polyps of stomach, benign    Gastritis    GERD (gastroesophageal reflux disease)    Hypercholesteremia    Hypertension    PAF (paroxysmal atrial fibrillation)  (HCC)    a. CHA2DS2VASc = 7-->eliquis .   Prostate cancer (HCC)    Prostatism    Pulmonary embolism (HCC)    a. 2023 - lobar and segmental PE in setting of RSV.   Reflux esophagitis    Renal stones    Skin cancer    left cheek/lesion excised   Sleep apnea    Tachy-brady syndrome (HCC)    a. 03/2021 s/p MDT MRI compatible DC PPM (ser # [id number]).   Tachycardia    a. 11/2017 admit w/ tachycardia/? afib-->no afib noted upon review (amio/OAC d/c'd).   TIA (transient ischemic attack)    a. 02/2023 admit [facility] - no stroke on MRI; b. 06/2022 EEG: nl for age.   Past Surgical History:  Procedure Laterality Date   CARDIAC CATHETERIZATION     COLONOSCOPY WITH PROPOFOL  N/A 09/13/2015   Procedure: COLONOSCOPY WITH PROPOFOL ;  Surgeon: Deveron Fly, MD;  Location: [facility] ENDOSCOPY;  Service: Endoscopy;  Laterality: N/A;   COLONOSCOPY WITH PROPOFOL  N/A 05/07/2019   Procedure: COLONOSCOPY WITH PROPOFOL ;  Surgeon: Marshall Skeeter, MD;  Location: [facility] ENDOSCOPY;  Service: Endoscopy;  Laterality: N/A;   CORONARY ANGIOPLASTY     ESOPHAGOGASTRODUODENOSCOPY (EGD) WITH PROPOFOL  N/A 09/13/2015   Procedure: ESOPHAGOGASTRODUODENOSCOPY (EGD) WITH PROPOFOL ;  Surgeon: Deveron Fly, MD;  Location: [facility] ENDOSCOPY;  Service: Endoscopy;  Laterality: N/A;   ESOPHAGOGASTRODUODENOSCOPY (EGD) WITH PROPOFOL  N/A 12/28/2015   Procedure: ESOPHAGOGASTRODUODENOSCOPY (EGD) WITH PROPOFOL ;  Surgeon: Deveron Fly, MD;  Location: [facility] ENDOSCOPY;  Service: Endoscopy;  Laterality: N/A;   ESOPHAGOGASTRODUODENOSCOPY (EGD) WITH PROPOFOL  N/A 06/16/2016   Procedure: ESOPHAGOGASTRODUODENOSCOPY (EGD) WITH PROPOFOL ;  Surgeon: Deveron Fly, MD;  Location: [facility] ENDOSCOPY;  Service: Endoscopy;  Laterality: N/A;   ESOPHAGOGASTRODUODENOSCOPY (EGD) WITH PROPOFOL  N/A 05/07/2019   Procedure: ESOPHAGOGASTRODUODENOSCOPY (EGD) WITH PROPOFOL ;  Surgeon: Marshall Skeeter, MD;  Location: [facility] ENDOSCOPY;  Service: Endoscopy;  Laterality: N/A;    EYE SURGERY  2013   CATARACT EXTRACTION   GANGLION CYST EXCISION Right 05/18/2017   Procedure: REMOVAL GANGLION CYST ANKLE;  Surgeon: Anell Baptist, DPM;  Location: [facility] ORS;  Service: Podiatry;  Laterality: Right;   heart cath stent     LEFT HEART CATH AND CORONARY ANGIOGRAPHY Right 12/03/2017   Procedure: Left Heart Cath and Coronary Angiography with possible coronary intervention;  Surgeon: Cherrie Cornwall, MD;  Location: [facility] INVASIVE CV LAB;  Service: Cardiovascular;  Laterality: Right;   PACEMAKER IMPLANT N/A 04/04/2021   Procedure: PACEMAKER IMPLANT;  Surgeon: Verona Goodwill, MD;  Location: [facility] INVASIVE CV LAB;  Service: Cardiovascular;  Laterality: N/A;   PROSTATE BIOPSY     RIGHT/LEFT HEART CATH AND CORONARY ANGIOGRAPHY N/A 02/07/2021   Procedure: RIGHT/LEFT HEART CATH AND CORONARY ANGIOGRAPHY;  Surgeon: Wenona Hamilton, MD;  Location: [facility] INVASIVE CV LAB;  Service: Cardiovascular;  Laterality: N/A;   stents     multiple    Allergies  Allergies  Allergen Reactions   Atorvastatin Other (See Comments)    Achy joints    History of Present Illness      88 y.o. 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    He previously underwent stenting of the left circumflex and RCA with initial PCI performed at [facility] in 2003 followed by repeat PCI x2 at [facility] in 2004.  Catheterization in 2019 showed a patent circumflex stent with 30% distal edge restenosis as well as a patent distal RCA stent with 30% proximal stenosis.  EF was normal.  In September 2019, he was also hospitalized with tachycardia with question of atrial  fibrillation.  He was initially placed on amiodarone  and anticoagulation but all of his EKGs revealed sinus rhythm at the time and thus amiodarone  and Eliquis  were discontinued.  He had worsening chest pain and dyspnea in December 2002 prompted diagnostic catheterization which showed patent stents in the left circumflex and RCA, with no other obstructive disease.  Right heart catheter mildly elevated filling pressures with mild pulmonary hypertension and normal cardiac output.  During catheterization, he was noted to have intermittent bradycardia with drops in heart rate to the 40s.  He subsequently underwent outpatient monitoring which showed frequent episodes of SVT.  He was seen by electrophysiology in January 2023 in the setting of tachybradycardia syndrome, he underwent permanent pacemaker placement.  In late 2023, he fell from a deer stand and fractured his left posterior calcaneus.  He was subsequently hospitalized at [facility] with dyspnea, cough, and fever in the setting of displaced fracture of the left posterior calcaneus.  He was found to have RSV as well as lobar and segmental pulmonary emboli.  He was treated with anticoagulation and during hospitalization was also noted to have  episodes of tachycardia and atrial fibrillation with rapid ventricular response necessitating initiation of metoprolol  therapy.  In December 2024, he was admitted to [facility] with slurred speech, right arm weakness, and suspected stroke.  tPA was not given secondary to background Eliquis  therapy.  He was hypertensive on presentation.  MRI of the brain showed no acute stroke.  Echo showed normal LV function without significant valvular disease.  Ultimately it was felt that this presentation represented a TIA.  In February 2025, he reported intermittent episodes of dizziness, vertigo, and reduced responsiveness lasting up to 10 to 15 minutes.  Pacemaker interrogation was unremarkable.  Losartan  dose was reduced.  EEG at [facility] was  normal for age.     Patient was seen in April 2025 with complaints of progressive dyspnea on exertion and reduced exercise tolerance.  He was not having chest pain.  Lexiscan  PET stress was performed and showed a medium defect with mid to basal anterolateral and inferolateral ischemia.  Since his last visit, Mr. Dewit has continued to have reduced activity tolerance, mild chest pressure, and dyspnea on exertion.  He thinks it has progressed some.  He denies palpitations, PND, orthopnea, dizziness, syncope, edema, or early satiety.  He is interested in pursuing diagnostic catheterization. Objective   Home Medications    Current Outpatient Medications  Medication Sig Dispense Refill   apixaban  (ELIQUIS ) 5 MG TABS tablet Take 1 tablet (5 mg total) by mouth 2 (two) times daily. 180 tablet 3   aspirin  EC 81 MG tablet Take 1 tablet (81 mg total) by mouth daily. Swallow whole. Begin once you stop Eliquis  prior to your heart cath.     Calcium  Carb-Cholecalciferol  (CALCIUM  + D3 PO) Take 1 tablet by mouth daily.     cholecalciferol  (VITAMIN D ) 1000 units tablet Take 1,000 Units by mouth daily.      ferrous sulfate 324 MG TBEC Take 324 mg by mouth daily.     furosemide  (LASIX ) 40 MG tablet TAKE 1 TABLET BY MOUTH EVERY OTHER DAY AS DIRECTED 45 tablet 3   isosorbide  mononitrate (IMDUR ) 30 MG 24 hr tablet Take 1 tablet (30 mg total) by mouth daily. 90 tablet 2   Lacosamide 100 MG TABS Take by mouth.     Leuprolide  Acetate, 3 Month, (ELIGARD ) 22.5 MG injection Inject 22.5 mg into the skin every 4 (four) months.     losartan  (COZAAR ) 25 MG tablet Take 1 tablet (25 mg total) by mouth daily. (Patient taking differently: Take 25 mg by mouth daily. Takes 1/2 tablet every morning) 90 tablet 3   lovastatin  (MEVACOR ) 40 MG tablet Take 2 tablets (80 mg total) by mouth at bedtime. 180 tablet 1   metoprolol  succinate (TOPROL -XL) 25 MG 24 hr tablet TAKE 1 TABLET BY MOUTH ONCE DAILY 90 tablet 3   Multiple Vitamin  (MULTI-VITAMINS) TABS Take 1 tablet by mouth daily.      pantoprazole  (PROTONIX ) 40 MG tablet Take 40 mg by mouth 2 (two) times daily.      potassium chloride  (KLOR-CON ) 10 MEQ tablet TAKE 1 TABLET BY MOUTH EVERY OTHER DAY WITH FUROSEMID 45 tablet 3   No current facility-administered medications for this visit.     Family History    Family History  Problem Relation Age of Onset   Cancer Mother        gastric and lung   Cancer Father        multiple myeloma   Stroke Father    Cancer Sister  leukemia   Cancer Brother        leukemia   Cancer Brother        kidney   Cancer Daughter        Uterine   Cancer Other        Nephes (Sister's Son): Prostate   He indicated that his mother is deceased. He indicated that his father is deceased. He indicated that both of his sisters are alive. He indicated that both of his brothers are deceased. He indicated that the status of his daughter is unknown. He indicated that the status of his other is unknown.   Social History    Social History   Socioeconomic History   Marital status: Married    Spouse name: Glenda   Number of children: 2   Years of education: Not on file   Highest education level: Not on file  Occupational History   Occupation: retired Production designer, theatre/television/film for [employer]  Tobacco Use   Smoking status: Former    Current packs/day: 0.00    Average packs/day: 1 pack/day for 35.0 years (35.0 ttl pk-yrs)    Types: Cigarettes    Start date: 03/07/1951    Quit date: 03/06/1986    Years since quitting: 37.3   Smokeless tobacco: Former    Types: Chew    Quit date: 1985   Tobacco comments:    Started smoking around 88 yrs old.    Smoked 1 PPD at his heaviest.  Vaping Use   Vaping status: Never Used  Substance and Sexual Activity   Alcohol use: No   Drug use: No   Sexual activity: Not Currently  Other Topics Concern   Not on file  Social History Narrative   Married, [place] Eland 2 step children and 2 children from previous  marriage   Social Drivers of Corporate investment banker Strain: Low Risk  (10/19/2022)   Overall Financial Resource Strain (CARDIA)    Difficulty of Paying Living Expenses: Not hard at all  Food Insecurity: No Food Insecurity (10/19/2022)   Hunger Vital Sign    Worried About Running Out of Food in the Last Year: Never true    Ran Out of Food in the Last Year: Never true  Transportation Needs: No Transportation Needs (10/19/2022)   PRAPARE - Administrator, Civil Service (Medical): No    Lack of Transportation (Non-Medical): No  Physical Activity: Sufficiently Active (10/19/2022)   Exercise Vital Sign    Days of Exercise per Week: 5 days    Minutes of Exercise per Session: 30 min  Stress: No Stress Concern Present (10/19/2022)   [employer] of Occupational Health - Occupational Stress Questionnaire    Feeling of Stress : Not at all  Social Connections: Moderately Integrated (10/19/2022)   Social Connection and Isolation Panel [NHANES]    Frequency of Communication with Friends and Family: More than three times a week    Frequency of Social Gatherings with Friends and Family: Three times a week    Attends Religious Services: More than 4 times per year    Active Member of Clubs or Organizations: No    Attends Banker Meetings: Never    Marital Status: Married  Catering manager Violence: Not At Risk (10/19/2022)   Humiliation, Afraid, Rape, and Kick questionnaire    Fear of Current or Ex-Partner: No    Emotionally Abused: No    Physically Abused: No    Sexually Abused: No     Review  of Systems    General:  No chills, fever, night sweats or weight changes.  Cardiovascular:  +++ Mild exertional chest pressure, +++ dyspnea on exertion, no edema, orthopnea, palpitations, paroxysmal nocturnal dyspnea. Dermatological: No rash, lesions/masses Respiratory: No cough, dyspnea Urologic: No hematuria, dysuria Abdominal:   No nausea, vomiting, diarrhea, bright red  blood per rectum, melena, or hematemesis Neurologic:  No visual changes, wkns, changes in mental status. All other systems reviewed and are otherwise negative except as noted above.     Physical Exam    VS:  BP 110/60 (BP Location: Left Arm, Patient Position: Sitting, Cuff Size: Normal)   Pulse 63   Ht 5\' 10"  (1.778 m)   Wt 220 lb (99.8 kg)   SpO2 95%   BMI 31.57 kg/m  , BMI Body mass index is 31.57 kg/m.     GEN: Well nourished, well developed, in no acute distress. HEENT: normal. Neck: Supple, no JVD, carotid bruits, or masses. Cardiac: RRR, no murmurs, rubs, or gallops. No clubbing, cyanosis, trace bilateral ankle edema.  Radials 2+/PT 2+ and equal bilaterally.  Respiratory:  Respirations regular and unlabored, clear to auscultation bilaterally. GI: Soft, nontender, nondistended, BS + x 4. MS: no deformity or atrophy. Skin: warm and dry, no rash. Neuro:  Strength and sensation are intact. Psych: Normal affect.  Accessory Clinical Findings    ECG personally reviewed by me today- EKG Interpretation Date/Time:  Friday Jul 20 2023 08:01:08 EDT Ventricular Rate:  63 PR Interval:  200 QRS Duration:  96 QT Interval:  390 QTC Calculation: 399 R Axis:   -24  Text Interpretation: Atrial-paced rhythm with occasional Premature ventricular complexes Nonspecific T wave abnormality Confirmed by Laneta Pintos [phone]) on 07/20/2023 8:18:30 AM   - No acute changes  Lab Results  Component Value Date   WBC 4.8 05/15/2023   HGB 12.2 (L) 05/15/2023   HCT 37.1 (L) 05/15/2023   MCV 90.7 05/15/2023   PLT 177 05/15/2023   Lab Results  Component Value Date   CREATININE 1.39 05/16/2023   BUN 23 05/16/2023   NA 140 05/16/2023   K 4.2 05/16/2023   CL 103 05/16/2023   CO2 27 05/16/2023   Lab Results  Component Value Date   ALT 13 05/16/2023   AST 18 05/16/2023   ALKPHOS 49 05/16/2023   BILITOT 0.6 05/16/2023   Lab Results  Component Value Date   CHOL 143 05/16/2023   HDL  38.50 (L) 05/16/2023   LDLCALC 80 05/16/2023   TRIG 120.0 05/16/2023   CHOLHDL 4 05/16/2023    Lab Results  Component Value Date   HGBA1C 5.7 05/16/2023      Assessment & Plan   1.  Unstable angina/coronary artery disease/abnormal stress test: Patient with prior history of circumflex and RCA stenting both of which were patent on catheterization in 2022.  Over the past 7 months, he has had progressive decline in activity tolerance, dyspnea on exertion, and mild chest pressure.  We obtained a Lexiscan  PET/CT following his last visit which showed a medium size defect with mid to basal anterolateral and inferolateral ischemia.  Results of testing discussed today.  In the setting of abnormal stress test with unstable angina, we will pursue diagnostic catheterization with Dr. Alvenia Aus next week.  CBC and basic metabolic panel today.  Patient will hold Eliquis  for 3 days prior to catheterization and start taking aspirin  81 mg daily upon holding Eliquis .  Continue beta-blocker, ARB, and nitrate. Informed Consent   Shared Decision  Making/Informed Consent The risks [stroke (1 in 1000), death (1 in 1000), kidney failure [usually temporary] (1 in 500), bleeding (1 in 200), allergic reaction [possibly serious] (1 in 200)], benefits (diagnostic support and management of coronary artery disease) and alternatives of a cardiac catheterization were discussed in detail with Mr. Kohring and he is willing to proceed.    2.  Primary hypertension: Blood pressure stable at 110/60 today.  He remains on beta-blocker, ARB, nitrate, diuretic therapy.  3.  Chronic HFpEF: Progressive dyspnea on exertion as outlined above.  Weight has been stable and only trace ankle edema on examination.  Heart rate and blood pressure stable.  Continue current regimen.  4.  Hyperlipidemia: Remains on lovastatin  with LDL of 80 in March of this year.  LDL has historically trended below 70 for many years.  Previously recommended dietary  adjustments-low-fat/low-cholesterol/increased fruits and vegetables.  We may need to add Zetia .  5.  Tachybradycardia syndrome/symptomatic bradycardia: Status post permanent pacemaker.  Atrial paced today.  6.  Paroxysmal atrial fibrillation: Maintaining sinus rhythm (atrial paced) on beta-blocker.  He is on Eliquis .  Plan to hold Eliquis  for 3 days prior to his pending catheterization.  7.  Absence episodes: Followed by neurology.  No recurrent episodes since being placed on Vimpat.  8.  Abdominal aortic aneurysm: Stable on imaging in November 2024.  Followed by vascular surgery.  Blood pressure stable.  9.  Stage III chronic kidney disease: Creatinine was stable at 1.39 in March.  Follow-up labs today in preparation for diagnostic catheterization.  Plan for hydration precath.    10.  Disposition: Follow-up CBC and basic metabolic panel today.  Plan for diagnostic catheterization next week with Dr. Alvenia Aus.  Follow-up in clinic approximate 2 weeks post catheterization.  Laneta Pintos, NP 07/20/2023, 8:37 AM

## 2023-07-20 NOTE — H&P (View-Only) (Signed)
 Cardiology Clinic Note   Patient Name: Nathaniel Thompson Date of Encounter: 07/20/2023  Primary Care Provider:  Dellar Fenton, MD Primary Cardiologist:  Antionette Kirks, MD  Patient Profile    88 y.o. 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  Past Medical History    Past Medical History:  Diagnosis Date   (HFpEF) heart failure with preserved ejection fraction (HCC)    a. 05/2018 Echo: Nl EF, Gr1 DD, sev dil LA, mild MR/TR, mild PAH; b. 05/2019 Echo: EF 55-60%, no rwma, mild LVH. Nl PASP. Mildly dil LA. Triv MR. Mild AoV sclerosis w/o stenosis. Mildly dil Asc AO (39mm); d. 02/2023 Echo [facility]): EF >55%, nl RV fxn, mild PR.   AAA (abdominal aortic aneurysm) (HCC)    Allergic rhinitis    Barrett's esophagus 10/21/2013   Bone cancer (HCC)    CKD (chronic kidney disease), stage III (HCC)    Colon polyp, hyperplastic    COPD (chronic obstructive pulmonary disease) (HCC)    mild COPD. former smoker   Coronary artery disease    a. 2003 s/p PCI ([facility]); b. 2004 s/p PCI x 2 ([facility]); c. 11/2017 Cath: LM nl, LAD mild dzs, LCX patent stent w/ 30% distal edge restenosis, RCA patent distal stent w/ 30% prox edge stenosis. Nl EF->Med Rx; d. 02/2021 Cath: LM nl, LAD min irregs, D1/2 min irregs, LCX 10p ISR, 37m, RCA 97m/d ISR, EF 65%; e. 07/2023 PET Stress: antlat and inflat ischemia.   Diverticulosis    Fundic gland polyps of stomach, benign    Gastritis    GERD (gastroesophageal reflux disease)    Hypercholesteremia    Hypertension    PAF (paroxysmal atrial fibrillation)  (HCC)    a. CHA2DS2VASc = 7-->eliquis .   Prostate cancer (HCC)    Prostatism    Pulmonary embolism (HCC)    a. 2023 - lobar and segmental PE in setting of RSV.   Reflux esophagitis    Renal stones    Skin cancer    left cheek/lesion excised   Sleep apnea    Tachy-brady syndrome (HCC)    a. 03/2021 s/p MDT MRI compatible DC PPM (ser # [id number]).   Tachycardia    a. 11/2017 admit w/ tachycardia/? afib-->no afib noted upon review (amio/OAC d/c'd).   TIA (transient ischemic attack)    a. 02/2023 admit [facility] - no stroke on MRI; b. 06/2022 EEG: nl for age.   Past Surgical History:  Procedure Laterality Date   CARDIAC CATHETERIZATION     COLONOSCOPY WITH PROPOFOL  N/A 09/13/2015   Procedure: COLONOSCOPY WITH PROPOFOL ;  Surgeon: Deveron Fly, MD;  Location: [facility] ENDOSCOPY;  Service: Endoscopy;  Laterality: N/A;   COLONOSCOPY WITH PROPOFOL  N/A 05/07/2019   Procedure: COLONOSCOPY WITH PROPOFOL ;  Surgeon: Marshall Skeeter, MD;  Location: [facility] ENDOSCOPY;  Service: Endoscopy;  Laterality: N/A;   CORONARY ANGIOPLASTY     ESOPHAGOGASTRODUODENOSCOPY (EGD) WITH PROPOFOL  N/A 09/13/2015   Procedure: ESOPHAGOGASTRODUODENOSCOPY (EGD) WITH PROPOFOL ;  Surgeon: Deveron Fly, MD;  Location: [facility] ENDOSCOPY;  Service: Endoscopy;  Laterality: N/A;   ESOPHAGOGASTRODUODENOSCOPY (EGD) WITH PROPOFOL  N/A 12/28/2015   Procedure: ESOPHAGOGASTRODUODENOSCOPY (EGD) WITH PROPOFOL ;  Surgeon: Deveron Fly, MD;  Location: [facility] ENDOSCOPY;  Service: Endoscopy;  Laterality: N/A;   ESOPHAGOGASTRODUODENOSCOPY (EGD) WITH PROPOFOL  N/A 06/16/2016   Procedure: ESOPHAGOGASTRODUODENOSCOPY (EGD) WITH PROPOFOL ;  Surgeon: Deveron Fly, MD;  Location: [facility] ENDOSCOPY;  Service: Endoscopy;  Laterality: N/A;   ESOPHAGOGASTRODUODENOSCOPY (EGD) WITH PROPOFOL  N/A 05/07/2019   Procedure: ESOPHAGOGASTRODUODENOSCOPY (EGD) WITH PROPOFOL ;  Surgeon: Marshall Skeeter, MD;  Location: [facility] ENDOSCOPY;  Service: Endoscopy;  Laterality: N/A;    EYE SURGERY  2013   CATARACT EXTRACTION   GANGLION CYST EXCISION Right 05/18/2017   Procedure: REMOVAL GANGLION CYST ANKLE;  Surgeon: Anell Baptist, DPM;  Location: [facility] ORS;  Service: Podiatry;  Laterality: Right;   heart cath stent     LEFT HEART CATH AND CORONARY ANGIOGRAPHY Right 12/03/2017   Procedure: Left Heart Cath and Coronary Angiography with possible coronary intervention;  Surgeon: Cherrie Cornwall, MD;  Location: [facility] INVASIVE CV LAB;  Service: Cardiovascular;  Laterality: Right;   PACEMAKER IMPLANT N/A 04/04/2021   Procedure: PACEMAKER IMPLANT;  Surgeon: Verona Goodwill, MD;  Location: [facility] INVASIVE CV LAB;  Service: Cardiovascular;  Laterality: N/A;   PROSTATE BIOPSY     RIGHT/LEFT HEART CATH AND CORONARY ANGIOGRAPHY N/A 02/07/2021   Procedure: RIGHT/LEFT HEART CATH AND CORONARY ANGIOGRAPHY;  Surgeon: Wenona Hamilton, MD;  Location: [facility] INVASIVE CV LAB;  Service: Cardiovascular;  Laterality: N/A;   stents     multiple    Allergies  Allergies  Allergen Reactions   Atorvastatin Other (See Comments)    Achy joints    History of Present Illness      88 y.o. 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    He previously underwent stenting of the left circumflex and RCA with initial PCI performed at [facility] in 2003 followed by repeat PCI x2 at [facility] in 2004.  Catheterization in 2019 showed a patent circumflex stent with 30% distal edge restenosis as well as a patent distal RCA stent with 30% proximal stenosis.  EF was normal.  In September 2019, he was also hospitalized with tachycardia with question of atrial  fibrillation.  He was initially placed on amiodarone  and anticoagulation but all of his EKGs revealed sinus rhythm at the time and thus amiodarone  and Eliquis  were discontinued.  He had worsening chest pain and dyspnea in December 2002 prompted diagnostic catheterization which showed patent stents in the left circumflex and RCA, with no other obstructive disease.  Right heart catheter mildly elevated filling pressures with mild pulmonary hypertension and normal cardiac output.  During catheterization, he was noted to have intermittent bradycardia with drops in heart rate to the 40s.  He subsequently underwent outpatient monitoring which showed frequent episodes of SVT.  He was seen by electrophysiology in January 2023 in the setting of tachybradycardia syndrome, he underwent permanent pacemaker placement.  In late 2023, he fell from a deer stand and fractured his left posterior calcaneus.  He was subsequently hospitalized at [facility] with dyspnea, cough, and fever in the setting of displaced fracture of the left posterior calcaneus.  He was found to have RSV as well as lobar and segmental pulmonary emboli.  He was treated with anticoagulation and during hospitalization was also noted to have  episodes of tachycardia and atrial fibrillation with rapid ventricular response necessitating initiation of metoprolol  therapy.  In December 2024, he was admitted to [facility] with slurred speech, right arm weakness, and suspected stroke.  tPA was not given secondary to background Eliquis  therapy.  He was hypertensive on presentation.  MRI of the brain showed no acute stroke.  Echo showed normal LV function without significant valvular disease.  Ultimately it was felt that this presentation represented a TIA.  In February 2025, he reported intermittent episodes of dizziness, vertigo, and reduced responsiveness lasting up to 10 to 15 minutes.  Pacemaker interrogation was unremarkable.  Losartan  dose was reduced.  EEG at [facility] was  normal for age.     Patient was seen in April 2025 with complaints of progressive dyspnea on exertion and reduced exercise tolerance.  He was not having chest pain.  Lexiscan  PET stress was performed and showed a medium defect with mid to basal anterolateral and inferolateral ischemia.  Since his last visit, Mr. Dewit has continued to have reduced activity tolerance, mild chest pressure, and dyspnea on exertion.  He thinks it has progressed some.  He denies palpitations, PND, orthopnea, dizziness, syncope, edema, or early satiety.  He is interested in pursuing diagnostic catheterization. Objective   Home Medications    Current Outpatient Medications  Medication Sig Dispense Refill   apixaban  (ELIQUIS ) 5 MG TABS tablet Take 1 tablet (5 mg total) by mouth 2 (two) times daily. 180 tablet 3   aspirin  EC 81 MG tablet Take 1 tablet (81 mg total) by mouth daily. Swallow whole. Begin once you stop Eliquis  prior to your heart cath.     Calcium  Carb-Cholecalciferol  (CALCIUM  + D3 PO) Take 1 tablet by mouth daily.     cholecalciferol  (VITAMIN D ) 1000 units tablet Take 1,000 Units by mouth daily.      ferrous sulfate 324 MG TBEC Take 324 mg by mouth daily.     furosemide  (LASIX ) 40 MG tablet TAKE 1 TABLET BY MOUTH EVERY OTHER DAY AS DIRECTED 45 tablet 3   isosorbide  mononitrate (IMDUR ) 30 MG 24 hr tablet Take 1 tablet (30 mg total) by mouth daily. 90 tablet 2   Lacosamide 100 MG TABS Take by mouth.     Leuprolide  Acetate, 3 Month, (ELIGARD ) 22.5 MG injection Inject 22.5 mg into the skin every 4 (four) months.     losartan  (COZAAR ) 25 MG tablet Take 1 tablet (25 mg total) by mouth daily. (Patient taking differently: Take 25 mg by mouth daily. Takes 1/2 tablet every morning) 90 tablet 3   lovastatin  (MEVACOR ) 40 MG tablet Take 2 tablets (80 mg total) by mouth at bedtime. 180 tablet 1   metoprolol  succinate (TOPROL -XL) 25 MG 24 hr tablet TAKE 1 TABLET BY MOUTH ONCE DAILY 90 tablet 3   Multiple Vitamin  (MULTI-VITAMINS) TABS Take 1 tablet by mouth daily.      pantoprazole  (PROTONIX ) 40 MG tablet Take 40 mg by mouth 2 (two) times daily.      potassium chloride  (KLOR-CON ) 10 MEQ tablet TAKE 1 TABLET BY MOUTH EVERY OTHER DAY WITH FUROSEMID 45 tablet 3   No current facility-administered medications for this visit.     Family History    Family History  Problem Relation Age of Onset   Cancer Mother        gastric and lung   Cancer Father        multiple myeloma   Stroke Father    Cancer Sister  leukemia   Cancer Brother        leukemia   Cancer Brother        kidney   Cancer Daughter        Uterine   Cancer Other        Nephes (Sister's Son): Prostate   He indicated that his mother is deceased. He indicated that his father is deceased. He indicated that both of his sisters are alive. He indicated that both of his brothers are deceased. He indicated that the status of his daughter is unknown. He indicated that the status of his other is unknown.   Social History    Social History   Socioeconomic History   Marital status: Married    Spouse name: Glenda   Number of children: 2   Years of education: Not on file   Highest education level: Not on file  Occupational History   Occupation: retired Production designer, theatre/television/film for [employer]  Tobacco Use   Smoking status: Former    Current packs/day: 0.00    Average packs/day: 1 pack/day for 35.0 years (35.0 ttl pk-yrs)    Types: Cigarettes    Start date: 03/07/1951    Quit date: 03/06/1986    Years since quitting: 37.3   Smokeless tobacco: Former    Types: Chew    Quit date: 1985   Tobacco comments:    Started smoking around 88 yrs old.    Smoked 1 PPD at his heaviest.  Vaping Use   Vaping status: Never Used  Substance and Sexual Activity   Alcohol use: No   Drug use: No   Sexual activity: Not Currently  Other Topics Concern   Not on file  Social History Narrative   Married, [place] Eland 2 step children and 2 children from previous  marriage   Social Drivers of Corporate investment banker Strain: Low Risk  (10/19/2022)   Overall Financial Resource Strain (CARDIA)    Difficulty of Paying Living Expenses: Not hard at all  Food Insecurity: No Food Insecurity (10/19/2022)   Hunger Vital Sign    Worried About Running Out of Food in the Last Year: Never true    Ran Out of Food in the Last Year: Never true  Transportation Needs: No Transportation Needs (10/19/2022)   PRAPARE - Administrator, Civil Service (Medical): No    Lack of Transportation (Non-Medical): No  Physical Activity: Sufficiently Active (10/19/2022)   Exercise Vital Sign    Days of Exercise per Week: 5 days    Minutes of Exercise per Session: 30 min  Stress: No Stress Concern Present (10/19/2022)   [employer] of Occupational Health - Occupational Stress Questionnaire    Feeling of Stress : Not at all  Social Connections: Moderately Integrated (10/19/2022)   Social Connection and Isolation Panel [NHANES]    Frequency of Communication with Friends and Family: More than three times a week    Frequency of Social Gatherings with Friends and Family: Three times a week    Attends Religious Services: More than 4 times per year    Active Member of Clubs or Organizations: No    Attends Banker Meetings: Never    Marital Status: Married  Catering manager Violence: Not At Risk (10/19/2022)   Humiliation, Afraid, Rape, and Kick questionnaire    Fear of Current or Ex-Partner: No    Emotionally Abused: No    Physically Abused: No    Sexually Abused: No     Review  of Systems    General:  No chills, fever, night sweats or weight changes.  Cardiovascular:  +++ Mild exertional chest pressure, +++ dyspnea on exertion, no edema, orthopnea, palpitations, paroxysmal nocturnal dyspnea. Dermatological: No rash, lesions/masses Respiratory: No cough, dyspnea Urologic: No hematuria, dysuria Abdominal:   No nausea, vomiting, diarrhea, bright red  blood per rectum, melena, or hematemesis Neurologic:  No visual changes, wkns, changes in mental status. All other systems reviewed and are otherwise negative except as noted above.     Physical Exam    VS:  BP 110/60 (BP Location: Left Arm, Patient Position: Sitting, Cuff Size: Normal)   Pulse 63   Ht 5\' 10"  (1.778 m)   Wt 220 lb (99.8 kg)   SpO2 95%   BMI 31.57 kg/m  , BMI Body mass index is 31.57 kg/m.     GEN: Well nourished, well developed, in no acute distress. HEENT: normal. Neck: Supple, no JVD, carotid bruits, or masses. Cardiac: RRR, no murmurs, rubs, or gallops. No clubbing, cyanosis, trace bilateral ankle edema.  Radials 2+/PT 2+ and equal bilaterally.  Respiratory:  Respirations regular and unlabored, clear to auscultation bilaterally. GI: Soft, nontender, nondistended, BS + x 4. MS: no deformity or atrophy. Skin: warm and dry, no rash. Neuro:  Strength and sensation are intact. Psych: Normal affect.  Accessory Clinical Findings    ECG personally reviewed by me today- EKG Interpretation Date/Time:  Friday Jul 20 2023 08:01:08 EDT Ventricular Rate:  63 PR Interval:  200 QRS Duration:  96 QT Interval:  390 QTC Calculation: 399 R Axis:   -24  Text Interpretation: Atrial-paced rhythm with occasional Premature ventricular complexes Nonspecific T wave abnormality Confirmed by Laneta Pintos [phone]) on 07/20/2023 8:18:30 AM   - No acute changes  Lab Results  Component Value Date   WBC 4.8 05/15/2023   HGB 12.2 (L) 05/15/2023   HCT 37.1 (L) 05/15/2023   MCV 90.7 05/15/2023   PLT 177 05/15/2023   Lab Results  Component Value Date   CREATININE 1.39 05/16/2023   BUN 23 05/16/2023   NA 140 05/16/2023   K 4.2 05/16/2023   CL 103 05/16/2023   CO2 27 05/16/2023   Lab Results  Component Value Date   ALT 13 05/16/2023   AST 18 05/16/2023   ALKPHOS 49 05/16/2023   BILITOT 0.6 05/16/2023   Lab Results  Component Value Date   CHOL 143 05/16/2023   HDL  38.50 (L) 05/16/2023   LDLCALC 80 05/16/2023   TRIG 120.0 05/16/2023   CHOLHDL 4 05/16/2023    Lab Results  Component Value Date   HGBA1C 5.7 05/16/2023      Assessment & Plan   1.  Unstable angina/coronary artery disease/abnormal stress test: Patient with prior history of circumflex and RCA stenting both of which were patent on catheterization in 2022.  Over the past 7 months, he has had progressive decline in activity tolerance, dyspnea on exertion, and mild chest pressure.  We obtained a Lexiscan  PET/CT following his last visit which showed a medium size defect with mid to basal anterolateral and inferolateral ischemia.  Results of testing discussed today.  In the setting of abnormal stress test with unstable angina, we will pursue diagnostic catheterization with Dr. Alvenia Aus next week.  CBC and basic metabolic panel today.  Patient will hold Eliquis  for 3 days prior to catheterization and start taking aspirin  81 mg daily upon holding Eliquis .  Continue beta-blocker, ARB, and nitrate. Informed Consent   Shared Decision  Making/Informed Consent The risks [stroke (1 in 1000), death (1 in 1000), kidney failure [usually temporary] (1 in 500), bleeding (1 in 200), allergic reaction [possibly serious] (1 in 200)], benefits (diagnostic support and management of coronary artery disease) and alternatives of a cardiac catheterization were discussed in detail with Mr. Kohring and he is willing to proceed.    2.  Primary hypertension: Blood pressure stable at 110/60 today.  He remains on beta-blocker, ARB, nitrate, diuretic therapy.  3.  Chronic HFpEF: Progressive dyspnea on exertion as outlined above.  Weight has been stable and only trace ankle edema on examination.  Heart rate and blood pressure stable.  Continue current regimen.  4.  Hyperlipidemia: Remains on lovastatin  with LDL of 80 in March of this year.  LDL has historically trended below 70 for many years.  Previously recommended dietary  adjustments-low-fat/low-cholesterol/increased fruits and vegetables.  We may need to add Zetia .  5.  Tachybradycardia syndrome/symptomatic bradycardia: Status post permanent pacemaker.  Atrial paced today.  6.  Paroxysmal atrial fibrillation: Maintaining sinus rhythm (atrial paced) on beta-blocker.  He is on Eliquis .  Plan to hold Eliquis  for 3 days prior to his pending catheterization.  7.  Absence episodes: Followed by neurology.  No recurrent episodes since being placed on Vimpat.  8.  Abdominal aortic aneurysm: Stable on imaging in November 2024.  Followed by vascular surgery.  Blood pressure stable.  9.  Stage III chronic kidney disease: Creatinine was stable at 1.39 in March.  Follow-up labs today in preparation for diagnostic catheterization.  Plan for hydration precath.    10.  Disposition: Follow-up CBC and basic metabolic panel today.  Plan for diagnostic catheterization next week with Dr. Alvenia Aus.  Follow-up in clinic approximate 2 weeks post catheterization.  Laneta Pintos, NP 07/20/2023, 8:37 AM

## 2023-07-21 LAB — CBC
Hematocrit: 37.9 % (ref 37.5–51.0)
Hemoglobin: 12.5 g/dL — ABNORMAL LOW (ref 13.0–17.7)
MCH: 30.1 pg (ref 26.6–33.0)
MCHC: 33 g/dL (ref 31.5–35.7)
MCV: 91 fL (ref 79–97)
Platelets: 185 10*3/uL (ref 150–450)
RBC: 4.15 x10E6/uL (ref 4.14–5.80)
RDW: 13.1 % (ref 11.6–15.4)
WBC: 5.3 10*3/uL (ref 3.4–10.8)

## 2023-07-21 LAB — BASIC METABOLIC PANEL WITH GFR
BUN/Creatinine Ratio: 13 (ref 10–24)
BUN: 18 mg/dL (ref 8–27)
CO2: 22 mmol/L (ref 20–29)
Calcium: 9.9 mg/dL (ref 8.6–10.2)
Chloride: 103 mmol/L (ref 96–106)
Creatinine, Ser: 1.41 mg/dL — ABNORMAL HIGH (ref 0.76–1.27)
Glucose: 86 mg/dL (ref 70–99)
Potassium: 4.6 mmol/L (ref 3.5–5.2)
Sodium: 140 mmol/L (ref 134–144)
eGFR: 49 mL/min/{1.73_m2} — ABNORMAL LOW (ref >59)

## 2023-07-23 ENCOUNTER — Ambulatory Visit: Payer: Self-pay | Admitting: Nurse Practitioner

## 2023-07-24 ENCOUNTER — Encounter (INDEPENDENT_AMBULATORY_CARE_PROVIDER_SITE_OTHER): Payer: Self-pay

## 2023-07-27 ENCOUNTER — Encounter
Admission: RE | Disposition: A | Discharge status: Home / Self Care | Payer: Self-pay | Source: Home / Self Care | Attending: Cardiovascular Disease

## 2023-07-27 ENCOUNTER — Ambulatory Visit
Admission: RE | Admit: 2023-07-27 | Discharge: 2023-07-27 | Disposition: A | Discharge status: Home / Self Care | Attending: Cardiovascular Disease | Admitting: Cardiovascular Disease

## 2023-07-27 ENCOUNTER — Other Ambulatory Visit: Payer: Self-pay | Admitting: *Deleted

## 2023-07-27 ENCOUNTER — Encounter: Payer: Self-pay | Admitting: Cardiovascular Disease

## 2023-07-27 ENCOUNTER — Other Ambulatory Visit: Payer: Self-pay

## 2023-07-27 DIAGNOSIS — Z87891 Personal history of nicotine dependence: Secondary | ICD-10-CM | POA: Diagnosis not present

## 2023-07-27 DIAGNOSIS — Z955 Presence of coronary angioplasty implant and graft: Secondary | ICD-10-CM | POA: Insufficient documentation

## 2023-07-27 DIAGNOSIS — Z950 Presence of cardiac pacemaker: Secondary | ICD-10-CM | POA: Insufficient documentation

## 2023-07-27 DIAGNOSIS — I130 Hypertensive heart and chronic kidney disease with heart failure and stage 1 through stage 4 chronic kidney disease, or unspecified chronic kidney disease: Secondary | ICD-10-CM | POA: Insufficient documentation

## 2023-07-27 DIAGNOSIS — I25119 Atherosclerotic heart disease of native coronary artery with unspecified angina pectoris: Secondary | ICD-10-CM | POA: Diagnosis not present

## 2023-07-27 DIAGNOSIS — I495 Sick sinus syndrome: Secondary | ICD-10-CM | POA: Insufficient documentation

## 2023-07-27 DIAGNOSIS — E785 Hyperlipidemia, unspecified: Secondary | ICD-10-CM | POA: Diagnosis not present

## 2023-07-27 DIAGNOSIS — Z7901 Long term (current) use of anticoagulants: Secondary | ICD-10-CM | POA: Diagnosis not present

## 2023-07-27 DIAGNOSIS — I200 Unstable angina: Secondary | ICD-10-CM

## 2023-07-27 DIAGNOSIS — I480 Paroxysmal atrial fibrillation: Secondary | ICD-10-CM | POA: Insufficient documentation

## 2023-07-27 DIAGNOSIS — N183 Chronic kidney disease, stage 3 unspecified: Secondary | ICD-10-CM | POA: Insufficient documentation

## 2023-07-27 DIAGNOSIS — I5032 Chronic diastolic (congestive) heart failure: Secondary | ICD-10-CM | POA: Insufficient documentation

## 2023-07-27 DIAGNOSIS — Z79899 Other long term (current) drug therapy: Secondary | ICD-10-CM | POA: Diagnosis not present

## 2023-07-27 DIAGNOSIS — I209 Angina pectoris, unspecified: Secondary | ICD-10-CM | POA: Diagnosis present

## 2023-07-27 DIAGNOSIS — I713 Abdominal aortic aneurysm, ruptured, unspecified: Secondary | ICD-10-CM | POA: Insufficient documentation

## 2023-07-27 DIAGNOSIS — R9439 Abnormal result of other cardiovascular function study: Secondary | ICD-10-CM

## 2023-07-27 DIAGNOSIS — R0602 Shortness of breath: Secondary | ICD-10-CM

## 2023-07-27 HISTORY — PX: LEFT HEART CATH AND CORONARY ANGIOGRAPHY: CATH118249

## 2023-07-27 SURGERY — LEFT HEART CATH AND CORONARY ANGIOGRAPHY
Anesthesia: Moderate Sedation | Laterality: Left

## 2023-07-27 MED ORDER — SODIUM CHLORIDE 0.9 % WEIGHT BASED INFUSION: 1.0000 mL/kg/h | INTRAVENOUS | Status: DC

## 2023-07-27 MED ORDER — HEPARIN SODIUM (PORCINE) 1000 UNIT/ML IJ SOLN
INTRAMUSCULAR | Status: AC
  Filled 2023-07-27: qty 10

## 2023-07-27 MED ORDER — MIDAZOLAM HCL 2 MG/2ML IJ SOLN
INTRAMUSCULAR | Status: DC | PRN
  Administered 2023-07-27: 1 mg via INTRAVENOUS

## 2023-07-27 MED ORDER — HEPARIN (PORCINE) IN NACL 1000-0.9 UT/500ML-% IV SOLN
INTRAVENOUS | Status: AC
  Filled 2023-07-27: qty 1000

## 2023-07-27 MED ORDER — SODIUM CHLORIDE 0.9 % WEIGHT BASED INFUSION
3.0000 mL/kg/h | INTRAVENOUS | Status: AC
  Administered 2023-07-27: 3 mL/kg/h via INTRAVENOUS

## 2023-07-27 MED ORDER — HEPARIN (PORCINE) IN NACL 1000-0.9 UT/500ML-% IV SOLN
INTRAVENOUS | Status: DC | PRN
Start: 2023-07-27 — End: 2023-07-27
  Administered 2023-07-27: 1000 mL

## 2023-07-27 MED ORDER — HEPARIN SODIUM (PORCINE) 1000 UNIT/ML IJ SOLN
INTRAMUSCULAR | Status: DC | PRN
Start: 2023-07-27 — End: 2023-07-27
  Administered 2023-07-27: 5000 [IU] via INTRAVENOUS

## 2023-07-27 MED ORDER — ONDANSETRON HCL 4 MG/2ML IJ SOLN: 4.0000 mg | Freq: Four times a day (QID) | INTRAMUSCULAR | Status: DC | PRN

## 2023-07-27 MED ORDER — ASPIRIN 81 MG PO CHEW: 81.0000 mg | CHEWABLE_TABLET | ORAL | Status: DC

## 2023-07-27 MED ORDER — LIDOCAINE HCL 1 % IJ SOLN
INTRAMUSCULAR | Status: AC
  Filled 2023-07-27: qty 20

## 2023-07-27 MED ORDER — SODIUM CHLORIDE 0.9 % IV SOLN: INTRAVENOUS | Status: AC

## 2023-07-27 MED ORDER — SODIUM CHLORIDE 0.9% FLUSH: 3.0000 mL | INTRAVENOUS | Status: DC | PRN

## 2023-07-27 MED ORDER — VERAPAMIL HCL 2.5 MG/ML IV SOLN
INTRAVENOUS | Status: AC
  Filled 2023-07-27: qty 2

## 2023-07-27 MED ORDER — MIDAZOLAM HCL 2 MG/2ML IJ SOLN
INTRAMUSCULAR | Status: AC
  Filled 2023-07-27: qty 2

## 2023-07-27 MED ORDER — VERAPAMIL HCL 2.5 MG/ML IV SOLN
INTRAVENOUS | Status: DC | PRN
Start: 2023-07-27 — End: 2023-07-27
  Administered 2023-07-27: 2.5 mg via INTRA_ARTERIAL

## 2023-07-27 MED ORDER — IOHEXOL 300 MG/ML  SOLN
INTRAMUSCULAR | Status: DC | PRN
  Administered 2023-07-27: 56 mL

## 2023-07-27 MED ORDER — SODIUM CHLORIDE 0.9% FLUSH: 3.0000 mL | Freq: Two times a day (BID) | INTRAVENOUS | Status: DC

## 2023-07-27 MED ORDER — SODIUM CHLORIDE 0.9 % IV SOLN: 250.0000 mL | INTRAVENOUS | Status: DC | PRN

## 2023-07-27 MED ORDER — ACETAMINOPHEN 325 MG PO TABS: 650.0000 mg | ORAL_TABLET | ORAL | Status: DC | PRN

## 2023-07-27 MED ORDER — FENTANYL CITRATE (PF) 100 MCG/2ML IJ SOLN
INTRAMUSCULAR | Status: AC
  Filled 2023-07-27: qty 2

## 2023-07-27 MED ORDER — FENTANYL CITRATE (PF) 100 MCG/2ML IJ SOLN
INTRAMUSCULAR | Status: DC | PRN
  Administered 2023-07-27: 25 ug via INTRAVENOUS

## 2023-07-27 SURGICAL SUPPLY — 10 items
CATH INFINITI 5 FR JL3.5 (CATHETERS) IMPLANT
CATH INFINITI AMBI 5FR TG (CATHETERS) IMPLANT
DEVICE RAD TR BAND REGULAR (VASCULAR PRODUCTS) IMPLANT
DRAPE BRACHIAL (DRAPES) IMPLANT
GLIDESHEATH SLEND SS 6F .021 (SHEATH) IMPLANT
GUIDEWIRE INQWIRE 1.5J.035X260 (WIRE) IMPLANT
KIT SYRINGE INJ CVI SPIKEX1 (MISCELLANEOUS) IMPLANT
PACK CARDIAC CATH (CUSTOM PROCEDURE TRAY) ×2 IMPLANT
SET ATX-X65L (MISCELLANEOUS) IMPLANT
STATION PROTECTION PRESSURIZED (MISCELLANEOUS) IMPLANT

## 2023-07-27 NOTE — Discharge Instructions (Signed)
 Radial Site Care Refer to this sheet in the next few weeks. These instructions provide you with information about caring for yourself after your procedure. Your health care provider may also give you more specific instructions. Your treatment has been planned according to current medical practices, but problems sometimes occur. Call your health care provider if you have any problems or questions after your procedure. What can I expect after the procedure? After your procedure, it is typical to have the following: Bruising at the radial site that usually fades within 1-2 weeks. Blood collecting in the tissue (hematoma) that may be painful to the touch. It should usually decrease in size and tenderness within 1-2 weeks.  Follow these instructions at home: Take medicines only as directed by your health care provider. If you are on a medication called Metformin please do not take for 48 hours after your procedure. Over the next 48hrs please increase your fluid intake of water and non caffeine beverages to flush the contrast dye out of your system.  You may shower 24 hours after the procedure  Leave your bandage on and gently wash the site with plain soap and water. Pat the area dry with a clean towel. Do not rub the site, because this may cause bleeding.  Remove your dressing 48hrs after your procedure and leave open to air.  Do not submerge your site in water for 7 days. This includes swimming and washing dishes.  Check your insertion site every day for redness, swelling, or drainage. Do not apply powder or lotion to the site. Do not flex or bend the affected arm for 24 hours or as directed by your health care provider. Do not push or pull heavy objects with the affected arm for 24 hours or as directed by your health care provider. Do not lift over 10 lb (4.5 kg) for 5 days after your procedure or as directed by your health care provider. Ask your health care provider when it is okay to: Return to  work or school. Resume usual physical activities or sports. Resume sexual activity. Do not drive home if you are discharged the same day as the procedure. Have someone else drive you. You may drive 48 hours after the procedure Do not operate machinery or power tools for 24 hours after the procedure. If your procedure was done as an outpatient procedure, which means that you went home the same day as your procedure, a responsible adult should be with you for the first 24 hours after you arrive home. Keep all follow-up visits as directed by your health care provider. This is important. Contact a health care provider if: You have a fever. You have chills. You have increased bleeding from the radial site. Hold pressure on the site. Get help right away if: You have unusual pain at the radial site. You have redness, warmth, or swelling at the radial site. You have drainage (other than a small amount of blood on the dressing) from the radial site. The radial site is bleeding, and the bleeding does not stop after 15 minutes of holding steady pressure on the site. Your arm or hand becomes pale, cool, tingly, or numb. This information is not intended to replace advice given to you by your health care provider. Make sure you discuss any questions you have with your health care provider. Document Released: 03/25/2010 Document Revised: 07/29/2015 Document Reviewed: 09/08/2013 Elsevier Interactive Patient Education  2018 [employer].

## 2023-07-27 NOTE — Interval H&P Note (Signed)
 History and Physical Interval Note:  07/27/2023 8:50 AM  Nathaniel Thompson  has presented today for surgery, with the diagnosis of L Cath   Abnormal stress test.  The various methods of treatment have been discussed with the patient and family. After consideration of risks, benefits and other options for treatment, the patient has consented to  Procedure(s): LEFT HEART CATH AND CORONARY ANGIOGRAPHY (Left) as a surgical intervention.  The patient's history has been reviewed, patient examined, no change in status, stable for surgery.  I have reviewed the patient's chart and labs.  Questions were answered to the patient's satisfaction.     Yaacov Koziol

## 2023-08-02 ENCOUNTER — Other Ambulatory Visit

## 2023-08-08 ENCOUNTER — Other Ambulatory Visit: Payer: Self-pay | Admitting: Pharmacist

## 2023-08-08 DIAGNOSIS — I4891 Unspecified atrial fibrillation: Secondary | ICD-10-CM

## 2023-08-08 MED ORDER — APIXABAN 5 MG PO TABS: 5.0000 mg | ORAL_TABLET | Freq: Two times a day (BID) | ORAL | 1 refills | Status: DC

## 2023-08-08 NOTE — Telephone Encounter (Signed)
 Prescription refill request for Eliquis  received. Indication: a fib Last office visit: 07/20/23 Scr: 1.41 epic 07/20/23 Age: 88 Weight: 99kg

## 2023-08-10 ENCOUNTER — Inpatient Hospital Stay: Attending: Internal Medicine

## 2023-08-10 ENCOUNTER — Ambulatory Visit: Attending: Cardiovascular Disease

## 2023-08-10 DIAGNOSIS — R0602 Shortness of breath: Secondary | ICD-10-CM

## 2023-08-10 DIAGNOSIS — C61 Malignant neoplasm of prostate: Secondary | ICD-10-CM | POA: Insufficient documentation

## 2023-08-10 DIAGNOSIS — C7951 Secondary malignant neoplasm of bone: Secondary | ICD-10-CM | POA: Diagnosis not present

## 2023-08-10 DIAGNOSIS — N183 Chronic kidney disease, stage 3 unspecified: Secondary | ICD-10-CM | POA: Insufficient documentation

## 2023-08-10 DIAGNOSIS — D509 Iron deficiency anemia, unspecified: Secondary | ICD-10-CM | POA: Insufficient documentation

## 2023-08-10 LAB — CMP (CANCER CENTER ONLY)
ALT: 13 U/L (ref 0–44)
AST: 20 U/L (ref 15–41)
Albumin: 3.9 g/dL (ref 3.5–5.0)
Alkaline Phosphatase: 43 U/L (ref 38–126)
Anion gap: 6 (ref 5–15)
BUN: 21 mg/dL (ref 8–23)
CO2: 23 mmol/L (ref 22–32)
Calcium: 8.8 mg/dL — ABNORMAL LOW (ref 8.9–10.3)
Chloride: 109 mmol/L (ref 98–111)
Creatinine: 1.25 mg/dL — ABNORMAL HIGH (ref 0.61–1.24)
GFR, Estimated: 56 mL/min — ABNORMAL LOW (ref >60)
Glucose, Bld: 102 mg/dL — ABNORMAL HIGH (ref 70–99)
Potassium: 4 mmol/L (ref 3.5–5.1)
Sodium: 138 mmol/L (ref 135–145)
Total Bilirubin: 0.7 mg/dL (ref 0.0–1.2)
Total Protein: 6.4 g/dL — ABNORMAL LOW (ref 6.5–8.1)

## 2023-08-10 LAB — IRON AND TIBC
Iron: 118 ug/dL (ref 45–182)
Saturation Ratios: 31 % (ref 17.9–39.5)
TIBC: 377 ug/dL (ref 250–450)
UIBC: 259 ug/dL

## 2023-08-10 LAB — CBC WITH DIFFERENTIAL (CANCER CENTER ONLY)
Abs Immature Granulocytes: 0.01 10*3/uL (ref 0.00–0.07)
Basophils Absolute: 0 10*3/uL (ref 0.0–0.1)
Basophils Relative: 0 %
Eosinophils Absolute: 0.2 10*3/uL (ref 0.0–0.5)
Eosinophils Relative: 3 %
HCT: 34.2 % — ABNORMAL LOW (ref 39.0–52.0)
Hemoglobin: 11.3 g/dL — ABNORMAL LOW (ref 13.0–17.0)
Immature Granulocytes: 0 %
Lymphocytes Relative: 25 %
Lymphs Abs: 1.1 10*3/uL (ref 0.7–4.0)
MCH: 29.8 pg (ref 26.0–34.0)
MCHC: 33 g/dL (ref 30.0–36.0)
MCV: 90.2 fL (ref 80.0–100.0)
Monocytes Absolute: 0.5 10*3/uL (ref 0.1–1.0)
Monocytes Relative: 11 %
Neutro Abs: 2.8 10*3/uL (ref 1.7–7.7)
Neutrophils Relative %: 61 %
Platelet Count: 169 10*3/uL (ref 150–400)
RBC: 3.79 MIL/uL — ABNORMAL LOW (ref 4.22–5.81)
RDW: 13.5 % (ref 11.5–15.5)
WBC Count: 4.6 10*3/uL (ref 4.0–10.5)
nRBC: 0 % (ref 0.0–0.2)

## 2023-08-10 LAB — FERRITIN: Ferritin: 14 ng/mL — ABNORMAL LOW (ref 24–336)

## 2023-08-10 LAB — PSA: Prostatic Specific Antigen: 0.01 ng/mL (ref 0.00–4.00)

## 2023-08-11 LAB — ECHOCARDIOGRAM COMPLETE
AR max vel: 3.31 cm2
AV Area VTI: 3.3 cm2
AV Area mean vel: 3.23 cm2
AV Mean grad: 5 mmHg
AV Peak grad: 9.8 mmHg
Ao pk vel: 1.57 m/s
Area-P 1/2: 3.48 cm2
Calc EF: 55.9 %
S' Lateral: 3.4 cm
Single Plane A2C EF: 58.7 %
Single Plane A4C EF: 58.3 %

## 2023-08-13 ENCOUNTER — Other Ambulatory Visit: Payer: Self-pay

## 2023-08-13 DIAGNOSIS — I4891 Unspecified atrial fibrillation: Secondary | ICD-10-CM

## 2023-08-13 NOTE — Telephone Encounter (Signed)
 [facility] pharmacy requesting a refill on on Eliquis  5 mg one tablet twice a day for a 90 day supply. Please review refill.

## 2023-08-14 ENCOUNTER — Encounter: Payer: Self-pay | Admitting: Internal Medicine

## 2023-08-14 ENCOUNTER — Other Ambulatory Visit

## 2023-08-14 ENCOUNTER — Inpatient Hospital Stay: Admitting: Internal Medicine

## 2023-08-14 ENCOUNTER — Ambulatory Visit: Attending: Cardiovascular Disease | Admitting: Cardiovascular Disease

## 2023-08-14 ENCOUNTER — Inpatient Hospital Stay

## 2023-08-14 ENCOUNTER — Encounter: Payer: Self-pay | Admitting: Cardiovascular Disease

## 2023-08-14 VITALS — BP 130/78 | HR 80 | Ht 70.0 in | Wt 222.4 lb

## 2023-08-14 VITALS — BP 132/77 | HR 76

## 2023-08-14 VITALS — BP 100/51 | HR 87 | Temp 98.6°F | Resp 15 | Wt 221.0 lb

## 2023-08-14 DIAGNOSIS — I4891 Unspecified atrial fibrillation: Secondary | ICD-10-CM

## 2023-08-14 DIAGNOSIS — D509 Iron deficiency anemia, unspecified: Secondary | ICD-10-CM | POA: Diagnosis not present

## 2023-08-14 DIAGNOSIS — I10 Essential (primary) hypertension: Secondary | ICD-10-CM | POA: Diagnosis not present

## 2023-08-14 DIAGNOSIS — C61 Malignant neoplasm of prostate: Secondary | ICD-10-CM

## 2023-08-14 DIAGNOSIS — I25119 Atherosclerotic heart disease of native coronary artery with unspecified angina pectoris: Secondary | ICD-10-CM | POA: Diagnosis not present

## 2023-08-14 DIAGNOSIS — I714 Abdominal aortic aneurysm, without rupture, unspecified: Secondary | ICD-10-CM

## 2023-08-14 DIAGNOSIS — I5032 Chronic diastolic (congestive) heart failure: Secondary | ICD-10-CM

## 2023-08-14 DIAGNOSIS — E7800 Pure hypercholesterolemia, unspecified: Secondary | ICD-10-CM

## 2023-08-14 DIAGNOSIS — I724 Aneurysm of artery of lower extremity: Secondary | ICD-10-CM

## 2023-08-14 DIAGNOSIS — I6523 Occlusion and stenosis of bilateral carotid arteries: Secondary | ICD-10-CM | POA: Diagnosis not present

## 2023-08-14 MED ORDER — SODIUM CHLORIDE 0.9% FLUSH
10.0000 mL | Freq: Once | INTRAVENOUS | Status: AC | PRN
  Administered 2023-08-14: 10 mL
  Filled 2023-08-14: qty 10

## 2023-08-14 MED ORDER — APIXABAN 5 MG PO TABS
5.0000 mg | ORAL_TABLET | Freq: Two times a day (BID) | ORAL | 0 refills | Status: DC
Start: 2023-08-14 — End: 2024-01-23

## 2023-08-14 MED ORDER — APIXABAN 5 MG PO TABS
5.0000 mg | ORAL_TABLET | Freq: Two times a day (BID) | ORAL | 1 refills | Status: DC
Start: 2023-08-14 — End: 2023-08-14

## 2023-08-14 MED ORDER — IRON SUCROSE 20 MG/ML IV SOLN
200.0000 mg | Freq: Once | INTRAVENOUS | Status: AC
  Administered 2023-08-14: 200 mg via INTRAVENOUS

## 2023-08-14 NOTE — Progress Notes (Signed)
Patient here for follow up. No new concerns voiced at this time.

## 2023-08-14 NOTE — Assessment & Plan Note (Addendum)
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  Currently getting intermittent Eligard . MAY 2025-- PSA <0.01.  Hold Eligard  today.  If significantly elevated would recommend Eligard . If PSA rising would consider bone scan.  Stable.   # Iron  deficient anemia/CKD- III-[s/p EGD March 2021]--status post IV iron  infusion x1 [FEB 2022]-MARCH 2025--hemoglobin is 11-12; ferritin-14  Overall stable.  Continue gentle iron  [iron  biglycinate; 28 mg ] 1 pill a day.  Venofer  today-  # Nov 26th, 2023-acute PE -lobar and segmental PE [[facility]]/ A.fib - on Eliquis  [no asprin/plavix ]-provoked left foot surgery. However, pt on indefinite Eliquis  5 mg twice daily. Stable.   # TIA-Dec 2024/ [Hx of stroke]- MRI- [facility] [Dr.Hamlin]- negative for any new stroke; old strokes-awaiing EEG- APRIL 2025- his routine EEG, performed during awake and asleep state, is largely normal. There were no seizures, epileptiform activity, or push button events. ; again discussed re: bleeding precautions on eliquis .   Stable. On Vimpat [neurology-[facility]]   #CKD stage III-GFR 40-50s. Clinically Stable.   Eligard  22.5 q4 m- ? nov 2023.  # DISPOSITION:  #  add to med list lacosamide (VIMPAT) 100 mg Tab  Take 1 tablet (100 mg total) by mouth two (2) times a day.   # HOLD eligard ; #  Venofer  today # Venofer  in 1 month- # Follow up in 3 months-  MD; 2-3 days prior- labs- cbc.cmp/iron  studies; ferritin; PSA eligard ; possible venofer - Dr.B  Cc; Dr.Scott/

## 2023-08-14 NOTE — Progress Notes (Signed)
 [facility] OFFICE PROGRESS NOTE  Patient Care Team: Dellar Fenton, MD as PCP - General (Internal Medicine) Wenona Hamilton, MD as PCP - Cardiology (Cardiology) Wenona Hamilton, MD as Consulting Physician (Cardiology) Gwyn Leos, MD as Consulting Physician (Hematology and Oncology) Prescilla Brod, Ninette Basque, MD (Vascular Surgery) Francina Irish, NP as Nurse Practitioner (Gastroenterology) Marc Senior, MD as Consulting Physician (Pulmonary Disease)   Cancer Staging  No matching staging information was found for the patient.    Oncology History Overview Note  Nov 2017- completed IM RT radiation therapy to his prostate and pelvic nodes for Gleason 7 (4+3) adenocarcinoma the prostate presenting the PSA of 7.8.  # JAN 2019- METASTATIC PROSTATE CA to Bone [low volume met on bone scan; CT- NED]; PSA ~50; March 25th 2019- Lupron  45 mg IM [Urology q 62M]; June 26, 2018-Eligard  every 3 months[intolerance to Lupron /question A. Fib/injection site pain  # May 23rd Zytiga  250mg /day; no prednisone ; STOPPED MARCH 17th 2021-repeated severe hypokalemia; Eligard   # Barretts- EGD/ colo [March 2021; Dr.Byrnett].   # [place] [[facility] [place]]; Oct 2019-paroxysmal A.fib/not on eliquis ; [Dr.Khan] -Dr.Arida ; Dizzy spells [Dr.Shah] s/p PPM [2023]  ------------------------------------------------------------------  DIAGNOSIS: [ JAN 2019]- Met- PROSTATE CANCER  STAGE:  IV       ;GOALS: PALLIATIVE     Prostate cancer (HCC)    INTERVAL HISTORY: Patient is with his wife.  He is walking with a cane.   Nathaniel Thompson 88 y.o.  male pleasant patient above history of castrate sensitive metastatic prostate cancer on ADT; hx of provoked PE [[facility]]; and hx of paroxysmal A-fib-on Eliquis ; Hx of TIA Nathaniel Thompson is here for follow-up.  Patient here for follow up. No new concerns voiced at this time except intermittent worsening fatigue with exertion.   Chronic  right hip pain.  Knees hurt.  Bowels good. Denies pelvic pain or probs urinating. No blood in urine  Continues have intermittent hemorrhoid that bleeds on the toilet tissue bright red blood.  No pain in pelvis. Voiding well. Appetite is fair. Occ hot flashes remain.   Review of Systems  Constitutional:  Negative for chills, diaphoresis, fever, malaise/fatigue and weight loss.  HENT:  Negative for nosebleeds and sore throat.   Eyes:  Negative for double vision.  Respiratory:  Negative for cough, hemoptysis, sputum production, shortness of breath and wheezing.   Cardiovascular:  Negative for chest pain, palpitations, orthopnea and leg swelling.  Gastrointestinal:  Negative for abdominal pain, blood in stool, constipation, diarrhea, heartburn, melena, nausea and vomiting.  Genitourinary:  Negative for dysuria, frequency and urgency.  Musculoskeletal:  Positive for myalgias. Negative for back pain and joint pain.  Skin: Negative.  Negative for itching and rash.  Neurological:  Negative for tingling, focal weakness, weakness and headaches.  Endo/Heme/Allergies:  Does not bruise/bleed easily.  Psychiatric/Behavioral:  Negative for depression. The patient is not nervous/anxious and does not have insomnia.     PAST MEDICAL HISTORY :  Past Medical History:  Diagnosis Date   (HFpEF) heart failure with preserved ejection fraction (HCC)    a. 05/2018 Echo: Nl EF, Gr1 DD, sev dil LA, mild MR/TR, mild PAH; b. 05/2019 Echo: EF 55-60%, no rwma, mild LVH. Nl PASP. Mildly dil LA. Triv MR. Mild AoV sclerosis w/o stenosis. Mildly dil Asc AO (39mm); d. 02/2023 Echo [facility]): EF >55%, nl RV fxn, mild PR.   AAA (abdominal aortic aneurysm) (HCC)    Allergic rhinitis    Barrett's esophagus 10/21/2013   Bone  cancer (HCC)    CKD (chronic kidney disease), stage III (HCC)    Colon polyp, hyperplastic    COPD (chronic obstructive pulmonary disease) (HCC)    mild COPD. former smoker   Coronary artery disease    a. 2003 s/p PCI ([facility]);  b. 2004 s/p PCI x 2 ([facility]); c. 11/2017 Cath: LM nl, LAD mild dzs, LCX patent stent w/ 30% distal edge restenosis, RCA patent distal stent w/ 30% prox edge stenosis. Nl EF->Med Rx; d. 02/2021 Cath: LM nl, LAD min irregs, D1/2 min irregs, LCX 10p ISR, 2m, RCA 84m/d ISR, EF 65%; e. 07/2023 PET Stress: antlat and inflat ischemia.   Diverticulosis    Fundic gland polyps of stomach, benign    Gastritis    GERD (gastroesophageal reflux disease)    Hypercholesteremia    Hypertension    PAF (paroxysmal atrial fibrillation) (HCC)    a. CHA2DS2VASc = 7-->eliquis .   Prostate cancer (HCC)    Prostatism    Pulmonary embolism (HCC)    a. 2023 - lobar and segmental PE in setting of RSV.   Reflux esophagitis    Renal stones    Skin cancer    left cheek/lesion excised   Sleep apnea    Tachy-brady syndrome (HCC)    a. 03/2021 s/p MDT MRI compatible DC PPM (ser # [id number]).   Tachycardia    a. 11/2017 admit w/ tachycardia/? afib-->no afib noted upon review (amio/OAC d/c'd).   TIA (transient ischemic attack)    a. 02/2023 admit [facility] - no stroke on MRI; b. 06/2022 EEG: nl for age.    PAST SURGICAL HISTORY :   Past Surgical History:  Procedure Laterality Date   CARDIAC CATHETERIZATION     COLONOSCOPY WITH PROPOFOL  N/A 09/13/2015   Procedure: COLONOSCOPY WITH PROPOFOL ;  Surgeon: Deveron Fly, MD;  Location: [facility] ENDOSCOPY;  Service: Endoscopy;  Laterality: N/A;   COLONOSCOPY WITH PROPOFOL  N/A 05/07/2019   Procedure: COLONOSCOPY WITH PROPOFOL ;  Surgeon: Marshall Skeeter, MD;  Location: [facility] ENDOSCOPY;  Service: Endoscopy;  Laterality: N/A;   CORONARY ANGIOPLASTY     ESOPHAGOGASTRODUODENOSCOPY (EGD) WITH PROPOFOL  N/A 09/13/2015   Procedure: ESOPHAGOGASTRODUODENOSCOPY (EGD) WITH PROPOFOL ;  Surgeon: Deveron Fly, MD;  Location[facility] ENDOSCOPY;  Service: Endoscopy;  Laterality: N/A;   ESOPHAGOGASTRODUODENOSCOPY (EGD) WITH PROPOFOL  N/A 12/28/2015   Procedure: ESOPHAGOGASTRODUODENOSCOPY (EGD) WITH  PROPOFOL ;  Surgeon: Deveron Fly, MD;  Location: [facility] ENDOSCOPY;  Service: Endoscopy;  Laterality: N/A;   ESOPHAGOGASTRODUODENOSCOPY (EGD) WITH PROPOFOL  N/A 06/16/2016   Procedure: ESOPHAGOGASTRODUODENOSCOPY (EGD) WITH PROPOFOL ;  Surgeon: Deveron Fly, MD;  Location: [facility] ENDOSCOPY;  Service: Endoscopy;  Laterality: N/A;   ESOPHAGOGASTRODUODENOSCOPY (EGD) WITH PROPOFOL  N/A 05/07/2019   Procedure: ESOPHAGOGASTRODUODENOSCOPY (EGD) WITH PROPOFOL ;  Surgeon: Marshall Skeeter, MD;  Location: [facility] ENDOSCOPY;  Service: Endoscopy;  Laterality: N/A;   EYE SURGERY  2013   CATARACT EXTRACTION   GANGLION CYST EXCISION Right 05/18/2017   Procedure: REMOVAL GANGLION CYST ANKLE;  Surgeon: Anell Baptist, DPM;  Location: [facility] ORS;  Service: Podiatry;  Laterality: Right;   heart cath stent     LEFT HEART CATH AND CORONARY ANGIOGRAPHY Right 12/03/2017   Procedure: Left Heart Cath and Coronary Angiography with possible coronary intervention;  Surgeon: Cherrie Cornwall, MD;  Location: [facility] INVASIVE CV LAB;  Service: Cardiovascular;  Laterality: Right;   LEFT HEART CATH AND CORONARY ANGIOGRAPHY Left 07/27/2023   Procedure: LEFT HEART CATH AND CORONARY ANGIOGRAPHY;  Surgeon: Wenona Hamilton, MD;  Location: [facility] INVASIVE CV  LAB;  Service: Cardiovascular;  Laterality: Left;   PACEMAKER IMPLANT N/A 04/04/2021   Procedure: PACEMAKER IMPLANT;  Surgeon: Verona Goodwill, MD;  Location: [facility] INVASIVE CV LAB;  Service: Cardiovascular;  Laterality: N/A;   PROSTATE BIOPSY     RIGHT/LEFT HEART CATH AND CORONARY ANGIOGRAPHY N/A 02/07/2021   Procedure: RIGHT/LEFT HEART CATH AND CORONARY ANGIOGRAPHY;  Surgeon: Wenona Hamilton, MD;  Location: [facility] INVASIVE CV LAB;  Service: Cardiovascular;  Laterality: N/A;   stents     multiple    FAMILY HISTORY :   Family History  Problem Relation Age of Onset   Cancer Mother        gastric and lung   Cancer Father        multiple myeloma   Stroke Father    Cancer Sister         leukemia   Cancer Brother        leukemia   Cancer Brother        kidney   Cancer Daughter        Uterine   Cancer Other        [place] Clinical cytogeneticist Son): Prostate    SOCIAL HISTORY:   Social History   Tobacco Use   Smoking status: Former    Current packs/day: 0.00    Average packs/day: 1 pack/day for 35.0 years (35.0 ttl pk-yrs)    Types: Cigarettes    Start date: [birth year]/07/1949    Quit date: 03/06/1986    Years since quitting: 37.4   Smokeless tobacco: Former    Types: Chew    Quit date: 1985   Tobacco comments:    Started smoking around 88 yrs old.    Smoked 1 PPD at his heaviest.  Vaping Use   Vaping status: Never Used  Substance Use Topics   Alcohol use: No   Drug use: No    ALLERGIES:  is allergic to atorvastatin.  MEDICATIONS:  Current Outpatient Medications  Medication Sig Dispense Refill   Calcium  Carb-Cholecalciferol  (CALCIUM  + D3 PO) Take 1 tablet by mouth daily.     cholecalciferol  (VITAMIN D3) 25 MCG (1000 UNIT) tablet Take 1,000 Units by mouth daily.     cyanocobalamin  (VITAMIN B12) 1000 MCG tablet Take 1,000 mcg by mouth daily.     Fe Bisgly-Vit C-Vit B12-FA (GENTLE IRON  PO) Take 28 mg by mouth daily.     furosemide  (LASIX ) 40 MG tablet TAKE 1 TABLET BY MOUTH EVERY OTHER DAY AS DIRECTED 45 tablet 3   isosorbide  mononitrate (IMDUR ) 30 MG 24 hr tablet Take 1 tablet (30 mg total) by mouth daily. 90 tablet 2   Lacosamide (VIMPAT) 100 MG TABS Take 100 mg by mouth in the morning and at bedtime.     losartan  (COZAAR ) 25 MG tablet Take 1 tablet (25 mg total) by mouth daily. 90 tablet 3   lovastatin  (MEVACOR ) 40 MG tablet Take 2 tablets (80 mg total) by mouth at bedtime. 180 tablet 1   metoprolol  succinate (TOPROL -XL) 25 MG 24 hr tablet TAKE 1 TABLET BY MOUTH ONCE DAILY 90 tablet 3   Multiple Vitamin (MULTI-VITAMINS) TABS Take 1 tablet by mouth daily.      pantoprazole  (PROTONIX ) 40 MG tablet Take 40 mg by mouth 2 (two) times daily.      potassium chloride  (KLOR-CON ) 10  MEQ tablet TAKE 1 TABLET BY MOUTH EVERY OTHER DAY WITH FUROSEMID 45 tablet 3   apixaban  (ELIQUIS ) 5 MG TABS tablet Take 1 tablet (5 mg total) by  mouth 2 (two) times daily. 180 tablet 1   No current facility-administered medications for this visit.    PHYSICAL EXAMINATION: ECOG PERFORMANCE STATUS: 0 - Asymptomatic  BP (!) 100/51 (BP Location: Left Arm, Patient Position: Sitting, Cuff Size: Large)   Pulse 87   Temp 98.6 F (37 C) (Oral)   Resp 15   Wt 221 lb (100.2 kg)   SpO2 97%   BMI 31.71 kg/m   Filed Weights   08/14/23 1256  Weight: 221 lb (100.2 kg)    Patient in wheelchair; left leg in the brace  Physical Exam HENT:     Head: Normocephalic and atraumatic.     Mouth/Throat:     Pharynx: No oropharyngeal exudate.  Eyes:     Pupils: Pupils are equal, round, and reactive to light.  Cardiovascular:     Rate and Rhythm: Normal rate and regular rhythm.  Pulmonary:     Effort: No respiratory distress.     Breath sounds: No wheezing.  Abdominal:     General: Bowel sounds are normal. There is no distension.     Palpations: Abdomen is soft. There is no mass.     Tenderness: There is no abdominal tenderness. There is no guarding or rebound.  Musculoskeletal:        General: No tenderness. Normal range of motion.     Cervical back: Normal range of motion and neck supple.  Skin:    General: Skin is warm.  Neurological:     Mental Status: He is alert and oriented to person, place, and time.  Psychiatric:        Mood and Affect: Affect normal.     LABORATORY DATA:  I have reviewed the data as listed    Component Value Date/Time   NA 138 08/10/2023 0934   NA 140 07/20/2023 0859   NA 140 09/16/2013 0438   K 4.0 08/10/2023 0934   K 4.2 09/16/2013 0438   CL 109 08/10/2023 0934   CL 109 (H) 09/16/2013 0438   CO2 23 08/10/2023 0934   CO2 24 09/16/2013 0438   GLUCOSE 102 (H) 08/10/2023 0934   GLUCOSE 101 (H) 09/16/2013 0438   BUN 21 08/10/2023 0934   BUN 18  07/20/2023 0859   BUN 20 (H) 09/16/2013 0438   CREATININE 1.25 (H) 08/10/2023 0934   CREATININE 1.28 09/16/2013 0438   CALCIUM  8.8 (L) 08/10/2023 0934   CALCIUM  8.3 (L) 09/16/2013 0438   PROT 6.4 (L) 08/10/2023 0934   PROT 6.7 09/15/2013 0755   ALBUMIN 3.9 08/10/2023 0934   ALBUMIN 3.5 09/15/2013 0755   AST 20 08/10/2023 0934   ALT 13 08/10/2023 0934   ALT 28 09/15/2013 0755   ALKPHOS 43 08/10/2023 0934   ALKPHOS 44 (L) 09/15/2013 0755   BILITOT 0.7 08/10/2023 0934   GFRNONAA 56 (L) 08/10/2023 0934   GFRNONAA 54 (L) 09/16/2013 0438   GFRAA 59 (L) 10/07/2019 1305   GFRAA >60 09/16/2013 0438    No results found for: "SPEP", "UPEP"  Lab Results  Component Value Date   WBC 4.6 08/10/2023   NEUTROABS 2.8 08/10/2023   HGB 11.3 (L) 08/10/2023   HCT 34.2 (L) 08/10/2023   MCV 90.2 08/10/2023   PLT 169 08/10/2023      Chemistry      Component Value Date/Time   NA 138 08/10/2023 0934   NA 140 07/20/2023 0859   NA 140 09/16/2013 0438   K 4.0 08/10/2023 0934   K 4.2 09/16/2013  0438   CL 109 08/10/2023 0934   CL 109 (H) 09/16/2013 0438   CO2 23 08/10/2023 0934   CO2 24 09/16/2013 0438   BUN 21 08/10/2023 0934   BUN 18 07/20/2023 0859   BUN 20 (H) 09/16/2013 0438   CREATININE 1.25 (H) 08/10/2023 0934   CREATININE 1.28 09/16/2013 0438      Component Value Date/Time   CALCIUM  8.8 (L) 08/10/2023 0934   CALCIUM  8.3 (L) 09/16/2013 0438   ALKPHOS 43 08/10/2023 0934   ALKPHOS 44 (L) 09/15/2013 0755   AST 20 08/10/2023 0934   ALT 13 08/10/2023 0934   ALT 28 09/15/2013 0755   BILITOT 0.7 08/10/2023 0934       RADIOGRAPHIC STUDIES: I have personally reviewed the radiological images as listed and agreed with the findings in the report. No results found.   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  Currently getting intermittent Eligard .  MAY 2025-- PSA <0.01.  Hold Eligard  today.  If significantly elevated would recommend Eligard . If PSA rising would consider bone scan.  Stable.   # Iron  deficient anemia/CKD- III-[s/p EGD March 2021]--status post IV iron  infusion x1 [FEB 2022]-MARCH 2025--hemoglobin is 11-12; ferritin-14  Overall stable.  Continue gentle iron  [iron  biglycinate; 28 mg ] 1 pill a day.  Venofer  today-  # Nov 26th, 2023-acute PE -lobar and segmental PE [[facility]]/ A.fib - on Eliquis  [no asprin/plavix ]-provoked left foot surgery. However, pt on indefinite Eliquis  5 mg twice daily. Stable.   # TIA-Dec 2024/ [Hx of stroke]- MRI- [facility] [Dr.Hamlin]- negative for any new stroke; old strokes-awaiing EEG- APRIL 2025- his routine EEG, performed during awake and asleep state, is largely normal. There were no seizures, epileptiform activity, or push button events. ; again discussed re: bleeding precautions on eliquis .   Stable. On Vimpat [neurology-[facility]]   #CKD stage III-GFR 40-50s. Clinically Stable.   Eligard  22.5 q4 m- ? nov 2023.  # DISPOSITION:  #  add to med list lacosamide (VIMPAT) 100 mg Tab  Take 1 tablet (100 mg total) by mouth two (2) times a day.   # HOLD eligard ; #  Venofer  today # Venofer  in 1 month- # Follow up in 3 months-  MD; 2-3 days prior- labs- cbc.cmp/iron  studies; ferritin; PSA eligard ; possible venofer - Dr.B  Cc; Dr.Scott/  Orders Placed This Encounter  Procedures   CBC with Differential (Cancer Center Only)    Standing Status:   Future    Expected Date:   11/13/2023    Expiration Date:   08/13/2024   CMP (Cancer Center only)    Standing Status:   Future    Expected Date:   11/13/2023    Expiration Date:   08/13/2024   Iron  and TIBC    Standing Status:   Future    Expected Date:   11/13/2023    Expiration Date:   08/13/2024   Ferritin    Standing Status:   Future    Expected Date:   11/13/2023    Expiration Date:   08/13/2024   PSA    Standing Status:   Future    Expected Date:   11/13/2023     Expiration Date:   08/13/2024   All questions were answered. The patient knows to call the clinic with any problems, questions or concerns.      Gwyn Leos, MD 08/14/2023 2:01 PM

## 2023-08-14 NOTE — Progress Notes (Unsigned)
 Cardiology Office Note   Date:  08/14/2023   ID:  Nathaniel Thompson, DOB May 29, 1935, MRN [id number]  PCP:  Dellar Fenton, MD  Cardiologist:   Antionette Kirks, MD   Chief Complaint  Patient presents with   Follow-up    Post cath no complaints today. Meds reviewed verbally with pt.        History of Present Illness: Nathaniel Thompson is a 88 y.o. male who presents for a follow-up visit regarding coronary artery disease and history of pacemaker. He has chronic medical conditions including TIA, previous tobacco use, metastatic prostate cancer, small abdominal aortic aneurysm followed by vascular surgery, chronic kidney disease, hyperlipidemia and essential hypertension.  He has known history of coronary artery disease status post stenting of the left circumflex and RCA.  He had initial PCI done in 2003 at [facility].  He had repeat PCI x2 at [facility] in 2004.   He was hospitalized in September 2019 with questionable tachycardia and atrial fibrillation.  Initially was placed on amiodarone  and anticoagulation but all his EKGs revealed sinus rhythm at that time and thus both amiodarone  and Eliquis  were discontinued.     Echocardiogram in March 2020 showed normal LV systolic function with grade 1 diastolic dysfunction, severely dilated left atrium, mild mitral and tricuspid regurgitation and mild pulmonary hypertension.  He had worsening chest pain and shortness of breath in December 2022.  Cardiac catheterization showed patent stents in the left circumflex and right coronary artery with no obstructive coronary artery disease.  Right heart catheterization showed mildly elevated filling pressures, mild pulmonary hypertension and normal cardiac output.  The patient was noted to have intermittent bradycardia during cardiac catheterization with heart rate going to the low 40s.    He underwent outpatient monitor which showed frequent episodes of SVT.  He was referred to EP and underwent pacemaker  placement in January 2023.  In addition, he was placed on furosemide  for diastolic heart failure.  He fell from a deer stand in late 2023 and fractured his left posterior calcaneus.  He was subsequently hospitalized at [facility]  with shortness of breath, cough and fever in the setting of his  displaced fracture of left posterior calcaneus.  He was found to have RSV and lobar and segmental pulmonary embolism.    He was treated with anticoagulation.  He was noted to have episodes of tachycardia and was found to be in atrial fibrillation with rapid ventricular response.  This lasted for about 45 minutes.  He was placed on small dose metoprolol .    He was hospitalized at [facility] in December, 2024 with slurred speech, right arm weakness and suspected stroke.  tPA was not given given that he is on Eliquis .  He was hypertensive on presentation.  MRI of the brain showed no acute stroke.  Echocardiogram showed normal LV systolic function with no significant valvular abnormalities.  The patient was thought to have TIA.  He was seen recently for increased shortness of breath and decreased exercise tolerance.  Cardiac PET scan was performed and showed anterolateral and inferior lateral ischemia. Left heart catheterization was performed last month which showed patent RCA and left circumflex stents with minimal restenosis.  There was mild nonobstructive disease overall. Echocardiogram was done recently and showed normal LV systolic function with grade 1 diastolic dysfunction and mild mitral regurgitation.  He is doing reasonably well with no chest pain.  His other symptoms are stable.    Past Medical History:  Diagnosis Date   (  HFpEF) heart failure with preserved ejection fraction (HCC)    a. 05/2018 Echo: Nl EF, Gr1 DD, sev dil LA, mild MR/TR, mild PAH; b. 05/2019 Echo: EF 55-60%, no rwma, mild LVH. Nl PASP. Mildly dil LA. Triv MR. Mild AoV sclerosis w/o stenosis. Mildly dil Asc AO (39mm); d. 02/2023 Echo [facility]): EF >55%,  nl RV fxn, mild PR.   AAA (abdominal aortic aneurysm) (HCC)    Allergic rhinitis    Barrett's esophagus 10/21/2013   Bone cancer (HCC)    CKD (chronic kidney disease), stage III (HCC)    Colon polyp, hyperplastic    COPD (chronic obstructive pulmonary disease) (HCC)    mild COPD. former smoker   Coronary artery disease    a. 2003 s/p PCI ([facility]); b. 2004 s/p PCI x 2 ([facility]); c. 11/2017 Cath: LM nl, LAD mild dzs, LCX patent stent w/ 30% distal edge restenosis, RCA patent distal stent w/ 30% prox edge stenosis. Nl EF->Med Rx; d. 02/2021 Cath: LM nl, LAD min irregs, D1/2 min irregs, LCX 10p ISR, 45m, RCA 64m/d ISR, EF 65%; e. 07/2023 PET Stress: antlat and inflat ischemia.   Diverticulosis    Fundic gland polyps of stomach, benign    Gastritis    GERD (gastroesophageal reflux disease)    Hypercholesteremia    Hypertension    PAF (paroxysmal atrial fibrillation) (HCC)    a. CHA2DS2VASc = 7-->eliquis .   Prostate cancer (HCC)    Prostatism    Pulmonary embolism (HCC)    a. 2023 - lobar and segmental PE in setting of RSV.   Reflux esophagitis    Renal stones    Skin cancer    left cheek/lesion excised   Sleep apnea    Tachy-brady syndrome (HCC)    a. 03/2021 s/p MDT MRI compatible DC PPM (ser # [id number]).   Tachycardia    a. 11/2017 admit w/ tachycardia/? afib-->no afib noted upon review (amio/OAC d/c'd).   TIA (transient ischemic attack)    a. 02/2023 admit [facility] - no stroke on MRI; b. 06/2022 EEG: nl for age.    Past Surgical History:  Procedure Laterality Date   CARDIAC CATHETERIZATION     COLONOSCOPY WITH PROPOFOL  N/A 09/13/2015   Procedure: COLONOSCOPY WITH PROPOFOL ;  Surgeon: Deveron Fly, MD;  Location: [facility] ENDOSCOPY;  Service: Endoscopy;  Laterality: N/A;   COLONOSCOPY WITH PROPOFOL  N/A 05/07/2019   Procedure: COLONOSCOPY WITH PROPOFOL ;  Surgeon: Marshall Skeeter, MD;  Location: [facility] ENDOSCOPY;  Service: Endoscopy;  Laterality: N/A;   CORONARY ANGIOPLASTY      ESOPHAGOGASTRODUODENOSCOPY (EGD) WITH PROPOFOL  N/A 09/13/2015   Procedure: ESOPHAGOGASTRODUODENOSCOPY (EGD) WITH PROPOFOL ;  Surgeon: Deveron Fly, MD;  Location: [facility] ENDOSCOPY;  Service: Endoscopy;  Laterality: N/A;   ESOPHAGOGASTRODUODENOSCOPY (EGD) WITH PROPOFOL  N/A 12/28/2015   Procedure: ESOPHAGOGASTRODUODENOSCOPY (EGD) WITH PROPOFOL ;  Surgeon: Deveron Fly, MD;  Location: [facility] ENDOSCOPY;  Service: Endoscopy;  Laterality: N/A;   ESOPHAGOGASTRODUODENOSCOPY (EGD) WITH PROPOFOL  N/A 06/16/2016   Procedure: ESOPHAGOGASTRODUODENOSCOPY (EGD) WITH PROPOFOL ;  Surgeon: Deveron Fly, MD;  Location: [facility] ENDOSCOPY;  Service: Endoscopy;  Laterality: N/A;   ESOPHAGOGASTRODUODENOSCOPY (EGD) WITH PROPOFOL  N/A 05/07/2019   Procedure: ESOPHAGOGASTRODUODENOSCOPY (EGD) WITH PROPOFOL ;  Surgeon: Marshall Skeeter, MD;  Location: [facility] ENDOSCOPY;  Service: Endoscopy;  Laterality: N/A;   EYE SURGERY  2013   CATARACT EXTRACTION   GANGLION CYST EXCISION Right 05/18/2017   Procedure: REMOVAL GANGLION CYST ANKLE;  Surgeon: Anell Baptist, DPM;  Location: [facility] ORS;  Service: Podiatry;  Laterality: Right;  heart cath stent     LEFT HEART CATH AND CORONARY ANGIOGRAPHY Right 12/03/2017   Procedure: Left Heart Cath and Coronary Angiography with possible coronary intervention;  Surgeon: Cherrie Cornwall, MD;  Location: [facility] INVASIVE CV LAB;  Service: Cardiovascular;  Laterality: Right;   LEFT HEART CATH AND CORONARY ANGIOGRAPHY Left 07/27/2023   Procedure: LEFT HEART CATH AND CORONARY ANGIOGRAPHY;  Surgeon: Wenona Hamilton, MD;  Location: [facility] INVASIVE CV LAB;  Service: Cardiovascular;  Laterality: Left;   PACEMAKER IMPLANT N/A 04/04/2021   Procedure: PACEMAKER IMPLANT;  Surgeon: Verona Goodwill, MD;  Location: [facility] INVASIVE CV LAB;  Service: Cardiovascular;  Laterality: N/A;   PROSTATE BIOPSY     RIGHT/LEFT HEART CATH AND CORONARY ANGIOGRAPHY N/A 02/07/2021   Procedure: RIGHT/LEFT HEART CATH AND CORONARY ANGIOGRAPHY;   Surgeon: Wenona Hamilton, MD;  Location: [facility] INVASIVE CV LAB;  Service: Cardiovascular;  Laterality: N/A;   stents     multiple     Current Outpatient Medications  Medication Sig Dispense Refill   apixaban  (ELIQUIS ) 5 MG TABS tablet Take 1 tablet (5 mg total) by mouth 2 (two) times daily. 180 tablet 1   Calcium  Carb-Cholecalciferol  (CALCIUM  + D3 PO) Take 1 tablet by mouth daily.     cholecalciferol  (VITAMIN D3) 25 MCG (1000 UNIT) tablet Take 1,000 Units by mouth daily.     cyanocobalamin  (VITAMIN B12) 1000 MCG tablet Take 1,000 mcg by mouth daily.     Fe Bisgly-Vit C-Vit B12-FA (GENTLE IRON  PO) Take 28 mg by mouth daily.     furosemide  (LASIX ) 40 MG tablet TAKE 1 TABLET BY MOUTH EVERY OTHER DAY AS DIRECTED 45 tablet 3   isosorbide  mononitrate (IMDUR ) 30 MG 24 hr tablet Take 1 tablet (30 mg total) by mouth daily. 90 tablet 2   Lacosamide (VIMPAT) 100 MG TABS Take 100 mg by mouth in the morning and at bedtime.     losartan  (COZAAR ) 25 MG tablet Take 1 tablet (25 mg total) by mouth daily. 90 tablet 3   lovastatin  (MEVACOR ) 40 MG tablet Take 2 tablets (80 mg total) by mouth at bedtime. 180 tablet 1   metoprolol  succinate (TOPROL -XL) 25 MG 24 hr tablet TAKE 1 TABLET BY MOUTH ONCE DAILY 90 tablet 3   Multiple Vitamin (MULTI-VITAMINS) TABS Take 1 tablet by mouth daily.      pantoprazole  (PROTONIX ) 40 MG tablet Take 40 mg by mouth 2 (two) times daily.      potassium chloride  (KLOR-CON ) 10 MEQ tablet TAKE 1 TABLET BY MOUTH EVERY OTHER DAY WITH FUROSEMID 45 tablet 3   No current facility-administered medications for this visit.    Allergies:   Atorvastatin    Social History:  The patient  reports that he quit smoking about 37 years ago. His smoking use included cigarettes. He started smoking about 72 years ago. He has a 35 pack-year smoking history. He quit smokeless tobacco use about 40 years ago.  His smokeless tobacco use included chew. He reports that he does not drink alcohol and does not  use drugs.   Family History:  The patient's family history includes Cancer in his brother, brother, daughter, father, mother, sister, and another family member; Stroke in his father.    ROS:  Please see the history of present illness.   Otherwise, review of systems are positive for none.   All other systems are reviewed and negative.    PHYSICAL EXAM: VS:  BP 130/78 (BP Location: Left Arm, Patient Position: Sitting, Cuff  Size: Normal)   Pulse 80   Ht 5' 10 (1.778 m)   Wt 222 lb 6 oz (100.9 kg)   SpO2 97%   BMI 31.91 kg/m  , BMI Body mass index is 31.91 kg/m. GEN: Well nourished, well developed, in no acute distress  HEENT: normal  Neck: no JVD, carotid bruits, or masses Cardiac: RRR; no murmurs, rubs, or gallops, mild bilateral lower extremity edema Respiratory:  clear to auscultation bilaterally, normal work of breathing GI: soft, nontender, nondistended, + BS MS: no deformity or atrophy  Skin: warm and dry, no rash Neuro:  Strength and sensation are intact Psych: euthymic mood, full affect Right radial pulse is normal with no hematoma.   EKG:  EKG is ordered today. The ekg ordered today demonstrates : Atrial-paced rhythm Lateral infarct , age undetermined When compared with ECG of 20-Jul-2023 08:01, Premature ventricular complexes are no longer Present QRS axis Shifted left Lateral infarct is now Present T wave inversion now evident in Lateral leads    Recent Labs: 05/16/2023: TSH 2.19 08/10/2023: ALT 13; BUN 21; Creatinine 1.25; Hemoglobin 11.3; Platelet Count 169; Potassium 4.0; Sodium 138    Lipid Panel    Component Value Date/Time   CHOL 143 05/16/2023 0919   CHOL 103 09/16/2013 0438   TRIG 120.0 05/16/2023 0919   TRIG 95 09/16/2013 0438   HDL 38.50 (L) 05/16/2023 0919   HDL 30 (L) 09/16/2013 0438   CHOLHDL 4 05/16/2023 0919   VLDL 24.0 05/16/2023 0919   VLDL 19 09/16/2013 0438   LDLCALC 80 05/16/2023 0919   LDLCALC 54 09/16/2013 0438      Wt  Readings from Last 3 Encounters:  08/14/23 222 lb 6 oz (100.9 kg)  08/14/23 221 lb (100.2 kg)  07/27/23 219 lb 11.2 oz (99.7 kg)          04/15/2019    2:58 PM  PAD Screen  Previous PAD dx? No  Previous surgical procedure? No  Pain with walking? No  Feet/toe relief with dangling? No  Painful, non-healing ulcers? No  Extremities discolored? No      ASSESSMENT AND PLAN:  1.  Coronary artery disease involving native coronary arteries without angina: Recent cardiac catheterization showed patent stents with no obstructive disease.  Continue medical therapy.  No antiplatelet medications given that he is on Eliquis .  2.  Chronic diastolic heart failure: He seems to be euvolemic on furosemide  40 mg every other day.  3.  Essential hypertension: Blood pressure is well-controlled on current medications.  4.  Hyperlipidemia:  History of intolerance to potent statins.  He tolerates lovastatin  80 mg daily.  5.  Small abdominal aortic aneurysm: Less than 4 cm.  Followed by vascular surgery.  This has been stable.   6.  Symptomatic bradycardia: Status post permanent pacemaker placement.  The pacemaker seems to be functioning normally.  7.  Paroxysmal atrial fibrillation: Continue anticoagulation with Eliquis .     Disposition: Follow-up in 6 months.   Signed,  Antionette Kirks, MD  08/14/2023 4:35 PM    [facility]

## 2023-08-14 NOTE — Patient Instructions (Signed)

## 2023-08-16 ENCOUNTER — Ambulatory Visit: Payer: Self-pay | Admitting: Cardiovascular Disease

## 2023-08-22 NOTE — Progress Notes (Signed)
 Remote pacemaker transmission.

## 2023-09-13 ENCOUNTER — Inpatient Hospital Stay: Attending: Internal Medicine

## 2023-09-13 VITALS — BP 113/68 | HR 76 | Temp 97.7°F | Resp 18

## 2023-09-13 DIAGNOSIS — C61 Malignant neoplasm of prostate: Secondary | ICD-10-CM

## 2023-09-13 DIAGNOSIS — D509 Iron deficiency anemia, unspecified: Secondary | ICD-10-CM | POA: Insufficient documentation

## 2023-09-13 DIAGNOSIS — N183 Chronic kidney disease, stage 3 unspecified: Secondary | ICD-10-CM | POA: Diagnosis not present

## 2023-09-13 MED ORDER — IRON SUCROSE 20 MG/ML IV SOLN
200.0000 mg | Freq: Once | INTRAVENOUS | Status: AC
  Administered 2023-09-13: 200 mg via INTRAVENOUS
  Filled 2023-09-13: qty 10

## 2023-09-13 NOTE — Patient Instructions (Signed)

## 2023-09-14 ENCOUNTER — Other Ambulatory Visit (INDEPENDENT_AMBULATORY_CARE_PROVIDER_SITE_OTHER)

## 2023-09-14 DIAGNOSIS — R739 Hyperglycemia, unspecified: Secondary | ICD-10-CM | POA: Diagnosis not present

## 2023-09-14 DIAGNOSIS — I10 Essential (primary) hypertension: Secondary | ICD-10-CM

## 2023-09-14 DIAGNOSIS — E7800 Pure hypercholesterolemia, unspecified: Secondary | ICD-10-CM | POA: Diagnosis not present

## 2023-09-14 LAB — BASIC METABOLIC PANEL WITH GFR
BUN: 21 mg/dL (ref 6–23)
CO2: 28 meq/L (ref 19–32)
Calcium: 9.2 mg/dL (ref 8.4–10.5)
Chloride: 106 meq/L (ref 96–112)
Creatinine, Ser: 1.33 mg/dL (ref 0.40–1.50)
GFR: 48.65 mL/min — ABNORMAL LOW (ref >60.00)
Glucose, Bld: 100 mg/dL — ABNORMAL HIGH (ref 70–99)
Potassium: 4.3 meq/L (ref 3.5–5.1)
Sodium: 139 meq/L (ref 135–145)

## 2023-09-14 LAB — LIPID PANEL
Cholesterol: 121 mg/dL (ref 0–200)
HDL: 31.6 mg/dL — ABNORMAL LOW (ref >39.00)
LDL Cholesterol: 67 mg/dL (ref 0–99)
NonHDL: 88.94
Total CHOL/HDL Ratio: 4
Triglycerides: 108 mg/dL (ref 0.0–149.0)
VLDL: 21.6 mg/dL (ref 0.0–40.0)

## 2023-09-14 LAB — HEPATIC FUNCTION PANEL
ALT: 15 U/L (ref 0–53)
AST: 20 U/L (ref 0–37)
Albumin: 4 g/dL (ref 3.5–5.2)
Alkaline Phosphatase: 42 U/L (ref 39–117)
Bilirubin, Direct: 0.1 mg/dL (ref 0.0–0.3)
Total Bilirubin: 0.5 mg/dL (ref 0.2–1.2)
Total Protein: 6.3 g/dL (ref 6.0–8.3)

## 2023-09-14 LAB — HEMOGLOBIN A1C: Hgb A1c MFr Bld: 5.7 % (ref 4.6–6.5)

## 2023-09-16 ENCOUNTER — Ambulatory Visit: Payer: Self-pay | Admitting: Internal Medicine

## 2023-09-18 ENCOUNTER — Ambulatory Visit: Admitting: Internal Medicine

## 2023-09-18 ENCOUNTER — Telehealth: Payer: Self-pay

## 2023-09-18 NOTE — Telephone Encounter (Signed)
 Copied from CRM [id number]. Topic: Clinical - Prescription Issue >> Sep 18, 2023 10:53 AM Deleta RAMAN wrote: Reason for CRM: prescription issue that is being sent to neurologist in [place]. No replies from neurologist. Patient wife is hope that MD Glendia can sign off on the prescription instead due to the patient being low on medication. The medication is Vimpat 100 Mg. The patient has an upcoming appointment with Dr.Scott on July 21st.

## 2023-09-19 ENCOUNTER — Other Ambulatory Visit: Payer: Self-pay

## 2023-09-19 ENCOUNTER — Telehealth: Payer: Self-pay | Admitting: Cardiovascular Disease

## 2023-09-19 ENCOUNTER — Telehealth: Payer: Self-pay

## 2023-09-19 NOTE — Telephone Encounter (Signed)
 Pt c/o Shortness Of Breath: STAT if SOB developed within the last 24 hours or pt is noticeably SOB on the phone  1. Are you currently SOB (can you hear that pt is SOB on the phone)? No  2. How long have you been experiencing SOB? Last night  3. Are you SOB when sitting or when up moving around? Bending over, w/ exertion  4. Are you currently experiencing any other symptoms? HR 122

## 2023-09-19 NOTE — Telephone Encounter (Unsigned)
 Copied from CRM [id number]. Topic: Clinical - Prescription Issue >> Sep 18, 2023 10:53 AM Deleta RAMAN wrote: Reason for CRM: prescription issue that is being sent to neurologist in [place]. No replies from neurologist. Patient wife is hope that MD Glendia can sign off on the prescription instead due to the patient being low on medication. The medication is Vimpat 100 Mg. The patient has an upcoming appointment with Dr.Scott on July 21st. >> Sep 19, 2023  7:43 AM Robinson H wrote: Patients wife Gaetana calling to notify provider that the neurologist has taken care of the medication so Dr. Glendia doesn't need to do anything.

## 2023-09-19 NOTE — Telephone Encounter (Signed)
 Noted

## 2023-09-19 NOTE — Telephone Encounter (Signed)
 He had recent cardiac workup that included cardiac catheterization and echocardiogram and both were unremarkable.  I suggest that he follows with his primary care physician to see if there is something else going on.

## 2023-09-19 NOTE — Telephone Encounter (Signed)
 Called and spoke with the patient's wife, Nathaniel Thompson, about patient's symptoms.  She reports blood pressures in the 130s/80s and 90s with heart rate in high 110s to 120s.  These return back to normal range within a short time with heart rate returning to 90s and blood pressures returning to 120s/80s.  Nathaniel Thompson also reports that patient had 3 episodes of shortness of breath since yesterday, that resolved quickly.  Patient is not experiencing any shortness of breath, chest pain, lightheadedness, or dizziness at this time. Patient states his heart monitor shows normal function. Patient was advised that if the shortness of breath symptoms return, or if he develops any chest pain, or dizziness or any other concerning symptoms, to go to the emergency room, or call 911.  Glenda verbalized understanding with all questions and concerns addressed at this time.

## 2023-09-19 NOTE — Telephone Encounter (Signed)
 Copied from CRM [id number]. Topic: Clinical - Prescription Issue >> Sep 18, 2023 10:53 AM Deleta RAMAN wrote: Reason for CRM: prescription issue that is being sent to neurologist in [place]. No replies from neurologist. Patient wife is hope that MD Glendia can sign off on the prescription instead due to the patient being low on medication. The medication is Vimpat 100 Mg. The patient has an upcoming appointment with Dr.Scott on July 21st.

## 2023-09-20 ENCOUNTER — Telehealth: Payer: Self-pay

## 2023-09-20 NOTE — Telephone Encounter (Signed)
 See other message from patient. Wife called to let us  know that neurology took care of refill and nothing further needed.

## 2023-09-20 NOTE — Telephone Encounter (Signed)
 Call placed back to the patient and his wife. The patient was set up with Dr. Darliss tomorrow by a device nurse due to Atrial Flutter. He has been advised to keep that appointment.

## 2023-09-20 NOTE — Telephone Encounter (Signed)
 Please call and let her know that I am out of the office this week and this is reason for delay in returning message.  Also, in reviewing the chart, it appears she has talked to neurology at [facility] (Dr Marit) and they have refilled his medication and are arranging follow up. Please confirm with pt that they were able to get the refill.

## 2023-09-20 NOTE — Telephone Encounter (Signed)
 Pt wife called in stating that he has been having high heart rates and is going to send in a transmission to see if anything abnormal was seen. Pt wife will like a call back once received

## 2023-09-20 NOTE — Telephone Encounter (Signed)
 Outreach made to Pt.  Spoke with Pt's wife.  Pt has not felt well the last few days.  Complains of SOB/fatigue with minimal activity.  Abnormal transmission.  Pt scheduled with DOD 09/21/2023 at 3:40  pm at [facility].  Transmission received.  Pt in atrial flutter with RVR.  Pt has been out of rhythm 47 hours at this point.

## 2023-09-21 ENCOUNTER — Encounter: Payer: Self-pay | Admitting: Cardiology

## 2023-09-21 ENCOUNTER — Ambulatory Visit: Attending: Cardiology | Admitting: Cardiology

## 2023-09-21 ENCOUNTER — Other Ambulatory Visit: Payer: Self-pay

## 2023-09-21 ENCOUNTER — Emergency Department

## 2023-09-21 ENCOUNTER — Emergency Department
Admission: EM | Admit: 2023-09-21 | Discharge: 2023-09-21 | Disposition: A | Discharge status: Home / Self Care | Source: Ambulatory Visit | Attending: Emergency Medicine | Admitting: Emergency Medicine

## 2023-09-21 VITALS — BP 96/63 | HR 139 | Ht 70.0 in | Wt 221.2 lb

## 2023-09-21 DIAGNOSIS — I2510 Atherosclerotic heart disease of native coronary artery without angina pectoris: Secondary | ICD-10-CM | POA: Diagnosis not present

## 2023-09-21 DIAGNOSIS — R002 Palpitations: Secondary | ICD-10-CM | POA: Diagnosis present

## 2023-09-21 DIAGNOSIS — Z950 Presence of cardiac pacemaker: Secondary | ICD-10-CM | POA: Diagnosis not present

## 2023-09-21 DIAGNOSIS — I4892 Unspecified atrial flutter: Secondary | ICD-10-CM

## 2023-09-21 HISTORY — DX: Unspecified atrial fibrillation: I48.91

## 2023-09-21 LAB — CBC
HCT: 37.1 % — ABNORMAL LOW (ref 39.0–52.0)
Hemoglobin: 12.6 g/dL — ABNORMAL LOW (ref 13.0–17.0)
MCH: 30.2 pg (ref 26.0–34.0)
MCHC: 34 g/dL (ref 30.0–36.0)
MCV: 89 fL (ref 80.0–100.0)
Platelets: 203 K/uL (ref 150–400)
RBC: 4.17 MIL/uL — ABNORMAL LOW (ref 4.22–5.81)
RDW: 13.3 % (ref 11.5–15.5)
WBC: 5.4 K/uL (ref 4.0–10.5)
nRBC: 0 % (ref 0.0–0.2)

## 2023-09-21 LAB — BASIC METABOLIC PANEL WITH GFR
Anion gap: 11 (ref 5–15)
BUN: 21 mg/dL (ref 8–23)
CO2: 19 mmol/L — ABNORMAL LOW (ref 22–32)
Calcium: 9.4 mg/dL (ref 8.9–10.3)
Chloride: 111 mmol/L (ref 98–111)
Creatinine, Ser: 1.39 mg/dL — ABNORMAL HIGH (ref 0.61–1.24)
GFR, Estimated: 50 mL/min — ABNORMAL LOW (ref >60)
Glucose, Bld: 132 mg/dL — ABNORMAL HIGH (ref 70–99)
Potassium: 4 mmol/L (ref 3.5–5.1)
Sodium: 141 mmol/L (ref 135–145)

## 2023-09-21 LAB — TROPONIN I (HIGH SENSITIVITY): Troponin I (High Sensitivity): 15 ng/L (ref <18)

## 2023-09-21 MED ORDER — MIDAZOLAM HCL 2 MG/2ML IJ SOLN
2.0000 mg | Freq: Once | INTRAMUSCULAR | Status: DC
  Filled 2023-09-21: qty 2

## 2023-09-21 MED ORDER — FENTANYL CITRATE PF 50 MCG/ML IJ SOSY
50.0000 ug | PREFILLED_SYRINGE | Freq: Once | INTRAMUSCULAR | Status: DC
  Filled 2023-09-21: qty 1

## 2023-09-21 MED ORDER — MIDAZOLAM HCL 2 MG/2ML IJ SOLN
INTRAMUSCULAR | Status: AC | PRN
  Administered 2023-09-21: 2 mg via INTRAVENOUS

## 2023-09-21 MED ORDER — FENTANYL CITRATE (PF) 100 MCG/2ML IJ SOLN
INTRAMUSCULAR | Status: AC | PRN
  Administered 2023-09-21: 50 ug via INTRAVENOUS

## 2023-09-21 NOTE — Progress Notes (Signed)
 Cardiology Office Note:    Date:  09/21/2023   ID:  Nathaniel Thompson, DOB [birth year], MRN [id number]  PCP:  Glendia Shad, MD   [facility] Providers Cardiologist:  Deatrice Cage, MD     Referring MD: Glendia Shad, MD   Chief Complaint  Patient presents with   Atrial Fibrillation    Patient's heart rate has been out of rhythms. Patient states that he has no energy. Patient states that he has had a lot of indigestion. Patient states that if he looks up while standing he gets light headed. Patient feels short of breath on exertion. Meds reviewed.     History of Present Illness:    Nathaniel Thompson is a 88 y.o. male with a hx of CAD s/p PCI RCA, LCx, symptomatic bradycardia s/p permanent pacemaker 03/2021, paroxysmal atrial fibrillation who presents due to lack of energy, tachycardia.  States not feeling well over the past 3 to 4 days with symptoms of fatigue.,  Tried to have a bowel movement 3 days ago felt like there was blood rushing to his head.  Remote monitoring on his cardiac monitor showed atrial flutter.  He endorsed taking his medications as prescribed, has not missed any doses of Eliquis  over the past month.  Also take Toprol -XL 25 mg daily as prescribed.  Past Medical History:  Diagnosis Date   (HFpEF) heart failure with preserved ejection fraction (HCC)    a. 05/2018 Echo: Nl EF, Gr1 DD, sev dil LA, mild MR/TR, mild PAH; b. 05/2019 Echo: EF 55-60%, no rwma, mild LVH. Nl PASP. Mildly dil LA. Triv MR. Mild AoV sclerosis w/o stenosis. Mildly dil Asc AO (39mm); d. 02/2023 Echo [facility]): EF >55%, nl RV fxn, mild PR.   AAA (abdominal aortic aneurysm) (HCC)    Allergic rhinitis    Atrial fibrillation (HCC)    Barrett's esophagus 10/21/2013   Bone cancer (HCC)    CKD (chronic kidney disease), stage III (HCC)    Colon polyp, hyperplastic    COPD (chronic obstructive pulmonary disease) (HCC)    mild COPD. former smoker   Coronary artery disease    a. 2003 s/p PCI ([facility]);  b. 2004 s/p PCI x 2 ([facility]); c. 11/2017 Cath: LM nl, LAD mild dzs, LCX patent stent w/ 30% distal edge restenosis, RCA patent distal stent w/ 30% prox edge stenosis. Nl EF->Med Rx; d. 02/2021 Cath: LM nl, LAD min irregs, D1/2 min irregs, LCX 10p ISR, 53m, RCA 79m/d ISR, EF 65%; e. 07/2023 PET Stress: antlat and inflat ischemia.   Diverticulosis    Fundic gland polyps of stomach, benign    Gastritis    GERD (gastroesophageal reflux disease)    Hypercholesteremia    Hypertension    PAF (paroxysmal atrial fibrillation) (HCC)    a. CHA2DS2VASc = 7-->eliquis .   Prostate cancer (HCC)    Prostatism    Pulmonary embolism (HCC)    a. 2023 - lobar and segmental PE in setting of RSV.   Reflux esophagitis    Renal stones    Skin cancer    left cheek/lesion excised   Sleep apnea    Tachy-brady syndrome (HCC)    a. 03/2021 s/p MDT MRI compatible DC PPM (ser # [id number]).   Tachycardia    a. 11/2017 admit w/ tachycardia/? afib-->no afib noted upon review (amio/OAC d/c'd).   TIA (transient ischemic attack)    a. 02/2023 admit [facility] - no stroke on MRI; b. 06/2022 EEG: nl for age.    Past  Surgical History:  Procedure Laterality Date   CARDIAC CATHETERIZATION     COLONOSCOPY WITH PROPOFOL  N/A 09/13/2015   Procedure: COLONOSCOPY WITH PROPOFOL ;  Surgeon: Gladis RAYMOND Mariner, MD;  Location: [facility] ENDOSCOPY;  Service: Endoscopy;  Laterality: N/A;   COLONOSCOPY WITH PROPOFOL  N/A 05/07/2019   Procedure: COLONOSCOPY WITH PROPOFOL ;  Surgeon: Dessa Reyes ORN, MD;  Location: [facility] ENDOSCOPY;  Service: Endoscopy;  Laterality: N/A;   CORONARY ANGIOPLASTY     ESOPHAGOGASTRODUODENOSCOPY (EGD) WITH PROPOFOL  N/A 09/13/2015   Procedure: ESOPHAGOGASTRODUODENOSCOPY (EGD) WITH PROPOFOL ;  Surgeon: Gladis RAYMOND Mariner, MD;  Location: [facility] ENDOSCOPY;  Service: Endoscopy;  Laterality: N/A;   ESOPHAGOGASTRODUODENOSCOPY (EGD) WITH PROPOFOL  N/A 12/28/2015   Procedure: ESOPHAGOGASTRODUODENOSCOPY (EGD) WITH PROPOFOL ;  Surgeon: Gladis RAYMOND Mariner, MD;  Location: [facility] ENDOSCOPY;  Service: Endoscopy;  Laterality: N/A;   ESOPHAGOGASTRODUODENOSCOPY (EGD) WITH PROPOFOL  N/A 06/16/2016   Procedure: ESOPHAGOGASTRODUODENOSCOPY (EGD) WITH PROPOFOL ;  Surgeon: Gladis RAYMOND Mariner, MD;  Location: [facility] ENDOSCOPY;  Service: Endoscopy;  Laterality: N/A;   ESOPHAGOGASTRODUODENOSCOPY (EGD) WITH PROPOFOL  N/A 05/07/2019   Procedure: ESOPHAGOGASTRODUODENOSCOPY (EGD) WITH PROPOFOL ;  Surgeon: Dessa Reyes ORN, MD;  Location: [facility] ENDOSCOPY;  Service: Endoscopy;  Laterality: N/A;   EYE SURGERY  2013   CATARACT EXTRACTION   GANGLION CYST EXCISION Right 05/18/2017   Procedure: REMOVAL GANGLION CYST ANKLE;  Surgeon: Ashley Soulier, DPM;  Location: [facility] ORS;  Service: Podiatry;  Laterality: Right;   heart cath stent     LEFT HEART CATH AND CORONARY ANGIOGRAPHY Right 12/03/2017   Procedure: Left Heart Cath and Coronary Angiography with possible coronary intervention;  Surgeon: Fernand Denyse LABOR, MD;  Location: [facility] INVASIVE CV LAB;  Service: Cardiovascular;  Laterality: Right;   LEFT HEART CATH AND CORONARY ANGIOGRAPHY Left 07/27/2023   Procedure: LEFT HEART CATH AND CORONARY ANGIOGRAPHY;  Surgeon: Darron Deatrice LABOR, MD;  Location: [facility] INVASIVE CV LAB;  Service: Cardiovascular;  Laterality: Left;   PACEMAKER IMPLANT N/A 04/04/2021   Procedure: PACEMAKER IMPLANT;  Surgeon: Fernande Elspeth BROCKS, MD;  Location: [facility] INVASIVE CV LAB;  Service: Cardiovascular;  Laterality: N/A;   PROSTATE BIOPSY     RIGHT/LEFT HEART CATH AND CORONARY ANGIOGRAPHY N/A 02/07/2021   Procedure: RIGHT/LEFT HEART CATH AND CORONARY ANGIOGRAPHY;  Surgeon: Darron Deatrice LABOR, MD;  Location: [facility] INVASIVE CV LAB;  Service: Cardiovascular;  Laterality: N/A;   stents     multiple    Current Medications: Current Meds  Medication Sig   apixaban  (ELIQUIS ) 5 MG TABS tablet Take 1 tablet (5 mg total) by mouth 2 (two) times daily.   Calcium  Carb-Cholecalciferol  (CALCIUM  + D3 PO) Take 1 tablet by mouth daily.    cholecalciferol  (VITAMIN D3) 25 MCG (1000 UNIT) tablet Take 1,000 Units by mouth daily.   cyanocobalamin  (VITAMIN B12) 1000 MCG tablet Take 1,000 mcg by mouth daily.   Fe Bisgly-Vit C-Vit B12-FA (GENTLE IRON  PO) Take 28 mg by mouth daily.   furosemide  (LASIX ) 40 MG tablet TAKE 1 TABLET BY MOUTH EVERY OTHER DAY AS DIRECTED   isosorbide  mononitrate (IMDUR ) 30 MG 24 hr tablet Take 1 tablet (30 mg total) by mouth daily.   Lacosamide (VIMPAT) 100 MG TABS Take 100 mg by mouth in the morning and at bedtime.   losartan  (COZAAR ) 25 MG tablet Take 1 tablet (25 mg total) by mouth daily.   lovastatin  (MEVACOR ) 40 MG tablet Take 2 tablets (80 mg total) by mouth at bedtime.   metoprolol  succinate (TOPROL -XL) 25 MG 24 hr tablet TAKE 1 TABLET BY MOUTH ONCE DAILY  Multiple Vitamin (MULTI-VITAMINS) TABS Take 1 tablet by mouth daily.    pantoprazole  (PROTONIX ) 40 MG tablet Take 40 mg by mouth 2 (two) times daily.    potassium chloride  (KLOR-CON ) 10 MEQ tablet TAKE 1 TABLET BY MOUTH EVERY OTHER DAY WITH FUROSEMID     Allergies:   Atorvastatin   Social History   Socioeconomic History   Marital status: Married    Spouse name: Glenda   Number of children: 2   Years of education: Not on file   Highest education level: Not on file  Occupational History   Occupation: retired Production designer, theatre/television/film for [employer]  Tobacco Use   Smoking status: Former    Current packs/day: 0.00    Average packs/day: 1 pack/day for 35.0 years (35.0 ttl pk-yrs)    Types: Cigarettes    Start date: 03/07/1951    Quit date: 03/06/1986    Years since quitting: 37.5   Smokeless tobacco: Former    Types: Chew    Quit date: 1985   Tobacco comments:    Started smoking around 88 yrs old.    Smoked 1 PPD at his heaviest.  Vaping Use   Vaping status: Never Used  Substance and Sexual Activity   Alcohol use: No   Drug use: No   Sexual activity: Not Currently  Other Topics Concern   Not on file  Social History Narrative   Married, Gaetana  2 step children and 2 children from previous marriage   Social Drivers of Corporate investment banker Strain: Low Risk  (10/19/2022)   Overall Financial Resource Strain (CARDIA)    Difficulty of Paying Living Expenses: Not hard at all  Food Insecurity: No Food Insecurity (10/19/2022)   Hunger Vital Sign    Worried About Running Out of Food in the Last Year: Never true    Ran Out of Food in the Last Year: Never true  Transportation Needs: No Transportation Needs (10/19/2022)   PRAPARE - Administrator, Civil Service (Medical): No    Lack of Transportation (Non-Medical): No  Physical Activity: Sufficiently Active (10/19/2022)   Exercise Vital Sign    Days of Exercise per Week: 5 days    Minutes of Exercise per Session: 30 min  Stress: No Stress Concern Present (10/19/2022)   [employer] of Occupational Health - Occupational Stress Questionnaire    Feeling of Stress : Not at all  Social Connections: Moderately Integrated (10/19/2022)   Social Connection and Isolation Panel    Frequency of Communication with Friends and Family: More than three times a week    Frequency of Social Gatherings with Friends and Family: Three times a week    Attends Religious Services: More than 4 times per year    Active Member of Clubs or Organizations: No    Attends Banker Meetings: Never    Marital Status: Married     Family History: The patient's family history includes Cancer in his brother, brother, daughter, father, mother, sister, and another family member; Stroke in his father.  ROS:   Please see the history of present illness.     All other systems reviewed and are negative.  EKGs/Labs/Other Studies Reviewed:    The following studies were reviewed today:  EKG Interpretation Date/Time:  Friday September 21 2023 15:44:26 EDT Ventricular Rate:  139 PR Interval:    QRS Duration:  96 QT Interval:  322 QTC Calculation: 489 R Axis:   -31  Text Interpretation: Atrial  flutter with  2:1 A-V conduction Left axis deviation Confirmed by Darliss Rogue ([id number]) on 09/21/2023 4:12:37 PM    Recent Labs: 05/16/2023: TSH 2.19 08/10/2023: Hemoglobin 11.3; Platelet Count 169 09/14/2023: ALT 15; BUN 21; Creatinine, Ser 1.33; Potassium 4.3; Sodium 139  Recent Lipid Panel    Component Value Date/Time   CHOL 121 09/14/2023 0746   CHOL 103 09/16/2013 0438   TRIG 108.0 09/14/2023 0746   TRIG 95 09/16/2013 0438   HDL 31.60 (L) 09/14/2023 0746   HDL 30 (L) 09/16/2013 0438   CHOLHDL 4 09/14/2023 0746   VLDL 21.6 09/14/2023 0746   VLDL 19 09/16/2013 0438   LDLCALC 67 09/14/2023 0746   LDLCALC 54 09/16/2013 0438     Risk Assessment/Calculations:             Physical Exam:    VS:  BP 96/63   Pulse (!) 139   Ht 5' 10 (1.778 m)   Wt 221 lb 3.2 oz (100.3 kg)   SpO2 95%   BMI 31.74 kg/m     Wt Readings from Last 3 Encounters:  09/21/23 221 lb 3.2 oz (100.3 kg)  08/14/23 222 lb 6 oz (100.9 kg)  08/14/23 221 lb (100.2 kg)     GEN:  Well nourished, well developed in no acute distress HEENT: Normal NECK: No JVD; No carotid bruits CARDIAC: Regular, tachycardic RESPIRATORY: Diminished breath sounds bilaterally ABDOMEN: Soft, non-tender, non-distended MUSCULOSKELETAL:  No edema; No deformity  SKIN: Warm and dry NEUROLOGIC:  Alert and oriented x 3 PSYCHIATRIC:  Normal affect   ASSESSMENT:    1. Atrial flutter, unspecified type (HCC)   2. Coronary artery disease involving native coronary artery of native heart without angina pectoris    PLAN:    In order of problems listed above:  Atrial flutter with RVR, heart rate 139.  ATP was attempted in the clinic without success.  Patient has not missed dose of Eliquis .  Recommend patient goes to the ED for possible DC cardioversion.  If cardioversion is successful, okay for discharge potentially. continue Eliquis  and Toprol -XL.  Will plan/schedule outpatient follow-up with EP. CAD s/p PCI.  Denies chest pain.   Continue Eliquis , Toprol -XL, lovastatin .  Follow-up in 4 to 6 weeks with Dr. Arida or APP       Medication Adjustments/Labs and Tests Ordered: Current medicines are reviewed at length with the patient today.  Concerns regarding medicines are outlined above.  Orders Placed This Encounter  Procedures   EKG 12-Lead   No orders of the defined types were placed in this encounter.   There are no Patient Instructions on file for this visit.   Signed, Rogue Darliss, MD  09/21/2023 4:39 PM    [facility]

## 2023-09-21 NOTE — ED Notes (Signed)
 Blue top sent to lab.

## 2023-09-21 NOTE — Discharge Instructions (Addendum)
 Return to the ER immediately for new, worsening, or persistent severe weakness or lightheadedness, chest pain, difficulty breathing, palpitations, or any other new or worsening symptoms that concern you.  Follow-up with your cardiologist.

## 2023-09-21 NOTE — Sedation Documentation (Signed)
Pt shocked at 100 J 

## 2023-09-21 NOTE — ED Notes (Signed)
 SOB since Tuesday 7/15

## 2023-09-21 NOTE — ED Triage Notes (Signed)
 Patient's wife states patient was sent over from [facility] for a-flutter; patient states he doesn't have any energy.

## 2023-09-21 NOTE — ED Provider Notes (Signed)
 [facility] Provider Note    Event Date/Time   First MD Initiated Contact with Patient 09/21/23 1640     (approximate)   History   Palpitations   HPI  Nathaniel Thompson is a 88 y.o. male with CAD status post PCI, symptomatic bradycardia status post permanent pacemaker in 2023 and paroxysmal atrial fibrillation who presents with generalized fatigue for the last 3 days associated with palpitations and some shortness of breath.  The patient denies any chest pain.  He went to the cardiology office today and was found to be in atrial flutter.    I reviewed the past medical records including the note from Dr. Darliss from cardiology from today notes that ATP was attempted in the office and was unsuccessful.  The patient was sent to the ED for cardioversion.  He has been compliant with his Eliquis .   Physical Exam   Triage Vital Signs: ED Triage Vitals [09/21/23 1632]  Encounter Vitals Group     BP 120/71     Girls Systolic BP Percentile      Girls Diastolic BP Percentile      Boys Systolic BP Percentile      Boys Diastolic BP Percentile      Pulse Rate (!) 141     Resp 20     Temp 97.8 F (36.6 C)     Temp Source Oral     SpO2 98 %     Weight      Height      Head Circumference      Peak Flow      Pain Score 0     Pain Loc      Pain Education      Exclude from Growth Chart     Most recent vital signs: Vitals:   09/21/23 1815 09/21/23 1845  BP: (!) 139/94 104/77  Pulse: 79 78  Resp: (!) 23 20  Temp: 97.8 F (36.6 C)   SpO2: 98% 100%     General: Alert, weak appearing, no distress.  CV:  Good peripheral perfusion.  Resp:  Normal effort.  Abd:  No distention.  Other:  No peripheral edema.   ED Results / Procedures / Treatments   Labs (all labs ordered are listed, but only abnormal results are displayed) Labs Reviewed  BASIC METABOLIC PANEL WITH GFR - Abnormal; Notable for the following components:      Result Value   CO2 19  (*)    Glucose, Bld 132 (*)    Creatinine, Ser 1.39 (*)    GFR, Estimated 50 (*)    All other components within normal limits  CBC - Abnormal; Notable for the following components:   RBC 4.17 (*)    Hemoglobin 12.6 (*)    HCT 37.1 (*)    All other components within normal limits  TROPONIN I (HIGH SENSITIVITY)     EKG  ED ECG REPORT I, Waylon Cassis, the attending physician, personally viewed and interpreted this ECG.  Date: 09/21/2023 EKG Time: 1544 Rate: 139 Rhythm: Atrial flutter QRS Axis: Left axis Intervals: normal ST/T Wave abnormalities: normal Narrative Interpretation: no evidence of acute ischemia  ED ECG REPORT I, Waylon Cassis, the attending physician, personally viewed and interpreted this ECG.  Date: 09/21/2023 EKG Time: 1722 Rate: 97 Rhythm: normal sinus rhythm (incorrectly read by machine as 3:1 atrial flutter)  QRS Axis: Borderline left axis Intervals: normal ST/T Wave abnormalities: normal Narrative Interpretation: no evidence of acute ischemia  RADIOLOGY  Chest x-ray: I independently viewed and interpreted the images; there is no focal consolidation or edema  PROCEDURES:  Critical Care performed: No  .Cardioversion  Date/Time: 09/21/2023 7:02 PM  Performed by: Jacolyn Pae, MD Authorized by: Jacolyn Pae, MD   Consent:    Consent obtained:  Written   Consent given by:  Patient   Risks discussed:  Induced arrhythmia, pain and death   Alternatives discussed:  Rate-control medication and observation Pre-procedure details:    Cardioversion basis:  Elective   Rhythm:  Atrial flutter   Electrode placement:  Anterior-posterior Patient sedated: Yes. Refer to sedation procedure documentation for details of sedation.  Attempt one:    Cardioversion mode:  Synchronous   Waveform:  Biphasic   Shock (Joules):  100   Shock outcome:  Conversion to normal sinus rhythm Post-procedure details:    Patient status:  Alert    Patient tolerance of procedure:  Tolerated well, no immediate complications .Sedation  Date/Time: 09/21/2023 7:03 PM  Performed by: Jacolyn Pae, MD Authorized by: Jacolyn Pae, MD   Consent:    Consent obtained:  Written   Consent given by:  Patient   Risks discussed:  Prolonged hypoxia resulting in organ damage, prolonged sedation necessitating reversal, dysrhythmia, inadequate sedation and respiratory compromise necessitating ventilatory assistance and intubation   Alternatives discussed:  Anxiolysis Universal protocol:    Immediately prior to procedure, a time out was called: yes   Pre-sedation assessment:    Time since last food or drink:  6 hours   ASA classification: class 3 - patient with severe systemic disease     Mallampati score:  I - soft palate, uvula, fauces, pillars visible   Pre-sedation assessments completed and reviewed: airway patency, cardiovascular function, mental status, nausea/vomiting, pain level and respiratory function   A pre-sedation assessment was completed prior to the start of the procedure Procedure details (see MAR for exact dosages):    Preoxygenation:  Nasal cannula   Sedation:  Midazolam    Intended level of sedation: moderate (conscious sedation)   Analgesia:  Fentanyl    Intra-procedure events: none     Total Provider sedation time (minutes):  10 Post-procedure details:   A post-sedation assessment was completed following the completion of the procedure.   Attendance: Constant attendance by certified staff until patient recovered     Recovery: Patient returned to pre-procedure baseline     Patient is stable for discharge or admission: yes     Procedure completion:  Tolerated well, no immediate complications    MEDICATIONS ORDERED IN ED: Medications  fentaNYL  (SUBLIMAZE ) injection 50 mcg (has no administration in time range)  midazolam  (VERSED ) injection 2 mg (has no administration in time range)  midazolam  (VERSED ) injection 2  mg (has no administration in time range)  fentaNYL  (SUBLIMAZE ) injection 50 mcg (has no administration in time range)  midazolam  (VERSED ) injection (2 mg Intravenous Given 09/21/23 1804)  fentaNYL  (SUBLIMAZE ) injection (50 mcg Intravenous Given 09/21/23 1805)     IMPRESSION / MDM / ASSESSMENT AND PLAN / ED COURSE  I reviewed the triage vital signs and the nursing notes.  88 year old male with PMH as noted above presents with generalized weakness, palpitations, and some shortness of breath over the last 3 days and was diagnosed with atrial flutter at the cardiology office and sent into the ED for cardioversion.  On exam currently his heart rate is in the 130s to 140s in atrial flutter.  Other vital signs are normal.  Differential diagnosis includes, but  is not limited to, atrial flutter, atrial fibrillation, other tachydysrhythmia.  Chest x-ray is clear.  BMP and CBC show no acute findings.  Troponin is negative.  There is no indication for serial troponins.  As we are setting up for electrical cardioversion and the pads were placed on the patient, he appears to have converted spontaneously and is now in normal sinus rhythm in the 90s.  We will observe the patient for 1 to 2 hours to determine if he goes back into atrial flutter.  Patient's presentation is most consistent with acute presentation with potential threat to life or bodily function.  The patient is on the cardiac monitor to evaluate for evidence of arrhythmia and/or significant heart rate changes.  ----------------------------------------- 7:04 PM on 09/21/2023 -----------------------------------------  The patient did go back into atrial flutter after about 10 or 15 minutes with a rate of 130s and his symptoms resumed.  We proceeded with the cardioversion which was successful on the first attempt.  His rate is now 75.  The patient tolerated the procedure well.  He was sedated with a low-dose of Versed  and given fentanyl  for pain.   He is now at his preprocedure baseline and his weakness and palpitations have resolved.  He is stable for discharge home.  Return precautions given, and he expressed understanding.   FINAL CLINICAL IMPRESSION(S) / ED DIAGNOSES   Final diagnoses:  Atrial flutter, unspecified type (HCC)     Rx / DC Orders   ED Discharge Orders     None        Note:  This document was prepared using Dragon voice recognition software and may include unintentional dictation errors.    Jacolyn Pae, MD 09/21/23 1905

## 2023-09-24 ENCOUNTER — Ambulatory Visit (INDEPENDENT_AMBULATORY_CARE_PROVIDER_SITE_OTHER): Admitting: Internal Medicine

## 2023-09-24 VITALS — BP 120/72 | HR 88 | Resp 16 | Ht 70.0 in | Wt 221.0 lb

## 2023-09-24 DIAGNOSIS — R739 Hyperglycemia, unspecified: Secondary | ICD-10-CM

## 2023-09-24 DIAGNOSIS — I4891 Unspecified atrial fibrillation: Secondary | ICD-10-CM

## 2023-09-24 DIAGNOSIS — I724 Aneurysm of artery of lower extremity: Secondary | ICD-10-CM

## 2023-09-24 DIAGNOSIS — G4730 Sleep apnea, unspecified: Secondary | ICD-10-CM

## 2023-09-24 DIAGNOSIS — I25119 Atherosclerotic heart disease of native coronary artery with unspecified angina pectoris: Secondary | ICD-10-CM

## 2023-09-24 DIAGNOSIS — R911 Solitary pulmonary nodule: Secondary | ICD-10-CM

## 2023-09-24 DIAGNOSIS — I714 Abdominal aortic aneurysm, without rupture, unspecified: Secondary | ICD-10-CM

## 2023-09-24 DIAGNOSIS — E7800 Pure hypercholesterolemia, unspecified: Secondary | ICD-10-CM | POA: Diagnosis not present

## 2023-09-24 DIAGNOSIS — I10 Essential (primary) hypertension: Secondary | ICD-10-CM

## 2023-09-24 DIAGNOSIS — C61 Malignant neoplasm of prostate: Secondary | ICD-10-CM | POA: Diagnosis not present

## 2023-09-24 DIAGNOSIS — J449 Chronic obstructive pulmonary disease, unspecified: Secondary | ICD-10-CM

## 2023-09-24 DIAGNOSIS — I5032 Chronic diastolic (congestive) heart failure: Secondary | ICD-10-CM

## 2023-09-24 DIAGNOSIS — K219 Gastro-esophageal reflux disease without esophagitis: Secondary | ICD-10-CM

## 2023-09-24 DIAGNOSIS — N1831 Chronic kidney disease, stage 3a: Secondary | ICD-10-CM

## 2023-09-24 DIAGNOSIS — I6523 Occlusion and stenosis of bilateral carotid arteries: Secondary | ICD-10-CM

## 2023-09-24 DIAGNOSIS — D649 Anemia, unspecified: Secondary | ICD-10-CM

## 2023-09-24 MED ORDER — LOVASTATIN 40 MG PO TABS: 80.0000 mg | ORAL_TABLET | Freq: Every day | ORAL | 1 refills | Status: DC

## 2023-09-24 MED ORDER — ISOSORBIDE MONONITRATE ER 30 MG PO TB24: 30.0000 mg | ORAL_TABLET | Freq: Every day | ORAL | 2 refills | Status: DC

## 2023-09-24 NOTE — Progress Notes (Signed)
 Subjective:    Patient ID: Nathaniel Thompson, male    DOB: [birth year], 88 y.o.   MRN: [id number]  Patient here for  Chief Complaint  Patient presents with   Medical Management of Chronic Issues    HPI Here for a scheduled follow up - follow up regarding CAD s/p PCI, paroxysmal afib and hypertension. Was evaluated by cardiology - 09/21/23 - with fatigue, palpitations and sob. Found to be in aflutter - rated 130-140s. Sent to ER. ER -  CXR - clear. Met b and cbc - no acute findings. Troponin negative. S/p cardioversion. Cardioversion was successful at first attempt. Discharged. Reports that since his ER visit, he has felt better. Energy is improving. Breathing better. No chest pain. No bowel change reported. Had iron  infusion - 09/13/23.    Past Medical History:  Diagnosis Date   (HFpEF) heart failure with preserved ejection fraction (HCC)    a. 05/2018 Echo: Nl EF, Gr1 DD, sev dil LA, mild MR/TR, mild PAH; b. 05/2019 Echo: EF 55-60%, no rwma, mild LVH. Nl PASP. Mildly dil LA. Triv MR. Mild AoV sclerosis w/o stenosis. Mildly dil Asc AO (39mm); d. 02/2023 Echo [facility]): EF >55%, nl RV fxn, mild PR.   AAA (abdominal aortic aneurysm) (HCC)    Allergic rhinitis    Atrial fibrillation (HCC)    Barrett's esophagus 10/21/2013   Bone cancer (HCC)    CKD (chronic kidney disease), stage III (HCC)    Colon polyp, hyperplastic    COPD (chronic obstructive pulmonary disease) (HCC)    mild COPD. former smoker   Coronary artery disease    a. 2003 s/p PCI ([facility]); b. 2004 s/p PCI x 2 ([facility]); c. 11/2017 Cath: LM nl, LAD mild dzs, LCX patent stent w/ 30% distal edge restenosis, RCA patent distal stent w/ 30% prox edge stenosis. Nl EF->Med Rx; d. 02/2021 Cath: LM nl, LAD min irregs, D1/2 min irregs, LCX 10p ISR, 52m, RCA 5m/d ISR, EF 65%; e. 07/2023 PET Stress: antlat and inflat ischemia.   Diverticulosis    Fundic gland polyps of stomach, benign    Gastritis    GERD (gastroesophageal reflux disease)     Hypercholesteremia    Hypertension    PAF (paroxysmal atrial fibrillation) (HCC)    a. CHA2DS2VASc = 7-->eliquis .   Prostate cancer (HCC)    Prostatism    Pulmonary embolism (HCC)    a. 2023 - lobar and segmental PE in setting of RSV.   Reflux esophagitis    Renal stones    Skin cancer    left cheek/lesion excised   Sleep apnea    Tachy-brady syndrome (HCC)    a. 03/2021 s/p MDT MRI compatible DC PPM (ser # [id number]).   Tachycardia    a. 11/2017 admit w/ tachycardia/? afib-->no afib noted upon review (amio/OAC d/c'd).   TIA (transient ischemic attack)    a. 02/2023 admit [facility] - no stroke on MRI; b. 06/2022 EEG: nl for age.   Past Surgical History:  Procedure Laterality Date   CARDIAC CATHETERIZATION     COLONOSCOPY WITH PROPOFOL  N/A 09/13/2015   Procedure: COLONOSCOPY WITH PROPOFOL ;  Surgeon: Gladis RAYMOND Mariner, MD;  Location: [facility] ENDOSCOPY;  Service: Endoscopy;  Laterality: N/A;   COLONOSCOPY WITH PROPOFOL  N/A 05/07/2019   Procedure: COLONOSCOPY WITH PROPOFOL ;  Surgeon: Dessa Reyes ORN, MD;  Location: [facility] ENDOSCOPY;  Service: Endoscopy;  Laterality: N/A;   CORONARY ANGIOPLASTY     ESOPHAGOGASTRODUODENOSCOPY (EGD) WITH PROPOFOL  N/A 09/13/2015   Procedure: ESOPHAGOGASTRODUODENOSCOPY (EGD)  WITH PROPOFOL ;  Surgeon: Gladis RAYMOND Mariner, MD;  Location: [facility] ENDOSCOPY;  Service: Endoscopy;  Laterality: N/A;   ESOPHAGOGASTRODUODENOSCOPY (EGD) WITH PROPOFOL  N/A 12/28/2015   Procedure: ESOPHAGOGASTRODUODENOSCOPY (EGD) WITH PROPOFOL ;  Surgeon: Gladis RAYMOND Mariner, MD;  Location: [facility] ENDOSCOPY;  Service: Endoscopy;  Laterality: N/A;   ESOPHAGOGASTRODUODENOSCOPY (EGD) WITH PROPOFOL  N/A 06/16/2016   Procedure: ESOPHAGOGASTRODUODENOSCOPY (EGD) WITH PROPOFOL ;  Surgeon: Gladis RAYMOND Mariner, MD;  Location: [facility] ENDOSCOPY;  Service: Endoscopy;  Laterality: N/A;   ESOPHAGOGASTRODUODENOSCOPY (EGD) WITH PROPOFOL  N/A 05/07/2019   Procedure: ESOPHAGOGASTRODUODENOSCOPY (EGD) WITH PROPOFOL ;  Surgeon: Dessa Reyes ORN, MD;  Location: [facility] ENDOSCOPY;  Service: Endoscopy;  Laterality: N/A;   EYE SURGERY  2013   CATARACT EXTRACTION   GANGLION CYST EXCISION Right 05/18/2017   Procedure: REMOVAL GANGLION CYST ANKLE;  Surgeon: Ashley Soulier, DPM;  Location: [facility] ORS;  Service: Podiatry;  Laterality: Right;   heart cath stent     LEFT HEART CATH AND CORONARY ANGIOGRAPHY Right 12/03/2017   Procedure: Left Heart Cath and Coronary Angiography with possible coronary intervention;  Surgeon: Fernand Denyse LABOR, MD;  Location: [facility] INVASIVE CV LAB;  Service: Cardiovascular;  Laterality: Right;   LEFT HEART CATH AND CORONARY ANGIOGRAPHY Left 07/27/2023   Procedure: LEFT HEART CATH AND CORONARY ANGIOGRAPHY;  Surgeon: Darron Deatrice LABOR, MD;  Location: [facility] INVASIVE CV LAB;  Service: Cardiovascular;  Laterality: Left;   PACEMAKER IMPLANT N/A 04/04/2021   Procedure: PACEMAKER IMPLANT;  Surgeon: Fernande Elspeth BROCKS, MD;  Location: [facility] INVASIVE CV LAB;  Service: Cardiovascular;  Laterality: N/A;   PROSTATE BIOPSY     RIGHT/LEFT HEART CATH AND CORONARY ANGIOGRAPHY N/A 02/07/2021   Procedure: RIGHT/LEFT HEART CATH AND CORONARY ANGIOGRAPHY;  Surgeon: Darron Deatrice LABOR, MD;  Location: [facility] INVASIVE CV LAB;  Service: Cardiovascular;  Laterality: N/A;   stents     multiple   Family History  Problem Relation Age of Onset   Cancer Mother        gastric and lung   Cancer Father        multiple myeloma   Stroke Father    Cancer Sister        leukemia   Cancer Brother        leukemia   Cancer Brother        kidney   Cancer Daughter        Uterine   Cancer Other        Nephes (Sister's Son): Prostate   Social History   Socioeconomic History   Marital status: Married    Spouse name: Glenda   Number of children: 2   Years of education: Not on file   Highest education level: Not on file  Occupational History   Occupation: retired Production designer, theatre/television/film for [employer]  Tobacco Use   Smoking status: Former    Current packs/day:  0.00    Average packs/day: 1 pack/day for 35.0 years (35.0 ttl pk-yrs)    Types: Cigarettes    Start date: 03/07/1951    Quit date: 03/06/1986    Years since quitting: 37.5   Smokeless tobacco: Former    Types: Chew    Quit date: 1985   Tobacco comments:    Started smoking around 88 yrs old.    Smoked 1 PPD at his heaviest.  Vaping Use   Vaping status: Never Used  Substance and Sexual Activity   Alcohol use: No   Drug use: No   Sexual activity: Not Currently  Other Topics Concern   Not  on file  Social History Narrative   Married, Gaetana 2 step children and 2 children from previous marriage   Social Drivers of Corporate investment banker Strain: Low Risk  (10/19/2022)   Overall Financial Resource Strain (CARDIA)    Difficulty of Paying Living Expenses: Not hard at all  Food Insecurity: No Food Insecurity (10/19/2022)   Hunger Vital Sign    Worried About Running Out of Food in the Last Year: Never true    Ran Out of Food in the Last Year: Never true  Transportation Needs: No Transportation Needs (10/19/2022)   PRAPARE - Administrator, Civil Service (Medical): No    Lack of Transportation (Non-Medical): No  Physical Activity: Sufficiently Active (10/19/2022)   Exercise Vital Sign    Days of Exercise per Week: 5 days    Minutes of Exercise per Session: 30 min  Stress: No Stress Concern Present (10/19/2022)   [employer] of Occupational Health - Occupational Stress Questionnaire    Feeling of Stress : Not at all  Social Connections: Moderately Integrated (10/19/2022)   Social Connection and Isolation Panel    Frequency of Communication with Friends and Family: More than three times a week    Frequency of Social Gatherings with Friends and Family: Three times a week    Attends Religious Services: More than 4 times per year    Active Member of Clubs or Organizations: No    Attends Banker Meetings: Never    Marital Status: Married     Review of  Systems  Constitutional:  Positive for fatigue. Negative for appetite change and unexpected weight change.  HENT:  Negative for congestion and sinus pressure.   Respiratory:  Negative for cough and chest tightness.        Breathing better.   Cardiovascular:  Negative for chest pain and leg swelling.  Gastrointestinal:  Negative for abdominal pain, nausea and vomiting.  Genitourinary:  Negative for difficulty urinating and dysuria.  Musculoskeletal:  Negative for joint swelling and myalgias.  Skin:  Negative for color change and rash.  Neurological:        Recent light headedness as outlined. Has improved. Discussed slow position changes and movements.   Psychiatric/Behavioral:  Negative for agitation and dysphoric mood.        Objective:     BP 120/72   Pulse 88   Resp 16   Ht 5' 10 (1.778 m)   Wt 221 lb (100.2 kg)   SpO2 98%   BMI 31.71 kg/m  Wt Readings from Last 3 Encounters:  09/24/23 221 lb (100.2 kg)  09/21/23 221 lb 3.2 oz (100.3 kg)  08/14/23 222 lb 6 oz (100.9 kg)    Physical Exam Vitals reviewed.  Constitutional:      General: He is not in acute distress.    Appearance: Normal appearance. He is well-developed.  HENT:     Head: Normocephalic and atraumatic.     Right Ear: External ear normal.     Left Ear: External ear normal.     Mouth/Throat:     Pharynx: No oropharyngeal exudate or posterior oropharyngeal erythema.  Eyes:     General: No scleral icterus.       Right eye: No discharge.        Left eye: No discharge.     Conjunctiva/sclera: Conjunctivae normal.  Cardiovascular:     Rate and Rhythm: Normal rate and regular rhythm.  Pulmonary:     Effort: Pulmonary  effort is normal. No respiratory distress.     Breath sounds: Normal breath sounds.  Abdominal:     General: Bowel sounds are normal.     Palpations: Abdomen is soft.     Tenderness: There is no abdominal tenderness.  Musculoskeletal:        General: No swelling or tenderness.      Cervical back: Neck supple. No tenderness.  Lymphadenopathy:     Cervical: No cervical adenopathy.  Skin:    Findings: No erythema or rash.  Neurological:     Mental Status: He is alert.  Psychiatric:        Mood and Affect: Mood normal.        Behavior: Behavior normal.         Outpatient Encounter Medications as of 09/24/2023  Medication Sig   apixaban  (ELIQUIS ) 5 MG TABS tablet Take 1 tablet (5 mg total) by mouth 2 (two) times daily.   Calcium  Carb-Cholecalciferol  (CALCIUM  + D3 PO) Take 1 tablet by mouth daily.   cholecalciferol  (VITAMIN D3) 25 MCG (1000 UNIT) tablet Take 1,000 Units by mouth daily.   cyanocobalamin  (VITAMIN B12) 1000 MCG tablet Take 1,000 mcg by mouth daily.   Fe Bisgly-Vit C-Vit B12-FA (GENTLE IRON  PO) Take 28 mg by mouth daily.   furosemide  (LASIX ) 40 MG tablet TAKE 1 TABLET BY MOUTH EVERY OTHER DAY AS DIRECTED   isosorbide  mononitrate (IMDUR ) 30 MG 24 hr tablet Take 1 tablet (30 mg total) by mouth daily.   Lacosamide (VIMPAT) 100 MG TABS Take 100 mg by mouth in the morning and at bedtime.   losartan  (COZAAR ) 25 MG tablet Take 1 tablet (25 mg total) by mouth daily.   lovastatin  (MEVACOR ) 40 MG tablet Take 2 tablets (80 mg total) by mouth at bedtime.   metoprolol  succinate (TOPROL -XL) 25 MG 24 hr tablet TAKE 1 TABLET BY MOUTH ONCE DAILY   Multiple Vitamin (MULTI-VITAMINS) TABS Take 1 tablet by mouth daily.    pantoprazole  (PROTONIX ) 40 MG tablet Take 40 mg by mouth 2 (two) times daily.    potassium chloride  (KLOR-CON ) 10 MEQ tablet TAKE 1 TABLET BY MOUTH EVERY OTHER DAY WITH FUROSEMID   [DISCONTINUED] isosorbide  mononitrate (IMDUR ) 30 MG 24 hr tablet Take 1 tablet (30 mg total) by mouth daily.   [DISCONTINUED] lovastatin  (MEVACOR ) 40 MG tablet Take 2 tablets (80 mg total) by mouth at bedtime.   No facility-administered encounter medications on file as of 09/24/2023.     Lab Results  Component Value Date   WBC 5.4 09/21/2023   HGB 12.6 (L) 09/21/2023   HCT  37.1 (L) 09/21/2023   PLT 203 09/21/2023   GLUCOSE 132 (H) 09/21/2023   CHOL 121 09/14/2023   TRIG 108.0 09/14/2023   HDL 31.60 (L) 09/14/2023   LDLCALC 67 09/14/2023   ALT 15 09/14/2023   AST 20 09/14/2023   NA 141 09/21/2023   K 4.0 09/21/2023   CL 111 09/21/2023   CREATININE 1.39 (H) 09/21/2023   BUN 21 09/21/2023   CO2 19 (L) 09/21/2023   TSH 2.19 05/16/2023   PSA 5.47 (H) 08/03/2016   INR 1.2 02/16/2023   HGBA1C 5.7 09/14/2023    DG Chest Port 1 View Result Date: 09/21/2023 CLINICAL DATA:  10009 Palpitations 10009 EXAM: PORTABLE CHEST 1 VIEW COMPARISON:  Chest x-ray 03/15/2023 FINDINGS: Left chest wall 2 lead pacemaker. The heart and mediastinal contours are unchanged. Atherosclerotic plaque. Redemonstration of right upper lobe 9 mm calcified nodule. No focal consolidation. No  pulmonary edema. No pleural effusion. No pneumothorax. No acute osseous abnormality. IMPRESSION: No active disease. Electronically Signed   By: Morgane  Naveau M.D.   On: 09/21/2023 16:56       Assessment & Plan:  Sleep apnea, unspecified type Assessment & Plan: Continue cpap.    Hypercholesterolemia Assessment & Plan: Continue lovastatin .  Follow lipid panel and liver function tests. Follow lipid panel.   Orders: -     Lipid panel; Future -     Hepatic function panel; Future  Hyperglycemia Assessment & Plan: Follow met b and A1c.   Orders: -     Hemoglobin A1c; Future  Hypertension, essential Assessment & Plan: Blood pressure as outlined.  On imdur  and losartan . No changes in medication today.   Orders: -     Basic metabolic panel with GFR; Future  Prostate cancer [facility]) Assessment & Plan: Seeing oncology.  Has been on eligard . Continue active surveillance.    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 Stable.    Bilateral carotid artery stenosis Assessment & Plan: Evaluated 01/2023 - Duplex ultrasound shows 1-39% ICA stenosis bilaterally.  Continue antiplatelet therapy as prescribed Continue management of CAD, HTN and Hyperlipidemia. Follow up in 24-36 months with duplex ultrasound and physical exam   Atrial fibrillation, unspecified type [facility]) Assessment & Plan: Appears to be in SR today.  Follow. Continue eliquis . Recent ER visit - aflutter. S/p cardioversion. Feeling better. Discussed f/u with EP. Planning to f/u.    Abdominal aortic aneurysm (AAA) without rupture, unspecified part [facility]) Assessment & Plan: Saw [facility]) 01/2022- recommended f/u aortic duplex in 12 months. AAA US  01/22/23: unchanged from prior measuring 2.8 cm in diameter. Will continue to monitor for symptoms, otherwise recommend continued outpatient surveillance. Per note, f/u aortic ultrasound in 12 months.    Anemia, unspecified type Assessment & Plan: Followed by hematology for IDA.  On oral iron .  Reports iron  infusion - 09/13/23.    Chronic heart failure with preserved ejection fraction [facility]) Assessment & Plan: Followed by cardiology.  ECHO - 05/2019 - EF 55-60%.  Continues on losartan , imdur .  TTE negative for LV thrombus, LVEF >55%. No evidence of volume overload on exam.  Stable. No changes in medication.    Stage 3a chronic kidney disease (HCC) Assessment & Plan: Continue to avoid antiinflammatories. Follow metabolic panel.    Chronic obstructive pulmonary disease, unspecified COPD type (HCC) Assessment & Plan: Breathing stable.    Coronary artery disease involving native coronary artery of native heart with angina pectoris [facility]) Assessment & Plan: Sees cardiology. Continue isosorbide , losartan , lovastatin  and metoprolol . Stable. No chest pain. No changes in medication. Follow pressures.    GERD without esophagitis Assessment & Plan: No upper symptoms reported. Continue PPI.    Lung nodule Assessment & Plan: Evaluated by Dr Tamea. No further w/up felt warranted.    Other orders -     Isosorbide  Mononitrate ER; Take 1  tablet (30 mg total) by mouth daily.  Dispense: 90 tablet; Refill: 2 -     Lovastatin ; Take 2 tablets (80 mg total) by mouth at bedtime.  Dispense: 180 tablet; Refill: 1     Allena Hamilton, MD

## 2023-09-30 ENCOUNTER — Encounter: Payer: Self-pay | Admitting: Internal Medicine

## 2023-09-30 NOTE — Assessment & Plan Note (Signed)
 Evaluated 01/2023 - Duplex ultrasound shows 1-39% ICA stenosis bilaterally. Continue antiplatelet therapy as prescribed Continue management of CAD, HTN and Hyperlipidemia. Follow up in 24-36 months with duplex ultrasound and physical exam

## 2023-09-30 NOTE — Assessment & Plan Note (Signed)
 Sees cardiology. Continue isosorbide , losartan , lovastatin  and metoprolol . Stable. No chest pain. No changes in medication. Follow pressures.

## 2023-09-30 NOTE — Assessment & Plan Note (Signed)
 Blood pressure as outlined.  On imdur  and losartan . No changes in medication today.

## 2023-09-30 NOTE — Assessment & Plan Note (Signed)
 Evaluated by Dr Jayme Cloud. No further w/up felt warranted.

## 2023-09-30 NOTE — Assessment & Plan Note (Signed)
 Follow met b and A1c.

## 2023-09-30 NOTE — Assessment & Plan Note (Signed)
Continue to avoid antiinflammatories.  Follow metabolic panel.  

## 2023-09-30 NOTE — Assessment & Plan Note (Signed)
No upper symptoms reported.  Continue PPI.  ?

## 2023-09-30 NOTE — Assessment & Plan Note (Signed)
 Had f/u with [facility] 01/2023. Stable.

## 2023-09-30 NOTE — Assessment & Plan Note (Signed)
 Breathing stable.

## 2023-09-30 NOTE — Assessment & Plan Note (Signed)
 Continue lovastatin .  Follow lipid panel and liver function tests. Follow lipid panel.

## 2023-09-30 NOTE — Assessment & Plan Note (Signed)
 Appears to be in SR today.  Follow. Continue eliquis . Recent ER visit - aflutter. S/p cardioversion. Feeling better. Discussed f/u with EP. Planning to f/u.

## 2023-09-30 NOTE — Assessment & Plan Note (Signed)
 Followed by cardiology.  ECHO - 05/2019 - EF 55-60%.  Continues on losartan , imdur .  TTE negative for LV thrombus, LVEF >55%. No evidence of volume overload on exam.  Stable. No changes in medication.

## 2023-09-30 NOTE — Assessment & Plan Note (Signed)
 Seeing oncology.  Has been on eligard. Continue active surveillance.

## 2023-09-30 NOTE — Assessment & Plan Note (Signed)
 Followed by hematology for IDA.  On oral iron .  Reports iron  infusion - 09/13/23.

## 2023-09-30 NOTE — Assessment & Plan Note (Signed)
 Continue cpap.

## 2023-09-30 NOTE — Assessment & Plan Note (Signed)
 Saw [name]) 01/2022 - recommended f/u aortic duplex in 12 months. AAA [place] 01/22/23: unchanged from prior measuring 2.8 cm in diameter. Will continue to monitor for symptoms, otherwise recommend continued outpatient surveillance. Per note, f/u aortic ultrasound in 12 months.

## 2023-10-01 NOTE — Progress Notes (Unsigned)
 Cardiology Office Note    Date:  10/02/2023   ID:  Nathaniel Thompson, DOB [birth year], MRN [id number]  PCP:  Glendia Shad, MD  Cardiologist:  Deatrice Cage, MD  Electrophysiologist:  None   Chief Complaint: ED follow up  History of Present Illness:   Nathaniel Thompson is a 88 y.o. male with history of CAD status post multiple PCI as outlined below, symptomatic bradycardia status post PPM in 03/2021, PAF/flutter on apixaban , HFpEF, TIA, metastatic prostate cancer, AAA followed by vascular surgery, CKD stage III, provoked PE in 2023, HTN, and HLD who presents for ED follow-up of atrial flutter with RVR.  He has a known history of CAD status post stenting to the LCx and RCA.  He had PCI done in 2003 at [facility].  He had repeat PCI x 2 at [facility] in 2004.  He was admitted to the hospital in 11/2017 with questionable tachycardia and A-fib.  Initially, he was placed on amiodarone  and anticoagulation, but all his EKGs revealed sinus rhythm at that time and both were discontinued.  Echo in 05/2018 showed normal LV systolic function and grade 1 diastolic dysfunction with severely dilated left atrium, mild mitral and tricuspid regurgitation, and mild pulmonary hypertension.  In 02/2021, he had worsening shortness of breath and chest pain.  LHC showed patent stents in the LCx and RCA with no obstructive CAD.  RHC showed mildly elevated filling pressures, mild pulmonary hypertension, and normal cardiac output.  During the procedure he was noted to have intermittent bradycardia with heart rate going into the low 40s bpm.  Subsequent outpatient monitor showed frequent episodes of SVT.  He was referred to EP and underwent pacemaker implantation in 03/2021.  In late 2023, he fell from a deer stand and fractured his left posterior calcaneus.  He was subsequently admitted to [facility] [place] for shortness of breath, cough, and fever in the setting of displaced fracture of the left posterior calcaneus.  He was found to have RSV and  lobar and segmental pulmonary embolism.  He was also noted to have episodes of tachycardia and found to be in A-fib with RVR that lasted for about 45 minutes.  He was placed on anticoagulation and metoprolol .  He was admitted to [facility] in 02/2019 for with slurred speech, right arm weakness, and suspected stroke.  tPA was not given (on Eliquis ).  He was hypertensive on presentation.  MRI of the brain showed no acute stroke.  Echo showed normal LV systolic function with no significant valvular abnormalities.  Episode was thought to be a TIA.  More recently, he was seen in 07/2023 with increased shortness of breath and decreased exercise tolerance.  Myocardial PET showed anterolateral and inferior lateral ischemia.  Subsequent LHC in 07/2023 showed patent RCA and LCx stents with minimal restenosis.  There was mild nonobstructive disease overall.  Echo showed normal LV systolic function with grade 1 diastolic dysfunction and mild mitral regurgitation.  He was most recently seen in the office on 09/21/2023 reporting a 3 to 4-day history of fatigue and tachycardic rates.  He was found to be in atrial flutter with 2-1 AV block.  He had been adherent to pharmacotherapy including anticoagulation.  ATP was attempted in the clinic without success and he was transferred to the ED.  Workup in the ER showed nonacute chest x-ray and a normal troponin.  While preparing for DCCV, he spontaneously converted to sinus rhythm, though redeveloped atrial flutter after about 10 to 15 minutes with rates in  the 130s bpm leading to successful DCCV.  He comes in accompanied by his wife today and is overall doing reasonably well.  He is without symptoms of angina or cardiac decompensation.  He does note a several month history of intermittent shortness of breath that typically occurs with exertion and is unchanged over the past several months.  Indicates he will walk on a regular basis with some resting in between.  He notes some mild chronic  bilateral lower extremity swelling that he has attributed to orthopedic issues.  He does feel like his abdomen is a little more swollen.  No progressive orthopnea.  He does add salt to some foods.  Currently taking furosemide  40 mg every other day.   Labs independently reviewed: 09/2023 - Hgb 12.6, PLT 203, potassium 4.0, BUN 21, serum creatinine 1.39, TC 121, TG 108, HDL 31, LDL 67, A1c 5.7, albumin 4.0, AST/ALT normal 05/2023 - TSH normal  Past Medical History:  Diagnosis Date   (HFpEF) heart failure with preserved ejection fraction (HCC)    a. 05/2018 Echo: Nl EF, Gr1 DD, sev dil LA, mild MR/TR, mild PAH; b. 05/2019 Echo: EF 55-60%, no rwma, mild LVH. Nl PASP. Mildly dil LA. Triv MR. Mild AoV sclerosis w/o stenosis. Mildly dil Asc AO (39mm); d. 02/2023 Echo [facility]): EF >55%, nl RV fxn, mild PR.   AAA (abdominal aortic aneurysm) (HCC)    Allergic rhinitis    Atrial fibrillation (HCC)    Barrett's esophagus 10/21/2013   Bone cancer (HCC)    CKD (chronic kidney disease), stage III (HCC)    Colon polyp, hyperplastic    COPD (chronic obstructive pulmonary disease) (HCC)    mild COPD. former smoker   Coronary artery disease    a. 2003 s/p PCI ([facility]); b. 2004 s/p PCI x 2 ([facility]); c. 11/2017 Cath: LM nl, LAD mild dzs, LCX patent stent w/ 30% distal edge restenosis, RCA patent distal stent w/ 30% prox edge stenosis. Nl EF->Med Rx; d. 02/2021 Cath: LM nl, LAD min irregs, D1/2 min irregs, LCX 10p ISR, 59m, RCA 42m/d ISR, EF 65%; e. 07/2023 PET Stress: antlat and inflat ischemia.   Diverticulosis    Fundic gland polyps of stomach, benign    Gastritis    GERD (gastroesophageal reflux disease)    Hypercholesteremia    Hypertension    PAF (paroxysmal atrial fibrillation) (HCC)    a. CHA2DS2VASc = 7-->eliquis .   Prostate cancer (HCC)    Prostatism    Pulmonary embolism (HCC)    a. 2023 - lobar and segmental PE in setting of RSV.   Reflux esophagitis    Renal stones    Skin cancer    left cheek/lesion  excised   Sleep apnea    Tachy-brady syndrome (HCC)    a. 03/2021 s/p MDT MRI compatible DC PPM (ser # [id number]).   Tachycardia    a. 11/2017 admit w/ tachycardia/? afib-->no afib noted upon review (amio/OAC d/c'd).   TIA (transient ischemic attack)    a. 02/2023 admit [facility] - no stroke on MRI; b. 06/2022 EEG: nl for age.    Past Surgical History:  Procedure Laterality Date   CARDIAC CATHETERIZATION     COLONOSCOPY WITH PROPOFOL  N/A 09/13/2015   Procedure: COLONOSCOPY WITH PROPOFOL ;  Surgeon: Gladis RAYMOND Mariner, MD;  Location: [facility] ENDOSCOPY;  Service: Endoscopy;  Laterality: N/A;   COLONOSCOPY WITH PROPOFOL  N/A 05/07/2019   Procedure: COLONOSCOPY WITH PROPOFOL ;  Surgeon: Dessa Reyes ORN, MD;  Location: [facility] ENDOSCOPY;  Service: Endoscopy;  Laterality: N/A;   CORONARY ANGIOPLASTY     ESOPHAGOGASTRODUODENOSCOPY (EGD) WITH PROPOFOL  N/A 09/13/2015   Procedure: ESOPHAGOGASTRODUODENOSCOPY (EGD) WITH PROPOFOL ;  Surgeon: Gladis RAYMOND Mariner, MD;  Location: [facility] ENDOSCOPY;  Service: Endoscopy;  Laterality: N/A;   ESOPHAGOGASTRODUODENOSCOPY (EGD) WITH PROPOFOL  N/A 12/28/2015   Procedure: ESOPHAGOGASTRODUODENOSCOPY (EGD) WITH PROPOFOL ;  Surgeon: Gladis RAYMOND Mariner, MD;  Location: [facility] ENDOSCOPY;  Service: Endoscopy;  Laterality: N/A;   ESOPHAGOGASTRODUODENOSCOPY (EGD) WITH PROPOFOL  N/A 06/16/2016   Procedure: ESOPHAGOGASTRODUODENOSCOPY (EGD) WITH PROPOFOL ;  Surgeon: Gladis RAYMOND Mariner, MD;  Location: [facility] ENDOSCOPY;  Service: Endoscopy;  Laterality: N/A;   ESOPHAGOGASTRODUODENOSCOPY (EGD) WITH PROPOFOL  N/A 05/07/2019   Procedure: ESOPHAGOGASTRODUODENOSCOPY (EGD) WITH PROPOFOL ;  Surgeon: Dessa Reyes ORN, MD;  Location: [facility] ENDOSCOPY;  Service: Endoscopy;  Laterality: N/A;   EYE SURGERY  2013   CATARACT EXTRACTION   GANGLION CYST EXCISION Right 05/18/2017   Procedure: REMOVAL GANGLION CYST ANKLE;  Surgeon: Ashley Soulier, DPM;  Location: [facility] ORS;  Service: Podiatry;  Laterality: Right;   heart cath stent      LEFT HEART CATH AND CORONARY ANGIOGRAPHY Right 12/03/2017   Procedure: Left Heart Cath and Coronary Angiography with possible coronary intervention;  Surgeon: Fernand Denyse LABOR, MD;  Location: [facility] INVASIVE CV LAB;  Service: Cardiovascular;  Laterality: Right;   LEFT HEART CATH AND CORONARY ANGIOGRAPHY Left 07/27/2023   Procedure: LEFT HEART CATH AND CORONARY ANGIOGRAPHY;  Surgeon: Darron Deatrice LABOR, MD;  Location: [facility] INVASIVE CV LAB;  Service: Cardiovascular;  Laterality: Left;   PACEMAKER IMPLANT N/A 04/04/2021   Procedure: PACEMAKER IMPLANT;  Surgeon: Fernande Elspeth BROCKS, MD;  Location: [facility] INVASIVE CV LAB;  Service: Cardiovascular;  Laterality: N/A;   PROSTATE BIOPSY     RIGHT/LEFT HEART CATH AND CORONARY ANGIOGRAPHY N/A 02/07/2021   Procedure: RIGHT/LEFT HEART CATH AND CORONARY ANGIOGRAPHY;  Surgeon: Darron Deatrice LABOR, MD;  Location: [facility] INVASIVE CV LAB;  Service: Cardiovascular;  Laterality: N/A;   stents     multiple    Current Medications: Current Meds  Medication Sig   apixaban  (ELIQUIS ) 5 MG TABS tablet Take 1 tablet (5 mg total) by mouth 2 (two) times daily.   Calcium  Carb-Cholecalciferol  (CALCIUM  + D3 PO) Take 1 tablet by mouth daily.   cholecalciferol  (VITAMIN D3) 25 MCG (1000 UNIT) tablet Take 1,000 Units by mouth daily.   cyanocobalamin  (VITAMIN B12) 1000 MCG tablet Take 1,000 mcg by mouth daily.   Fe Bisgly-Vit C-Vit B12-FA (GENTLE IRON  PO) Take 28 mg by mouth daily.   isosorbide  mononitrate (IMDUR ) 30 MG 24 hr tablet Take 1 tablet (30 mg total) by mouth daily.   Lacosamide (VIMPAT) 100 MG TABS Take 100 mg by mouth in the morning and at bedtime.   losartan  (COZAAR ) 25 MG tablet Take 1 tablet (25 mg total) by mouth daily.   lovastatin  (MEVACOR ) 40 MG tablet Take 2 tablets (80 mg total) by mouth at bedtime.   Multiple Vitamin (MULTI-VITAMINS) TABS Take 1 tablet by mouth daily.    pantoprazole  (PROTONIX ) 40 MG tablet Take 40 mg by mouth 2 (two) times daily.    [DISCONTINUED]  furosemide  (LASIX ) 40 MG tablet TAKE 1 TABLET BY MOUTH EVERY OTHER DAY AS DIRECTED   [DISCONTINUED] metoprolol  succinate (TOPROL -XL) 25 MG 24 hr tablet TAKE 1 TABLET BY MOUTH ONCE DAILY   [DISCONTINUED] potassium chloride  (KLOR-CON ) 10 MEQ tablet TAKE 1 TABLET BY MOUTH EVERY OTHER DAY WITH FUROSEMID    Allergies:   Atorvastatin   Social History   Socioeconomic History   Marital  status: Married    Spouse name: Glenda   Number of children: 2   Years of education: Not on file   Highest education level: Not on file  Occupational History   Occupation: retired Production designer, theatre/television/film for [employer]  Tobacco Use   Smoking status: Former    Current packs/day: 0.00    Average packs/day: 1 pack/day for 35.0 years (35.0 ttl pk-yrs)    Types: Cigarettes    Start date: 03/07/1951    Quit date: 03/06/1986    Years since quitting: 37.6   Smokeless tobacco: Former    Types: Chew    Quit date: 1985   Tobacco comments:    Started smoking around 88 yrs old.    Smoked 1 PPD at his heaviest.  Vaping Use   Vaping status: Never Used  Substance and Sexual Activity   Alcohol use: No   Drug use: No   Sexual activity: Not Currently  Other Topics Concern   Not on file  Social History Narrative   Married, Gaetana 2 step children and 2 children from previous marriage   Social Drivers of Corporate investment banker Strain: Low Risk  (10/19/2022)   Overall Financial Resource Strain (CARDIA)    Difficulty of Paying Living Expenses: Not hard at all  Food Insecurity: No Food Insecurity (10/19/2022)   Hunger Vital Sign    Worried About Running Out of Food in the Last Year: Never true    Ran Out of Food in the Last Year: Never true  Transportation Needs: No Transportation Needs (10/19/2022)   PRAPARE - Administrator, Civil Service (Medical): No    Lack of Transportation (Non-Medical): No  Physical Activity: Sufficiently Active (10/19/2022)   Exercise Vital Sign    Days of Exercise per Week: 5 days     Minutes of Exercise per Session: 30 min  Stress: No Stress Concern Present (10/19/2022)   [employer] of Occupational Health - Occupational Stress Questionnaire    Feeling of Stress : Not at all  Social Connections: Moderately Integrated (10/19/2022)   Social Connection and Isolation Panel    Frequency of Communication with Friends and Family: More than three times a week    Frequency of Social Gatherings with Friends and Family: Three times a week    Attends Religious Services: More than 4 times per year    Active Member of Clubs or Organizations: No    Attends Banker Meetings: Never    Marital Status: Married     Family History:  The patient's family history includes Cancer in his brother, brother, daughter, father, mother, sister, and another family member; Stroke in his father.  ROS:   12-point review of systems is negative unless otherwise noted in the HPI.   EKGs/Labs/Other Studies Reviewed:    Studies reviewed were summarized above. The additional studies were reviewed today:  2D echo 08/10/2023: 1. Left ventricular ejection fraction, by estimation, is 55 to 60%. The  left ventricle has normal function. The left ventricle has no regional  wall motion abnormalities. Left ventricular diastolic parameters are  consistent with Grade I diastolic  dysfunction (impaired relaxation).   2. Right ventricular systolic function is normal. The right ventricular  size is normal. Tricuspid regurgitation signal is inadequate for assessing  PA pressure.   3. Right atrial size was mildly dilated.   4. The mitral valve is degenerative. Mild mitral valve regurgitation. No  evidence of mitral stenosis.   5. The aortic valve was  not well visualized. Aortic valve regurgitation  is not visualized. No aortic stenosis is present.  __________  [facility] 07/27/2023:   Prox Cx to Mid Cx lesion is 10% stenosed.   Mid Cx lesion is 30% stenosed.   Mid RCA to Dist RCA lesion is 5%  stenosed.   Prox RCA lesion is 20% stenosed.   Ost Cx to Prox Cx lesion is 30% stenosed.   1.  Patent RCA and left circumflex stents with minimal restenosis.  Mild nonobstructive disease overall. 2.  I could not cross the aortic valve due to significant tortuosity of the innominate artery.   Recommendations: Continue medical therapy. Obtain an echocardiogram to evaluate systolic and diastolic function. Avoid catheterization via the right radial artery in the future due to significant tortuosity and calcification of the innominate artery. __________  Myocardial PET/CT 07/12/2023:   LV perfusion is abnormal. There is evidence of ischemia. There is no evidence of infarction. Defect 1: There is a medium defect with mild reduction in uptake present in the mid to basal anterolateral and inferolateral location(s) that is reversible. There is normal wall motion in the defect area. Consistent with ischemia.   End diastolic cavity size is normal. End systolic cavity size is normal.   Myocardial blood flow was computed to be 0.33ml/g/min at rest and 1.28ml/g/min at stress. Global myocardial blood flow reserve was 1.68 and was abnormal.   Coronary calcium  assessment not performed due to prior revascularization. Prior stents   Findings are consistent with ischemia. The study is intermediate risk.   Ischemia noted in the mid-basal inferiorlateral/anterolateral walls MBFR abnormal in this area 1.6 __________  Zio patch 02/07/2021: Patient had a min HR of 42 bpm, max HR of 203 bpm, and avg HR of 58 bpm.  Predominant underlying rhythm was Sinus Rhythm.Slight P wave morphology changes were noted.1 run of Ventricular Tachycardia occurred lasting 6 beats with a max rate of 152 bpm (avg 138 bpm).  103 Supraventricular Tachycardia runs occurred, the run with the fastest interval lasting 6 beats with a max rate of 203 bpm, the longest lasting 16.7 secs with an avg rate of 93 bpm. Atrial Fibrillation occurred (<1%  burden), ranging from  103-167 bpm (avg of 133 bpm), the longest lasting 5 mins 51 secs with an avg rate of 139 bpm. Junctional Rhythm was present. Atrial Fibrillation and Junctional Rhythm were detected within +/- 45 seconds of symptomatic patient event(s).  Rare PACs and occasional PVCs. __________  [facility] 02/07/2021:   Prox Cx to Mid Cx lesion is 10% stenosed.   Mid Cx lesion is 30% stenosed.   Mid RCA to Dist RCA lesion is 5% stenosed.   The left ventricular systolic function is normal.   LV end diastolic pressure is mildly elevated.   The left ventricular ejection fraction is 55-65% by visual estimate.   1.  Patent stents in the left circumflex and right coronary artery with no evidence of obstructive coronary artery disease. 2.  Right heart catheterization showed mildly elevated filling pressures, mild pulmonary hypertension and normal cardiac output. 3.  Normal left ventricular systolic function.   Recommendations: No culprit is identified for the patient's chest pain.  Suspect possible GI etiology.  The patient is mildly volume overloaded likely due to some degree of diastolic heart failure.  A small dose diuretic can be considered.   The patient was noted to have intermittent bradycardia during cardiac cath with a heart rate down to the low 40s.  He reports recent episodes  of dizziness and presyncope and thus we will obtain a 2-week outpatient monitor to evaluate the need for a pacemaker. __________  2D echo 05/26/2019: 1. Left ventricular ejection fraction, by estimation, is 55 to 60%. The  left ventricle has normal function. The left ventricle has no regional  wall motion abnormalities. There is mild left ventricular hypertrophy.  Left ventricular diastolic parameters  were normal.   2. Right ventricular systolic function is normal. The right ventricular  size is normal. There is normal pulmonary artery systolic pressure.   3. Left atrial size was mildly dilated.   4. The  mitral valve is normal in structure. Trivial mitral valve  regurgitation. No evidence of mitral stenosis.   5. The aortic valve is normal in structure. Aortic valve regurgitation is  not visualized. Mild aortic valve sclerosis is present, with no evidence  of aortic valve stenosis.   6. Aortic dilatation noted. There is mild dilatation of the ascending  aorta measuring 39 mm.   7. The inferior vena cava is normal in size with greater than 50%  respiratory variability, suggesting right atrial pressure of 3 mmHg.   Comparison(s): Prior Echo showed LV EF 55%, no RWMA, grade I diastolic  dysfunction, and mild LAE. Mild MR/TR.  __________  LHC 12/03/2017 [place]): Ost 1st Mrg to 1st Mrg lesion is 40% stenosed. Prox LAD lesion is 30% stenosed. Prox RCA to Mid RCA lesion is 30% stenosed. Dist RCA lesion is 20% stenosed. Ost Cx to Prox Cx lesion is 30% stenosed.  No significant CAD with normal LVEF, but Heart rate during cath was 48, and will decrease amiodrone to 200 daily after holding for few days and get holter monitor. __________  2D echo 11/27/2017: - Left ventricle: The cavity size was normal. There was mild    concentric hypertrophy. Systolic function was normal. The    estimated ejection fraction was 55%. Wall motion was normal;    there were no regional wall motion abnormalities. Doppler    parameters are consistent with abnormal left ventricular    relaxation (grade 1 diastolic dysfunction).  - Aortic valve: Valve area (Vmax): 2.1 cm^2.  - Left atrium: The atrium was mildly dilated.  __________  See CV Studies in Epic for more remote cardiac imaging    EKG:  EKG is ordered today.  The EKG ordered today demonstrates atrial paced rhythm with occasional AV pacing and rare PVC, 78 bpm  Recent Labs: 05/16/2023: TSH 2.19 09/14/2023: ALT 15 09/21/2023: BUN 21; Creatinine, Ser 1.39; Hemoglobin 12.6; Platelets 203; Potassium 4.0; Sodium 141  Recent Lipid Panel    Component Value  Date/Time   CHOL 121 09/14/2023 0746   CHOL 103 09/16/2013 0438   TRIG 108.0 09/14/2023 0746   TRIG 95 09/16/2013 0438   HDL 31.60 (L) 09/14/2023 0746   HDL 30 (L) 09/16/2013 0438   CHOLHDL 4 09/14/2023 0746   VLDL 21.6 09/14/2023 0746   VLDL 19 09/16/2013 0438   LDLCALC 67 09/14/2023 0746   LDLCALC 54 09/16/2013 0438    PHYSICAL EXAM:    VS:  BP (!) 140/60 (BP Location: Left Arm, Patient Position: Sitting, Cuff Size: Normal)   Pulse 78   Ht 5' 10 (1.778 m)   Wt 221 lb (100.2 kg)   SpO2 98%   BMI 31.71 kg/m   BMI: Body mass index is 31.71 kg/m.  Physical Exam Vitals reviewed.  Constitutional:      Appearance: He is well-developed.  HENT:     Head:  Normocephalic and atraumatic.  Eyes:     General:        Right eye: No discharge.        Left eye: No discharge.  Neck:     Vascular: No JVD.  Cardiovascular:     Rate and Rhythm: Normal rate and regular rhythm.     Heart sounds: Normal heart sounds, S1 normal and S2 normal. Heart sounds not distant. No midsystolic click and no opening snap. No murmur heard.    No friction rub.  Pulmonary:     Effort: Pulmonary effort is normal. No respiratory distress.     Breath sounds: Normal breath sounds. No decreased breath sounds, wheezing, rhonchi or rales.  Chest:     Chest wall: No tenderness.  Abdominal:     General: There is distension.     Palpations: Abdomen is soft.     Tenderness: There is no abdominal tenderness.  Musculoskeletal:     Cervical back: Normal range of motion.     Comments: Trivial bilateral pretibial edema.   Skin:    General: Skin is warm and dry.     Nails: There is no clubbing.  Neurological:     Mental Status: He is alert and oriented to person, place, and time.  Psychiatric:        Speech: Speech normal.        Behavior: Behavior normal.        Thought Content: Thought content normal.        Judgment: Judgment normal.     Wt Readings from Last 3 Encounters:  10/02/23 221 lb (100.2 kg)   09/24/23 221 lb (100.2 kg)  09/21/23 221 lb 3.2 oz (100.3 kg)     ASSESSMENT & PLAN:   CAD involving native coronary arteries without angina: He is without symptoms of angina.  Recent LHC showed patent RCA and LCx stents with otherwise nonobstructive disease with medical management advised.  He is on apixaban  in lieu of aspirin .  Continue aggressive risk factor modification and second prevention including Imdur  30 mg and lovastatin .  No indication for further ischemic testing at this time.  Atrial A-fib/flutter: Maintaining sinus rhythm status post recent DCCV on Toprol -XL 25 mg.  Query if he is having paroxysms of atrial arrhythmia contributing to dyspnea and mild volume overload.  Recommend he follow-up with EP for ongoing management of atrial flutter with consideration of AAD if indicated based on device interrogation.  CHA2DS2-VASc at least 7 (CHF, HTN, age x 2, TIA x 2, vascular disease).  He remains on apixaban  5 mg twice daily and is without falls or symptoms concerning for bleeding.  Recent labs stable.  HFpEF: Recent echo showed preserved LV systolic function with grade 1 diastolic dysfunction.  Study was inadequate for assessing RVSP.  He does appear mildly volume up on exam today with a weight that has trended up from 215 pounds in 04/2018 5 to 221 pounds.  This coincides with the development of his shortness of breath.  Query if he is having paroxysms of A-fib/flutter contributing to dyspnea and fluid retention.  Device interrogation as outlined above.  We will have him take furosemide  40 mg daily for the next 4 days followed by transitioning his furosemide  to 40 mg on Mondays, Wednesdays, and Fridays to simplify dosing as he has difficulty taking furosemide  on Sundays.  Not currently on MRA with underlying renal dysfunction.  Consider addition of SGLT2 inhibitor moving forward.  Check BMP.  Symptomatic bradycardia status post PPM:  Followed by EP.  HTN: Blood pressure is mildly elevated in  the office today.  Diuresis as above.  He otherwise remains on Imdur  30 mg, losartan  25 mg, and Toprol -XL 25 mg.  Blood pressure typically well-controlled.  HLD: LDL 67 in 09/2023.  He remains on lovastatin .  AAA: Stable, ectatic dilatation of the distal abdominal aorta with largest diameter 2.8 cm (unchanged from 2023).  Followed by vascular surgery.  CKD stage III: Check BMP.     Disposition: F/u with Dr. Darron or an APP in 2-3 months and EP.   Medication Adjustments/Labs and Tests Ordered: Current medicines are reviewed at length with the patient today.  Concerns regarding medicines are outlined above. Medication changes, Labs and Tests ordered today are summarized above and listed in the Patient Instructions accessible in Encounters.   Signed, Bernardino Bring, PA-C 10/02/2023 11:10 AM     [facility] [place] [address] [place], [place] [address] [phone]

## 2023-10-02 ENCOUNTER — Encounter: Payer: Self-pay | Admitting: Physician Assistant

## 2023-10-02 ENCOUNTER — Ambulatory Visit: Attending: Physician Assistant | Admitting: Physician Assistant

## 2023-10-02 VITALS — BP 140/60 | HR 78 | Ht 70.0 in | Wt 221.0 lb

## 2023-10-02 DIAGNOSIS — E785 Hyperlipidemia, unspecified: Secondary | ICD-10-CM

## 2023-10-02 DIAGNOSIS — I5032 Chronic diastolic (congestive) heart failure: Secondary | ICD-10-CM

## 2023-10-02 DIAGNOSIS — I4892 Unspecified atrial flutter: Secondary | ICD-10-CM

## 2023-10-02 DIAGNOSIS — I2510 Atherosclerotic heart disease of native coronary artery without angina pectoris: Secondary | ICD-10-CM | POA: Diagnosis not present

## 2023-10-02 DIAGNOSIS — N183 Chronic kidney disease, stage 3 unspecified: Secondary | ICD-10-CM | POA: Diagnosis not present

## 2023-10-02 DIAGNOSIS — Z79899 Other long term (current) drug therapy: Secondary | ICD-10-CM | POA: Diagnosis not present

## 2023-10-02 DIAGNOSIS — R001 Bradycardia, unspecified: Secondary | ICD-10-CM

## 2023-10-02 DIAGNOSIS — I480 Paroxysmal atrial fibrillation: Secondary | ICD-10-CM

## 2023-10-02 DIAGNOSIS — I714 Abdominal aortic aneurysm, without rupture, unspecified: Secondary | ICD-10-CM

## 2023-10-02 DIAGNOSIS — Z950 Presence of cardiac pacemaker: Secondary | ICD-10-CM

## 2023-10-02 DIAGNOSIS — I10 Essential (primary) hypertension: Secondary | ICD-10-CM | POA: Diagnosis not present

## 2023-10-02 MED ORDER — POTASSIUM CHLORIDE ER 10 MEQ PO TBCR: 10.0000 meq | EXTENDED_RELEASE_TABLET | ORAL | 3 refills | Status: DC

## 2023-10-02 MED ORDER — FUROSEMIDE 40 MG PO TABS: 40.0000 mg | ORAL_TABLET | ORAL | 3 refills | Status: DC

## 2023-10-02 MED ORDER — METOPROLOL SUCCINATE ER 25 MG PO TB24: 25.0000 mg | ORAL_TABLET | Freq: Every day | ORAL | 3 refills | Status: AC

## 2023-10-02 NOTE — Patient Instructions (Signed)
 Medication Instructions:  Your physician recommends the following medication changes.  START TAKING: Lasix  40 mg daily for the next 4 days -- on Monday resume 40 mg daily, Monday, Wednesday, and Friday Potassium 10 mEq - 1 tab to be taken with Lasix   *If you need a refill on your cardiac medications before your next appointment, please call your pharmacy*  Lab Work: Your provider would like for you to have following labs drawn today BMeT.   If you have labs (blood work) drawn today and your tests are completely normal, you will receive your results only by: MyChart Message (if you have MyChart) OR A paper copy in the mail If you have any lab test that is abnormal or we need to change your treatment, we will call you to review the results.  Follow-Up: At [facility], you and your health needs are our priority.  As part of our continuing mission to provide you with exceptional heart care, our providers are all part of one team.  This team includes your primary Cardiologist (physician) and Advanced Practice Providers or APPs (Physician Assistants and Nurse Practitioners) who all work together to provide you with the care you need, when you need it.  Follow-up: Next available appointment with Suzann Riddle  Your next appointment:   3 month(s)  Provider:   You may see Deatrice Cage, MD or Bernardino Bring, PA-C

## 2023-10-03 ENCOUNTER — Ambulatory Visit: Payer: Self-pay | Admitting: Physician Assistant

## 2023-10-03 LAB — BASIC METABOLIC PANEL WITH GFR
BUN/Creatinine Ratio: 23 (ref 10–24)
BUN: 29 mg/dL — ABNORMAL HIGH (ref 8–27)
CO2: 18 mmol/L — ABNORMAL LOW (ref 20–29)
Calcium: 9.3 mg/dL (ref 8.6–10.2)
Chloride: 104 mmol/L (ref 96–106)
Creatinine, Ser: 1.25 mg/dL (ref 0.76–1.27)
Glucose: 78 mg/dL (ref 70–99)
Potassium: 4.9 mmol/L (ref 3.5–5.2)
Sodium: 138 mmol/L (ref 134–144)
eGFR: 56 mL/min/1.73 — ABNORMAL LOW (ref >59)

## 2023-10-09 ENCOUNTER — Ambulatory Visit (INDEPENDENT_AMBULATORY_CARE_PROVIDER_SITE_OTHER): Payer: Medicare HMO

## 2023-10-09 DIAGNOSIS — R001 Bradycardia, unspecified: Secondary | ICD-10-CM

## 2023-10-10 ENCOUNTER — Telehealth: Payer: Self-pay

## 2023-10-10 ENCOUNTER — Other Ambulatory Visit: Payer: Self-pay

## 2023-10-10 DIAGNOSIS — Z79899 Other long term (current) drug therapy: Secondary | ICD-10-CM

## 2023-10-10 LAB — CUP PACEART REMOTE DEVICE CHECK
Battery Remaining Longevity: 132 mo
Battery Voltage: 3.02 V
Brady Statistic AP VP Percent: 0.83 %
Brady Statistic AP VS Percent: 89.69 %
Brady Statistic AS VP Percent: 0.05 %
Brady Statistic AS VS Percent: 9.44 %
Brady Statistic RA Percent Paced: 92.26 %
Brady Statistic RV Percent Paced: 0.88 %
Date Time Interrogation Session: 20250804220816
Implantable Lead Connection Status: 753985
Implantable Lead Connection Status: 753985
Implantable Lead Implant Date: 20230130
Implantable Lead Implant Date: 20230130
Implantable Lead Location: 753859
Implantable Lead Location: 753860
Implantable Lead Model: 3830
Implantable Lead Model: 5076
Implantable Pulse Generator Implant Date: 20230130
Lead Channel Impedance Value: 323 Ohm
Lead Channel Impedance Value: 399 Ohm
Lead Channel Impedance Value: 399 Ohm
Lead Channel Impedance Value: 589 Ohm
Lead Channel Pacing Threshold Amplitude: 0.75 V
Lead Channel Pacing Threshold Amplitude: 0.75 V
Lead Channel Pacing Threshold Pulse Width: 0.4 ms
Lead Channel Pacing Threshold Pulse Width: 0.4 ms
Lead Channel Sensing Intrinsic Amplitude: 1.75 mV
Lead Channel Sensing Intrinsic Amplitude: 1.75 mV
Lead Channel Sensing Intrinsic Amplitude: 2.625 mV
Lead Channel Sensing Intrinsic Amplitude: 2.625 mV
Lead Channel Setting Pacing Amplitude: 1.5 V
Lead Channel Setting Pacing Amplitude: 2 V
Lead Channel Setting Pacing Pulse Width: 0.4 ms
Lead Channel Setting Sensing Sensitivity: 0.9 mV
Zone Setting Status: 755011

## 2023-10-10 NOTE — Telephone Encounter (Signed)
 Copied from CRM #[id number]. Topic: Appointments - Appointment Info/Confirmation >> Oct 10, 2023  8:17 AM Rosina BIRCH wrote: Patient/patient representative is calling for information regarding an appointment. Patient wife called stating she need clarity on some labs that was put in for the patient on 7/21 and today CB [phone]

## 2023-10-10 NOTE — Telephone Encounter (Signed)
 Called and spoke to pt wife who is on DPR, and let her know the lab from 7/21 would be collected when he comes to his lab appt in Oct. She said she understood now, but the cardiologist confused her . Pt did redo him BMP today.

## 2023-10-11 ENCOUNTER — Ambulatory Visit: Payer: Self-pay | Admitting: Nurse Practitioner

## 2023-10-11 ENCOUNTER — Ambulatory Visit: Payer: Self-pay | Admitting: Cardiology

## 2023-10-11 DIAGNOSIS — Z79899 Other long term (current) drug therapy: Secondary | ICD-10-CM

## 2023-10-11 LAB — BASIC METABOLIC PANEL WITH GFR
BUN/Creatinine Ratio: 16 (ref 10–24)
BUN: 20 mg/dL (ref 8–27)
CO2: 22 mmol/L (ref 20–29)
Calcium: 9.4 mg/dL (ref 8.6–10.2)
Chloride: 104 mmol/L (ref 96–106)
Creatinine, Ser: 1.26 mg/dL (ref 0.76–1.27)
Glucose: 96 mg/dL (ref 70–99)
Potassium: 5.1 mmol/L (ref 3.5–5.2)
Sodium: 139 mmol/L (ref 134–144)
eGFR: 56 mL/min/1.73 — ABNORMAL LOW (ref >59)

## 2023-10-12 ENCOUNTER — Other Ambulatory Visit: Payer: Self-pay | Admitting: Emergency Medicine

## 2023-10-12 DIAGNOSIS — Z79899 Other long term (current) drug therapy: Secondary | ICD-10-CM

## 2023-10-27 LAB — POTASSIUM: Potassium: 4.3 mmol/L (ref 3.5–5.2)

## 2023-10-29 ENCOUNTER — Ambulatory Visit: Payer: Self-pay | Admitting: Nurse Practitioner

## 2023-10-29 NOTE — Telephone Encounter (Signed)
  Patient's wife calling to discuss result

## 2023-10-29 NOTE — Telephone Encounter (Signed)
 Spoke with wife per release form and she was just calling to get results. Reviewed that with her and she verbalized understanding.

## 2023-10-30 ENCOUNTER — Ambulatory Visit

## 2023-10-30 VITALS — BP 102/72 | HR 85 | Ht 70.0 in | Wt 213.4 lb

## 2023-10-30 DIAGNOSIS — J069 Acute upper respiratory infection, unspecified: Secondary | ICD-10-CM

## 2023-10-30 MED ORDER — ALBUTEROL SULFATE HFA 108 (90 BASE) MCG/ACT IN AERS: 2.0000 | INHALATION_SPRAY | Freq: Four times a day (QID) | RESPIRATORY_TRACT | 0 refills | Status: DC | PRN

## 2023-10-30 MED ORDER — PREDNISONE 20 MG PO TABS: 20.0000 mg | ORAL_TABLET | Freq: Every day | ORAL | 0 refills | Status: AC

## 2023-10-30 MED ORDER — GUAIFENESIN-CODEINE 100-10 MG/5ML PO SOLN: 5.0000 mL | Freq: Three times a day (TID) | ORAL | 0 refills | Status: DC | PRN

## 2023-10-30 NOTE — Progress Notes (Signed)
 Acute Office Visit  Subjective:    Patient ID: SAFAL HALDERMAN, male    DOB: 03-05-1936, 88 y.o.   MRN: [id number]  Chief Complaint  Patient presents with   Cough   Nasal Congestion   Fatigue    Patient is in today for following acute visit: HPI Discussed the use of AI scribe software for clinical note transcription with the patient, who gave verbal consent to proceed.  History of Present Illness NATANAEL SALADIN is an 88 year old male who presents with a persistent cough following a recent COVID-19 infection.  He tested positive for COVID-19 on October 21, 2023, while in [place] . Initially asymptomatic, he developed fatigue and a fever of 100.63F after returning from a trip. This is his second COVID-19 infection, with the first occurring in 2022, which he describes as less severe than the current episode.  He describes the cough as sometimes dry and hacking, and at other times productive, initially with yellowish-green sputum, now mostly clear. No shortness of breath or chest pain is noted, but he experiences significant fatigue and lack of energy. He has been using Mucinex  and Allegra, which he started recently, but reports limited relief from the cough.  No recent fever, shortness of breath, or chest pain. He reports no leg swelling, except for occasional swelling in the right leg leg where he had a fracture 12-14 years ago.   ROS As per HPI    Objective:    BP 102/72 (BP Location: Left Arm, Patient Position: Sitting, Cuff Size: Normal)   Pulse 85   Ht 5' 10 (1.778 m)   Wt 213 lb 6.4 oz (96.8 kg)   SpO2 95%   BMI 30.62 kg/m    Physical Exam Constitutional:      General: He is not in acute distress.    Appearance: He is obese.  HENT:     Right Ear: Tympanic membrane and external ear normal.     Left Ear: Tympanic membrane and external ear normal.     Ears:     Comments: Wearing hearing aids b/l    Mouth/Throat:     Mouth: Mucous membranes are moist.     Pharynx: No  oropharyngeal exudate.  Cardiovascular:     Rate and Rhythm: Normal rate.  Pulmonary:     Effort: Pulmonary effort is normal.     Breath sounds: Wheezing (faint wheezing) present. No rales.  Musculoskeletal:     Cervical back: Neck supple. No tenderness.     Right lower leg: Edema (chronic 1+ pitting) present.     Left lower leg: No edema.  Lymphadenopathy:     Cervical: No cervical adenopathy.  Skin:    General: Skin is warm.  Neurological:     Mental Status: He is alert and oriented to person, place, and time.     No results found for any visits on 10/30/23.     Assessment & Plan:  Viral URI with cough Assessment & Plan: - Cough and fatigue following COVID-19 infection, dx on October 21, 2023. Out of antiviral treatment window. Sputum improving from yellowish-green to clear. Lungs clear, reducing pneumonia concern. Symptoms likely post-viral, expected to improve gradually. - Prescribed codeine  cough syrup 5 mL up to three times daily as needed for cough, cautioning about habit formation, sedation, respiratory depression discussed. PDMP reviewed. - Prescribed albuterol  inhaler for use every 6-8 hours as needed for wheezing. - Started prednisone  20 mg daily for 5 days, to be taken in the  morning with food. - Advised against using over-the-counter cough medications. - Instructed to seek emergency care if experiencing shortness of breath, chest pain, or leg swelling. - Advised wearing a mask and frequent hand washing to prevent further infections. - Encouraged hydration and walking as tolerated. - Advised follow-up with primary care provider if symptoms do not gradually improve.  Orders: -     Albuterol  Sulfate HFA; Inhale 2 puffs into the lungs every 6 (six) hours as needed for wheezing or shortness of breath.  Dispense: 8 g; Refill: 0 -     guaiFENesin -Codeine ; Take 5 mLs by mouth 3 (three) times daily as needed for cough.  Dispense: 237 mL; Refill: 0 -     predniSONE ; Take 1  tablet (20 mg total) by mouth daily with breakfast for 5 days.  Dispense: 5 tablet; Refill: 0    Return if symptoms worsen or fail to improve, for with PCP .  Luke Shade, MD

## 2023-10-30 NOTE — Patient Instructions (Signed)
 Recommend Guaifensin-codeine  100-10 mg/5 ml, three times a day as needed for cough.   Use albuterol  inhaler every 6-8 hourly for wheezing.    Start prednisone  20 mg tomorrow morning, take it with food.   If not better follow up with PCP.

## 2023-10-30 NOTE — Assessment & Plan Note (Addendum)
-   Cough and fatigue following COVID-19 infection, dx on October 21, 2023. Out of antiviral treatment window. Sputum improving from yellowish-green to clear. Lungs clear, reducing pneumonia concern. Symptoms likely post-viral, expected to improve gradually. - Prescribed codeine  cough syrup 5 mL up to three times daily as needed for cough, cautioning about habit formation, sedation, respiratory depression discussed. PDMP reviewed. - Prescribed albuterol  inhaler for use every 6-8 hours as needed for wheezing. - Started prednisone  20 mg daily for 5 days, to be taken in the morning with food. - Advised against using over-the-counter cough medications. - Instructed to seek emergency care if experiencing shortness of breath, chest pain, or leg swelling. - Advised wearing a mask and frequent hand washing to prevent further infections. - Encouraged hydration and walking as tolerated. - Advised follow-up with primary care provider if symptoms do not gradually improve.

## 2023-10-31 ENCOUNTER — Encounter: Payer: Self-pay | Admitting: Internal Medicine

## 2023-10-31 ENCOUNTER — Encounter: Payer: Self-pay | Admitting: Cardiology

## 2023-10-31 ENCOUNTER — Ambulatory Visit: Admitting: *Deleted

## 2023-10-31 ENCOUNTER — Ambulatory Visit: Attending: Cardiology | Admitting: Cardiology

## 2023-10-31 VITALS — Ht 70.0 in | Wt 216.0 lb

## 2023-10-31 VITALS — BP 122/58 | HR 78 | Ht 70.0 in | Wt 216.2 lb

## 2023-10-31 DIAGNOSIS — D6869 Other thrombophilia: Secondary | ICD-10-CM

## 2023-10-31 DIAGNOSIS — I480 Paroxysmal atrial fibrillation: Secondary | ICD-10-CM | POA: Diagnosis not present

## 2023-10-31 DIAGNOSIS — G4733 Obstructive sleep apnea (adult) (pediatric): Secondary | ICD-10-CM

## 2023-10-31 DIAGNOSIS — Z950 Presence of cardiac pacemaker: Secondary | ICD-10-CM

## 2023-10-31 DIAGNOSIS — R001 Bradycardia, unspecified: Secondary | ICD-10-CM | POA: Diagnosis not present

## 2023-10-31 DIAGNOSIS — Z0000 Encounter for general adult medical examination without abnormal findings: Secondary | ICD-10-CM | POA: Diagnosis not present

## 2023-10-31 DIAGNOSIS — G4730 Sleep apnea, unspecified: Secondary | ICD-10-CM

## 2023-10-31 LAB — CUP PACEART INCLINIC DEVICE CHECK
Date Time Interrogation Session: 20250827145145
Implantable Lead Connection Status: 753985
Implantable Lead Connection Status: 753985
Implantable Lead Implant Date: 20230130
Implantable Lead Implant Date: 20230130
Implantable Lead Location: 753859
Implantable Lead Location: 753860
Implantable Lead Model: 3830
Implantable Lead Model: 5076
Implantable Pulse Generator Implant Date: 20230130

## 2023-10-31 NOTE — Progress Notes (Signed)
 Electrophysiology Clinic Note    Date:  10/31/2023  Patient ID:  Nathaniel Thompson, Nathaniel Thompson [birth year], MRN [id number] PCP:  Glendia Shad, MD  Cardiologist:  Deatrice Cage, MD   Electrophysiologist:  Fonda Kitty, MD  (previously Dr. Fernande)    Discussed the use of AI scribe software for clinical note transcription with the patient, who gave verbal consent to proceed.   Patient Profile    Chief Complaint: AF follow-up  History of Present Illness: Nathaniel Thompson is a 88 y.o. male with PMH notable for bradycardia s/p PPM, parox AFib, aflutter, PVCs, NSVT, CAD s/p PCI, HFpEF, orthostatic hypotension, CKD-3, HTN, TIA; seen today for Fonda Kitty, MD for routine electrophysiology followup.   He last saw Dr. Fernande 06/2023 where he was doing well. He had urgent visit with DOD 09/2023 where he was in persistent Aflutter, s/p DCCV in ER.  He saw PA Dunn on follow-up where he noted several months of SOB typically with exertion. He had updated TTE And LHC that were overall very stable.   On follow-up today, he is doing ok. He and his wife were diagnosed with covid 2 weeks ago, he still has lingering cough. Lost appetite, but this has slowly returned. He continues to take eliquis  BID. Some bleeding with BM d/t hemorrhoid but this is not new or worsening. He denies dizziness, syncope, chest pain, chest pressure, palpitations.  He uses CPAP nightly, unsure who is managing.     Arrhythmia/Device History MDT PPM, imp 2023; dx SND    ROS:  Please see the history of present illness. All other systems are reviewed and otherwise negative.    Physical Exam    VS:  BP (!) 122/58   Pulse 78   Ht 5' 10 (1.778 m)   Wt 216 lb 3.2 oz (98.1 kg)   SpO2 98%   BMI 31.02 kg/m  BMI: Body mass index is 31.02 kg/m.      Wt Readings from Last 3 Encounters:  10/31/23 216 lb 3.2 oz (98.1 kg)  10/30/23 213 lb 6.4 oz (96.8 kg)  10/02/23 221 lb (100.2 kg)     GEN- The patient is well appearing,  alert and oriented x 3 today.   Lungs- Clear to ausculation bilaterally, normal work of breathing.  Heart- Regular rate and rhythm, no murmurs, rubs or gallops Extremities- Trace peripheral edema, warm, dry Skin-  device pocket well-healed, no tethering   Device interrogation done today and reviewed by myself:  Battery 10 years Lead thresholds, impedence, sensing stable  Several AFib/flutter episodes, usually very brief 10 min or less. Episodes typically occur during overnight hours No changes made today   Studies Reviewed   Previous EP, cardiology notes.    EKG is not ordered. Personal review of EKG from 10/02/2023 shows:  AP with frequent PVC        TTE, 08/10/2023  1. Left ventricular ejection fraction, by estimation, is 55 to 60%. The left ventricle has normal function. The left ventricle has no regional wall motion abnormalities. Left ventricular diastolic parameters are consistent with Grade I diastolic dysfunction (impaired relaxation).   2. Right ventricular systolic function is normal. The right ventricular size is normal. Tricuspid regurgitation signal is inadequate for assessing PA pressure.   3. Right atrial size was mildly dilated.   4. The mitral valve is degenerative. Mild mitral valve regurgitation. No evidence of mitral stenosis.   5. The aortic valve was not well visualized. Aortic valve regurgitation is not  visualized. No aortic stenosis is present.   LHC, 07/27/2023   Prox Cx to Mid Cx lesion is 10% stenosed.   Mid Cx lesion is 30% stenosed.   Mid RCA to Dist RCA lesion is 5% stenosed.   Prox RCA lesion is 20% stenosed.   Ost Cx to Prox Cx lesion is 30% stenosed.   1.  Patent RCA and left circumflex stents with minimal restenosis.  Mild nonobstructive disease overall. 2.  I could not cross the aortic valve due to significant tortuosity of the innominate artery.  Cardiac PET, 07/12/2023   LV perfusion is abnormal. There is evidence of ischemia. There is no  evidence of infarction. Defect 1: There is a medium defect with mild reduction in uptake present in the mid to basal anterolateral and inferolateral location(s) that is reversible. There is normal wall motion in the defect area. Consistent with ischemia.   End diastolic cavity size is normal. End systolic cavity size is normal.   Myocardial blood flow was computed to be 0.47ml/g/min at rest and 1.28ml/g/min at stress. Global myocardial blood flow reserve was 1.68 and was abnormal.   Coronary calcium  assessment not performed due to prior revascularization. Prior stents   Findings are consistent with ischemia. The study is intermediate risk.   Ischemia noted in the mid-basal inferiorlateral/anterolateral walls MBFR abnormal in this area 1.6    Assessment and Plan     #) parox AFib #) Aflutter Very low atrial arrhythmia burden except for period in mid-July.  He is s/p DCCV 7/18 and brief aflutter episodes since that time We discussed whether to initiate AAD vs continue to monitor, and given his low burden, favor continuing to monitor at this time  #) Hypercoag d/t afib CHA2DS2-VASc Score = at least 7 [CHF History: 1, HTN History: 1, Diabetes History: 0, Stroke History: 2, Vascular Disease History: 1, Age Score: 2, Gender Score: 0].  Therefore, the patient's annual risk of stroke is 11.2 %.    Stroke ppx - 5mg  eliquis  BID, appropriately dosed No significant bleeding concerns Recent CBC stable  #) OSA on CPAP Several AF episodes happening in overnight hours. Patient is unsure who is managing CPAP settings. Recommended they discuss with PCP to ensure correct device settings  #) SND s/p PPM Device functioning well, see paceart for details  #) NSVT Quiescent         Current medicines are reviewed at length with the patient today.   The patient does not have concerns regarding his medicines.  The following changes were made today:  none  Labs/ tests ordered today include:  No orders  of the defined types were placed in this encounter.    Disposition: Follow up with Dr. Kennyth or EP APP in 12 months, prefer MD to establish care after Dr. Celine retirement    Signed, Chantal Needle, NP  10/31/23  2:18 PM  Electrophysiology [facility]

## 2023-10-31 NOTE — Telephone Encounter (Signed)
 Please call and notify - I would like to refer him to pulmonary for evaluation of sleep apnea and evaluation of cpap usage. (Referra to pulmonary for further evaluation).

## 2023-10-31 NOTE — Patient Instructions (Signed)
 Mr. Nathaniel Thompson , Thank you for taking time out of your busy schedule to complete your Annual Wellness Visit with me. I enjoyed our conversation and look forward to speaking with you again next year. I, as well as your care team,  appreciate your ongoing commitment to your health goals. Please review the following plan we discussed and let me know if I can assist you in the future. Your Game plan/ To Do List    Referrals: If you haven't heard from the office you've been referred to, please reach out to them at the phone provided.  Remember to get your flu and covid vaccines.  Follow up Visits: We will see or speak with you next year for your Next Medicare AWV with our clinical staff 11/04/24 @ 10:50 Have you seen your provider in the last 6 months (3 months if uncontrolled diabetes)? Yes  Clinician Recommendations:  Aim for 30 minutes of exercise or brisk walking, 6-8 glasses of water, and 5 servings of fruits and vegetables each day.       This is a list of the screenings recommended for you:  Health Maintenance  Topic Date Due   COVID-19 Vaccine (7 - 2024-25 season) 11/05/2022   Flu Shot  10/05/2023   Medicare Annual Wellness Visit  10/30/2024   Pneumococcal Vaccine for age over 27  Completed   Zoster (Shingles) Vaccine  Completed   HPV Vaccine  Aged Out   Meningitis B Vaccine  Aged Out   DTaP/Tdap/Td vaccine  Discontinued    Advanced directives: (Copy Requested) Please bring a copy of your health care power of attorney and living will to the office to be added to your chart at your convenience. You can mail to [facility] [address] [place], [place] [address] or email to [email or web address] Advance Care Planning is important because it:  [x]  Makes sure you receive the medical care that is consistent with your values, goals, and preferences  [x]  It provides guidance to your family and loved ones and reduces their decisional burden about whether or not they are  making the right decisions based on your wishes.

## 2023-10-31 NOTE — Patient Instructions (Signed)
 Medication Instructions:   Your physician recommends that you continue on your current medications as directed. Please refer to the Current Medication list given to you today.    *If you need a refill on your cardiac medications before your next appointment, please call your pharmacy*  Lab Work:  No labs ordered today   If you have labs (blood work) drawn today and your tests are completely normal, you will receive your results only by: MyChart Message (if you have MyChart) OR A paper copy in the mail If you have any lab test that is abnormal or we need to change your treatment, we will call you to review the results.  Testing/Procedures:  No test ordered today   Follow-Up: At [facility], you and your health needs are our priority.  As part of our continuing mission to provide you with exceptional heart care, our providers are all part of one team.  This team includes your primary Cardiologist (physician) and Advanced Practice Providers or APPs (Physician Assistants and Nurse Practitioners) who all work together to provide you with the care you need, when you need it.  Your next appointment:   12 month(s)  Provider:   Suzann Riddle, NP or Dr. Kennyth    We recommend signing up for the patient portal called MyChart.  Sign up information is provided on this After Visit Summary.  MyChart is used to connect with patients for Virtual Visits (Telemedicine).  Patients are able to view lab/test results, encounter notes, upcoming appointments, etc.  Non-urgent messages can be sent to your provider as well.   To learn more about what you can do with MyChart, go to [email or web address].

## 2023-10-31 NOTE — Progress Notes (Signed)
 Subjective:   Nathaniel Thompson is a 88 y.o. who presents for a Medicare Wellness preventive visit.  As a reminder, Annual Wellness Visits don't include a physical exam, and some assessments may be limited, especially if this visit is performed virtually. We may recommend an in-person follow-up visit with your provider if needed.  Visit Complete: Virtual I connected with  Nathaniel Thompson on 10/31/23 by a audio enabled telemedicine application and verified that I am speaking with the correct person using two identifiers.  Patient Location: Home  Provider Location: Home Office  I discussed the limitations of evaluation and management by telemedicine. The patient expressed understanding and agreed to proceed.  Vital Signs: Because this visit was a virtual/telehealth visit, some criteria may be missing or patient reported. Any vitals not documented were not able to be obtained and vitals that have been documented are patient reported.  VideoDeclined- This patient declined Librarian, academic. Therefore the visit was completed with audio only.  Persons Participating in Visit: Patient.  AWV Questionnaire: No: Patient Medicare AWV questionnaire was not completed prior to this visit.  Cardiac Risk Factors include: advanced age (>73men, >38 women);male gender;hypertension;dyslipidemia;obesity (BMI >30kg/m2);Other (see comment), Risk factor comments: Pacemaker, CAD     Objective:    Today's Vitals   10/31/23 1535  Weight: 216 lb (98 kg)  Height: 5' 10 (1.778 m)   Body mass index is 30.99 kg/m.     10/31/2023    3:58 PM 09/21/2023    4:33 PM 09/13/2023    1:29 PM 08/14/2023   12:49 PM 07/27/2023    7:55 AM 05/18/2023    1:03 PM 02/20/2023    1:13 PM  Advanced Directives  Does Patient Have a Medical Advance Directive? Yes Yes Yes Yes Yes Yes Yes  Type of Estate agent of [place];Living will Healthcare Power of [place];Living will  Healthcare Power of [place];Living will  Healthcare Power of [place];Living will Healthcare Power of [place];Living will Healthcare Power of [place];Living will  Does patient want to make changes to medical advance directive?  No - Patient declined No - Patient declined      Copy of Healthcare Power of Attorney in Chart? No - copy requested No - copy requested Yes - validated most recent copy scanned in chart (See row information)   No - copy requested     Current Medications (verified) Outpatient Encounter Medications as of 10/31/2023  Medication Sig   albuterol  (VENTOLIN  HFA) 108 (90 Base) MCG/ACT inhaler Inhale 2 puffs into the lungs every 6 (six) hours as needed for wheezing or shortness of breath.   apixaban  (ELIQUIS ) 5 MG TABS tablet Take 1 tablet (5 mg total) by mouth 2 (two) times daily.   Calcium  Carb-Cholecalciferol  (CALCIUM  + D3 PO) Take 1 tablet by mouth daily.   cholecalciferol  (VITAMIN D3) 25 MCG (1000 UNIT) tablet Take 1,000 Units by mouth daily.   cyanocobalamin  (VITAMIN B12) 1000 MCG tablet Take 1,000 mcg by mouth daily.   Fe Bisgly-Vit C-Vit B12-FA (GENTLE IRON  PO) Take 28 mg by mouth daily.   furosemide  (LASIX ) 40 MG tablet Take 1 tablet (40 mg total) by mouth every Monday, Wednesday, and Friday.   guaiFENesin -codeine  100-10 MG/5ML syrup Take 5 mLs by mouth 3 (three) times daily as needed for cough.   isosorbide  mononitrate (IMDUR ) 30 MG 24 hr tablet Take 1 tablet (30 mg total) by mouth daily.   Lacosamide (VIMPAT) 100 MG TABS Take 100 mg by mouth in the  morning and at bedtime.   losartan  (COZAAR ) 25 MG tablet Take 1 tablet (25 mg total) by mouth daily.   lovastatin  (MEVACOR ) 40 MG tablet Take 2 tablets (80 mg total) by mouth at bedtime.   metoprolol  succinate (TOPROL -XL) 25 MG 24 hr tablet Take 1 tablet (25 mg total) by mouth daily.   Multiple Vitamin (MULTI-VITAMINS) TABS Take 1 tablet by mouth daily.    pantoprazole  (PROTONIX ) 40 MG tablet Take 40 mg by mouth 2 (two)  times daily.    predniSONE  (DELTASONE ) 20 MG tablet Take 1 tablet (20 mg total) by mouth daily with breakfast for 5 days.   No facility-administered encounter medications on file as of 10/31/2023.    Allergies (verified) Atorvastatin   History: Past Medical History:  Diagnosis Date   (HFpEF) heart failure with preserved ejection fraction (HCC)    a. 05/2018 Echo: Nl EF, Gr1 DD, sev dil LA, mild MR/TR, mild PAH; b. 05/2019 Echo: EF 55-60%, no rwma, mild LVH. Nl PASP. Mildly dil LA. Triv MR. Mild AoV sclerosis w/o stenosis. Mildly dil Asc AO (39mm); d. 02/2023 Echo [facility]): EF >55%, nl RV fxn, mild PR.   AAA (abdominal aortic aneurysm) (HCC)    Allergic rhinitis    Barrett's esophagus 10/21/2013   Bone cancer (HCC)    Cardiac pacemaker in situ    CKD (chronic kidney disease), stage III (HCC)    Colon polyp, hyperplastic    COPD (chronic obstructive pulmonary disease) (HCC)    mild COPD. former smoker   Coronary artery disease    a. 2003 s/p PCI ([facility]); b. 2004 s/p PCI x 2 ([facility]); c. 11/2017 Cath: LM nl, LAD mild dzs, LCX patent stent w/ 30% distal edge restenosis, RCA patent distal stent w/ 30% prox edge stenosis. Nl EF->Med Rx; d. 02/2021 Cath: LM nl, LAD min irregs, D1/2 min irregs, LCX 10p ISR, 85m, RCA 34m/d ISR, EF 65%; e. 07/2023 PET Stress: antlat and inflat ischemia.   Diverticulosis    Fundic gland polyps of stomach, benign    Gastritis    GERD (gastroesophageal reflux disease)    Hypercholesteremia    Hypertension    PAF (paroxysmal atrial fibrillation) (HCC)    a. CHA2DS2VASc = 7-->eliquis .   Prostate cancer (HCC)    Prostatism    Pulmonary embolism (HCC)    a. 2023 - lobar and segmental PE in setting of RSV.   Reflux esophagitis    Renal stones    Skin cancer    left cheek/lesion excised   Sleep apnea    Symptomatic bradycardia    Tachy-brady syndrome (HCC)    a. 03/2021 s/p MDT MRI compatible DC PPM (ser # [id number]).   Tachycardia    a. 11/2017 admit w/ tachycardia/?  afib-->no afib noted upon review (amio/OAC d/c'd).   TIA (transient ischemic attack)    a. 02/2023 admit [facility] - no stroke on MRI; b. 06/2022 EEG: nl for age.   Past Surgical History:  Procedure Laterality Date   CARDIAC CATHETERIZATION     COLONOSCOPY WITH PROPOFOL  N/A 09/13/2015   Procedure: COLONOSCOPY WITH PROPOFOL ;  Surgeon: Gladis RAYMOND Mariner, MD;  Location: [facility] ENDOSCOPY;  Service: Endoscopy;  Laterality: N/A;   COLONOSCOPY WITH PROPOFOL  N/A 05/07/2019   Procedure: COLONOSCOPY WITH PROPOFOL ;  Surgeon: Dessa Reyes ORN, MD;  Location: [facility] ENDOSCOPY;  Service: Endoscopy;  Laterality: N/A;   CORONARY ANGIOPLASTY     ESOPHAGOGASTRODUODENOSCOPY (EGD) WITH PROPOFOL  N/A 09/13/2015   Procedure: ESOPHAGOGASTRODUODENOSCOPY (EGD) WITH PROPOFOL ;  Surgeon: Gladis  RAYMOND Mariner, MD;  Location: [facility] ENDOSCOPY;  Service: Endoscopy;  Laterality: N/A;   ESOPHAGOGASTRODUODENOSCOPY (EGD) WITH PROPOFOL  N/A 12/28/2015   Procedure: ESOPHAGOGASTRODUODENOSCOPY (EGD) WITH PROPOFOL ;  Surgeon: Gladis RAYMOND Mariner, MD;  [facility] ENDOSCOPY;  Service: Endoscopy;  Laterality: N/A;   ESOPHAGOGASTRODUODENOSCOPY (EGD) WITH PROPOFOL  N/A 06/16/2016   Procedure: ESOPHAGOGASTRODUODENOSCOPY (EGD) WITH PROPOFOL ;  Surgeon: Gladis RAYMOND Mariner, MD;  Location: [facility] ENDOSCOPY;  Service: Endoscopy;  Laterality: N/A;   ESOPHAGOGASTRODUODENOSCOPY (EGD) WITH PROPOFOL  N/A 05/07/2019   Procedure: ESOPHAGOGASTRODUODENOSCOPY (EGD) WITH PROPOFOL ;  Surgeon: Dessa Reyes ORN, MD;  Location: [facility] ENDOSCOPY;  Service: Endoscopy;  Laterality: N/A;   EYE SURGERY  2013   CATARACT EXTRACTION   GANGLION CYST EXCISION Right 05/18/2017   Procedure: REMOVAL GANGLION CYST ANKLE;  Surgeon: Ashley Soulier, DPM;  Location: [facility] ORS;  Service: Podiatry;  Laterality: Right;   heart cath stent     LEFT HEART CATH AND CORONARY ANGIOGRAPHY Right 12/03/2017   Procedure: Left Heart Cath and Coronary Angiography with possible coronary intervention;  Surgeon: Fernand Denyse LABOR, MD;  Location: [facility] INVASIVE CV LAB;  Service: Cardiovascular;  Laterality: Right;   LEFT HEART CATH AND CORONARY ANGIOGRAPHY Left 07/27/2023   Procedure: LEFT HEART CATH AND CORONARY ANGIOGRAPHY;  Surgeon: Darron Deatrice LABOR, MD;  Location: [facility] INVASIVE CV LAB;  Service: Cardiovascular;  Laterality: Left;   PACEMAKER IMPLANT N/A 04/04/2021   Procedure: PACEMAKER IMPLANT;  Surgeon: Fernande Elspeth BROCKS, MD;  Location: [facility] INVASIVE CV LAB;  Service: Cardiovascular;  Laterality: N/A;   PROSTATE BIOPSY     RIGHT/LEFT HEART CATH AND CORONARY ANGIOGRAPHY N/A 02/07/2021   Procedure: RIGHT/LEFT HEART CATH AND CORONARY ANGIOGRAPHY;  Surgeon: Darron Deatrice LABOR, MD;  Location: [facility] INVASIVE CV LAB;  Service: Cardiovascular;  Laterality: N/A;   stents     multiple   Family History  Problem Relation Age of Onset   Cancer Mother        gastric and lung   Cancer Father        multiple myeloma   Stroke Father    Cancer Sister        leukemia   Cancer Brother        leukemia   Cancer Brother        kidney   Cancer Daughter        Uterine   Cancer Other        Nephes (Sister's Son): Prostate   Social History   Socioeconomic History   Marital status: Married    Spouse name: Glenda   Number of children: 2   Years of education: Not on file   Highest education level: Not on file  Occupational History   Occupation: retired Production designer, theatre/television/film for [employer]  Tobacco Use   Smoking status: Former    Current packs/day: 0.00    Average packs/day: 1 pack/day for 35.0 years (35.0 ttl pk-yrs)    Types: Cigarettes    Start date: 03/07/1951    Quit date: 03/06/1986    Years since quitting: 37.6   Smokeless tobacco: Former    Types: Chew    Quit date: 1985   Tobacco comments:    Started smoking around 88 yrs old.    Smoked 1 PPD at his heaviest.  Vaping Use   Vaping status: Never Used  Substance and Sexual Activity   Alcohol use: No   Drug use: No   Sexual activity: Not Currently  Other Topics  Concern   Not on file  Social History  Narrative   Married, Glenda 2 step children and 2 children from previous marriage   Social Drivers of Corporate investment banker Strain: Low Risk  (10/31/2023)   Overall Financial Resource Strain (CARDIA)    Difficulty of Paying Living Expenses: Not hard at all  Food Insecurity: No Food Insecurity (10/31/2023)   Hunger Vital Sign    Worried About Running Out of Food in the Last Year: Never true    Ran Out of Food in the Last Year: Never true  Transportation Needs: No Transportation Needs (10/31/2023)   PRAPARE - Administrator, Civil Service (Medical): No    Lack of Transportation (Non-Medical): No  Physical Activity: Insufficiently Active (10/31/2023)   Exercise Vital Sign    Days of Exercise per Week: 4 days    Minutes of Exercise per Session: 10 min  Stress: No Stress Concern Present (10/31/2023)   [employer] of Occupational Health - Occupational Stress Questionnaire    Feeling of Stress: Not at all  Social Connections: Socially Integrated (10/31/2023)   Social Connection and Isolation Panel    Frequency of Communication with Friends and Family: More than three times a week    Frequency of Social Gatherings with Friends and Family: More than three times a week    Attends Religious Services: More than 4 times per year    Active Member of [employer] or Organizations: Yes    Attends Engineer, structural: More than 4 times per year    Marital Status: Married    Tobacco Counseling Counseling given: Not Answered Tobacco comments: Started smoking around 88 yrs old. Smoked 1 PPD at his heaviest.    Clinical Intake:  Pre-visit preparation completed: Yes  Pain : No/denies pain     BMI - recorded: 30.99 Nutritional Status: BMI > 30  Obese Nutritional Risks: None Diabetes: No  Lab Results  Component Value Date   HGBA1C 5.7 09/14/2023   HGBA1C 5.7 05/16/2023   HGBA1C 5.8 07/28/2022     How often do you need to  have someone help you when you read instructions, pamphlets, or other written materials from your doctor or pharmacy?: 1 - Never  Interpreter Needed?: No  Information entered by :: R. Hatsumi Steinhart LPN   Activities of Daily Living     10/31/2023    3:37 PM  In your present state of health, do you have any difficulty performing the following activities:  Hearing? 1  Vision? 0  Difficulty concentrating or making decisions? 0  Walking or climbing stairs? 1  Dressing or bathing? 0  Doing errands, shopping? 0  Preparing Food and eating ? N  Using the Toilet? N  In the past six months, have you accidently leaked urine? N  Do you have problems with loss of bowel control? N  Managing your Medications? Y  Managing your Finances? Y  Housekeeping or managing your Housekeeping? Y    Patient Care Team: Glendia Shad, MD as PCP - General (Internal Medicine) Darron Deatrice LABOR, MD as PCP - Cardiology (Cardiology) Kennyth Chew, MD as PCP - Electrophysiology (Clinical Cardiac Electrophysiology) Darron Deatrice LABOR, MD as Consulting Physician (Cardiology) Rennie Cindy SAUNDERS, MD as Consulting Physician (Hematology and Oncology) Jama, Cordella MATSU, MD (Vascular Surgery) Jane Delmar Pike, NP as Nurse Practitioner (Gastroenterology) Tamea Dedra CROME, MD as Consulting Physician (Pulmonary Disease)  I have updated your Care Teams any recent Medical Services you may have received from other providers in the past year.  Assessment:   This is a routine wellness examination for Conny.  Hearing/Vision screen Hearing Screening - Comments:: Wears aids Vision Screening - Comments:: readers   Goals Addressed             This Visit's Progress    Patient Stated       Wants to try to be able to do more and walk more       Depression Screen     10/31/2023    3:46 PM 10/30/2023    4:30 PM 03/15/2023   11:31 AM 03/08/2023    7:19 AM 11/03/2022    7:17 AM 10/19/2022    3:09 PM 04/27/2022     8:29 AM  PHQ 2/9 Scores  PHQ - 2 Score 0 0 0 0 0 0 0  PHQ- 9 Score 1 1 1  1  0     Fall Risk     10/31/2023    3:40 PM 10/30/2023    4:30 PM 03/15/2023   11:31 AM 11/03/2022    7:16 AM 10/19/2022    3:13 PM  Fall Risk   Falls in the past year? 0 0 0 1 1  Number falls in past yr: 0 0 0 0 0  Injury with Fall? 0 0 0 1 1  Risk for fall due to : No Fall Risks No Fall Risks No Fall Risks History of fall(s) History of fall(s)  Follow up Falls evaluation completed;Falls prevention discussed Falls evaluation completed Falls evaluation completed Falls evaluation completed     MEDICARE RISK AT HOME:  Medicare Risk at Home Any stairs in or around the home?: Yes If so, are there any without handrails?: No Home free of loose throw rugs in walkways, pet beds, electrical cords, etc?: Yes Adequate lighting in your home to reduce risk of falls?: Yes Life alert?: No Use of a cane, walker or w/c?: Yes Grab bars in the bathroom?: Yes Shower chair or bench in shower?: No Elevated toilet seat or a handicapped toilet?: Yes  TIMED UP AND GO:  Was the test performed?  No  Cognitive Function: 6CIT completed        10/31/2023    3:59 PM 10/19/2022    3:15 PM 07/16/2019    9:46 AM 07/15/2018    1:47 PM 07/11/2017    9:00 AM  6CIT Screen  What Year? 0 points 0 points 0 points 0 points 0 points  What month? 0 points 0 points 0 points 0 points 0 points  What time? 0 points 0 points 0 points 0 points 0 points  Count back from 20 0 points 0 points  0 points 0 points  Months in reverse 0 points 0 points 0 points 0 points 0 points  Repeat phrase 0 points 0 points  0 points 0 points  Total Score 0 points 0 points  0 points 0 points    Immunizations Immunization History  Administered Date(s) Administered   Fluad Quad(high Dose 65+) 11/05/2018, 11/06/2019, 11/17/2020, 11/24/2021   Fluad Trivalent(High Dose 65+) 11/14/2022   INFLUENZA, HIGH DOSE SEASONAL PF 12/26/2012, 12/17/2014, 12/17/2015, 11/28/2016    Influenza, Seasonal, Injecte, Preservative Fre 12/22/2010   Influenza,inj,Quad PF,6+ Mos 12/28/2011, 12/18/2013   Influenza-Unspecified 12/05/2011, 12/05/2015, 11/04/2017   Moderna Covid-19 Vaccine Bivalent Booster 60yrs & up 12/06/2021   PFIZER Comirnaty(Gray Top)Covid-19 Tri-Sucrose Vaccine 04/28/2019, 05/26/2019   PFIZER(Purple Top)SARS-COV-2 Vaccination 03/25/2019, 04/19/2019, 12/11/2019   Pneumococcal Conjugate-13 07/12/2016   Pneumococcal Polysaccharide-23 03/06/2006   Td 09/03/1992   Zoster Recombinant(Shingrix )  01/07/2019, 03/10/2019    Screening Tests Health Maintenance  Topic Date Due   COVID-19 Vaccine (7 - 2024-25 season) 11/05/2022   Medicare Annual Wellness (AWV)  10/19/2023   INFLUENZA VACCINE  10/05/2023   Pneumococcal Vaccine: 50+ Years  Completed   Zoster Vaccines- Shingrix   Completed   HPV VACCINES  Aged Out   Meningococcal B Vaccine  Aged Out   DTaP/Tdap/Td  Discontinued    Health Maintenance  Health Maintenance Due  Topic Date Due   COVID-19 Vaccine (7 - 2024-25 season) 11/05/2022   Medicare Annual Wellness (AWV)  10/19/2023   INFLUENZA VACCINE  10/05/2023   Health Maintenance Items Addressed: Discussed the need to update flu and covid vaccines. Patient declines tetanus vaccine.   Additional Screening:  Vision Screening: Recommended annual ophthalmology exams for early detection of glaucoma and other disorders of the eye. Up to date [facility] Would you like a referral to an eye doctor? No    Dental Screening: Recommended annual dental exams for proper oral hygiene  Community Resource Referral / Chronic Care Management: CRR required this visit?  No   CCM required this visit?  No   Plan:    I have personally reviewed and noted the following in the patient's chart:   Medical and social history Use of alcohol, tobacco or illicit drugs  Current medications and supplements including opioid prescriptions. Patient is not currently taking  opioid prescriptions. Functional ability and status Nutritional status Physical activity Advanced directives List of other physicians Hospitalizations, surgeries, and ER visits in previous 12 months Vitals Screenings to include cognitive, depression, and falls Referrals and appointments  In addition, I have reviewed and discussed with patient certain preventive protocols, quality metrics, and best practice recommendations. A written personalized care plan for preventive services as well as general preventive health recommendations were provided to patient.   Angeline Fredericks, LPN   [id number]   After Visit Summary: (MyChart) Due to this being a telephonic visit, the after visit summary with patients personalized plan was offered to patient via MyChart   Notes: Nothing significant to report at this time.

## 2023-11-01 ENCOUNTER — Other Ambulatory Visit: Payer: Self-pay

## 2023-11-01 DIAGNOSIS — G4730 Sleep apnea, unspecified: Secondary | ICD-10-CM

## 2023-11-01 NOTE — Telephone Encounter (Signed)
 Order has already been placed for referral to pulrmonay.

## 2023-11-01 NOTE — Telephone Encounter (Signed)
 Called pt wife and they are ok with being referred to pulmonary. Pt preferred [place] placing order to [facility]- Pulmonary.

## 2023-11-04 ENCOUNTER — Ambulatory Visit: Payer: Self-pay | Admitting: Cardiology

## 2023-11-06 ENCOUNTER — Encounter: Admitting: Cardiology

## 2023-11-07 ENCOUNTER — Encounter: Payer: Self-pay | Admitting: Sleep Medicine

## 2023-11-07 ENCOUNTER — Ambulatory Visit: Admitting: Sleep Medicine

## 2023-11-07 VITALS — BP 132/72 | HR 83 | Temp 97.6°F | Ht 70.0 in | Wt 215.2 lb

## 2023-11-07 DIAGNOSIS — I10 Essential (primary) hypertension: Secondary | ICD-10-CM

## 2023-11-07 DIAGNOSIS — G4733 Obstructive sleep apnea (adult) (pediatric): Secondary | ICD-10-CM | POA: Diagnosis not present

## 2023-11-07 NOTE — Progress Notes (Signed)
 Name:Nathaniel Thompson MRN: [id number] DOB: [birth year]   CHIEF COMPLAINT:  ESTABLISH CARE FOR OSA   HISTORY OF PRESENT ILLNESS: Nathaniel Thompson is a 88 y.o. w/ a h/o OSA, atrial flutter, HTN, hyperlipidemia and obesity who presents to establish care for OSA. Reports using CPAP therapy every night, which is confirmed by compliance data. He is currently using Airfit F20 FFM, which is comfortable. Denies air leaks or nasal congestion. Reports feeling more refreshed upon awakening with CPAP therapy. Reports nocturnal awakenings due to nocturia, however does not have difficulty falling back to sleep. Reports weight loss. Admits to occasional dry mouth. Denies morning headaches, RLS symptoms, dream enactment, cataplexy, hypnagogic or hypnapompic hallucinations. Reports a family history of sleep apnea. Denies drowsy driving. Drinks 2 cups of coffee daily, denies alcohol, tobacco or illicit drug use.   Bedtime 10 pm Sleep onset 10 mins Rise time 6:30 am   EPWORTH SLEEP SCORE    12/10/2017    9:00 AM  Results of the Epworth flowsheet  Sitting and reading 1  Watching TV 1  Sitting, inactive in a public place (e.g. a theatre or a meeting) 1  As a passenger in a car for an hour without a break 0  Lying down to rest in the afternoon when circumstances permit 1  Sitting and talking to someone 0  Sitting quietly after a lunch without alcohol 0  In a car, while stopped for a few minutes in traffic 0  Total score 4     PAST MEDICAL HISTORY :   has a past medical history of (HFpEF) heart failure with preserved ejection fraction (HCC), AAA (abdominal aortic aneurysm) (HCC), Allergic rhinitis, Barrett's esophagus (10/21/2013), Bone cancer (HCC), Cardiac pacemaker in situ, CKD (chronic kidney disease), stage III (HCC), Colon polyp, hyperplastic, COPD (chronic obstructive pulmonary disease) (HCC), Coronary artery disease, Diverticulosis, Fundic gland polyps of stomach, benign, Gastritis, GERD  (gastroesophageal reflux disease), Hypercholesteremia, Hypertension, PAF (paroxysmal atrial fibrillation) (HCC), Prostate cancer (HCC), Prostatism, Pulmonary embolism (HCC), Reflux esophagitis, Renal stones, Skin cancer, Sleep apnea, Symptomatic bradycardia, Tachy-brady syndrome (HCC), Tachycardia, and TIA (transient ischemic attack).  has a past surgical history that includes Cardiac catheterization; Eye surgery (2013); Colonoscopy with propofol  (N/A, 09/13/2015); Esophagogastroduodenoscopy (egd) with propofol  (N/A, 09/13/2015); Prostate biopsy; stents; Esophagogastroduodenoscopy (egd) with propofol  (N/A, 12/28/2015); Coronary angioplasty; heart cath stent; Esophagogastroduodenoscopy (egd) with propofol  (N/A, 06/16/2016); Ganglion cyst excision (Right, 05/18/2017); LEFT HEART CATH AND CORONARY ANGIOGRAPHY (Right, 12/03/2017); Esophagogastroduodenoscopy (egd) with propofol  (N/A, 05/07/2019); Colonoscopy with propofol  (N/A, 05/07/2019); RIGHT/LEFT HEART CATH AND CORONARY ANGIOGRAPHY (N/A, 02/07/2021); PACEMAKER IMPLANT (N/A, 04/04/2021); and LEFT HEART CATH AND CORONARY ANGIOGRAPHY (Left, 07/27/2023). Prior to Admission medications   Medication Sig Start Date End Date Taking? Authorizing Provider  albuterol  (VENTOLIN  HFA) 108 (90 Base) MCG/ACT inhaler Inhale 2 puffs into the lungs every 6 (six) hours as needed for wheezing or shortness of breath. 10/30/23   Bair, Luke, MD  apixaban  (ELIQUIS ) 5 MG TABS tablet Take 1 tablet (5 mg total) by mouth 2 (two) times daily. 08/14/23   Darron Deatrice LABOR, MD  Calcium  Carb-Cholecalciferol  (CALCIUM  + D3 PO) Take 1 tablet by mouth daily.    [provider]  cholecalciferol  (VITAMIN D3) 25 MCG (1000 UNIT) tablet Take 1,000 Units by mouth daily.    [provider]  cyanocobalamin  (VITAMIN B12) 1000 MCG tablet Take 1,000 mcg by mouth daily.    [provider]  Fe Bisgly-Vit C-Vit B12-FA (GENTLE IRON  PO) Take 28 mg by  mouth daily.    [provider]   furosemide  (LASIX ) 40 MG tablet Take 1 tablet (40 mg total) by mouth every Monday, Wednesday, and Friday. 10/03/23   Dunn, Bernardino HERO, PA-C  guaiFENesin -codeine  100-10 MG/5ML syrup Take 5 mLs by mouth 3 (three) times daily as needed for cough. 10/30/23   Bair, Kalpana, MD  isosorbide  mononitrate (IMDUR ) 30 MG 24 hr tablet Take 1 tablet (30 mg total) by mouth daily. 09/24/23   Glendia Shad, MD  Lacosamide (VIMPAT) 100 MG TABS Take 100 mg by mouth in the morning and at bedtime.    [provider]  losartan  (COZAAR ) 25 MG tablet Take 1 tablet (25 mg total) by mouth daily. 04/20/23   Darron Deatrice LABOR, MD  lovastatin  (MEVACOR ) 40 MG tablet Take 2 tablets (80 mg total) by mouth at bedtime. 09/24/23   Glendia Shad, MD  metoprolol  succinate (TOPROL -XL) 25 MG 24 hr tablet Take 1 tablet (25 mg total) by mouth daily. 10/02/23   Abigail Bernardino HERO, PA-C  Multiple Vitamin (MULTI-VITAMINS) TABS Take 1 tablet by mouth daily.     [provider]  pantoprazole  (PROTONIX ) 40 MG tablet Take 40 mg by mouth 2 (two) times daily.     [provider]   Allergies  Allergen Reactions   Atorvastatin Other (See Comments)    Achy joints    FAMILY HISTORY:  family history includes Cancer in his brother, brother, daughter, father, mother, sister, and another family member; Stroke in his father. SOCIAL HISTORY:  reports that he quit smoking about 37 years ago. His smoking use included cigarettes. He started smoking about 72 years ago. He has a 35 pack-year smoking history. He quit smokeless tobacco use about 40 years ago.  His smokeless tobacco use included chew. He reports that he does not drink alcohol and does not use drugs.   Review of Systems:  Gen:  Denies  fever, sweats, chills weight loss  HEENT: Denies blurred vision, double vision, ear pain, eye pain, hearing loss, nose bleeds, sore throat Cardiac:  No dizziness, chest pain or heaviness, chest tightness,edema, No JVD Resp:   No cough,  -sputum production, -shortness of breath,-wheezing, -hemoptysis,  Gi: Denies swallowing difficulty, stomach pain, nausea or vomiting, diarrhea, constipation, bowel incontinence Gu:  Denies bladder incontinence, burning urine Ext:   Denies Joint pain, stiffness or swelling Skin: Denies  skin rash, easy bruising or bleeding or hives Endoc:  Denies polyuria, polydipsia , polyphagia or weight change Psych:   Denies depression, insomnia or hallucinations  Other:  All other systems negative  VITAL SIGNS: BP 132/72   Pulse 83   Temp 97.6 F (36.4 C) (Temporal)   Ht 5' 10 (1.778 m)   Wt 215 lb 3.2 oz (97.6 kg)   SpO2 97%   BMI 30.88 kg/m    Physical Examination:   General Appearance: No distress  EYES PERRLA, EOM intact.   NECK Supple, No JVD Pulmonary: normal breath sounds, No wheezing.  CardiovascularNormal S1,S2.  No m/r/g.   Abdomen: Benign, Soft, non-tender. Skin:   warm, no rashes, no ecchymosis  Extremities: normal, no cyanosis, clubbing. Neuro:without focal findings,  speech normal  PSYCHIATRIC: Mood, affect within normal limits.   ASSESSMENT AND PLAN  OSA Patient is using and benefiting from CPAP therapy. Discussed the consequences of untreated sleep apnea. Advised not to drive drowsy for safety of patient and others. Will follow up in 6 months.   HTN Stable, on current management. Following with PCP.  Patient  satisfied with Plan of action and management. All questions answered  I spent a total of 40 minutes reviewing chart data, face-to-face evaluation with the patient, counseling and coordination of care as detailed above.    Xzavion Doswell, M.D.  Sleep Medicine [facility]

## 2023-11-07 NOTE — Patient Instructions (Signed)

## 2023-11-12 ENCOUNTER — Encounter: Payer: Self-pay | Admitting: Internal Medicine

## 2023-11-12 ENCOUNTER — Inpatient Hospital Stay: Attending: Internal Medicine

## 2023-11-12 DIAGNOSIS — N183 Chronic kidney disease, stage 3 unspecified: Secondary | ICD-10-CM | POA: Diagnosis not present

## 2023-11-12 DIAGNOSIS — C61 Malignant neoplasm of prostate: Secondary | ICD-10-CM | POA: Insufficient documentation

## 2023-11-12 DIAGNOSIS — D509 Iron deficiency anemia, unspecified: Secondary | ICD-10-CM | POA: Diagnosis present

## 2023-11-12 LAB — CBC WITH DIFFERENTIAL (CANCER CENTER ONLY)
Abs Immature Granulocytes: 0.02 K/uL (ref 0.00–0.07)
Basophils Absolute: 0 K/uL (ref 0.0–0.1)
Basophils Relative: 0 %
Eosinophils Absolute: 0.5 K/uL (ref 0.0–0.5)
Eosinophils Relative: 8 %
HCT: 31.4 % — ABNORMAL LOW (ref 39.0–52.0)
Hemoglobin: 10.6 g/dL — ABNORMAL LOW (ref 13.0–17.0)
Immature Granulocytes: 0 %
Lymphocytes Relative: 14 %
Lymphs Abs: 1 K/uL (ref 0.7–4.0)
MCH: 30.2 pg (ref 26.0–34.0)
MCHC: 33.8 g/dL (ref 30.0–36.0)
MCV: 89.5 fL (ref 80.0–100.0)
Monocytes Absolute: 0.9 K/uL (ref 0.1–1.0)
Monocytes Relative: 13 %
Neutro Abs: 4.4 K/uL (ref 1.7–7.7)
Neutrophils Relative %: 65 %
Platelet Count: 168 K/uL (ref 150–400)
RBC: 3.51 MIL/uL — ABNORMAL LOW (ref 4.22–5.81)
RDW: 14.1 % (ref 11.5–15.5)
WBC Count: 6.8 K/uL (ref 4.0–10.5)
nRBC: 0 % (ref 0.0–0.2)

## 2023-11-12 LAB — CMP (CANCER CENTER ONLY)
ALT: 15 U/L (ref 0–44)
AST: 23 U/L (ref 15–41)
Albumin: 3.3 g/dL — ABNORMAL LOW (ref 3.5–5.0)
Alkaline Phosphatase: 41 U/L (ref 38–126)
Anion gap: 10 (ref 5–15)
BUN: 13 mg/dL (ref 8–23)
CO2: 19 mmol/L — ABNORMAL LOW (ref 22–32)
Calcium: 8.8 mg/dL — ABNORMAL LOW (ref 8.9–10.3)
Chloride: 107 mmol/L (ref 98–111)
Creatinine: 1.16 mg/dL (ref 0.61–1.24)
GFR, Estimated: >60 mL/min (ref >60)
Glucose, Bld: 110 mg/dL — ABNORMAL HIGH (ref 70–99)
Potassium: 3.7 mmol/L (ref 3.5–5.1)
Sodium: 136 mmol/L (ref 135–145)
Total Bilirubin: 0.8 mg/dL (ref 0.0–1.2)
Total Protein: 6 g/dL — ABNORMAL LOW (ref 6.5–8.1)

## 2023-11-12 LAB — IRON AND TIBC
Iron: 39 ug/dL — ABNORMAL LOW (ref 45–182)
Saturation Ratios: 16 % — ABNORMAL LOW (ref 17.9–39.5)
TIBC: 251 ug/dL (ref 250–450)
UIBC: 212 ug/dL

## 2023-11-12 LAB — PSA: Prostatic Specific Antigen: <0.02 ng/mL (ref 0.00–4.00)

## 2023-11-12 LAB — FERRITIN: Ferritin: 90 ng/mL (ref 24–336)

## 2023-11-12 NOTE — Telephone Encounter (Signed)
 Ok to get flu vaccines now.

## 2023-11-13 NOTE — Telephone Encounter (Signed)
 Noted

## 2023-11-14 ENCOUNTER — Inpatient Hospital Stay: Admitting: Internal Medicine

## 2023-11-14 ENCOUNTER — Inpatient Hospital Stay

## 2023-11-14 ENCOUNTER — Encounter: Payer: Self-pay | Admitting: Internal Medicine

## 2023-11-14 VITALS — BP 127/67

## 2023-11-14 VITALS — BP 112/59 | HR 79 | Temp 98.2°F | Resp 18 | Ht 70.0 in | Wt 215.5 lb

## 2023-11-14 DIAGNOSIS — C61 Malignant neoplasm of prostate: Secondary | ICD-10-CM

## 2023-11-14 DIAGNOSIS — D509 Iron deficiency anemia, unspecified: Secondary | ICD-10-CM | POA: Diagnosis not present

## 2023-11-14 MED ORDER — IRON SUCROSE 20 MG/ML IV SOLN
200.0000 mg | Freq: Once | INTRAVENOUS | Status: AC
  Administered 2023-11-14: 200 mg via INTRAVENOUS
  Filled 2023-11-14: qty 10

## 2023-11-14 NOTE — Progress Notes (Signed)
 C/o having SOB, no worse and coughing. Had covid 5 weeks ago. Would like to discuss the issue of not being able to get rid of the cough.  09/21/23 cardio version [facility], Dr. Jacolyn. He states Dr.Ready, pulmonology, kept listening to his lungs in one area, she said it was fine t he is concerned she heard something in his lungs.

## 2023-11-14 NOTE — Progress Notes (Signed)
 [facility] OFFICE PROGRESS NOTE  Patient Care Team: Glendia Shad, MD as PCP - General (Internal Medicine) Darron Deatrice LABOR, MD as PCP - Cardiology (Cardiology) Kennyth Chew, MD as PCP - Electrophysiology (Clinical Cardiac Electrophysiology) Darron Deatrice LABOR, MD as Consulting Physician (Cardiology) Rennie Cindy SAUNDERS, MD as Consulting Physician (Hematology and Oncology) Jama, Cordella MATSU, MD (Vascular Surgery) Jane Delmar Pike, NP as Nurse Practitioner (Gastroenterology) Tamea Dedra CROME, MD as Consulting Physician (Pulmonary Disease)   Cancer Staging  No matching staging information was found for the patient.    Oncology History Overview Note  Nov 2017- completed IM RT radiation therapy to his prostate and pelvic nodes for Gleason 7 (4+3) adenocarcinoma the prostate presenting the PSA of 7.8.  # JAN 2019- METASTATIC PROSTATE CA to Bone [low volume met on bone scan; CT- NED]; PSA ~50; March 25th 2019- Lupron  45 mg IM [Urology q 84M]; June 26, 2018-Eligard  every 3 months[intolerance to Lupron /question A. Fib/injection site pain  # May 23rd Zytiga  250mg /day; no prednisone ; STOPPED MARCH 17th 2021-repeated severe hypokalemia; Eligard   # Barretts- EGD/ colo [March 2021; Dr.Byrnett].   # [place] [[facility] [place]]; Oct 2019-paroxysmal A.fib/not on eliquis ; [Dr.Khan] -Dr.Arida ; Dizzy spells [Dr.Shah] s/p PPM [2023]  ------------------------------------------------------------------  DIAGNOSIS: [ JAN 2019]- Met- PROSTATE CANCER  STAGE:  IV       ;GOALS: PALLIATIVE     Prostate cancer (HCC)    INTERVAL HISTORY: Patient is with his wife.  He is walking with a cane.   Lynwood SAUNDERS Budge 88 y.o.  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  In the interim patient diagnosed with COVID.  About 5 weeks  ago.  Continues to have ongoing shortness of breath and cough.   Also patient recently admitted to hospital for A-fib with RVR.  09/21/23 cardio version [facility].  Chronic  right hip pain. Knees hurt.  Bowels good. Denies pelvic pain or probs urinating. No blood in urine  Continues have intermittent hemorrhoid that bleeds on the toilet tissue bright red blood.  No pain in pelvis. Voiding well. Appetite is fair. Occ hot flashes remain.   Review of Systems  Constitutional:  Negative for chills, diaphoresis, fever, malaise/fatigue and weight loss.  HENT:  Negative for nosebleeds and sore throat.   Eyes:  Negative for double vision.  Respiratory:  Negative for cough, hemoptysis, sputum production, shortness of breath and wheezing.   Cardiovascular:  Negative for chest pain, palpitations, orthopnea and leg swelling.  Gastrointestinal:  Negative for abdominal pain, blood in stool, constipation, diarrhea, heartburn, melena, nausea and vomiting.  Genitourinary:  Negative for dysuria, frequency and urgency.  Musculoskeletal:  Positive for myalgias. Negative for back pain and joint pain.  Skin: Negative.  Negative for itching and rash.  Neurological:  Negative for tingling, focal weakness, weakness and headaches.  Endo/Heme/Allergies:  Does not bruise/bleed easily.  Psychiatric/Behavioral:  Negative for depression. The patient is not nervous/anxious and does not have insomnia.     PAST MEDICAL HISTORY :  Past Medical History:  Diagnosis Date   (HFpEF) heart failure with preserved ejection fraction (HCC)    a. 05/2018 Echo: Nl EF, Gr1 DD, sev dil LA, mild MR/TR, mild PAH; b. 05/2019 Echo: EF 55-60%, no rwma, mild LVH. Nl PASP. Mildly dil LA. Triv MR. Mild AoV sclerosis w/o stenosis. Mildly dil Asc AO (39mm); d. 02/2023  Echo [facility]): EF >55%, nl RV fxn, mild PR.   AAA (abdominal aortic aneurysm) (HCC)    Allergic rhinitis    Barrett's esophagus 10/21/2013   Bone cancer (HCC)    Cardiac pacemaker in situ     CKD (chronic kidney disease), stage III (HCC)    Colon polyp, hyperplastic    COPD (chronic obstructive pulmonary disease) (HCC)    mild COPD. former smoker   Coronary artery disease    a. 2003 s/p PCI ([facility]); b. 2004 s/p PCI x 2 ([facility]); c. 11/2017 Cath: LM nl, LAD mild dzs, LCX patent stent w/ 30% distal edge restenosis, RCA patent distal stent w/ 30% prox edge stenosis. Nl EF->Med Rx; d. 02/2021 Cath: LM nl, LAD min irregs, D1/2 min irregs, LCX 10p ISR, 14m, RCA 32m/d ISR, EF 65%; e. 07/2023 PET Stress: antlat and inflat ischemia.   Diverticulosis    Fundic gland polyps of stomach, benign    Gastritis    GERD (gastroesophageal reflux disease)    Hypercholesteremia    Hypertension    PAF (paroxysmal atrial fibrillation) (HCC)    a. CHA2DS2VASc = 7-->eliquis .   Prostate cancer (HCC)    Prostatism    Pulmonary embolism (HCC)    a. 2023 - lobar and segmental PE in setting of RSV.   Reflux esophagitis    Renal stones    Skin cancer    left cheek/lesion excised   Sleep apnea    Symptomatic bradycardia    Tachy-brady syndrome (HCC)    a. 03/2021 s/p MDT MRI compatible DC PPM (ser # [id number]).   Tachycardia    a. 11/2017 admit w/ tachycardia/? afib-->no afib noted upon review (amio/OAC d/c'd).   TIA (transient ischemic attack)    a. 02/2023 admit [facility] - no stroke on MRI; b. 06/2022 EEG: nl for age.    PAST SURGICAL HISTORY :   Past Surgical History:  Procedure Laterality Date   CARDIAC CATHETERIZATION     COLONOSCOPY WITH PROPOFOL  N/A 09/13/2015   Procedure: COLONOSCOPY WITH PROPOFOL ;  Surgeon: Gladis RAYMOND Mariner, MD;  Location[facility] ENDOSCOPY;  Service: Endoscopy;  Laterality: N/A;   COLONOSCOPY WITH PROPOFOL  N/A 05/07/2019   Procedure: COLONOSCOPY WITH PROPOFOL ;  Surgeon: Dessa Reyes ORN, MD;  Location: [facility] ENDOSCOPY;  Service: Endoscopy;  Laterality: N/A;   CORONARY ANGIOPLASTY     ESOPHAGOGASTRODUODENOSCOPY (EGD) WITH PROPOFOL  N/A 09/13/2015   Procedure:  ESOPHAGOGASTRODUODENOSCOPY (EGD) WITH PROPOFOL ;  Surgeon: Gladis RAYMOND Mariner, MD;  Location: [facility] ENDOSCOPY;  Service: Endoscopy;  Laterality: N/A;   ESOPHAGOGASTRODUODENOSCOPY (EGD) WITH PROPOFOL  N/A 12/28/2015   Procedure: ESOPHAGOGASTRODUODENOSCOPY (EGD) WITH PROPOFOL ;  Surgeon: Gladis RAYMOND Mariner, MD;  Location: [facility] ENDOSCOPY;  Service: Endoscopy;  Laterality: N/A;   ESOPHAGOGASTRODUODENOSCOPY (EGD) WITH PROPOFOL  N/A 06/16/2016   Procedure: ESOPHAGOGASTRODUODENOSCOPY (EGD) WITH PROPOFOL ;  Surgeon: Gladis RAYMOND Mariner, MD;  Location: [facility] ENDOSCOPY;  Service: Endoscopy;  Laterality: N/A;   ESOPHAGOGASTRODUODENOSCOPY (EGD) WITH PROPOFOL  N/A 05/07/2019   Procedure: ESOPHAGOGASTRODUODENOSCOPY (EGD) WITH PROPOFOL ;  Surgeon: Dessa Reyes ORN, MD;  Location: [facility] ENDOSCOPY;  Service: Endoscopy;  Laterality: N/A;   EYE SURGERY  2013   CATARACT EXTRACTION   GANGLION CYST EXCISION Right 05/18/2017   Procedure: REMOVAL GANGLION CYST ANKLE;  Surgeon: Ashley Soulier, DPM;  Location: [facility] ORS;  Service: Podiatry;  Laterality: Right;   heart cath stent     LEFT HEART CATH AND CORONARY ANGIOGRAPHY Right 12/03/2017   Procedure: Left Heart Cath and Coronary Angiography with possible coronary intervention;  Surgeon: Fernand Denyse LABOR, MD;  Location: [facility] INVASIVE CV LAB;  Service: Cardiovascular;  Laterality: Right;   LEFT HEART CATH AND CORONARY ANGIOGRAPHY Left 07/27/2023   Procedure: LEFT HEART CATH AND CORONARY ANGIOGRAPHY;  Surgeon: Darron Deatrice LABOR, MD;  Location: [facility] INVASIVE CV LAB;  Service: Cardiovascular;  Laterality: Left;   PACEMAKER IMPLANT N/A 04/04/2021   Procedure: PACEMAKER IMPLANT;  Surgeon: Fernande Elspeth BROCKS, MD;  Location: [facility] INVASIVE CV LAB;  Service: Cardiovascular;  Laterality: N/A;   PROSTATE BIOPSY     RIGHT/LEFT HEART CATH AND CORONARY ANGIOGRAPHY N/A 02/07/2021   Procedure: RIGHT/LEFT HEART CATH AND CORONARY ANGIOGRAPHY;  Surgeon: Darron Deatrice LABOR, MD;  Location: [facility] INVASIVE CV LAB;   Service: Cardiovascular;  Laterality: N/A;   stents     multiple    FAMILY HISTORY :   Family History  Problem Relation Age of Onset   Cancer Mother        gastric and lung   Cancer Father        multiple myeloma   Stroke Father    Cancer Sister        leukemia   Cancer Brother        leukemia   Cancer Brother        kidney   Cancer Daughter        Uterine   Cancer Other        [place] Clinical cytogeneticist Son): Prostate    SOCIAL HISTORY:   Social History   Tobacco Use   Smoking status: Former    Current packs/day: 0.00    Average packs/day: 1 pack/day for 35.0 years (35.0 ttl pk-yrs)    Types: Cigarettes    Start date: [birth year]/07/1949    Quit date: 03/06/1986    Years since quitting: 37.7   Smokeless tobacco: Former    Types: Chew    Quit date: 1985   Tobacco comments:    Started smoking around 88 yrs old.    Smoked 1 PPD at his heaviest.  Vaping Use   Vaping status: Never Used  Substance Use Topics   Alcohol use: No   Drug use: No    ALLERGIES:  is allergic to atorvastatin.  MEDICATIONS:  Current Outpatient Medications  Medication Sig Dispense Refill   albuterol  (VENTOLIN  HFA) 108 (90 Base) MCG/ACT inhaler Inhale 2 puffs into the lungs every 6 (six) hours as needed for wheezing or shortness of breath. 8 g 0   apixaban  (ELIQUIS ) 5 MG TABS tablet Take 1 tablet (5 mg total) by mouth 2 (two) times daily. 14 tablet 0   Calcium  Carb-Cholecalciferol  (CALCIUM  + D3 PO) Take 1 tablet by mouth daily.     cholecalciferol  (VITAMIN D3) 25 MCG (1000 UNIT) tablet Take 1,000 Units by mouth daily.     cyanocobalamin  (VITAMIN B12) 1000 MCG tablet Take 1,000 mcg by mouth daily.     Fe Bisgly-Vit C-Vit B12-FA (GENTLE IRON  PO) Take 28 mg by mouth daily.     furosemide  (LASIX ) 40 MG tablet Take 1 tablet (40 mg total) by mouth every Monday, Wednesday, and Friday. 45 tablet 3   isosorbide  mononitrate (IMDUR ) 30 MG 24 hr tablet Take 1 tablet (30 mg total) by mouth daily. 90 tablet 2   Lacosamide  (VIMPAT) 100 MG TABS Take 100 mg by mouth in the morning and at bedtime.     losartan  (COZAAR ) 25 MG tablet Take 1 tablet (25 mg total) by mouth daily. 90 tablet 3   lovastatin  (MEVACOR ) 40 MG tablet Take 2 tablets (80 mg  total) by mouth at bedtime. 180 tablet 1   metoprolol  succinate (TOPROL -XL) 25 MG 24 hr tablet Take 1 tablet (25 mg total) by mouth daily. 90 tablet 3   Multiple Vitamin (MULTI-VITAMINS) TABS Take 1 tablet by mouth daily.      pantoprazole  (PROTONIX ) 40 MG tablet Take 40 mg by mouth 2 (two) times daily.      No current facility-administered medications for this visit.    PHYSICAL EXAMINATION: ECOG PERFORMANCE STATUS: 0 - Asymptomatic  BP (!) 112/59 (BP Location: Left Arm, Patient Position: Sitting, Cuff Size: Large)   Pulse 79   Temp 98.2 F (36.8 C) (Oral)   Resp 18   Ht 5' 10 (1.778 m)   Wt 215 lb 8 oz (97.8 kg)   SpO2 97%   BMI 30.92 kg/m   Filed Weights   11/14/23 1321  Weight: 215 lb 8 oz (97.8 kg)    Patient in wheelchair; left leg in the brace  Physical Exam HENT:     Head: Normocephalic and atraumatic.     Mouth/Throat:     Pharynx: No oropharyngeal exudate.  Eyes:     Pupils: Pupils are equal, round, and reactive to light.  Cardiovascular:     Rate and Rhythm: Normal rate and regular rhythm.  Pulmonary:     Effort: No respiratory distress.     Breath sounds: No wheezing.  Abdominal:     General: Bowel sounds are normal. There is no distension.     Palpations: Abdomen is soft. There is no mass.     Tenderness: There is no abdominal tenderness. There is no guarding or rebound.  Musculoskeletal:        General: No tenderness. Normal range of motion.     Cervical back: Normal range of motion and neck supple.  Skin:    General: Skin is warm.  Neurological:     Mental Status: He is alert and oriented to person, place, and time.  Psychiatric:        Mood and Affect: Affect normal.     LABORATORY DATA:  I have reviewed the data as  listed    Component Value Date/Time   NA 136 11/12/2023 0913   NA 139 10/10/2023 0808   NA 140 09/16/2013 0438   K 3.7 11/12/2023 0913   K 4.2 09/16/2013 0438   CL 107 11/12/2023 0913   CL 109 (H) 09/16/2013 0438   CO2 19 (L) 11/12/2023 0913   CO2 24 09/16/2013 0438   GLUCOSE 110 (H) 11/12/2023 0913   GLUCOSE 101 (H) 09/16/2013 0438   BUN 13 11/12/2023 0913   BUN 20 10/10/2023 0808   BUN 20 (H) 09/16/2013 0438   CREATININE 1.16 11/12/2023 0913   CREATININE 1.28 09/16/2013 0438   CALCIUM  8.8 (L) 11/12/2023 0913   CALCIUM  8.3 (L) 09/16/2013 0438   PROT 6.0 (L) 11/12/2023 0913   PROT 6.7 09/15/2013 0755   ALBUMIN 3.3 (L) 11/12/2023 0913   ALBUMIN 3.5 09/15/2013 0755   AST 23 11/12/2023 0913   ALT 15 11/12/2023 0913   ALT 28 09/15/2013 0755   ALKPHOS 41 11/12/2023 0913   ALKPHOS 44 (L) 09/15/2013 0755   BILITOT 0.8 11/12/2023 0913   GFRNONAA >60 11/12/2023 0913   GFRNONAA 54 (L) 09/16/2013 0438   GFRAA 59 (L) 10/07/2019 1305   GFRAA >60 09/16/2013 0438    No results found for: SPEP, UPEP  Lab Results  Component Value Date   WBC 6.8 11/12/2023   NEUTROABS  4.4 11/12/2023   HGB 10.6 (L) 11/12/2023   HCT 31.4 (L) 11/12/2023   MCV 89.5 11/12/2023   PLT 168 11/12/2023      Chemistry      Component Value Date/Time   NA 136 11/12/2023 0913   NA 139 10/10/2023 0808   NA 140 09/16/2013 0438   K 3.7 11/12/2023 0913   K 4.2 09/16/2013 0438   CL 107 11/12/2023 0913   CL 109 (H) 09/16/2013 0438   CO2 19 (L) 11/12/2023 0913   CO2 24 09/16/2013 0438   BUN 13 11/12/2023 0913   BUN 20 10/10/2023 0808   BUN 20 (H) 09/16/2013 0438   CREATININE 1.16 11/12/2023 0913   CREATININE 1.28 09/16/2013 0438      Component Value Date/Time   CALCIUM  8.8 (L) 11/12/2023 0913   CALCIUM  8.3 (L) 09/16/2013 0438   ALKPHOS 41 11/12/2023 0913   ALKPHOS 44 (L) 09/15/2013 0755   AST 23 11/12/2023 0913   ALT 15 11/12/2023 0913   ALT 28 09/15/2013 0755   BILITOT 0.8 11/12/2023 0913        RADIOGRAPHIC STUDIES: I have personally reviewed the radiological images as listed and agreed with the findings in the report. No results found.   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  Currently getting intermittent Eligard . MAY 2025-- PSA <0.01.  Hold Eligard  today.  If significantly elevated would recommend Eligard . If PSA rising would consider bone scan.  Stable.   # Iron  deficient anemia/CKD- III-[s/p EGD March 2021]--status post IV iron  infusion x1 [FEB 2022]-MARCH 2025--hemoglobin is 11-12; ferritin-14  Overall stable.  Continue gentle iron  [iron  biglycinate; 28 mg ] 1 pill a day.  Venofer  today-  # Nov 26th, 2023-acute PE -lobar and segmental PE [[facility]]/ A.fib - on Eliquis  [; Dr.Aridano asprin/plavix ]-provoked left foot surgery. However, pt on indefinite Eliquis  5 mg twice daily. Stable.   # worsening cough-  recent COVID- recommend use inhaler; and continue robitussin prn- if not improved follow up with PCP.   # TIA-Dec 2024/ [Hx of stroke]- MRI- [facility] [Dr.Hamlin]- negative for any new stroke; old strokes-awaiing EEG- APRIL 2025- his routine EEG, performed during awake and asleep state, is largely normal. There were no seizures, epileptiform activity, or push button events. ; again discussed re: bleeding precautions on eliquis .   Stable. On Vimpat [neurology-[facility]]   #CKD stage III-GFR 40-50s. Clinically Stable.   Eligard  22.5 q4 m- ? nov 2023.  # DISPOSITION:  # HOLD eligard ; #  Venofer  today # Venofer  in 1 month- # Follow up in 3 months-  MD; 2-3 days prior- labs- cbc.cmp/iron  studies; ferritin; PSA eligard ; possible venofer - Dr.B  [facility]; Dr.Scott/  No orders of the defined types were placed in this encounter.  All questions were answered. The patient knows to call the clinic with any problems, questions or concerns.      Cindy JONELLE Joe, MD 11/14/2023 1:55 PM

## 2023-11-14 NOTE — Assessment & Plan Note (Addendum)
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  Currently getting intermittent Eligard . MAY 2025-- PSA <0.01.  Hold Eligard  today.  If significantly elevated would recommend Eligard . If PSA rising would consider bone scan.  Stable.   # Iron  deficient anemia/CKD- III-[s/p EGD March 2021]--status post IV iron  infusion x1 [FEB 2022]-MARCH 2025--hemoglobin is 11-12; ferritin-14  Overall stable.  Continue gentle iron  [iron  biglycinate; 28 mg ] 1 pill a day.  Venofer  today-  # Nov 26th, 2023-acute PE -lobar and segmental PE [[facility]]/ A.fib - on Eliquis  [; Dr.Aridano asprin/plavix ]-provoked left foot surgery. However, pt on indefinite Eliquis  5 mg twice daily. Stable.   # worsening cough-  recent COVID- recommend use inhaler; and continue robitussin prn- if not improved follow up with PCP.   # TIA-Dec 2024/ [Hx of stroke]- MRI- [facility] [Dr.Hamlin]- negative for any new stroke; old strokes-awaiing EEG- APRIL 2025- his routine EEG, performed during awake and asleep state, is largely normal. There were no seizures, epileptiform activity, or push button events. ; again discussed re: bleeding precautions on eliquis .   Stable. On Vimpat [neurology-[facility]]   #CKD stage III-GFR 40-50s. Clinically Stable.   Eligard  22.5 q4 m- ? nov 2023.  # DISPOSITION:  # HOLD eligard ; #  Venofer  today # Venofer  in 1 month- # Follow up in 3 months-  MD; 2-3 days prior- labs- [facility].cmp/iron  studies; ferritin; PSA eligard ; possible venofer - Dr.B  Cc; Dr.Scott/

## 2023-11-14 NOTE — Patient Instructions (Signed)

## 2023-11-16 ENCOUNTER — Ambulatory Visit (INDEPENDENT_AMBULATORY_CARE_PROVIDER_SITE_OTHER)

## 2023-11-16 ENCOUNTER — Ambulatory Visit: Payer: Self-pay

## 2023-11-16 VITALS — BP 130/74 | HR 92 | Temp 98.3°F | Ht 70.0 in | Wt 216.0 lb

## 2023-11-16 DIAGNOSIS — R053 Chronic cough: Secondary | ICD-10-CM

## 2023-11-16 DIAGNOSIS — R9389 Abnormal findings on diagnostic imaging of other specified body structures: Secondary | ICD-10-CM | POA: Insufficient documentation

## 2023-11-16 MED ORDER — BUDESONIDE-FORMOTEROL FUMARATE 80-4.5 MCG/ACT IN AERO: 2.0000 | INHALATION_SPRAY | Freq: Two times a day (BID) | RESPIRATORY_TRACT | 0 refills | Status: DC

## 2023-11-16 MED ORDER — ALBUTEROL SULFATE HFA 108 (90 BASE) MCG/ACT IN AERS: 2.0000 | INHALATION_SPRAY | Freq: Four times a day (QID) | RESPIRATORY_TRACT | 0 refills | Status: DC | PRN

## 2023-11-16 NOTE — Progress Notes (Addendum)
 Acute Office Visit  Subjective:    Patient ID: Nathaniel Thompson, male    DOB: [birth year], 88 y.o.   MRN: [id number]  Chief Complaint  Patient presents with   Wheezing   Cough   Patient is in today for non-productive cough evaluation. HPI - History obtained from both patient and his wife. - COVID-19 at home test positive on 10/21/2023 presenting for evaluation of ongoing cough since COVID-19 infection. He was seen by me on 10/30/2023 and was started on Albuterol  inhaler, treated with Prednisone  20 mg for 5 days, Guaifesin-Codeine  cough syrup during last visit. He felt better with above treatment and was back to his baseline. - However, over the last week he has had noticeable cough, worsened over the last 2 days he has had dry cough. He has been using Albuterol  inhaler 2-3 times over the last 2 days, taken Codine cough but has not helped. Patient has not have fever, no sore throat, chest pain.   - He has been able to use CPAP at night and has been able to use it despite having cough. He does not seem to have problem with cough at night. Talking, laughing triggers cough. No hemoptysis, orthopnea, unintentional weight loss, night sweats. Has a h/o dry cough at baseline.   Pertinent positive:  Metastatic prostate cancer, stage IV currently managed by heme/onc with treatment with Eligard  infusion. A-fib with RVR.  09/21/23 cardio version [facility].  Seizure history 2023 PE on Eliquis  5 mg BID OSA on CPAP  HTN: treated with Losartan , Metoprolol   GERD: Chronic, on Protonix , denies worsening symptoms.   ROS As per HPI    Objective:    BP 130/74 (BP Location: Right Arm, Patient Position: Sitting, Cuff Size: Normal)   Pulse 92   Temp 98.3 F (36.8 C) (Oral)   Ht 5' 10 (1.778 m)   Wt 216 lb (98 kg)   SpO2 94%   BMI 30.99 kg/m    Physical Exam Constitutional:      Appearance: He is obese.  HENT:     Right Ear: Tympanic membrane normal. There is no impacted cerumen.     Left Ear:  Tympanic membrane normal. There is no impacted cerumen.     Ears:     Comments: Wearing hearing aids b/l    Nose: Congestion present. No rhinorrhea.  Cardiovascular:     Rate and Rhythm: Normal rate.  Pulmonary:     Effort: Pulmonary effort is normal.     Breath sounds: Normal breath sounds. No wheezing or rales.     Comments: Non-productive cough, intermittent throughout office visit Abdominal:     Palpations: Abdomen is soft.     Tenderness: There is no guarding.  Musculoskeletal:     Cervical back: Neck supple.  Neurological:     Mental Status: He is alert and oriented to person, place, and time.     Chest x-ray 11/16/23: Radiology read: Stable calcified granuloma overlying the right anterior second rib. There is mild coarsening of the interstitial markings, essentially similar to prior studies. Findings are nonspecific and differential diagnosis includes underlying pulmonary edema, chronic interstitial lung disease, emphysema, etc. Bilateral lung fields are otherwise clear. No acute consolidation or lung collapse. Bilateral costophrenic angles are clear.     Assessment & Plan:   Persistent dry cough Assessment & Plan: Recurrent persistent, non-productive cough.  COVID-19 at home test positive on 10/21/2023.  Seen by me on 10/30/23 and treatment with Prednisone  20 mg for 5 days, Albuterol   inhaler prn and Guaifesin-Codeine  cough syrup with symptoms at baseline, has a h/o chronic cough.  Non-productive cough, worsening over the last 2 days.  Chest x-ray done on 11/16/23 negative for pneumonia.  D/D upper airway cough syndrome, GERD, ARB induced cough, post-viral cough.   Recommend: Hold off Losartan  till follow up with PCP in October.  Trial of Symbicort  BID. Rinse mouth after use to prevent oral candidiasis.  Take Pantoprazole  40 mg in empty stomach in AM. Humidifier in bedroom.   Given x-ray finding, patient's co-morbidities recommend pulmonology evaluation for PFT to r/o  COPD contributing to recurrent cough.      Orders: -     DG Chest 2 View; Future -     Budesonide -Formoterol  Fumarate; Inhale 2 puffs into the lungs 2 (two) times daily.  Dispense: 6.9 g; Refill: 0 -     Pulmonary Visit  Abnormal chest x-ray -     Pulmonary Visit    Return in about 4 weeks (around 12/14/2023) for 3-4 weeks with PCP for cough follow up .  Luke Shade, MD

## 2023-11-16 NOTE — Progress Notes (Signed)
 Please let the patient know I reviewed his x-ray which does not suggest active pneumonia. He does have some non-specific findings in the chest x-ray and I recommend pulmonology referral to evaluate persistent cough including pulmonary function test. I have put a referral to pulmonology, I anticipate he will get a phone call to schedule an appointment. At the meantime I recommend trial of the following to help with cough:  - Trial of inhaler called Symbicort  2 puffs, twice daily.  Rinse mouth after use to prevent oral fungal infection which can happen when using Symbicort .  - Hold off using albuterol  and codeine  cough syrup.  - Hold off on Losartan  till appointment with Dr. Glendia in October. Sometimes Losartan  group medication can cause cough.  - Take acid reducing medication or Protonix  one tablet first thing in the morning in an empty stomach.  - I am forwarding this message to Dr. Glendia to update her on our plan.   Thank you,  Luke Shade, MD

## 2023-11-16 NOTE — Assessment & Plan Note (Addendum)
 Recurrent persistent, non-productive cough.  COVID-19 at home test positive on 10/21/2023.  Seen by me on 10/30/23 and treatment with Prednisone  20 mg for 5 days, Albuterol  inhaler prn and Guaifesin-Codeine  cough syrup with symptoms at baseline, has a h/o chronic cough.  Non-productive cough, worsening over the last 2 days.  Chest x-ray done on 11/16/23 negative for pneumonia.  D/D upper airway cough syndrome, GERD, ARB induced cough, post-viral cough.   Recommend: Hold off Losartan  till follow up with PCP in October.  Trial of Symbicort  BID. Rinse mouth after use to prevent oral candidiasis.  Take Pantoprazole  40 mg in empty stomach in AM. Humidifier in bedroom.   Given x-ray finding, patient's co-morbidities recommend pulmonology evaluation for PFT to r/o COPD contributing to recurrent cough.

## 2023-11-16 NOTE — Telephone Encounter (Signed)
 Copied from CRM #[id number]. Topic: Clinical - Prescription Issue >> Nov 16, 2023  1:41 PM Mia F wrote: Reason for CRM: Mandy from [facility] recieved a rx for budesonide -formoterol  (SYMBICORT ) 80-4.5 MCG/ACT inhaler  but the quanity need to be changed to a 10.2 instead of 6.9.   [phone]

## 2023-11-19 MED ORDER — BUDESONIDE-FORMOTEROL FUMARATE 80-4.5 MCG/ACT IN AERO: 2.0000 | INHALATION_SPRAY | Freq: Two times a day (BID) | RESPIRATORY_TRACT | 0 refills | Status: DC

## 2023-11-19 NOTE — Telephone Encounter (Signed)
 1. Persistent dry cough - budesonide -formoterol  (SYMBICORT ) 80-4.5 MCG/ACT inhaler; Inhale 2 puffs into the lungs 2 (two) times daily.  Dispense: 10.2 g; Refill: 0  Luke Shade, MD

## 2023-11-21 ENCOUNTER — Encounter: Payer: Self-pay | Admitting: Internal Medicine

## 2023-11-22 ENCOUNTER — Ambulatory Visit: Admitting: Pulmonary Disease

## 2023-11-22 ENCOUNTER — Ambulatory Visit: Payer: Self-pay | Admitting: Pulmonary Disease

## 2023-11-22 ENCOUNTER — Encounter: Payer: Self-pay | Admitting: Pulmonary Disease

## 2023-11-22 VITALS — BP 124/76 | HR 81 | Temp 97.9°F | Ht 70.0 in | Wt 213.4 lb

## 2023-11-22 DIAGNOSIS — J45901 Unspecified asthma with (acute) exacerbation: Secondary | ICD-10-CM

## 2023-11-22 DIAGNOSIS — U099 Post covid-19 condition, unspecified: Secondary | ICD-10-CM

## 2023-11-22 DIAGNOSIS — G4733 Obstructive sleep apnea (adult) (pediatric): Secondary | ICD-10-CM

## 2023-11-22 DIAGNOSIS — R052 Subacute cough: Secondary | ICD-10-CM | POA: Diagnosis not present

## 2023-11-22 DIAGNOSIS — Z87891 Personal history of nicotine dependence: Secondary | ICD-10-CM

## 2023-11-22 DIAGNOSIS — R0602 Shortness of breath: Secondary | ICD-10-CM | POA: Diagnosis not present

## 2023-11-22 LAB — NITRIC OXIDE: Nitric Oxide: 39

## 2023-11-22 MED ORDER — METHYLPREDNISOLONE 4 MG PO TBPK: ORAL_TABLET | ORAL | 0 refills | Status: DC

## 2023-11-22 MED ORDER — DOXYCYCLINE HYCLATE 100 MG PO TABS: 100.0000 mg | ORAL_TABLET | Freq: Two times a day (BID) | ORAL | 0 refills | Status: AC

## 2023-11-22 NOTE — Telephone Encounter (Signed)
 Noted

## 2023-11-22 NOTE — Telephone Encounter (Signed)
 They are aware

## 2023-11-22 NOTE — Telephone Encounter (Signed)
 Noted. Keep us  posted and let us  know if he needs anything.

## 2023-11-22 NOTE — Patient Instructions (Addendum)
 VISIT SUMMARY:  During your visit, we discussed your recent symptoms of shortness of breath, cough, and wheezing, which have persisted for the past two weeks following a COVID-19 infection. We reviewed your current medications and made some adjustments to better manage your symptoms.  YOUR PLAN:  -ASTHMA EXACERBATION FOLLOWING COVID-19 INFECTION: Your asthma symptoms have worsened following your recent COVID-19 infection, causing increased coughing, shortness of breath, and wheezing. Asthma is a condition where your airways become inflamed and narrow, making it hard to breathe. We will continue your current inhaler (Symbicort ) and add a new medication (Medrol ) to reduce inflammation. You will also have an albuterol  inhaler for severe coughing episodes. We will conduct breathing tests to check your lung function and may consider further testing, such as a CT scan, after your symptoms improve.  -COUGH, SHORTNESS OF BREATH, AND WHEEZING FOLLOWING COVID-19 INFECTION: Your persistent cough, shortness of breath, and wheezing are likely due to lingering inflammation and possible bronchitis changes from your COVID-19 infection. These symptoms can last for several weeks. We will prescribe an antibiotic to address any potential bacterial infection and recommend an over-the-counter medication (Mucinex  DM) to help with your cough. Adjusting the humidity settings on your CPAP machine should help with your dry mouth. We will schedule a follow-up appointment in four weeks, but please contact us  if your symptoms worsen before then.  INSTRUCTIONS:  Please continue using Symbicort  as prescribed, and start the Medrol  and albuterol  inhaler as directed. Take the prescribed antibiotic and Mucinex  DM every 12 hours for your cough. Adjust the humidity settings on your CPAP machine to help with dry mouth. We will see you again in four weeks for a follow-up appointment. If your symptoms worsen before then, contact our clinic  immediately.

## 2023-11-22 NOTE — Progress Notes (Signed)
 Subjective:    Patient ID: Nathaniel Thompson, male    DOB: 01/06/1936, 88 y.o.   MRN: [id number]  Patient Care Team: Glendia Shad, MD as PCP - General (Internal Medicine) Darron Deatrice LABOR, MD as PCP - Cardiology (Cardiology) Kennyth Chew, MD as PCP - Electrophysiology (Clinical Cardiac Electrophysiology) Darron Deatrice LABOR, MD as Consulting Physician (Cardiology) Rennie Cindy JONELLE, MD as Consulting Physician (Hematology and Oncology) Jama, Cordella MATSU, MD (Vascular Surgery) Jane Delmar Pike, NP as Nurse Practitioner (Gastroenterology) Tamea Dedra CROME, MD as Consulting Physician (Pulmonary Disease)  Chief Complaint  Patient presents with   Acute Visit    Cough with shortness of breath. Wheezing.     BACKGROUND/INTERVAL:Patient is an 88 year old with prior history of obstructive sleep apnea and chronic bronchitis previously evaluated here by Dr. Laurance Flor for dyspnea on exertion and sleep apnea, last seen 19 September 2018 by Dr. Flor.  First visit with me on 01 May 2023 for evaluation of a lung nodule which is a granuloma.  Presents today for an ACUTE visit due to cough, shortness of breath and wheezing.  HPI Discussed the use of AI scribe software for clinical note transcription with the patient, who gave verbal consent to proceed.  History of Present Illness   Nathaniel Thompson is an 88 year old male who presents with acute shortness of breath, cough, and wheezing.  He has been experiencing acute shortness of breath, cough, and wheezing for the past two weeks, following a COVID-19 infection approximately five to six weeks ago. The cough is persistent and exhausting, though it does not occur when lying in bed. He also experiences a dry mouth upon waking.  He has been using Symbicort , two puffs twice a day, as prescribed, and previously used another inhaler before switching to Symbicort . He completed a five-day course of prednisone , but the inhaler  has not noticeably improved his symptoms. He has a history of shortness of breath for the past six months to a year, which cardiology is aware of.  He recalls previously bringing up greenish sputum with his cough, but currently, he is not producing any sputum. He has not been prescribed antibiotics recently.  He has no history of asthma previously.  In the past, a spot on his lung was identified as a granuloma, first noted in 1965. A scan in May showed bronchitis changes. He does not have a history of diabetes.      Prior data documented on Dr. Jamel visits: **Desat walk 09/19/18>> at rest on RA, sat was 96% and HR 55, walked 360 feet at fast pace with sat 96% and HR 85. Moderate dyspnea.  **CPAP download 07/10/2018-08/08/2018>> uses greater than 4 hours is 28/30 days.  Average usage on days used is 5 hours 36 minutes.  Pressure ranges 10-20.  Median pressure 12, 95th percentile pressure 15, maximum pressure 17.  Leaks are within normal limits.  Residual AHI is 4.9.  Overall this shows very good compliance with with CPAP and excellent control obstructive sleep apnea. **PFT 09/16/2018>> tracings personally reviewed.  FVC is 98% predicted, FEV1 is 104% predicted, ratio is 76%.  There is no significant improvement with bronchodilator.  TLC is 94% predicted.  Ratio is within normal limits.  Flow volume loop is unremarkable.  DLCO 62% predicted.  Overall this shows normal pulmonary function without evidence of significant COPD/emphysema. **Chest x-ray 04/30/2018>> imaging personally reviewed, mild changes of chronic bronchitis, lungs are otherwise unremarkable.  Nodule in the right upper lung zone. **CPAP  download 03/31/2018-04/29/2018>> raw data personally reviewed.  Usage greater than 4 hours is 20/30 days.  Average usage on days used is 5 hours 11 minutes.  Pressure ranges 5-20.  Median pressure 13, 95th percentile pressure 16, max pressure 17.5.  Leaks are within normal limits.  Residual AHI is 4.   Overall this shows inadequate compliance with CPAP with good control of obstructive sleep apnea when used. **Left heart cath 12/03/2017>> mild stable coronary artery disease,no significant CAD with normal LVEF. **CPAP titration 12/24/2009>>  **Review of old records, previous sleep study 12/02/2009.  AHI of 20, consistent with moderate obstructive sleep apnea.  CPAP was recommended.  DATA 02/12/2023 echocardiogram [facility]): LVEF over 55%, grade 1 DD.  RV appears normal, mild mitral regurgitation, mild pulmonic regurgitation 03/23/2023 CT chest without contrast: No CT evidence of acute intrathoracic abnormality, very mild subpleural reticulation consistent with fibrosis.  Calcified granuloma in the right upper lobe for which no additional imaging follow-up is recommended, cardiomegaly.  Review of Systems A 10 point review of systems was performed and it is as noted above otherwise negative.   Patient Active Problem List   Diagnosis Date Noted   Abnormal chest x-ray 11/16/2023   Abnormal stress test 07/27/2023   Viral URI with cough 03/17/2023   Lung nodule 03/11/2023   Paresthesia of arm 02/17/2023   Healthcare maintenance 08/01/2022   Sinus bradycardia 05/16/2022   VT (ventricular tachycardia) (HCC) - non-sustained 05/16/2022   Pacemaker - MDT 05/16/2022   RSV (acute bronchiolitis due to respiratory syncytial virus) 02/04/2022   Persistent dry cough 01/07/2022   Primary osteoarthritis of left knee 11/11/2021   Shortness of breath    Unstable angina (HCC)    Chronic heart failure with preserved ejection fraction (HCC) 06/23/2020   Foot pain 07/16/2019   Nasal congestion 07/16/2019   B12 deficiency 04/06/2019   Iron  deficiency anemia due to chronic blood loss 03/31/2019   GERD without esophagitis 12/30/2018   History of coronary artery disease 12/30/2018   Popliteal artery aneurysm (HCC) 12/30/2018   Abdominal aortic aneurysm (AAA) (HCC) 12/07/2018   Anemia 12/07/2018   Right hip pain  11/22/2018   Atrial fibrillation (HCC) 11/27/2017   Dizziness 08/12/2017   Nasal erythema 04/10/2017   Carotid artery disease (HCC) 08/06/2016   History of TIA (transient ischemic attack) 07/24/2016   Sleep apnea 07/24/2016   COPD (chronic obstructive pulmonary disease) (HCC) 07/24/2016   CKD (chronic kidney disease) stage 3, GFR 30-59 ml/min (HCC) 07/24/2016   Barrett's esophagus 07/24/2016   Hyperglycemia 07/24/2016   Hypercholesterolemia 07/12/2016   Hypertension, essential 07/12/2016   Coronary artery disease involving native heart with angina pectoris (HCC) 07/12/2016   Prostate cancer (HCC) 11/15/2015   Family history of cancer 11/15/2015   Goals of care, counseling/discussion 06/18/2014   Acute rhinitis 02/12/2014   S/P coronary artery stent placement 10/02/2013   History of skin cancer 09/02/2013    Social History   Tobacco Use   Smoking status: Former    Current packs/day: 0.00    Average packs/day: 1 pack/day for 35.0 years (35.0 ttl pk-yrs)    Types: Cigarettes    Start date: 03/07/1951    Quit date: 03/06/1986    Years since quitting: 37.7   Smokeless tobacco: Former    Types: Chew    Quit date: 1985   Tobacco comments:    Started smoking around 88 yrs old.    Smoked 1 PPD at his heaviest.  Substance Use Topics   Alcohol use: No  Allergies  Allergen Reactions   Atorvastatin Other (See Comments)    Achy joints    Current Meds  Medication Sig   apixaban  (ELIQUIS ) 5 MG TABS tablet Take 1 tablet (5 mg total) by mouth 2 (two) times daily.   budesonide -formoterol  (SYMBICORT ) 80-4.5 MCG/ACT inhaler Inhale 2 puffs into the lungs 2 (two) times daily.   Calcium  Carb-Cholecalciferol  (CALCIUM  + D3 PO) Take 1 tablet by mouth daily.   cholecalciferol  (VITAMIN D3) 25 MCG (1000 UNIT) tablet Take 1,000 Units by mouth daily.   cyanocobalamin  (VITAMIN B12) 1000 MCG tablet Take 1,000 mcg by mouth daily.   doxycycline  (VIBRA -TABS) 100 MG tablet Take 1 tablet (100 mg total)  by mouth 2 (two) times daily for 7 days.   Fe Bisgly-Vit C-Vit B12-FA (GENTLE IRON  PO) Take 28 mg by mouth daily.   furosemide  (LASIX ) 40 MG tablet Take 1 tablet (40 mg total) by mouth every Monday, Wednesday, and Friday.   isosorbide  mononitrate (IMDUR ) 30 MG 24 hr tablet Take 1 tablet (30 mg total) by mouth daily.   Lacosamide (VIMPAT) 100 MG TABS Take 100 mg by mouth in the morning and at bedtime.   lovastatin  (MEVACOR ) 40 MG tablet Take 2 tablets (80 mg total) by mouth at bedtime.   methylPREDNISolone  (MEDROL  DOSEPAK) 4 MG TBPK tablet Take as directed in the package.   metoprolol  succinate (TOPROL -XL) 25 MG 24 hr tablet Take 1 tablet (25 mg total) by mouth daily.   Multiple Vitamin (MULTI-VITAMINS) TABS Take 1 tablet by mouth daily.    pantoprazole  (PROTONIX ) 40 MG tablet Take 40 mg by mouth 2 (two) times daily.     Immunization History  Administered Date(s) Administered   Fluad Quad(high Dose 65+) 11/05/2018, 11/06/2019, 11/17/2020, 11/24/2021   Fluad Trivalent(High Dose 65+) 11/14/2022   INFLUENZA, HIGH DOSE SEASONAL PF 12/26/2012, 12/17/2014, 12/17/2015, 11/28/2016   Influenza, Seasonal, Injecte, Preservative Fre 12/22/2010   Influenza,inj,Quad PF,6+ Mos 12/28/2011, 12/18/2013   Influenza-Unspecified 12/05/2011, 12/05/2015, 11/04/2017   Moderna Covid-19 Vaccine Bivalent Booster 18yrs & up 12/06/2021   PFIZER Comirnaty(Gray Top)Covid-19 Tri-Sucrose Vaccine 04/28/2019, 05/26/2019   PFIZER(Purple Top)SARS-COV-2 Vaccination 03/25/2019, 04/19/2019, 12/11/2019   Pneumococcal Conjugate-13 07/12/2016   Pneumococcal Polysaccharide-23 03/06/2006   Td 09/03/1992   Zoster Recombinant(Shingrix ) 01/07/2019, 03/10/2019        Objective:     BP 124/76   Pulse 81   Temp 97.9 F (36.6 C) (Temporal)   Ht 5' 10 (1.778 m)   Wt 213 lb 6.4 oz (96.8 kg)   SpO2 97%   BMI 30.62 kg/m   SpO2: 97 %  GENERAL: Well-developed, well-nourished elderly gentleman, no acute distress.  Fully  ambulatory. HEAD: Normocephalic, atraumatic.  EYES: Pupils equal, round, reactive to light.  No scleral icterus.  MOUTH: Wears upper dentures, oral mucosa moist.  No thrush NECK: Supple. No thyromegaly. Trachea midline. No JVD.  No adenopathy. PULMONARY: Good air entry bilaterally.  Scattered wheezes throughout. CARDIOVASCULAR: S1 and S2. Regular rate and rhythm.  There is a grade 1/6 mitral regurgitation murmur noted. ABDOMEN: Benign. MUSCULOSKELETAL: No joint deformity, no clubbing, no edema.  NEUROLOGIC: No overt focal deficit, no gait disturbance, speech is fluent. SKIN: Intact,warm,dry. PSYCH: Mood and behavior normal.  Lab Results  Component Value Date   NITRICOXIDE 39 11/22/2023  *Intermediate level of nitric oxide  suggestive of type II inflammation.    Assessment & Plan:     ICD-10-CM   1. Persistent asthma with acute exacerbation, unspecified asthma severity  J45.901     2.  Subacute cough  R05.2     3. Shortness of breath  R06.02 Nitric oxide     4. OSA on CPAP  G47.33       Orders Placed This Encounter  Procedures   Nitric oxide     Meds ordered this encounter  Medications   methylPREDNISolone  (MEDROL  DOSEPAK) 4 MG TBPK tablet    Sig: Take as directed in the package.    Dispense:  21 tablet    Refill:  0   doxycycline  (VIBRA -TABS) 100 MG tablet    Sig: Take 1 tablet (100 mg total) by mouth 2 (two) times daily for 7 days.    Dispense:  14 tablet    Refill:  0   Discussion:    Asthma exacerbation following COVID-19 infection Asthma exacerbation likely triggered by recent COVID-19 infection, presenting with cough, shortness of breath, and wheezing. COVID-19 can cause asthma flare-ups and potential lung scarring. Previous prednisone  course was short; a longer course is planned to reduce airway inflammation. - Continue Symbicort  two puffs twice a day - Prescribe Medrol  (methylprednisolone ) with a tapering schedule - Prescribe albuterol  inhaler for use during  severe coughing episodes - Order breathing tests to assess lung function - Consider further testing, such as a CT scan, after symptoms improve  Cough, shortness of breath, and wheezing following COVID-19 infection Persistent cough, shortness of breath, and wheezing following COVID-19 infection, likely due to residual inflammation and possible bronchitis changes. Symptoms may last 6-8 weeks post-infection. No current antibiotic use; plan to address potential bacterial involvement. Adjustments to CPAP machine humidity settings are recommended to alleviate dry mouth. - Prescribe an antibiotic with anti-inflammatory properties to address potential bacterial infection - Recommend over-the-counter Mucinex  DM (extra strength) every 12 hours for cough - Adjust CPAP machine humidity settings to alleviate dry mouth - Schedule follow-up appointment in four weeks - Advise to contact the clinic if symptoms worsen before the follow-up      Advised if symptoms do not improve or worsen, to please contact office for sooner follow up or seek emergency care.    I spent 42 minutes of dedicated to the care of this patient on the date of this encounter to include pre-visit review of records, face-to-face time with the patient discussing conditions above, post visit ordering of testing, clinical documentation with the electronic health record, making appropriate referrals as documented, and communicating necessary findings to members of the patients care team.     C. Leita Sanders, MD [facility]    *This note was generated using voice recognition software/Dragon and/or AI transcription program.  Despite best efforts to proofread, errors can occur which can change the meaning. Any transcriptional errors that result from this process are unintentional and may not be fully corrected at the time of dictation.

## 2023-11-22 NOTE — Telephone Encounter (Signed)
 Please call Mr/Ms Nathaniel Thompson. Confirm he is doing ok.  I would recommend for him to hold on flu vaccine until he is over this infection. Any problems or other questions, let us  know.

## 2023-11-22 NOTE — Telephone Encounter (Signed)
 Called pt and his wife Gaetana answered she states her husband is doing okay they are agreeable to holding off on his flu shot 11/23/2023 but she can still receive hers. Pt is going to see a pulmonologist today. I will cancel the flu shot for him and keep hers for 11/23/2023

## 2023-11-22 NOTE — Telephone Encounter (Signed)
 FYI Only or Action Required?: FYI only for provider.  Patient is followed in Pulmonology for COPD and OSA, last seen on 11/07/2023 by Nathaniel Devona BIRCH, Nathaniel Thompson.  Called Nurse Triage reporting Cough, Shortness of Breath, and Chest Pain.  Symptoms began several weeks ago.  Interventions attempted: Maintenance inhaler and Increased fluids/rest.  Symptoms are: gradually worsening.  Triage Disposition: See HCP Within 4 Hours (Or PCP Triage)  Patient/caregiver understands and will follow disposition?: Yes     Copied from CRM #[id number]. Topic: Clinical - Red Word Triage >> Nov 22, 2023  8:04 AM Nathaniel Thompson wrote: Nathaniel Thompson that prompted transfer to Nurse Triage: just having severe coughing .coughing his head off inhaler not working Reason for Disposition  [1] MILD difficulty breathing (e.g., minimal/no SOB at rest, SOB with walking, pulse < 100) AND [2] NEW-onset or WORSE than normal  Answer Assessment - Initial Assessment Questions Advised pt be examined in next 4 hours, scheduled earliest available with Nathaniel Thompson today. Kept 9/22 appt on schedule in case was for CPAP, pt wife states she will talk with office today at appt about it.      Pt wife Nathaniel Thompson on line  E2C2 Pulmonary Triage - Initial Assessment Questions Chief Complaint (e.g., cough, sob, wheezing, fever, chills, sweat or additional symptoms) *Go to specific symptom protocol after initial questions. After he coughs so hard just seems to be winded Hurts in his chest sometimes with coughing so hard Lot of heart issues Covid over a month ago, could be from that Cough all the time, this week bad and hard cough Wheezing some No fever, sore throat Been to [facility] twice since covid Had clear chest x-ray last week Does cough up stuff at times clear so far No chest pain today, one day was 4-5/10 Pt hacking cough in background Just drains him when he coughs so much Not too weak to stand No dizziness, nausea, sweating Can't do lot of  walking without getting winded as his normal Not SOB at rest, just more than usual with exertion  How long have symptoms been present? Much worse this past week, seems to have a chronic cough otherwise  MEDICINES:   Have you used any OTC meds to help with symptoms? Yes If yes, ask What medications? Allergy pill at times this week  Have you used your inhalers/maintenance medication? Yes If yes, What medications? Generic for symbicort , doesn't seem to help much No rescue inhaler or nebulizer Sleeps with a CPAP, sleeps pretty good  OXYGEN: Do you wear supplemental oxygen? No  Do you monitor your oxygen levels? Yes If yes, What is your reading (oxygen level) today? Stayed 96-97%  6. CARDIAC HISTORY: Do you have any history of heart disease? (e.g., heart attack, angina, bypass surgery, angioplasty)      Significant Been taking BP because PCP took him off of losartan  last week, last 2 days been 140-145/something little higher than usual  7. LUNG HISTORY: Do you have any history of lung disease?  (e.g., pulmonary embolus, asthma, emphysema)     significant  Protocols used: Breathing Difficulty-A-AH

## 2023-11-23 ENCOUNTER — Ambulatory Visit

## 2023-11-26 ENCOUNTER — Ambulatory Visit: Admitting: Pulmonary Disease

## 2023-11-28 ENCOUNTER — Telehealth: Payer: Self-pay

## 2023-11-28 DIAGNOSIS — R052 Subacute cough: Secondary | ICD-10-CM

## 2023-11-28 DIAGNOSIS — J45901 Unspecified asthma with (acute) exacerbation: Secondary | ICD-10-CM

## 2023-11-28 MED ORDER — PREDNISONE 20 MG PO TABS: 20.0000 mg | ORAL_TABLET | Freq: Every day | ORAL | 0 refills | Status: AC

## 2023-11-28 MED ORDER — BUDESONIDE-FORMOTEROL FUMARATE 160-4.5 MCG/ACT IN AERO: 2.0000 | INHALATION_SPRAY | Freq: Two times a day (BID) | RESPIRATORY_TRACT | 11 refills | Status: DC

## 2023-11-28 NOTE — Telephone Encounter (Signed)
 Copied from CRM #[id number]. Topic: Clinical - Medical Advice >> Nov 28, 2023  9:43 AM Benton KIDD wrote: Reason for CRM: patient wife is calling because good idea to call he saw dr tamea and she put him on meds and he finished the prednisone  he had been doing great yesterday it started again. its not as bad as it was when he saw her . And he is through with all the meds except the antibiotics he will be threw with this tomorrow. Patient wife says he is coughing again but not as bad as it was . Its frequent  [phone] glenda

## 2023-11-28 NOTE — Telephone Encounter (Signed)
 Spoke with Pt's wife and she will pick up the new medications and is grateful to Dr. Tamea for her help. NFN.

## 2023-11-28 NOTE — Addendum Note (Signed)
 Addended by: TAMEA DEDRA CROME on: 11/28/2023 10:24 AM   Modules accepted: Orders

## 2023-11-28 NOTE — Telephone Encounter (Signed)
 I have sent a prescription for the increased dose of Symbicort  160/4.5, he needs to do 2 puffs twice a day and rinse his mouth well after use.  He is to discontinue the lower dose Symbicort .  Sent also 5 more days of prednisone .

## 2023-11-29 ENCOUNTER — Telehealth: Payer: Self-pay

## 2023-11-29 MED ORDER — BUDESONIDE-FORMOTEROL FUMARATE 160-4.5 MCG/ACT IN AERO: 2.0000 | INHALATION_SPRAY | Freq: Two times a day (BID) | RESPIRATORY_TRACT | 11 refills | Status: DC

## 2023-11-29 NOTE — Telephone Encounter (Signed)
 Copied from CRM [id number]. Topic: Clinical - Prescription Issue >> Nov 29, 2023  1:58 PM Devaughn RAMAN wrote: Reason for CRM: Patient wife Gaetana stated [facility] is on back order for budesonide -formoterol  (SYMBICORT ) 160-4.5 MCG/ACT inhaler and it will be for 2-3 weeks. Gaetana also inquired about samples if available for the medication. Gaetana would like a different medication for the patient if possible. Gaetana would like the medication to be sent to [facility] on [address]. The patient has never gotten a medication filled there before and this will be their first time using the pharmacy. Gaetana was thankful and expressed no other needs at this time. Gaetana would like a callback regarding this.

## 2023-11-29 NOTE — Telephone Encounter (Signed)
 Wife sent a mychart message from her account about pt. Please see message below.  Gaetana CHRISTELLA Budge to P [facility] (supporting Allena Hamilton, MD) (Selected Message)     11/28/23  6:03 PM Dedra Sanders prescribed an additional 5 days of prednisone  due to increased cough today. She also prescribed a stronger prescription of the inhaler Symbicort . According to [facility] it is on back order for a few weeks.  Wanted to make her aware of this. Thank you Gaetana Budge

## 2023-11-29 NOTE — Telephone Encounter (Signed)
 Please call Ms Rossa and confirm Mr Worley is doing ok. Thank her for the update.  I do recommend letting pulmonary know that inhaler is on back order or seeing if can get from another pharmacy.

## 2023-11-29 NOTE — Telephone Encounter (Signed)
 He is better each day. She is going to call pulmonary again to see if they can give samples or send to another pharmacy

## 2023-11-29 NOTE — Telephone Encounter (Signed)
 I spoke with the patient's wife (DPR) and I have sent the script to [facility] on [address]  Nothing further needed.

## 2023-12-03 NOTE — Progress Notes (Signed)
 Remote PPM Transmission

## 2023-12-17 ENCOUNTER — Inpatient Hospital Stay: Attending: Internal Medicine

## 2023-12-17 ENCOUNTER — Ambulatory Visit: Admitting: Pulmonary Disease

## 2023-12-17 VITALS — BP 141/81 | HR 76 | Temp 97.6°F | Resp 18

## 2023-12-17 DIAGNOSIS — N183 Chronic kidney disease, stage 3 unspecified: Secondary | ICD-10-CM | POA: Insufficient documentation

## 2023-12-17 DIAGNOSIS — C61 Malignant neoplasm of prostate: Secondary | ICD-10-CM

## 2023-12-17 DIAGNOSIS — D509 Iron deficiency anemia, unspecified: Secondary | ICD-10-CM | POA: Diagnosis present

## 2023-12-17 MED ORDER — IRON SUCROSE 20 MG/ML IV SOLN
200.0000 mg | Freq: Once | INTRAVENOUS | Status: AC
  Administered 2023-12-17: 200 mg via INTRAVENOUS
  Filled 2023-12-17: qty 10

## 2023-12-17 NOTE — Patient Instructions (Signed)

## 2023-12-18 ENCOUNTER — Ambulatory Visit

## 2023-12-18 DIAGNOSIS — Z23 Encounter for immunization: Secondary | ICD-10-CM

## 2023-12-18 NOTE — Progress Notes (Signed)
 Pt received High Dose Flu injection in Left  deltoid muscle. Pt tolerated it well with no complaints or concerns.

## 2023-12-28 ENCOUNTER — Other Ambulatory Visit

## 2023-12-28 DIAGNOSIS — E7800 Pure hypercholesterolemia, unspecified: Secondary | ICD-10-CM | POA: Diagnosis not present

## 2023-12-28 DIAGNOSIS — R739 Hyperglycemia, unspecified: Secondary | ICD-10-CM

## 2023-12-28 DIAGNOSIS — I10 Essential (primary) hypertension: Secondary | ICD-10-CM | POA: Diagnosis not present

## 2023-12-28 LAB — LIPID PANEL
Cholesterol: 129 mg/dL (ref 0–200)
HDL: 35.3 mg/dL — ABNORMAL LOW (ref >39.00)
LDL Cholesterol: 70 mg/dL (ref 0–99)
NonHDL: 93.9
Total CHOL/HDL Ratio: 4
Triglycerides: 121 mg/dL (ref 0.0–149.0)
VLDL: 24.2 mg/dL (ref 0.0–40.0)

## 2023-12-28 LAB — BASIC METABOLIC PANEL WITH GFR
BUN: 19 mg/dL (ref 6–23)
CO2: 27 meq/L (ref 19–32)
Calcium: 9.3 mg/dL (ref 8.4–10.5)
Chloride: 107 meq/L (ref 96–112)
Creatinine, Ser: 1.09 mg/dL (ref 0.40–1.50)
GFR: 61.65 mL/min (ref >60.00)
Glucose, Bld: 98 mg/dL (ref 70–99)
Potassium: 4.6 meq/L (ref 3.5–5.1)
Sodium: 142 meq/L (ref 135–145)

## 2023-12-28 LAB — HEPATIC FUNCTION PANEL
ALT: 11 U/L (ref 0–53)
AST: 15 U/L (ref 0–37)
Albumin: 3.8 g/dL (ref 3.5–5.2)
Alkaline Phosphatase: 42 U/L (ref 39–117)
Bilirubin, Direct: 0.1 mg/dL (ref 0.0–0.3)
Total Bilirubin: 0.4 mg/dL (ref 0.2–1.2)
Total Protein: 5.8 g/dL — ABNORMAL LOW (ref 6.0–8.3)

## 2023-12-28 LAB — HEMOGLOBIN A1C: Hgb A1c MFr Bld: 5.4 % (ref 4.6–6.5)

## 2024-01-01 ENCOUNTER — Ambulatory Visit: Payer: Self-pay | Admitting: Internal Medicine

## 2024-01-02 ENCOUNTER — Emergency Department

## 2024-01-02 ENCOUNTER — Other Ambulatory Visit: Payer: Self-pay

## 2024-01-02 ENCOUNTER — Telehealth: Payer: Self-pay | Admitting: Cardiovascular Disease

## 2024-01-02 ENCOUNTER — Ambulatory Visit: Admitting: Physician Assistant

## 2024-01-02 ENCOUNTER — Encounter: Payer: Self-pay | Admitting: Internal Medicine

## 2024-01-02 ENCOUNTER — Ambulatory Visit: Admitting: Internal Medicine

## 2024-01-02 ENCOUNTER — Emergency Department
Admission: EM | Admit: 2024-01-02 | Discharge: 2024-01-02 | Disposition: A | Discharge status: Home / Self Care | Attending: Emergency Medicine | Admitting: Emergency Medicine

## 2024-01-02 VITALS — BP 100/70 | HR 140 | Temp 98.0°F | Ht 70.0 in | Wt 216.6 lb

## 2024-01-02 DIAGNOSIS — I2510 Atherosclerotic heart disease of native coronary artery without angina pectoris: Secondary | ICD-10-CM | POA: Diagnosis not present

## 2024-01-02 DIAGNOSIS — R739 Hyperglycemia, unspecified: Secondary | ICD-10-CM | POA: Diagnosis not present

## 2024-01-02 DIAGNOSIS — I130 Hypertensive heart and chronic kidney disease with heart failure and stage 1 through stage 4 chronic kidney disease, or unspecified chronic kidney disease: Secondary | ICD-10-CM | POA: Insufficient documentation

## 2024-01-02 DIAGNOSIS — N1831 Chronic kidney disease, stage 3a: Secondary | ICD-10-CM

## 2024-01-02 DIAGNOSIS — I484 Atypical atrial flutter: Secondary | ICD-10-CM

## 2024-01-02 DIAGNOSIS — Z8673 Personal history of transient ischemic attack (TIA), and cerebral infarction without residual deficits: Secondary | ICD-10-CM

## 2024-01-02 DIAGNOSIS — I6523 Occlusion and stenosis of bilateral carotid arteries: Secondary | ICD-10-CM

## 2024-01-02 DIAGNOSIS — Z85828 Personal history of other malignant neoplasm of skin: Secondary | ICD-10-CM | POA: Diagnosis not present

## 2024-01-02 DIAGNOSIS — N183 Chronic kidney disease, stage 3 unspecified: Secondary | ICD-10-CM | POA: Diagnosis not present

## 2024-01-02 DIAGNOSIS — Z950 Presence of cardiac pacemaker: Secondary | ICD-10-CM | POA: Insufficient documentation

## 2024-01-02 DIAGNOSIS — I4891 Unspecified atrial fibrillation: Secondary | ICD-10-CM

## 2024-01-02 DIAGNOSIS — D649 Anemia, unspecified: Secondary | ICD-10-CM

## 2024-01-02 DIAGNOSIS — R000 Tachycardia, unspecified: Secondary | ICD-10-CM | POA: Diagnosis not present

## 2024-01-02 DIAGNOSIS — I10 Essential (primary) hypertension: Secondary | ICD-10-CM

## 2024-01-02 DIAGNOSIS — I5032 Chronic diastolic (congestive) heart failure: Secondary | ICD-10-CM

## 2024-01-02 DIAGNOSIS — G4730 Sleep apnea, unspecified: Secondary | ICD-10-CM

## 2024-01-02 DIAGNOSIS — J449 Chronic obstructive pulmonary disease, unspecified: Secondary | ICD-10-CM

## 2024-01-02 DIAGNOSIS — Z85830 Personal history of malignant neoplasm of bone: Secondary | ICD-10-CM | POA: Insufficient documentation

## 2024-01-02 DIAGNOSIS — I4892 Unspecified atrial flutter: Secondary | ICD-10-CM | POA: Insufficient documentation

## 2024-01-02 DIAGNOSIS — Z8546 Personal history of malignant neoplasm of prostate: Secondary | ICD-10-CM | POA: Diagnosis not present

## 2024-01-02 DIAGNOSIS — R911 Solitary pulmonary nodule: Secondary | ICD-10-CM

## 2024-01-02 DIAGNOSIS — I724 Aneurysm of artery of lower extremity: Secondary | ICD-10-CM

## 2024-01-02 DIAGNOSIS — C61 Malignant neoplasm of prostate: Secondary | ICD-10-CM

## 2024-01-02 DIAGNOSIS — I5030 Unspecified diastolic (congestive) heart failure: Secondary | ICD-10-CM | POA: Diagnosis not present

## 2024-01-02 DIAGNOSIS — I714 Abdominal aortic aneurysm, without rupture, unspecified: Secondary | ICD-10-CM

## 2024-01-02 DIAGNOSIS — K2270 Barrett's esophagus without dysplasia: Secondary | ICD-10-CM

## 2024-01-02 DIAGNOSIS — E7800 Pure hypercholesterolemia, unspecified: Secondary | ICD-10-CM | POA: Diagnosis not present

## 2024-01-02 DIAGNOSIS — R072 Precordial pain: Secondary | ICD-10-CM | POA: Diagnosis present

## 2024-01-02 DIAGNOSIS — I25119 Atherosclerotic heart disease of native coronary artery with unspecified angina pectoris: Secondary | ICD-10-CM

## 2024-01-02 LAB — URINALYSIS, W/ REFLEX TO CULTURE (INFECTION SUSPECTED)
Bacteria, UA: NONE SEEN
Bilirubin Urine: NEGATIVE
Glucose, UA: NEGATIVE mg/dL
Hgb urine dipstick: NEGATIVE
Ketones, ur: NEGATIVE mg/dL
Leukocytes,Ua: NEGATIVE
Nitrite: NEGATIVE
Protein, ur: NEGATIVE mg/dL
Specific Gravity, Urine: 1.002 — ABNORMAL LOW (ref 1.005–1.030)
Squamous Epithelial / HPF: 0 /HPF (ref 0–5)
pH: 5 (ref 5.0–8.0)

## 2024-01-02 LAB — CBC
HCT: 38.7 % — ABNORMAL LOW (ref 39.0–52.0)
Hemoglobin: 12.7 g/dL — ABNORMAL LOW (ref 13.0–17.0)
MCH: 30.8 pg (ref 26.0–34.0)
MCHC: 32.8 g/dL (ref 30.0–36.0)
MCV: 93.7 fL (ref 80.0–100.0)
Platelets: 196 K/uL (ref 150–400)
RBC: 4.13 MIL/uL — ABNORMAL LOW (ref 4.22–5.81)
RDW: 14.5 % (ref 11.5–15.5)
WBC: 5.9 K/uL (ref 4.0–10.5)
nRBC: 0 % (ref 0.0–0.2)

## 2024-01-02 LAB — BASIC METABOLIC PANEL WITH GFR
Anion gap: 10 (ref 5–15)
BUN: 21 mg/dL (ref 8–23)
CO2: 22 mmol/L (ref 22–32)
Calcium: 9.3 mg/dL (ref 8.9–10.3)
Chloride: 107 mmol/L (ref 98–111)
Creatinine, Ser: 1.32 mg/dL — ABNORMAL HIGH (ref 0.61–1.24)
GFR, Estimated: 53 mL/min — ABNORMAL LOW (ref >60)
Glucose, Bld: 97 mg/dL (ref 70–99)
Potassium: 4.1 mmol/L (ref 3.5–5.1)
Sodium: 139 mmol/L (ref 135–145)

## 2024-01-02 LAB — TROPONIN I (HIGH SENSITIVITY)
Troponin I (High Sensitivity): 22 ng/L — ABNORMAL HIGH (ref <18)
Troponin I (High Sensitivity): 25 ng/L — ABNORMAL HIGH (ref <18)

## 2024-01-02 LAB — MAGNESIUM: Magnesium: 2 mg/dL (ref 1.7–2.4)

## 2024-01-02 LAB — PHOSPHORUS: Phosphorus: 3.4 mg/dL (ref 2.5–4.6)

## 2024-01-02 MED ORDER — METOPROLOL TARTRATE 25 MG PO TABS
25.0000 mg | ORAL_TABLET | Freq: Once | ORAL | Status: AC
  Administered 2024-01-02: 25 mg via ORAL

## 2024-01-02 MED ORDER — METOPROLOL TARTRATE 5 MG/5ML IV SOLN
10.0000 mg | Freq: Once | INTRAVENOUS | Status: AC
  Administered 2024-01-02: 10 mg via INTRAVENOUS
  Filled 2024-01-02: qty 10

## 2024-01-02 MED ORDER — MAGNESIUM SULFATE 2 GM/50ML IV SOLN
2.0000 g | INTRAVENOUS | Status: AC
  Administered 2024-01-02: 2 g via INTRAVENOUS
  Filled 2024-01-02: qty 50

## 2024-01-02 MED ORDER — ETOMIDATE 2 MG/ML IV SOLN
0.3000 mg/kg | Freq: Once | INTRAVENOUS | Status: DC
  Filled 2024-01-02: qty 20

## 2024-01-02 MED ORDER — ETOMIDATE 2 MG/ML IV SOLN
INTRAVENOUS | Status: AC | PRN
  Administered 2024-01-02: 20 mg via INTRAVENOUS

## 2024-01-02 NOTE — ED Triage Notes (Signed)
 Pt to ED [facility] from [place] for chest pain. A fib 140s, 25mg  PO metoprolol  given PTA, hypertensive.

## 2024-01-02 NOTE — Sedation Documentation (Signed)
 ED Provider at bedside.

## 2024-01-02 NOTE — ED Provider Notes (Signed)
 [facility] Provider Note    Event Date/Time   First MD Initiated Contact with Patient 01/02/24 1143     (approximate)   History   Chief Complaint: Chest Pain   HPI  Nathaniel Thompson is a 88 y.o. male with a history of heart failure, atrial flutter, COPD, CKD who comes to ED due to tachycardia associated with central chest tightness, fatigue, occurred this morning while in his doctor's office.  He was in his usual state of health prior to entering the office and while initially checking in.  When he became symptomatic, they noted on EKG that he was in A-fib with a heart rate in the 140s.  He had taken all of his medications this morning, and he was given additional 25 mg of oral metoprolol  in the doctor's office without improvement so he was sent to the ED.  Patient denies recent illness.  Has been compliant with his medications, eating and drinking normally.        Past Medical History:  Diagnosis Date   (HFpEF) heart failure with preserved ejection fraction (HCC)    a. 05/2018 Echo: Nl EF, Gr1 DD, sev dil LA, mild MR/TR, mild PAH; b. 05/2019 Echo: EF 55-60%, no rwma, mild LVH. Nl PASP. Mildly dil LA. Triv MR. Mild AoV sclerosis w/o stenosis. Mildly dil Asc AO (39mm); d. 02/2023 Echo [facility]): EF >55%, nl RV fxn, mild PR.   AAA (abdominal aortic aneurysm)    Allergic rhinitis    Barrett's esophagus 10/21/2013   Bone cancer (HCC)    Cardiac pacemaker in situ    CKD (chronic kidney disease), stage III (HCC)    Colon polyp, hyperplastic    COPD (chronic obstructive pulmonary disease) (HCC)    mild COPD. former smoker   Coronary artery disease    a. 2003 s/p PCI ([facility]); b. 2004 s/p PCI x 2 ([facility]); c. 11/2017 Cath: LM nl, LAD mild dzs, LCX patent stent w/ 30% distal edge restenosis, RCA patent distal stent w/ 30% prox edge stenosis. Nl EF->Med Rx; d. 02/2021 Cath: LM nl, LAD min irregs, D1/2 min irregs, LCX 10p ISR, 21m, RCA 14m/d ISR, EF 65%; e. 07/2023 PET Stress:  antlat and inflat ischemia.   Diverticulosis    Fundic gland polyps of stomach, benign    Gastritis    GERD (gastroesophageal reflux disease)    Hypercholesteremia    Hypertension    PAF (paroxysmal atrial fibrillation) (HCC)    a. CHA2DS2VASc = 7-->eliquis .   Prostate cancer (HCC)    Prostatism    Pulmonary embolism (HCC)    a. 2023 - lobar and segmental PE in setting of RSV.   Reflux esophagitis    Renal stones    Skin cancer    left cheek/lesion excised   Sleep apnea    Symptomatic bradycardia    Tachy-brady syndrome (HCC)    a. 03/2021 s/p MDT MRI compatible DC PPM (ser # [id number]).   Tachycardia    a. 11/2017 admit w/ tachycardia/? afib-->no afib noted upon review (amio/OAC d/c'd).   TIA (transient ischemic attack)    a. 02/2023 admit [facility] - no stroke on MRI; b. 06/2022 EEG: nl for age.    Current Outpatient Rx   Order #: [id number] Class: Sample   Order #: [id number] Class: Normal   Order #: [id number] Class: Historical Med   Order #: [id number] Class: Historical Med   Order #: [id number] Class: Historical Med   Order #: [id number] Class: Historical Med   Order #: [id number] Class:  Normal   Order #: [id number] Class: Normal   Order #: [id number] Class: Historical Med   Order #: [id number] Class: Normal   Order #: [id number] Class: Normal   Order #: [id number] Class: Normal   Order #: [id number] Class: Historical Med   Order #: [id number] Class: Historical Med    Past Surgical History:  Procedure Laterality Date   CARDIAC CATHETERIZATION     COLONOSCOPY WITH PROPOFOL  N/A 09/13/2015   Procedure: COLONOSCOPY WITH PROPOFOL ;  Surgeon: Gladis RAYMOND Mariner, MD;  Location: [facility] ENDOSCOPY;  Service: Endoscopy;  Laterality: N/A;   COLONOSCOPY WITH PROPOFOL  N/A 05/07/2019   Procedure: COLONOSCOPY WITH PROPOFOL ;  Surgeon: Dessa Reyes ORN, MD;  Location: [facility] ENDOSCOPY;  Service: Endoscopy;  Laterality: N/A;   CORONARY ANGIOPLASTY     ESOPHAGOGASTRODUODENOSCOPY (EGD) WITH PROPOFOL  N/A 09/13/2015    Procedure: ESOPHAGOGASTRODUODENOSCOPY (EGD) WITH PROPOFOL ;  Surgeon: Gladis RAYMOND Mariner, MD;  Location: [facility] ENDOSCOPY;  Service: Endoscopy;  Laterality: N/A;   ESOPHAGOGASTRODUODENOSCOPY (EGD) WITH PROPOFOL  N/A 12/28/2015   Procedure: ESOPHAGOGASTRODUODENOSCOPY (EGD) WITH PROPOFOL ;  Surgeon: Gladis RAYMOND Mariner, MD;  Location: [facility] ENDOSCOPY;  Service: Endoscopy;  Laterality: N/A;   ESOPHAGOGASTRODUODENOSCOPY (EGD) WITH PROPOFOL  N/A 06/16/2016   Procedure: ESOPHAGOGASTRODUODENOSCOPY (EGD) WITH PROPOFOL ;  Surgeon: Gladis RAYMOND Mariner, MD;  Location: [facility] ENDOSCOPY;  Service: Endoscopy;  Laterality: N/A;   ESOPHAGOGASTRODUODENOSCOPY (EGD) WITH PROPOFOL  N/A 05/07/2019   Procedure: ESOPHAGOGASTRODUODENOSCOPY (EGD) WITH PROPOFOL ;  Surgeon: Dessa Reyes ORN, MD;  Location: [facility] ENDOSCOPY;  Service: Endoscopy;  Laterality: N/A;   EYE SURGERY  2013   CATARACT EXTRACTION   GANGLION CYST EXCISION Right 05/18/2017   Procedure: REMOVAL GANGLION CYST ANKLE;  Surgeon: Ashley Soulier, DPM;  Location: [facility] ORS;  Service: Podiatry;  Laterality: Right;   heart cath stent     LEFT HEART CATH AND CORONARY ANGIOGRAPHY Right 12/03/2017   Procedure: Left Heart Cath and Coronary Angiography with possible coronary intervention;  Surgeon: Fernand Denyse LABOR, MD;  Location: [facility] INVASIVE CV LAB;  Service: Cardiovascular;  Laterality: Right;   LEFT HEART CATH AND CORONARY ANGIOGRAPHY Left 07/27/2023   Procedure: LEFT HEART CATH AND CORONARY ANGIOGRAPHY;  Surgeon: Darron Deatrice LABOR, MD;  Location: [facility] INVASIVE CV LAB;  Service: Cardiovascular;  Laterality: Left;   PACEMAKER IMPLANT N/A 04/04/2021   Procedure: PACEMAKER IMPLANT;  Surgeon: Fernande Elspeth BROCKS, MD;  Location: [facility] INVASIVE CV LAB;  Service: Cardiovascular;  Laterality: N/A;   PROSTATE BIOPSY     RIGHT/LEFT HEART CATH AND CORONARY ANGIOGRAPHY N/A 02/07/2021   Procedure: RIGHT/LEFT HEART CATH AND CORONARY ANGIOGRAPHY;  Surgeon: Darron Deatrice LABOR, MD;  Location: [facility] INVASIVE CV  LAB;  Service: Cardiovascular;  Laterality: N/A;   stents     multiple    Physical Exam   Triage Vital Signs: ED Triage Vitals  Encounter Vitals Group     BP 01/02/24 1149 (!) 135/105     Girls Systolic BP Percentile --      Girls Diastolic BP Percentile --      Boys Systolic BP Percentile --      Boys Diastolic BP Percentile --      Pulse Rate 01/02/24 1149 (!) 135     Resp 01/02/24 1149 (!) 21     Temp 01/02/24 1149 97.9 F (36.6 C)     Temp src --      SpO2 01/02/24 1149 100 %     Weight 01/02/24 1146 214 lb 11.2 oz (97.4 kg)     Height 01/02/24 1146 5' 10 (1.778 m)     Head Circumference --  Peak Flow --      Pain Score 01/02/24 1147 0     Pain Loc --      Pain Education --      Exclude from Growth Chart --     Most recent vital signs: Vitals:   01/02/24 1356 01/02/24 1401  BP: (!) 135/96 (!) 133/91  Pulse: 77 76  Resp: (!) 25 (!) 25  Temp:    SpO2: 100% 100%    General: Awake, no distress.  CV:  Good peripheral perfusion.  Tachycardia, heart rate 130.  Symmetric distal pulses Resp:  Normal effort.  Clear to auscultation bilaterally Abd:  No distention.  Soft nontender Other:  No significant edema.   ED Results / Procedures / Treatments   Labs (all labs ordered are listed, but only abnormal results are displayed) Labs Reviewed  BASIC METABOLIC PANEL WITH GFR - Abnormal; Notable for the following components:      Result Value   Creatinine, Ser 1.32 (*)    GFR, Estimated 53 (*)    All other components within normal limits  CBC - Abnormal; Notable for the following components:   RBC 4.13 (*)    Hemoglobin 12.7 (*)    HCT 38.7 (*)    All other components within normal limits  URINALYSIS, W/ REFLEX TO CULTURE (INFECTION SUSPECTED) - Abnormal; Notable for the following components:   Color, Urine COLORLESS (*)    APPearance CLEAR (*)    Specific Gravity, Urine 1.002 (*)    All other components within normal limits  TROPONIN I (HIGH SENSITIVITY) -  Abnormal; Notable for the following components:   Troponin I (High Sensitivity) 22 (*)    All other components within normal limits  TROPONIN I (HIGH SENSITIVITY) - Abnormal; Notable for the following components:   Troponin I (High Sensitivity) 25 (*)    All other components within normal limits  MAGNESIUM  PHOSPHORUS     EKG Interpreted by me Atrial flutter, rate of 135.  Normal axis, prolonged QTc of 540 ms.  No acute ischemic changes.   RADIOLOGY Chest x-ray interpreted by me, unremarkable.  Radiology report reviewed   PROCEDURES:  .Critical Care  Performed by: Viviann Pastor, MD Authorized by: Viviann Pastor, MD   Critical care provider statement:    Critical care time (minutes):  35   Critical care time was exclusive of:  Separately billable procedures and treating other patients   Critical care was necessary to treat or prevent imminent or life-threatening deterioration of the following conditions:  Cardiac failure and circulatory failure   Critical care was time spent personally by me on the following activities:  Development of treatment plan with patient or surrogate, discussions with consultants, evaluation of patient's response to treatment, examination of patient, obtaining history from patient or surrogate, ordering and performing treatments and interventions, ordering and review of laboratory studies, ordering and review of radiographic studies, pulse oximetry, re-evaluation of patient's condition and review of old charts Comments:        .Cardioversion  Date/Time: 01/02/2024 3:09 PM  Performed by: Viviann Pastor, MD Authorized by: Viviann Pastor, MD   Consent:    Consent obtained:  Verbal and written   Consent given by:  Patient   Risks discussed:  Cutaneous burn, induced arrhythmia, death and pain Pre-procedure details:    Cardioversion basis:  Elective   Rhythm:  Atrial flutter   Electrode placement:  Anterior-posterior Patient sedated:  Yes. Refer to sedation procedure documentation for details of sedation.  Attempt one:    Cardioversion mode:  Synchronous   Waveform:  Biphasic   Shock (Joules):  200   Shock outcome:  Conversion to normal sinus rhythm Post-procedure details:    Patient status:  Awake   Patient tolerance of procedure:  Tolerated well, no immediate complications Comments:         .Sedation  Date/Time: 01/02/2024 3:15 PM  Performed by: Viviann Pastor, MD Authorized by: Viviann Pastor, MD   Consent:    Consent obtained:  Verbal and written   Consent given by:  Patient   Risks discussed:  Allergic reaction, inadequate sedation, nausea, vomiting and respiratory compromise necessitating ventilatory assistance and intubation   Alternatives discussed:  Analgesia without sedation Universal protocol:    Immediately prior to procedure, a time out was called: yes     Patient identity confirmed:  Arm band and verbally with patient Indications:    Procedure performed:  Cardioversion   Procedure necessitating sedation performed by:  Physician performing sedation Pre-sedation assessment:    Time since last food or drink:  6h   ASA classification: class 3 - patient with severe systemic disease     Mouth opening:  3 or more finger widths   Thyromental distance:  4 finger widths   Mallampati score:  I - soft palate, uvula, fauces, pillars visible   Neck mobility: normal     Pre-sedation assessments completed and reviewed: airway patency, cardiovascular function, hydration status, mental status, nausea/vomiting, pain level, respiratory function and temperature   A pre-sedation assessment was completed prior to the start of the procedure Immediate pre-procedure details:    Reassessment: Patient reassessed immediately prior to procedure     Reviewed: vital signs, relevant labs/tests and NPO status     Verified: bag valve mask available, emergency equipment available, intubation equipment available, IV  patency confirmed, oxygen available, reversal medications available and suction available   Procedure details (see MAR for exact dosages):    Preoxygenation:  Nasal cannula   Sedation:  Etomidate   Intended level of sedation: deep   Analgesia:  None   Intra-procedure monitoring:  Blood pressure monitoring, cardiac monitor, continuous pulse oximetry, continuous capnometry, frequent LOC assessments and frequent vital sign checks   Intra-procedure events: none     Intra-procedure management:  Airway repositioning   Total Provider sedation time (minutes):  20 Post-procedure details:   A post-sedation assessment was completed following the completion of the procedure.   Attendance: Constant attendance by certified staff until patient recovered     Recovery: Patient returned to pre-procedure baseline     Post-sedation assessments completed and reviewed: airway patency, cardiovascular function, hydration status, mental status, nausea/vomiting, pain level and respiratory function     Patient is stable for discharge or admission: yes     Procedure completion:  Tolerated well, no immediate complications Comments:     Appears to have OSA when sedated. Jaw thrust to open OP airway helped maintain good effortless respirations.    MEDICATIONS ORDERED IN ED: Medications  etomidate (AMIDATE) injection 29.22 mg (29.22 mg Intravenous Not Given 01/02/24 1422)  metoprolol  tartrate (LOPRESSOR ) injection 10 mg (10 mg Intravenous Given 01/02/24 1217)  magnesium sulfate IVPB 2 g 50 mL (0 g Intravenous Stopped 01/02/24 1421)  etomidate (AMIDATE) injection (20 mg Intravenous Given 01/02/24 1338)     IMPRESSION / MDM / ASSESSMENT AND PLAN / ED COURSE  I reviewed the triage vital signs and the nursing notes.  DDx: Electrolyte derangement, AKI, anemia, non-STEMI, UTI, urinary  retention, paroxysmal atrial flutter  Patient's presentation is most consistent with acute presentation with potential threat to life  or bodily function.     Clinical Course as of 01/02/24 1518  Wed Jan 02, 2024  1213 Patient presents with A-fib RVR with associated chest pain.  Suspect angina related to high level of myocardial oxygen demand with tachycardia.  Will give metoprolol , check labs [PS]  1228 After 10 mg IV metoprolol , heart rate is ranging from 85-105.  Blood pressure remained stable.  Clearly atrial flutter on repeat EKG. [PS]  1333 Still in atrial flutter, blood pressure remains stable, but has persistent symptoms.  He has been compliant with long-term anticoagulation, has previously had ECV, will perform ECV in ED [PS]  1405 ECV completed, converted to NSR on 1 attempt. [PS]    Clinical Course User Index [PS] Viviann Pastor, MD    ----------------------------------------- 3:09 PM on 01/02/2024 ----------------------------------------- Symptoms resolved.  Serial troponins flat.  Back to baseline and stable for discharge.   FINAL CLINICAL IMPRESSION(S) / ED DIAGNOSES   Final diagnoses:  Atypical atrial flutter (HCC)     Rx / DC Orders   ED Discharge Orders     None        Note:  This document was prepared using Dragon voice recognition software and may include unintentional dictation errors.   Viviann Pastor, MD 01/02/24 [phone]

## 2024-01-02 NOTE — Telephone Encounter (Signed)
 Discussed case with Dr. Glendia.  Patient has subsequently been transported to the ED from PCP office.

## 2024-01-02 NOTE — Discharge Instructions (Addendum)
 Your lab tests were all okay.  Continue taking all of your medications as usual and follow-up with your doctor.

## 2024-01-02 NOTE — Progress Notes (Deleted)
 Cardiology Office Note    Date:  01/02/2024   ID:  PRASHANT GLOSSER, DOB [birth year], MRN [id number]  PCP:  Glendia Shad, MD  Cardiologist:  Deatrice Cage, MD  Electrophysiologist:  Fonda Kitty, MD   Chief Complaint: ***  History of Present Illness:   Nathaniel Thompson is a 88 y.o. male with history of CAD s/p multiple PCI's as outlined below, symptomatic bradycardia s/p PPM 03/2021, PAF/flutter on apixaban , HFpEF, TIA, static prostate cancer, AAA followed by vascular surgery, CKD stage III, provoked PE 2023, hypertension, and hyperlipidemia who presents for follow-up on***.    Patient has known history of CAD s/p stenting to the LCx and RCA.  He had PCI done in 2003 at [facility].  He had repeat PCI x 2 at [facility] in 2004.  He was admitted to the hospital 11/2017 with questionable tachycardia/atrial fibrillation.  Initially, he was placed on amiodarone  and anticoagulation but his EKGs were reviewed and revealed sinus rhythm at that time and both were discontinued.  Echo 05/2018 showed normal LV systolic function and G1 DD with severely dilated left atrium, mild MR and mild TR with mild pulmonary hypertension.  In 02/2021, he had worsening shortness of breath and chest pain.  LHC showed patent stents in the LCx and RCA with no obstructive CAD.  RHC showed mildly elevated filling pressures, mild pulmonary hypertension, and normal cardiac output.  During the procedure he was noted to have intermittent bradycardia with heart rate in the low 40s bpm.  Subsequent outpatient monitor showed frequent episodes of SVT.  He was referred to EP and underwent pacemaker implantation 03/2021.  In late 2023, he fell from a deer stand and fractured his left posterior calcaneus.  He was subsequently admitted to [facility] for shortness of breath, cough, and fever in the setting of displaced fracture of the posterior calcaneus.  He was found to have RSV and lobar and segmental pulmonary embolism.  He was also noted to have  episodes of tachycardia and found to be in atrial fibrillation with RVR that lasted for about 45 minutes.  He was placed on anticoagulation and metoprolol .  He was admitted to [facility] 02/2019 with slurred speech, right arm weakness, and suspected stroke.  tPA was not given (on Eliquis ).  He was hypertensive on presentation.  MRI of the brain showed no acute stroke.  Echo showed normal LV systolic function with no valvular abnormalities.  An episode was thought to be TIA.  Patient was seen 07/2023 with increased shortness of breath and decreased exercise tolerance.  Myocardial PET showed anterolateral and inferior lateral ischemia.  Subsequent LHC 07/2023 showed patent RCA and LCx stents with minimal restenosis.  There is mild nonobstructive disease overall.  Echo showed normal LV systolic function with G1 DD and mild mitral regurg.  He was seen in office 09/2023 reporting 3 to 4-day history of fatigue and tachycardic rates.  He was found to be in atrial flutter with 2-1 AV block.  He had been adherent to pharmacotherapy including anticoagulation.  ATP was attempted in the clinic without success and he was transferred to the ED.  Workup in the ER showed nonacute chest x-ray and normal troponin.  While preparing for DCCV, he spontaneously converted to sinus rhythm, though he redeveloped atrial flutter after about 10 to 15 minutes with rates in the 130s bpm and underwent successful DCCV.   Patient was most recently seen in our office 10/02/2023 overall doing well without angina or cardiac decompensation.  He  noted a several month history of intermittent shortness of breath typically occurring with exertion but unchanged over the past several months.  He noted mild bilateral lower extremity swelling he attributed to orthopedic issues.  He felt that his abdomen was a little more swollen.  He was directed to take furosemide  40 mg daily for the next 4 days followed by transitioning to furosemide  40 mg on Mondays, Wednesdays,  and Fridays.  Patient was most recently seen by EP 10/31/2023 overall doing okay.  He had recently been diagnosed with COVID 2 weeks prior.  He reported some bleeding with bowel movements due to hemorrhoids which was not new or worsening.  There was discussion of initiation of antiarrhythmic therapy though it was decided to continue monitoring at that time given low burden of atrial fibrillation.  Noted several A-fib episodes happening in the overnight hours and patient reported compliance with CPAP although he was unsure who was managing CPAP settings.  He was directed to discuss with PCP.  ***  Labs independently reviewed: 12/2023-TC 129, TG 121, HDL 35, LDL 70, A1c 5.4, sodium 142, potassium 4.6, BUN 19, creatinine 1.09, normal LFTs  Objective   Past Medical History:  Diagnosis Date   (HFpEF) heart failure with preserved ejection fraction (HCC)    a. 05/2018 Echo: Nl EF, Gr1 DD, sev dil LA, mild MR/TR, mild PAH; b. 05/2019 Echo: EF 55-60%, no rwma, mild LVH. Nl PASP. Mildly dil LA. Triv MR. Mild AoV sclerosis w/o stenosis. Mildly dil Asc AO (39mm); d. 02/2023 Echo [facility]): EF >55%, nl RV fxn, mild PR.   AAA (abdominal aortic aneurysm)    Allergic rhinitis    Barrett's esophagus 10/21/2013   Bone cancer (HCC)    Cardiac pacemaker in situ    CKD (chronic kidney disease), stage III (HCC)    Colon polyp, hyperplastic    COPD (chronic obstructive pulmonary disease) (HCC)    mild COPD. former smoker   Coronary artery disease    a. 2003 s/p PCI ([facility]); b. 2004 s/p PCI x 2 ([facility]); c. 11/2017 Cath: LM nl, LAD mild dzs, LCX patent stent w/ 30% distal edge restenosis, RCA patent distal stent w/ 30% prox edge stenosis. Nl EF->Med Rx; d. 02/2021 Cath: LM nl, LAD min irregs, D1/2 min irregs, LCX 10p ISR, 57m, RCA 50m/d ISR, EF 65%; e. 07/2023 PET Stress: antlat and inflat ischemia.   Diverticulosis    Fundic gland polyps of stomach, benign    Gastritis    GERD (gastroesophageal reflux disease)     Hypercholesteremia    Hypertension    PAF (paroxysmal atrial fibrillation) (HCC)    a. CHA2DS2VASc = 7-->eliquis .   Prostate cancer (HCC)    Prostatism    Pulmonary embolism (HCC)    a. 2023 - lobar and segmental PE in setting of RSV.   Reflux esophagitis    Renal stones    Skin cancer    left cheek/lesion excised   Sleep apnea    Symptomatic bradycardia    Tachy-brady syndrome (HCC)    a. 03/2021 s/p MDT MRI compatible DC PPM (ser # [id number]).   Tachycardia    a. 11/2017 admit w/ tachycardia/? afib-->no afib noted upon review (amio/OAC d/c'd).   TIA (transient ischemic attack)    a. 02/2023 admit [facility] - no stroke on MRI; b. 06/2022 EEG: nl for age.    Current Medications: No outpatient medications have been marked as taking for the 01/02/24 encounter (Appointment) with Abigail Bernardino HERO, PA-C.  Allergies:   Atorvastatin   Social History   Socioeconomic History   Marital status: Married    Spouse name: Glenda   Number of children: 2   Years of education: Not on file   Highest education level: Not on file  Occupational History   Occupation: retired production designer, theatre/television/film for [employer]  Tobacco Use   Smoking status: Former    Current packs/day: 0.00    Average packs/day: 1 pack/day for 35.0 years (35.0 ttl pk-yrs)    Types: Cigarettes    Start date: 03/07/1951    Quit date: 03/06/1986    Years since quitting: 37.8   Smokeless tobacco: Former    Types: Chew    Quit date: 1985   Tobacco comments:    Started smoking around 88 yrs old.    Smoked 1 PPD at his heaviest.  Vaping Use   Vaping status: Never Used  Substance and Sexual Activity   Alcohol use: No   Drug use: No   Sexual activity: Not Currently  Other Topics Concern   Not on file  Social History Narrative   Married, Gaetana 2 step children and 2 children from previous marriage   Social Drivers of Corporate Investment Banker Strain: Low Risk  (10/31/2023)   Overall Financial Resource Strain (CARDIA)    Difficulty of  Paying Living Expenses: Not hard at all  Food Insecurity: No Food Insecurity (10/31/2023)   Hunger Vital Sign    Worried About Running Out of Food in the Last Year: Never true    Ran Out of Food in the Last Year: Never true  Transportation Needs: No Transportation Needs (10/31/2023)   PRAPARE - Administrator, Civil Service (Medical): No    Lack of Transportation (Non-Medical): No  Physical Activity: Insufficiently Active (10/31/2023)   Exercise Vital Sign    Days of Exercise per Week: 4 days    Minutes of Exercise per Session: 10 min  Stress: No Stress Concern Present (10/31/2023)   [employer] of Occupational Health - Occupational Stress Questionnaire    Feeling of Stress: Not at all  Social Connections: Socially Integrated (10/31/2023)   Social Connection and Isolation Panel    Frequency of Communication with Friends and Family: More than three times a week    Frequency of Social Gatherings with Friends and Family: More than three times a week    Attends Religious Services: More than 4 times per year    Active Member of [employer] or Organizations: Yes    Attends Engineer, Structural: More than 4 times per year    Marital Status: Married     Family History:  The patient's family history includes Cancer in his brother, brother, daughter, father, mother, sister, and another family member; Stroke in his father.  ROS:   12-point review of systems is negative unless otherwise noted in the HPI.  EKGs/Other Studies Reviewed:    Studies reviewed were summarized above. The additional studies were reviewed today:  2D echo 08/10/2023: 1. Left ventricular ejection fraction, by estimation, is 55 to 60%. The  left ventricle has normal function. The left ventricle has no regional  wall motion abnormalities. Left ventricular diastolic parameters are  consistent with Grade I diastolic  dysfunction (impaired relaxation).   2. Right ventricular systolic function is normal. The  right ventricular  size is normal. Tricuspid regurgitation signal is inadequate for assessing  PA pressure.   3. Right atrial size was mildly dilated.   4. The  mitral valve is degenerative. Mild mitral valve regurgitation. No  evidence of mitral stenosis.   5. The aortic valve was not well visualized. Aortic valve regurgitation  is not visualized. No aortic stenosis is present.    LHC 07/27/2023:   Prox Cx to Mid Cx lesion is 10% stenosed.   Mid Cx lesion is 30% stenosed.   Mid RCA to Dist RCA lesion is 5% stenosed.   Prox RCA lesion is 20% stenosed.   Ost Cx to Prox Cx lesion is 30% stenosed.   1.  Patent RCA and left circumflex stents with minimal restenosis.  Mild nonobstructive disease overall. 2.  I could not cross the aortic valve due to significant tortuosity of the innominate artery.  EKG:  EKG personally reviewed by me today    PHYSICAL EXAM:    VS:  There were no vitals taken for this visit.  BMI: There is no height or weight on file to calculate BMI.  GEN: Well nourished, well developed in no acute distress NECK: No JVD; No carotid bruits CARDIAC: ***RRR, no murmurs, rubs, gallops RESPIRATORY:  Clear to auscultation without rales, wheezing or rhonchi  ABDOMEN: Soft, non-tender, non-distended EXTREMITIES:  *** No edema; No deformity  Wt Readings from Last 3 Encounters:  01/02/24 216 lb 9.6 oz (98.2 kg)  11/22/23 213 lb 6.4 oz (96.8 kg)  11/16/23 216 lb (98 kg)                  ASSESSMENT & PLAN:   Coronary artery disease   Atrial fibrillation/flutter   HFpEF   Symptomatic bradycardia s/p PPM   Hypertension   Hyperlipidemia   AAA   CKD III   {Are you ordering a CV Procedure (e.g. stress test, cath, DCCV, TEE, etc)?   Press F2        :[id number]}   Disposition: F/u with Dr. Darron or an APP in ***.   Medication Adjustments/Labs and Tests Ordered: Current medicines are reviewed at length with the patient today.  Concerns regarding  medicines are outlined above. Medication changes, Labs and Tests ordered today are summarized above and listed in the Patient Instructions accessible in Encounters.   Bonney Lesley Maffucci, PA-C 01/02/2024 10:09 AM     [facility] [place] [address] [place], [place] [address] [phone]

## 2024-01-02 NOTE — Sedation Documentation (Signed)
 Synchronized cardioversion at 200 J performed. Pt returned to sinus rhythm.

## 2024-01-02 NOTE — Progress Notes (Signed)
 Subjective:    Patient ID: Nathaniel Thompson, male    DOB: 05-26-1935, 88 y.o.   MRN: [id number]  Patient here for  Chief Complaint  Patient presents with   Medical Management of Chronic Issues   Annual Exam    HPI Here for a physical exam. Saw pulmonary 11/22/23- with acute sob cough and wheezing following covid. Recommended symbicort , medrol  taper, albuterol  inhaler. Had f/u Dr Rennie 11/14/23 - intermittent eligard . Continue gentle iron  - venofer  11/14/23. Follow up with pulmonary - f/u OSA. Continue cpap. F/u with cardiology 10/31/23 - s/p DCCV 09/21/23 - monitoring. Continue eliquis . On exam today - noted to have heart rate 140. Initially no increased symptoms. Has some chronic sob with exertion. No chest pain. No abdominal pain or bowel change.    Past Medical History:  Diagnosis Date   (HFpEF) heart failure with preserved ejection fraction (HCC)    a. 05/2018 Echo: Nl EF, Gr1 DD, sev dil LA, mild MR/TR, mild PAH; b. 05/2019 Echo: EF 55-60%, no rwma, mild LVH. Nl PASP. Mildly dil LA. Triv MR. Mild AoV sclerosis w/o stenosis. Mildly dil Asc AO (39mm); d. 02/2023 Echo [facility]): EF >55%, nl RV fxn, mild PR.   AAA (abdominal aortic aneurysm)    Allergic rhinitis    Barrett's esophagus 10/21/2013   Bone cancer (HCC)    Cardiac pacemaker in situ    CKD (chronic kidney disease), stage III (HCC)    Colon polyp, hyperplastic    COPD (chronic obstructive pulmonary disease) (HCC)    mild COPD. former smoker   Coronary artery disease    a. 2003 s/p PCI ([facility]); b. 2004 s/p PCI x 2 ([facility]); c. 11/2017 Cath: LM nl, LAD mild dzs, LCX patent stent w/ 30% distal edge restenosis, RCA patent distal stent w/ 30% prox edge stenosis. Nl EF->Med Rx; d. 02/2021 Cath: LM nl, LAD min irregs, D1/2 min irregs, LCX 10p ISR, 64m, RCA 82m/d ISR, EF 65%; e. 07/2023 PET Stress: antlat and inflat ischemia.   Diverticulosis    Fundic gland polyps of stomach, benign    Gastritis    GERD (gastroesophageal reflux disease)     Hypercholesteremia    Hypertension    PAF (paroxysmal atrial fibrillation) (HCC)    a. CHA2DS2VASc = 7-->eliquis .   Prostate cancer (HCC)    Prostatism    Pulmonary embolism (HCC)    a. 2023 - lobar and segmental PE in setting of RSV.   Reflux esophagitis    Renal stones    Skin cancer    left cheek/lesion excised   Sleep apnea    Symptomatic bradycardia    Tachy-brady syndrome (HCC)    a. 03/2021 s/p MDT MRI compatible DC PPM (ser # [id number]).   Tachycardia    a. 11/2017 admit w/ tachycardia/? afib-->no afib noted upon review (amio/OAC d/c'd).   TIA (transient ischemic attack)    a. 02/2023 admit [facility] - no stroke on MRI; b. 06/2022 EEG: nl for age.   Past Surgical History:  Procedure Laterality Date   CARDIAC CATHETERIZATION     COLONOSCOPY WITH PROPOFOL  N/A 09/13/2015   Procedure: COLONOSCOPY WITH PROPOFOL ;  Surgeon: Gladis RAYMOND Mariner, MD;  Location: [facility] ENDOSCOPY;  Service: Endoscopy;  Laterality: N/A;   COLONOSCOPY WITH PROPOFOL  N/A 05/07/2019   Procedure: COLONOSCOPY WITH PROPOFOL ;  Surgeon: Dessa Reyes ORN, MD;  Location: [facility] ENDOSCOPY;  Service: Endoscopy;  Laterality: N/A;   CORONARY ANGIOPLASTY     ESOPHAGOGASTRODUODENOSCOPY (EGD) WITH PROPOFOL  N/A 09/13/2015  Procedure: ESOPHAGOGASTRODUODENOSCOPY (EGD) WITH PROPOFOL ;  Surgeon: Gladis RAYMOND Mariner, MD;  Location: [facility] ENDOSCOPY;  Service: Endoscopy;  Laterality: N/A;   ESOPHAGOGASTRODUODENOSCOPY (EGD) WITH PROPOFOL  N/A 12/28/2015   Procedure: ESOPHAGOGASTRODUODENOSCOPY (EGD) WITH PROPOFOL ;  Surgeon: Gladis RAYMOND Mariner, MD;  Location: [facility] ENDOSCOPY;  Service: Endoscopy;  Laterality: N/A;   ESOPHAGOGASTRODUODENOSCOPY (EGD) WITH PROPOFOL  N/A 06/16/2016   Procedure: ESOPHAGOGASTRODUODENOSCOPY (EGD) WITH PROPOFOL ;  Surgeon: Gladis RAYMOND Mariner, MD;  Location: [facility] ENDOSCOPY;  Service: Endoscopy;  Laterality: N/A;   ESOPHAGOGASTRODUODENOSCOPY (EGD) WITH PROPOFOL  N/A 05/07/2019   Procedure: ESOPHAGOGASTRODUODENOSCOPY (EGD) WITH  PROPOFOL ;  Surgeon: Dessa Reyes ORN, MD;  Location: [facility] ENDOSCOPY;  Service: Endoscopy;  Laterality: N/A;   EYE SURGERY  2013   CATARACT EXTRACTION   GANGLION CYST EXCISION Right 05/18/2017   Procedure: REMOVAL GANGLION CYST ANKLE;  Surgeon: Ashley Soulier, DPM;  Location: [facility] ORS;  Service: Podiatry;  Laterality: Right;   heart cath stent     LEFT HEART CATH AND CORONARY ANGIOGRAPHY Right 12/03/2017   Procedure: Left Heart Cath and Coronary Angiography with possible coronary intervention;  Surgeon: Fernand Denyse LABOR, MD;  Location: [facility] INVASIVE CV LAB;  Service: Cardiovascular;  Laterality: Right;   LEFT HEART CATH AND CORONARY ANGIOGRAPHY Left 07/27/2023   Procedure: LEFT HEART CATH AND CORONARY ANGIOGRAPHY;  Surgeon: Darron Deatrice LABOR, MD;  Location: [facility] INVASIVE CV LAB;  Service: Cardiovascular;  Laterality: Left;   PACEMAKER IMPLANT N/A 04/04/2021   Procedure: PACEMAKER IMPLANT;  Surgeon: Fernande Elspeth BROCKS, MD;  Location: [facility] INVASIVE CV LAB;  Service: Cardiovascular;  Laterality: N/A;   PROSTATE BIOPSY     RIGHT/LEFT HEART CATH AND CORONARY ANGIOGRAPHY N/A 02/07/2021   Procedure: RIGHT/LEFT HEART CATH AND CORONARY ANGIOGRAPHY;  Surgeon: Darron Deatrice LABOR, MD;  Location: [facility] INVASIVE CV LAB;  Service: Cardiovascular;  Laterality: N/A;   stents     multiple   Family History  Problem Relation Age of Onset   Cancer Mother        gastric and lung   Cancer Father        multiple myeloma   Stroke Father    Cancer Sister        leukemia   Cancer Brother        leukemia   Cancer Brother        kidney   Cancer Daughter        Uterine   Cancer Other        Nephes (Sister's Son): Prostate   Social History   Socioeconomic History   Marital status: Married    Spouse name: Glenda   Number of children: 2   Years of education: Not on file   Highest education level: Not on file  Occupational History   Occupation: retired production designer, theatre/television/film for [employer]  Tobacco Use   Smoking status:  Former    Current packs/day: 0.00    Average packs/day: 1 pack/day for 35.0 years (35.0 ttl pk-yrs)    Types: Cigarettes    Start date: 03/07/1951    Quit date: 03/06/1986    Years since quitting: 37.8   Smokeless tobacco: Former    Types: Chew    Quit date: 1985   Tobacco comments:    Started smoking around 88 yrs old.    Smoked 1 PPD at his heaviest.  Vaping Use   Vaping status: Never Used  Substance and Sexual Activity   Alcohol use: No   Drug use: No   Sexual activity: Not Currently  Other Topics Concern  Not on file  Social History Narrative   Married, Gaetana 2 step children and 2 children from previous marriage   Social Drivers of Corporate Investment Banker Strain: Low Risk  (10/31/2023)   Overall Financial Resource Strain (CARDIA)    Difficulty of Paying Living Expenses: Not hard at all  Food Insecurity: No Food Insecurity (10/31/2023)   Hunger Vital Sign    Worried About Running Out of Food in the Last Year: Never true    Ran Out of Food in the Last Year: Never true  Transportation Needs: No Transportation Needs (10/31/2023)   PRAPARE - Administrator, Civil Service (Medical): No    Lack of Transportation (Non-Medical): No  Physical Activity: Insufficiently Active (10/31/2023)   Exercise Vital Sign    Days of Exercise per Week: 4 days    Minutes of Exercise per Session: 10 min  Stress: No Stress Concern Present (10/31/2023)   [employer] of Occupational Health - Occupational Stress Questionnaire    Feeling of Stress: Not at all  Social Connections: Socially Integrated (10/31/2023)   Social Connection and Isolation Panel    Frequency of Communication with Friends and Family: More than three times a week    Frequency of Social Gatherings with Friends and Family: More than three times a week    Attends Religious Services: More than 4 times per year    Active Member of [employer] or Organizations: Yes    Attends Engineer, Structural: More than 4  times per year    Marital Status: Married     Review of Systems  Constitutional:  Negative for appetite change and unexpected weight change.  HENT:  Negative for congestion and sinus pressure.   Respiratory:  Negative for cough and chest tightness.        Breathing stable.   Cardiovascular:  Negative for chest pain and palpitations.       No increased leg swelling   Gastrointestinal:  Negative for abdominal pain, diarrhea, nausea and vomiting.  Genitourinary:  Negative for difficulty urinating and dysuria.  Musculoskeletal:  Negative for joint swelling and myalgias.  Skin:  Negative for color change and rash.  Neurological:  Negative for dizziness and headaches.  Psychiatric/Behavioral:  Negative for agitation and dysphoric mood.        Objective:     BP 100/70   Pulse (!) 140   Temp 98 F (36.7 C) (Oral)   Ht 5' 10 (1.778 m)   Wt 216 lb 9.6 oz (98.2 kg)   SpO2 97%   BMI 31.08 kg/m  Wt Readings from Last 3 Encounters:  01/02/24 214 lb 11.2 oz (97.4 kg)  01/02/24 216 lb 9.6 oz (98.2 kg)  11/22/23 213 lb 6.4 oz (96.8 kg)    Physical Exam Vitals reviewed.  Constitutional:      General: He is not in acute distress.    Appearance: Normal appearance. He is well-developed.  HENT:     Head: Normocephalic and atraumatic.     Right Ear: External ear normal.     Left Ear: External ear normal.     Mouth/Throat:     Pharynx: No oropharyngeal exudate or posterior oropharyngeal erythema.  Eyes:     General: No scleral icterus.       Right eye: No discharge.        Left eye: No discharge.     Conjunctiva/sclera: Conjunctivae normal.  Cardiovascular:     Comments: Heart rate - 140.  Pulmonary:  Effort: Pulmonary effort is normal. No respiratory distress.     Breath sounds: Normal breath sounds.  Abdominal:     General: Bowel sounds are normal.     Palpations: Abdomen is soft.     Tenderness: There is no abdominal tenderness.  Musculoskeletal:        General: No  swelling or tenderness.     Cervical back: Neck supple. No tenderness.  Lymphadenopathy:     Cervical: No cervical adenopathy.  Skin:    Findings: No erythema or rash.  Neurological:     Mental Status: He is alert.  Psychiatric:        Mood and Affect: Mood normal.        Behavior: Behavior normal.         Outpatient Encounter Medications as of 01/02/2024  Medication Sig   apixaban  (ELIQUIS ) 5 MG TABS tablet Take 1 tablet (5 mg total) by mouth 2 (two) times daily.   budesonide -formoterol  (SYMBICORT ) 160-4.5 MCG/ACT inhaler Inhale 2 puffs into the lungs 2 (two) times daily.   Calcium  Carb-Cholecalciferol  (CALCIUM  + D3 PO) Take 1 tablet by mouth daily.   cholecalciferol  (VITAMIN D3) 25 MCG (1000 UNIT) tablet Take 1,000 Units by mouth daily.   cyanocobalamin  (VITAMIN B12) 1000 MCG tablet Take 1,000 mcg by mouth daily.   Fe Bisgly-Vit C-Vit B12-FA (GENTLE IRON  PO) Take 28 mg by mouth daily.   furosemide  (LASIX ) 40 MG tablet Take 1 tablet (40 mg total) by mouth every Monday, Wednesday, and Friday.   Lacosamide (VIMPAT) 100 MG TABS Take 100 mg by mouth in the morning and at bedtime.   losartan  (COZAAR ) 25 MG tablet Take 1 tablet (25 mg total) by mouth daily.   metoprolol  succinate (TOPROL -XL) 25 MG 24 hr tablet Take 1 tablet (25 mg total) by mouth daily.   Multiple Vitamin (MULTI-VITAMINS) TABS Take 1 tablet by mouth daily.    pantoprazole  (PROTONIX ) 40 MG tablet Take 40 mg by mouth 2 (two) times daily.    isosorbide  mononitrate (IMDUR ) 30 MG 24 hr tablet Take 1 tablet (30 mg total) by mouth daily.   lovastatin  (MEVACOR ) 40 MG tablet Take 2 tablets (80 mg total) by mouth at bedtime.   [DISCONTINUED] isosorbide  mononitrate (IMDUR ) 30 MG 24 hr tablet Take 1 tablet (30 mg total) by mouth daily.   [DISCONTINUED] lovastatin  (MEVACOR ) 40 MG tablet Take 2 tablets (80 mg total) by mouth at bedtime.   [EXPIRED] metoprolol  tartrate (LOPRESSOR ) tablet 25 mg    No facility-administered encounter  medications on file as of 01/02/2024.     Lab Results  Component Value Date   WBC 5.9 01/02/2024   HGB 12.7 (L) 01/02/2024   HCT 38.7 (L) 01/02/2024   PLT 196 01/02/2024   GLUCOSE 97 01/02/2024   CHOL 129 12/28/2023   TRIG 121.0 12/28/2023   HDL 35.30 (L) 12/28/2023   LDLCALC 70 12/28/2023   ALT 11 12/28/2023   AST 15 12/28/2023   NA 139 01/02/2024   K 4.1 01/02/2024   CL 107 01/02/2024   CREATININE 1.32 (H) 01/02/2024   BUN 21 01/02/2024   CO2 22 01/02/2024   TSH 2.19 05/16/2023   PSA 5.47 (H) 08/03/2016   INR 1.2 02/16/2023   HGBA1C 5.4 12/28/2023    DG Chest Port 1 View Result Date: 09/21/2023 CLINICAL DATA:  10009 Palpitations 10009 EXAM: PORTABLE CHEST 1 VIEW COMPARISON:  Chest x-ray 03/15/2023 FINDINGS: Left chest wall 2 lead pacemaker. The heart and mediastinal contours are unchanged. Atherosclerotic  plaque. Redemonstration of right upper lobe 9 mm calcified nodule. No focal consolidation. No pulmonary edema. No pleural effusion. No pneumothorax. No acute osseous abnormality. IMPRESSION: No active disease. Electronically Signed   By: Morgane  Naveau M.D.   On: 09/21/2023 16:56       Assessment & Plan:  Hyperglycemia Assessment & Plan: Follow met b and A1c.   Orders: -     Hemoglobin A1c; Future  Hypercholesterolemia Assessment & Plan: Continue lovastatin .  Follow lipid panel and liver function tests. Follow lipid panel.   Orders: -     Lipid panel; Future -     Hepatic function panel; Future  Hypertension, essential Assessment & Plan: Blood pressure as outlined. Continues on metoprolol , imdur  and losartan . Taking his medication regularly. Did take metoprolol  this am. Was given additional 25mg  metoprolol  here in the office. Heart rate remained elevated. Blood pressure stable. Follow.   Orders: -     Basic metabolic panel with GFR; Future  Prostate cancer (HCC)  Heart rate fast -     EKG 12-Lead -     EKG 12-Lead -     Metoprolol  Tartrate  Abdominal  aortic aneurysm (AAA) without rupture, unspecified part Assessment & Plan: Saw [facility]) 01/2022- recommended f/u aortic duplex in 12 months. AAA US  01/22/23: unchanged from prior measuring 2.8 cm in diameter. Will continue to monitor for symptoms, otherwise recommend continued outpatient surveillance. Per note, f/u aortic ultrasound in 12 months. Confirm f/u scheduled.    Anemia, unspecified type Assessment & Plan: Followed by hematology for IDA.  On oral iron .  Reports iron  infusion - 09/13/23. SABRA Had f/u Dr Rennie 11/14/23 - intermittent eligard . Continue gentle iron  - venofer  11/14/23.    Atrial fibrillation, unspecified type [facility]) Assessment & Plan: F/u with cardiology 10/31/23 - s/p DCCV 09/21/23 - monitoring. Continue eliquis . On exam today - noted to have heart rate 140. Initially no increased symptoms. Has some chronic sob with exertion. No chest pain.  Was given 25mg  metoprolol  here in the office. Heart rate remained 140s. EKG - appeared to be c/w aflutter - 140s.  With persistent increased heart rate, he became more symptomatic - some increased fatigue and some brief chest discomfort. EMS called and pt transported to ER for further evaluation and treatment.    Barrett's esophagus without dysplasia Assessment & Plan: Previously saw GI. Continue PPI.  Elected to hold on EGD.  Follow.    Sleep apnea, unspecified type Assessment & Plan: Continue cpap.    Popliteal artery aneurysm Assessment & Plan: Had f/u with [facility] 01/2023. Stable.    Lung nodule Assessment & Plan: Evaluated by Dr Tamea. No further w/up felt warranted.    History of TIA (transient ischemic attack) Assessment & Plan: Continue eliquis  and statin therapy. Continue blood pressure control and risk factor modification.    Coronary artery disease involving native coronary artery of native heart with angina pectoris Assessment & Plan: Sees cardiology. Continue isosorbide , losartan , lovastatin  and  metoprolol . Has brief episode of chest pressure here in the office with increased heart rate.  EKG - appears to be aflutter - 140. To ER for further evaluation and treatment. Cardiology notified.    Chronic obstructive pulmonary disease, unspecified COPD type (HCC) Assessment & Plan: Breathing overall stable.    Stage 3a chronic kidney disease (HCC) Assessment & Plan: Continue to avoid antiinflammatories. Follow metabolic panel.    Chronic heart failure with preserved ejection fraction [facility]) Assessment & Plan: Followed by cardiology.  ECHO - 05/2019 -  EF 55-60%.  Continues on losartan , imdur .  TTE negative for LV thrombus, LVEF >55%. No evidence of volume overload on exam.     Bilateral carotid artery stenosis Assessment & Plan: Evaluated 01/2023 - Duplex ultrasound shows 1-39% ICA stenosis bilaterally. Continue antiplatelet therapy as prescribed Continue management of CAD, HTN and Hyperlipidemia. Follow up in 24-36 months with duplex ultrasound and physical exam   Other orders -     Isosorbide  Mononitrate ER; Take 1 tablet (30 mg total) by mouth daily.  Dispense: 90 tablet; Refill: 1 -     Lovastatin ; Take 2 tablets (80 mg total) by mouth at bedtime.  Dispense: 180 tablet; Refill: 1   I spent 45 minutes with the patient. Specifically time spent discussing his current heart rhythm and rate. Time spent discussing her current concerns and symptoms.  Time also spent discussing further w/up, evaluation and treatment.    Allena Hamilton, MD

## 2024-01-02 NOTE — Telephone Encounter (Signed)
 Dr. Glendia stated patient is in her office now and wants to consult with R. Dunn regarding patient's appointment this afternoon at 1:30 pm.  Dr. Glendia also stated she sent him a secure chat message or can have her pulled out of room.

## 2024-01-02 NOTE — Sedation Documentation (Addendum)
 Medication dose calculated and verified for: Etomidate 29.22 mg  Per Dr. Viviann start with Etomidate 20 mg and will administer remaining if needed.

## 2024-01-02 NOTE — ED Notes (Addendum)
 After administration of 10mg  IV metoprolol , pt became lethargic, responsive to verbal stimuli. Dr Viviann called and to bedside. Repeat EKG performed and bp obtained.

## 2024-01-02 NOTE — Sedation Documentation (Signed)
 Paper consent signed and placed in chart. E-Signature pad not working

## 2024-01-03 ENCOUNTER — Ambulatory Visit: Payer: Self-pay | Admitting: Internal Medicine

## 2024-01-08 ENCOUNTER — Ambulatory Visit (INDEPENDENT_AMBULATORY_CARE_PROVIDER_SITE_OTHER): Payer: Medicare HMO

## 2024-01-08 DIAGNOSIS — I480 Paroxysmal atrial fibrillation: Secondary | ICD-10-CM

## 2024-01-08 LAB — CUP PACEART REMOTE DEVICE CHECK
Battery Remaining Longevity: 125 mo
Battery Voltage: 3.02 V
Brady Statistic AP VP Percent: 0.11 %
Brady Statistic AP VS Percent: 82.54 %
Brady Statistic AS VP Percent: 0.07 %
Brady Statistic AS VS Percent: 17.32 %
Brady Statistic RA Percent Paced: 78.32 %
Brady Statistic RV Percent Paced: 0.22 %
Date Time Interrogation Session: 20251104013930
Implantable Lead Connection Status: 753985
Implantable Lead Connection Status: 753985
Implantable Lead Implant Date: 20230130
Implantable Lead Implant Date: 20230130
Implantable Lead Location: 753859
Implantable Lead Location: 753860
Implantable Lead Model: 3830
Implantable Lead Model: 5076
Implantable Pulse Generator Implant Date: 20230130
Lead Channel Impedance Value: 304 Ohm
Lead Channel Impedance Value: 380 Ohm
Lead Channel Impedance Value: 437 Ohm
Lead Channel Impedance Value: 589 Ohm
Lead Channel Pacing Threshold Amplitude: 0.75 V
Lead Channel Pacing Threshold Amplitude: 0.875 V
Lead Channel Pacing Threshold Pulse Width: 0.4 ms
Lead Channel Pacing Threshold Pulse Width: 0.4 ms
Lead Channel Sensing Intrinsic Amplitude: 1.625 mV
Lead Channel Sensing Intrinsic Amplitude: 1.625 mV
Lead Channel Sensing Intrinsic Amplitude: 3.125 mV
Lead Channel Sensing Intrinsic Amplitude: 3.125 mV
Lead Channel Setting Pacing Amplitude: 1.75 V
Lead Channel Setting Pacing Amplitude: 2 V
Lead Channel Setting Pacing Pulse Width: 0.4 ms
Lead Channel Setting Sensing Sensitivity: 0.9 mV
Zone Setting Status: 755011

## 2024-01-13 MED ORDER — ISOSORBIDE MONONITRATE ER 30 MG PO TB24: 30.0000 mg | ORAL_TABLET | Freq: Every day | ORAL | 1 refills | Status: AC

## 2024-01-13 MED ORDER — LOVASTATIN 40 MG PO TABS: 80.0000 mg | ORAL_TABLET | Freq: Every day | ORAL | 1 refills | Status: AC

## 2024-01-13 NOTE — Assessment & Plan Note (Signed)
 Continue lovastatin .  Follow lipid panel and liver function tests. Follow lipid panel.

## 2024-01-13 NOTE — Assessment & Plan Note (Signed)
 Follow met b and A1c.

## 2024-01-13 NOTE — Assessment & Plan Note (Signed)
 Evaluated by Dr Jayme Cloud. No further w/up felt warranted.

## 2024-01-13 NOTE — Assessment & Plan Note (Signed)
 Continue cpap.

## 2024-01-13 NOTE — Assessment & Plan Note (Signed)
 Had f/u with [facility] 01/2023. Stable.

## 2024-01-13 NOTE — Assessment & Plan Note (Signed)
 Blood pressure as outlined. Continues on metoprolol , imdur  and losartan . Taking his medication regularly. Did take metoprolol  this am. Was given additional 25mg  metoprolol  here in the office. Heart rate remained elevated. Blood pressure stable. Follow.

## 2024-01-13 NOTE — Assessment & Plan Note (Signed)
 Followed by cardiology.  ECHO - 05/2019 - EF 55-60%.  Continues on losartan, imdur.  TTE negative for LV thrombus, LVEF >55%. No evidence of volume overload on exam.

## 2024-01-13 NOTE — Assessment & Plan Note (Signed)
 Sees cardiology. Continue isosorbide , losartan , lovastatin  and metoprolol . Has brief episode of chest pressure here in the office with increased heart rate.  EKG - appears to be aflutter - 140. To ER for further evaluation and treatment. Cardiology notified.

## 2024-01-13 NOTE — Assessment & Plan Note (Signed)
 Continue eliquis  and statin therapy. Continue blood pressure control and risk factor modification.

## 2024-01-13 NOTE — Assessment & Plan Note (Signed)
 Followed by hematology for IDA.  On oral iron .  Reports iron  infusion - 09/13/23. SABRA Had f/u Dr Rennie 11/14/23 - intermittent eligard . Continue gentle iron  - venofer  11/14/23.

## 2024-01-13 NOTE — Assessment & Plan Note (Signed)
 F/u with cardiology 10/31/23 - s/p DCCV 09/21/23 - monitoring. Continue eliquis . On exam today - noted to have heart rate 140. Initially no increased symptoms. Has some chronic sob with exertion. No chest pain.  Was given 25mg  metoprolol  here in the office. Heart rate remained 140s. EKG - appeared to be c/w aflutter - 140s.  With persistent increased heart rate, he became more symptomatic - some increased fatigue and some brief chest discomfort. EMS called and pt transported to ER for further evaluation and treatment.

## 2024-01-13 NOTE — Assessment & Plan Note (Signed)
Previously saw GI. Continue PPI.  Elected to hold on EGD.  Follow.  

## 2024-01-13 NOTE — Assessment & Plan Note (Signed)
 Saw [facility]) 01/2022- recommended f/u aortic duplex in 12 months. AAA US  01/22/23: unchanged from prior measuring 2.8 cm in diameter. Will continue to monitor for symptoms, otherwise recommend continued outpatient surveillance. Per note, f/u aortic ultrasound in 12 months. Confirm f/u scheduled.

## 2024-01-13 NOTE — Assessment & Plan Note (Signed)
 Breathing overall stable.  ?

## 2024-01-13 NOTE — Assessment & Plan Note (Signed)
Continue to avoid antiinflammatories.  Follow metabolic panel.  

## 2024-01-13 NOTE — Assessment & Plan Note (Signed)
 Evaluated 01/2023 - Duplex ultrasound shows 1-39% ICA stenosis bilaterally. Continue antiplatelet therapy as prescribed Continue management of CAD, HTN and Hyperlipidemia. Follow up in 24-36 months with duplex ultrasound and physical exam

## 2024-01-14 ENCOUNTER — Encounter: Payer: Self-pay | Admitting: Pulmonary Disease

## 2024-01-14 ENCOUNTER — Ambulatory Visit: Admitting: Pulmonary Disease

## 2024-01-14 VITALS — BP 126/76 | HR 80 | Temp 98.1°F | Ht 70.0 in | Wt 216.0 lb

## 2024-01-14 DIAGNOSIS — R0602 Shortness of breath: Secondary | ICD-10-CM

## 2024-01-14 DIAGNOSIS — J4520 Mild intermittent asthma, uncomplicated: Secondary | ICD-10-CM

## 2024-01-14 DIAGNOSIS — G4733 Obstructive sleep apnea (adult) (pediatric): Secondary | ICD-10-CM

## 2024-01-14 LAB — NITRIC OXIDE: Nitric Oxide: 23

## 2024-01-14 NOTE — Patient Instructions (Signed)
 VISIT SUMMARY:  You visited us  today due to ongoing shortness of breath, which has improved since your last visit. We discussed your history of asthma, recent heart rhythm issues, and problems with your CPAP machine. You also mentioned discontinuing Symbicort  after your symptoms cleared up.  YOUR PLAN:  -ASTHMA WITH PERSISTENT SHORTNESS OF BREATH: Asthma is a condition where your airways narrow and swell, producing extra mucus, which can make breathing difficult. Your shortness of breath may be worsened by recent heart issues and a possible post-viral cough. We have ordered breathing tests to check your airway inflammation and function. If your cough returns, please start using Symbicort  again. Continue using your Albuterol  inhaler for any wheezing or shortness of breath.  INSTRUCTIONS:  Please schedule a follow-up appointment in a couple of months, and make sure to complete the breathing tests before your next visit.

## 2024-01-14 NOTE — Progress Notes (Signed)
 Remote PPM Transmission

## 2024-01-14 NOTE — Progress Notes (Signed)
 Subjective:    Patient ID: Nathaniel Thompson, male    DOB: 1935/05/30, 88 y.o.   MRN: [id number]  Patient Care Team: Glendia Shad, MD as PCP - General (Internal Medicine) Darron Deatrice LABOR, MD as PCP - Cardiology (Cardiology) Kennyth Chew, MD as PCP - Electrophysiology (Clinical Cardiac Electrophysiology) Darron Deatrice LABOR, MD as Consulting Physician (Cardiology) Rennie Cindy JONELLE, MD as Consulting Physician (Hematology and Oncology) Jama, Cordella MATSU, MD (Vascular Surgery) Jane Delmar Pike, NP as Nurse Practitioner (Gastroenterology) Tamea Dedra CROME, MD as Consulting Physician (Pulmonary Disease)  Chief Complaint  Patient presents with   Asthma    Shortness of breath on exertion. Stopped using Symbicort  x a few weeks. Denies any cough.     BACKGROUND/INTERVAL:Patient is an 88 year old with prior history of obstructive sleep apnea and chronic bronchitis previously evaluated here by Dr. Laurance Flor for dyspnea on exertion and sleep apnea, last seen 19 September 2018 by Dr. Flor.  First visit with me on 01 May 2023 for evaluation of a lung nodule which is a granuloma.  Was seen on 22 November 2023 for an ACUTE visit due to cough, shortness of breath and wheezing.  He presents today for follow-up from that visit.  HPI Discussed the use of AI scribe software for clinical note transcription with the patient, who gave verbal consent to proceed.  History of Present Illness   An 88 year old male with asthma presents with shortness of breath.  He continues to experience shortness of breath, although it has improved since his last visit. He attributes some of his symptoms to aging. He has undergone two cardioversions in the past three months for irregular heart rhythm.  He has been told he needs cardioversion again.  He uses a CPAP machine but experiences issues with the equipment, such as water entering his mouth during the night, which disrupts his  sleep. He discontinued using Symbicort  a couple of weeks ago after his symptoms cleared up. He has been prescribed prednisone  and doxycycline  in the past for respiratory issues. He associates his cough with a previous viral infection, possibly related to COVID-19, which he believes exacerbated his asthma.  He is not currently using Symbicort  because his cough resolved. He has an albuterol  inhaler for emergency use in case of wheezing or shortness of breath.      Prior data documented on Dr. Jamel visits: **Desat walk 09/19/18>> at rest on RA, sat was 96% and HR 55, walked 360 feet at fast pace with sat 96% and HR 85. Moderate dyspnea.  **CPAP download 07/10/2018-08/08/2018>> uses greater than 4 hours is 28/30 days.  Average usage on days used is 5 hours 36 minutes.  Pressure ranges 10-20.  Median pressure 12, 95th percentile pressure 15, maximum pressure 17.  Leaks are within normal limits.  Residual AHI is 4.9.  Overall this shows very good compliance with with CPAP and excellent control obstructive sleep apnea. **PFT 09/16/2018>> tracings personally reviewed.  FVC is 98% predicted, FEV1 is 104% predicted, ratio is 76%.  There is no significant improvement with bronchodilator.  TLC is 94% predicted.  Ratio is within normal limits.  Flow volume loop is unremarkable.  DLCO 62% predicted.  Overall this shows normal pulmonary function without evidence of significant COPD/emphysema. **Chest x-ray 04/30/2018>> imaging personally reviewed, mild changes of chronic bronchitis, lungs are otherwise unremarkable.  Nodule in the right upper lung zone. **CPAP download 03/31/2018-04/29/2018>> raw data personally reviewed.  Usage greater than 4 hours is 20/30 days.  Average  usage on days used is 5 hours 11 minutes.  Pressure ranges 5-20.  Median pressure 13, 95th percentile pressure 16, max pressure 17.5.  Leaks are within normal limits.  Residual AHI is 4.  Overall this shows inadequate compliance with CPAP with good  control of obstructive sleep apnea when used. **Left heart cath 12/03/2017>> mild stable coronary artery disease,no significant CAD with normal LVEF. **CPAP titration 12/24/2009>>  **Review of old records, previous sleep study 12/02/2009.  AHI of 20, consistent with moderate obstructive sleep apnea.  CPAP was recommended.   DATA 02/12/2023 echocardiogram [facility]): LVEF over 55%, grade 1 DD.  RV appears normal, mild mitral regurgitation, mild pulmonic regurgitation 03/23/2023 CT chest without contrast: No CT evidence of acute intrathoracic abnormality, very mild subpleural reticulation consistent with fibrosis.  Calcified granuloma in the right upper lobe for which no additional imaging follow-up is recommended, cardiomegaly.  Review of Systems A 10 point review of systems was performed and it is as noted above otherwise negative.   Patient Active Problem List   Diagnosis Date Noted   Abnormal chest x-ray 11/16/2023   Abnormal stress test 07/27/2023   Lung nodule 03/11/2023   Paresthesia of arm 02/17/2023   Healthcare maintenance 08/01/2022   Sinus bradycardia 05/16/2022   VT (ventricular tachycardia) (HCC) - non-sustained 05/16/2022   Pacemaker - MDT 05/16/2022   Persistent dry cough 01/07/2022   Primary osteoarthritis of left knee 11/11/2021   Shortness of breath    Unstable angina (HCC)    Chronic heart failure with preserved ejection fraction (HCC) 06/23/2020   Foot pain 07/16/2019   Nasal congestion 07/16/2019   B12 deficiency 04/06/2019   Iron  deficiency anemia due to chronic blood loss 03/31/2019   GERD without esophagitis 12/30/2018   History of coronary artery disease 12/30/2018   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/30/2018   Abdominal aortic aneurysm (AAA) 12/07/2018   Anemia 12/07/2018   Right hip pain 11/22/2018   Atrial fibrillation (HCC) 11/27/2017   Dizziness 08/12/2017   Nasal erythema 04/10/2017   Carotid artery disease 08/06/2016   History of TIA (transient ischemic  attack) 07/24/2016   Sleep apnea 07/24/2016   COPD (chronic obstructive pulmonary disease) (HCC) 07/24/2016   CKD (chronic kidney disease) stage 3, GFR 30-59 ml/min (HCC) 07/24/2016   Barrett's esophagus 07/24/2016   Hyperglycemia 07/24/2016   Hypercholesterolemia 07/12/2016   Hypertension, essential 07/12/2016   Coronary artery disease involving native heart with angina pectoris 07/12/2016   Prostate cancer (HCC) 11/15/2015   Family history of cancer 11/15/2015   Goals of care, counseling/discussion 06/18/2014   Acute rhinitis 02/12/2014   S/P coronary artery stent placement 10/02/2013   History of skin cancer 09/02/2013    Social History   Tobacco Use   Smoking status: Former    Current packs/day: 0.00    Average packs/day: 1 pack/day for 35.0 years (35.0 ttl pk-yrs)    Types: Cigarettes    Start date: 03/07/1951    Quit date: 03/06/1986    Years since quitting: 37.8   Smokeless tobacco: Former    Types: Chew    Quit date: 1985   Tobacco comments:    Started smoking around 88 yrs old.    Smoked 1 PPD at his heaviest.  Substance Use Topics   Alcohol use: No    Allergies  Allergen Reactions   Atorvastatin Other (See Comments)    Achy joints    Current Meds  Medication Sig   albuterol  (VENTOLIN  HFA) 108 (90 Base) MCG/ACT inhaler Inhale into  the lungs every 6 (six) hours as needed for wheezing or shortness of breath.   apixaban  (ELIQUIS ) 5 MG TABS tablet Take 1 tablet (5 mg total) by mouth 2 (two) times daily.   Calcium  Carb-Cholecalciferol  (CALCIUM  + D3 PO) Take 1 tablet by mouth daily.   cholecalciferol  (VITAMIN D3) 25 MCG (1000 UNIT) tablet Take 1,000 Units by mouth daily.   cyanocobalamin  (VITAMIN B12) 1000 MCG tablet Take 1,000 mcg by mouth daily.   Fe Bisgly-Vit C-Vit B12-FA (GENTLE IRON  PO) Take 28 mg by mouth daily.   furosemide  (LASIX ) 40 MG tablet Take 1 tablet (40 mg total) by mouth every Monday, Wednesday, and Friday.   isosorbide  mononitrate (IMDUR ) 30 MG 24  hr tablet Take 1 tablet (30 mg total) by mouth daily.   Lacosamide (VIMPAT) 100 MG TABS Take 100 mg by mouth in the morning and at bedtime.   losartan  (COZAAR ) 25 MG tablet Take 1 tablet (25 mg total) by mouth daily.   lovastatin  (MEVACOR ) 40 MG tablet Take 2 tablets (80 mg total) by mouth at bedtime.   metoprolol  succinate (TOPROL -XL) 25 MG 24 hr tablet Take 1 tablet (25 mg total) by mouth daily.   Multiple Vitamin (MULTI-VITAMINS) TABS Take 1 tablet by mouth daily.    pantoprazole  (PROTONIX ) 40 MG tablet Take 40 mg by mouth 2 (two) times daily.     Immunization History  Administered Date(s) Administered   Fluad Quad(high Dose 65+) 11/05/2018, 11/06/2019, 11/17/2020, 11/24/2021   Fluad Trivalent(High Dose 65+) 11/14/2022   INFLUENZA, HIGH DOSE SEASONAL PF 12/26/2012, 12/17/2014, 12/17/2015, 11/28/2016, 12/18/2023   Influenza, Seasonal, Injecte, Preservative Fre 12/22/2010   Influenza,inj,Quad PF,6+ Mos 12/28/2011, 12/18/2013   Influenza-Unspecified 12/05/2011, 12/05/2015, 11/04/2017   Moderna Covid-19 Vaccine Bivalent Booster 63yrs & up 12/06/2021   PFIZER Comirnaty(Gray Top)Covid-19 Tri-Sucrose Vaccine 04/28/2019, 05/26/2019   PFIZER(Purple Top)SARS-COV-2 Vaccination 03/25/2019, 04/19/2019, 12/11/2019   Pneumococcal Conjugate-13 07/12/2016   Pneumococcal Polysaccharide-23 03/06/2006   Td 09/03/1992   Zoster Recombinant(Shingrix ) 01/07/2019, 03/10/2019        Objective:     BP 126/76   Pulse 80   Temp 98.1 F (36.7 C) (Temporal)   Ht 5' 10 (1.778 m)   Wt 216 lb (98 kg)   SpO2 98%   BMI 30.99 kg/m   SpO2: 98 %  GENERAL: Well-developed, well-nourished elderly gentleman, no acute distress.  Fully ambulatory. HEAD: Normocephalic, atraumatic.  EYES: Pupils equal, round, reactive to light.  No scleral icterus.  MOUTH: Wears upper dentures, oral mucosa moist.  No thrush NECK: Supple. No thyromegaly. Trachea midline. No JVD.  No adenopathy. PULMONARY: Good air entry  bilaterally.  Scattered wheezes throughout. CARDIOVASCULAR: S1 and S2. Regular rate and rhythm.  There is a grade 1/6 mitral regurgitation murmur noted. ABDOMEN: Benign. MUSCULOSKELETAL: No joint deformity, no clubbing, no edema.  NEUROLOGIC: No overt focal deficit, no gait disturbance, speech is fluent. SKIN: Intact,warm,dry. PSYCH: Mood and behavior normal.  Lab Results  Component Value Date   NITRICOXIDE 23 01/14/2024  *This result suggests low (<25) Type II (T2) airway inflammation indicating a low likelihood of active T2-driven airway inflammation.  In a patient with active T2 driven asthma management it suggests good control. *Trend:39>>23 ppb      Assessment & Plan:     ICD-10-CM   1. Shortness of breath  R06.02 Nitric oxide     Pulmonary function test    2. Intermittent asthma without complication, unspecified asthma severity  J45.20     3. OSA on CPAP  G47.33  Orders Placed This Encounter  Procedures   Nitric oxide    Pulmonary function test    Standing Status:   Future    Expiration Date:   01/13/2025    Where should this test be performed?:   Outpatient Pulmonary    What type of PFT is being ordered?:   Full PFT   Discussion:    Asthma with persistent shortness of breath Asthma with persistent shortness of breath, likely exacerbated by recent cardiac issues and possible post-viral cough. Low nitric oxide  level indicates low type two inflammation. Shortness of breath may be related to abnormal heart rhythm. - Ordered breathing tests to assess shortness of breath. - Instructed to use Symbicort  if coughing recurs. - Advised to use Albuterol  as an emergency inhaler for wheezing or shortness of breath. - Scheduled follow-up appointment in 2 months with breathing tests.    Advised if symptoms do not improve or worsen, to please contact office for sooner follow up or seek emergency care.    I spent 33 minutes of dedicated to the care of this patient on the date  of this encounter to include pre-visit review of records, face-to-face time with the patient discussing conditions above, post visit ordering of testing, clinical documentation with the electronic health record, making appropriate referrals as documented, and communicating necessary findings to members of the patients care team.     C. Leita Sanders, MD Advanced Bronchoscopy PCCM [facility]    *This note was generated using voice recognition software/Dragon and/or AI transcription program.  Despite best efforts to proofread, errors can occur which can change the meaning. Any transcriptional errors that result from this process are unintentional and may not be fully corrected at the time of dictation.

## 2024-01-15 ENCOUNTER — Encounter: Payer: Self-pay | Admitting: Internal Medicine

## 2024-01-19 NOTE — Progress Notes (Unsigned)
 Cardiology Office Note    Date:  01/23/2024   ID:  [name], DOB 12/29/1935, MRN [id number]  PCP:  Glendia Shad, MD  Cardiologist:  Deatrice Cage, MD  Electrophysiologist:  Fonda Kitty, MD   Chief Complaint: ED follow-up  History of Present Illness:   Nathaniel Thompson is a 88 y.o. male with history of CAD status post multiple PCI as outlined below, symptomatic bradycardia status post PPM in 03/2021, PAF/flutter on apixaban  status post DCCV in 12/2023, HFpEF, TIA, metastatic prostate cancer, AAA followed by vascular surgery, CKD stage III, provoked PE in 2023, HTN, HLD, and OSA on CPAP who presents for ED follow-up of atrial flutter.  He has a known history of CAD status post stenting to the LCx and RCA.  He had PCI done in 2003 at [facility].  He had repeat PCI x 2 at [facility] in 2004.  He was admitted to the hospital in 11/2017 with questionable tachycardia and A-fib.  Initially, he was placed on amiodarone  and anticoagulation, but all his EKGs revealed sinus rhythm at that time and both were discontinued.  Echo in 05/2018 showed normal LV systolic function and grade 1 diastolic dysfunction with severely dilated left atrium, mild mitral and tricuspid regurgitation, and mild pulmonary hypertension.  In 02/2021, he had worsening shortness of breath and chest pain.  LHC showed patent stents in the LCx and RCA with no obstructive CAD.  RHC showed mildly elevated filling pressures, mild pulmonary hypertension, and normal cardiac output.  During the procedure he was noted to have intermittent bradycardia with heart rate going into the low 40s bpm.  Subsequent outpatient monitor showed frequent episodes of SVT.  He was referred to EP and underwent pacemaker implantation in 03/2021.  In late 2023, he fell from a deer stand and fractured his left posterior calcaneus.  He was subsequently admitted to [facility] for shortness of breath, cough, and fever in the setting of displaced fracture of the left posterior  calcaneus.  He was found to have RSV and lobar and segmental pulmonary embolism.  He was also noted to have episodes of tachycardia and found to be in A-fib with RVR that lasted for about 45 minutes.  He was placed on anticoagulation and metoprolol .  He was admitted to [facility] in 02/2023 with slurred speech, right arm weakness, and suspected stroke.  tPA was not given (on Eliquis ).  He was hypertensive on presentation.  MRI of the brain showed no acute stroke.  Echo showed normal LV systolic function with no significant valvular abnormalities.  Episode was thought to be a TIA.   He was seen in 07/2023 with increased shortness of breath and decreased exercise tolerance.  Myocardial PET showed anterolateral and inferior lateral ischemia.  Subsequent LHC in 07/2023 showed patent RCA and LCx stents with minimal restenosis.  There was mild nonobstructive disease overall.  Echo showed normal LV systolic function with grade 1 diastolic dysfunction and mild mitral regurgitation.  He was seen in the office on 09/21/2023 reporting a 3 to 4-day history of fatigue and tachycardic rates.  He was found to be in atrial flutter with 2-1 AV block.  He had been adherent to pharmacotherapy including anticoagulation.  ATP was attempted in the clinic without success and he was transferred to the ED.  Workup in the ER showed nonacute chest x-ray and a normal troponin.  While preparing for DCCV, he spontaneously converted to sinus rhythm, though redeveloped atrial flutter after about 10 to 15 minutes  with rates in the 130s bpm leading to successful DCCV.  He was last seen by general cardiology in 09/2023 with EKG showing atrial paced rhythm with occasional AV pacing.  He reported a several month history of intermittent shortness of breath.  He was advised to take furosemide  40 mg daily for 4 days transition of furosemide  to 40 mg on Mondays, Wednesdays, and Fridays to simplify dosing and with difficulty in taking furosemide  on Sundays.  It was  recommended he follow-up with the EP to determine if he was having paroxysms of atrial arrhythmia contributing to dyspnea overload.  Device interrogation in 10/2023 showed low atrial arrhythmia burden except for mid 09/2023 with recommendation to continue to monitor over initiation of arrhythmic therapy.  He was evaluated by pulmonary in 11/2023 with shortness of breath, cough, and wheezing following COVID infection with recommendation for steroid taper, Symbicort , and albuterol .  He was seen in his PCP's office on 01/02/2024 noted to have a heart rate of 140 bpm with EKG demonstrating atrial flutter with RVR.  He was given metoprolol  25 mg without improvement in rates.  Initially, he was without escalation in symptoms, though with persistent tachycardic rates he became increasingly fatigued and had some brief chest discomfort.  In this setting he was transferred to the [facility] ED where he remained in atrial flutter with RVR.  He was given IV metoprolol  with heart rates in the 80s to low 100s bpm, remaining in atrial flutter.  He had been compliant with anticoagulation and underwent successful DCCV in the ED with conversion to sinus rhythm.  Device interrogation in 01/2024 showed an increase in overall A-fib/flutter burden at 7.3%  He comes in accompanied by his wife today and is doing well from a cardiac perspective.  No symptoms of angina or cardiac decompensation.  No palpitations or dyspnea.  He does note some positional dizziness, particularly if he stands up quickly or stands up from a bending position.  He does report an episode of near syncope after standing quickly and beginning to take a couple of steps.  No frank syncope.  No falls, hematochezia, or melena.  No progressive orthopnea.  His weight is down 6 pounds today when compared to his last visit on the side of the office using the same scale.   Labs independently reviewed: 12/2023 - high-sensitivity troponin 22 with a delta troponin 25, magnesium  2.0, Hgb 12.7, PLT 196, potassium 4.1, BUN 21, serum creatinine 1.32, TC 129, TG 121, HDL 35, LDL 70, A1c 5.4, albumin 3.8, AST/ALT normal 05/2023 - TSH normal   Past Medical History:  Diagnosis Date   (HFpEF) heart failure with preserved ejection fraction (HCC)    a. 05/2018 Echo: Nl EF, Gr1 DD, sev dil LA, mild MR/TR, mild PAH; b. 05/2019 Echo: EF 55-60%, no rwma, mild LVH. Nl PASP. Mildly dil LA. Triv MR. Mild AoV sclerosis w/o stenosis. Mildly dil Asc AO (39mm); d. 02/2023 Echo [facility]): EF >55%, nl RV fxn, mild PR.   AAA (abdominal aortic aneurysm)    Allergic rhinitis    Barrett's esophagus 10/21/2013   Bone cancer (HCC)    Cardiac pacemaker in situ    CKD (chronic kidney disease), stage III (HCC)    Colon polyp, hyperplastic    COPD (chronic obstructive pulmonary disease) (HCC)    mild COPD. former smoker   Coronary artery disease    a. 2003 s/p PCI ([facility]); b. 2004 s/p PCI x 2 ([facility]); c. 11/2017 Cath: LM nl, LAD mild dzs,  LCX patent stent w/ 30% distal edge restenosis, RCA patent distal stent w/ 30% prox edge stenosis. Nl EF->Med Rx; d. 02/2021 Cath: LM nl, LAD min irregs, D1/2 min irregs, LCX 10p ISR, 87m, RCA 28m/d ISR, EF 65%; e. 07/2023 PET Stress: antlat and inflat ischemia.   Diverticulosis    Fundic gland polyps of stomach, benign    Gastritis    GERD (gastroesophageal reflux disease)    Hypercholesteremia    Hypertension    PAF (paroxysmal atrial fibrillation) (HCC)    a. CHA2DS2VASc = 7-->eliquis .   Prostate cancer (HCC)    Prostatism    Pulmonary embolism (HCC)    a. 2023 - lobar and segmental PE in setting of RSV.   Reflux esophagitis    Renal stones    Skin cancer    left cheek/lesion excised   Sleep apnea    Symptomatic bradycardia    Tachy-brady syndrome (HCC)    a. 03/2021 s/p MDT MRI compatible DC PPM (ser # [id number]).   Tachycardia    a. 11/2017 admit w/ tachycardia/? afib-->no afib noted upon review (amio/OAC d/c'd).   TIA (transient ischemic attack)    a.  02/2023 admit [facility] - no stroke on MRI; b. 06/2022 EEG: nl for age.    Past Surgical History:  Procedure Laterality Date   CARDIAC CATHETERIZATION     COLONOSCOPY WITH PROPOFOL  N/A 09/13/2015   Procedure: COLONOSCOPY WITH PROPOFOL ;  Surgeon: Gladis RAYMOND Mariner, MD;  Location: [facility] ENDOSCOPY;  Service: Endoscopy;  Laterality: N/A;   COLONOSCOPY WITH PROPOFOL  N/A 05/07/2019   Procedure: COLONOSCOPY WITH PROPOFOL ;  Surgeon: Dessa Reyes ORN, MD;  Location: [facility] ENDOSCOPY;  Service: Endoscopy;  Laterality: N/A;   CORONARY ANGIOPLASTY     ESOPHAGOGASTRODUODENOSCOPY (EGD) WITH PROPOFOL  N/A 09/13/2015   Procedure: ESOPHAGOGASTRODUODENOSCOPY (EGD) WITH PROPOFOL ;  Surgeon: Gladis RAYMOND Mariner, MD;  Location: [facility] ENDOSCOPY;  Service: Endoscopy;  Laterality: N/A;   ESOPHAGOGASTRODUODENOSCOPY (EGD) WITH PROPOFOL  N/A 12/28/2015   Procedure: ESOPHAGOGASTRODUODENOSCOPY (EGD) WITH PROPOFOL ;  Surgeon: Gladis RAYMOND Mariner, MD;  Location[facility] ENDOSCOPY;  Service: Endoscopy;  Laterality: N/A;   ESOPHAGOGASTRODUODENOSCOPY (EGD) WITH PROPOFOL  N/A 06/16/2016   Procedure: ESOPHAGOGASTRODUODENOSCOPY (EGD) WITH PROPOFOL ;  Surgeon: Gladis RAYMOND Mariner, MD;  Location: [facility] ENDOSCOPY;  Service: Endoscopy;  Laterality: N/A;   ESOPHAGOGASTRODUODENOSCOPY (EGD) WITH PROPOFOL  N/A 05/07/2019   Procedure: ESOPHAGOGASTRODUODENOSCOPY (EGD) WITH PROPOFOL ;  Surgeon: Dessa Reyes ORN, MD;  Location: [facility] ENDOSCOPY;  Service: Endoscopy;  Laterality: N/A;   EYE SURGERY  2013   CATARACT EXTRACTION   GANGLION CYST EXCISION Right 05/18/2017   Procedure: REMOVAL GANGLION CYST ANKLE;  Surgeon: Ashley Soulier, DPM;  Location: [facility] ORS;  Service: Podiatry;  Laterality: Right;   heart cath stent     LEFT HEART CATH AND CORONARY ANGIOGRAPHY Right 12/03/2017   Procedure: Left Heart Cath and Coronary Angiography with possible coronary intervention;  Surgeon: Fernand Denyse LABOR, MD;  Location: [facility] INVASIVE CV LAB;  Service: Cardiovascular;  Laterality: Right;    LEFT HEART CATH AND CORONARY ANGIOGRAPHY Left 07/27/2023   Procedure: LEFT HEART CATH AND CORONARY ANGIOGRAPHY;  Surgeon: Darron Deatrice LABOR, MD;  Location: [facility] INVASIVE CV LAB;  Service: Cardiovascular;  Laterality: Left;   PACEMAKER IMPLANT N/A 04/04/2021   Procedure: PACEMAKER IMPLANT;  Surgeon: Fernande Elspeth BROCKS, MD;  Location: [facility] INVASIVE CV LAB;  Service: Cardiovascular;  Laterality: N/A;   PROSTATE BIOPSY     RIGHT/LEFT HEART CATH AND CORONARY ANGIOGRAPHY N/A 02/07/2021   Procedure: RIGHT/LEFT HEART CATH AND CORONARY ANGIOGRAPHY;  Surgeon:  Darron Deatrice LABOR, MD;  Location: [facility] INVASIVE CV LAB;  Service: Cardiovascular;  Laterality: N/A;   stents     multiple    Current Medications: Current Meds  Medication Sig   albuterol  (VENTOLIN  HFA) 108 (90 Base) MCG/ACT inhaler Inhale into the lungs every 6 (six) hours as needed for wheezing or shortness of breath.   apixaban  (ELIQUIS ) 5 MG TABS tablet Take 1 tablet (5 mg total) by mouth 2 (two) times daily.   budesonide -formoterol  (SYMBICORT ) 160-4.5 MCG/ACT inhaler Inhale 2 puffs into the lungs 2 (two) times daily.   Calcium  Carb-Cholecalciferol  (CALCIUM  + D3 PO) Take 1 tablet by mouth daily.   cholecalciferol  (VITAMIN D3) 25 MCG (1000 UNIT) tablet Take 1,000 Units by mouth daily.   cyanocobalamin  (VITAMIN B12) 1000 MCG tablet Take 1,000 mcg by mouth daily.   Fe Bisgly-Vit C-Vit B12-FA (GENTLE IRON  PO) Take 28 mg by mouth daily.   furosemide  (LASIX ) 40 MG tablet Take 1 tablet (40 mg total) by mouth every Monday, Wednesday, and Friday.   isosorbide  mononitrate (IMDUR ) 30 MG 24 hr tablet Take 1 tablet (30 mg total) by mouth daily.   Lacosamide (VIMPAT) 100 MG TABS Take 100 mg by mouth in the morning and at bedtime.   lovastatin  (MEVACOR ) 40 MG tablet Take 2 tablets (80 mg total) by mouth at bedtime.   metoprolol  succinate (TOPROL -XL) 25 MG 24 hr tablet Take 1 tablet (25 mg total) by mouth daily.   Multiple Vitamin (MULTI-VITAMINS) TABS Take 1  tablet by mouth daily.    pantoprazole  (PROTONIX ) 40 MG tablet Take 40 mg by mouth 2 (two) times daily.    [DISCONTINUED] losartan  (COZAAR ) 25 MG tablet Take 1 tablet (25 mg total) by mouth daily.    Allergies:   Atorvastatin   Social History   Socioeconomic History   Marital status: Married    Spouse name: Glenda   Number of children: 2   Years of education: Not on file   Highest education level: Not on file  Occupational History   Occupation: retired production designer, theatre/television/film for [employer]  Tobacco Use   Smoking status: Former    Current packs/day: 0.00    Average packs/day: 1 pack/day for 35.0 years (35.0 ttl pk-yrs)    Types: Cigarettes    Start date: 03/07/1951    Quit date: 03/06/1986    Years since quitting: 37.9   Smokeless tobacco: Former    Types: Chew    Quit date: 1985   Tobacco comments:    Started smoking around 88 yrs old.    Smoked 1 PPD at his heaviest.  Vaping Use   Vaping status: Never Used  Substance and Sexual Activity   Alcohol use: No   Drug use: No   Sexual activity: Not Currently  Other Topics Concern   Not on file  Social History Narrative   Married, Gaetana 2 step children and 2 children from previous marriage   Social Drivers of Corporate Investment Banker Strain: Low Risk  (10/31/2023)   Overall Financial Resource Strain (CARDIA)    Difficulty of Paying Living Expenses: Not hard at all  Food Insecurity: No Food Insecurity (10/31/2023)   Hunger Vital Sign    Worried About Running Out of Food in the Last Year: Never true    Ran Out of Food in the Last Year: Never true  Transportation Needs: No Transportation Needs (10/31/2023)   PRAPARE - Transportation    Lack of Transportation (Medical): No    Lack of  Transportation (Non-Medical): No  Physical Activity: Insufficiently Active (10/31/2023)   Exercise Vital Sign    Days of Exercise per Week: 4 days    Minutes of Exercise per Session: 10 min  Stress: No Stress Concern Present (10/31/2023)   [employer] Occupational [employer] - Occupational Stress Questionnaire    Feeling of Stress: Not at all  Social Connections: Socially Integrated (10/31/2023)   Social Connection and Isolation Panel    Frequency of Communication with Friends and Family: More than three times a week    Frequency of Social Gatherings with Friends and Family: More than three times a week    Attends Religious Services: More than 4 times per year    Active Member of [employer] or Organizations: Yes    Attends Engineer, Structural: More than 4 times per year    Marital Status: Married     Family History:  The patient's family history includes Cancer in his brother, brother, daughter, father, mother, sister, and another family member; Stroke in his father.  ROS:   12-point review of systems is negative unless otherwise noted in the HPI.   EKGs/Labs/Other Studies Reviewed:    Studies reviewed were summarized above. The additional studies were reviewed today:  2D echo 08/10/2023: 1. Left ventricular ejection fraction, by estimation, is 55 to 60%. The  left ventricle has normal function. The left ventricle has no regional  wall motion abnormalities. Left ventricular diastolic parameters are  consistent with Grade I diastolic  dysfunction (impaired relaxation).   2. Right ventricular systolic function is normal. The right ventricular  size is normal. Tricuspid regurgitation signal is inadequate for assessing  PA pressure.   3. Right atrial size was mildly dilated.   4. The mitral valve is degenerative. Mild mitral valve regurgitation. No  evidence of mitral stenosis.   5. The aortic valve was not well visualized. Aortic valve regurgitation  is not visualized. No aortic stenosis is present.  __________   [facility] 07/27/2023:   Prox Cx to Mid Cx lesion is 10% stenosed.   Mid Cx lesion is 30% stenosed.   Mid RCA to Dist RCA lesion is 5% stenosed.   Prox RCA lesion is 20% stenosed.   Ost Cx to Prox Cx lesion is  30% stenosed.   1.  Patent RCA and left circumflex stents with minimal restenosis.  Mild nonobstructive disease overall. 2.  I could not cross the aortic valve due to significant tortuosity of the innominate artery.   Recommendations: Continue medical therapy. Obtain an echocardiogram to evaluate systolic and diastolic function. Avoid catheterization via the right radial artery in the future due to significant tortuosity and calcification of the innominate artery. __________   Myocardial PET/CT 07/12/2023:   LV perfusion is abnormal. There is evidence of ischemia. There is no evidence of infarction. Defect 1: There is a medium defect with mild reduction in uptake present in the mid to basal anterolateral and inferolateral location(s) that is reversible. There is normal wall motion in the defect area. Consistent with ischemia.   End diastolic cavity size is normal. End systolic cavity size is normal.   Myocardial blood flow was computed to be 0.45ml/g/min at rest and 1.28ml/g/min at stress. Global myocardial blood flow reserve was 1.68 and was abnormal.   Coronary calcium  assessment not performed due to prior revascularization. Prior stents   Findings are consistent with ischemia. The study is intermediate risk.   Ischemia noted in the mid-basal inferiorlateral/anterolateral walls MBFR abnormal in this  area 1.6 __________   Zio patch 02/07/2021: Patient had a min HR of 42 bpm, max HR of 203 bpm, and avg HR of 58 bpm.  Predominant underlying rhythm was Sinus Rhythm.Slight P wave morphology changes were noted.1 run of Ventricular Tachycardia occurred lasting 6 beats with a max rate of 152 bpm (avg 138 bpm).  103 Supraventricular Tachycardia runs occurred, the run with the fastest interval lasting 6 beats with a max rate of 203 bpm, the longest lasting 16.7 secs with an avg rate of 93 bpm. Atrial Fibrillation occurred (<1% burden), ranging from  103-167 bpm (avg of 133 bpm), the longest lasting 5  mins 51 secs with an avg rate of 139 bpm. Junctional Rhythm was present. Atrial Fibrillation and Junctional Rhythm were detected within +/- 45 seconds of symptomatic patient event(s).  Rare PACs and occasional PVCs. __________   [facility] 02/07/2021:   Prox Cx to Mid Cx lesion is 10% stenosed.   Mid Cx lesion is 30% stenosed.   Mid RCA to Dist RCA lesion is 5% stenosed.   The left ventricular systolic function is normal.   LV end diastolic pressure is mildly elevated.   The left ventricular ejection fraction is 55-65% by visual estimate.   1.  Patent stents in the left circumflex and right coronary artery with no evidence of obstructive coronary artery disease. 2.  Right heart catheterization showed mildly elevated filling pressures, mild pulmonary hypertension and normal cardiac output. 3.  Normal left ventricular systolic function.   Recommendations: No culprit is identified for the patient's chest pain.  Suspect possible GI etiology.  The patient is mildly volume overloaded likely due to some degree of diastolic heart failure.  A small dose diuretic can be considered.   The patient was noted to have intermittent bradycardia during cardiac cath with a heart rate down to the low 40s.  He reports recent episodes of dizziness and presyncope and thus we will obtain a 2-week outpatient monitor to evaluate the need for a pacemaker. __________   2D echo 05/26/2019: 1. Left ventricular ejection fraction, by estimation, is 55 to 60%. The  left ventricle has normal function. The left ventricle has no regional  wall motion abnormalities. There is mild left ventricular hypertrophy.  Left ventricular diastolic parameters  were normal.   2. Right ventricular systolic function is normal. The right ventricular  size is normal. There is normal pulmonary artery systolic pressure.   3. Left atrial size was mildly dilated.   4. The mitral valve is normal in structure. Trivial mitral valve  regurgitation. No  evidence of mitral stenosis.   5. The aortic valve is normal in structure. Aortic valve regurgitation is  not visualized. Mild aortic valve sclerosis is present, with no evidence  of aortic valve stenosis.   6. Aortic dilatation noted. There is mild dilatation of the ascending  aorta measuring 39 mm.   7. The inferior vena cava is normal in size with greater than 50%  respiratory variability, suggesting right atrial pressure of 3 mmHg.   Comparison(s): Prior Echo showed LV EF 55%, no RWMA, grade I diastolic  dysfunction, and mild LAE. Mild MR/TR.  __________   LHC 12/03/2017 [place]): Ost 1st Mrg to 1st Mrg lesion is 40% stenosed. Prox LAD lesion is 30% stenosed. Prox RCA to Mid RCA lesion is 30% stenosed. Dist RCA lesion is 20% stenosed. Ost Cx to Prox Cx lesion is 30% stenosed.   No significant CAD with normal LVEF, but Heart rate during  cath was 48, and will decrease amiodrone to 200 daily after holding for few days and get holter monitor. __________   2D echo 11/27/2017: - Left ventricle: The cavity size was normal. There was mild    concentric hypertrophy. Systolic function was normal. The    estimated ejection fraction was 55%. Wall motion was normal;    there were no regional wall motion abnormalities. Doppler    parameters are consistent with abnormal left ventricular    relaxation (grade 1 diastolic dysfunction).  - Aortic valve: Valve area (Vmax): 2.1 cm^2.  - Left atrium: The atrium was mildly dilated.  __________   See CV Studies in Epic for more remote cardiac imaging   EKG:  EKG is ordered today.  The EKG ordered today demonstrates atrial paced rhythm, 77 bpm  Recent Labs: 05/16/2023: TSH 2.19 12/28/2023: ALT 11 01/02/2024: BUN 21; Creatinine, Ser 1.32; Hemoglobin 12.7; Magnesium 2.0; Platelets 196; Potassium 4.1; Sodium 139  Recent Lipid Panel    Component Value Date/Time   CHOL 129 12/28/2023 0737   CHOL 103 09/16/2013 0438   TRIG 121.0 12/28/2023 0737    TRIG 95 09/16/2013 0438   HDL 35.30 (L) 12/28/2023 0737   HDL 30 (L) 09/16/2013 0438   CHOLHDL 4 12/28/2023 0737   VLDL 24.2 12/28/2023 0737   VLDL 19 09/16/2013 0438   LDLCALC 70 12/28/2023 0737   LDLCALC 54 09/16/2013 0438    PHYSICAL EXAM:    VS:  BP 122/68 (BP Location: Left Arm, Patient Position: Sitting, Cuff Size: Normal)   Pulse 77   Ht 5' 10 (1.778 m)   Wt 215 lb 9.6 oz (97.8 kg)   SpO2 98%   BMI 30.94 kg/m   BMI: Body mass index is 30.94 kg/m.  Physical Exam Constitutional:      Appearance: He is well-developed.  HENT:     Head: Normocephalic and atraumatic.  Eyes:     General:        Right eye: No discharge.        Left eye: No discharge.  Cardiovascular:     Rate and Rhythm: Normal rate and regular rhythm.     Heart sounds: Normal heart sounds, S1 normal and S2 normal. Heart sounds not distant. No midsystolic click and no opening snap. No murmur heard.    No friction rub.  Pulmonary:     Effort: Pulmonary effort is normal. No respiratory distress.     Breath sounds: Normal breath sounds. No decreased breath sounds, wheezing, rhonchi or rales.  Musculoskeletal:     Cervical back: Normal range of motion.     Comments: Trivial bilateral pretibial edema.  Skin:    General: Skin is warm and dry.     Nails: There is no clubbing.  Neurological:     Mental Status: He is alert and oriented to person, place, and time.  Psychiatric:        Speech: Speech normal.        Behavior: Behavior normal.        Thought Content: Thought content normal.        Judgment: Judgment normal.     Wt Readings from Last 3 Encounters:  01/23/24 215 lb 9.6 oz (97.8 kg)  01/21/24 211 lb 12.8 oz (96.1 kg)  01/14/24 216 lb (98 kg)     ASSESSMENT & PLAN:   CAD involving the native coronary arteries without angina with mildly elevated high-sensitivity troponin: He is without symptoms of angina or cardiac decompensation.  Recent LHC in 07/2023 showed patent LCx and RCA stents with  otherwise nonobstructive disease.  Mildly elevated and flat trending high-sensitivity troponin secondary to supply/demand ischemia with A-fib with RVR and underlying renal dysfunction.  No plans for further ischemic testing at this time.  He is on apixaban  in lieu of aspirin .  Otherwise remains on Imdur  30 mg and lovastatin  40 mg.  HFpEF: Volume status is difficult to assess on physical exam second to body habitus, though he appears largely euvolemic.  His weight is down 6 pounds from his last visit on this side of the office with same scale at that time showing a weight of 221 pounds.  He remains on furosemide  40 mg Monday, Wednesday, and Fridays.  Not currently on MRA with underlying renal dysfunction.  Could consider addition of SGLT2 inhibitor, though would need to be mindful of off target effect and this may exacerbate some of his positional dizziness.  A-fib/flutter: Recently seen in PCP's office and transferred to the ED for recurrent symptomatic atrial flutter with RVR requiring DCCV in the ER.  Currently, A-paced rhythm.  Device interrogation earlier this month showed an increase in overall A-fib/flutter burden at 7.3%.  Remains on Toprol -XL 25 mg.  CHADS2VASc at least 7 (CHF, HTN, age x 2, TIA x 2, vascular disease).  Remains on apixaban  5 mg twice daily which should be continued indefinitely.  No falls or symptoms concerning for bleeding.  Recent labs stable.  Refer back to EP for ongoing management of symptomatic A-fib/flutter requiring recent DCCV with increased burden.  Symptomatic bradycardia: Status post PPM.  Follow-up with EP.  HTN: Blood pressure is well-controlled in the office today.  He remains on Imdur  30 mg, losartan  25 mg, and Toprol -XL 25 mg.  HLD: LDL 70 in 12/2023 with normal AST/ALT at that time.  Remains on lovastatin  40 mg.  AAA/popliteal artery aneurysm: Duplex in 01/2024 showed infrarenal abdominal aorta measuring 2.8 cm.  Imaging demonstrated a popliteal artery aneurysm  measuring less than 2.5 cm.  Followed by vascular surgery.  Carotid artery stenosis: Duplex showed 1 to 39% bilateral ICA stenosis 01/2023.  On apixaban  in lieu of aspirin .  Remains on lovastatin .  Followed by vascular surgery.  CKD stage III: Trend BMP.     Disposition: F/u with Dr. Darron or an APP in 2 months, and schedule appointment with EP.   Medication Adjustments/Labs and Tests Ordered: Current medicines are reviewed at length with the patient today.  Concerns regarding medicines are outlined above. Medication changes, Labs and Tests ordered today are summarized above and listed in the Patient Instructions accessible in Encounters.   Signed, Bernardino Bring, PA-C 01/23/2024 3:06 PM      [facility] [address] [place], [place] [address] [phone]

## 2024-01-20 ENCOUNTER — Ambulatory Visit: Payer: Self-pay | Admitting: Cardiology

## 2024-01-21 ENCOUNTER — Encounter (INDEPENDENT_AMBULATORY_CARE_PROVIDER_SITE_OTHER): Payer: Self-pay | Admitting: Vascular Surgery

## 2024-01-21 ENCOUNTER — Ambulatory Visit (INDEPENDENT_AMBULATORY_CARE_PROVIDER_SITE_OTHER): Payer: Medicare HMO

## 2024-01-21 ENCOUNTER — Ambulatory Visit (INDEPENDENT_AMBULATORY_CARE_PROVIDER_SITE_OTHER): Payer: Medicare HMO | Admitting: Vascular Surgery

## 2024-01-21 VITALS — BP 144/82 | HR 74 | Resp 18 | Ht 70.0 in | Wt 211.8 lb

## 2024-01-21 DIAGNOSIS — I724 Aneurysm of artery of lower extremity: Secondary | ICD-10-CM

## 2024-01-21 DIAGNOSIS — I739 Peripheral vascular disease, unspecified: Secondary | ICD-10-CM

## 2024-01-21 DIAGNOSIS — I714 Abdominal aortic aneurysm, without rupture, unspecified: Secondary | ICD-10-CM | POA: Diagnosis not present

## 2024-01-21 DIAGNOSIS — I4891 Unspecified atrial fibrillation: Secondary | ICD-10-CM

## 2024-01-21 DIAGNOSIS — I6523 Occlusion and stenosis of bilateral carotid arteries: Secondary | ICD-10-CM

## 2024-01-21 DIAGNOSIS — I10 Essential (primary) hypertension: Secondary | ICD-10-CM

## 2024-01-21 NOTE — Progress Notes (Unsigned)
 MRN : [id number]  Nathaniel Thompson is a 88 y.o. (04/28/1935) male who presents with chief complaint of check circulation.  History of Present Illness:   The patient returns to the office for follow-up regarding an abdominal aortic aneurysm as well as atherosclerotic occlusive disease bilaterally.  The aneurysm was found incidentally by plain films for low back pain. Patient denies abdominal pain or unusual back pain, no other abdominal complaints.  He does have a bony met from his prostate cancer at L3.  No history of an acute onset of painful blue discoloration of the toes.      No family history of AAA.    Patient denies amaurosis fugax or TIA symptoms. There is no history of claudication or rest pain symptoms of the lower extremities.  He does have pain in the right leg when he walks but it radiates from posterior to laterally down the leg and occurs with laying down in bed tooThe patient denies angina or shortness of breath.   Previous duplex ultrasound of the abdominal aorta obtained today demonstrates the infrarenal abdominal aorta is 3.1 cm.  The aortic duplex ultrasound dated 01/22/2023 demonstrated a infrarenal abdominal aortic aneurysm with a maximal diameter of 2.8 cm   Carotid duplex obtained today demonstrates 1 to 39% bilaterally internal carotid artery stenosis.  There is no significant change on today's study compared to previous.   Duplex ultrasound dated 01/21/2024 of the bilateral popliteal arteries demonstrate right popliteal artery aneurysm measuring 1.2 cm and left popliteal artery aneurysm measuring 1.1 cm.  Triphasic flow is noted bilaterally.   Current Meds  Medication Sig   albuterol  (VENTOLIN  HFA) 108 (90 Base) MCG/ACT inhaler Inhale into the lungs every 6 (six) hours as needed for wheezing or shortness of breath.   apixaban  (ELIQUIS ) 5 MG TABS tablet Take 1 tablet (5 mg total) by mouth 2 (two)  times daily.   budesonide -formoterol  (SYMBICORT ) 160-4.5 MCG/ACT inhaler Inhale 2 puffs into the lungs 2 (two) times daily.   Calcium  Carb-Cholecalciferol  (CALCIUM  + D3 PO) Take 1 tablet by mouth daily.   cholecalciferol  (VITAMIN D3) 25 MCG (1000 UNIT) tablet Take 1,000 Units by mouth daily.   cyanocobalamin  (VITAMIN B12) 1000 MCG tablet Take 1,000 mcg by mouth daily.   Fe Bisgly-Vit C-Vit B12-FA (GENTLE IRON  PO) Take 28 mg by mouth daily.   furosemide  (LASIX ) 40 MG tablet Take 1 tablet (40 mg total) by mouth every Monday, Wednesday, and Friday.   isosorbide  mononitrate (IMDUR ) 30 MG 24 hr tablet Take 1 tablet (30 mg total) by mouth daily.   Lacosamide (VIMPAT) 100 MG TABS Take 100 mg by mouth in the morning and at bedtime.   losartan  (COZAAR ) 25 MG tablet Take 1 tablet (25 mg total) by mouth daily.   lovastatin  (MEVACOR ) 40 MG tablet Take 2 tablets (80 mg total) by mouth at bedtime.   metoprolol  succinate (TOPROL -XL) 25 MG 24 hr tablet Take 1 tablet (25 mg total) by mouth daily.   Multiple Vitamin (MULTI-VITAMINS) TABS Take 1 tablet by mouth daily.    pantoprazole  (PROTONIX ) 40 MG tablet Take 40  mg by mouth 2 (two) times daily.     Past Medical History:  Diagnosis Date   (HFpEF) heart failure with preserved ejection fraction (HCC)    a. 05/2018 Echo: Nl EF, Gr1 DD, sev dil LA, mild MR/TR, mild PAH; b. 05/2019 Echo: EF 55-60%, no rwma, mild LVH. Nl PASP. Mildly dil LA. Triv MR. Mild AoV sclerosis w/o stenosis. Mildly dil Asc AO (39mm); d. 02/2023 Echo [facility]): EF >55%, nl RV fxn, mild PR.   AAA (abdominal aortic aneurysm)    Allergic rhinitis    Barrett's esophagus 10/21/2013   Bone cancer (HCC)    Cardiac pacemaker in situ    CKD (chronic kidney disease), stage III (HCC)    Colon polyp, hyperplastic    COPD (chronic obstructive pulmonary disease) (HCC)    mild COPD. former smoker   Coronary artery disease    a. 2003 s/p PCI ([facility]); b. 2004 s/p PCI x 2 ([facility]); c. 11/2017 Cath: LM nl, LAD  mild dzs, LCX patent stent w/ 30% distal edge restenosis, RCA patent distal stent w/ 30% prox edge stenosis. Nl EF->Med Rx; d. 02/2021 Cath: LM nl, LAD min irregs, D1/2 min irregs, LCX 10p ISR, 104m, RCA 70m/d ISR, EF 65%; e. 07/2023 PET Stress: antlat and inflat ischemia.   Diverticulosis    Fundic gland polyps of stomach, benign    Gastritis    GERD (gastroesophageal reflux disease)    Hypercholesteremia    Hypertension    PAF (paroxysmal atrial fibrillation) (HCC)    a. CHA2DS2VASc = 7-->eliquis .   Prostate cancer (HCC)    Prostatism    Pulmonary embolism (HCC)    a. 2023 - lobar and segmental PE in setting of RSV.   Reflux esophagitis    Renal stones    Skin cancer    left cheek/lesion excised   Sleep apnea    Symptomatic bradycardia    Tachy-brady syndrome (HCC)    a. 03/2021 s/p MDT MRI compatible DC PPM (ser # [id number]).   Tachycardia    a. 11/2017 admit w/ tachycardia/? afib-->no afib noted upon review (amio/OAC d/c'd).   TIA (transient ischemic attack)    a. 02/2023 admit [facility] - no stroke on MRI; b. 06/2022 EEG: nl for age.    Past Surgical History:  Procedure Laterality Date   CARDIAC CATHETERIZATION     COLONOSCOPY WITH PROPOFOL  N/A 09/13/2015   Procedure: COLONOSCOPY WITH PROPOFOL ;  Surgeon: Gladis RAYMOND Mariner, MD;  Location: [facility] ENDOSCOPY;  Service: Endoscopy;  Laterality: N/A;   COLONOSCOPY WITH PROPOFOL  N/A 05/07/2019   Procedure: COLONOSCOPY WITH PROPOFOL ;  Surgeon: Dessa Reyes ORN, MD;  Location: [facility] ENDOSCOPY;  Service: Endoscopy;  Laterality: N/A;   CORONARY ANGIOPLASTY     ESOPHAGOGASTRODUODENOSCOPY (EGD) WITH PROPOFOL  N/A 09/13/2015   Procedure: ESOPHAGOGASTRODUODENOSCOPY (EGD) WITH PROPOFOL ;  Surgeon: Gladis RAYMOND Mariner, MD;  Location: [facility] ENDOSCOPY;  Service: Endoscopy;  Laterality: N/A;   ESOPHAGOGASTRODUODENOSCOPY (EGD) WITH PROPOFOL  N/A 12/28/2015   Procedure: ESOPHAGOGASTRODUODENOSCOPY (EGD) WITH PROPOFOL ;  Surgeon: Gladis RAYMOND Mariner, MD;  Location: [facility]  ENDOSCOPY;  Service: Endoscopy;  Laterality: N/A;   ESOPHAGOGASTRODUODENOSCOPY (EGD) WITH PROPOFOL  N/A 06/16/2016   Procedure: ESOPHAGOGASTRODUODENOSCOPY (EGD) WITH PROPOFOL ;  Surgeon: Gladis RAYMOND Mariner, MD;  Location: [facility] ENDOSCOPY;  Service: Endoscopy;  Laterality: N/A;   ESOPHAGOGASTRODUODENOSCOPY (EGD) WITH PROPOFOL  N/A 05/07/2019   Procedure: ESOPHAGOGASTRODUODENOSCOPY (EGD) WITH PROPOFOL ;  Surgeon: Dessa Reyes ORN, MD;  Location: [facility] ENDOSCOPY;  Service: Endoscopy;  Laterality: N/A;   EYE SURGERY  2013   CATARACT EXTRACTION  GANGLION CYST EXCISION Right 05/18/2017   Procedure: REMOVAL GANGLION CYST ANKLE;  Surgeon: Ashley Soulier, DPM;  Location: [facility] ORS;  Service: Podiatry;  Laterality: Right;   heart cath stent     LEFT HEART CATH AND CORONARY ANGIOGRAPHY Right 12/03/2017   Procedure: Left Heart Cath and Coronary Angiography with possible coronary intervention;  Surgeon: Fernand Denyse LABOR, MD;  Location: [facility] INVASIVE CV LAB;  Service: Cardiovascular;  Laterality: Right;   LEFT HEART CATH AND CORONARY ANGIOGRAPHY Left 07/27/2023   Procedure: LEFT HEART CATH AND CORONARY ANGIOGRAPHY;  Surgeon: Darron Deatrice LABOR, MD;  Location: [facility] INVASIVE CV LAB;  Service: Cardiovascular;  Laterality: Left;   PACEMAKER IMPLANT N/A 04/04/2021   Procedure: PACEMAKER IMPLANT;  Surgeon: Fernande Elspeth BROCKS, MD;  Location: [facility] INVASIVE CV LAB;  Service: Cardiovascular;  Laterality: N/A;   PROSTATE BIOPSY     RIGHT/LEFT HEART CATH AND CORONARY ANGIOGRAPHY N/A 02/07/2021   Procedure: RIGHT/LEFT HEART CATH AND CORONARY ANGIOGRAPHY;  Surgeon: Darron Deatrice LABOR, MD;  Location: [facility] INVASIVE CV LAB;  Service: Cardiovascular;  Laterality: N/A;   stents     multiple    Social History Social History   Tobacco Use   Smoking status: Former    Current packs/day: 0.00    Average packs/day: 1 pack/day for 35.0 years (35.0 ttl pk-yrs)    Types: Cigarettes    Start date: 03/07/1951    Quit date: 03/06/1986    Years since  quitting: 37.9   Smokeless tobacco: Former    Types: Chew    Quit date: 1985   Tobacco comments:    Started smoking around 88 yrs old.    Smoked 1 PPD at his heaviest.  Vaping Use   Vaping status: Never Used  Substance Use Topics   Alcohol use: No   Drug use: No    Family History Family History  Problem Relation Age of Onset   Cancer Mother        gastric and lung   Cancer Father        multiple myeloma   Stroke Father    Cancer Sister        leukemia   Cancer Brother        leukemia   Cancer Brother        kidney   Cancer Daughter        Uterine   Cancer Other        Nephes (Sister's Son): Prostate    Allergies  Allergen Reactions   Atorvastatin Other (See Comments)    Achy joints     REVIEW OF SYSTEMS (Negative unless checked)  Constitutional: [] Weight loss  [] Fever  [] Chills Cardiac: [] Chest pain   [] Chest pressure   [] Palpitations   [] Shortness of breath when laying flat   [] Shortness of breath with exertion. Vascular:  [x] Pain in legs with walking   [] Pain in legs at rest  [] History of DVT   [] Phlebitis   [] Swelling in legs   [] Varicose veins   [] Non-healing ulcers Pulmonary:   [] Uses home oxygen   [] Productive cough   [] Hemoptysis   [] Wheeze  [] COPD   [] Asthma Neurologic:  [] Dizziness   [] Seizures   [] History of stroke   [] History of TIA  [] Aphasia   [] Vissual changes   [] Weakness or numbness in arm   [] Weakness or numbness in leg Musculoskeletal:   [] Joint swelling   [] Joint pain   [] Low back pain Hematologic:  [] Easy bruising  [] Easy bleeding   [] Hypercoagulable state   []   Anemic Gastrointestinal:  [] Diarrhea   [] Vomiting  [] Gastroesophageal reflux/heartburn   [] Difficulty swallowing. Genitourinary:  [] Chronic kidney disease   [] Difficult urination  [] Frequent urination   [] Blood in urine Skin:  [] Rashes   [] Ulcers  Psychological:  [] History of anxiety   []  History of major depression.  Physical Examination  Vitals:   01/21/24 0829  BP: (!) 144/82   Pulse: 74  Resp: 18  Weight: 211 lb 12.8 oz (96.1 kg)  Height: 5' 10 (1.778 m)   Body mass index is 30.39 kg/m. Gen: WD/WN, NAD Head: [place]/AT, No temporalis wasting.  Ear/Nose/Throat: Hearing grossly intact, nares w/o erythema or drainage Eyes: PER, EOMI, sclera nonicteric.  Neck: Supple, no masses.  No bruit or JVD.  Pulmonary:  Good air movement, no audible wheezing, no use of accessory muscles.  Cardiac: RRR, normal S1, S2, no Murmurs. Vascular:  mild trophic changes, no open wounds Vessel Right Left  Radial Palpable Palpable  PT Not Palpable Not Palpable  DP Not Palpable Not Palpable  Gastrointestinal: soft, non-distended. No guarding/no peritoneal signs.  Musculoskeletal: M/S 5/5 throughout.  No visible deformity.  Neurologic: CN 2-12 intact. Pain and light touch intact in extremities.  Symmetrical.  Speech is fluent. Motor exam as listed above. Psychiatric: Judgment intact, Mood & affect appropriate for pt's clinical situation. Dermatologic: No rashes or ulcers noted.  No changes consistent with cellulitis.   CBC Lab Results  Component Value Date   WBC 5.9 01/02/2024   HGB 12.7 (L) 01/02/2024   HCT 38.7 (L) 01/02/2024   MCV 93.7 01/02/2024   PLT 196 01/02/2024    BMET    Component Value Date/Time   NA 139 01/02/2024 1151   NA 139 10/10/2023 0808   NA 140 09/16/2013 0438   K 4.1 01/02/2024 1151   K 4.2 09/16/2013 0438   CL 107 01/02/2024 1151   CL 109 (H) 09/16/2013 0438   CO2 22 01/02/2024 1151   CO2 24 09/16/2013 0438   GLUCOSE 97 01/02/2024 1151   GLUCOSE 101 (H) 09/16/2013 0438   BUN 21 01/02/2024 1151   BUN 20 10/10/2023 0808   BUN 20 (H) 09/16/2013 0438   CREATININE 1.32 (H) 01/02/2024 1151   CREATININE 1.16 11/12/2023 0913   CREATININE 1.28 09/16/2013 0438   CALCIUM  9.3 01/02/2024 1151   CALCIUM  8.3 (L) 09/16/2013 0438   GFRNONAA 53 (L) 01/02/2024 1151   GFRNONAA >60 11/12/2023 0913   GFRNONAA 54 (L) 09/16/2013 0438   GFRAA 59 (L) 10/07/2019  1305   GFRAA >60 09/16/2013 0438   Estimated Creatinine Clearance: 46.7 mL/min (A) (by C-G formula based on SCr of 1.32 mg/dL (H)).  COAG Lab Results  Component Value Date   INR 1.2 02/16/2023    Radiology CUP PACEART REMOTE DEVICE CHECK Result Date: 01/08/2024 Pacemaker: Scheduled remote reviewed. Normal device function.  Presenting rhythm: AP/VS Known PAF, Eliquis  per EPIC EMR, AF burden 7.3%.  3 fast A&V events, appears to be AT Next remote transmission per protocol. ML, CVRS  DG Chest Port 1 View Result Date: 01/02/2024 EXAM: 1 VIEW(S) XRAY OF THE CHEST 01/02/2024 12:40:21 PM COMPARISON: 11/16/2023 CLINICAL HISTORY: [id number] Chest pain [id number]. Chest pain Chest pain FINDINGS: LUNGS AND PLEURA: Stable calcified granuloma in the right upper lobe. No pulmonary edema. No pleural effusion. No pneumothorax. HEART AND MEDIASTINUM: Mild cardiomegaly. Aortic atherosclerosis. BONES AND SOFT TISSUES: No acute osseous abnormality. IMPRESSION: 1. No acute cardiopulmonary process. 2. Mild cardiomegaly. Electronically signed by: Franky Crease MD 01/02/2024 02:06 PM EDT  RP Workstation: [id number]     Assessment/Plan 1. Popliteal artery aneurysm (Primary) Recommend: No surgery or intervention is indicated at this time.   The patient has an asymptomatic popliteal artery aneurysm that is less than 2.5 cm in maximal diameter.  I have discussed the natural history of popliteal aneurysm and the small risk of thrombosis for aneurysm less than 2.5 cm in size.  However, as these small aneurysms tend to enlarge over time, continued surveillance with ultrasound is mandatory.   I have also discussed optimizing medical management with hypertension and lipid control and the negative effect that any tobacco products have on aneurysmal disease.  The patient is also encouraged to exercise a minimum of 30 minutes 4 times a week.    Should the patient develop new leg pain or signs of peripheral embolization they are  instructed to seek medical attention immediately and to alert the physician providing care that they have an aneurysm behind the knee.   The patient voices their understanding.   The patient will follow up as scheduled with duplex ultrasound of the lower extremities for surveillance.  2. PAD (peripheral artery disease) Recommend:  I do not find evidence of life style limiting vascular disease. The patient specifically denies life style limitation.  Previous noninvasive studies including ABI's of the legs do not identify critical vascular problems.  The patient should continue walking and begin a more formal exercise program. The patient should continue his antiplatelet therapy and aggressive treatment of the lipid abnormalities.  The patient is instructed to call the office if there is a significant change in the lower extremity symptoms, particularly if a wound develops or there is an abrupt increase in leg pain.  - VAS US  ABI WITH/WO TBI; Future  3. Abdominal aortic aneurysm (AAA) without rupture, unspecified part Recommend: No surgery or intervention is indicated at this time.   The patient has an asymptomatic abdominal aortic aneurysm that is less than 4 cm in maximal diameter.     I have reviewed the natural history of abdominal aortic aneurysm and the small risk of rupture for aneurysm less than 5 cm in size.   However, as these small aneurysms tend to enlarge over time, continued surveillance with ultrasound or CT scan is mandatory.    I have also discussed optimizing medical management with hypertension and lipid control and the negative effect that any tobacco products have on aneurysmal disease.  The patient is also encouraged to exercise a minimum of 30 minutes 4 times a week.    Should the patient develop new onset abdominal or back pain or signs of peripheral embolization they are instructed to seek medical attention immediately and to alert the physician providing care  that they have an aneurysm.    The patient voices their understanding.   The patient will return in 12 months with an aortic duplex. - VAS US  AORTA/IVC/ILIACS; Future  4. Bilateral carotid artery stenosis Recommend:   Given the patient's asymptomatic subcritical stenosis no further invasive testing or surgery at this time.   Duplex ultrasound shows 1-39% ICA stenosis bilaterally.   Continue antiplatelet therapy as prescribed Continue management of CAD, HTN and Hyperlipidemia Healthy heart diet,  encouraged exercise at least 4 times per week   Follow up in 24-36 months with duplex ultrasound and physical exam   5. Atrial fibrillation, unspecified type (HCC) Continue antiarrhythmia medications as already ordered, these medications have been reviewed and there are no changes at this time.  Continue anticoagulation as  ordered by Cardiology Service  6. Hypertension, essential Continue antihypertensive medications as already ordered, these medications have been reviewed and there are no changes at this time.    Cordella Shawl, MD  01/21/2024 8:49 AM

## 2024-01-23 ENCOUNTER — Ambulatory Visit: Attending: Physician Assistant | Admitting: Physician Assistant

## 2024-01-23 ENCOUNTER — Encounter: Payer: Self-pay | Admitting: Physician Assistant

## 2024-01-23 ENCOUNTER — Other Ambulatory Visit: Payer: Self-pay | Admitting: Cardiovascular Disease

## 2024-01-23 ENCOUNTER — Encounter (INDEPENDENT_AMBULATORY_CARE_PROVIDER_SITE_OTHER): Payer: Self-pay | Admitting: Vascular Surgery

## 2024-01-23 VITALS — BP 122/68 | HR 77 | Ht 70.0 in | Wt 215.6 lb

## 2024-01-23 DIAGNOSIS — N183 Chronic kidney disease, stage 3 unspecified: Secondary | ICD-10-CM

## 2024-01-23 DIAGNOSIS — I714 Abdominal aortic aneurysm, without rupture, unspecified: Secondary | ICD-10-CM

## 2024-01-23 DIAGNOSIS — I480 Paroxysmal atrial fibrillation: Secondary | ICD-10-CM

## 2024-01-23 DIAGNOSIS — I5032 Chronic diastolic (congestive) heart failure: Secondary | ICD-10-CM | POA: Diagnosis not present

## 2024-01-23 DIAGNOSIS — I739 Peripheral vascular disease, unspecified: Secondary | ICD-10-CM | POA: Insufficient documentation

## 2024-01-23 DIAGNOSIS — R7989 Other specified abnormal findings of blood chemistry: Secondary | ICD-10-CM | POA: Diagnosis not present

## 2024-01-23 DIAGNOSIS — I4892 Unspecified atrial flutter: Secondary | ICD-10-CM | POA: Diagnosis not present

## 2024-01-23 DIAGNOSIS — I4891 Unspecified atrial fibrillation: Secondary | ICD-10-CM

## 2024-01-23 DIAGNOSIS — E785 Hyperlipidemia, unspecified: Secondary | ICD-10-CM

## 2024-01-23 DIAGNOSIS — R001 Bradycardia, unspecified: Secondary | ICD-10-CM

## 2024-01-23 DIAGNOSIS — I2510 Atherosclerotic heart disease of native coronary artery without angina pectoris: Secondary | ICD-10-CM

## 2024-01-23 DIAGNOSIS — I10 Essential (primary) hypertension: Secondary | ICD-10-CM

## 2024-01-23 DIAGNOSIS — Z950 Presence of cardiac pacemaker: Secondary | ICD-10-CM

## 2024-01-23 MED ORDER — LOSARTAN POTASSIUM 25 MG PO TABS: 12.5000 mg | ORAL_TABLET | Freq: Every day | ORAL | 3 refills | Status: AC

## 2024-01-23 NOTE — Patient Instructions (Signed)
 Medication Instructions:  Your physician recommends the following medication changes.  DECREASE: Losartan  12.5 mg daily  *If you need a refill on your cardiac medications before your next appointment, please call your pharmacy*  Lab Work: Your provider would like for you to have following labs drawn today BMP.   If you have labs (blood work) drawn today and your tests are completely normal, you will receive your results only by: MyChart Message (if you have MyChart) OR A paper copy in the mail If you have any lab test that is abnormal or we need to change your treatment, we will call you to review the results.  Follow-Up: At [facility], you and your health needs are our priority.  As part of our continuing mission to provide you with exceptional heart care, our providers are all part of one team.  This team includes your primary Cardiologist (physician) and Advanced Practice Providers or APPs (Physician Assistants and Nurse Practitioners) who all work together to provide you with the care you need, when you need it.  Your next appointment:   2 month(s)  Provider:   You may see Deatrice Cage, MD or Bernardino Bring, PA-C  PLEASE schedule with Suzann for A FLutter

## 2024-01-23 NOTE — Telephone Encounter (Signed)
 Eliquis  5mg  refill request received. Patient is 88 years old, weight-97.8kg, Crea-1.32 on 01/02/24, Diagnosis-PAF, and last seen by Ryann Dunn on 01/23/24. Dose is appropriate based on dosing criteria. Will send in refill to requested pharmacy.

## 2024-01-24 ENCOUNTER — Ambulatory Visit: Payer: Self-pay | Admitting: Physician Assistant

## 2024-01-24 LAB — BASIC METABOLIC PANEL WITH GFR
BUN/Creatinine Ratio: 13 (ref 10–24)
BUN: 17 mg/dL (ref 8–27)
CO2: 23 mmol/L (ref 20–29)
Calcium: 9.6 mg/dL (ref 8.6–10.2)
Chloride: 104 mmol/L (ref 96–106)
Creatinine, Ser: 1.33 mg/dL — ABNORMAL HIGH (ref 0.76–1.27)
Glucose: 94 mg/dL (ref 70–99)
Potassium: 4.2 mmol/L (ref 3.5–5.2)
Sodium: 145 mmol/L — ABNORMAL HIGH (ref 134–144)
eGFR: 52 mL/min/1.73 — ABNORMAL LOW (ref >59)

## 2024-02-06 ENCOUNTER — Other Ambulatory Visit (HOSPITAL_COMMUNITY): Payer: Self-pay

## 2024-02-06 NOTE — Progress Notes (Unsigned)
 Electrophysiology Clinic Note    Date:  02/07/2024  Patient ID:  Toriano, Aikey 04/03/1935, MRN [id number] PCP:  Glendia Shad, MD  Cardiologist:  Deatrice Cage, MD   Electrophysiologist:  Fonda Kitty, MD  Electrophysiology APP:  Javon Hupfer, NP      Discussed the use of AI scribe software for clinical note transcription with the patient, who gave verbal consent to proceed.   Patient Profile    Chief Complaint: AF follow-up  History of Present Illness: Nathaniel Thompson is a 88 y.o. male with PMH notable for bradycardia s/p PPM, parox AFib, aflutter, PVCs, NSVT, CAD s/p PCI, HFpEF, orthostatic hypotension, CKD-3, HTN, TIA, OSA; seen today for Fonda Kitty, MD (previously Dr. Fernande) for EP follow-up.   I last saw him 10/2023 after DCCV. He was overall maintaining sinus rhythm with brief overnight aflutter episodes. Favored continue to monitor Afl burden at that time.   He presented to ER 10/29 in atrial flutter w RVR and was cardioverted in ER. He saw PA Dunn 11/19 in follow-up where he was maintaining sinus rhythm and referred back to EP for further mgmt of Aflutter.   On follow-up today, he feels well currently. He denies chest pain, chest pressure, or palpitations today. He is generally asymptomatic of his aflutter episodes, but if episode persists he has significant fatigue and feels as though he will pass out. He does not have palpitations during episodes.   He continues to take eliquis  BID, no bleeding concerns.   His wife joins for visit whom I have met previously.    Arrhythmia/Device History MDT PPM, imp 2023; dx SND    ROS:  Please see the history of present illness. All other systems are reviewed and otherwise negative.    Physical Exam    VS:  BP 132/72 (BP Location: Left Arm, Patient Position: Sitting, Cuff Size: Normal)   Pulse 82   Ht 5' 10 (1.778 m)   Wt 220 lb (99.8 kg)   SpO2 98%   BMI 31.57 kg/m  BMI: Body mass index is 31.57  kg/m.      Wt Readings from Last 3 Encounters:  02/07/24 220 lb (99.8 kg)  01/23/24 215 lb 9.6 oz (97.8 kg)  01/21/24 211 lb 12.8 oz (96.1 kg)     GEN- The patient is well appearing, alert and oriented x 3 today.   Lungs- Clear to ausculation bilaterally, normal work of breathing.  Heart- Regular rate and rhythm, no murmurs, rubs or gallops Extremities- Trace peripheral edema, warm, dry Skin-  device pocket well-healed, no tethering  Brief check performed without iterative lead testing/measurements   Studies Reviewed   Previous EP, cardiology notes.    EKG is not ordered. Personal review of EKG from 01/23/2024 shows:  AP at 77        TTE, 08/10/2023  1. Left ventricular ejection fraction, by estimation, is 55 to 60%. The left ventricle has normal function. The left ventricle has no regional wall motion abnormalities. Left ventricular diastolic parameters are consistent with Grade I diastolic dysfunction (impaired relaxation).   2. Right ventricular systolic function is normal. The right ventricular size is normal. Tricuspid regurgitation signal is inadequate for assessing PA pressure.   3. Right atrial size was mildly dilated.   4. The mitral valve is degenerative. Mild mitral valve regurgitation. No evidence of mitral stenosis.   5. The aortic valve was not well visualized. Aortic valve regurgitation is not visualized. No aortic stenosis is  present.   LHC, 07/27/2023   Prox Cx to Mid Cx lesion is 10% stenosed.   Mid Cx lesion is 30% stenosed.   Mid RCA to Dist RCA lesion is 5% stenosed.   Prox RCA lesion is 20% stenosed.   Ost Cx to Prox Cx lesion is 30% stenosed.   1.  Patent RCA and left circumflex stents with minimal restenosis.  Mild nonobstructive disease overall. 2.  I could not cross the aortic valve due to significant tortuosity of the innominate artery.  Cardiac PET, 07/12/2023   LV perfusion is abnormal. There is evidence of ischemia. There is no evidence of  infarction. Defect 1: There is a medium defect with mild reduction in uptake present in the mid to basal anterolateral and inferolateral location(s) that is reversible. There is normal wall motion in the defect area. Consistent with ischemia.   End diastolic cavity size is normal. End systolic cavity size is normal.   Myocardial blood flow was computed to be 0.30ml/g/min at rest and 1.28ml/g/min at stress. Global myocardial blood flow reserve was 1.68 and was abnormal.   Coronary calcium  assessment not performed due to prior revascularization. Prior stents   Findings are consistent with ischemia. The study is intermediate risk.   Ischemia noted in the mid-basal inferiorlateral/anterolateral walls MBFR abnormal in this area 1.6    Assessment and Plan     #) parox AFib #) Aflutter Patient is s/p ER visit x 2 for aflutter, both times requiring DCCV Recommended initiating AAD to prevent atrial arrhythmias, specifically multaq, tikosyn or amiodarone . Reviewed risks/benefits of each medication. Will start 400mg  multaq BID today Update thyroid  labs today Recent LFTs stable Continue to monitor AF burden via PPM  #) Hypercoag d/t afib CHA2DS2-VASc Score = at least 7 [CHF History: 1, HTN History: 1, Diabetes History: 0, Stroke History: 2, Vascular Disease History: 1, Age Score: 2, Gender Score: 0].  Therefore, the patient's annual risk of stroke is 11.2 %.    Stroke ppx - 5mg  eliquis  BID, appropriately dosed No significant bleeding concerns  #) SND s/p PPM Recent remote monitor with good battery, stable lead measurements      Current medicines are reviewed at length with the patient today.   The patient does not have concerns regarding his medicines.  The following changes were made today:  none  Labs/ tests ordered today include:  Orders Placed This Encounter  Procedures   TSH   T4     Disposition: Follow up with Dr. Kennyth or EP APP in 2-3 months , prefer MD to establish care after  Dr. Celine retirement    Signed, Chantal Needle, NP  02/07/24  5:12 PM  Electrophysiology [facility]

## 2024-02-07 ENCOUNTER — Ambulatory Visit: Attending: Cardiology | Admitting: Cardiology

## 2024-02-07 ENCOUNTER — Encounter: Payer: Self-pay | Admitting: Cardiology

## 2024-02-07 VITALS — BP 132/72 | HR 82 | Ht 70.0 in | Wt 220.0 lb

## 2024-02-07 DIAGNOSIS — Z79899 Other long term (current) drug therapy: Secondary | ICD-10-CM

## 2024-02-07 DIAGNOSIS — I4892 Unspecified atrial flutter: Secondary | ICD-10-CM

## 2024-02-07 DIAGNOSIS — D6869 Other thrombophilia: Secondary | ICD-10-CM

## 2024-02-07 DIAGNOSIS — R001 Bradycardia, unspecified: Secondary | ICD-10-CM

## 2024-02-07 DIAGNOSIS — I4891 Unspecified atrial fibrillation: Secondary | ICD-10-CM | POA: Diagnosis not present

## 2024-02-07 DIAGNOSIS — Z950 Presence of cardiac pacemaker: Secondary | ICD-10-CM

## 2024-02-07 MED ORDER — DRONEDARONE HCL 400 MG PO TABS: 400.0000 mg | ORAL_TABLET | Freq: Two times a day (BID) | ORAL | 3 refills | Status: DC

## 2024-02-07 NOTE — Patient Instructions (Addendum)
 Medication Instructions:   Your physician recommends the following medication changes.  START TAKING: Dronedarone (Multaq) 400 mg two times daily  Lab Work:  Your provider would like for you to have the following labs drawn at your next appointment at the [facility]: TSH, T4.   *If you need a refill on your cardiac medications before your next appointment, please call your pharmacy*  If you have labs (blood work) drawn today and your tests are completely normal, you will receive your results only by:  MyChart Message (if you have MyChart) OR  A paper copy in the mail If you have any lab test that is abnormal or we need to change your treatment, we will call you to review the results.  Testing/Procedures:  None ordered at this time   Referrals:  None ordered at this time   Follow-Up:  At [facility], you and your health needs are our priority.  As part of our continuing mission to provide you with exceptional heart care, our providers are all part of one team.  This team includes your primary Cardiologist (physician) and Advanced Practice Providers or APPs (Physician Assistants and Nurse Practitioners) who all work together to provide you with the care you need, when you need it.  Your next appointment:   2 - 3 month(s)  Provider:    Cuauhtemoc Huegel, NP    We recommend signing up for the patient portal called MyChart.  Sign up information is provided on this After Visit Summary.  MyChart is used to connect with patients for Virtual Visits (Telemedicine).  Patients are able to view lab/test results, encounter notes, upcoming appointments, etc.  Non-urgent messages can be sent to your provider as well.   To learn more about what you can do with MyChart, go to [email or web address].

## 2024-02-11 ENCOUNTER — Other Ambulatory Visit: Payer: Self-pay | Admitting: Emergency Medicine

## 2024-02-11 ENCOUNTER — Inpatient Hospital Stay: Attending: Internal Medicine

## 2024-02-11 DIAGNOSIS — I5032 Chronic diastolic (congestive) heart failure: Secondary | ICD-10-CM | POA: Diagnosis not present

## 2024-02-11 DIAGNOSIS — D509 Iron deficiency anemia, unspecified: Secondary | ICD-10-CM | POA: Diagnosis present

## 2024-02-11 DIAGNOSIS — N183 Chronic kidney disease, stage 3 unspecified: Secondary | ICD-10-CM | POA: Insufficient documentation

## 2024-02-11 DIAGNOSIS — I130 Hypertensive heart and chronic kidney disease with heart failure and stage 1 through stage 4 chronic kidney disease, or unspecified chronic kidney disease: Secondary | ICD-10-CM | POA: Diagnosis not present

## 2024-02-11 DIAGNOSIS — Z79899 Other long term (current) drug therapy: Secondary | ICD-10-CM

## 2024-02-11 DIAGNOSIS — C61 Malignant neoplasm of prostate: Secondary | ICD-10-CM | POA: Diagnosis not present

## 2024-02-11 DIAGNOSIS — C7951 Secondary malignant neoplasm of bone: Secondary | ICD-10-CM | POA: Insufficient documentation

## 2024-02-11 LAB — CMP (CANCER CENTER ONLY)
ALT: 16 U/L (ref 0–44)
AST: 21 U/L (ref 15–41)
Albumin: 3.9 g/dL (ref 3.5–5.0)
Alkaline Phosphatase: 46 U/L (ref 38–126)
Anion gap: 10 (ref 5–15)
BUN: 23 mg/dL (ref 8–23)
CO2: 25 mmol/L (ref 22–32)
Calcium: 9.7 mg/dL (ref 8.9–10.3)
Chloride: 105 mmol/L (ref 98–111)
Creatinine: 1.35 mg/dL — ABNORMAL HIGH (ref 0.61–1.24)
GFR, Estimated: 51 mL/min — ABNORMAL LOW (ref >60)
Glucose, Bld: 101 mg/dL — ABNORMAL HIGH (ref 70–99)
Potassium: 4.1 mmol/L (ref 3.5–5.1)
Sodium: 140 mmol/L (ref 135–145)
Total Bilirubin: 0.3 mg/dL (ref 0.0–1.2)
Total Protein: 6 g/dL — ABNORMAL LOW (ref 6.5–8.1)

## 2024-02-11 LAB — CBC WITH DIFFERENTIAL (CANCER CENTER ONLY)
Abs Immature Granulocytes: 0.02 K/uL (ref 0.00–0.07)
Basophils Absolute: 0 K/uL (ref 0.0–0.1)
Basophils Relative: 0 %
Eosinophils Absolute: 0.1 K/uL (ref 0.0–0.5)
Eosinophils Relative: 2 %
HCT: 36.3 % — ABNORMAL LOW (ref 39.0–52.0)
Hemoglobin: 12.2 g/dL — ABNORMAL LOW (ref 13.0–17.0)
Immature Granulocytes: 0 %
Lymphocytes Relative: 25 %
Lymphs Abs: 1.4 K/uL (ref 0.7–4.0)
MCH: 30.7 pg (ref 26.0–34.0)
MCHC: 33.6 g/dL (ref 30.0–36.0)
MCV: 91.2 fL (ref 80.0–100.0)
Monocytes Absolute: 0.6 K/uL (ref 0.1–1.0)
Monocytes Relative: 10 %
Neutro Abs: 3.6 K/uL (ref 1.7–7.7)
Neutrophils Relative %: 63 %
Platelet Count: 183 K/uL (ref 150–400)
RBC: 3.98 MIL/uL — ABNORMAL LOW (ref 4.22–5.81)
RDW: 13.5 % (ref 11.5–15.5)
WBC Count: 5.8 K/uL (ref 4.0–10.5)
nRBC: 0 % (ref 0.0–0.2)

## 2024-02-11 LAB — IRON AND TIBC
Iron: 65 ug/dL (ref 45–182)
Saturation Ratios: 19 % (ref 17.9–39.5)
TIBC: 344 ug/dL (ref 250–450)
UIBC: 279 ug/dL

## 2024-02-11 LAB — FERRITIN: Ferritin: 56 ng/mL (ref 24–336)

## 2024-02-11 LAB — PSA: Prostatic Specific Antigen: <0.02 ng/mL (ref 0.00–4.00)

## 2024-02-11 NOTE — Progress Notes (Signed)
 Lab order TSH changed from [facility] to [facility]

## 2024-02-12 ENCOUNTER — Ambulatory Visit: Payer: Self-pay | Admitting: Cardiology

## 2024-02-12 LAB — TSH: TSH: 1.26 u[IU]/mL (ref 0.450–4.500)

## 2024-02-14 ENCOUNTER — Ambulatory Visit: Admitting: Internal Medicine

## 2024-02-14 ENCOUNTER — Inpatient Hospital Stay

## 2024-02-14 ENCOUNTER — Encounter: Payer: Self-pay | Admitting: Internal Medicine

## 2024-02-14 VITALS — BP 141/82 | HR 79 | Temp 97.7°F | Resp 18 | Ht 70.0 in | Wt 216.0 lb

## 2024-02-14 VITALS — BP 142/82 | HR 78 | Resp 18

## 2024-02-14 DIAGNOSIS — C61 Malignant neoplasm of prostate: Secondary | ICD-10-CM

## 2024-02-14 DIAGNOSIS — D509 Iron deficiency anemia, unspecified: Secondary | ICD-10-CM | POA: Diagnosis not present

## 2024-02-14 MED ORDER — IRON SUCROSE 20 MG/ML IV SOLN
200.0000 mg | Freq: Once | INTRAVENOUS | Status: AC
  Administered 2024-02-14: 200 mg via INTRAVENOUS
  Filled 2024-02-14: qty 10

## 2024-02-14 NOTE — Patient Instructions (Signed)
°  VISIT SUMMARY: During today's visit, we discussed your new left testicular pain and swelling, your ongoing iron  deficiency anemia, and your prostate cancer remission status. We reviewed your recent symptoms and lab results, and I provided recommendations for treatment and follow-up care.  YOUR PLAN: -TINEA CRURIS: Tinea cruris, also known as jock itch, is a fungal infection that causes itching and soreness in the groin area. To treat this, I recommend using an over-the-counter topical antifungal cream, such as miconazole, applied to the groin once daily for two weeks. Additionally, make sure to dry the area thoroughly after showering. If you need help selecting a product, please consult your pharmacist.  -IRON  DEFICIENCY ANEMIA: Iron  deficiency anemia is a condition where your body lacks enough iron  to produce healthy red blood cells, leading to fatigue and low energy. Despite taking oral iron  supplements, your iron  levels remain low. To improve your energy levels, I recommend a single iron  infusion. Please continue taking your oral gentle iron  supplements as well.  -PROSTATE CANCER IN REMISSION: Your prostate cancer remains in remission, as indicated by your low PSA levels. We will continue to monitor your PSA and overall health, with a follow-up appointment scheduled in three months.  INSTRUCTIONS: Please follow up in three months to monitor your PSA levels and overall health. If you experience any new or worsening symptoms, contact our office immediately.                      Contains text generated by Abridge.                                 Contains text generated by Abridge.

## 2024-02-14 NOTE — Assessment & Plan Note (Addendum)
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  Currently getting intermittent Eligard . MAY 2025-- PSA <0.01.  Hold Eligard  today.  If significantly elevated would recommend Eligard . If PSA rising would consider bone scan.  Stable.   # Iron  deficient anemia/CKD- III-[s/p EGD March 2021]--status post IV iron  infusion x1 [FEB 2022]- Continue gentle iron   1 pill a day.  Venofer  today-  # Nov 26th, 2023-acute PE -lobar and segmental PE [[facility]]/ A.fib - on Eliquis  [; Dr.Aridano asprin/plavix ]-provoked left foot surgery. However, pt on indefinite Eliquis  5 mg twice daily. Stable.   # Bil inguinal fold fungal dermatitis- recommend anti-fungal OTC  miconazole.   # TIA-Dec 2024/ [Hx of stroke]- MRI- [facility] [Dr.Hamlin]- negative for any new stroke; old strokes- stable; On Vimpat [neurology-[facility]] - stable.   #CKD stage III-GFR 40-50s. Clinically Stable.   Eligard  22.5 q4 m- ? nov 2023.  # DISPOSITION:  # HOLD eligard ; #  Venofer  today # Follow up in 3 months-  MD; 2-3 days prior- labs- cbc.cmp/iron  studies; ferritin; PSA eligard ; possible venofer - Dr.B  [facility]; Dr.Scott/

## 2024-02-14 NOTE — Progress Notes (Signed)
 [facility] OFFICE PROGRESS NOTE  Patient Care Team: Nathaniel Shad, MD as PCP - General (Internal Medicine) Nathaniel Deatrice LABOR, MD as PCP - Cardiology (Cardiology) Nathaniel Chew, MD as PCP - Electrophysiology (Clinical Cardiac Electrophysiology) Nathaniel Deatrice LABOR, MD as Consulting Physician (Cardiology) Nathaniel Cindy SAUNDERS, MD as Consulting Physician (Hematology and Oncology) Jama, Nathaniel MATSU, MD (Vascular Surgery) Nathaniel Delmar Pike, NP as Nurse Practitioner (Gastroenterology) Nathaniel Dedra CROME, MD as Consulting Physician (Pulmonary Disease) Thompson, Suzann, NP as Nurse Practitioner (Clinical Cardiac Electrophysiology)   Cancer Staging  No matching staging information was found for the patient.    Oncology History Overview Note  Nov 2017- completed IM RT radiation therapy to his prostate and pelvic nodes for Gleason 7 (4+3) adenocarcinoma the prostate presenting the PSA of 7.8.  # JAN 2019- METASTATIC PROSTATE CA to Bone [low volume met on bone scan; CT- NED]; PSA ~50; March 25th 2019- Lupron  45 mg IM [Urology q 29M]; June 26, 2018-Eligard  every 3 months[intolerance to Lupron /question A. Fib/injection site pain  # May 23rd Zytiga  250mg /day; no prednisone ; STOPPED MARCH 17th 2021-repeated severe hypokalemia; Eligard   # Barretts- EGD/ colo [March 2021; Dr.Byrnett].   # [place] [[facility] [place]]; Oct 2019-paroxysmal A.fib/not on eliquis ; [Dr.Khan] -Dr.Arida ; Dizzy spells [Dr.Shah] s/p PPM [2023]  ------------------------------------------------------------------  DIAGNOSIS: [ JAN 2019]- Met- PROSTATE CANCER  STAGE:  IV       ;GOALS: PALLIATIVE     Prostate cancer (HCC)    INTERVAL HISTORY: Patient is with his wife.  He is walking with a cane.   Nathaniel Thompson 88 y.o.  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  Discussed the use of AI scribe software for clinical note transcription with the patient, who gave verbal consent to proceed.  History of Present Illness   Nathaniel Thompson is an 88 year old male with prostate cancer in remission and iron  deficiency anemia who presents for evaluation of new left testicular pain and swelling.  He is under surveillance for prostate cancer, with recent PSA levels remaining low. He has not experienced urinary symptoms.  Over the past three to four weeks, he has developed left testicular pain and swelling, describing the area as soft and the pain as severe with palpation. The right testicle is unaffected. He has not attempted any treatments for this issue prior to the visit.  He continues to have mild iron  deficiency anemia, with recent hemoglobin values of 12.7 and 12.2 and persistently low iron  levels. He is taking oral iron  supplements and reports low energy and fatigue, which may be related to anemia.  He has cardiac arrhythmia and has undergone two electrical cardioversions for rhythm control. He is currently on antiarrhythmic medication, with heart rate in the nineties over the past two days and measured at 79 on the day of the visit. Blood pressure and heart rate have been stable, and he denies chest pain, shortness of breath, or palpitations.  He denies recent COVID-19 infection and reports overall well-being aside from the aforementioned symptoms.      Review of Systems  Constitutional:  Negative for chills, diaphoresis, fever, malaise/fatigue and weight loss.  HENT:  Negative for nosebleeds and sore throat.   Eyes:  Negative for double vision.  Respiratory:  Negative for cough, hemoptysis, sputum production, shortness of breath and wheezing.   Cardiovascular:  Negative for chest  pain, palpitations, orthopnea and leg swelling.  Gastrointestinal:  Negative for abdominal pain, blood in stool, constipation, diarrhea, heartburn, melena, nausea  and vomiting.  Genitourinary:  Negative for dysuria, frequency and urgency.  Musculoskeletal:  Positive for myalgias. Negative for back pain and joint pain.  Skin: Negative.  Negative for itching and rash.  Neurological:  Negative for tingling, focal weakness, weakness and headaches.  Endo/Heme/Allergies:  Does not bruise/bleed easily.  Psychiatric/Behavioral:  Negative for depression. The patient is not nervous/anxious and does not have insomnia.     PAST MEDICAL HISTORY :  Past Medical History:  Diagnosis Date   (HFpEF) heart failure with preserved ejection fraction (HCC)    a. 05/2018 Echo: Nl EF, Gr1 DD, sev dil LA, mild MR/TR, mild PAH; b. 05/2019 Echo: EF 55-60%, no rwma, mild LVH. Nl PASP. Mildly dil LA. Triv MR. Mild AoV sclerosis w/o stenosis. Mildly dil Asc AO (39mm); d. 02/2023 Echo [facility]): EF >55%, nl RV fxn, mild PR.   AAA (abdominal aortic aneurysm)    Allergic rhinitis    Barrett's esophagus 10/21/2013   Bone cancer (HCC)    Cardiac pacemaker in situ    CKD (chronic kidney disease), stage III (HCC)    Colon polyp, hyperplastic    COPD (chronic obstructive pulmonary disease) (HCC)    mild COPD. former smoker   Coronary artery disease    a. 2003 s/p PCI ([facility]); b. 2004 s/p PCI x 2 ([facility]); c. 11/2017 Cath: LM nl, LAD mild dzs, LCX patent stent w/ 30% distal edge restenosis, RCA patent distal stent w/ 30% prox edge stenosis. Nl EF->Med Rx; d. 02/2021 Cath: LM nl, LAD min irregs, D1/2 min irregs, LCX 10p ISR, 26m, RCA 56m/d ISR, EF 65%; e. 07/2023 PET Stress: antlat and inflat ischemia.   Diverticulosis    Fundic gland polyps of stomach, benign    Gastritis    GERD (gastroesophageal reflux disease)    Hypercholesteremia    Hypertension    PAF (paroxysmal atrial fibrillation) (HCC)    a. CHA2DS2VASc = 7-->eliquis .   Prostate cancer (HCC)    Prostatism    Pulmonary embolism (HCC)    a. 2023 - lobar and segmental PE in setting of RSV.   Reflux esophagitis    Renal stones     Skin cancer    left cheek/lesion excised   Sleep apnea    Symptomatic bradycardia    Tachy-brady syndrome (HCC)    a. 03/2021 s/p MDT MRI compatible DC PPM (ser # [id number]).   Tachycardia    a. 11/2017 admit w/ tachycardia/? afib-->no afib noted upon review (amio/OAC d/c'd).   TIA (transient ischemic attack)    a. 02/2023 admit [facility] - no stroke on MRI; b. 06/2022 EEG: nl for age.    PAST SURGICAL HISTORY :   Past Surgical History:  Procedure Laterality Date   CARDIAC CATHETERIZATION     COLONOSCOPY WITH PROPOFOL  N/A 09/13/2015   Procedure: COLONOSCOPY WITH PROPOFOL ;  Surgeon: Gladis RAYMOND Mariner, MD;  Location: [facility] ENDOSCOPY;  Service: Endoscopy;  Laterality: N/A;   COLONOSCOPY WITH PROPOFOL  N/A 05/07/2019   Procedure: COLONOSCOPY WITH PROPOFOL ;  Surgeon: Dessa Reyes ORN, MD;  Location: [facility] ENDOSCOPY;  Service: Endoscopy;  Laterality: N/A;   CORONARY ANGIOPLASTY     ESOPHAGOGASTRODUODENOSCOPY (EGD) WITH PROPOFOL  N/A 09/13/2015   Procedure: ESOPHAGOGASTRODUODENOSCOPY (EGD) WITH PROPOFOL ;  Surgeon: Gladis RAYMOND Mariner, MD;  Location: [facility] ENDOSCOPY;  Service: Endoscopy;  Laterality: N/A;   ESOPHAGOGASTRODUODENOSCOPY (EGD) WITH PROPOFOL  N/A 12/28/2015   Procedure:  ESOPHAGOGASTRODUODENOSCOPY (EGD) WITH PROPOFOL ;  Surgeon: Gladis RAYMOND Mariner, MD;  Location: [facility] ENDOSCOPY;  Service: Endoscopy;  Laterality: N/A;   ESOPHAGOGASTRODUODENOSCOPY (EGD) WITH PROPOFOL  N/A 06/16/2016   Procedure: ESOPHAGOGASTRODUODENOSCOPY (EGD) WITH PROPOFOL ;  Surgeon: Gladis RAYMOND Mariner, MD;  Location: [facility] ENDOSCOPY;  Service: Endoscopy;  Laterality: N/A;   ESOPHAGOGASTRODUODENOSCOPY (EGD) WITH PROPOFOL  N/A 05/07/2019   Procedure: ESOPHAGOGASTRODUODENOSCOPY (EGD) WITH PROPOFOL ;  Surgeon: Dessa Reyes ORN, MD;  Location: [facility] ENDOSCOPY;  Service: Endoscopy;  Laterality: N/A;   EYE SURGERY  2013   CATARACT EXTRACTION   GANGLION CYST EXCISION Right 05/18/2017   Procedure: REMOVAL GANGLION CYST ANKLE;  Surgeon: Ashley Soulier, DPM;  Location: [facility] ORS;  Service: Podiatry;  Laterality: Right;   heart cath stent     LEFT HEART CATH AND CORONARY ANGIOGRAPHY Right 12/03/2017   Procedure: Left Heart Cath and Coronary Angiography with possible coronary intervention;  Surgeon: Fernand Denyse LABOR, MD;  Location: [facility] INVASIVE CV LAB;  Service: Cardiovascular;  Laterality: Right;   LEFT HEART CATH AND CORONARY ANGIOGRAPHY Left 07/27/2023   Procedure: LEFT HEART CATH AND CORONARY ANGIOGRAPHY;  Surgeon: Nathaniel Deatrice LABOR, MD;  Location: [facility] INVASIVE CV LAB;  Service: Cardiovascular;  Laterality: Left;   PACEMAKER IMPLANT N/A 04/04/2021   Procedure: PACEMAKER IMPLANT;  Surgeon: Fernande Elspeth BROCKS, MD;  Location: [facility] INVASIVE CV LAB;  Service: Cardiovascular;  Laterality: N/A;   PROSTATE BIOPSY     RIGHT/LEFT HEART CATH AND CORONARY ANGIOGRAPHY N/A 02/07/2021   Procedure: RIGHT/LEFT HEART CATH AND CORONARY ANGIOGRAPHY;  Surgeon: Nathaniel Deatrice LABOR, MD;  Location: [facility] INVASIVE CV LAB;  Service: Cardiovascular;  Laterality: N/A;   stents     multiple    FAMILY HISTORY :   Family History  Problem Relation Age of Onset   Cancer Mother        gastric and lung   Cancer Father        multiple myeloma   Stroke Father    Cancer Sister        leukemia   Cancer Brother        leukemia   Cancer Brother        kidney   Cancer Daughter        Uterine   Cancer Other        [place] Clinical Cytogeneticist Son): Prostate    SOCIAL HISTORY:   Social History   Tobacco Use   Smoking status: Former    Current packs/day: 0.00    Average packs/day: 1 pack/day for 35.0 years (35.0 ttl pk-yrs)    Types: Cigarettes    Start date: 03/07/1951    Quit date: 03/06/1986    Years since quitting: 37.9   Smokeless tobacco: Former    Types: Thompson    Quit date: 1985   Tobacco comments:    Started smoking around 88 yrs old.    Smoked 1 PPD at his heaviest.  Vaping Use   Vaping status: Never Used  Substance Use Topics   Alcohol use: No   Drug use: No     ALLERGIES:  is allergic to atorvastatin.  MEDICATIONS:  Current Outpatient Medications  Medication Sig Dispense Refill   albuterol  (VENTOLIN  HFA) 108 (90 Base) MCG/ACT inhaler Inhale into the lungs every 6 (six) hours as needed for wheezing or shortness of breath.     apixaban  (ELIQUIS ) 5 MG TABS tablet TAKE 1 TABLET BY MOUTH TWICE DAILY 180 tablet 3   budesonide -formoterol  (SYMBICORT ) 160-4.5 MCG/ACT inhaler Inhale 2 puffs into the lungs  2 (two) times daily. 10.2 g 11   Calcium  Carb-Cholecalciferol  (CALCIUM  + D3 PO) Take 1 tablet by mouth daily.     cholecalciferol  (VITAMIN D3) 25 MCG (1000 UNIT) tablet Take 1,000 Units by mouth daily.     cyanocobalamin  (VITAMIN B12) 1000 MCG tablet Take 1,000 mcg by mouth daily.     dronedarone  (MULTAQ ) 400 MG tablet Take 1 tablet (400 mg total) by mouth 2 (two) times daily with a meal. 180 tablet 3   Fe Bisgly-Vit C-Vit B12-FA (GENTLE IRON  PO) Take 28 mg by mouth daily.     furosemide  (LASIX ) 40 MG tablet Take 1 tablet (40 mg total) by mouth every Monday, Wednesday, and Friday. 45 tablet 3   isosorbide  mononitrate (IMDUR ) 30 MG 24 hr tablet Take 1 tablet (30 mg total) by mouth daily. 90 tablet 1   Lacosamide (VIMPAT) 100 MG TABS Take 100 mg by mouth in the morning and at bedtime.     losartan  (COZAAR ) 25 MG tablet Take 0.5 tablets (12.5 mg total) by mouth daily. 45 tablet 3   lovastatin  (MEVACOR ) 40 MG tablet Take 2 tablets (80 mg total) by mouth at bedtime. 180 tablet 1   metoprolol  succinate (TOPROL -XL) 25 MG 24 hr tablet Take 1 tablet (25 mg total) by mouth daily. 90 tablet 3   Multiple Vitamin (MULTI-VITAMINS) TABS Take 1 tablet by mouth daily.      pantoprazole  (PROTONIX ) 40 MG tablet Take 40 mg by mouth 2 (two) times daily.      No current facility-administered medications for this visit.    PHYSICAL EXAMINATION: ECOG PERFORMANCE STATUS: 0 - Asymptomatic  BP (!) 141/82 (BP Location: Left Arm, Patient Position: Sitting, Cuff Size: Large)  Comment: pt advised bp elevated, keep check at home, contact pcp if con't to stay elevated  Pulse 79   Temp 97.7 F (36.5 C) (Oral)   Resp 18   Ht 5' 10 (1.778 m)   Wt 216 lb (98 kg)   SpO2 100%   BMI 30.99 kg/m   Filed Weights   02/14/24 1005  Weight: 216 lb (98 kg)    Bilateral inguinal folds-fungal dermatitis/skin macerated no masses noted in the testicles.   Physical Exam HENT:     Head: Normocephalic and atraumatic.     Mouth/Throat:     Pharynx: No oropharyngeal exudate.  Eyes:     Pupils: Pupils are equal, round, and reactive to light.  Cardiovascular:     Rate and Rhythm: Normal rate and regular rhythm.  Pulmonary:     Effort: No respiratory distress.     Breath sounds: No wheezing.  Abdominal:     General: Bowel sounds are normal. There is no distension.     Palpations: Abdomen is soft. There is no mass.     Tenderness: There is no abdominal tenderness. There is no guarding or rebound.  Musculoskeletal:        General: No tenderness. Normal range of motion.     Cervical back: Normal range of motion and neck supple.  Skin:    General: Skin is warm.  Neurological:     Mental Status: He is alert and oriented to person, place, and time.  Psychiatric:        Mood and Affect: Affect normal.     LABORATORY DATA:  I have reviewed the data as listed    Component Value Date/Time   NA 140 02/11/2024 0931   NA 145 (H) 01/23/2024 1410   NA 140 09/16/2013  0438   K 4.1 02/11/2024 0931   K 4.2 09/16/2013 0438   CL 105 02/11/2024 0931   CL 109 (H) 09/16/2013 0438   CO2 25 02/11/2024 0931   CO2 24 09/16/2013 0438   GLUCOSE 101 (H) 02/11/2024 0931   GLUCOSE 101 (H) 09/16/2013 0438   BUN 23 02/11/2024 0931   BUN 17 01/23/2024 1410   BUN 20 (H) 09/16/2013 0438   CREATININE 1.35 (H) 02/11/2024 0931   CREATININE 1.28 09/16/2013 0438   CALCIUM  9.7 02/11/2024 0931   CALCIUM  8.3 (L) 09/16/2013 0438   PROT 6.0 (L) 02/11/2024 0931   PROT 6.7 09/15/2013 0755    ALBUMIN 3.9 02/11/2024 0931   ALBUMIN 3.5 09/15/2013 0755   AST 21 02/11/2024 0931   ALT 16 02/11/2024 0931   ALT 28 09/15/2013 0755   ALKPHOS 46 02/11/2024 0931   ALKPHOS 44 (L) 09/15/2013 0755   BILITOT 0.3 02/11/2024 0931   GFRNONAA 51 (L) 02/11/2024 0931   GFRNONAA 54 (L) 09/16/2013 0438   GFRAA 59 (L) 10/07/2019 1305   GFRAA >60 09/16/2013 0438    No results found for: SPEP, UPEP  Lab Results  Component Value Date   WBC 5.8 02/11/2024   NEUTROABS 3.6 02/11/2024   HGB 12.2 (L) 02/11/2024   HCT 36.3 (L) 02/11/2024   MCV 91.2 02/11/2024   PLT 183 02/11/2024      Chemistry      Component Value Date/Time   NA 140 02/11/2024 0931   NA 145 (H) 01/23/2024 1410   NA 140 09/16/2013 0438   K 4.1 02/11/2024 0931   K 4.2 09/16/2013 0438   CL 105 02/11/2024 0931   CL 109 (H) 09/16/2013 0438   CO2 25 02/11/2024 0931   CO2 24 09/16/2013 0438   BUN 23 02/11/2024 0931   BUN 17 01/23/2024 1410   BUN 20 (H) 09/16/2013 0438   CREATININE 1.35 (H) 02/11/2024 0931   CREATININE 1.28 09/16/2013 0438      Component Value Date/Time   CALCIUM  9.7 02/11/2024 0931   CALCIUM  8.3 (L) 09/16/2013 0438   ALKPHOS 46 02/11/2024 0931   ALKPHOS 44 (L) 09/15/2013 0755   AST 21 02/11/2024 0931   ALT 16 02/11/2024 0931   ALT 28 09/15/2013 0755   BILITOT 0.3 02/11/2024 0931       RADIOGRAPHIC STUDIES: I have personally reviewed the radiological images as listed and agreed with the findings in the report. No results found.   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  Currently getting intermittent Eligard . MAY 2025-- PSA <0.01.  Hold Eligard  today.  If significantly elevated would recommend Eligard . If PSA rising would consider bone scan.  Stable.   # Iron  deficient anemia/CKD- III-[s/p EGD March 2021]--status post IV iron  infusion x1 [FEB 2022]-  Continue gentle iron   1 pill a day.  Venofer  today-  # Nov 26th, 2023-acute PE -lobar and segmental PE [[facility]]/ A.fib - on Eliquis  [; Dr.Aridano asprin/plavix ]-provoked left foot surgery. However, pt on indefinite Eliquis  5 mg twice daily. Stable.   # Bil inguinal fold fungal dermatitis- recommend anti-fungal OTC  miconazole.   # TIA-Dec 2024/ [Hx of stroke]- MRI- [facility] [Dr.Hamlin]- negative for any new stroke; old strokes- stable; On Vimpat [neurology-[facility]] - stable.   #CKD stage III-GFR 40-50s. Clinically Stable.   Eligard  22.5 q4 m- ? nov 2023.  # DISPOSITION:  #  HOLD eligard ; #  Venofer  today # Follow up in 3 months-  MD; 2-3 days prior- labs- cbc.cmp/iron  studies; ferritin; PSA eligard ; possible venofer - Dr.B  Cc; Dr.Scott/    Orders Placed This Encounter  Procedures   CBC with Differential (Cancer Center Only)    Standing Status:   Future    Expected Date:   05/14/2024    Expiration Date:   08/12/2024   Iron  and TIBC    Standing Status:   Future    Expected Date:   05/14/2024    Expiration Date:   08/12/2024   Ferritin    Standing Status:   Future    Expected Date:   05/14/2024    Expiration Date:   08/12/2024   CMP (Cancer Center only)    Standing Status:   Future    Expected Date:   05/14/2024    Expiration Date:   08/12/2024   PSA    Standing Status:   Future    Expected Date:   05/14/2024    Expiration Date:   08/12/2024   All questions were answered. The patient knows to call the clinic with any problems, questions or concerns.      Cindy JONELLE Joe, MD 02/14/2024 11:16 AM

## 2024-02-14 NOTE — Progress Notes (Signed)
 01/02/24 cardioversion done by Dr. Darron.  Pt has questions about his privates.

## 2024-02-22 ENCOUNTER — Telehealth: Payer: Self-pay

## 2024-02-22 ENCOUNTER — Telehealth: Payer: Self-pay | Admitting: Internal Medicine

## 2024-02-22 MED ORDER — OSELTAMIVIR PHOSPHATE 75 MG PO CAPS: 75.0000 mg | ORAL_CAPSULE | Freq: Every day | ORAL | 0 refills | Status: DC

## 2024-02-22 NOTE — Telephone Encounter (Signed)
 Copied from CRM #[id number]. Topic: Clinical - Medical Advice >> Feb 22, 2024  9:40 AM Charolett L wrote: Reason for CRM: Patients wife called in and stated that she's sick and was adv by another MD told her to have her husband take Tamaflu because she's sick and she wants to prevent him from getting sick. Patients wife is requesting a call back to discuss him getting the medication [phone]

## 2024-02-22 NOTE — Telephone Encounter (Signed)
 Message already has been handled

## 2024-02-22 NOTE — Telephone Encounter (Signed)
 Called and spoke to Ms Peregoy. She was diagnosed with influenza A. Husband has no symptoms. Discussed prophylactic treatment for her husband. Confirm only allergy is atorvastatin. Will start tamiflu 75mg  q day. If symptoms, will change to 75mg  bid. Call with update. Rx sent in for tamiflu.

## 2024-03-18 ENCOUNTER — Ambulatory Visit: Admitting: Pulmonary Disease

## 2024-03-18 ENCOUNTER — Ambulatory Visit

## 2024-03-18 ENCOUNTER — Encounter: Payer: Self-pay | Admitting: Pulmonary Disease

## 2024-03-18 VITALS — BP 118/68 | HR 79 | Temp 98.8°F | Ht 70.0 in | Wt 223.4 lb

## 2024-03-18 DIAGNOSIS — J4520 Mild intermittent asthma, uncomplicated: Secondary | ICD-10-CM | POA: Diagnosis not present

## 2024-03-18 DIAGNOSIS — G4733 Obstructive sleep apnea (adult) (pediatric): Secondary | ICD-10-CM

## 2024-03-18 DIAGNOSIS — Z87891 Personal history of nicotine dependence: Secondary | ICD-10-CM

## 2024-03-18 DIAGNOSIS — R0602 Shortness of breath: Secondary | ICD-10-CM

## 2024-03-18 DIAGNOSIS — J4530 Mild persistent asthma, uncomplicated: Secondary | ICD-10-CM

## 2024-03-18 LAB — PULMONARY FUNCTION TEST
DL/VA % pred: 73 %
DL/VA: 2.75 ml/min/mmHg/L
DLCO cor % pred: 68 %
DLCO cor: 15.98 ml/min/mmHg
DLCO unc % pred: 63 %
DLCO unc: 14.78 ml/min/mmHg
FEF 25-75 Post: 2.5 L/s
FEF 25-75 Pre: 1.54 L/s
FEF2575-%Change-Post: 61 %
FEF2575-%Pred-Post: 149 %
FEF2575-%Pred-Pre: 92 %
FEV1-%Change-Post: 10 %
FEV1-%Pred-Post: 93 %
FEV1-%Pred-Pre: 84 %
FEV1-Post: 2.45 L
FEV1-Pre: 2.21 L
FEV1FVC-%Change-Post: 0 %
FEV1FVC-%Pred-Pre: 104 %
FEV6-%Change-Post: 10 %
FEV6-%Pred-Post: 94 %
FEV6-%Pred-Pre: 85 %
FEV6-Post: 3.3 L
FEV6-Pre: 2.98 L
FEV6FVC-%Change-Post: 0 %
FEV6FVC-%Pred-Post: 106 %
FEV6FVC-%Pred-Pre: 106 %
FVC-%Change-Post: 10 %
FVC-%Pred-Post: 88 %
FVC-%Pred-Pre: 79 %
FVC-Post: 3.34 L
FVC-Pre: 3.01 L
Post FEV1/FVC ratio: 73 %
Post FEV6/FVC ratio: 99 %
Pre FEV1/FVC ratio: 74 %
Pre FEV6/FVC Ratio: 99 %
RV % pred: 126 %
RV: 3.51 L
TLC % pred: 110 %
TLC: 7.83 L

## 2024-03-18 MED ORDER — BUDESONIDE-FORMOTEROL FUMARATE 160-4.5 MCG/ACT IN AERO: 2.0000 | INHALATION_SPRAY | Freq: Two times a day (BID) | RESPIRATORY_TRACT | 11 refills | Status: AC

## 2024-03-18 NOTE — Patient Instructions (Signed)
 Full PFT completed today ? ?

## 2024-03-18 NOTE — Progress Notes (Unsigned)
 "  Subjective:    Patient ID: Nathaniel Thompson, male    DOB: Feb 21, 1936, 89 y.o.   MRN: [id number]  Patient Care Team: Glendia Shad, MD as PCP - General (Internal Medicine) Darron Deatrice LABOR, MD as PCP - Cardiology (Cardiology) Kennyth Chew, MD as PCP - Electrophysiology (Clinical Cardiac Electrophysiology) Darron Deatrice LABOR, MD as Consulting Physician (Cardiology) Rennie Cindy JONELLE, MD as Consulting Physician (Hematology and Oncology) Jama, Cordella MATSU, MD (Vascular Surgery) Jane Delmar Pike, NP as Nurse Practitioner (Gastroenterology) Tamea Dedra CROME, MD as Consulting Physician (Pulmonary Disease) Riddle, Suzann, NP as Nurse Practitioner (Clinical Cardiac Electrophysiology)  Chief Complaint  Patient presents with   Follow-up    SOB Asthma OSA    BACKGROUND/INTERVAL:Patient is an 89 year old with prior history of obstructive sleep apnea and chronic bronchitis previously evaluated here by Dr. Laurance Flor for dyspnea on exertion and sleep apnea, last seen 19 September 2018 by Dr. Flor.  First visit with me on 01 May 2023 for evaluation of a lung nodule which is a granuloma.  Was seen on 22 November 2023 for an ACUTE visit due to cough, shortness of breath and wheezing.  Last seen on 14 January 2024 after that.  He had PFTs today.  HPI Discussed the use of AI scribe software for clinical note transcription with the patient, who gave verbal consent to proceed.  History of Present Illness   Nathaniel Thompson is an 89 year old male with asthma and obstructive sleep apnea who presents with cough and shortness of breath.  He is accompanied by his wife Nathaniel Thompson.  He experiences occasional cough, which he manages with his inhaler. He has been prescribed Symbicort , a red and gray inhaler, to be used twice daily, two puffs in the morning and two in the evening. However, he does not use it regularly.  He experiences shortness of breath, particularly with  exertion, which limits his ability to engage in physical activities. This symptom has been persistent and significantly impacts his daily life, making it difficult for him to participate in activities.  He uses a CPAP machine for obstructive sleep apnea, which he feels he is managing well. His current machine is at least eight years old, and he receives parts from [employer].  We are unable to obtain download of compliance.  He feels that he needs a new machine.  He mentions a history of heart issues but has no recent chest pain.  He has regular cardiology follow-up.     Prior data documented on Dr. Jamel visits: **Desat walk 09/19/18>> at rest on RA, sat was 96% and HR 55, walked 360 feet at fast pace with sat 96% and HR 85. Moderate dyspnea.  **CPAP download 07/10/2018-08/08/2018>> uses greater than 4 hours is 28/30 days.  Average usage on days used is 5 hours 36 minutes.  Pressure ranges 10-20.  Median pressure 12, 95th percentile pressure 15, maximum pressure 17.  Leaks are within normal limits.  Residual AHI is 4.9.  Overall this shows very good compliance with with CPAP and excellent control obstructive sleep apnea. **PFT 09/16/2018>> FVC is 98% predicted, FEV1 is 104% predicted, ratio is 76%.  There is no significant improvement with bronchodilator.  TLC is 94% predicted.  Ratio is within normal limits.  Flow volume loop is unremarkable.  DLCO 62% predicted.  Overall this shows normal pulmonary function without evidence of significant COPD/emphysema. **Chest x-ray 04/30/2018>> imaging personally reviewed, mild changes of chronic bronchitis, lungs are otherwise unremarkable.  Nodule in the  right upper lung zone. **CPAP download 03/31/2018-04/29/2018>> Usage greater than 4 hours is 20/30 days.  Average usage on days used is 5 hours 11 minutes.  Pressure ranges 5-20.  Median pressure 13, 95th percentile pressure 16, max pressure 17.5.  Leaks are within normal limits.  Residual AHI is 4.  Overall this  shows inadequate compliance with CPAP with good control of obstructive sleep apnea when used. **Left heart cath 12/03/2017>> mild stable coronary artery disease,no significant CAD with normal LVEF. **CPAP titration 12/24/2009>>  **Review of old records, previous sleep study 12/02/2009.  AHI of 20, consistent with moderate obstructive sleep apnea.  CPAP was recommended.    DATA 02/12/2023 echocardiogram [facility]): LVEF over 55%, grade 1 DD.  RV appears normal, mild mitral regurgitation, mild pulmonic regurgitation 03/23/2023 CT chest without contrast: No CT evidence of acute intrathoracic abnormality, very mild subpleural reticulation consistent with fibrosis.  Calcified granuloma in the right upper lobe for which no additional imaging follow-up is recommended, cardiomegaly. 03/18/2024 PFTs: FEV1 2.21 L or 84% predicted, FVC 3.01 L or 79% of predicted, FEV1/FVC 74%, there is a mild response to bronchodilators.  Lung volumes show mild hyperinflation and air trapping.  Diffusion capacity mildly reduced when alveolar volume taken into account.  ERV severely reduced consistent with obesity.  Compared to prior there has been a modest decline in FEV1.  Review of Systems A 10 point review of systems was performed and it is as noted above otherwise negative.   Patient Active Problem List   Diagnosis Date Noted   PAD (peripheral artery disease) 01/23/2024   Abnormal chest x-ray 11/16/2023   Abnormal stress test 07/27/2023   Lung nodule 03/11/2023   Paresthesia of arm 02/17/2023   Healthcare maintenance 08/01/2022   Sinus bradycardia 05/16/2022   VT (ventricular tachycardia) (HCC) - non-sustained 05/16/2022   Pacemaker - MDT 05/16/2022   Persistent dry cough 01/07/2022   Primary osteoarthritis of left knee 11/11/2021   Shortness of breath    Unstable angina (HCC)    Chronic heart failure with preserved ejection fraction (HCC) 06/23/2020   Foot pain 07/16/2019   Nasal congestion 07/16/2019   B12  deficiency 04/06/2019   Iron  deficiency anemia due to chronic blood loss 03/31/2019   GERD without esophagitis 12/30/2018   History of coronary artery disease 12/30/2018   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/30/2018   Abdominal aortic aneurysm (AAA) 12/07/2018   Anemia 12/07/2018   Right hip pain 11/22/2018   Atrial fibrillation (HCC) 11/27/2017   Dizziness 08/12/2017   Nasal erythema 04/10/2017   Carotid artery disease 08/06/2016   History of TIA (transient ischemic attack) 07/24/2016   Sleep apnea 07/24/2016   COPD (chronic obstructive pulmonary disease) (HCC) 07/24/2016   CKD (chronic kidney disease) stage 3, GFR 30-59 ml/min (HCC) 07/24/2016   Barrett's esophagus 07/24/2016   Hyperglycemia 07/24/2016   Hypercholesterolemia 07/12/2016   Hypertension, essential 07/12/2016   Coronary artery disease involving native heart with angina pectoris 07/12/2016   Prostate cancer (HCC) 11/15/2015   Family history of cancer 11/15/2015   Goals of care, counseling/discussion 06/18/2014   Acute rhinitis 02/12/2014   S/P coronary artery stent placement 10/02/2013   History of skin cancer 09/02/2013    Social History   Tobacco Use   Smoking status: Former    Current packs/day: 0.00    Average packs/day: 1 pack/day for 35.0 years (35.0 ttl pk-yrs)    Types: Cigarettes    Start date: 03/07/1951    Quit date: 03/06/1986    Years  since quitting: 38.0   Smokeless tobacco: Former    Types: Chew    Quit date: 1985   Tobacco comments:    Started smoking around 89 yrs old.    Smoked 1 PPD at his heaviest.  Substance Use Topics   Alcohol use: No    Allergies[1]  Active Medications[2]  Immunization History  Administered Date(s) Administered   Fluad Quad(high Dose 65+) 11/05/2018, 11/06/2019, 11/17/2020, 11/24/2021   Fluad Trivalent(High Dose 65+) 11/14/2022   INFLUENZA, HIGH DOSE SEASONAL PF 12/26/2012, 12/17/2014, 12/17/2015, 11/28/2016, 12/18/2023   Influenza, Seasonal, Injecte, Preservative  Fre 12/22/2010   Influenza,inj,Quad PF,6+ Mos 12/28/2011, 12/18/2013   Influenza-Unspecified 12/05/2011, 12/05/2015, 11/04/2017   Moderna Covid-19 Vaccine Bivalent Booster 19yrs & up 12/06/2021   PFIZER Comirnaty(Gray Top)Covid-19 Tri-Sucrose Vaccine 04/28/2019, 05/26/2019   PFIZER(Purple Top)SARS-COV-2 Vaccination 03/25/2019, 04/19/2019, 12/11/2019   Pneumococcal Conjugate-13 07/12/2016   Pneumococcal Polysaccharide-23 03/06/2006   Td 09/03/1992   Zoster Recombinant(Shingrix) 01/07/2019, 03/10/2019      Objective:     Vitals:   03/18/24 1417  BP: 118/68  Pulse: 79  Temp: 98.8 F (37.1 C)  Height: 5' 10 (1.778 m)  Weight: 223 lb 6 oz (101.3 kg)  SpO2: 99%  TempSrc: Oral  BMI (Calculated): 32.05     SpO2: 99 % GENERAL: Well-developed, well-nourished elderly gentleman, no acute distress.  Fully ambulatory. HEAD: Normocephalic, atraumatic.  EYES: Pupils equal, round, reactive to light.  No scleral icterus.  MOUTH: Wears upper dentures, oral mucosa moist.  No thrush NECK: Supple. No thyromegaly. Trachea midline. No JVD.  No adenopathy. PULMONARY: Good air entry bilaterally.  Scattered wheezes throughout. CARDIOVASCULAR: S1 and S2. Regular rate and rhythm.  There is a grade 1/6 mitral regurgitation murmur noted. ABDOMEN: Protuberant, significant truncal obesity. MUSCULOSKELETAL: No joint deformity, no clubbing, no edema.  NEUROLOGIC: No overt focal deficit, no gait disturbance, speech is fluent. SKIN: Intact,warm,dry. PSYCH: Mood and behavior normal.   Recent Results (from the past 2160 hours)  Hepatic function panel     Status: Abnormal   Collection Time: 12/28/23  7:37 AM  Result Value Ref Range   Total Bilirubin 0.4 0.2 - 1.2 mg/dL   Bilirubin, Direct 0.1 0.0 - 0.3 mg/dL   Alkaline Phosphatase 42 39 - 117 U/L   AST 15 0 - 37 U/L   ALT 11 0 - 53 U/L   Total Protein 5.8 (L) 6.0 - 8.3 g/dL   Albumin 3.8 3.5 - 5.2 g/dL  Basic metabolic panel     Status: None    Collection Time: 12/28/23  7:37 AM  Result Value Ref Range   Sodium 142 135 - 145 mEq/L   Potassium 4.6 3.5 - 5.1 mEq/L   Chloride 107 96 - 112 mEq/L   CO2 27 19 - 32 mEq/L   Glucose, Bld 98 70 - 99 mg/dL   BUN 19 6 - 23 mg/dL   Creatinine, Ser 8.90 0.40 - 1.50 mg/dL   GFR 38.34 >39.99 mL/min    Comment: Calculated using the CKD-EPI Creatinine Equation (2021)   Calcium  9.3 8.4 - 10.5 mg/dL  Hemoglobin J8r     Status: None   Collection Time: 12/28/23  7:37 AM  Result Value Ref Range   Hgb A1c MFr Bld 5.4 4.6 - 6.5 %    Comment: Glycemic Control Guidelines for People with Diabetes:Non Diabetic:  <6%Goal of Therapy: <7%Additional Action Suggested:  >8%   Lipid panel     Status: Abnormal   Collection Time: 12/28/23  7:37 AM  Result Value Ref Range   Cholesterol 129 0 - 200 mg/dL    Comment: ATP III Classification       Desirable:  < 200 mg/dL               Borderline High:  200 - 239 mg/dL          High:  > = 759 mg/dL   Triglycerides 878.9 0.0 - 149.0 mg/dL    Comment: Normal:  <849 mg/dLBorderline High:  150 - 199 mg/dL   HDL 64.69 (L) >60.99 mg/dL   VLDL 75.7 0.0 - 59.9 mg/dL   LDL Cholesterol 70 0 - 99 mg/dL   Total CHOL/HDL Ratio 4     Comment:                Men          Women1/2 Average Risk     3.4          3.3Average Risk          5.0          4.42X Average Risk          9.6          7.13X Average Risk          15.0          11.0                       NonHDL 93.90     Comment: NOTE:  Non-HDL goal should be 30 mg/dL higher than patient's LDL goal (i.e. LDL goal of < 70 mg/dL, would have non-HDL goal of < 100 mg/dL)  Basic metabolic panel     Status: Abnormal   Collection Time: 01/02/24 11:51 AM  Result Value Ref Range   Sodium 139 135 - 145 mmol/L   Potassium 4.1 3.5 - 5.1 mmol/L   Chloride 107 98 - 111 mmol/L   CO2 22 22 - 32 mmol/L   Glucose, Bld 97 70 - 99 mg/dL    Comment: Glucose reference range applies only to samples taken after fasting for at least 8 hours.   BUN  21 8 - 23 mg/dL   Creatinine, Ser 8.67 (H) 0.61 - 1.24 mg/dL   Calcium  9.3 8.9 - 10.3 mg/dL   GFR, Estimated 53 (L) >60 mL/min    Comment: (NOTE) Calculated using the CKD-EPI Creatinine Equation (2021)    Anion gap 10 5 - 15    Comment: Performed at [facility], [address] [place], [place] [address]  CBC     Status: Abnormal   Collection Time: 01/02/24 11:51 AM  Result Value Ref Range   WBC 5.9 4.0 - 10.5 K/uL   RBC 4.13 (L) 4.22 - 5.81 MIL/uL   Hemoglobin 12.7 (L) 13.0 - 17.0 g/dL   HCT 61.2 (L) 60.9 - 47.9 %   MCV 93.7 80.0 - 100.0 fL   MCH 30.8 26.0 - 34.0 pg   MCHC 32.8 30.0 - 36.0 g/dL   RDW 85.4 88.4 - 84.4 %   Platelets 196 150 - 400 K/uL   nRBC 0.0 0.0 - 0.2 %    Comment: Performed at [facility], [address] [place], [place] [address]  Troponin I (High Sensitivity)     Status: Abnormal   Collection Time: 01/02/24 11:51 AM  Result Value Ref Range   Troponin I (High Sensitivity) 22 (H) <18 ng/L    Comment: (NOTE) Elevated  high sensitivity troponin I (hsTnI) values and significant  changes across serial measurements may suggest ACS but many other  chronic and acute conditions are known to elevate hsTnI results.  Refer to the Links section for chest pain algorithms and additional  guidance. Performed at [facility], [address] [place], [place] [address]   Magnesium      Status: None   Collection Time: 01/02/24 11:51 AM  Result Value Ref Range   Magnesium  2.0 1.7 - 2.4 mg/dL    Comment: Performed at [facility], [address] [place], [place] [address]  Phosphorus     Status: None   Collection Time: 01/02/24 11:51 AM  Result Value Ref Range   Phosphorus 3.4 2.5 - 4.6 mg/dL    Comment: Performed at [facility], [address] [place], [place] [address]  Urinalysis, w/ Reflex to Culture (Infection Suspected) -Urine, Clean Catch     Status: Abnormal   Collection Time: 01/02/24 12:13 PM  Result  Value Ref Range   Specimen Source URINE, CLEAN CATCH    Color, Urine COLORLESS (A) YELLOW   APPearance CLEAR (A) CLEAR   Specific Gravity, Urine 1.002 (L) 1.005 - 1.030   pH 5.0 5.0 - 8.0   Glucose, UA NEGATIVE NEGATIVE mg/dL   Hgb urine dipstick NEGATIVE NEGATIVE   Bilirubin Urine NEGATIVE NEGATIVE   Ketones, ur NEGATIVE NEGATIVE mg/dL   Protein, ur NEGATIVE NEGATIVE mg/dL   Nitrite NEGATIVE NEGATIVE   Leukocytes,Ua NEGATIVE NEGATIVE   RBC / HPF 0-5 0 - 5 RBC/hpf   WBC, UA 0-5 0 - 5 WBC/hpf    Comment:        Reflex urine culture not performed if WBC <=10, OR if Squamous epithelial cells >5. If Squamous epithelial cells >5 suggest recollection.    Bacteria, UA NONE SEEN NONE SEEN   Squamous Epithelial / HPF 0 0 - 5 /HPF    Comment: Performed at [facility], [address] [place], [place] [address]  Troponin I (High Sensitivity)     Status: Abnormal   Collection Time: 01/02/24  1:40 PM  Result Value Ref Range   Troponin I (High Sensitivity) 25 (H) <18 ng/L    Comment: (NOTE) Elevated high sensitivity troponin I (hsTnI) values and significant  changes across serial measurements may suggest ACS but many other  chronic and acute conditions are known to elevate hsTnI results.  Refer to the Links section for chest pain algorithms and additional  guidance. Performed at [facility], [address] [place], [place] [address]   CUP PACEART REMOTE DEVICE CHECK     Status: None   Collection Time: 01/08/24  1:39 AM  Result Value Ref Range   Date Time Interrogation Session [id number]    Pulse Generator Manufacturer MERM    Pulse Gen Model [id number] Azure XT DR MRI    Pulse Gen Serial Number [id number]    Clinic Name [facility]    Implantable Pulse Generator Type Implantable Pulse Generator    Implantable Pulse Generator Implant Date 79769869    Implantable Lead Manufacturer [facility]    Implantable Lead Model 3830 SelectSecure MRI SureScan    Implantable  Lead Serial Number [id number]    Implantable Lead Implant Date 79769869    Implantable Lead Location Detail 1 UNKNOWN    Implantable Lead Special Function LBB    Implantable Lead Location [id number]    Implantable Lead Connection Status [id number]    Implantable Lead Manufacturer MERM  Implantable Lead Model 5076 CapSureFix Novus MRI SureScan    Implantable Lead Serial Number [id number]    Implantable Lead Implant Date 79769869    Implantable Lead Location Detail 1 APPENDAGE    Implantable Lead Location [id number]    Implantable Lead Connection Status [id number]    Lead Channel Setting Sensing Sensitivity 0.9 mV   Lead Channel Setting Pacing Amplitude 1.75 V   Lead Channel Setting Pacing Pulse Width 0.4 ms   Lead Channel Setting Pacing Amplitude 2 V   Zone Setting Status [id number]    Zone Setting Status Active    Lead Channel Impedance Value 380 ohm   Lead Channel Impedance Value 304 ohm   Lead Channel Sensing Intrinsic Amplitude 1.625 mV   Lead Channel Sensing Intrinsic Amplitude 1.625 mV   Lead Channel Pacing Threshold Amplitude 0.75 V   Lead Channel Pacing Threshold Pulse Width 0.4 ms   Lead Channel Impedance Value 589 ohm   Lead Channel Impedance Value 437 ohm   Lead Channel Sensing Intrinsic Amplitude 3.125 mV   Lead Channel Sensing Intrinsic Amplitude 3.125 mV   Lead Channel Pacing Threshold Amplitude 0.875 V   Lead Channel Pacing Threshold Pulse Width 0.4 ms   Battery Status OK    Battery Remaining Longevity 125 mo   Battery Voltage 3.02 V   Brady Statistic RA Percent Paced 78.32 %   Brady Statistic RV Percent Paced 0.22 %   Brady Statistic AP VP Percent 0.11 %   Brady Statistic AS VP Percent 0.07 %   Brady Statistic AP VS Percent 82.54 %   Brady Statistic AS VS Percent 17.32 %  Nitric oxide      Status: None   Collection Time: 01/14/24  2:24 PM  Result Value Ref Range   Nitric Oxide  23   Basic metabolic panel with GFR     Status: Abnormal   Collection Time: 01/23/24  2:10 PM   Result Value Ref Range   Glucose 94 70 - 99 mg/dL   BUN 17 8 - 27 mg/dL   Creatinine, Ser 8.66 (H) 0.76 - 1.27 mg/dL   eGFR 52 (L) >40 fO/fpw/8.26   BUN/Creatinine Ratio 13 10 - 24   Sodium 145 (H) 134 - 144 mmol/L   Potassium 4.2 3.5 - 5.2 mmol/L   Chloride 104 96 - 106 mmol/L   CO2 23 20 - 29 mmol/L   Calcium  9.6 8.6 - 10.2 mg/dL  PSA     Status: None   Collection Time: 02/11/24  9:31 AM  Result Value Ref Range   Prostatic Specific Antigen <0.02 0.00 - 4.00 ng/mL    Comment: (NOTE) While PSA levels of <=4.00 ng/ml are reported as reference range, some men with levels below 4.00 ng/ml can have prostate cancer and many men with PSA above 4.00 ng/ml do not have prostate cancer.  Other tests such as free PSA, age specific reference ranges, PSA velocity and PSA doubling time may be helpful especially in men less than 29 years old. Performed at [facility], [address], [place], [place] [address]   Ferritin     Status: None   Collection Time: 02/11/24  9:31 AM  Result Value Ref Range   Ferritin 56 24 - 336 ng/mL    Comment: Performed at [facility], [address] [place], [place] [address]  Iron  and TIBC     Status: None   Collection Time: 02/11/24  9:31 AM  Result Value Ref Range   Iron  65  45 - 182 ug/dL   TIBC 655 749 - 549 ug/dL   Saturation Ratios 19 17.9 - 39.5 %   UIBC 279 ug/dL    Comment: Performed at [facility], [address] [place], [place] [address]  CMP (Cancer Center only)     Status: Abnormal   Collection Time: 02/11/24  9:31 AM  Result Value Ref Range   Sodium 140 135 - 145 mmol/L   Potassium 4.1 3.5 - 5.1 mmol/L   Chloride 105 98 - 111 mmol/L   CO2 25 22 - 32 mmol/L   Glucose, Bld 101 (H) 70 - 99 mg/dL    Comment: Glucose reference range applies only to samples taken after fasting for at least 8 hours.   BUN 23 8 - 23 mg/dL   Creatinine 8.64 (H) 9.38 - 1.24 mg/dL   Calcium  9.7 8.9 - 10.3 mg/dL   Total Protein 6.0  (L) 6.5 - 8.1 g/dL   Albumin 3.9 3.5 - 5.0 g/dL   AST 21 15 - 41 U/L   ALT 16 0 - 44 U/L   Alkaline Phosphatase 46 38 - 126 U/L   Total Bilirubin 0.3 0.0 - 1.2 mg/dL   GFR, Estimated 51 (L) >60 mL/min    Comment: (NOTE) Calculated using the CKD-EPI Creatinine Equation (2021)    Anion gap 10 5 - 15    Comment: Performed at [facility], [address] [place], [place] [address]  CBC with Differential (Cancer Center Only)     Status: Abnormal   Collection Time: 02/11/24  9:31 AM  Result Value Ref Range   WBC Count 5.8 4.0 - 10.5 K/uL   RBC 3.98 (L) 4.22 - 5.81 MIL/uL   Hemoglobin 12.2 (L) 13.0 - 17.0 g/dL   HCT 63.6 (L) 60.9 - 47.9 %   MCV 91.2 80.0 - 100.0 fL   MCH 30.7 26.0 - 34.0 pg   MCHC 33.6 30.0 - 36.0 g/dL   RDW 86.4 88.4 - 84.4 %   Platelet Count 183 150 - 400 K/uL   nRBC 0.0 0.0 - 0.2 %   Neutrophils Relative % 63 %   Neutro Abs 3.6 1.7 - 7.7 K/uL   Lymphocytes Relative 25 %   Lymphs Abs 1.4 0.7 - 4.0 K/uL   Monocytes Relative 10 %   Monocytes Absolute 0.6 0.1 - 1.0 K/uL   Eosinophils Relative 2 %   Eosinophils Absolute 0.1 0.0 - 0.5 K/uL   Basophils Relative 0 %   Basophils Absolute 0.0 0.0 - 0.1 K/uL   Immature Granulocytes 0 %   Abs Immature Granulocytes 0.02 0.00 - 0.07 K/uL    Comment: Performed at [facility], [address] [place], [place] [address]  TSH     Status: None   Collection Time: 02/11/24  9:49 AM  Result Value Ref Range   TSH 1.260 0.450 - 4.500 uIU/mL  Pulmonary function test     Status: None (Preliminary result)   Collection Time: 03/18/24  1:26 PM  Result Value Ref Range   FVC-Pre 3.01 L   FVC-%Pred-Pre 79 %   FVC-Post 3.34 L   FVC-%Pred-Post 88 %   FVC-%Change-Post 10 %   FEV1-Pre 2.21 L   FEV1-%Pred-Pre 84 %   FEV1-Post 2.45 L   FEV1-%Pred-Post 93 %   FEV1-%Change-Post 10 %   FEV6-Pre 2.98 L   FEV6-%Pred-Pre 85 %   FEV6-Post 3.30 L   FEV6-%Pred-Post 94 %   FEV6-%Change-Post 10 %  Pre FEV1/FVC ratio 74 %    FEV1FVC-%Pred-Pre 104 %   Post FEV1/FVC ratio 73 %   FEV1FVC-%Change-Post 0 %   Pre FEV6/FVC Ratio 99 %   FEV6FVC-%Pred-Pre 106 %   Post FEV6/FVC ratio 99 %   FEV6FVC-%Pred-Post 106 %   FEV6FVC-%Change-Post 0 %   FEF 25-75 Pre 1.54 L/sec   FEF2575-%Pred-Pre 92 %   FEF 25-75 Post 2.50 L/sec   FEF2575-%Pred-Post 149 %   FEF2575-%Change-Post 61 %   RV 3.51 L   RV % pred 126 %   TLC 7.83 L   TLC % pred 110 %   DLCO unc 14.78 ml/min/mmHg   DLCO unc % pred 63 %   DLCO cor 15.98 ml/min/mmHg   DLCO cor % pred 68 %   DL/VA 7.24 ml/min/mmHg/L   DL/VA % pred 73 %  *Discussed PFTs with patient.        Assessment & Plan:     ICD-10-CM   1. Mild persistent asthma without complication  J45.30     2. Shortness of breath  R06.02     3. OSA (obstructive sleep apnea)  G47.33 Home sleep test      Orders Placed This Encounter  Procedures   Home sleep test    Standing Status:   Future    Expected Date:   04/01/2024    Expiration Date:   03/18/2025    Where should this test be performed::   [facility] - Pulmonary    Meds ordered this encounter  Medications   budesonide -formoterol  (SYMBICORT ) 160-4.5 MCG/ACT inhaler    Sig: Inhale 2 puffs into the lungs 2 (two) times daily.    Dispense:  10.2 g    Refill:  11   Discussion:    Asthma Poor control with occasional cough. Lung function is stable to modestly decline. Shortness of breath is multifactorial, involving cardiac and pulmonary components. - Encourage compliance with Symbicort  inhaler twice daily, two puffs in the morning and two in the evening. - Rewrote prescription for Symbicort . - Encouraged weight loss of 10 pounds to improve respiratory function.  Obstructive sleep apnea Managed with CPAP. Current CPAP machine is approximately 87 years old and may require replacement. - Checked into new CPAP equipment due to age of current machine. - Will need repeat sleep study, ordered. - Ensured follow-up with [employer] for CPAP parts  and equipment.     Follow up 4 months  Advised if symptoms do not improve or worsen, to please contact office for sooner follow up or seek emergency care.    I spent 35 minutes of dedicated to the care of this patient on the date of this encounter to include pre-visit review of records, face-to-face time with the patient discussing conditions above, post visit ordering of testing, clinical documentation with the electronic health record, making appropriate referrals as documented, and communicating necessary findings to members of the patients care team.     C. Leita Sanders, MD Advanced Bronchoscopy PCCM [facility]    *This note was generated using voice recognition software/Dragon and/or AI transcription program.  Despite best efforts to proofread, errors can occur which can change the meaning. Any transcriptional errors that result from this process are unintentional and may not be fully corrected at the time of dictation.     [1]  Allergies Allergen Reactions   Atorvastatin Other (See Comments)    Achy joints  [2]  Current Meds  Medication Sig   apixaban  (ELIQUIS ) 5 MG TABS tablet TAKE  1 TABLET BY MOUTH TWICE DAILY   Calcium  Carb-Cholecalciferol  (CALCIUM  + D3 PO) Take 1 tablet by mouth daily.   cholecalciferol  (VITAMIN D3) 25 MCG (1000 UNIT) tablet Take 1,000 Units by mouth daily.   cyanocobalamin  (VITAMIN B12) 1000 MCG tablet Take 1,000 mcg by mouth daily.   dronedarone  (MULTAQ ) 400 MG tablet Take 1 tablet (400 mg total) by mouth 2 (two) times daily with a meal.   Fe Bisgly-Vit C-Vit B12-FA (GENTLE IRON  PO) Take 28 mg by mouth daily.   furosemide  (LASIX ) 40 MG tablet Take 1 tablet (40 mg total) by mouth every Monday, Wednesday, and Friday.   isosorbide  mononitrate (IMDUR ) 30 MG 24 hr tablet Take 1 tablet (30 mg total) by mouth daily.   Lacosamide (VIMPAT) 100 MG TABS Take 100 mg by mouth in the morning and at bedtime.   losartan  (COZAAR ) 25 MG tablet Take  0.5 tablets (12.5 mg total) by mouth daily.   lovastatin  (MEVACOR ) 40 MG tablet Take 2 tablets (80 mg total) by mouth at bedtime.   metoprolol  succinate (TOPROL -XL) 25 MG 24 hr tablet Take 1 tablet (25 mg total) by mouth daily.   Multiple Vitamin (MULTI-VITAMINS) TABS Take 1 tablet by mouth daily.    pantoprazole  (PROTONIX ) 40 MG tablet Take 40 mg by mouth 2 (two) times daily.    "

## 2024-03-18 NOTE — Progress Notes (Signed)
 Full PFT completed today ? ?

## 2024-03-18 NOTE — Patient Instructions (Addendum)
 VISIT SUMMARY:  During your visit, we discussed your asthma and obstructive sleep apnea. You reported occasional cough and shortness of breath, which affects your daily activities. We reviewed your current medications and management strategies.  YOUR PLAN:  -ASTHMA: Asthma is a condition where your airways narrow and swell, producing extra mucus, which can make breathing difficult. You should use your Symbicort  inhaler twice daily, two puffs in the morning and two in the evening. This will help manage your symptoms and improve your lung function. Additionally, losing 10 pounds can help improve your respiratory function.  -OBSTRUCTIVE SLEEP APNEA: Obstructive sleep apnea is a condition where your breathing stops and starts during sleep due to blocked airways. You are managing this with a CPAP machine. Since your current machine is about 87 years old, it may need replacement.  Will arrange for a home sleep study to corroborate ongoing need for CPAP.  INSTRUCTIONS:  Please use your Symbicort  inhaler as prescribed and follow up with obtaining the sleep study. Additionally, aim to lose 10 pounds to help improve your respiratory function.

## 2024-03-19 ENCOUNTER — Encounter: Payer: Self-pay | Admitting: Pulmonary Disease

## 2024-03-23 NOTE — Progress Notes (Unsigned)
 "  Cardiology Office Note    Date:  03/24/2024   ID:  Nathaniel Thompson, DOB 11/14/1935, MRN [id number]  PCP:  Glendia Shad, MD  Cardiologist:  Deatrice Cage, MD  Electrophysiologist:  Fonda Kitty, MD   Chief Complaint: Follow up  History of Present Illness:   Nathaniel Thompson is a 89 y.o. male with history of CAD status post multiple PCI as outlined below, symptomatic bradycardia status post PPM in 03/2021, PAF/flutter on apixaban  status post DCCV in 12/2023, HFpEF, TIA, metastatic prostate cancer, AAA followed by vascular surgery, CKD stage III, provoked PE in 2023, HTN, HLD, and OSA on CPAP who presents for follow-up of CAD, HFpEF, and A-fib.  He has a known history of CAD status post stenting to the LCx and RCA.  He had PCI in 2003 at [facility].  He had repeat PCI x 2 at [facility] in 2004.  He was admitted to the hospital in 11/2017 with questionable tachycardia and A-fib.  Initially, he was placed on amiodarone  and anticoagulation, but all his EKGs revealed sinus rhythm at that time and both were discontinued.  Echo in 05/2018 showed normal LV systolic function and grade 1 diastolic dysfunction with severely dilated left atrium, mild mitral and tricuspid regurgitation, and mild pulmonary hypertension.  In 02/2021, he had worsening shortness of breath and chest pain.  LHC showed patent stents in the LCx and RCA with no obstructive CAD.  RHC showed mildly elevated filling pressures, mild pulmonary hypertension, and normal cardiac output.  During the procedure he was noted to have intermittent bradycardia with heart rate going into the low 40s bpm.  Subsequent outpatient monitor showed frequent episodes of SVT.  He was referred to EP and underwent pacemaker implantation in 03/2021.  In late 2023, he fell from a deer stand and fractured his left posterior calcaneus.  He was subsequently admitted to [facility] for shortness of breath, cough, and fever in the setting of displaced fracture of the left posterior  calcaneus.  He was found to have RSV and lobar and segmental pulmonary embolism.  He was also noted to have episodes of tachycardia and found to be in A-fib with RVR that lasted for about 45 minutes.  He was placed on anticoagulation and metoprolol .  He was admitted to [facility] in 02/2023 with slurred speech, right arm weakness, and suspected stroke.  tPA was not given (on Eliquis ).  He was hypertensive on presentation.  MRI of the brain showed no acute stroke.  Echo showed normal LV systolic function with no significant valvular abnormalities.  Episode was thought to be a TIA.   He was seen in 07/2023 with increased shortness of breath and decreased exercise tolerance.  Myocardial PET showed anterolateral and inferior lateral ischemia.  Subsequent LHC in 07/2023 showed patent RCA and LCx stents with minimal restenosis.  There was mild nonobstructive disease overall.  Echo showed normal LV systolic function with grade 1 diastolic dysfunction and mild mitral regurgitation.  He was seen in the office on 09/21/2023 reporting a 3 to 4-day history of fatigue and tachycardic rates.  He was found to be in atrial flutter with 2-1 AV block.  He had been adherent to pharmacotherapy including anticoagulation.  ATP was attempted in the clinic without success and he was transferred to the ED.  Workup in the ER showed nonacute chest x-ray and a normal troponin.  While preparing for DCCV, he spontaneously converted to sinus rhythm, though redeveloped atrial flutter after about 10 to 15  minutes with rates in the 130s bpm leading to successful DCCV.  He was last seen by general cardiology in 09/2023 with EKG showing atrial paced rhythm with occasional AV pacing.  He reported a several month history of intermittent shortness of breath.  He was advised to take furosemide  40 mg daily for 4 days transition of furosemide  to 40 mg on Mondays, Wednesdays, and Fridays to simplify dosing and with difficulty in taking furosemide  on Sundays.  It was  recommended he follow-up with the EP to determine if he was having paroxysms of atrial arrhythmia contributing to dyspnea and volume overload.  Device interrogation in 10/2023 showed low atrial arrhythmia burden except for mid 09/2023 with recommendation to continue to monitor over initiation of arrhythmic therapy.   He was evaluated by pulmonary in 11/2023 with shortness of breath, cough, and wheezing following COVID infection with recommendation for steroid taper, Symbicort , and albuterol .  He was seen in his PCP's office on 01/02/2024 noted to have a heart rate of 140 bpm with EKG demonstrating atrial flutter with RVR.  He was given metoprolol  25 mg without improvement in rates.  Initially, he was without escalation in symptoms, though with persistent tachycardic rates he became increasingly fatigued and had some brief chest discomfort.  In this setting he was transferred to the [facility] ED where he remained in atrial flutter with RVR.  He was given IV metoprolol  with heart rates in the 80s to low 100s bpm, remaining in atrial flutter.  He had been compliant with anticoagulation and underwent successful DCCV in the ED with conversion to sinus rhythm.  AAA ultrasound in 01/2024 showed the largest aortic measurement of 3.2 cm (previously 2.8 cm in 01/2023).  Vascular ultrasound in 01/2024 showed no popliteal artery aneurysm bilaterally.   Device interrogation in 01/2024 showed an increase in overall A-fib/flutter burden at 7.3%  He followed up with general cardiology on 01/23/2024 and was doing well from a cardiac perspective with some positional dizziness.  He was referred back to EP for ongoing management of symptomatic A-fib/flutter.  He was evaluated by EP on 02/07/2024 with recommendation to start Multaq .  He comes in today accompanied by his wife and is doing well from a cardiac perspective.  No symptoms of angina or cardiac decompensation.  Continues to note intermittent brief palpitations.  Also continues  to note exertional fatigue as well as positional dizziness with some associated bilateral facial tingling that is present when changing positions.  No near-syncope or syncope.  Otherwise, no neurological deficits.  No falls.  No symptoms concerning for bleeding.  His weight is up 2 pounds when compared to his visit with EP last month.  He does report increased snacking over the holidays.   Labs independently reviewed: 02/2024 - TSH normal, Hgb 12.2, PLT 183, potassium 4.1, BUN 23, serum creatinine 1.35, albumin 3.9, AST/ALT normal 12/2023 - high-sensitivity troponin 22 with a delta troponin 25, magnesium  2.0, TC 129, TG 121, HDL 35, LDL 70, A1c 5.4,    Past Medical History:  Diagnosis Date   (HFpEF) heart failure with preserved ejection fraction (HCC)    a. 05/2018 Echo: Nl EF, Gr1 DD, sev dil LA, mild MR/TR, mild PAH; b. 05/2019 Echo: EF 55-60%, no rwma, mild LVH. Nl PASP. Mildly dil LA. Triv MR. Mild AoV sclerosis w/o stenosis. Mildly dil Asc AO (39mm); d. 02/2023 Echo [facility]): EF >55%, nl RV fxn, mild PR.   AAA (abdominal aortic aneurysm)    Allergic rhinitis    Barrett's  esophagus 10/21/2013   Bone cancer (HCC)    Cardiac pacemaker in situ    CKD (chronic kidney disease), stage III (HCC)    Colon polyp, hyperplastic    COPD (chronic obstructive pulmonary disease) (HCC)    mild COPD. former smoker   Coronary artery disease    a. 2003 s/p PCI ([facility]); b. 2004 s/p PCI x 2 ([facility]); c. 11/2017 Cath: LM nl, LAD mild dzs, LCX patent stent w/ 30% distal edge restenosis, RCA patent distal stent w/ 30% prox edge stenosis. Nl EF->Med Rx; d. 02/2021 Cath: LM nl, LAD min irregs, D1/2 min irregs, LCX 10p ISR, 107m, RCA 52m/d ISR, EF 65%; e. 07/2023 PET Stress: antlat and inflat ischemia.   Diverticulosis    Fundic gland polyps of stomach, benign    Gastritis    GERD (gastroesophageal reflux disease)    Hypercholesteremia    Hypertension    PAF (paroxysmal atrial fibrillation) (HCC)    a. CHA2DS2VASc =  7-->eliquis .   Prostate cancer (HCC)    Prostatism    Pulmonary embolism (HCC)    a. 2023 - lobar and segmental PE in setting of RSV.   Reflux esophagitis    Renal stones    Skin cancer    left cheek/lesion excised   Sleep apnea    Symptomatic bradycardia    Tachy-brady syndrome (HCC)    a. 03/2021 s/p MDT MRI compatible DC PPM (ser # [id number]).   Tachycardia    a. 11/2017 admit w/ tachycardia/? afib-->no afib noted upon review (amio/OAC d/c'd).   TIA (transient ischemic attack)    a. 02/2023 admit [facility] - no stroke on MRI; b. 06/2022 EEG: nl for age.    Past Surgical History:  Procedure Laterality Date   CARDIAC CATHETERIZATION     COLONOSCOPY WITH PROPOFOL  N/A 09/13/2015   Procedure: COLONOSCOPY WITH PROPOFOL ;  Surgeon: Gladis RAYMOND Mariner, MD;  Location: [facility] ENDOSCOPY;  Service: Endoscopy;  Laterality: N/A;   COLONOSCOPY WITH PROPOFOL  N/A 05/07/2019   Procedure: COLONOSCOPY WITH PROPOFOL ;  Surgeon: Dessa Reyes ORN, MD;  Location: [facility] ENDOSCOPY;  Service: Endoscopy;  Laterality: N/A;   CORONARY ANGIOPLASTY     ESOPHAGOGASTRODUODENOSCOPY (EGD) WITH PROPOFOL  N/A 09/13/2015   Procedure: ESOPHAGOGASTRODUODENOSCOPY (EGD) WITH PROPOFOL ;  Surgeon: Gladis RAYMOND Mariner, MD;  Location: [facility] ENDOSCOPY;  Service: Endoscopy;  Laterality: N/A;   ESOPHAGOGASTRODUODENOSCOPY (EGD) WITH PROPOFOL  N/A 12/28/2015   Procedure: ESOPHAGOGASTRODUODENOSCOPY (EGD) WITH PROPOFOL ;  Surgeon: Gladis RAYMOND Mariner, MD;  Location: [facility] ENDOSCOPY;  Service: Endoscopy;  Laterality: N/A;   ESOPHAGOGASTRODUODENOSCOPY (EGD) WITH PROPOFOL  N/A 06/16/2016   Procedure: ESOPHAGOGASTRODUODENOSCOPY (EGD) WITH PROPOFOL ;  Surgeon: Gladis RAYMOND Mariner, MD;  Location: [facility] ENDOSCOPY;  Service: Endoscopy;  Laterality: N/A;   ESOPHAGOGASTRODUODENOSCOPY (EGD) WITH PROPOFOL  N/A 05/07/2019   Procedure: ESOPHAGOGASTRODUODENOSCOPY (EGD) WITH PROPOFOL ;  Surgeon: Dessa Reyes ORN, MD;  Location: [facility] ENDOSCOPY;  Service: Endoscopy;  Laterality: N/A;    EYE SURGERY  2013   CATARACT EXTRACTION   GANGLION CYST EXCISION Right 05/18/2017   Procedure: REMOVAL GANGLION CYST ANKLE;  Surgeon: Ashley Soulier, DPM;  Location: [facility] ORS;  Service: Podiatry;  Laterality: Right;   heart cath stent     LEFT HEART CATH AND CORONARY ANGIOGRAPHY Right 12/03/2017   Procedure: Left Heart Cath and Coronary Angiography with possible coronary intervention;  Surgeon: Fernand Denyse LABOR, MD;  Location: [facility] INVASIVE CV LAB;  Service: Cardiovascular;  Laterality: Right;   LEFT HEART CATH AND CORONARY ANGIOGRAPHY Left 07/27/2023   Procedure: LEFT HEART CATH AND CORONARY ANGIOGRAPHY;  Surgeon: Darron Deatrice LABOR, MD;  Location: [facility] INVASIVE CV LAB;  Service: Cardiovascular;  Laterality: Left;   PACEMAKER IMPLANT N/A 04/04/2021   Procedure: PACEMAKER IMPLANT;  Surgeon: Fernande Elspeth BROCKS, MD;  Location: [facility] INVASIVE CV LAB;  Service: Cardiovascular;  Laterality: N/A;   PROSTATE BIOPSY     RIGHT/LEFT HEART CATH AND CORONARY ANGIOGRAPHY N/A 02/07/2021   Procedure: RIGHT/LEFT HEART CATH AND CORONARY ANGIOGRAPHY;  Surgeon: Darron Deatrice LABOR, MD;  Location: [facility] INVASIVE CV LAB;  Service: Cardiovascular;  Laterality: N/A;   stents     multiple    Current Medications: Active Medications[1]  Allergies:   Atorvastatin   Social History   Socioeconomic History   Marital status: Married    Spouse name: Glenda   Number of children: 2   Years of education: Not on file   Highest education level: Not on file  Occupational History   Occupation: retired production designer, theatre/television/film for [employer]  Tobacco Use   Smoking status: Former    Current packs/day: 0.00    Average packs/day: 1 pack/day for 35.0 years (35.0 ttl pk-yrs)    Types: Cigarettes    Start date: 03/07/1951    Quit date: 03/06/1986    Years since quitting: 38.0   Smokeless tobacco: Former    Types: Chew    Quit date: 1985   Tobacco comments:    Started smoking around 89 yrs old.    Smoked 1 PPD at his heaviest.  Vaping Use    Vaping status: Never Used  Substance and Sexual Activity   Alcohol use: No   Drug use: No   Sexual activity: Not Currently  Other Topics Concern   Not on file  Social History Narrative   Married, Gaetana 2 step children and 2 children from previous marriage   Social Drivers of Health   Tobacco Use: Medium Risk (03/24/2024)   Patient History    Smoking Tobacco Use: Former    Smokeless Tobacco Use: Former    Passive Exposure: Not on Actuary Strain: Low Risk (10/31/2023)   Overall Financial Resource Strain (CARDIA)    Difficulty of Paying Living Expenses: Not hard at all  Food Insecurity: No Food Insecurity (10/31/2023)   Epic    Worried About Radiation Protection Practitioner of Food in the Last Year: Never true    Ran Out of Food in the Last Year: Never true  Transportation Needs: No Transportation Needs (10/31/2023)   Epic    Lack of Transportation (Medical): No    Lack of Transportation (Non-Medical): No  Physical Activity: Insufficiently Active (10/31/2023)   Exercise Vital Sign    Days of Exercise per Week: 4 days    Minutes of Exercise per Session: 10 min  Stress: No Stress Concern Present (10/31/2023)   [employer] of Occupational Health - Occupational Stress Questionnaire    Feeling of Stress: Not at all  Social Connections: Socially Integrated (10/31/2023)   Social Connection and Isolation Panel    Frequency of Communication with Friends and Family: More than three times a week    Frequency of Social Gatherings with Friends and Family: More than three times a week    Attends Religious Services: More than 4 times per year    Active Member of Clubs or Organizations: Yes    Attends Banker Meetings: More than 4 times per year    Marital Status: Married  Depression (PHQ2-9): Low Risk (02/14/2024)   Depression (PHQ2-9)    PHQ-2 Score: 2  Alcohol  Screen: Low Risk (10/31/2023)   Alcohol Screen    Last Alcohol Screening Score (AUDIT): 0  Housing: Unknown  (10/31/2023)   Epic    Unable to Pay for Housing in the Last Year: No    Number of Times Moved in the Last Year: Not on file    Homeless in the Last Year: No  Utilities: Not At Risk (10/31/2023)   Epic    Threatened with loss of utilities: No  Health Literacy: Adequate Health Literacy (10/31/2023)   B1300 Health Literacy    Frequency of need for help with medical instructions: Never     Family History:  The patient's family history includes Cancer in his brother, brother, daughter, father, mother, sister, and another family member; Stroke in his father.  ROS:   12-point review of systems is negative unless otherwise noted in the HPI.   EKGs/Labs/Other Studies Reviewed:    Studies reviewed were summarized above. The additional studies were reviewed today:  2D echo 08/10/2023: 1. Left ventricular ejection fraction, by estimation, is 55 to 60%. The  left ventricle has normal function. The left ventricle has no regional  wall motion abnormalities. Left ventricular diastolic parameters are  consistent with Grade I diastolic  dysfunction (impaired relaxation).   2. Right ventricular systolic function is normal. The right ventricular  size is normal. Tricuspid regurgitation signal is inadequate for assessing  PA pressure.   3. Right atrial size was mildly dilated.   4. The mitral valve is degenerative. Mild mitral valve regurgitation. No  evidence of mitral stenosis.   5. The aortic valve was not well visualized. Aortic valve regurgitation  is not visualized. No aortic stenosis is present.  __________   [facility] 07/27/2023:   Prox Cx to Mid Cx lesion is 10% stenosed.   Mid Cx lesion is 30% stenosed.   Mid RCA to Dist RCA lesion is 5% stenosed.   Prox RCA lesion is 20% stenosed.   Ost Cx to Prox Cx lesion is 30% stenosed.   1.  Patent RCA and left circumflex stents with minimal restenosis.  Mild nonobstructive disease overall. 2.  I could not cross the aortic valve due to significant  tortuosity of the innominate artery.   Recommendations: Continue medical therapy. Obtain an echocardiogram to evaluate systolic and diastolic function. Avoid catheterization via the right radial artery in the future due to significant tortuosity and calcification of the innominate artery. __________   Myocardial PET/CT 07/12/2023:   LV perfusion is abnormal. There is evidence of ischemia. There is no evidence of infarction. Defect 1: There is a medium defect with mild reduction in uptake present in the mid to basal anterolateral and inferolateral location(s) that is reversible. There is normal wall motion in the defect area. Consistent with ischemia.   End diastolic cavity size is normal. End systolic cavity size is normal.   Myocardial blood flow was computed to be 0.24ml/g/min at rest and 1.28ml/g/min at stress. Global myocardial blood flow reserve was 1.68 and was abnormal.   Coronary calcium  assessment not performed due to prior revascularization. Prior stents   Findings are consistent with ischemia. The study is intermediate risk.   Ischemia noted in the mid-basal inferiorlateral/anterolateral walls MBFR abnormal in this area 1.6 __________   Zio patch 02/07/2021: Patient had a min HR of 42 bpm, max HR of 203 bpm, and avg HR of 58 bpm.  Predominant underlying rhythm was Sinus Rhythm.Slight P wave morphology changes were noted.1 run of Ventricular Tachycardia occurred lasting 6  beats with a max rate of 152 bpm (avg 138 bpm).  103 Supraventricular Tachycardia runs occurred, the run with the fastest interval lasting 6 beats with a max rate of 203 bpm, the longest lasting 16.7 secs with an avg rate of 93 bpm. Atrial Fibrillation occurred (<1% burden), ranging from  103-167 bpm (avg of 133 bpm), the longest lasting 5 mins 51 secs with an avg rate of 139 bpm. Junctional Rhythm was present. Atrial Fibrillation and Junctional Rhythm were detected within +/- 45 seconds of symptomatic patient event(s).   Rare PACs and occasional PVCs. __________   [facility] 02/07/2021:   Prox Cx to Mid Cx lesion is 10% stenosed.   Mid Cx lesion is 30% stenosed.   Mid RCA to Dist RCA lesion is 5% stenosed.   The left ventricular systolic function is normal.   LV end diastolic pressure is mildly elevated.   The left ventricular ejection fraction is 55-65% by visual estimate.   1.  Patent stents in the left circumflex and right coronary artery with no evidence of obstructive coronary artery disease. 2.  Right heart catheterization showed mildly elevated filling pressures, mild pulmonary hypertension and normal cardiac output. 3.  Normal left ventricular systolic function.   Recommendations: No culprit is identified for the patient's chest pain.  Suspect possible GI etiology.  The patient is mildly volume overloaded likely due to some degree of diastolic heart failure.  A small dose diuretic can be considered.   The patient was noted to have intermittent bradycardia during cardiac cath with a heart rate down to the low 40s.  He reports recent episodes of dizziness and presyncope and thus we will obtain a 2-week outpatient monitor to evaluate the need for a pacemaker. __________   2D echo 05/26/2019: 1. Left ventricular ejection fraction, by estimation, is 55 to 60%. The  left ventricle has normal function. The left ventricle has no regional  wall motion abnormalities. There is mild left ventricular hypertrophy.  Left ventricular diastolic parameters  were normal.   2. Right ventricular systolic function is normal. The right ventricular  size is normal. There is normal pulmonary artery systolic pressure.   3. Left atrial size was mildly dilated.   4. The mitral valve is normal in structure. Trivial mitral valve  regurgitation. No evidence of mitral stenosis.   5. The aortic valve is normal in structure. Aortic valve regurgitation is  not visualized. Mild aortic valve sclerosis is present, with no evidence   of aortic valve stenosis.   6. Aortic dilatation noted. There is mild dilatation of the ascending  aorta measuring 39 mm.   7. The inferior vena cava is normal in size with greater than 50%  respiratory variability, suggesting right atrial pressure of 3 mmHg.   Comparison(s): Prior Echo showed LV EF 55%, no RWMA, grade I diastolic  dysfunction, and mild LAE. Mild MR/TR.  __________   LHC 12/03/2017 [place]): Ost 1st Mrg to 1st Mrg lesion is 40% stenosed. Prox LAD lesion is 30% stenosed. Prox RCA to Mid RCA lesion is 30% stenosed. Dist RCA lesion is 20% stenosed. Ost Cx to Prox Cx lesion is 30% stenosed.   No significant CAD with normal LVEF, but Heart rate during cath was 48, and will decrease amiodrone to 200 daily after holding for few days and get holter monitor. __________   2D echo 11/27/2017: - Left ventricle: The cavity size was normal. There was mild    concentric hypertrophy. Systolic function was normal. The  estimated ejection fraction was 55%. Wall motion was normal;    there were no regional wall motion abnormalities. Doppler    parameters are consistent with abnormal left ventricular    relaxation (grade 1 diastolic dysfunction).  - Aortic valve: Valve area (Vmax): 2.1 cm^2.  - Left atrium: The atrium was mildly dilated.  __________   See CV Studies in Epic for more remote cardiac imaging   EKG:  EKG is ordered today.  The EKG ordered today demonstrates A-paced rhythm with prolonged AV conduction  Recent Labs: 01/02/2024: Magnesium  2.0 02/11/2024: ALT 16; BUN 23; Creatinine 1.35; Hemoglobin 12.2; Platelet Count 183; Potassium 4.1; Sodium 140; TSH 1.260  Recent Lipid Panel    Component Value Date/Time   CHOL 129 12/28/2023 0737   CHOL 103 09/16/2013 0438   TRIG 121.0 12/28/2023 0737   TRIG 95 09/16/2013 0438   HDL 35.30 (L) 12/28/2023 0737   HDL 30 (L) 09/16/2013 0438   CHOLHDL 4 12/28/2023 0737   VLDL 24.2 12/28/2023 0737   VLDL 19 09/16/2013 0438    LDLCALC 70 12/28/2023 0737   LDLCALC 54 09/16/2013 0438    PHYSICAL EXAM:    VS:  BP (!) 148/80 (BP Location: Left Arm, Patient Position: Sitting, Cuff Size: Normal)   Pulse 77 Comment: 80 oximeter  Ht 5' 10 (1.778 m)   Wt 222 lb 12.8 oz (101.1 kg)   SpO2 98%   BMI 31.97 kg/m   BMI: Body mass index is 31.97 kg/m.  Physical Exam Vitals reviewed.  Constitutional:      Appearance: He is well-developed.  HENT:     Head: Normocephalic and atraumatic.  Eyes:     General:        Right eye: No discharge.        Left eye: No discharge.  Cardiovascular:     Rate and Rhythm: Normal rate and regular rhythm.     Pulses:          Posterior tibial pulses are 2+ on the right side and 2+ on the left side.     Heart sounds: Normal heart sounds, S1 normal and S2 normal. Heart sounds not distant. No midsystolic click and no opening snap. No murmur heard.    No friction rub.  Pulmonary:     Effort: Pulmonary effort is normal. No respiratory distress.     Breath sounds: Normal breath sounds. No decreased breath sounds, wheezing, rhonchi or rales.  Musculoskeletal:     Cervical back: Normal range of motion.     Comments: Trivial bilateral pretibial edema.  Skin:    General: Skin is warm and dry.     Nails: There is no clubbing.  Neurological:     Mental Status: He is alert and oriented to person, place, and time.  Psychiatric:        Speech: Speech normal.        Behavior: Behavior normal.        Thought Content: Thought content normal.        Judgment: Judgment normal.     Wt Readings from Last 3 Encounters:  03/24/24 222 lb 12.8 oz (101.1 kg)  03/18/24 223 lb 6 oz (101.3 kg)  03/18/24 223 lb 9.6 oz (101.4 kg)     ASSESSMENT & PLAN:   CAD involving the native coronary arteries without angina: He is without symptoms of angina or cardiac decompensation.  Recent LHC in 07/2023 showed patent LCx and RCA stents with otherwise nonobstructive disease.  He remains  on apixaban  in lieu of  aspirin .  Continue Imdur  30 mg and lovastatin  40 mg.  No indication for further ischemic testing at this time.  HFpEF: Volume status is difficult to assess on physical exam secondary to body habitus, though suspect he is mildly volume up.  Currently on furosemide  40 mg Mondays, Wednesdays, and Fridays.  Not on MRA with underlying renal dysfunction.  Update BMP with consideration for addition of SGLT2 inhibitor unless escalation of outpatient diuresis based on results.  Will need to be mindful of possible off target effects with underlying orthostasis.  Cannot exclude some exacerbation of volume status in the context of A-fib burden.  A-fib/flutter: Atrial paced rhythm.  Remains on Toprol -XL 25 mg as well as Multaq  400 mg twice daily.  Await updated device interrogation to evaluate for improvement in atrial arrhythmia burden on antiarrhythmic therapy with ongoing management per EP.  CHA2DS2-VASc at least 7 (CHF, HTN, age x 2, TIA x 2, vascular disease).  Remains on apixaban  5 mg twice daily and does not meet reduced dosing criteria.  Check CBC and BMP.  Symptomatic bradycardia: Status post PPM.  Management per EP.  HTN: Blood pressure is mildly elevated in the office today, though typically well-controlled.  With underlying orthostasis we deferred escalation of pharmacotherapy at this time.  Anticipate gentle diuresis as outlined above.  Remains on Imdur  30 mg, losartan  12.5 mg, and Toprol -XL 25 mg.  HLD: LDL 70 in 12/2023.  Remains on lovastatin  80 mg.  AAA/popliteal artery aneurysm: Ultrasound in 01/2024 demonstrated a largest aortic measurement of 3.2 cm (previously 2.8 cm in 01/2023).  Lower extremity ultrasound showed no evidence of popliteal artery aneurysm bilaterally.  Followed by vascular surgery.  Carotid artery stenosis: Ultrasound in 01/2023 showed 1 to 39% bilateral ICA stenosis.  Remains on lovastatin  and on apixaban  in lieu of aspirin .  Followed by vascular surgery.  CKD stage III:  Check BMP.  Dyspnea: Multifactorial including underlying CAD, HFpEF, A-fib/flutter, asthma, OSA with possible OHS, and obesity/physical deconditioning.  Management from a cardiac perspective as outlined above.  Continue to follow-up with pulmonology with plans for updated sleep study.  Heart healthy diet recommended.     Disposition: F/u with Dr. Darron or an APP in 4 months, and EP as directed.    Medication Adjustments/Labs and Tests Ordered: Current medicines are reviewed at length with the patient today.  Concerns regarding medicines are outlined above. Medication changes, Labs and Tests ordered today are summarized above and listed in the Patient Instructions accessible in Encounters.   Signed, Bernardino Bring, PA-C 03/24/2024 9:05 AM     [facility] [place] [address] [place], [place] [address] [phone]     [1]  Current Meds  Medication Sig   albuterol  (VENTOLIN  HFA) 108 (90 Base) MCG/ACT inhaler Inhale into the lungs every 6 (six) hours as needed for wheezing or shortness of breath.   apixaban  (ELIQUIS ) 5 MG TABS tablet TAKE 1 TABLET BY MOUTH TWICE DAILY   budesonide -formoterol  (SYMBICORT ) 160-4.5 MCG/ACT inhaler Inhale 2 puffs into the lungs 2 (two) times daily.   Calcium  Carb-Cholecalciferol  (CALCIUM  + D3 PO) Take 1 tablet by mouth daily.   cholecalciferol  (VITAMIN D3) 25 MCG (1000 UNIT) tablet Take 1,000 Units by mouth daily.   cyanocobalamin  (VITAMIN B12) 1000 MCG tablet Take 1,000 mcg by mouth daily.   dronedarone  (MULTAQ ) 400 MG tablet Take 1 tablet (400 mg total) by mouth 2 (two) times daily with a meal.  Fe Bisgly-Vit C-Vit B12-FA (GENTLE IRON  PO) Take 28 mg by mouth daily.   furosemide  (LASIX ) 40 MG tablet Take 1 tablet (40 mg total) by mouth every Monday, Wednesday, and Friday.   isosorbide  mononitrate (IMDUR ) 30 MG 24 hr tablet Take 1 tablet (30 mg total) by mouth daily.   Lacosamide (VIMPAT) 100 MG TABS Take 100 mg by mouth in the  morning and at bedtime.   losartan  (COZAAR ) 25 MG tablet Take 0.5 tablets (12.5 mg total) by mouth daily.   lovastatin  (MEVACOR ) 40 MG tablet Take 2 tablets (80 mg total) by mouth at bedtime.   metoprolol  succinate (TOPROL -XL) 25 MG 24 hr tablet Take 1 tablet (25 mg total) by mouth daily.   Multiple Vitamin (MULTI-VITAMINS) TABS Take 1 tablet by mouth daily.    pantoprazole  (PROTONIX ) 40 MG tablet Take 40 mg by mouth 2 (two) times daily.    "

## 2024-03-24 ENCOUNTER — Encounter: Payer: Self-pay | Admitting: Physician Assistant

## 2024-03-24 ENCOUNTER — Ambulatory Visit: Attending: Physician Assistant | Admitting: Physician Assistant

## 2024-03-24 VITALS — BP 148/80 | HR 77 | Ht 70.0 in | Wt 222.8 lb

## 2024-03-24 DIAGNOSIS — I2510 Atherosclerotic heart disease of native coronary artery without angina pectoris: Secondary | ICD-10-CM

## 2024-03-24 DIAGNOSIS — R001 Bradycardia, unspecified: Secondary | ICD-10-CM

## 2024-03-24 DIAGNOSIS — Z950 Presence of cardiac pacemaker: Secondary | ICD-10-CM

## 2024-03-24 DIAGNOSIS — I5032 Chronic diastolic (congestive) heart failure: Secondary | ICD-10-CM | POA: Diagnosis not present

## 2024-03-24 DIAGNOSIS — I10 Essential (primary) hypertension: Secondary | ICD-10-CM | POA: Diagnosis not present

## 2024-03-24 DIAGNOSIS — I6523 Occlusion and stenosis of bilateral carotid arteries: Secondary | ICD-10-CM | POA: Diagnosis not present

## 2024-03-24 DIAGNOSIS — I4892 Unspecified atrial flutter: Secondary | ICD-10-CM | POA: Diagnosis not present

## 2024-03-24 DIAGNOSIS — E785 Hyperlipidemia, unspecified: Secondary | ICD-10-CM | POA: Diagnosis not present

## 2024-03-24 DIAGNOSIS — I714 Abdominal aortic aneurysm, without rupture, unspecified: Secondary | ICD-10-CM

## 2024-03-24 DIAGNOSIS — I480 Paroxysmal atrial fibrillation: Secondary | ICD-10-CM | POA: Diagnosis not present

## 2024-03-24 DIAGNOSIS — N183 Chronic kidney disease, stage 3 unspecified: Secondary | ICD-10-CM

## 2024-03-24 DIAGNOSIS — Z79899 Other long term (current) drug therapy: Secondary | ICD-10-CM

## 2024-03-24 DIAGNOSIS — I724 Aneurysm of artery of lower extremity: Secondary | ICD-10-CM | POA: Diagnosis not present

## 2024-03-24 DIAGNOSIS — D6869 Other thrombophilia: Secondary | ICD-10-CM

## 2024-03-24 NOTE — Patient Instructions (Signed)
 Medication Instructions:  Your physician recommends that you continue on your current medications as directed. Please refer to the Current Medication list given to you today.   *If you need a refill on your cardiac medications before your next appointment, please call your pharmacy*  Lab Work: Your provider would like for you to have following labs drawn today CBC and BMeT.   If you have labs (blood work) drawn today and your tests are completely normal, you will receive your results only by: MyChart Message (if you have MyChart) OR A paper copy in the mail If you have any lab test that is abnormal or we need to change your treatment, we will call you to review the results.  Testing/Procedures: None ordered at this time   Follow-Up: At [facility], you and your health needs are our priority.  As part of our continuing mission to provide you with exceptional heart care, our providers are all part of one team.  This team includes your primary Cardiologist (physician) and Advanced Practice Providers or APPs (Physician Assistants and Nurse Practitioners) who all work together to provide you with the care you need, when you need it.  Your next appointment:   4 month(s)  Provider:   You may see Deatrice Cage, MD or Bernardino Bring, PA-C   We recommend signing up for the patient portal called MyChart.  Sign up information is provided on this After Visit Summary.  MyChart is used to connect with patients for Virtual Visits (Telemedicine).  Patients are able to view lab/test results, encounter notes, upcoming appointments, etc.  Non-urgent messages can be sent to your provider as well.   To learn more about what you can do with MyChart, go to [email or web address].

## 2024-03-25 ENCOUNTER — Other Ambulatory Visit: Payer: Self-pay | Admitting: Emergency Medicine

## 2024-03-25 ENCOUNTER — Ambulatory Visit: Payer: Self-pay | Admitting: Physician Assistant

## 2024-03-25 DIAGNOSIS — Z79899 Other long term (current) drug therapy: Secondary | ICD-10-CM

## 2024-03-25 LAB — BASIC METABOLIC PANEL WITH GFR
BUN/Creatinine Ratio: 12 (ref 10–24)
BUN: 17 mg/dL (ref 8–27)
CO2: 20 mmol/L (ref 20–29)
Calcium: 9.8 mg/dL (ref 8.6–10.2)
Chloride: 106 mmol/L (ref 96–106)
Creatinine, Ser: 1.38 mg/dL — ABNORMAL HIGH (ref 0.76–1.27)
Glucose: 94 mg/dL (ref 70–99)
Potassium: 4.8 mmol/L (ref 3.5–5.2)
Sodium: 143 mmol/L (ref 134–144)
eGFR: 50 mL/min/1.73 — ABNORMAL LOW

## 2024-03-25 LAB — CBC
Hematocrit: 36.2 % — ABNORMAL LOW (ref 37.5–51.0)
Hemoglobin: 11.7 g/dL — ABNORMAL LOW (ref 13.0–17.7)
MCH: 30.6 pg (ref 26.6–33.0)
MCHC: 32.3 g/dL (ref 31.5–35.7)
MCV: 95 fL (ref 79–97)
Platelets: 212 x10E3/uL (ref 150–450)
RBC: 3.82 x10E6/uL — ABNORMAL LOW (ref 4.14–5.80)
RDW: 13.1 % (ref 11.6–15.4)
WBC: 5.4 x10E3/uL (ref 3.4–10.8)

## 2024-03-25 MED ORDER — EMPAGLIFLOZIN 10 MG PO TABS: 10.0000 mg | ORAL_TABLET | Freq: Every day | ORAL | 11 refills | Status: AC

## 2024-03-25 MED ORDER — FUROSEMIDE 20 MG PO TABS: 20.0000 mg | ORAL_TABLET | ORAL | 3 refills | Status: AC

## 2024-03-31 ENCOUNTER — Other Ambulatory Visit: Payer: Self-pay

## 2024-03-31 ENCOUNTER — Emergency Department
Admission: EM | Admit: 2024-03-31 | Discharge: 2024-03-31 | Disposition: A | Discharge status: Home / Self Care | Attending: Emergency Medicine | Admitting: Emergency Medicine

## 2024-03-31 DIAGNOSIS — I4892 Unspecified atrial flutter: Secondary | ICD-10-CM | POA: Insufficient documentation

## 2024-03-31 DIAGNOSIS — Z0189 Encounter for other specified special examinations: Secondary | ICD-10-CM

## 2024-03-31 DIAGNOSIS — R42 Dizziness and giddiness: Secondary | ICD-10-CM | POA: Diagnosis present

## 2024-03-31 LAB — CBC WITH DIFFERENTIAL/PLATELET
Abs Immature Granulocytes: 0.01 10*3/uL (ref 0.00–0.07)
Basophils Absolute: 0 10*3/uL (ref 0.0–0.1)
Basophils Relative: 1 %
Eosinophils Absolute: 0.1 10*3/uL (ref 0.0–0.5)
Eosinophils Relative: 2 %
HCT: 34.9 % — ABNORMAL LOW (ref 39.0–52.0)
Hemoglobin: 11.9 g/dL — ABNORMAL LOW (ref 13.0–17.0)
Immature Granulocytes: 0 %
Lymphocytes Relative: 21 %
Lymphs Abs: 1.2 10*3/uL (ref 0.7–4.0)
MCH: 30.7 pg (ref 26.0–34.0)
MCHC: 34.1 g/dL (ref 30.0–36.0)
MCV: 89.9 fL (ref 80.0–100.0)
Monocytes Absolute: 0.6 10*3/uL (ref 0.1–1.0)
Monocytes Relative: 10 %
Neutro Abs: 3.8 10*3/uL (ref 1.7–7.7)
Neutrophils Relative %: 66 %
Platelets: 221 10*3/uL (ref 150–400)
RBC: 3.88 MIL/uL — ABNORMAL LOW (ref 4.22–5.81)
RDW: 13.5 % (ref 11.5–15.5)
WBC: 5.7 10*3/uL (ref 4.0–10.5)
nRBC: 0 % (ref 0.0–0.2)

## 2024-03-31 LAB — TROPONIN T, HIGH SENSITIVITY: Troponin T High Sensitivity: 18 ng/L (ref 0–19)

## 2024-03-31 LAB — BASIC METABOLIC PANEL WITH GFR
Anion gap: 12 (ref 5–15)
BUN: 19 mg/dL (ref 8–23)
CO2: 21 mmol/L — ABNORMAL LOW (ref 22–32)
Calcium: 9.6 mg/dL (ref 8.9–10.3)
Chloride: 109 mmol/L (ref 98–111)
Creatinine, Ser: 1.46 mg/dL — ABNORMAL HIGH (ref 0.61–1.24)
GFR, Estimated: 47 mL/min — ABNORMAL LOW
Glucose, Bld: 116 mg/dL — ABNORMAL HIGH (ref 70–99)
Potassium: 4.1 mmol/L (ref 3.5–5.1)
Sodium: 142 mmol/L (ref 135–145)

## 2024-03-31 LAB — MAGNESIUM: Magnesium: 2.1 mg/dL (ref 1.7–2.4)

## 2024-03-31 MED ORDER — SODIUM CHLORIDE 0.9 % IV BOLUS
500.0000 mL | Freq: Once | INTRAVENOUS | Status: AC
  Administered 2024-03-31: 500 mL via INTRAVENOUS

## 2024-03-31 MED ORDER — ETOMIDATE 2 MG/ML IV SOLN
0.3000 mg/kg | Freq: Once | INTRAVENOUS | Status: AC
  Administered 2024-03-31: 30.34 mg via INTRAVENOUS
  Filled 2024-03-31: qty 20

## 2024-03-31 MED ORDER — METOPROLOL TARTRATE 5 MG/5ML IV SOLN
5.0000 mg | Freq: Once | INTRAVENOUS | Status: AC
  Administered 2024-03-31: 5 mg via INTRAVENOUS
  Filled 2024-03-31: qty 5

## 2024-03-31 NOTE — ED Triage Notes (Signed)
 BIB EMS from home. Wife called d/t waxing and waning LOC.  Syncope on standing with EMS.  Has significant cardiac hx per family, tachy at 68 with EMS.  BP 134/67, CBG 139, temp 98.8.  Pt reports feeling weak and not himself

## 2024-03-31 NOTE — ED Provider Notes (Signed)
 "  [facility] Provider Note    Event Date/Time   First MD Initiated Contact with Patient 03/31/24 1401     (approximate)   History   Near Syncope   HPI  Nathaniel Thompson is a 89 y.o. male who presents to the emergency department today because of concerns for dizziness.  Patient states that he had been feeling okay yesterday.  However today he had been on the toilet and when he stood up he became very dizzy.  Felt like he was going to pass out.  He is also complaining of diffuse weakness.  Patient does state that he has a history of atrial flutter and has been shocked in the past.  He states he has been taking his medication for.  Denies any recent fevers or illness.     Physical Exam   Triage Vital Signs: ED Triage Vitals  Encounter Vitals Group     BP 03/31/24 1404 110/74     Girls Systolic BP Percentile --      Girls Diastolic BP Percentile --      Boys Systolic BP Percentile --      Boys Diastolic BP Percentile --      Pulse Rate 03/31/24 1411 (!) 126     Resp 03/31/24 1411 (!) 21     Temp 03/31/24 1412 97.8 F (36.6 C)     Temp Source 03/31/24 1412 Oral     SpO2 03/31/24 1411 95 %     Weight --      Height --      Head Circumference --      Peak Flow --      Pain Score 03/31/24 1401 0     Pain Loc --      Pain Education --      Exclude from Growth Chart --     Most recent vital signs: Vitals:   03/31/24 1411 03/31/24 1412  BP:    Pulse: (!) 126   Resp: (!) 21   Temp:  97.8 F (36.6 C)  SpO2: 95%    General: Awake, alert, oriented. CV:  Good peripheral perfusion. Tachycardia. Resp:  Normal effort. Lungs clear. Abd:  No distention. Non tender.   ED Results / Procedures / Treatments   Labs (all labs ordered are listed, but only abnormal results are displayed) Labs Reviewed  CBC WITH DIFFERENTIAL/PLATELET - Abnormal; Notable for the following components:      Result Value   RBC 3.88 (*)    Hemoglobin 11.9 (*)    HCT 34.9 (*)     All other components within normal limits  BASIC METABOLIC PANEL WITH GFR - Abnormal; Notable for the following components:   CO2 21 (*)    Glucose, Bld 116 (*)    Creatinine, Ser 1.46 (*)    GFR, Estimated 47 (*)    All other components within normal limits  MAGNESIUM   TROPONIN T, HIGH SENSITIVITY     EKG  I, Guadalupe Eagles, attending physician, personally viewed and interpreted this EKG  EKG Time: 1411 Rate: 129 Rhythm: sinus tach vs a flutter Axis: left axis deviation Intervals: qtc 523 QRS: narrow ST changes: no st elevation Impression: abnormal ekg  RADIOLOGY None   PROCEDURES:  Critical Care performed: Yes  CRITICAL CARE Performed by: Guadalupe Eagles   Total critical care time: 30 minutes  Critical care time was exclusive of separately billable procedures and treating other patients.  Critical care was necessary to treat or  prevent imminent or life-threatening deterioration.  Critical care was time spent personally by me on the following activities: development of treatment plan with patient and/or surrogate as well as nursing, discussions with consultants, evaluation of patient's response to treatment, examination of patient, obtaining history from patient or surrogate, ordering and performing treatments and interventions, ordering and review of laboratory studies, ordering and review of radiographic studies, pulse oximetry and re-evaluation of patient's condition.   Procedures    MEDICATIONS ORDERED IN ED: Medications - No data to display   IMPRESSION / MDM / ASSESSMENT AND PLAN / ED COURSE  I reviewed the triage vital signs and the nursing notes.                              Differential diagnosis includes, but is not limited to, arrhythmia, electrolyte abnormality, ACS  Patient's presentation is most consistent with acute presentation with potential threat to life or bodily function.   The patient is on the cardiac monitor to evaluate for  evidence of arrhythmia and/or significant heart rate changes.  Patient presented to the emergency department today because of concerns for weakness and dizziness.  On exam patient was noted to be tachycardic.  EKG was concerning for sinus tachycardia versus possible atrial flutter.  He was given metoprolol .  This did slow his rhythm down and revealed what appeared to be atrial flutter.  Blood work was checked without any significant troponin elevation or electrolyte abnormality.  Patient did feel some improvement after improved rate control.  Will continue to observe in the emergency department.  Will sign out to oncoming doctor.  If tachycardia resumes or patient continues to feel symptomatic do think cardioversion could be an option.      FINAL CLINICAL IMPRESSION(S) / ED DIAGNOSES   Final diagnoses:  Atrial flutter, unspecified type [facility])       Note:  This document was prepared using Dragon voice recognition software and may include unintentional dictation errors.    Floy Roberts, MD 03/31/24 [phone]  "

## 2024-03-31 NOTE — ED Provider Notes (Signed)
 Emergency department handoff note  Care of this patient was signed out to me at the end of the previous provider shift.  All pertinent patient information was conveyed and all questions were answered.  Patient pending adequate reaction to medication.  Unfortunately, patient has maintained atrial fibrillation with rapid ventricular response and patient's blood pressure has been declining since signout.  Given these worsening symptoms, patient will require a procedural sedation with cardioversion at this time.  Patient tolerated this procedure well without any complications.  Patient is awake, at his baseline, no longer complaining of palpitations or chest pain.  Patient's blood pressure also improved after cardioversion.  Patient agreed with plan for discharge at this time with outpatient cardiology follow-up.  Patient given strict return precautions and all questions were answered prior to discharge  Dispo: Discharge home with cardiology follow-up  .Sedation  Date/Time: 03/31/2024 5:33 PM  Performed by: Jossie Artist POUR, MD Authorized by: Jossie Artist POUR, MD   Consent:    Consent obtained:  Written   Risks discussed:  Allergic reaction, prolonged hypoxia resulting in organ damage, dysrhythmia, prolonged sedation necessitating reversal, inadequate sedation, respiratory compromise necessitating ventilatory assistance and intubation, nausea and vomiting   Alternatives discussed:  Analgesia without sedation, anxiolysis and regional anesthesia Universal protocol:    Immediately prior to procedure, a time out was called: yes   Indications:    Procedure performed:  Cardioversion   Procedure necessitating sedation performed by:  Physician performing sedation Pre-sedation assessment:    Time since last food or drink:  Na   NPO status caution: urgency dictates proceeding with non-ideal NPO status     ASA classification: class 2 - patient with mild systemic disease     Mallampati score:  I - soft palate,  uvula, fauces, pillars visible   Neck mobility: normal     Pre-sedation assessments completed and reviewed: airway patency, cardiovascular function, hydration status, mental status, nausea/vomiting, pain level, respiratory function and temperature   A pre-sedation assessment was completed prior to the start of the procedure Immediate pre-procedure details:    Reassessment: Patient reassessed immediately prior to procedure     Reviewed: vital signs, relevant labs/tests and NPO status     Verified: bag valve mask available, emergency equipment available, intubation equipment available, IV patency confirmed, oxygen available, reversal medications available and suction available   Procedure details (see MAR for exact dosages):    Preoxygenation:  Nasal cannula   Sedation:  Etomidate    Intended level of sedation: deep   Intra-procedure monitoring:  Blood pressure monitoring, continuous capnometry, frequent LOC assessments, frequent vital sign checks, continuous pulse oximetry and cardiac monitor   Intra-procedure events: none     Total Provider sedation time (minutes):  17 Post-procedure details:   A post-sedation assessment was completed following the completion of the procedure.   Attendance: Constant attendance by certified staff until patient recovered     Recovery: Patient returned to pre-procedure baseline     Post-sedation assessments completed and reviewed: airway patency, cardiovascular function, hydration status, mental status, nausea/vomiting, pain level, respiratory function and temperature     Patient is stable for discharge or admission: yes     Procedure completion:  Tolerated well, no immediate complications .Cardioversion  Date/Time: 03/31/2024 5:34 PM  Performed by: Jossie Artist POUR, MD Authorized by: Jossie Artist POUR, MD   Consent:    Consent obtained:  Verbal   Consent given by:  Patient   Risks discussed:  Induced arrhythmia, cutaneous burn, pain and death  Alternatives  discussed:  Rate-control medication Universal protocol:    Procedure explained and questions answered to patient or proxy's satisfaction: yes     Immediately prior to procedure a time out was called: yes     Patient identity confirmed:  Verbally with patient Pre-procedure details:    Cardioversion basis:  Emergent   Rhythm:  Atrial fibrillation   Electrode placement:  Anterior-posterior Patient sedated: Yes. Refer to sedation procedure documentation for details of sedation.  Attempt one:    Cardioversion mode:  Synchronous   Waveform:  Biphasic   Shock (Joules):  200   Shock outcome:  Conversion to normal sinus rhythm Post-procedure details:    Patient status:  Awake   Patient tolerance of procedure:  Tolerated well, no immediate complications  CRITICAL CARE Performed by: Jonika Critz K Teagen Mcleary  Total critical care time: 33 minutes  Critical care time was exclusive of separately billable procedures and treating other patients.  Critical care was necessary to treat or prevent imminent or life-threatening deterioration.  Critical care was time spent personally by me on the following activities: development of treatment plan with patient and/or surrogate as well as nursing, discussions with consultants, evaluation of patient's response to treatment, examination of patient, obtaining history from patient or surrogate, ordering and performing treatments and interventions, ordering and review of laboratory studies, ordering and review of radiographic studies, pulse oximetry and re-evaluation of patient's condition.     Jossie Artist POUR, MD 03/31/24 [phone]

## 2024-04-04 ENCOUNTER — Ambulatory Visit: Attending: Physician Assistant | Admitting: Physician Assistant

## 2024-04-04 ENCOUNTER — Encounter: Payer: Self-pay | Admitting: Physician Assistant

## 2024-04-04 VITALS — BP 120/62 | HR 77 | Ht 70.0 in | Wt 217.6 lb

## 2024-04-04 DIAGNOSIS — I724 Aneurysm of artery of lower extremity: Secondary | ICD-10-CM | POA: Diagnosis not present

## 2024-04-04 DIAGNOSIS — N183 Chronic kidney disease, stage 3 unspecified: Secondary | ICD-10-CM

## 2024-04-04 DIAGNOSIS — D6869 Other thrombophilia: Secondary | ICD-10-CM | POA: Diagnosis not present

## 2024-04-04 DIAGNOSIS — I4892 Unspecified atrial flutter: Secondary | ICD-10-CM

## 2024-04-04 DIAGNOSIS — I2510 Atherosclerotic heart disease of native coronary artery without angina pectoris: Secondary | ICD-10-CM | POA: Diagnosis not present

## 2024-04-04 DIAGNOSIS — I10 Essential (primary) hypertension: Secondary | ICD-10-CM

## 2024-04-04 DIAGNOSIS — I480 Paroxysmal atrial fibrillation: Secondary | ICD-10-CM | POA: Diagnosis not present

## 2024-04-04 DIAGNOSIS — R001 Bradycardia, unspecified: Secondary | ICD-10-CM | POA: Diagnosis not present

## 2024-04-04 DIAGNOSIS — E785 Hyperlipidemia, unspecified: Secondary | ICD-10-CM

## 2024-04-04 DIAGNOSIS — Z950 Presence of cardiac pacemaker: Secondary | ICD-10-CM | POA: Diagnosis not present

## 2024-04-04 DIAGNOSIS — I714 Abdominal aortic aneurysm, without rupture, unspecified: Secondary | ICD-10-CM | POA: Diagnosis not present

## 2024-04-04 DIAGNOSIS — Z79899 Other long term (current) drug therapy: Secondary | ICD-10-CM

## 2024-04-04 DIAGNOSIS — I6523 Occlusion and stenosis of bilateral carotid arteries: Secondary | ICD-10-CM

## 2024-04-04 DIAGNOSIS — I5032 Chronic diastolic (congestive) heart failure: Secondary | ICD-10-CM | POA: Diagnosis not present

## 2024-04-04 MED ORDER — AMIODARONE HCL 200 MG PO TABS: ORAL_TABLET | ORAL | 3 refills | Status: AC

## 2024-04-04 NOTE — Patient Instructions (Addendum)
 Medication Instructions:  Your physician recommends the following medication changes.  STOP TAKING: Multaq  for 4 days THEN  START TAKING on April 09, 2024: Amiodarone  200 mg twice daily for 2 weeks THEN  DECREASE: Amiodarone  200 mg once daily  *If you need a refill on your cardiac medications before your next appointment, please call your pharmacy*  Lab Work: Your provider would like for you to return in 1 week to have the following labs drawn: BMP.   Please go to [facility] [address] ([facility] [address], [place] [address] You do not need an appointment.  They are open from 8 am- 4:30 pm.  Lunch from 1:00 pm- 2:00 pm You DO NOT need to be fasting.   You may also go to one of the following [facility]:  [address] [place], [place] [address] Phone: [phone] Lab hours: Mon-Fri 8 am- 5 pm    Lunch 12 pm- 1 pm  [address] [place],  [place]  [address]  [place]  Phone: [phone] Lab hours: 7 am- 4 pm Lunch 12 pm-1 pm   [address] [place],  [place]  [address]  [place]  Phone: [phone] Lab hours: Mon-Fri 8 am- 5 pm    Lunch 12 pm- 1 pm  If you have labs (blood work) drawn today and your tests are completely normal, you will receive your results only by: MyChart Message (if you have MyChart) OR A paper copy in the mail If you have any lab test that is abnormal or we need to change your treatment, we will call you to review the results.  Follow-Up: At [facility], you and your health needs are our priority.  As part of our continuing mission to provide you with exceptional heart care, our providers are all part of one team.  This team includes your primary Cardiologist (physician) and Advanced Practice Providers or APPs (Physician Assistants and Nurse Practitioners) who all work together to provide you with the care you need, when you need it.  Your next appointment:   2 month(s)  Provider:   You may see Bernardino Bring, PA-C  Keep  follow-up with Suzann

## 2024-04-08 ENCOUNTER — Ambulatory Visit

## 2024-04-09 LAB — CUP PACEART REMOTE DEVICE CHECK
Battery Remaining Longevity: 124 mo
Battery Voltage: 3.02 V
Brady Statistic AP VP Percent: 0.15 %
Brady Statistic AP VS Percent: 93.95 %
Brady Statistic AS VP Percent: 0.02 %
Brady Statistic AS VS Percent: 5.88 %
Brady Statistic RA Percent Paced: 93.59 %
Brady Statistic RV Percent Paced: 0.22 %
Date Time Interrogation Session: 20260203012917
Implantable Lead Connection Status: 753985
Implantable Lead Connection Status: 753985
Implantable Lead Implant Date: 20230130
Implantable Lead Implant Date: 20230130
Implantable Lead Location: 753859
Implantable Lead Location: 753860
Implantable Lead Model: 3830
Implantable Lead Model: 5076
Implantable Pulse Generator Implant Date: 20230130
Lead Channel Impedance Value: 323 Ohm
Lead Channel Impedance Value: 342 Ohm
Lead Channel Impedance Value: 418 Ohm
Lead Channel Impedance Value: 570 Ohm
Lead Channel Pacing Threshold Amplitude: 0.625 V
Lead Channel Pacing Threshold Amplitude: 0.875 V
Lead Channel Pacing Threshold Pulse Width: 0.4 ms
Lead Channel Pacing Threshold Pulse Width: 0.4 ms
Lead Channel Sensing Intrinsic Amplitude: 2.625 mV
Lead Channel Sensing Intrinsic Amplitude: 2.625 mV
Lead Channel Sensing Intrinsic Amplitude: 2.625 mV
Lead Channel Sensing Intrinsic Amplitude: 2.625 mV
Lead Channel Setting Pacing Amplitude: 1.5 V
Lead Channel Setting Pacing Amplitude: 2 V
Lead Channel Setting Pacing Pulse Width: 0.4 ms
Lead Channel Setting Sensing Sensitivity: 0.9 mV
Zone Setting Status: 755011

## 2024-04-28 ENCOUNTER — Ambulatory Visit: Admitting: Cardiology

## 2024-05-12 ENCOUNTER — Inpatient Hospital Stay

## 2024-05-14 ENCOUNTER — Inpatient Hospital Stay

## 2024-05-14 ENCOUNTER — Inpatient Hospital Stay: Admitting: Internal Medicine

## 2024-06-10 ENCOUNTER — Ambulatory Visit: Admitting: Physician Assistant

## 2024-07-08 ENCOUNTER — Ambulatory Visit

## 2024-07-24 ENCOUNTER — Ambulatory Visit: Admitting: Pulmonary Disease

## 2024-07-25 ENCOUNTER — Ambulatory Visit: Admitting: Physician Assistant

## 2024-10-07 ENCOUNTER — Ambulatory Visit

## 2024-11-04 ENCOUNTER — Ambulatory Visit

## 2025-01-19 ENCOUNTER — Ambulatory Visit (INDEPENDENT_AMBULATORY_CARE_PROVIDER_SITE_OTHER): Admitting: Vascular Surgery

## 2025-01-19 ENCOUNTER — Encounter (INDEPENDENT_AMBULATORY_CARE_PROVIDER_SITE_OTHER)
